# Patient Record
Sex: Male | Born: 1935 | Race: White | Hispanic: No | Smoking: Former smoker
Health system: Southern US, Community
[De-identification: ages and names within clinical notes are randomized; demographics above are authoritative.]

## PROBLEM LIST (undated history)

## (undated) DIAGNOSIS — Z9289 Personal history of other medical treatment: Secondary | ICD-10-CM

## (undated) DIAGNOSIS — M109 Gout, unspecified: Secondary | ICD-10-CM

## (undated) DIAGNOSIS — J189 Pneumonia, unspecified organism: Secondary | ICD-10-CM

## (undated) DIAGNOSIS — I509 Heart failure, unspecified: Secondary | ICD-10-CM

## (undated) DIAGNOSIS — L309 Dermatitis, unspecified: Secondary | ICD-10-CM

## (undated) DIAGNOSIS — M1990 Unspecified osteoarthritis, unspecified site: Secondary | ICD-10-CM

## (undated) DIAGNOSIS — D649 Anemia, unspecified: Secondary | ICD-10-CM

## (undated) DIAGNOSIS — E7800 Pure hypercholesterolemia, unspecified: Secondary | ICD-10-CM

## (undated) DIAGNOSIS — Z860100 Personal history of colon polyps, unspecified: Secondary | ICD-10-CM

## (undated) DIAGNOSIS — I82409 Acute embolism and thrombosis of unspecified deep veins of unspecified lower extremity: Secondary | ICD-10-CM

## (undated) DIAGNOSIS — E559 Vitamin D deficiency, unspecified: Secondary | ICD-10-CM

## (undated) DIAGNOSIS — R351 Nocturia: Secondary | ICD-10-CM

## (undated) DIAGNOSIS — R001 Bradycardia, unspecified: Secondary | ICD-10-CM

## (undated) DIAGNOSIS — I81 Portal vein thrombosis: Secondary | ICD-10-CM

## (undated) DIAGNOSIS — R0989 Other specified symptoms and signs involving the circulatory and respiratory systems: Secondary | ICD-10-CM

## (undated) DIAGNOSIS — M170 Bilateral primary osteoarthritis of knee: Secondary | ICD-10-CM

## (undated) DIAGNOSIS — Z87438 Personal history of other diseases of male genital organs: Secondary | ICD-10-CM

## (undated) DIAGNOSIS — I776 Arteritis, unspecified: Secondary | ICD-10-CM

## (undated) DIAGNOSIS — A809 Acute poliomyelitis, unspecified: Secondary | ICD-10-CM

## (undated) DIAGNOSIS — N200 Calculus of kidney: Secondary | ICD-10-CM

## (undated) DIAGNOSIS — Z8639 Personal history of other endocrine, nutritional and metabolic disease: Secondary | ICD-10-CM

## (undated) DIAGNOSIS — D509 Iron deficiency anemia, unspecified: Secondary | ICD-10-CM

## (undated) DIAGNOSIS — IMO0001 Reserved for inherently not codable concepts without codable children: Secondary | ICD-10-CM

## (undated) DIAGNOSIS — J159 Unspecified bacterial pneumonia: Secondary | ICD-10-CM

## (undated) DIAGNOSIS — I809 Phlebitis and thrombophlebitis of unspecified site: Secondary | ICD-10-CM

## (undated) DIAGNOSIS — I10 Essential (primary) hypertension: Secondary | ICD-10-CM

## (undated) DIAGNOSIS — R161 Splenomegaly, not elsewhere classified: Secondary | ICD-10-CM

## (undated) DIAGNOSIS — G629 Polyneuropathy, unspecified: Secondary | ICD-10-CM

## (undated) DIAGNOSIS — Z8719 Personal history of other diseases of the digestive system: Secondary | ICD-10-CM

## (undated) DIAGNOSIS — C170 Malignant neoplasm of duodenum: Secondary | ICD-10-CM

## (undated) DIAGNOSIS — R3915 Urgency of urination: Secondary | ICD-10-CM

## (undated) DIAGNOSIS — D693 Immune thrombocytopenic purpura: Secondary | ICD-10-CM

## (undated) DIAGNOSIS — R269 Unspecified abnormalities of gait and mobility: Secondary | ICD-10-CM

## (undated) DIAGNOSIS — M545 Low back pain, unspecified: Secondary | ICD-10-CM

## (undated) DIAGNOSIS — G8929 Other chronic pain: Secondary | ICD-10-CM

## (undated) DIAGNOSIS — K219 Gastro-esophageal reflux disease without esophagitis: Secondary | ICD-10-CM

## (undated) DIAGNOSIS — C4320 Malignant melanoma of unspecified ear and external auricular canal: Secondary | ICD-10-CM

## (undated) DIAGNOSIS — N323 Diverticulum of bladder: Secondary | ICD-10-CM

## (undated) DIAGNOSIS — R233 Spontaneous ecchymoses: Secondary | ICD-10-CM

## (undated) DIAGNOSIS — R32 Unspecified urinary incontinence: Secondary | ICD-10-CM

## (undated) DIAGNOSIS — N451 Epididymitis: Secondary | ICD-10-CM

## (undated) DIAGNOSIS — R238 Other skin changes: Secondary | ICD-10-CM

## (undated) DIAGNOSIS — I8001 Phlebitis and thrombophlebitis of superficial vessels of right lower extremity: Secondary | ICD-10-CM

## (undated) DIAGNOSIS — R0602 Shortness of breath: Secondary | ICD-10-CM

## (undated) DIAGNOSIS — R350 Frequency of micturition: Secondary | ICD-10-CM

## (undated) DIAGNOSIS — K068 Other specified disorders of gingiva and edentulous alveolar ridge: Secondary | ICD-10-CM

## (undated) DIAGNOSIS — I8500 Esophageal varices without bleeding: Secondary | ICD-10-CM

## (undated) HISTORY — DX: Personal history of other endocrine, nutritional and metabolic disease: Z86.39

## (undated) HISTORY — DX: Morbid (severe) obesity due to excess calories: E66.01

## (undated) HISTORY — DX: Vitamin D deficiency, unspecified: E55.9

## (undated) HISTORY — DX: Low back pain, unspecified: M54.50

## (undated) HISTORY — DX: Personal history of colonic polyps: Z86.010

## (undated) HISTORY — DX: Other specified disorders of gingiva and edentulous alveolar ridge: K06.8

## (undated) HISTORY — DX: Malignant neoplasm of duodenum: C17.0

## (undated) HISTORY — PX: CATARACT EXTRACTION W/ INTRAOCULAR LENS  IMPLANT, BILATERAL: SHX1307

## (undated) HISTORY — DX: Bradycardia, unspecified: R00.1

## (undated) HISTORY — DX: Personal history of colon polyps, unspecified: Z86.0100

## (undated) HISTORY — DX: Other specified symptoms and signs involving the circulatory and respiratory systems: R09.89

## (undated) HISTORY — DX: Reserved for inherently not codable concepts without codable children: IMO0001

## (undated) HISTORY — DX: Phlebitis and thrombophlebitis of unspecified site: I80.9

## (undated) HISTORY — PX: OTHER SURGICAL HISTORY: SHX169

## (undated) HISTORY — DX: Bilateral primary osteoarthritis of knee: M17.0

## (undated) HISTORY — DX: Arteritis, unspecified: I77.6

## (undated) HISTORY — DX: Epididymitis: N45.1

## (undated) HISTORY — DX: Unspecified abnormalities of gait and mobility: R26.9

## (undated) HISTORY — DX: Essential (primary) hypertension: I10

## (undated) HISTORY — DX: Pure hypercholesterolemia, unspecified: E78.00

## (undated) HISTORY — DX: Unspecified bacterial pneumonia: J15.9

## (undated) HISTORY — DX: Dermatitis, unspecified: L30.9

## (undated) HISTORY — DX: Personal history of other diseases of male genital organs: Z87.438

## (undated) HISTORY — DX: Anemia, unspecified: D64.9

## (undated) HISTORY — DX: Diverticulum of bladder: N32.3

## (undated) HISTORY — DX: Splenomegaly, not elsewhere classified: R16.1

## (undated) HISTORY — DX: Acute embolism and thrombosis of unspecified deep veins of unspecified lower extremity: I82.409

## (undated) HISTORY — DX: Other chronic pain: G89.29

## (undated) HISTORY — DX: Calculus of kidney: N20.0

## (undated) HISTORY — DX: Immune thrombocytopenic purpura: D69.3

## (undated) HISTORY — PX: COLONOSCOPY: SHX174

## (undated) HISTORY — DX: Gastro-esophageal reflux disease without esophagitis: K21.9

## (undated) HISTORY — DX: Low back pain: M54.5

## (undated) HISTORY — PX: SURGERY SCROTAL / TESTICULAR: SUR1316

## (undated) HISTORY — DX: Malignant melanoma of unspecified ear and external auricular canal: C43.20

---
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
## 1957-12-23 HISTORY — PX: INGUINAL HERNIA REPAIR: SUR1180

## 1969-08-23 HISTORY — PX: INGUINAL HERNIA REPAIR: SUR1180

## 1975-12-24 HISTORY — PX: OTHER SURGICAL HISTORY: SHX169

## 1994-12-23 HISTORY — PX: OTHER SURGICAL HISTORY: SHX169

## 1997-08-24 ENCOUNTER — Encounter: Payer: Self-pay | Admitting: Family Medicine

## 1998-03-23 HISTORY — PX: OTHER SURGICAL HISTORY: SHX169

## 1998-11-22 ENCOUNTER — Encounter: Payer: Self-pay | Admitting: Family Medicine

## 1999-03-24 HISTORY — PX: OTHER SURGICAL HISTORY: SHX169

## 1999-08-24 ENCOUNTER — Encounter: Payer: Self-pay | Admitting: Family Medicine

## 1999-08-24 LAB — CONVERTED CEMR LAB: PSA: 2.4 ng/mL

## 2000-02-07 ENCOUNTER — Encounter: Admission: RE | Admit: 2000-02-07 | Discharge: 2000-02-07 | Payer: Self-pay | Admitting: Surgery

## 2000-02-07 ENCOUNTER — Encounter: Payer: Self-pay | Admitting: Surgery

## 2001-01-23 ENCOUNTER — Encounter: Payer: Self-pay | Admitting: Family Medicine

## 2002-02-20 ENCOUNTER — Encounter: Payer: Self-pay | Admitting: Family Medicine

## 2003-02-21 ENCOUNTER — Encounter: Payer: Self-pay | Admitting: Family Medicine

## 2003-02-21 LAB — CONVERTED CEMR LAB: PSA: 1.7 ng/mL

## 2003-05-01 ENCOUNTER — Emergency Department (HOSPITAL_COMMUNITY): Admission: EM | Admit: 2003-05-01 | Discharge: 2003-05-01 | Payer: Self-pay | Admitting: Emergency Medicine

## 2003-05-24 HISTORY — PX: MELANOMA EXCISION: SHX5266

## 2004-03-23 ENCOUNTER — Encounter: Payer: Self-pay | Admitting: Family Medicine

## 2004-03-23 LAB — CONVERTED CEMR LAB: PSA: 1.3 ng/mL

## 2004-04-23 ENCOUNTER — Encounter: Payer: Self-pay | Admitting: Gastroenterology

## 2004-05-11 HISTORY — PX: OTHER SURGICAL HISTORY: SHX169

## 2004-05-23 ENCOUNTER — Ambulatory Visit (HOSPITAL_COMMUNITY): Admission: RE | Admit: 2004-05-23 | Discharge: 2004-05-23 | Payer: Self-pay | Admitting: *Deleted

## 2004-05-23 HISTORY — PX: CARDIAC CATHETERIZATION: SHX172

## 2004-06-21 ENCOUNTER — Ambulatory Visit: Admission: RE | Admit: 2004-06-21 | Discharge: 2004-06-21 | Payer: Self-pay | Admitting: Pulmonary Disease

## 2004-11-01 ENCOUNTER — Ambulatory Visit: Payer: Self-pay | Admitting: Pulmonary Disease

## 2004-11-20 ENCOUNTER — Ambulatory Visit: Payer: Self-pay | Admitting: Cardiology

## 2004-12-11 ENCOUNTER — Ambulatory Visit: Payer: Self-pay | Admitting: Pulmonary Disease

## 2004-12-18 ENCOUNTER — Ambulatory Visit: Payer: Self-pay | Admitting: Family Medicine

## 2004-12-20 ENCOUNTER — Ambulatory Visit: Payer: Self-pay | Admitting: Family Medicine

## 2005-03-12 ENCOUNTER — Ambulatory Visit: Payer: Self-pay | Admitting: Pulmonary Disease

## 2005-03-25 ENCOUNTER — Ambulatory Visit: Payer: Self-pay | Admitting: Family Medicine

## 2005-04-22 ENCOUNTER — Encounter: Payer: Self-pay | Admitting: Family Medicine

## 2005-04-22 LAB — CONVERTED CEMR LAB: PSA: 2.12 ng/mL

## 2005-05-01 ENCOUNTER — Ambulatory Visit: Payer: Self-pay | Admitting: Family Medicine

## 2005-05-03 ENCOUNTER — Ambulatory Visit: Payer: Self-pay | Admitting: Family Medicine

## 2005-05-21 ENCOUNTER — Ambulatory Visit: Payer: Self-pay | Admitting: Family Medicine

## 2005-05-21 ENCOUNTER — Ambulatory Visit (HOSPITAL_COMMUNITY): Admission: RE | Admit: 2005-05-21 | Discharge: 2005-05-21 | Payer: Self-pay | Admitting: Family Medicine

## 2005-05-23 ENCOUNTER — Ambulatory Visit (HOSPITAL_COMMUNITY): Admission: RE | Admit: 2005-05-23 | Discharge: 2005-05-23 | Payer: Self-pay | Admitting: Internal Medicine

## 2005-05-28 ENCOUNTER — Ambulatory Visit: Payer: Self-pay | Admitting: Family Medicine

## 2005-06-17 HISTORY — PX: SHOULDER SURGERY: SHX246

## 2005-07-26 ENCOUNTER — Ambulatory Visit: Payer: Self-pay | Admitting: Family Medicine

## 2005-07-31 ENCOUNTER — Ambulatory Visit: Payer: Self-pay | Admitting: Family Medicine

## 2005-10-18 ENCOUNTER — Ambulatory Visit: Payer: Self-pay | Admitting: Family Medicine

## 2005-11-01 ENCOUNTER — Ambulatory Visit: Payer: Self-pay | Admitting: Family Medicine

## 2005-11-04 ENCOUNTER — Ambulatory Visit: Payer: Self-pay | Admitting: Cardiology

## 2006-04-28 ENCOUNTER — Ambulatory Visit: Payer: Self-pay | Admitting: Family Medicine

## 2006-04-28 LAB — CONVERTED CEMR LAB: PSA: 2.46 ng/mL

## 2006-05-02 ENCOUNTER — Ambulatory Visit: Payer: Self-pay | Admitting: Family Medicine

## 2006-05-13 LAB — FECAL OCCULT BLOOD, GUAIAC: Fecal Occult Blood: NEGATIVE

## 2006-05-20 ENCOUNTER — Ambulatory Visit: Payer: Self-pay | Admitting: Family Medicine

## 2006-06-13 ENCOUNTER — Ambulatory Visit: Payer: Self-pay | Admitting: Family Medicine

## 2006-11-03 ENCOUNTER — Ambulatory Visit: Payer: Self-pay | Admitting: Family Medicine

## 2006-11-18 ENCOUNTER — Ambulatory Visit: Payer: Self-pay | Admitting: Cardiology

## 2006-11-26 ENCOUNTER — Ambulatory Visit: Payer: Self-pay

## 2007-04-27 ENCOUNTER — Ambulatory Visit: Payer: Self-pay | Admitting: Family Medicine

## 2007-04-27 DIAGNOSIS — I10 Essential (primary) hypertension: Secondary | ICD-10-CM

## 2007-04-27 LAB — CONVERTED CEMR LAB
ALT: 24 units/L (ref 0–40)
AST: 28 units/L (ref 0–37)
Calcium: 9.1 mg/dL (ref 8.4–10.5)
GFR calc Af Amer: 106 mL/min
HCT: 41.4 % (ref 39.0–52.0)
MCHC: 34.4 g/dL (ref 30.0–36.0)
PSA: 2.41 ng/mL (ref 0.10–4.00)
Platelets: 245 10*3/uL (ref 150–400)
Sodium: 139 meq/L (ref 135–145)
WBC: 6 10*3/uL (ref 4.5–10.5)

## 2007-04-28 ENCOUNTER — Encounter: Payer: Self-pay | Admitting: Family Medicine

## 2007-04-28 DIAGNOSIS — Z862 Personal history of diseases of the blood and blood-forming organs and certain disorders involving the immune mechanism: Secondary | ICD-10-CM | POA: Insufficient documentation

## 2007-04-28 DIAGNOSIS — Z87898 Personal history of other specified conditions: Secondary | ICD-10-CM

## 2007-04-28 DIAGNOSIS — Z8639 Personal history of other endocrine, nutritional and metabolic disease: Secondary | ICD-10-CM

## 2007-05-01 DIAGNOSIS — N200 Calculus of kidney: Secondary | ICD-10-CM

## 2007-05-06 ENCOUNTER — Ambulatory Visit: Payer: Self-pay | Admitting: Family Medicine

## 2007-05-06 DIAGNOSIS — F411 Generalized anxiety disorder: Secondary | ICD-10-CM | POA: Insufficient documentation

## 2007-05-25 ENCOUNTER — Ambulatory Visit: Payer: Self-pay | Admitting: Family Medicine

## 2007-05-26 ENCOUNTER — Encounter (INDEPENDENT_AMBULATORY_CARE_PROVIDER_SITE_OTHER): Payer: Self-pay | Admitting: *Deleted

## 2007-06-19 ENCOUNTER — Encounter: Payer: Self-pay | Admitting: Family Medicine

## 2007-06-19 DIAGNOSIS — M1710 Unilateral primary osteoarthritis, unspecified knee: Secondary | ICD-10-CM | POA: Insufficient documentation

## 2007-06-19 DIAGNOSIS — IMO0002 Reserved for concepts with insufficient information to code with codable children: Secondary | ICD-10-CM | POA: Insufficient documentation

## 2007-06-20 ENCOUNTER — Encounter: Payer: Self-pay | Admitting: Family Medicine

## 2007-06-23 ENCOUNTER — Encounter: Admission: RE | Admit: 2007-06-23 | Discharge: 2007-06-23 | Payer: Self-pay

## 2007-06-30 ENCOUNTER — Encounter: Payer: Self-pay | Admitting: Family Medicine

## 2007-07-02 DIAGNOSIS — N323 Diverticulum of bladder: Secondary | ICD-10-CM

## 2007-07-02 DIAGNOSIS — E559 Vitamin D deficiency, unspecified: Secondary | ICD-10-CM | POA: Insufficient documentation

## 2007-07-27 ENCOUNTER — Encounter: Payer: Self-pay | Admitting: Family Medicine

## 2007-08-12 ENCOUNTER — Ambulatory Visit: Payer: Self-pay | Admitting: Cardiology

## 2007-08-12 ENCOUNTER — Ambulatory Visit: Payer: Self-pay | Admitting: Family Medicine

## 2007-08-12 ENCOUNTER — Telehealth: Payer: Self-pay | Admitting: Family Medicine

## 2007-08-12 ENCOUNTER — Encounter: Admission: RE | Admit: 2007-08-12 | Discharge: 2007-08-12 | Payer: Self-pay | Admitting: Family Medicine

## 2007-08-12 ENCOUNTER — Telehealth (INDEPENDENT_AMBULATORY_CARE_PROVIDER_SITE_OTHER): Payer: Self-pay | Admitting: *Deleted

## 2007-08-12 DIAGNOSIS — IMO0001 Reserved for inherently not codable concepts without codable children: Secondary | ICD-10-CM

## 2007-08-12 LAB — CONVERTED CEMR LAB
BUN: 11 mg/dL (ref 6–23)
Basophils Absolute: 0 10*3/uL (ref 0.0–0.1)
Basophils Relative: 0 % (ref 0.0–1.0)
Calcium: 9.5 mg/dL (ref 8.4–10.5)
Creatinine, Ser: 1 mg/dL (ref 0.4–1.5)
Eosinophils Absolute: 0 10*3/uL (ref 0.0–0.6)
Eosinophils Relative: 0.1 % (ref 0.0–5.0)
GFR calc non Af Amer: 77 mL/min
Glucose, Bld: 122 mg/dL — ABNORMAL HIGH (ref 70–99)
HCT: 40.2 % (ref 39.0–52.0)
Hemoglobin: 14.2 g/dL (ref 13.0–17.0)
Lymphocytes Relative: 8.2 % — ABNORMAL LOW (ref 12.0–46.0)
MCHC: 35.3 g/dL (ref 30.0–36.0)
MCV: 88.5 fL (ref 78.0–100.0)
Monocytes Relative: 11.4 % — ABNORMAL HIGH (ref 3.0–11.0)
Platelets: 242 10*3/uL (ref 150–400)
Potassium: 4.1 meq/L (ref 3.5–5.1)
Sodium: 139 meq/L (ref 135–145)
WBC: 16.9 10*3/uL — ABNORMAL HIGH (ref 4.5–10.5)

## 2007-08-17 ENCOUNTER — Ambulatory Visit: Payer: Self-pay | Admitting: Family Medicine

## 2007-08-24 HISTORY — PX: BRONCHOSCOPY: SUR163

## 2007-08-28 ENCOUNTER — Telehealth (INDEPENDENT_AMBULATORY_CARE_PROVIDER_SITE_OTHER): Payer: Self-pay | Admitting: *Deleted

## 2007-08-29 ENCOUNTER — Ambulatory Visit: Payer: Self-pay | Admitting: Family Medicine

## 2007-08-31 ENCOUNTER — Ambulatory Visit: Payer: Self-pay | Admitting: Family Medicine

## 2007-08-31 LAB — CONVERTED CEMR LAB
AST: 16 units/L (ref 0–37)
Basophils Absolute: 0 10*3/uL (ref 0.0–0.1)
Basophils Relative: 0.6 % (ref 0.0–1.0)
Eosinophils Relative: 1.9 % (ref 0.0–5.0)
Hemoglobin: 14.1 g/dL (ref 13.0–17.0)
MCV: 92.5 fL (ref 78.0–100.0)
Monocytes Relative: 11.7 % — ABNORMAL HIGH (ref 3.0–11.0)
RBC: 4.49 M/uL (ref 4.22–5.81)
RDW: 13 % (ref 11.5–14.6)

## 2007-09-01 ENCOUNTER — Ambulatory Visit: Payer: Self-pay | Admitting: Critical Care Medicine

## 2007-09-03 ENCOUNTER — Ambulatory Visit: Admission: RE | Admit: 2007-09-03 | Discharge: 2007-09-03 | Payer: Self-pay | Admitting: Critical Care Medicine

## 2007-09-03 ENCOUNTER — Ambulatory Visit: Payer: Self-pay | Admitting: Critical Care Medicine

## 2007-09-03 ENCOUNTER — Ambulatory Visit: Payer: Self-pay | Admitting: Internal Medicine

## 2007-09-03 ENCOUNTER — Encounter: Payer: Self-pay | Admitting: Family Medicine

## 2007-09-03 ENCOUNTER — Encounter: Payer: Self-pay | Admitting: Critical Care Medicine

## 2007-09-07 ENCOUNTER — Ambulatory Visit: Payer: Self-pay | Admitting: Family Medicine

## 2007-09-07 ENCOUNTER — Telehealth (INDEPENDENT_AMBULATORY_CARE_PROVIDER_SITE_OTHER): Payer: Self-pay | Admitting: *Deleted

## 2007-09-15 ENCOUNTER — Ambulatory Visit: Payer: Self-pay | Admitting: Critical Care Medicine

## 2007-10-12 ENCOUNTER — Ambulatory Visit: Payer: Self-pay | Admitting: Critical Care Medicine

## 2007-11-10 ENCOUNTER — Ambulatory Visit: Payer: Self-pay | Admitting: Cardiology

## 2008-01-25 ENCOUNTER — Telehealth (INDEPENDENT_AMBULATORY_CARE_PROVIDER_SITE_OTHER): Payer: Self-pay | Admitting: *Deleted

## 2008-06-22 ENCOUNTER — Encounter: Payer: Self-pay | Admitting: Family Medicine

## 2008-08-18 ENCOUNTER — Ambulatory Visit: Payer: Self-pay | Admitting: Family Medicine

## 2008-08-22 ENCOUNTER — Ambulatory Visit: Payer: Self-pay | Admitting: Family Medicine

## 2008-08-22 LAB — CONVERTED CEMR LAB
Alkaline Phosphatase: 71 units/L (ref 39–117)
BUN: 12 mg/dL (ref 6–23)
Chloride: 109 meq/L (ref 96–112)
Direct LDL: 65.1 mg/dL
GFR calc non Af Amer: 87 mL/min
HCT: 40 % (ref 39.0–52.0)
Hemoglobin: 13.9 g/dL (ref 13.0–17.0)
Lymphocytes Relative: 23.2 % (ref 12.0–46.0)
MCV: 91.3 fL (ref 78.0–100.0)
Monocytes Absolute: 0.8 10*3/uL (ref 0.1–1.0)
Neutrophils Relative %: 64.2 % (ref 43.0–77.0)
PSA: 2.46 ng/mL (ref 0.10–4.00)
Platelets: 215 10*3/uL (ref 150–400)
Potassium: 3.8 meq/L (ref 3.5–5.1)
RBC: 4.38 M/uL (ref 4.22–5.81)
TSH: 3.96 microintl units/mL (ref 0.35–5.50)
WBC: 7.6 10*3/uL (ref 4.5–10.5)

## 2008-09-27 ENCOUNTER — Ambulatory Visit: Payer: Self-pay | Admitting: Family Medicine

## 2008-09-28 LAB — CONVERTED CEMR LAB
ALT: 22 units/L (ref 0–53)
AST: 25 units/L (ref 0–37)

## 2008-10-11 ENCOUNTER — Ambulatory Visit: Payer: Self-pay | Admitting: Internal Medicine

## 2008-10-11 ENCOUNTER — Ambulatory Visit: Payer: Self-pay | Admitting: Critical Care Medicine

## 2008-10-25 ENCOUNTER — Encounter: Payer: Self-pay | Admitting: Adult Health

## 2008-10-26 ENCOUNTER — Ambulatory Visit: Payer: Self-pay | Admitting: Cardiology

## 2008-11-09 ENCOUNTER — Ambulatory Visit: Payer: Self-pay | Admitting: Critical Care Medicine

## 2008-11-23 ENCOUNTER — Ambulatory Visit: Payer: Self-pay

## 2008-11-23 ENCOUNTER — Encounter: Payer: Self-pay | Admitting: Family Medicine

## 2008-12-07 ENCOUNTER — Ambulatory Visit: Payer: Self-pay | Admitting: Critical Care Medicine

## 2008-12-12 ENCOUNTER — Encounter: Admission: RE | Admit: 2008-12-12 | Discharge: 2008-12-12 | Payer: Self-pay | Admitting: Neurology

## 2009-01-30 ENCOUNTER — Ambulatory Visit: Payer: Self-pay | Admitting: Family Medicine

## 2009-02-08 ENCOUNTER — Encounter: Payer: Self-pay | Admitting: Family Medicine

## 2009-02-15 ENCOUNTER — Encounter: Payer: Self-pay | Admitting: Family Medicine

## 2009-02-15 DIAGNOSIS — M545 Low back pain: Secondary | ICD-10-CM

## 2009-02-21 ENCOUNTER — Ambulatory Visit: Payer: Self-pay | Admitting: Family Medicine

## 2009-02-27 ENCOUNTER — Encounter: Payer: Self-pay | Admitting: Family Medicine

## 2009-02-27 LAB — CONVERTED CEMR LAB
Direct LDL: 57 mg/dL
LDL Cholesterol: 61 mg/dL (ref 0–99)
Total CHOL/HDL Ratio: 3.1
Triglycerides: 123 mg/dL (ref 0–149)
VLDL: 25 mg/dL (ref 0–40)

## 2009-02-28 ENCOUNTER — Ambulatory Visit: Payer: Self-pay | Admitting: Family Medicine

## 2009-03-07 ENCOUNTER — Encounter (INDEPENDENT_AMBULATORY_CARE_PROVIDER_SITE_OTHER): Payer: Self-pay | Admitting: Orthopedic Surgery

## 2009-03-07 ENCOUNTER — Ambulatory Visit (HOSPITAL_BASED_OUTPATIENT_CLINIC_OR_DEPARTMENT_OTHER): Admission: RE | Admit: 2009-03-07 | Discharge: 2009-03-07 | Payer: Self-pay | Admitting: Orthopedic Surgery

## 2009-03-07 HISTORY — PX: CYST EXCISION: SHX5701

## 2009-03-14 ENCOUNTER — Encounter: Payer: Self-pay | Admitting: Family Medicine

## 2009-03-21 ENCOUNTER — Encounter: Payer: Self-pay | Admitting: Family Medicine

## 2009-03-30 ENCOUNTER — Encounter: Payer: Self-pay | Admitting: Family Medicine

## 2009-04-10 ENCOUNTER — Encounter: Admission: RE | Admit: 2009-04-10 | Discharge: 2009-04-10 | Payer: Self-pay | Admitting: Neurology

## 2009-04-11 ENCOUNTER — Encounter: Payer: Self-pay | Admitting: Family Medicine

## 2009-04-27 ENCOUNTER — Encounter: Payer: Self-pay | Admitting: Family Medicine

## 2009-05-05 ENCOUNTER — Encounter: Payer: Self-pay | Admitting: Family Medicine

## 2009-05-15 ENCOUNTER — Ambulatory Visit: Payer: Self-pay | Admitting: Internal Medicine

## 2009-05-15 ENCOUNTER — Inpatient Hospital Stay (HOSPITAL_COMMUNITY): Admission: EM | Admit: 2009-05-15 | Discharge: 2009-05-18 | Payer: Self-pay | Admitting: Emergency Medicine

## 2009-05-15 ENCOUNTER — Ambulatory Visit: Payer: Self-pay | Admitting: Oncology

## 2009-05-15 ENCOUNTER — Ambulatory Visit: Payer: Self-pay | Admitting: Family Medicine

## 2009-05-15 DIAGNOSIS — I776 Arteritis, unspecified: Secondary | ICD-10-CM

## 2009-05-15 DIAGNOSIS — R161 Splenomegaly, not elsewhere classified: Secondary | ICD-10-CM

## 2009-05-15 DIAGNOSIS — K055 Other periodontal diseases: Secondary | ICD-10-CM

## 2009-05-15 HISTORY — DX: Splenomegaly, not elsewhere classified: R16.1

## 2009-05-15 LAB — CONVERTED CEMR LAB
ANA Titer 1: 1:40 {titer} — ABNORMAL HIGH
Albumin: 3.7 g/dL (ref 3.5–5.2)
Alkaline Phosphatase: 72 units/L (ref 39–117)
Anti Nuclear Antibody(ANA): POSITIVE — AB
BUN: 13 mg/dL (ref 6–23)
CO2: 29 meq/L (ref 19–32)
Chloride: 105 meq/L (ref 96–112)
Creatinine, Ser: 1 mg/dL (ref 0.4–1.5)
Eosinophils Absolute: 0.1 10*3/uL (ref 0.0–0.7)
Eosinophils Relative: 2 % (ref 0.0–5.0)
Glucose, Bld: 108 mg/dL — ABNORMAL HIGH (ref 70–99)
Lymphs Abs: 1.6 10*3/uL (ref 0.7–4.0)
MCHC: 34.3 g/dL (ref 30.0–36.0)
Monocytes Relative: 17.9 % — ABNORMAL HIGH (ref 3.0–12.0)
Neutro Abs: 3.1 10*3/uL (ref 1.4–7.7)
Phosphorus: 3.2 mg/dL (ref 2.3–4.6)
RBC: 4.54 M/uL (ref 4.22–5.81)
RDW: 14 % (ref 11.5–14.6)
Sed Rate: 11 mm/hr (ref 0–22)
Total Bilirubin: 0.7 mg/dL (ref 0.3–1.2)
WBC: 5.8 10*3/uL (ref 4.5–10.5)

## 2009-05-18 ENCOUNTER — Ambulatory Visit: Payer: Self-pay | Admitting: Oncology

## 2009-05-24 ENCOUNTER — Encounter: Payer: Self-pay | Admitting: Family Medicine

## 2009-05-24 ENCOUNTER — Encounter: Payer: Self-pay | Admitting: Cardiovascular Disease

## 2009-05-24 LAB — CBC WITH DIFFERENTIAL/PLATELET
EOS%: 0.1 % (ref 0.0–7.0)
Eosinophils Absolute: 0 10*3/uL (ref 0.0–0.5)
HCT: 40.3 % (ref 38.4–49.9)
HGB: 13.6 g/dL (ref 13.0–17.1)
MCV: 90.7 fL (ref 79.3–98.0)
MONO%: 3.2 % (ref 0.0–14.0)
Platelets: 296 10*3/uL (ref 140–400)
RBC: 4.44 10*6/uL (ref 4.20–5.82)
RDW: 15 % — ABNORMAL HIGH (ref 11.0–14.6)
WBC: 17.2 10*3/uL — ABNORMAL HIGH (ref 4.0–10.3)

## 2009-05-24 LAB — TECHNOLOGIST REVIEW

## 2009-05-24 LAB — BASIC METABOLIC PANEL
CO2: 21 mEq/L (ref 19–32)
Calcium: 8.9 mg/dL (ref 8.4–10.5)
Sodium: 133 mEq/L — ABNORMAL LOW (ref 135–145)

## 2009-05-31 LAB — CBC WITH DIFFERENTIAL/PLATELET
Basophils Absolute: 0 10*3/uL (ref 0.0–0.1)
Eosinophils Absolute: 0 10*3/uL (ref 0.0–0.5)
HCT: 39.7 % (ref 38.4–49.9)
HGB: 13.5 g/dL (ref 13.0–17.1)
MONO#: 0.3 10*3/uL (ref 0.1–0.9)
NEUT%: 92.7 % — ABNORMAL HIGH (ref 39.0–75.0)
WBC: 16 10*3/uL — ABNORMAL HIGH (ref 4.0–10.3)
lymph#: 0.9 10*3/uL (ref 0.9–3.3)

## 2009-06-08 ENCOUNTER — Encounter: Payer: Self-pay | Admitting: Gastroenterology

## 2009-06-08 LAB — CBC WITH DIFFERENTIAL/PLATELET
Basophils Absolute: 0 10*3/uL (ref 0.0–0.1)
EOS%: 0 % (ref 0.0–7.0)
HCT: 41 % (ref 38.4–49.9)
HGB: 14.4 g/dL (ref 13.0–17.1)
MCH: 32.3 pg (ref 27.2–33.4)
MCV: 91.9 fL (ref 79.3–98.0)
MONO%: 2 % (ref 0.0–14.0)
NEUT%: 91.6 % — ABNORMAL HIGH (ref 39.0–75.0)
lymph#: 0.7 10*3/uL — ABNORMAL LOW (ref 0.9–3.3)

## 2009-06-09 ENCOUNTER — Ambulatory Visit: Payer: Self-pay | Admitting: Gastroenterology

## 2009-06-12 ENCOUNTER — Telehealth (INDEPENDENT_AMBULATORY_CARE_PROVIDER_SITE_OTHER): Payer: Self-pay | Admitting: *Deleted

## 2009-06-14 LAB — CBC WITH DIFFERENTIAL/PLATELET
Eosinophils Absolute: 0 10*3/uL (ref 0.0–0.5)
HCT: 41.1 % (ref 38.4–49.9)
LYMPH%: 9.9 % — ABNORMAL LOW (ref 14.0–49.0)
MONO#: 0.2 10*3/uL (ref 0.1–0.9)
NEUT#: 7.7 10*3/uL — ABNORMAL HIGH (ref 1.5–6.5)
Platelets: 165 10*3/uL (ref 140–400)
RBC: 4.47 10*6/uL (ref 4.20–5.82)
WBC: 8.8 10*3/uL (ref 4.0–10.3)

## 2009-06-19 ENCOUNTER — Encounter: Payer: Self-pay | Admitting: Family Medicine

## 2009-06-19 ENCOUNTER — Encounter: Payer: Self-pay | Admitting: Cardiovascular Disease

## 2009-06-19 LAB — CBC WITH DIFFERENTIAL/PLATELET
BASO%: 0 % (ref 0.0–2.0)
Basophils Absolute: 0 10*3/uL (ref 0.0–0.1)
Eosinophils Absolute: 0 10*3/uL (ref 0.0–0.5)
HCT: 41.2 % (ref 38.4–49.9)
LYMPH%: 5 % — ABNORMAL LOW (ref 14.0–49.0)
MCHC: 34.4 g/dL (ref 32.0–36.0)
MONO#: 0.4 10*3/uL (ref 0.1–0.9)
NEUT%: 92.1 % — ABNORMAL HIGH (ref 39.0–75.0)
Platelets: 186 10*3/uL (ref 140–400)
WBC: 13.1 10*3/uL — ABNORMAL HIGH (ref 4.0–10.3)

## 2009-06-20 ENCOUNTER — Ambulatory Visit: Payer: Self-pay | Admitting: Gastroenterology

## 2009-06-21 ENCOUNTER — Ambulatory Visit: Payer: Self-pay | Admitting: Gastroenterology

## 2009-06-27 ENCOUNTER — Ambulatory Visit: Payer: Self-pay | Admitting: Oncology

## 2009-06-27 DIAGNOSIS — K921 Melena: Secondary | ICD-10-CM

## 2009-06-27 LAB — CONVERTED CEMR LAB: Fecal Occult Bld: POSITIVE

## 2009-06-28 ENCOUNTER — Ambulatory Visit: Payer: Self-pay | Admitting: Family Medicine

## 2009-06-28 LAB — CONVERTED CEMR LAB
Calcium: 8.8 mg/dL (ref 8.4–10.5)
Chloride: 106 meq/L (ref 96–112)
GFR calc non Af Amer: 86.52 mL/min (ref >60)
Glucose, Bld: 85 mg/dL (ref 70–99)

## 2009-06-29 ENCOUNTER — Encounter (INDEPENDENT_AMBULATORY_CARE_PROVIDER_SITE_OTHER): Payer: Self-pay | Admitting: *Deleted

## 2009-06-29 LAB — CBC WITH DIFFERENTIAL/PLATELET
Basophils Absolute: 0 10*3/uL (ref 0.0–0.1)
Eosinophils Absolute: 0 10*3/uL (ref 0.0–0.5)
HCT: 40.8 % (ref 38.4–49.9)
HGB: 13.8 g/dL (ref 13.0–17.1)
MCH: 31.3 pg (ref 27.2–33.4)
MONO#: 0.6 10*3/uL (ref 0.1–0.9)
NEUT#: 11.3 10*3/uL — ABNORMAL HIGH (ref 1.5–6.5)
NEUT%: 84.7 % — ABNORMAL HIGH (ref 39.0–75.0)
WBC: 13.3 10*3/uL — ABNORMAL HIGH (ref 4.0–10.3)
lymph#: 1.5 10*3/uL (ref 0.9–3.3)

## 2009-07-04 ENCOUNTER — Ambulatory Visit: Payer: Self-pay | Admitting: Family Medicine

## 2009-07-05 LAB — CBC WITH DIFFERENTIAL/PLATELET
Basophils Absolute: 0 10*3/uL (ref 0.0–0.1)
EOS%: 0 % (ref 0.0–7.0)
Eosinophils Absolute: 0 10*3/uL (ref 0.0–0.5)
HCT: 40.6 % (ref 38.4–49.9)
HGB: 13.9 g/dL (ref 13.0–17.1)
MCH: 31.5 pg (ref 27.2–33.4)
MCV: 92.4 fL (ref 79.3–98.0)
MONO%: 2.7 % (ref 0.0–14.0)
NEUT#: 9.9 10*3/uL — ABNORMAL HIGH (ref 1.5–6.5)
NEUT%: 86 % — ABNORMAL HIGH (ref 39.0–75.0)
Platelets: 185 10*3/uL (ref 140–400)

## 2009-07-12 LAB — CBC WITH DIFFERENTIAL/PLATELET
Basophils Absolute: 0 10*3/uL (ref 0.0–0.1)
EOS%: 0.1 % (ref 0.0–7.0)
Eosinophils Absolute: 0 10*3/uL (ref 0.0–0.5)
LYMPH%: 13.3 % — ABNORMAL LOW (ref 14.0–49.0)
MCH: 31.1 pg (ref 27.2–33.4)
MCV: 92.8 fL (ref 79.3–98.0)
MONO%: 5.1 % (ref 0.0–14.0)
Platelets: 193 10*3/uL (ref 140–400)
RBC: 4.42 10*6/uL (ref 4.20–5.82)
RDW: 16 % — ABNORMAL HIGH (ref 11.0–14.6)

## 2009-07-17 ENCOUNTER — Encounter: Payer: Self-pay | Admitting: Family Medicine

## 2009-07-19 LAB — CBC WITH DIFFERENTIAL/PLATELET
BASO%: 0.3 % (ref 0.0–2.0)
EOS%: 0.6 % (ref 0.0–7.0)
Eosinophils Absolute: 0.1 10*3/uL (ref 0.0–0.5)
LYMPH%: 28.4 % (ref 14.0–49.0)
MCHC: 33.5 g/dL (ref 32.0–36.0)
MCV: 92.8 fL (ref 79.3–98.0)
MONO%: 9.9 % (ref 0.0–14.0)
NEUT#: 5.5 10*3/uL (ref 1.5–6.5)
Platelets: 225 10*3/uL (ref 140–400)
RBC: 4.27 10*6/uL (ref 4.20–5.82)
RDW: 15.9 % — ABNORMAL HIGH (ref 11.0–14.6)
WBC: 9.1 10*3/uL (ref 4.0–10.3)

## 2009-07-28 ENCOUNTER — Encounter: Payer: Self-pay | Admitting: Cardiovascular Disease

## 2009-07-28 ENCOUNTER — Encounter: Payer: Self-pay | Admitting: Gastroenterology

## 2009-07-28 ENCOUNTER — Ambulatory Visit: Payer: Self-pay | Admitting: Oncology

## 2009-07-28 ENCOUNTER — Encounter: Payer: Self-pay | Admitting: Family Medicine

## 2009-07-28 LAB — COMPREHENSIVE METABOLIC PANEL
ALT: 16 U/L (ref 0–53)
AST: 14 U/L (ref 0–37)
Albumin: 3.6 g/dL (ref 3.5–5.2)
Alkaline Phosphatase: 55 U/L (ref 39–117)
Calcium: 8.9 mg/dL (ref 8.4–10.5)
Chloride: 107 mEq/L (ref 96–112)
Potassium: 4 mEq/L (ref 3.5–5.3)
Sodium: 141 mEq/L (ref 135–145)
Total Protein: 5.7 g/dL — ABNORMAL LOW (ref 6.0–8.3)

## 2009-07-28 LAB — LACTATE DEHYDROGENASE: LDH: 230 U/L (ref 94–250)

## 2009-07-28 LAB — CBC WITH DIFFERENTIAL/PLATELET
BASO%: 0.2 % (ref 0.0–2.0)
Basophils Absolute: 0 10*3/uL (ref 0.0–0.1)
EOS%: 0.8 % (ref 0.0–7.0)
HGB: 12.9 g/dL — ABNORMAL LOW (ref 13.0–17.1)
MCH: 31.7 pg (ref 27.2–33.4)
MCHC: 34.3 g/dL (ref 32.0–36.0)
MCV: 92.3 fL (ref 79.3–98.0)
MONO%: 10.4 % (ref 0.0–14.0)
RBC: 4.07 10*6/uL — ABNORMAL LOW (ref 4.20–5.82)
RDW: 15.2 % — ABNORMAL HIGH (ref 11.0–14.6)
lymph#: 1.4 10*3/uL (ref 0.9–3.3)

## 2009-08-01 ENCOUNTER — Encounter: Payer: Self-pay | Admitting: Family Medicine

## 2009-08-02 ENCOUNTER — Encounter: Payer: Self-pay | Admitting: Gastroenterology

## 2009-08-02 ENCOUNTER — Ambulatory Visit: Payer: Self-pay | Admitting: Gastroenterology

## 2009-08-04 ENCOUNTER — Encounter: Payer: Self-pay | Admitting: Gastroenterology

## 2009-08-09 LAB — CBC WITH DIFFERENTIAL/PLATELET
BASO%: 0.3 % (ref 0.0–2.0)
HCT: 39.8 % (ref 38.4–49.9)
MCHC: 34 g/dL (ref 32.0–36.0)
MONO#: 0.9 10*3/uL (ref 0.1–0.9)
NEUT%: 78.4 % — ABNORMAL HIGH (ref 39.0–75.0)
RDW: 14.4 % (ref 11.0–14.6)
WBC: 11.3 10*3/uL — ABNORMAL HIGH (ref 4.0–10.3)
lymph#: 1.4 10*3/uL (ref 0.9–3.3)

## 2009-08-15 ENCOUNTER — Encounter (INDEPENDENT_AMBULATORY_CARE_PROVIDER_SITE_OTHER): Payer: Self-pay | Admitting: Orthopedic Surgery

## 2009-08-15 ENCOUNTER — Ambulatory Visit (HOSPITAL_BASED_OUTPATIENT_CLINIC_OR_DEPARTMENT_OTHER): Admission: RE | Admit: 2009-08-15 | Discharge: 2009-08-15 | Payer: Self-pay | Admitting: Orthopedic Surgery

## 2009-08-15 HISTORY — PX: CYST EXCISION: SHX5701

## 2009-08-23 ENCOUNTER — Encounter: Payer: Self-pay | Admitting: Family Medicine

## 2009-08-23 LAB — CBC WITH DIFFERENTIAL/PLATELET
BASO%: 0.1 % (ref 0.0–2.0)
Basophils Absolute: 0 10e3/uL (ref 0.0–0.1)
EOS%: 0.5 % (ref 0.0–7.0)
Eosinophils Absolute: 0 10e3/uL (ref 0.0–0.5)
HCT: 39.8 % (ref 38.4–49.9)
HGB: 13.6 g/dL (ref 13.0–17.1)
LYMPH%: 14.9 % (ref 14.0–49.0)
MCH: 31.7 pg (ref 27.2–33.4)
MCHC: 34.2 g/dL (ref 32.0–36.0)
MCV: 92.8 fL (ref 79.3–98.0)
MONO#: 0.6 10e3/uL (ref 0.1–0.9)
MONO%: 5.6 % (ref 0.0–14.0)
NEUT#: 7.9 10e3/uL — ABNORMAL HIGH (ref 1.5–6.5)
NEUT%: 78.9 % — ABNORMAL HIGH (ref 39.0–75.0)
Platelets: 229 10e3/uL (ref 140–400)
RBC: 4.28 10e6/uL (ref 4.20–5.82)
RDW: 14.3 % (ref 11.0–14.6)
WBC: 9.9 10e3/uL (ref 4.0–10.3)
lymph#: 1.5 10e3/uL (ref 0.9–3.3)

## 2009-08-24 ENCOUNTER — Ambulatory Visit: Payer: Self-pay | Admitting: Family Medicine

## 2009-08-24 LAB — CONVERTED CEMR LAB
Chloride: 105 meq/L (ref 96–112)
Potassium: 3.9 meq/L (ref 3.5–5.1)
Sodium: 143 meq/L (ref 135–145)

## 2009-08-29 ENCOUNTER — Ambulatory Visit: Payer: Self-pay | Admitting: Family Medicine

## 2009-09-04 ENCOUNTER — Ambulatory Visit: Payer: Self-pay | Admitting: Oncology

## 2009-09-04 DIAGNOSIS — R0989 Other specified symptoms and signs involving the circulatory and respiratory systems: Secondary | ICD-10-CM | POA: Insufficient documentation

## 2009-09-04 LAB — CBC WITH DIFFERENTIAL/PLATELET
BASO%: 0.4 % (ref 0.0–2.0)
EOS%: 2.1 % (ref 0.0–7.0)
HCT: 40.2 % (ref 38.4–49.9)
MCH: 31.8 pg (ref 27.2–33.4)
MCHC: 34.3 g/dL (ref 32.0–36.0)
MCV: 92.8 fL (ref 79.3–98.0)
MONO%: 12.6 % (ref 0.0–14.0)
NEUT%: 58 % (ref 39.0–75.0)
lymph#: 2.1 10*3/uL (ref 0.9–3.3)

## 2009-09-07 ENCOUNTER — Ambulatory Visit: Payer: Self-pay | Admitting: Cardiology

## 2009-09-07 DIAGNOSIS — R609 Edema, unspecified: Secondary | ICD-10-CM

## 2009-09-07 DIAGNOSIS — I2510 Atherosclerotic heart disease of native coronary artery without angina pectoris: Secondary | ICD-10-CM

## 2009-09-14 ENCOUNTER — Ambulatory Visit: Payer: Self-pay | Admitting: Cardiology

## 2009-09-18 ENCOUNTER — Encounter: Payer: Self-pay | Admitting: Family Medicine

## 2009-09-20 LAB — CONVERTED CEMR LAB
BUN: 15 mg/dL (ref 6–23)
CO2: 28 meq/L (ref 19–32)
Chloride: 107 meq/L (ref 96–112)
GFR calc non Af Amer: 86.47 mL/min (ref >60)
Glucose, Bld: 91 mg/dL (ref 70–99)
Potassium: 4.1 meq/L (ref 3.5–5.1)
Sodium: 141 meq/L (ref 135–145)

## 2009-09-25 ENCOUNTER — Telehealth: Payer: Self-pay | Admitting: Cardiology

## 2009-09-27 ENCOUNTER — Encounter: Payer: Self-pay | Admitting: Cardiology

## 2009-09-27 ENCOUNTER — Encounter: Payer: Self-pay | Admitting: Family Medicine

## 2009-09-27 LAB — CBC WITH DIFFERENTIAL/PLATELET
Basophils Absolute: 0 10*3/uL (ref 0.0–0.1)
EOS%: 1.4 % (ref 0.0–7.0)
Eosinophils Absolute: 0.1 10*3/uL (ref 0.0–0.5)
HGB: 14.4 g/dL (ref 13.0–17.1)
LYMPH%: 27.5 % (ref 14.0–49.0)
MCH: 31.6 pg (ref 27.2–33.4)
MCV: 92.1 fL (ref 79.3–98.0)
MONO%: 11.6 % (ref 0.0–14.0)
NEUT#: 4.8 10*3/uL (ref 1.5–6.5)
Platelets: 242 10*3/uL (ref 140–400)
RDW: 13.1 % (ref 11.0–14.6)

## 2009-10-10 ENCOUNTER — Ambulatory Visit: Payer: Self-pay | Admitting: Oncology

## 2009-10-12 ENCOUNTER — Encounter: Payer: Self-pay | Admitting: Family Medicine

## 2009-10-12 LAB — CBC WITH DIFFERENTIAL/PLATELET
Eosinophils Absolute: 0.1 10*3/uL (ref 0.0–0.5)
HCT: 40.9 % (ref 38.4–49.9)
HGB: 13.8 g/dL (ref 13.0–17.1)
LYMPH%: 27.6 % (ref 14.0–49.0)
MONO#: 0.5 10*3/uL (ref 0.1–0.9)
NEUT#: 5.2 10*3/uL (ref 1.5–6.5)
NEUT%: 64.6 % (ref 39.0–75.0)
Platelets: 209 10*3/uL (ref 140–400)
WBC: 8 10*3/uL (ref 4.0–10.3)

## 2009-10-25 ENCOUNTER — Encounter: Payer: Self-pay | Admitting: Cardiology

## 2009-10-25 LAB — CBC WITH DIFFERENTIAL/PLATELET
Basophils Absolute: 0 10*3/uL (ref 0.0–0.1)
EOS%: 1.8 % (ref 0.0–7.0)
Eosinophils Absolute: 0.1 10*3/uL (ref 0.0–0.5)
HCT: 39.7 % (ref 38.4–49.9)
HGB: 13.3 g/dL (ref 13.0–17.1)
MONO#: 0.7 10*3/uL (ref 0.1–0.9)
NEUT#: 4.5 10*3/uL (ref 1.5–6.5)
NEUT%: 59.6 % (ref 39.0–75.0)
RDW: 13.6 % (ref 11.0–14.6)
WBC: 7.6 10*3/uL (ref 4.0–10.3)
lymph#: 2.1 10*3/uL (ref 0.9–3.3)

## 2009-10-31 ENCOUNTER — Ambulatory Visit: Payer: Self-pay | Admitting: Family Medicine

## 2009-11-03 ENCOUNTER — Ambulatory Visit: Payer: Self-pay | Admitting: Cardiology

## 2009-11-03 ENCOUNTER — Telehealth: Payer: Self-pay | Admitting: Cardiology

## 2009-11-07 ENCOUNTER — Encounter: Payer: Self-pay | Admitting: Family Medicine

## 2009-11-07 LAB — CBC WITH DIFFERENTIAL/PLATELET
Basophils Absolute: 0 10*3/uL (ref 0.0–0.1)
EOS%: 1.2 % (ref 0.0–7.0)
Eosinophils Absolute: 0.1 10*3/uL (ref 0.0–0.5)
HCT: 42.8 % (ref 38.4–49.9)
HGB: 14.1 g/dL (ref 13.0–17.1)
MCH: 30.3 pg (ref 27.2–33.4)
MCV: 92 fL (ref 79.3–98.0)
MONO%: 8.6 % (ref 0.0–14.0)
NEUT#: 4.6 10*3/uL (ref 1.5–6.5)
NEUT%: 63.1 % (ref 39.0–75.0)
RDW: 13.6 % (ref 11.0–14.6)

## 2009-11-20 ENCOUNTER — Ambulatory Visit: Payer: Self-pay | Admitting: Oncology

## 2009-11-22 ENCOUNTER — Encounter: Payer: Self-pay | Admitting: Family Medicine

## 2009-11-22 LAB — CBC WITH DIFFERENTIAL/PLATELET
BASO%: 0.3 % (ref 0.0–2.0)
EOS%: 1.8 % (ref 0.0–7.0)
LYMPH%: 27.3 % (ref 14.0–49.0)
MCH: 30.4 pg (ref 27.2–33.4)
MCHC: 33.1 g/dL (ref 32.0–36.0)
MONO#: 0.6 10*3/uL (ref 0.1–0.9)
Platelets: 228 10*3/uL (ref 140–400)
RBC: 4.45 10*6/uL (ref 4.20–5.82)
WBC: 7 10*3/uL (ref 4.0–10.3)

## 2009-11-27 ENCOUNTER — Telehealth: Payer: Self-pay | Admitting: Family Medicine

## 2009-11-27 ENCOUNTER — Ambulatory Visit: Payer: Self-pay | Admitting: Family Medicine

## 2009-11-28 LAB — CONVERTED CEMR LAB
BUN: 9 mg/dL (ref 6–23)
Chloride: 101 meq/L (ref 96–112)
GFR calc non Af Amer: 86.43 mL/min (ref >60)
Glucose, Bld: 98 mg/dL (ref 70–99)
Potassium: 3.9 meq/L (ref 3.5–5.1)

## 2009-11-29 ENCOUNTER — Telehealth: Payer: Self-pay | Admitting: Cardiology

## 2009-12-05 ENCOUNTER — Encounter: Payer: Self-pay | Admitting: Family Medicine

## 2009-12-05 ENCOUNTER — Encounter: Payer: Self-pay | Admitting: Cardiovascular Disease

## 2009-12-05 LAB — CBC WITH DIFFERENTIAL/PLATELET
BASO%: 0.9 % (ref 0.0–2.0)
EOS%: 1.6 % (ref 0.0–7.0)
Eosinophils Absolute: 0.2 10*3/uL (ref 0.0–0.5)
HCT: 40.9 % (ref 38.4–49.9)
MCH: 30.7 pg (ref 27.2–33.4)
MCHC: 33.4 g/dL (ref 32.0–36.0)
MONO#: 0.9 10*3/uL (ref 0.1–0.9)
NEUT#: 6.9 10*3/uL — ABNORMAL HIGH (ref 1.5–6.5)
NEUT%: 65.7 % (ref 39.0–75.0)
RBC: 4.45 10*6/uL (ref 4.20–5.82)
RDW: 14.1 % (ref 11.0–14.6)
WBC: 10.5 10*3/uL — ABNORMAL HIGH (ref 4.0–10.3)
lymph#: 2.4 10*3/uL (ref 0.9–3.3)

## 2009-12-28 ENCOUNTER — Ambulatory Visit: Payer: Self-pay | Admitting: Cardiology

## 2010-01-22 ENCOUNTER — Ambulatory Visit: Payer: Self-pay | Admitting: Family Medicine

## 2010-01-29 ENCOUNTER — Ambulatory Visit: Payer: Self-pay | Admitting: Oncology

## 2010-01-31 ENCOUNTER — Encounter: Payer: Self-pay | Admitting: Family Medicine

## 2010-01-31 LAB — CBC WITH DIFFERENTIAL/PLATELET
Basophils Absolute: 0 10*3/uL (ref 0.0–0.1)
EOS%: 1.3 % (ref 0.0–7.0)
Eosinophils Absolute: 0.1 10*3/uL (ref 0.0–0.5)
HCT: 39.5 % (ref 38.4–49.9)
HGB: 13.3 g/dL (ref 13.0–17.1)
LYMPH%: 27.7 % (ref 14.0–49.0)
MCH: 30.8 pg (ref 27.2–33.4)
MCV: 91.5 fL (ref 79.3–98.0)
MONO%: 9.5 % (ref 0.0–14.0)
NEUT#: 5.1 10*3/uL (ref 1.5–6.5)
NEUT%: 61.1 % (ref 39.0–75.0)
Platelets: 240 10*3/uL (ref 140–400)
RDW: 14.9 % — ABNORMAL HIGH (ref 11.0–14.6)

## 2010-02-22 ENCOUNTER — Ambulatory Visit: Payer: Self-pay | Admitting: Family Medicine

## 2010-02-22 LAB — CONVERTED CEMR LAB
ALT: 19 units/L (ref 0–53)
AST: 22 units/L (ref 0–37)
BUN: 10 mg/dL (ref 6–23)
Basophils Absolute: 0 10*3/uL (ref 0.0–0.1)
Basophils Relative: 0.5 % (ref 0.0–3.0)
Bilirubin, Direct: 0 mg/dL (ref 0.0–0.3)
CO2: 29 meq/L (ref 19–32)
Direct LDL: 65.1 mg/dL
Eosinophils Absolute: 0.2 10*3/uL (ref 0.0–0.7)
Eosinophils Relative: 2.1 % (ref 0.0–5.0)
Glucose, Bld: 94 mg/dL (ref 70–99)
HDL: 48 mg/dL (ref >39.00)
Lymphocytes Relative: 33.3 % (ref 12.0–46.0)
Lymphs Abs: 2.6 10*3/uL (ref 0.7–4.0)
MCHC: 33.3 g/dL (ref 30.0–36.0)
Microalb Creat Ratio: 9.2 mg/g (ref 0.0–30.0)
Microalb, Ur: 1.1 mg/dL (ref 0.0–1.9)
Monocytes Absolute: 1 10*3/uL (ref 0.1–1.0)
Neutro Abs: 3.9 10*3/uL (ref 1.4–7.7)
Potassium: 4.2 meq/L (ref 3.5–5.1)
RDW: 13.9 % (ref 11.5–14.6)
Sodium: 142 meq/L (ref 135–145)
Total Bilirubin: 0.6 mg/dL (ref 0.3–1.2)
Total CHOL/HDL Ratio: 3
Total Protein: 6.8 g/dL (ref 6.0–8.3)
VLDL: 51.2 mg/dL — ABNORMAL HIGH (ref 0.0–40.0)
WBC: 7.7 10*3/uL (ref 4.5–10.5)

## 2010-02-27 ENCOUNTER — Ambulatory Visit: Payer: Self-pay | Admitting: Family Medicine

## 2010-02-27 DIAGNOSIS — J392 Other diseases of pharynx: Secondary | ICD-10-CM

## 2010-02-27 LAB — HM DIABETES FOOT EXAM

## 2010-03-26 ENCOUNTER — Ambulatory Visit: Payer: Self-pay | Admitting: Oncology

## 2010-03-28 ENCOUNTER — Encounter: Payer: Self-pay | Admitting: Family Medicine

## 2010-03-28 LAB — CBC WITH DIFFERENTIAL/PLATELET
Basophils Absolute: 0 10*3/uL (ref 0.0–0.1)
EOS%: 1.3 % (ref 0.0–7.0)
Eosinophils Absolute: 0.1 10*3/uL (ref 0.0–0.5)
HCT: 39.4 % (ref 38.4–49.9)
HGB: 13.6 g/dL (ref 13.0–17.1)
MCH: 31.3 pg (ref 27.2–33.4)
MONO#: 0.7 10*3/uL (ref 0.1–0.9)
NEUT#: 4.5 10*3/uL (ref 1.5–6.5)
NEUT%: 59.6 % (ref 39.0–75.0)
RDW: 14.2 % (ref 11.0–14.6)
lymph#: 2.2 10*3/uL (ref 0.9–3.3)

## 2010-04-04 ENCOUNTER — Encounter: Admission: RE | Admit: 2010-04-04 | Discharge: 2010-04-04 | Payer: Self-pay | Admitting: Orthopedic Surgery

## 2010-04-05 ENCOUNTER — Encounter: Admission: RE | Admit: 2010-04-05 | Discharge: 2010-04-05 | Payer: Self-pay | Admitting: Orthopedic Surgery

## 2010-04-27 ENCOUNTER — Encounter: Payer: Self-pay | Admitting: Family Medicine

## 2010-05-23 ENCOUNTER — Ambulatory Visit: Payer: Self-pay | Admitting: Oncology

## 2010-05-24 LAB — CBC WITH DIFFERENTIAL/PLATELET
Eosinophils Absolute: 0.1 10*3/uL (ref 0.0–0.5)
MONO#: 0.8 10*3/uL (ref 0.1–0.9)
NEUT#: 4.1 10*3/uL (ref 1.5–6.5)
Platelets: 211 10*3/uL (ref 140–400)
RBC: 4.57 10*6/uL (ref 4.20–5.82)
RDW: 14.5 % (ref 11.0–14.6)
WBC: 7.1 10*3/uL (ref 4.0–10.3)
lymph#: 2.1 10*3/uL (ref 0.9–3.3)

## 2010-05-28 ENCOUNTER — Ambulatory Visit: Payer: Self-pay | Admitting: Family Medicine

## 2010-06-07 ENCOUNTER — Encounter: Payer: Self-pay | Admitting: Family Medicine

## 2010-06-07 LAB — CBC & DIFF AND RETIC
BASO%: 0.4 % (ref 0.0–2.0)
EOS%: 1 % (ref 0.0–7.0)
Immature Retic Fract: 12.1 % (ref 0.00–13.40)
MCH: 30.4 pg (ref 27.2–33.4)
MCHC: 33.3 g/dL (ref 32.0–36.0)
MONO#: 0.9 10*3/uL (ref 0.1–0.9)
RBC: 4.48 10*6/uL (ref 4.20–5.82)
Retic Ct Abs: 58.24 10*3/uL (ref 24.10–77.50)
WBC: 7 10*3/uL (ref 4.0–10.3)
lymph#: 2 10*3/uL (ref 0.9–3.3)
nRBC: 0 % (ref 0–0)

## 2010-06-07 LAB — MORPHOLOGY: PLT EST: ADEQUATE

## 2010-06-07 LAB — COMPREHENSIVE METABOLIC PANEL
ALT: 18 U/L (ref 0–53)
AST: 21 U/L (ref 0–37)
BUN: 12 mg/dL (ref 6–23)
CO2: 21 mEq/L (ref 19–32)
Calcium: 8.9 mg/dL (ref 8.4–10.5)
Creatinine, Ser: 0.84 mg/dL (ref 0.40–1.50)
Total Bilirubin: 0.5 mg/dL (ref 0.3–1.2)

## 2010-06-07 LAB — LACTATE DEHYDROGENASE: LDH: 161 U/L (ref 94–250)

## 2010-06-11 ENCOUNTER — Encounter: Payer: Self-pay | Admitting: Family Medicine

## 2010-07-04 ENCOUNTER — Ambulatory Visit: Payer: Self-pay | Admitting: Cardiology

## 2010-07-13 ENCOUNTER — Ambulatory Visit: Payer: Self-pay | Admitting: Oncology

## 2010-07-17 ENCOUNTER — Encounter: Payer: Self-pay | Admitting: Family Medicine

## 2010-07-17 LAB — CBC WITH DIFFERENTIAL/PLATELET
Eosinophils Absolute: 0.1 10*3/uL (ref 0.0–0.5)
HCT: 40.2 % (ref 38.4–49.9)
LYMPH%: 21.9 % (ref 14.0–49.0)
MONO#: 0.9 10*3/uL (ref 0.1–0.9)
NEUT#: 7 10*3/uL — ABNORMAL HIGH (ref 1.5–6.5)
NEUT%: 68.5 % (ref 39.0–75.0)
Platelets: 250 10*3/uL (ref 140–400)
RBC: 4.41 10*6/uL (ref 4.20–5.82)
WBC: 10.3 10*3/uL (ref 4.0–10.3)
lymph#: 2.2 10*3/uL (ref 0.9–3.3)

## 2010-07-31 ENCOUNTER — Encounter (INDEPENDENT_AMBULATORY_CARE_PROVIDER_SITE_OTHER): Payer: Self-pay | Admitting: *Deleted

## 2010-08-29 ENCOUNTER — Ambulatory Visit: Payer: Self-pay | Admitting: Oncology

## 2010-08-29 LAB — CBC WITH DIFFERENTIAL/PLATELET
BASO%: 0.4 % (ref 0.0–2.0)
Eosinophils Absolute: 0.1 10*3/uL (ref 0.0–0.5)
MONO#: 0.8 10*3/uL (ref 0.1–0.9)
NEUT#: 4.7 10*3/uL (ref 1.5–6.5)
RBC: 4.31 10*6/uL (ref 4.20–5.82)
RDW: 14.5 % (ref 11.0–14.6)
WBC: 7.4 10*3/uL (ref 4.0–10.3)
lymph#: 1.8 10*3/uL (ref 0.9–3.3)

## 2010-09-18 ENCOUNTER — Telehealth (INDEPENDENT_AMBULATORY_CARE_PROVIDER_SITE_OTHER): Payer: Self-pay | Admitting: *Deleted

## 2010-09-19 ENCOUNTER — Ambulatory Visit: Payer: Self-pay | Admitting: Family Medicine

## 2010-09-20 LAB — CONVERTED CEMR LAB
ALT: 20 units/L (ref 0–53)
AST: 26 units/L (ref 0–37)
Alkaline Phosphatase: 79 units/L (ref 39–117)
Basophils Relative: 0.7 % (ref 0.0–3.0)
Chloride: 106 meq/L (ref 96–112)
Eosinophils Absolute: 0.1 10*3/uL (ref 0.0–0.7)
Eosinophils Relative: 1.4 % (ref 0.0–5.0)
GFR calc non Af Amer: 100.25 mL/min (ref >60)
Glucose, Bld: 103 mg/dL — ABNORMAL HIGH (ref 70–99)
Hemoglobin: 13.2 g/dL (ref 13.0–17.0)
LDL Cholesterol: 55 mg/dL (ref 0–99)
Lymphocytes Relative: 29 % (ref 12.0–46.0)
Monocytes Relative: 11 % (ref 3.0–12.0)
Neutrophils Relative %: 57.9 % (ref 43.0–77.0)
Potassium: 4 meq/L (ref 3.5–5.1)
RBC: 4.28 M/uL (ref 4.22–5.81)
Sodium: 142 meq/L (ref 135–145)
Total Bilirubin: 0.8 mg/dL (ref 0.3–1.2)
VLDL: 31.6 mg/dL (ref 0.0–40.0)
WBC: 6.2 10*3/uL (ref 4.5–10.5)

## 2010-09-26 ENCOUNTER — Ambulatory Visit: Payer: Self-pay | Admitting: Family Medicine

## 2010-10-05 ENCOUNTER — Ambulatory Visit: Payer: Self-pay | Admitting: Oncology

## 2010-10-09 LAB — CBC WITH DIFFERENTIAL/PLATELET
BASO%: 0.3 % (ref 0.0–2.0)
EOS%: 1.2 % (ref 0.0–7.0)
Eosinophils Absolute: 0.1 10*3/uL (ref 0.0–0.5)
MCH: 31 pg (ref 27.2–33.4)
MCHC: 34 g/dL (ref 32.0–36.0)
MCV: 91.2 fL (ref 79.3–98.0)
MONO%: 9.8 % (ref 0.0–14.0)
NEUT#: 3.9 10*3/uL (ref 1.5–6.5)
RBC: 4.44 10*6/uL (ref 4.20–5.82)
RDW: 14.3 % (ref 11.0–14.6)

## 2010-10-15 ENCOUNTER — Ambulatory Visit: Payer: Self-pay | Admitting: Critical Care Medicine

## 2010-10-15 ENCOUNTER — Encounter: Payer: Self-pay | Admitting: Critical Care Medicine

## 2010-10-15 DIAGNOSIS — R0989 Other specified symptoms and signs involving the circulatory and respiratory systems: Secondary | ICD-10-CM

## 2010-10-15 DIAGNOSIS — R0609 Other forms of dyspnea: Secondary | ICD-10-CM | POA: Insufficient documentation

## 2010-10-17 ENCOUNTER — Ambulatory Visit: Payer: Self-pay | Admitting: Family Medicine

## 2010-10-24 ENCOUNTER — Ambulatory Visit: Payer: Self-pay | Admitting: Family Medicine

## 2010-10-25 ENCOUNTER — Telehealth (INDEPENDENT_AMBULATORY_CARE_PROVIDER_SITE_OTHER): Payer: Self-pay | Admitting: *Deleted

## 2010-10-25 DIAGNOSIS — L905 Scar conditions and fibrosis of skin: Secondary | ICD-10-CM | POA: Insufficient documentation

## 2010-11-26 ENCOUNTER — Ambulatory Visit: Payer: Self-pay | Admitting: Oncology

## 2010-11-27 ENCOUNTER — Encounter: Payer: Self-pay | Admitting: Cardiology

## 2010-11-27 ENCOUNTER — Encounter: Payer: Self-pay | Admitting: Family Medicine

## 2010-11-27 LAB — CBC & DIFF AND RETIC
Basophils Absolute: 0 10*3/uL (ref 0.0–0.1)
Eosinophils Absolute: 0.1 10*3/uL (ref 0.0–0.5)
HGB: 13.6 g/dL (ref 13.0–17.1)
Immature Retic Fract: 16.3 % — ABNORMAL HIGH (ref 0.00–13.40)
LYMPH%: 18.9 % (ref 14.0–49.0)
MCV: 92 fL (ref 79.3–98.0)
MONO#: 1 10*3/uL — ABNORMAL HIGH (ref 0.1–0.9)
MONO%: 8.8 % (ref 0.0–14.0)
NEUT#: 7.8 10*3/uL — ABNORMAL HIGH (ref 1.5–6.5)
Platelets: 207 10*3/uL (ref 140–400)
RBC: 4.5 10*6/uL (ref 4.20–5.82)
Retic %: 1.4 % (ref 0.50–1.60)
WBC: 10.8 10*3/uL — ABNORMAL HIGH (ref 4.0–10.3)
nRBC: 0 % (ref 0–0)

## 2010-11-27 LAB — COMPREHENSIVE METABOLIC PANEL
ALT: 19 U/L (ref 0–53)
Albumin: 3.9 g/dL (ref 3.5–5.2)
Alkaline Phosphatase: 77 U/L (ref 39–117)
CO2: 25 mEq/L (ref 19–32)
Potassium: 4.2 mEq/L (ref 3.5–5.3)
Sodium: 141 mEq/L (ref 135–145)
Total Bilirubin: 0.6 mg/dL (ref 0.3–1.2)
Total Protein: 6.4 g/dL (ref 6.0–8.3)

## 2010-11-27 LAB — MORPHOLOGY

## 2010-11-30 ENCOUNTER — Ambulatory Visit: Payer: Self-pay | Admitting: Family Medicine

## 2011-01-02 ENCOUNTER — Encounter: Payer: Self-pay | Admitting: Cardiology

## 2011-01-02 ENCOUNTER — Ambulatory Visit
Admission: RE | Admit: 2011-01-02 | Discharge: 2011-01-02 | Payer: Self-pay | Source: Home / Self Care | Attending: Cardiology | Admitting: Cardiology

## 2011-01-16 ENCOUNTER — Emergency Department (HOSPITAL_COMMUNITY)
Admission: EM | Admit: 2011-01-16 | Discharge: 2011-01-16 | Payer: Self-pay | Source: Home / Self Care | Admitting: Emergency Medicine

## 2011-01-16 ENCOUNTER — Ambulatory Visit: Admission: RE | Admit: 2011-01-16 | Discharge: 2011-01-16 | Payer: Self-pay | Source: Home / Self Care

## 2011-01-16 ENCOUNTER — Encounter: Payer: Self-pay | Admitting: Cardiovascular Disease

## 2011-01-22 NOTE — Letter (Signed)
Summary: [facility]   [facility]   Imported By: Roderic Ovens 12/26/2009 13:42:07  _____________________________________________________________________  External Attachment:    Type:   Image     Comment:   External Document

## 2011-01-22 NOTE — Assessment & Plan Note (Signed)
Summary: 6 mo f/u ./cy  Medications Added VITAMIN D 1000 UNIT TABS (CHOLECALCIFEROL) 1 tab once daily        Visit Type:  6 mo f/u Primary Provider:  Laurita Quint, MD  CC:  edema....sob....no cp.  History of Present Illness: James [name] returns today for evaluation and management of his coronary disease, difficult to control hypertension, lower extremity edema, hyperlipidemia and obesity.  He is a delightful man who offers no complaints today. He's had no angina or chest pain. He denies orthopnea PND. Edema has been stable. Blood pressure and good.  I reviewed recent blood work which showed his lipids to be at goal except for triglycerides of 256. He remains on Simcor.  Current Medications (verified): 1)  Multivitamins  Tabs (Multiple Vitamin) .... Take One By Mouth Daily 2)  Simcor 1000-20 Mg Xr24h-Tab (Niacin-Simvastatin) .... One Tab By Mouth At Night 3)  Teveten 400 Mg Tabs (Eprosartan Mesylate) .Marland Kitchen.. 1 Two Times A Day 4)  Nitrostat 0.4 Mg  Subl (Nitroglycerin) .... Place 1 Tablet Under Tongue For Chest Pain, Repeat Every 5 Minutes For A Total of 3 Tablets 5)  Metamucil Plus Calcium  Caps (Psyllium-Calcium) .... Daily By Mouth 6)  Aspirin 81 Mg Tbec (Aspirin) .... Take One Tablet By Mouth Daily 7)  Furosemide 40 Mg Tabs (Furosemide) .... 2 Tabs Every Day 8)  Vitamin D 1000 Unit Tabs (Cholecalciferol) .Marland Kitchen.. 1 Tab Once Daily  Allergies: 1)  ! Penicillin V Potassium (Penicillin V Potassium) 2)  ! Vioxx 3)  ! Tramadol Hcl 4)  ! Septra  Past History:  Past Medical History: Last updated: 09/04/2009 BRADYCARDIA, HYPERTENSIVE (ICD-427.89) CAROTID BRUIT, RIGHT (ICD-785.9) HYPERCHOLESTEROLEMIA/ TRIG. (234/293) (ICD-272.0) HYPERTENSION, BENIGN (ICD-401.1) OBESITY, MORBID (ICD-278.01) VASCULITIS (ICD-447.6) IMM THROMBOCYT PURP (DR GRANFORTUNA) (ICD-287.31) DERMATITIS, LEGS (ICD-692.9) HEMOCCULT POSITIVE STOOL (ICD-578.1) BLEEDING GUMS (ICD-523.8) LOW BACK PAIN, CHRONIC  (ICD-724.2) SPECIAL SCREENING MALIGNANT NEOPLASM OF PROSTATE (ICD-V76.44) MYALGIA/MYOSITIS NOS (ICD-729.1) DEFICIENCY, VITAMIN D NOS (ICD-268.9) BLADDER DIVERTICULUM (ICD-596.3) OSTEOARTHRITIS, KNEES, BILATERAL (ICD-715.96) SPECIAL SCREENING MALIGNANT NEOPLASM OF PROSTATE (ICD-V76.44) ANXIETY (ICD-300.00) NEPHROLITHIASIS (ICD-592.0) MELANOMA, MALIGNANT, EAR R (ICD-172.2) GLUCOSE INTOLERANCE, HX OF (ICD-V12.2) THROMBOPHLEBITIS, RIGHT FOREARM (ICD-451.9) BENIGN PROSTATIC HYPERTROPHY, HX OF (ICD-V13.8) COLONIC POLYPS, HX OF (PATTERSON) (ICD-V12.72) GERD VIA UGI (ICD-530.81) EPIDIDYMITIS, LEFT S/P EPIDIMECTOMY (ICD-604.90) PNEUMONIA, BACTERIAL NOS (ICD-482.9) RML resolved.     -spirometry Normal 2009     Past Surgical History: Last updated: 09/04/2009 Excision of mucoid tumor Stress cardiolite , wnl EF 76% 09/15/97 Colonoscopy, polyps  [name]) 1995 ETT Elsie Lincoln) 04/96 Kidney Stone Retrieval 1950s Fem Cutaneous Nerve Entrapment due to Surgery (mild lat anesth of bilat legs)  Herniorraphy x2 R Appendectomy Limited pelvis/hip bone scan, negative 04/99 Bone scan negative 04/00 Colonoscopy , no polyps 04/00 Cataracts left eye 12/03, right eye 02/04 Wide local excision right ear, melanoma with flap 06/04 Colonoscopy polyp benign 04/23/2004      5 yrs Stress cardiolite, mild inf. ischemia  EF 71% 05/11/04 Cath. mild plaque-statin 05/23/04 PFT wnl 08/09/04 Holter wnl 11/01/04 L Shoulder Surg 06/17/2005 ETT Myoview wnl 11/26/06 Bronchoscopy RML collapse with chronic pneumonia  bx neg. (Dr Delford Field) 9/08 Adenosine Cardiolite Nml  11/23/08 R Middle Finger DIP Joint Mucoid Cyst Excision (Dr Merlyn Lot) 03/07/09 HOSP ITP Severe Thrombocytopenia (Dr Cyndie Chime) 5/24-5/27/10 Abd U/S  NML No  Splenomegaly 05/15/09 Eyelid & Eyebrow lift "Onion Peele" left testical Colonoscopy Polyps Multip O/W nml. (Dr [name]) 08/02/09   no repeat req'd R Middle Finger DIP Joint Mucoid Ctyst Reexcision (Dr  Merlyn Lot) 08/15/09  Family History:  Last updated: 09/04/2009 Father: Died at age 22, MI, DM Mother: Died at 27 cancer ? thyroid Siblings: 5 brothers,6 sisters                1 Brother 43 dec Cerebral Hemm    Family History of Colon Cancer: Brother (questionable)  Social History: Last updated: 06/20/2009 Marital Status: Married Children: 2,  out of home Occupation: Retired,  1996 [employer] quit smoking in 1968 no caffeine states uses smokeless tobacco Alcohol Use - yes-rare Patient does not get regular exercise.   Risk Factors: Alcohol Use: <1 (02/27/2010) Caffeine Use: 1 (02/27/2010) Exercise: yes (02/27/2010)  Risk Factors: Smoking Status: quit (02/27/2010) Passive Smoke Exposure: no (02/27/2010)  Review of Systems       negative other than history of present illness  Vital Signs:  Patient profile:   77 year old male Height:      71 inches Weight:      280 pounds BMI:     39.19 Pulse rate:   65 / minute Pulse rhythm:   regular BP sitting:   118 / 52  (left arm) Cuff size:   large  Vitals Entered By: Danielle Rankin, CMA (July 04, 2010 10:29 AM)  Physical Exam  General:  obese.  no acute distress Head:  normocephalic and atraumatic Neck:  Neck supple, no JVD. No masses, thyromegaly or abnormal cervical nodes. Chest James [name]:  no deformities or breast masses noted Lungs:  Clear bilaterally to auscultation and percussion. Heart:  PMI not displaced, normal S1-S2, no gallop. Soft right carotid bruit Msk:  decreased ROM.   Pulses:  pulses normal in all 4 extremities Extremities:  1+ left pedal edema and 1+ right pedal edema.   Neurologic:  Alert and oriented x 3. Skin:  Intact without lesions or rashes. Psych:  Normal affect.   EKG  Procedure date:  07/04/2010  Findings:      normal sinus rhythm, normal EKG  Impression & Recommendations:  Problem # 1:  CAD, NATIVE VESSEL (ICD-414.01) Assessment Unchanged  His updated medication list for this  problem includes:    Nitrostat 0.4 Mg Subl (Nitroglycerin) .Marland Kitchen... Place 1 tablet under tongue for chest pain, repeat every 5 minutes for a total of 3 tablets    Aspirin 81 Mg Tbec (Aspirin) .Marland Kitchen... Take one tablet by mouth daily  Orders: EKG w/ Interpretation (93000)  Problem # 2:  EDEMA (ICD-782.3) Assessment: Improved  Problem # 3:  CAROTID BRUIT, RIGHT (ICD-785.9) Assessment: Unchanged  Problem # 4:  HYPERCHOLESTEROLEMIA/ TRIG. (234/293) (ICD-272.0) Assessment: Improved  His updated medication list for this problem includes:    Simcor 1000-20 Mg Xr24h-tab (Niacin-simvastatin) ..... One tab by mouth at night  Problem # 5:  HYPERTENSION, BENIGN (ICD-401.1) Assessment: Improved  His updated medication list for this problem includes:    Teveten 400 Mg Tabs (Eprosartan mesylate) .Marland Kitchen... 1 two times a day    Aspirin 81 Mg Tbec (Aspirin) .Marland Kitchen... Take one tablet by mouth daily    Furosemide 40 Mg Tabs (Furosemide) .Marland Kitchen... 2 tabs every day  Orders: EKG w/ Interpretation (93000)  Problem # 6:  OBESITY, MORBID (ICD-278.01) Assessment: Unchanged  Patient Instructions: 1)  Your physician recommends that you schedule a follow-up appointment in: 6 months with Dr. Daleen Squibb 2)  Your physician recommends that you continue on your current medications as directed. Please refer to the Current Medication list given to you today.

## 2011-01-22 NOTE — Consult Note (Signed)
Summary: Consultation Report  Consultation Report   Imported By: Eleonore Chiquito 07/28/2007 12:20:06  _____________________________________________________________________  External Attachment:    Type:   Image     Comment:   External Document

## 2011-01-22 NOTE — Consult Note (Signed)
Summary: Dr.Jefry Rosen,[facility],Note  Dr.Jefry Rosen,[facility],Note   Imported By: Beau Fanny 02/27/2010 11:32:19  _____________________________________________________________________  External Attachment:    Type:   Image     Comment:   External Document

## 2011-01-22 NOTE — Letter (Signed)
Summary: Dr.Charles K.Willis,[facility]Note  Dr.Charles K.Willis,[facility]Note   Imported By: Beau Fanny 04/30/2010 15:22:43  _____________________________________________________________________  External Attachment:    Type:   Image     Comment:   External Document

## 2011-01-22 NOTE — Letter (Signed)
[facility]   Imported By: Lanelle Bal 07/19/2010 08:55:52  _____________________________________________________________________  External Attachment:    Type:   Image     Comment:   External Document  Appended Document: [facility]     Clinical Lists Changes  Observations: Added new observation of PAST MED HX: BRADYCARDIA, HYPERTENSIVE (ICD-427.89) CAROTID BRUIT, RIGHT (ICD-785.9) HYPERCHOLESTEROLEMIA/ TRIG. (234/293) (ICD-272.0) HYPERTENSION, BENIGN (ICD-401.1) OBESITY, MORBID (ICD-278.01) VASCULITIS (ICD-447.6) IMM THROMBOCYT PURP (DR GRANFORTUNA) (ICD-287.31) DERMATITIS, LEGS (ICD-692.9) HEMOCCULT POSITIVE STOOL (ICD-578.1) BLEEDING GUMS (ICD-523.8) LOW BACK PAIN, CHRONIC (ICD-724.2) SPECIAL SCREENING MALIGNANT NEOPLASM OF PROSTATE (ICD-V76.44) MYALGIA/MYOSITIS NOS (ICD-729.1) DEFICIENCY, VITAMIN D NOS (ICD-268.9) BLADDER DIVERTICULUM (ICD-596.3) OSTEOARTHRITIS, KNEES, BILATERAL (ICD-715.96) SPECIAL SCREENING MALIGNANT NEOPLASM OF PROSTATE (ICD-V76.44) ANXIETY (ICD-300.00) NEPHROLITHIASIS (ICD-592.0) MELANOMA, MALIGNANT, EAR R (ICD-172.2) GLUCOSE INTOLERANCE, HX OF (ICD-V12.2) THROMBOPHLEBITIS, RIGHT FOREARM (ICD-451.9) BENIGN PROSTATIC HYPERTROPHY, HX OF (ICD-V13.8) COLONIC POLYPS, HX OF (PATTERSON) (ICD-V12.72) GERD VIA UGI (ICD-530.81) EPIDIDYMITIS, LEFT S/P EPIDIMECTOMY (ICD-604.90) PNEUMONIA, BACTERIAL NOS (ICD-482.9) RML resolved.     -spirometry Normal 2009  (07/20/2010 15:33)       Past History:  Past Medical History: BRADYCARDIA, HYPERTENSIVE (ICD-427.89) CAROTID BRUIT, RIGHT (ICD-785.9) HYPERCHOLESTEROLEMIA/ TRIG. (234/293) (ICD-272.0) HYPERTENSION, BENIGN (ICD-401.1) OBESITY, MORBID (ICD-278.01) VASCULITIS (ICD-447.6) IMM THROMBOCYT PURP (DR GRANFORTUNA) (ICD-287.31) DERMATITIS, LEGS (ICD-692.9) HEMOCCULT POSITIVE STOOL (ICD-578.1) BLEEDING GUMS (ICD-523.8) LOW BACK PAIN,  CHRONIC (ICD-724.2) SPECIAL SCREENING MALIGNANT NEOPLASM OF PROSTATE (ICD-V76.44) MYALGIA/MYOSITIS NOS (ICD-729.1) DEFICIENCY, VITAMIN D NOS (ICD-268.9) BLADDER DIVERTICULUM (ICD-596.3) OSTEOARTHRITIS, KNEES, BILATERAL (ICD-715.96) SPECIAL SCREENING MALIGNANT NEOPLASM OF PROSTATE (ICD-V76.44) ANXIETY (ICD-300.00) NEPHROLITHIASIS (ICD-592.0) MELANOMA, MALIGNANT, EAR R (ICD-172.2) GLUCOSE INTOLERANCE, HX OF (ICD-V12.2) THROMBOPHLEBITIS, RIGHT FOREARM (ICD-451.9) BENIGN PROSTATIC HYPERTROPHY, HX OF (ICD-V13.8) COLONIC POLYPS, HX OF (PATTERSON) (ICD-V12.72) GERD VIA UGI (ICD-530.81) EPIDIDYMITIS, LEFT S/P EPIDIMECTOMY (ICD-604.90) PNEUMONIA, BACTERIAL NOS (ICD-482.9) RML resolved.     -spirometry Normal 2009

## 2011-01-22 NOTE — Assessment & Plan Note (Signed)
Summary: per check out/sf    Visit Type:  2 mo f/u Primary Provider:  Laurita Quint, MD  CC:  edema/ankles..some sob.  History of Present Illness: Mr James [name] returns today for management of his hypertension and lower shin edema.  His blood pressures have trended down to about 140 or less with good diastolics always. He's lost about 5 pounds despite the holidays. His edema has resolved.  Current Medications (verified): 1)  Multivitamins  Tabs (Multiple Vitamin) .... Take One By Mouth Daily 2)  Simcor 1000-20 Mg Xr24h-Tab (Niacin-Simvastatin) .... One Tab By Mouth At Night 3)  Teveten 400 Mg Tabs (Eprosartan Mesylate) .Marland Kitchen.. 1 Two Times A Day 4)  Nitrostat 0.4 Mg  Subl (Nitroglycerin) .... Place 1 Tablet Under Tongue For Chest Pain, Repeat Every 5 Minutes For A Total of 3 Tablets 5)  Metamucil Plus Calcium  Caps (Psyllium-Calcium) .... Daily By Mouth 6)  Aspirin 81 Mg Tbec (Aspirin) .... Take One Tablet By Mouth Daily 7)  Furosemide 40 Mg Tabs (Furosemide) .... 2 Tabs Every Day 8)  Cortisporin 1 % Oint (Bacit-Poly-Neo Hc) .... Apply To Eye Three Times A Day  Allergies: 1)  ! Penicillin V Potassium (Penicillin V Potassium) 2)  ! Vioxx 3)  ! Tramadol Hcl 4)  ! Septra  Past History:  Past Medical History: Last updated: 17-Sep-2009 BRADYCARDIA, HYPERTENSIVE (ICD-427.89) CAROTID BRUIT, RIGHT (ICD-785.9) HYPERCHOLESTEROLEMIA/ TRIG. (234/293) (ICD-272.0) HYPERTENSION, BENIGN (ICD-401.1) OBESITY, MORBID (ICD-278.01) VASCULITIS (ICD-447.6) IMM THROMBOCYT PURP (DR GRANFORTUNA) (ICD-287.31) DERMATITIS, LEGS (ICD-692.9) HEMOCCULT POSITIVE STOOL (ICD-578.1) BLEEDING GUMS (ICD-523.8) LOW BACK PAIN, CHRONIC (ICD-724.2) SPECIAL SCREENING MALIGNANT NEOPLASM OF PROSTATE (ICD-V76.44) MYALGIA/MYOSITIS NOS (ICD-729.1) DEFICIENCY, VITAMIN D NOS (ICD-268.9) BLADDER DIVERTICULUM (ICD-596.3) OSTEOARTHRITIS, KNEES, BILATERAL (ICD-715.96) SPECIAL SCREENING MALIGNANT NEOPLASM OF PROSTATE  (ICD-V76.44) ANXIETY (ICD-300.00) NEPHROLITHIASIS (ICD-592.0) MELANOMA, MALIGNANT, EAR R (ICD-172.2) GLUCOSE INTOLERANCE, HX OF (ICD-V12.2) THROMBOPHLEBITIS, RIGHT FOREARM (ICD-451.9) BENIGN PROSTATIC HYPERTROPHY, HX OF (ICD-V13.8) COLONIC POLYPS, HX OF (PATTERSON) (ICD-V12.72) GERD VIA UGI (ICD-530.81) EPIDIDYMITIS, LEFT S/P EPIDIMECTOMY (ICD-604.90) PNEUMONIA, BACTERIAL NOS (ICD-482.9) RML resolved.     -spirometry Normal 2009     Past Surgical History: Last updated: 09-17-09 Excision of mucoid tumor Stress cardiolite , wnl EF 76% 09/15/97 Colonoscopy, polyps  [name]) 1995 ETT Elsie Lincoln) 04/96 Kidney Stone Retrieval 1950s Fem Cutaneous Nerve Entrapment due to Surgery (mild lat anesth of bilat legs)  Herniorraphy x2 R Appendectomy Limited pelvis/hip bone scan, negative 04/99 Bone scan negative 04/00 Colonoscopy , no polyps 04/00 Cataracts left eye 12/03, right eye 02/04 Wide local excision right ear, melanoma with flap 06/04 Colonoscopy polyp benign 04/23/2004      5 yrs Stress cardiolite, mild inf. ischemia  EF 71% 05/11/04 Cath. mild plaque-statin 05/23/04 PFT wnl 08/09/04 Holter wnl 11/01/04 L Shoulder Surg 06/17/2005 ETT Myoview wnl 11/26/06 Bronchoscopy RML collapse with chronic pneumonia  bx neg. (Dr Delford Field) 9/08 Adenosine Cardiolite Nml  11/23/08 R Middle Finger DIP Joint Mucoid Cyst Excision (Dr Merlyn Lot) 03/07/09 HOSP ITP Severe Thrombocytopenia (Dr Cyndie Chime) 5/24-5/27/10 Abd U/S  NML No  Splenomegaly 05/15/09 Eyelid & Eyebrow lift "Onion Peele" left testical Colonoscopy Polyps Multip O/W nml. (Dr [name]) 08/02/09   no repeat req'd R Middle Finger DIP Joint Mucoid Ctyst Reexcision (Dr Merlyn Lot) 08/15/09  Family History: Last updated: Sep 17, 2009 Father: Died at age 69, MI, DM Mother: Died at 46 cancer ? 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   Risk Factors: Alcohol Use: <1 (05/15/2009) Caffeine Use: 2 (05/15/2009) Exercise: no (06/20/2009)  Risk Factors: Smoking Status: quit (05/15/2009) Passive Smoke Exposure: no (05/15/2009)  Review of Systems       negative other than history of present illness  Vital Signs:  Patient profile:   77 year old male Height:      71 inches Weight:      280 pounds BMI:     39.19 Pulse rate:   68 / minute Pulse rhythm:   regular BP sitting:   128 / 72  (left arm) Cuff size:   large  Vitals Entered By: Danielle Rankin, CMA (December 28, 2009 11:40 AM)  Physical Exam  General:  obese.   Head:  normocephalic and atraumatic Eyes:  PERRLA/EOM intact; conjunctiva and lids normal. Neck:  Neck supple, no JVD. No masses, thyromegaly or abnormal cervical nodes. Lungs:  Clear bilaterally to auscultation and percussion. Heart:  Non-displaced PMI, chest non-tender; regular rate and rhythm, S1, S2 without murmurs, rubs or gallops. Carotid upstroke normal, no bruit. Normal abdominal aortic size, no bruits. Femorals normal pulses, no bruits. Pedals normal pulses. No edema, no varicosities. Msk:  decreased ROM.   Pulses:  pulses normal in all 4 extremities Extremities:  no edema Neurologic:  Alert and oriented x 3. Skin:  Intact without lesions or rashes. Psych:  Normal affect.   Impression & Recommendations:  Problem # 1:  HYPERTENSION, BENIGN (ICD-401.1) Assessment Improved His blood pressure is running on the average about 140 over less than 80. We are certainly trending in the right direction. His edema has resolved. I will make no changes today. Blood pressures excellent in the office today. His updated medication list for this  problem includes:    Teveten 400 Mg Tabs (Eprosartan mesylate) .Marland Kitchen... 1 two times a day    Aspirin 81 Mg Tbec (Aspirin) .Marland Kitchen... Take one tablet by mouth daily    Furosemide 40 Mg Tabs (Furosemide) .Marland Kitchen... 2 tabs every day  Problem # 2:  EDEMA (ICD-782.3) Assessment: Improved  Problem # 3:  CAD, NATIVE VESSEL (ICD-414.01) Assessment: Unchanged  His updated medication list for this problem includes:    Nitrostat 0.4 Mg Subl (Nitroglycerin) .Marland Kitchen... Place 1 tablet under tongue for chest pain, repeat every 5 minutes for a total of 3 tablets    Aspirin 81 Mg Tbec (Aspirin) .Marland Kitchen... Take one tablet by mouth daily  Patient Instructions: 1)  Your physician recommends that you schedule a follow-up appointment in: 6 MONTHS DUE JULY 2011 2)  Your physician recommends that you continue on your current medications as directed. Please refer to the Current Medication list given to you today.

## 2011-01-22 NOTE — Assessment & Plan Note (Signed)
Summary: 12:15 suture removal//lch   Vital Signs:  Patient profile:   77 year old male Weight:      278.75 pounds Temp:     97.5 degrees F oral Pulse rate:   68 / minute Pulse rhythm:   regular BP sitting:   126 / 70  (left arm) Cuff size:   large  Vitals Entered By: Sydell Axon LPN (October 24, 2010 12:35 PM) CC: Suture removal   History of Present Illness: Pt here for suture removal, has done well altho one suture fell out on its own. The skin edges are still well opposed. He has no other complaints.  Problems Prior to Update: 1)  Dyspnea On Exertion  (ICD-786.09) 2)  Tick Bite  (ICD-E906.4) 3)  Other Disease of Pharynx or Nasopharynx  (ICD-478.29) 4)  Eczema of The Breasts  (ICD-692.9) 5)  Encounter For Long-term Use of Other Medications  (ICD-V58.69) 6)  Edema  (ICD-782.3) 7)  Cad, Native Vessel  (ICD-414.01) 8)  Bradycardia, Hypertensive  (ICD-427.89) 9)  Carotid Bruit, Right  (ICD-785.9) 10)  Hypercholesterolemia/ Trig. (234/293)  (ICD-272.0) 11)  Hypertension, Benign  (ICD-401.1) 12)  Obesity, Morbid  (ICD-278.01) 13)  Vasculitis  (ICD-447.6) 14)  Imm Thrombocyt Purp (DR GRANFORTUNA)  (ICD-287.31) 15)  Dermatitis, Legs  (ICD-692.9) 16)  Hemoccult Positive Stool  (ICD-578.1) 17)  Bleeding Gums  (ICD-523.8) 18)  Low Back Pain, Chronic  (ICD-724.2) 19)  Special Screening Malignant Neoplasm of Prostate  (ICD-V76.44) 20)  Myalgia/myositis Nos  (ICD-729.1) 21)  Deficiency, Vitamin D Nos  (ICD-268.9) 22)  Bladder Diverticulum  (ICD-596.3) 23)  Osteoarthritis, Knees, Bilateral  (ICD-715.96) 24)  Screening For Malignannt Neoplasm, Site Nec  (ICD-V76.49) 25)  Anxiety  (ICD-300.00) 26)  Nephrolithiasis  (ICD-592.0) 27)  Melanoma, Malignant, Ear R  (ICD-172.2) 28)  Glucose Intolerance, Hx of  (ICD-V12.2) 29)  Thrombophlebitis, Right Forearm  (ICD-451.9) 30)  Benign Prostatic Hypertrophy, Hx of  (ICD-V13.8) 31)  Colonic Polyps, Hx of (PATTERSON)  (ICD-V12.72) 32)  Gerd  Via Ugi  (ICD-530.81) 33)  Epididymitis, Left S/p Epidimectomy  (ICD-604.90)  Medications Prior to Update: 1)  Multivitamins  Tabs (Multiple Vitamin) .... Take One By Mouth Daily 2)  Simcor 1000-20 Mg Xr24h-Tab (Niacin-Simvastatin) .... One Tab By Mouth At Night 3)  Teveten 400 Mg Tabs (Eprosartan Mesylate) .... 2  Two Times A Day 4)  Nitrostat 0.4 Mg  Subl (Nitroglycerin) .... Place 1 Tablet Under Tongue For Chest Pain, Repeat Every 5 Minutes For A Total of 3 Tablets 5)  Metamucil Plus Calcium  Caps (Psyllium-Calcium) .... Daily By Mouth 6)  Aspirin 81 Mg Tbec (Aspirin) .... Take One Tablet By Mouth Daily 7)  Furosemide 40 Mg Tabs (Furosemide) .... 2 Tabs Every Day 8)  Vitamin D3 2000 Unit Caps (Cholecalciferol) .... Take One By Mouth Daily  Allergies: 1)  ! Penicillin V Potassium (Penicillin V Potassium) 2)  ! Vioxx 3)  ! Tramadol Hcl 4)  ! Septra  Physical Exam  General:  well developed, well nourished, in no acute distress, again obese. Skin:  Right antecubital fossa with some peri-incisional site bruising, o/w skin edges well opposed and two sutures in place. Sutures removed and neosporin and sterile dressing placed.   Impression & Recommendations:  Problem # 1:  ENCOUNTER FOR REMOVAL OF SUTURES (ICD-V58.32) Assessment New  Done. Discussed path report...arthropod bite reaction, no residual parts seen. Reassured.  Orders: No Charge Patient Arrived (NCPA0) (NCPA0)  Problem # 2:  TICK BITE (ICD-E906.4) Assessment: Unchanged Risk  is now formally resolved.  Complete Medication List: 1)  Multivitamins Tabs (Multiple vitamin) .... Take one by mouth daily 2)  Simcor 1000-20 Mg Xr24h-tab (Niacin-simvastatin) .... One tab by mouth at night 3)  Teveten 400 Mg Tabs (Eprosartan mesylate) .... 2  two times a day 4)  Nitrostat 0.4 Mg Subl (Nitroglycerin) .... Place 1 tablet under tongue for chest pain, repeat every 5 minutes for a total of 3 tablets 5)  Metamucil Plus Calcium Caps  (Psyllium-calcium) .... Daily by mouth 6)  Aspirin 81 Mg Tbec (Aspirin) .... Take one tablet by mouth daily 7)  Furosemide 40 Mg Tabs (Furosemide) .... 2 tabs every day 8)  Vitamin D3 2000 Unit Caps (Cholecalciferol) .... Take one by mouth daily  Patient Instructions: 1)  RTC 6/12 for followup, CBC 287.31   Orders Added: 1)  No Charge Patient Arrived (NCPA0) [NCPA0]    Current Allergies (reviewed today): ! PENICILLIN V POTASSIUM (PENICILLIN V POTASSIUM) ! VIOXX ! TRAMADOL HCL ! SEPTRA  Appended Document: 12:15 suture removal//lch     Clinical Lists Changes  Problems: Added new problem of GRANULATION TISSUE, ABNORMAL RIGHT ARM (ICD-701.5)      Appended Document: 12:15 suture removal//lch     Clinical Lists Changes  Problems: Changed problem from GRANULATION TISSUE, ABNORMAL RIGHT ARM (ICD-701.5) to SCAR CONDITION AND FIBROSIS OF SKIN (ICD-709.2)

## 2011-01-22 NOTE — Letter (Signed)
Summary: Dr[facility]James Granfortuna,[facility]  Dr[facility]James Granfortuna,[facility]   Imported By: Beau Fanny 07/18/2010 14:42:27  _____________________________________________________________________  External Attachment:    Type:   Image     Comment:   External Document

## 2011-01-22 NOTE — Progress Notes (Signed)
   Walk in Patient Form Recieved " Pt needs Refill" sent to [facility]  October 25, 2010 1:39 PM     Appended Document:  Walk in Patient Form Recieved " Pt  left paper from [employer]" sent to Eunice Blase this message was taken on 11/22.and placed in paperwork just received this today from [place]

## 2011-01-22 NOTE — Assessment & Plan Note (Signed)
Summary: CPX///CYD   Vital Signs:  Patient profile:   77 year old male Weight:      281.75 pounds Temp:     98.1 degrees F oral Pulse rate:   80 / minute Pulse rhythm:   regular BP sitting:   134 / 70  (left arm) Cuff size:   large  Vitals Entered By: Sydell Axon LPN (February 27, 1609 8:32 AM) CC: 30 Minute checkup, had a colonoscopy 08/10 by Dr. [name]   History of Present Illness: Pt here fotr routine followup. His platelets have held steady since Thrombocytopenia. may have been due to Simcor.  Preventive Screening-Counseling & Management  Alcohol-Tobacco     Alcohol drinks/day: <1     Alcohol type: wine rarely     Smoking Status: quit     Year Quit: 1968     Pack years: 40pyh     Passive Smoke Exposure: no  Caffeine-Diet-Exercise     Caffeine use/day: 1     Does Patient Exercise: yes     Type of exercise: walks some  Allergies: 1)  ! Penicillin V Potassium (Penicillin V Potassium) 2)  ! Vioxx 3)  ! Tramadol Hcl 4)  ! Septra  Social History: Caffeine use/day:  1 Does Patient Exercise:  yes  Review of Systems General:  Denies chills, fatigue, fever, sweats, weakness, and weight loss. Eyes:  Denies blurring, discharge, and eye pain. ENT:  Complains of ringing in ears; denies decreased hearing, ear discharge, and earache; chronic. CV:  Complains of fatigue and shortness of breath with exertion; denies chest pain or discomfort, fainting, and palpitations; due to size. Resp:  Denies cough, shortness of breath, and wheezing. GI:  Denies abdominal pain, bloody stools, change in bowel habits, constipation, dark tarry stools, diarrhea, indigestion, loss of appetite, nausea, vomiting, vomiting blood, and yellowish skin color. GU:  Denies discharge, dysuria, erectile dysfunction, nocturia, and urinary frequency. MS:  Complains of joint pain and low back pain; denies joint swelling, cramps, and stiffness; hands hurt with flexion. left sided low back pain.. Derm:  Denies  dryness, itching, and rash; h/o melanoma.. Neuro:  Denies numbness, poor balance, tingling, and tremors; neuropathy of lower extremities..  Physical Exam  General:  well developed, well nourished, in no acute distress, again obese. Head:  normocephalic and atraumatic, sinuses NT. Eyes:  Conjunctiva clear bilaterally.  Ears:  TMs intact and clear with normal canals Nose:  no deformity, discharge, inflammation, or lesions Mouth:  no deformity or lesions, mild thick PND. Blue papular flat lesion of the posterior pharynx Neck:  no masses, thyromegaly, or abnormal cervical nodes Chest Wall:  no deformities noted Breasts:  No masses but physiologic gynecomastia from obesity. No axillary lymph nodes found. Lungs:  clear bilaterally to auscultation and percussion Heart:  regular rate and rhythm, S1, S2 without murmurs, rubs, gallops, or clicks Abdomen:  Bowel sounds positive,abdomen soft and non-tender without masses, organomegaly or hernias noted. Protuberant. Rectal:  No external abnormalities noted. Normal sphincter tone. No rectal masses or tenderness. G neg Genitalia:  Testes bilaterally descended without nodularity, tenderness or masses. No scrotal masses or lesions. No penis lesions or urethral discharge. Prostate:  Prostate gland firm and smooth, no enlargement, nodularity, tenderness, mass, asymmetry or induration. 40 gms. Msk:  No deformity or scoliosis noted of thoracic or lumbar spine.   Pulses:  R and L carotid,radial,femoral,dorsalis pedis and posterior tibial pulses are full and equal bilaterally Extremities:  Mildly swollen with socjk indentation and mild erythematous macular  rash around the sock tops circumf. Neurologic:  No cranial nerve deficits noted. Station and gait are normal. Sensory, motor and coordinative functions appear intact.  Diabetes Management Exam:    Foot Exam (with socks and/or shoes not present):       Sensory-Pinprick/Light touch:          Left medial foot  (L-4): normal          Left dorsal foot (L-5): normal          Left lateral foot (S-1): normal          Right medial foot (L-4): normal          Right dorsal foot (L-5): normal          Right lateral foot (S-1): normal       Sensory-Monofilament:          Left foot: absent          Right foot: absent       Inspection:          Left foot: normal          Right foot: normal       Nails:          Left foot: normal          Right foot: normal   Impression & Recommendations:  Problem # 1:  ECZEMA OF THE BREASTS (ICD-692.9) Assessment Improved Less bothersome with application of Betamethasone. His updated medication list for this problem includes:    Betamethasone Valerate 0.1 % Crea (Betamethasone valerate) .Marland Kitchen... Apply to area two times a day  Problem # 2:  OTHER DISEASE OF PHARYNX OR NASOPHARYNX (ICD-478.29) Assessment: New What looks like blood blister of the posterior pharynx, about the level of the uvula. Will refer to ENT to eval.  Problem # 3:  EDEMA (ICD-782.3) Assessment: Unchanged Stable. Metabolites ok. His updated medication list for this problem includes:    Furosemide 40 Mg Tabs (Furosemide) .Marland Kitchen... 2 tabs every day  Problem # 4:  HYPERCHOLESTEROLEMIA/ TRIG. (234/293) (ICD-272.0) Assessment: Unchanged Trigs high, otherwise great. Encouraged to avoid sweets and carbs. His updated medication list for this problem includes:    Simcor 1000-20 Mg Xr24h-tab (Niacin-simvastatin) ..... One tab by mouth at night  Labs Reviewed: SGOT: 22 (02/22/2010)   SGPT: 19 (02/22/2010)   HDL:48.00 (02/22/2010), 40.8 (02/21/2009)  LDL:61 (02/21/2009), DEL (96/29/5284)  Chol:142 (02/22/2010), 126 (02/21/2009)  Trig:256.0 (02/22/2010), 123 (02/21/2009)  Problem # 5:  HYPERTENSION, BENIGN (ICD-401.1) Assessment: Unchanged Stable. His updated medication list for this problem includes:    Teveten 400 Mg Tabs (Eprosartan mesylate) .Marland Kitchen... 1 two times a day    Furosemide 40 Mg Tabs  (Furosemide) .Marland Kitchen... 2 tabs every day  BP today: 134/70 Prior BP: 124/64 (01/22/2010)  Labs Reviewed: K+: 4.2 (02/22/2010) Creat: : 0.8 (02/22/2010)   Chol: 142 (02/22/2010)   HDL: 48.00 (02/22/2010)   LDL: 61 (02/21/2009)   TG: 256.0 (02/22/2010)  Problem # 6:  OBESITY, MORBID (ICD-278.01) Assessment: Unchanged  [facility]  Ht: 71 (12/28/2009)   Wt: 281.75 (02/27/2010)   BMI: 39.19 (12/28/2009)  Problem # 7:  DERMATITIS, LEGS (ICD-692.9) Assessment: Improved Wearing socks with less elastic which has helped. His updated medication list for this problem includes:    Betamethasone Valerate 0.1 % Crea (Betamethasone valerate) .Marland Kitchen... Apply to area two times a day  Problem # 8:  DEFICIENCY, VITAMIN D NOS (ICD-268.9) Start Vit D OTC 1000Iu daily.  Recheck in future.  Problem # 9:  GLUCOSE INTOLERANCE, HX OF (ICD-V12.2) Assessment: Unchanged Adequate at present. [place] nml.  Complete Medication List: 1)  Multivitamins Tabs (Multiple vitamin) .... Take one by mouth daily 2)  Simcor 1000-20 Mg Xr24h-tab (Niacin-simvastatin) .... One tab by mouth at night 3)  Teveten 400 Mg Tabs (Eprosartan mesylate) .Marland Kitchen.. 1 two times a day 4)  Nitrostat 0.4 Mg Subl (Nitroglycerin) .... Place 1 tablet under tongue for chest pain, repeat every 5 minutes for a total of 3 tablets 5)  Metamucil Plus Calcium Caps (Psyllium-calcium) .... Daily by mouth 6)  Aspirin 81 Mg Tbec (Aspirin) .... Take one tablet by mouth daily 7)  Furosemide 40 Mg Tabs (Furosemide) .... 2 tabs every day 8)  Betamethasone Valerate 0.1 % Crea (Betamethasone valerate) .... Apply to area two times a day  Patient Instructions: 1)  Refer to ENT. 2)  Check Vit D in  the future. Prescriptions: SIMCOR 1000-20 MG XR24H-TAB (NIACIN-SIMVASTATIN) one tab by mouth at night  #90 x 3   Entered by:   Sydell Axon LPN   Authorized by:   Shaune Leeks MD   Signed by:   Sydell Axon LPN on 16/09/9603    Method used:   Print then Give to Patient   RxID:   [id number]   Current Allergies (reviewed today): ! PENICILLIN V POTASSIUM (PENICILLIN V POTASSIUM) ! VIOXX ! TRAMADOL HCL ! SEPTRA

## 2011-01-22 NOTE — Assessment & Plan Note (Signed)
Summary: CPX/JRR   Vital Signs:  Patient profile:   77 year old male Weight:      280 pounds Temp:     97.0 degrees F oral Pulse rate:   68 / minute Pulse rhythm:   regular BP sitting:   122 / 60  (left arm) Cuff size:   large  Vitals Entered By: Sydell Axon LPN (September 26, 2010 8:36 AM) CC: 30 Minute checkup, had a colonoscopy 08/10 by Dr. [name], [facility]   History of Present Illness: Pt here for yearly checkup. He feels well with one complaint, reactive lesion to tick bite which constantly itches and burns in the right antecubital fossa area from presumed insect bite. No other complaints. He has seen Dr Daleen Squibb and sees oncology at [facility] for melanoma followup.  Preventive Screening-Counseling & Management  Alcohol-Tobacco     Alcohol drinks/day: <1     Alcohol type: wine rarely     Smoking Status: quit     Year Quit: 1968     Pack years: 40pyh     Passive Smoke Exposure: no  Caffeine-Diet-Exercise     Caffeine use/day: 0     Diet Comments: weight continues to challenge     Does Patient Exercise: yes     Type of exercise: is very active but limitred by legs and lungs.  Problems Prior to Update: 1)  Tick Bite  (ICD-E906.4) 2)  Other Disease of Pharynx or Nasopharynx  (ICD-478.29) 3)  Eczema of The Breasts  (ICD-692.9) 4)  Encounter For Long-term Use of Other Medications  (ICD-V58.69) 5)  Edema  (ICD-782.3) 6)  Cad, Native Vessel  (ICD-414.01) 7)  Bradycardia, Hypertensive  (ICD-427.89) 8)  Carotid Bruit, Right  (ICD-785.9) 9)  Hypercholesterolemia/ Trig. (234/293)  (ICD-272.0) 10)  Hypertension, Benign  (ICD-401.1) 11)  Obesity, Morbid  (ICD-278.01) 12)  Vasculitis  (ICD-447.6) 13)  Imm Thrombocyt Purp (DR GRANFORTUNA)  (ICD-287.31) 14)  Dermatitis, Legs  (ICD-692.9) 15)  Hemoccult Positive Stool  (ICD-578.1) 16)  Bleeding Gums  (ICD-523.8) 17)  Low Back Pain, Chronic  (ICD-724.2) 18)  Special Screening Malignant  Neoplasm of Prostate  (ICD-V76.44) 19)  Myalgia/myositis Nos  (ICD-729.1) 20)  Deficiency, Vitamin D Nos  (ICD-268.9) 21)  Bladder Diverticulum  (ICD-596.3) 22)  Osteoarthritis, Knees, Bilateral  (ICD-715.96) 23)  Screening For Malignannt Neoplasm, Site Nec  (ICD-V76.49) 24)  Anxiety  (ICD-300.00) 25)  Nephrolithiasis  (ICD-592.0) 26)  Melanoma, Malignant, Ear R  (ICD-172.2) 27)  Glucose Intolerance, Hx of  (ICD-V12.2) 28)  Thrombophlebitis, Right Forearm  (ICD-451.9) 29)  Benign Prostatic Hypertrophy, Hx of  (ICD-V13.8) 30)  Colonic Polyps, Hx of (PATTERSON)  (ICD-V12.72) 31)  Gerd [address]gi  (ICD-530.81) 32)  Epididymitis, Left S/p Epidimectomy  (ICD-604.90)  Allergies: 1)  ! Penicillin V Potassium (Penicillin V Potassium) 2)  ! Vioxx 3)  ! Tramadol Hcl 4)  ! Septra  Family History: Reviewed history from 09/04/2009 and no changes required. Father: Died at age 38, MI, DM Mother: Died at 69 cancer ? thyroid Siblings: 5 brothers,6 sisters                1 Brother 43 dec Cerebral Hemm    Family History of Colon Cancer: Brother (questionable)  Social History: Caffeine use/day:  0  Review of Systems General:  Denies chills, fatigue, fever, sweats, weakness, and weight loss. Eyes:  Denies blurring and eye pain; had annual eye check with "wrinkles "  on bavck of eyeball. ENT:  Complains of ringing in ears; denies decreased hearing and earache. CV:  Complains of shortness of breath with exertion; denies chest pain or discomfort, fainting, palpitations, swelling of feet, and swelling of hands. Resp:  Complains of shortness of breath; denies cough and wheezing. GI:  Denies abdominal pain, bloody stools, change in bowel habits, constipation, dark tarry stools, diarrhea, indigestion, loss of appetite, nausea, vomiting, vomiting blood, and yellowish skin color. GU:  Complains of nocturia and urinary frequency; denies dysuria; improved. MS:  Denies joint pain, joint swelling, muscle  weakness, and stiffness. Derm:  Denies dryness, itching, and rash; see HPI. Neuro:  Denies numbness, poor balance, tingling, and tremors; hAS PERIPHERAL NEUROPATHY OF LEGS LONGTIME.Marland Kitchen  Physical Exam  General:  well developed, well nourished, in no acute distress, again obese. Head:  normocephalic and atraumatic, sinuses NT. Eyes:  Conjunctiva clear bilaterally.  Ears:  TMs intact and clear with normal canals Nose:  no deformity, discharge, inflammation, or lesions Mouth:  no deformity or lesions, mild thick PND. Blue papular flat lesion of the posterior pharynx Neck:  no masses, thyromegaly, or abnormal cervical nodes Chest Wall:  no deformities noted Breasts:  No masses but physiologic gynecomastia from obesity. No axillary lymph nodes found. Lungs:  clear bilaterally to auscultation and percussion Heart:  regular rate and rhythm, S1, S2 without murmurs, rubs, gallops, or clicks Abdomen:  Bowel sounds positive,abdomen soft and non-tender without masses, organomegaly or hernias noted. Protuberant. Rectal:  No external abnormalities noted. Normal sphincter tone. No rectal masses or tenderness. G neg Genitalia:  Testes bilaterally descended without nodularity, tenderness or masses. No scrotal masses or lesions. No penis lesions or urethral discharge. Prostate:  Prostate gland firm and smooth, no enlargement, nodularity, tenderness, mass, asymmetry or induration. 40 gms. Msk:  No deformity or scoliosis noted of thoracic or lumbar spine.   Pulses:  R and L carotid,radial,femoral,dorsalis pedis and posterior tibial pulses are full and equal bilaterally Extremities:  Mildly swollen with socjk indentation and mild erythematous macular rash around the sock tops circumf. Neurologic:  No cranial nerve deficits noted. Station and gait are normal. Sensory, motor and coordinative functions appear intact. Skin:  Sclerotic nodule located around the prior bite site, more dependent , in the right antecubital  fossa area of the right arm, now with no residual  swelling and full ROM. No real local tenderness.   Impression & Recommendations:  Problem # 1:  TICK BITE (ICD-E906.4) Assessment Unchanged Will schedule for excision to remove probable retained tick part with resulting sclerotic nodule.  Problem # 2:  CAD, NATIVE VESSEL (ICD-414.01) Assessment: Unchanged  Stable, no acute or chronic complaints. His updated medication list for this problem includes:    Teveten 400 Mg Tabs (Eprosartan mesylate) .Marland Kitchen... 2  two times a day    Nitrostat 0.4 Mg Subl (Nitroglycerin) .Marland Kitchen... Place 1 tablet under tongue for chest pain, repeat every 5 minutes for a total of 3 tablets    Aspirin 81 Mg Tbec (Aspirin) .Marland Kitchen... Take one tablet by mouth daily    Furosemide 40 Mg Tabs (Furosemide) .Marland Kitchen... 2 tabs every day  Labs Reviewed: Chol: 130 (09/19/2010)   HDL: 43.90 (09/19/2010)   LDL: 55 (09/19/2010)   TG: 158.0 (09/19/2010)  Problem # 3:  HYPERCHOLESTEROLEMIA/ TRIG. (234/293) (ICD-272.0) Assessment: Unchanged Stable. Cont curr meds. His updated medication list for this problem includes:    Simcor 1000-20 Mg Xr24h-tab (Niacin-simvastatin) ..... One tab by mouth at night  Labs Reviewed: SGOT:  26 (09/19/2010)   SGPT: 20 (09/19/2010)   HDL:43.90 (09/19/2010), 48.00 (02/22/2010)  LDL:55 (09/19/2010), 61 (02/21/2009)  Chol:130 (09/19/2010), 142 (02/22/2010)  Trig:158.0 (09/19/2010), 256.0 (02/22/2010)  Problem # 4:  HYPERTENSION, BENIGN (ICD-401.1) Assessment: Unchanged Stable, cont curr meds. His updated medication list for this problem includes:    Teveten 400 Mg Tabs (Eprosartan mesylate) .Marland Kitchen... 2  two times a day    Furosemide 40 Mg Tabs (Furosemide) .Marland Kitchen... 2 tabs every day  BP today: 122/60 Prior BP: 118/52 (07/04/2010)  Labs Reviewed: K+: 4.0 (09/19/2010) Creat: : 0.8 (09/19/2010)   Chol: 130 (09/19/2010)   HDL: 43.90 (09/19/2010)   LDL: 55 (09/19/2010)   TG: 158.0 (09/19/2010)  Problem # 5:  OBESITY,  MORBID (ICD-278.01) Assessment: Unchanged Unchanged. Ht: 71 (07/04/2010)   Wt: 280 (09/26/2010)   BMI: 39.19 (07/04/2010)  Problem # 6:  SPECIAL SCREENING MALIGNANT NEOPLASM OF PROSTATE (ICD-V76.44) Assessment: Unchanged Last checked in Mar and stable.  Problem # 7:  DEFICIENCY, VITAMIN D NOS (ICD-268.9)  Complete Medication List: 1)  Multivitamins Tabs (Multiple vitamin) .... Take one by mouth daily 2)  Simcor 1000-20 Mg Xr24h-tab (Niacin-simvastatin) .... One tab by mouth at night 3)  Teveten 400 Mg Tabs (Eprosartan mesylate) .... 2  two times a day 4)  Nitrostat 0.4 Mg Subl (Nitroglycerin) .... Place 1 tablet under tongue for chest pain, repeat every 5 minutes for a total of 3 tablets 5)  Metamucil Plus Calcium Caps (Psyllium-calcium) .... Daily by mouth 6)  Aspirin 81 Mg Tbec (Aspirin) .... Take one tablet by mouth daily 7)  Furosemide 40 Mg Tabs (Furosemide) .... 2 tabs every day 8)  Vitamin D3 2000 Unit Caps (Cholecalciferol) .... Take one by mouth daily  Patient Instructions: 1)  RTC for minor surgery 30 mins for lesion excision right antecubital fossa.  Current Allergies (reviewed today): ! PENICILLIN V POTASSIUM (PENICILLIN V POTASSIUM) ! VIOXX ! TRAMADOL HCL ! SEPTRA

## 2011-01-22 NOTE — Assessment & Plan Note (Signed)
Summary: COUGH/CONGESTION/DLO   Vital Signs:  Patient profile:   77 year old male Height:      71 inches Weight:      276.25 pounds BMI:     38.67 Temp:     97.5 degrees F oral Pulse rate:   66 / minute Pulse rhythm:   regular BP sitting:   112 / 70  (left arm) Cuff size:   large CC: cough and congestion   History of Present Illness: CC: cough/congestion  over thanksgiving, granddaughter brough bad cold.  1 wk ago started feeling poorly.  + green sputum from cough and nose, more in morning.  Taking corsidin BP which hasn't helped.  Has been self medicating since then.  Started with RN, congestion, sniffles.  Cough started after.   Cough syrup has helped in past.  + fatigue.  No HA, sinus pressure.  No fevers/chills, abd pain, n/v/d, rashes, myalgias, arthralgias.  Allergic to septra and penicillin.  No smokers at home.  h/o PNA 2009  Current Medications (verified): 1)  Multivitamins  Tabs (Multiple Vitamin) .... Take One By Mouth Daily 2)  Simcor 1000-20 Mg Xr24h-Tab (Niacin-Simvastatin) .... One Tab By Mouth At Night 3)  Teveten 400 Mg Tabs (Eprosartan Mesylate) .... 2  Two Times A Day 4)  Nitrostat 0.4 Mg  Subl (Nitroglycerin) .... Place 1 Tablet Under Tongue For Chest Pain, Repeat Every 5 Minutes For A Total of 3 Tablets 5)  Metamucil Plus Calcium  Caps (Psyllium-Calcium) .... Daily By Mouth 6)  Aspirin 81 Mg Tbec (Aspirin) .... Take One Tablet By Mouth Daily 7)  Furosemide 40 Mg Tabs (Furosemide) .... 2 Tabs Every Day 8)  Vitamin D3 2000 Unit Caps (Cholecalciferol) .... Take One By Mouth Daily  Allergies: 1)  ! Penicillin V Potassium (Penicillin V Potassium) 2)  ! Vioxx 3)  ! Tramadol Hcl 4)  ! Septra  Past History:  Past Medical History: Last updated: 07/20/2010 BRADYCARDIA, HYPERTENSIVE (ICD-427.89) CAROTID BRUIT, RIGHT (ICD-785.9) HYPERCHOLESTEROLEMIA/ TRIG. (234/293) (ICD-272.0) HYPERTENSION, BENIGN (ICD-401.1) OBESITY, MORBID (ICD-278.01) VASCULITIS  (ICD-447.6) IMM THROMBOCYT PURP (DR GRANFORTUNA) (ICD-287.31) DERMATITIS, LEGS (ICD-692.9) HEMOCCULT POSITIVE STOOL (ICD-578.1) BLEEDING GUMS (ICD-523.8) LOW BACK PAIN, CHRONIC (ICD-724.2) SPECIAL SCREENING MALIGNANT NEOPLASM OF PROSTATE (ICD-V76.44) MYALGIA/MYOSITIS NOS (ICD-729.1) DEFICIENCY, VITAMIN D NOS (ICD-268.9) BLADDER DIVERTICULUM (ICD-596.3) OSTEOARTHRITIS, KNEES, BILATERAL (ICD-715.96) SPECIAL SCREENING MALIGNANT NEOPLASM OF PROSTATE (ICD-V76.44) ANXIETY (ICD-300.00) NEPHROLITHIASIS (ICD-592.0) MELANOMA, MALIGNANT, EAR R (ICD-172.2) GLUCOSE INTOLERANCE, HX OF (ICD-V12.2) THROMBOPHLEBITIS, RIGHT FOREARM (ICD-451.9) BENIGN PROSTATIC HYPERTROPHY, HX OF (ICD-V13.8) COLONIC POLYPS, HX OF (PATTERSON) (ICD-V12.72) GERD VIA UGI (ICD-530.81) EPIDIDYMITIS, LEFT S/P EPIDIMECTOMY (ICD-604.90) PNEUMONIA, BACTERIAL NOS (ICD-482.9) RML resolved.     -spirometry Normal 2009   Social History: Last updated: 06/20/2009 Marital Status: Married Children: 2,  out of home Occupation: Retired,  1996 [employer] quit smoking in 1968 no caffeine states uses smokeless tobacco Alcohol Use - yes-rare Patient does not get regular exercise.   Review of Systems       per HPI  Physical Exam  General:  well developed, well nourished, in no acute distress, obese. Head:  normocephalic and atraumatic, sinuses NT. Eyes:  Conjunctiva clear bilaterally.  Ears:  TMs intact and clear with normal canals Nose:  no deformity, discharge, inflammation, or lesions Mouth:  no deformity or lesions. Blue papular flat lesion of the posterior pharynx.  MMM, mild pharyngeal erythema Neck:  no masses, thyromegaly, or abnormal cervical nodes Lungs:  clear bilaterally to auscultation and percussion Heart:  regular rate and  rhythm, S1, S2 without murmurs, rubs, gallops, or clicks Pulses:  2+ rad pulses   Impression & Recommendations:  Problem # 1:  ACUTE BRONCHITIS (ICD-466.0) Assessment  New  Take antibiotics and other medications as directed. Encouraged to push clear liquids, get enough rest, and take acetaminophen as needed. To be seen in 5-7 days if no improvement, sooner if worse.  His updated medication list for this problem includes:    Zithromax Z-pak 250 Mg Tabs (Azithromycin) ..... Use as directed    Cheratussin Ac 100-10 Mg/75ml Syrp (Guaifenesin-codeine) ..... One teaspoon every 6 hours as needed chough, sedation precautions  Complete Medication List: 1)  Multivitamins Tabs (Multiple vitamin) .... Take one by mouth daily 2)  Simcor 1000-20 Mg Xr24h-tab (Niacin-simvastatin) .... One tab by mouth at night 3)  Teveten 400 Mg Tabs (Eprosartan mesylate) .... 2  two times a day 4)  Nitrostat 0.4 Mg Subl (Nitroglycerin) .... Place 1 tablet under tongue for chest pain, repeat every 5 minutes for a total of 3 tablets 5)  Metamucil Plus Calcium Caps (Psyllium-calcium) .... Daily by mouth 6)  Aspirin 81 Mg Tbec (Aspirin) .... Take one tablet by mouth daily 7)  Furosemide 40 Mg Tabs (Furosemide) .... 2 tabs every day 8)  Vitamin D3 2000 Unit Caps (Cholecalciferol) .... Take one by mouth daily 9)  Zithromax Z-pak 250 Mg Tabs (Azithromycin) .... Use as directed 10)  Cheratussin Ac 100-10 Mg/77ml Syrp (Guaifenesin-codeine) .... One teaspoon every 6 hours as needed chough, sedation precautions  Patient Instructions: 1)  Sounds like you have a viral upper respiratory infection could be early bronchitis. 2)  Use medication as prescribed: zpack for antibiotic, cheratussin for cough suppression. 3)  Push fluids and get plenty of rest.  Can use guaifenesin IR 400mg  with plenty of fluid to help mobilize mucous out. 4)  Please return if you are not improving as expected, or if you have high fevers (>101.5) or difficulty swallowing. 5)  Call clinic with questions.  Pleasure to see you today!  Prescriptions: CHERATUSSIN AC 100-10 MG/5ML SYRP (GUAIFENESIN-CODEINE) one teaspoon every 6  hours as needed chough, sedation precautions  #100cc x 0   Entered and Authorized by:   Eustaquio Boyden  MD   Signed by:   Eustaquio Boyden  MD on 11/30/2010   Method used:   Print then Give to Patient   RxID:   [id number] Christena Deem Z-PAK 250 MG TABS (AZITHROMYCIN) use as directed  #1 x 0   Entered and Authorized by:   Eustaquio Boyden  MD   Signed by:   Eustaquio Boyden  MD on 11/30/2010   Method used:   Electronically to        [facility] [id number] (retail)       [address] [place], [place]  [address]       Ph: [phone]       Fax: [phone]   RxID:   [id number]    Orders Added: 1)  Est. Patient Level III [64403]    Current Allergies (reviewed today): ! PENICILLIN V POTASSIUM (PENICILLIN V POTASSIUM) ! VIOXX ! TRAMADOL HCL ! SEPTRA

## 2011-01-22 NOTE — Letter (Signed)
Summary: James [name] letter  [place] [facility]  [address] [place], [place] [address]   Phone: [phone]  Fax: [phone]       07/31/2010 MRN: [id number]  [facility] [address] [place], [place]  [address]  Dear [name],  [facility] [place], and [facility] announce the retirement of Arta Silence, M.D., from full-time practice at the [facility] office effective June 21, 2010 and his plans of returning part-time.  It is important to Dr. Hetty Ely and to our practice that you understand that [facility] has seven physicians in our office for your health care needs.  We will continue to offer the same exceptional care that you have today.    Dr. Hetty Ely has spoken to many of you about his plans for retirement and returning part-time in the fall.   We will continue to work with you through the transition to schedule appointments for you in the office and meet the high standards that [facility] is committed to.   Again, it is with great pleasure that we share the news that Dr. Hetty Ely will return to [facility] in October of 2011 with a reduced schedule.    If you have any questions, or would like to request an appointment with one of our physicians, please call us at [phone] and press the option for Scheduling an appointment.  We take pleasure in providing you with excellent patient care and look forward to seeing you at your next office visit.  Our [facility] Physicians are:  Tillman Abide, M.D. Laurita Quint, M.D. Roxy Manns, M.D. Kerby Nora, M.D. Hannah Beat, M.D. Ruthe Mannan, M.D. We proudly welcomed Raechel Ache, M.D. and Eustaquio Boyden, M.D. to the practice in July/August 2011.  Sincerely,  [facility]

## 2011-01-22 NOTE — Assessment & Plan Note (Signed)
Summary: Pulmonary OV   Primary Provider/Referring Provider:  Laurita Quint, MD  CC:  Follow up, pt has not been seen since 2009, and c/o increase sob with exertion over the last year.  History of Present Illness: 77  year old male with known history of  reflux disease, right middle lobe atelectasis, hx of  pneumonia.  Negative bronchoscopy September 2008.        October 15, 2010 2:59 PM Last seen 2009,  now is more dyspneic.  Had surgery on R finger and rx septra and on last dose of 10days, bleed everywhere and blistering. Low Platlet count  Not much mucus.  Had cyclical cough in the past driven by GERD Cough now is well controlled. Pt wanted a pulm checkup. Pt denies any significant sore throat, nasal congestion or excess secretions, fever, chills, sweats, unintended weight loss, pleurtic or exertional chest pain, orthopnea PND, or leg swelling Pt denies any increase in rescue therapy over baseline, denies waking up needing it or having any early am or nocturnal exacerbations of coughing/wheezing/or dyspnea.    Preventive Screening-Counseling & Management  Alcohol-Tobacco     Smoking Status: quit     Packs/Day: 2.0     Year Started: 1948     Year Quit: 1968  Current Medications (verified): 1)  Multivitamins  Tabs (Multiple Vitamin) .... Take One By Mouth Daily 2)  Simcor 1000-20 Mg Xr24h-Tab (Niacin-Simvastatin) .... One Tab By Mouth At Night 3)  Teveten 400 Mg Tabs (Eprosartan Mesylate) .... 2  Two Times A Day 4)  Nitrostat 0.4 Mg  Subl (Nitroglycerin) .... Place 1 Tablet Under Tongue For Chest Pain, Repeat Every 5 Minutes For A Total of 3 Tablets 5)  Metamucil Plus Calcium  Caps (Psyllium-Calcium) .... Daily By Mouth 6)  Aspirin 81 Mg Tbec (Aspirin) .... Take One Tablet By Mouth Daily 7)  Furosemide 40 Mg Tabs (Furosemide) .... 2 Tabs Every Day 8)  Vitamin D3 2000 Unit Caps (Cholecalciferol) .... Take One By Mouth Daily  Allergies (verified): 1)  ! Penicillin V  Potassium (Penicillin V Potassium) 2)  ! Vioxx 3)  ! Tramadol Hcl 4)  ! Septra  Past History:  Past medical, surgical, family and social histories (including risk factors) reviewed, and no changes noted (except as noted below).  Past Medical History: Reviewed history from 07/20/2010 and no changes required. BRADYCARDIA, HYPERTENSIVE (ICD-427.89) CAROTID BRUIT, RIGHT (ICD-785.9) HYPERCHOLESTEROLEMIA/ TRIG. (234/293) (ICD-272.0) HYPERTENSION, BENIGN (ICD-401.1) OBESITY, MORBID (ICD-278.01) VASCULITIS (ICD-447.6) IMM THROMBOCYT PURP (DR GRANFORTUNA) (ICD-287.31) DERMATITIS, LEGS (ICD-692.9) HEMOCCULT POSITIVE STOOL (ICD-578.1) BLEEDING GUMS (ICD-523.8) LOW BACK PAIN, CHRONIC (ICD-724.2) SPECIAL SCREENING MALIGNANT NEOPLASM OF PROSTATE (ICD-V76.44) MYALGIA/MYOSITIS NOS (ICD-729.1) DEFICIENCY, VITAMIN D NOS (ICD-268.9) BLADDER DIVERTICULUM (ICD-596.3) OSTEOARTHRITIS, KNEES, BILATERAL (ICD-715.96) SPECIAL SCREENING MALIGNANT NEOPLASM OF PROSTATE (ICD-V76.44) ANXIETY (ICD-300.00) NEPHROLITHIASIS (ICD-592.0) MELANOMA, MALIGNANT, EAR R (ICD-172.2) GLUCOSE INTOLERANCE, HX OF (ICD-V12.2) THROMBOPHLEBITIS, RIGHT FOREARM (ICD-451.9) BENIGN PROSTATIC HYPERTROPHY, HX OF (ICD-V13.8) COLONIC POLYPS, HX OF (PATTERSON) (ICD-V12.72) GERD VIA UGI (ICD-530.81) EPIDIDYMITIS, LEFT S/P EPIDIMECTOMY (ICD-604.90) PNEUMONIA, BACTERIAL NOS (ICD-482.9) RML resolved.     -spirometry Normal 2009   Past Surgical History: Reviewed history from 09/04/2009 and no changes required. Excision of mucoid tumor Stress cardiolite , wnl EF 76% 09/15/97 Colonoscopy, polyps  [name]) 1995 ETT Elsie Lincoln) 04/96 Kidney Stone Retrieval [phone] Fem Cutaneous Nerve Entrapment due to Surgery (mild lat anesth of bilat legs)  Herniorraphy x2 R Appendectomy Limited pelvis/hip bone scan, negative 04/99 Bone scan negative 04/00 Colonoscopy , no polyps 04/00 Cataracts left eye 12/03, right eye  02/04 Wide local excision  right ear, melanoma with flap 06/04 Colonoscopy polyp benign 04/23/2004      5 yrs Stress cardiolite, mild inf. ischemia  EF 71% 05/11/04 Cath. mild plaque-statin 05/23/04 PFT wnl 08/09/04 Holter wnl 11/01/04 L Shoulder Surg 06/17/2005 ETT Myoview wnl 11/26/06 Bronchoscopy RML collapse with chronic pneumonia  bx neg. (Dr Delford Field) 9/08 Adenosine Cardiolite Nml  11/23/08 R Middle Finger DIP Joint Mucoid Cyst Excision (Dr Merlyn Lot) 03/07/09 HOSP ITP Severe Thrombocytopenia (Dr Cyndie Chime) 5/24-5/27/10 Abd U/S  NML No  Splenomegaly 05/15/09 Eyelid & Eyebrow lift "Onion Peele" left testical Colonoscopy Polyps Multip O/W nml. (Dr [name]) 08/02/09   no repeat req'd R Middle Finger DIP Joint Mucoid Ctyst Reexcision (Dr Merlyn Lot) 08/15/09  Family History: Reviewed history from 09/04/2009 and no changes required. Father: Died at age 15, MI, DM Mother: Died at 46 cancer ? thyroid Siblings: 5 brothers,6 sisters                1 Brother 43 dec Cerebral Hemm    Family History of Colon Cancer: Brother (questionable)  Social History: Reviewed history from 06/20/2009 and no changes required. Marital Status: Married Children: 2,  out of home Occupation: Retired,  1996 [employer] quit smoking in 1968 no caffeine states uses smokeless tobacco Alcohol Use - yes-rare Patient does not get regular exercise.  Packs/Day:  2.0  Review of Systems       The patient complains of shortness of breath with activity.  The patient denies shortness of breath at rest, productive cough, non-productive cough, coughing up blood, chest pain, irregular heartbeats, acid heartburn, indigestion, loss of appetite, weight change, abdominal pain, difficulty swallowing, sore throat, tooth/dental problems, headaches, nasal congestion/difficulty breathing through nose, sneezing, itching, ear ache, anxiety, depression, hand/feet swelling, joint stiffness or pain, rash, change in color of mucus, and fever.    Vital  Signs:  Patient profile:   77 year old male Height:      71 inches Weight:      281.6 pounds BMI:     39.42 O2 Sat:      96 % on Room air Temp:     97.9 degrees F oral Pulse rate:   69 / minute BP sitting:   124 / 68  (left arm) Cuff size:   large  Vitals Entered By: Renold Genta RCP, LPN (October 15, 2010 2:52 PM)  O2 Sat at Rest %:  96% O2 Flow:  Room air CC: Follow up, pt has not been seen since 2009, c/o increase sob with exertion over the last year Comments Medications reviewed with patient Renold Genta RCP, LPN  October 15, 2010 2:57 PM    Physical Exam  Additional Exam:  GEN: A/Ox3; pleasant , NAD  obese HEENT:  [place]/AT, , EACs-clear, TMs-wnl, NOSE-clear, THROAT-clear NECK:  Supple w/ fair ROM; no JVD; normal carotid impulses w/o bruits; no thyromegaly or nodules palpated; no lymphadenopathy. CHEST:  clear   HEART:  RRR, no m/r/g   ABDOMEN:  Soft & nt; nml bowel sounds; no organomegaly or masses detected. EXT: Warm bil,  no calf pain, edema, clubbing, pulses intact    Impression & Recommendations:  Problem # 1:  DYSPNEA ON EXERTION (ICD-786.09) Assessment Improved Overall improved dyspnea and cough. Prior hx of RML pneumonia resolved.  spirometry is normal today. plan no change in meds pulmonary f/u is as needed  His updated medication list for this problem includes:    Furosemide 40 Mg Tabs (Furosemide) .Marland KitchenMarland KitchenMarland KitchenMarland Kitchen  2 tabs every day  Orders: Spirometry w/Graph (94010) Est. Patient Level IV (29562)  Complete Medication List: 1)  Multivitamins Tabs (Multiple vitamin) .... Take one by mouth daily 2)  Simcor 1000-20 Mg Xr24h-tab (Niacin-simvastatin) .... One tab by mouth at night 3)  Teveten 400 Mg Tabs (Eprosartan mesylate) .... 2  two times a day 4)  Nitrostat 0.4 Mg Subl (Nitroglycerin) .... Place 1 tablet under tongue for chest pain, repeat every 5 minutes for a total of 3 tablets 5)  Metamucil Plus Calcium Caps (Psyllium-calcium) .... Daily by mouth 6)   Aspirin 81 Mg Tbec (Aspirin) .... Take one tablet by mouth daily 7)  Furosemide 40 Mg Tabs (Furosemide) .... 2 tabs every day 8)  Vitamin D3 2000 Unit Caps (Cholecalciferol) .... Take one by mouth daily  Patient Instructions: 1)  No change in medications 2)  Return  as needed

## 2011-01-22 NOTE — Progress Notes (Signed)
Summary: Patient's at Home Vitals - 09/08/2009 To 11/03/2009  Patient's at Home Vitals - 09/08/2009 To 11/03/2009   Imported By: Marylou Mccoy 01/04/2010 12:51:42  _____________________________________________________________________  External Attachment:    Type:   Image     Comment:   External Document

## 2011-01-22 NOTE — Assessment & Plan Note (Signed)
Summary: 30 MIN. APPT FOR LESION EXCISION RIGHT ANTECUBITAL FOSSA. / LFW   Vital Signs:  Patient profile:   77 year old male Weight:      280 pounds Temp:     97.0 degrees F oral Pulse rate:   80 / minute Pulse rhythm:   regular BP sitting:   118 / 70  (left arm) Cuff size:   large  Vitals Entered By: Sydell Axon LPN (October 17, 2010 2:34 PM) CC: Lesion removal   History of Present Illness: Pt here for excision of small swollen lesion in the right antecubital fossa at the site of a previous tick bite.  He has had no real sxs but we have both been concerned about retained tick parts.  Problems Prior to Update: 1)  Dyspnea On Exertion  (ICD-786.09) 2)  Tick Bite  (ICD-E906.4) 3)  Other Disease of Pharynx or Nasopharynx  (ICD-478.29) 4)  Eczema of The Breasts  (ICD-692.9) 5)  Encounter For Long-term Use of Other Medications  (ICD-V58.69) 6)  Edema  (ICD-782.3) 7)  Cad, Native Vessel  (ICD-414.01) 8)  Bradycardia, Hypertensive  (ICD-427.89) 9)  Carotid Bruit, Right  (ICD-785.9) 10)  Hypercholesterolemia/ Trig. (234/293)  (ICD-272.0) 11)  Hypertension, Benign  (ICD-401.1) 12)  Obesity, Morbid  (ICD-278.01) 13)  Vasculitis  (ICD-447.6) 14)  Imm Thrombocyt Purp (DR GRANFORTUNA)  (ICD-287.31) 15)  Dermatitis, Legs  (ICD-692.9) 16)  Hemoccult Positive Stool  (ICD-578.1) 17)  Bleeding Gums  (ICD-523.8) 18)  Low Back Pain, Chronic  (ICD-724.2) 19)  Special Screening Malignant Neoplasm of Prostate  (ICD-V76.44) 20)  Myalgia/myositis Nos  (ICD-729.1) 21)  Deficiency, Vitamin D Nos  (ICD-268.9) 22)  Bladder Diverticulum  (ICD-596.3) 23)  Osteoarthritis, Knees, Bilateral  (ICD-715.96) 24)  Screening For Malignannt Neoplasm, Site Nec  (ICD-V76.49) 25)  Anxiety  (ICD-300.00) 26)  Nephrolithiasis  (ICD-592.0) 27)  Melanoma, Malignant, Ear R  (ICD-172.2) 28)  Glucose Intolerance, Hx of  (ICD-V12.2) 29)  Thrombophlebitis, Right Forearm  (ICD-451.9) 30)  Benign Prostatic Hypertrophy,  Hx of  (ICD-V13.8) 31)  Colonic Polyps, Hx of (PATTERSON)  (ICD-V12.72) 32)  Gerd Via Ugi  (ICD-530.81) 33)  Epididymitis, Left S/p Epidimectomy  (ICD-604.90)  Medications Prior to Update: 1)  Multivitamins  Tabs (Multiple Vitamin) .... Take One By Mouth Daily 2)  Simcor 1000-20 Mg Xr24h-Tab (Niacin-Simvastatin) .... One Tab By Mouth At Night 3)  Teveten 400 Mg Tabs (Eprosartan Mesylate) .... 2  Two Times A Day 4)  Nitrostat 0.4 Mg  Subl (Nitroglycerin) .... Place 1 Tablet Under Tongue For Chest Pain, Repeat Every 5 Minutes For A Total of 3 Tablets 5)  Metamucil Plus Calcium  Caps (Psyllium-Calcium) .... Daily By Mouth 6)  Aspirin 81 Mg Tbec (Aspirin) .... Take One Tablet By Mouth Daily 7)  Furosemide 40 Mg Tabs (Furosemide) .... 2 Tabs Every Day 8)  Vitamin D3 2000 Unit Caps (Cholecalciferol) .... Take One By Mouth Daily  Allergies: 1)  ! Penicillin V Potassium (Penicillin V Potassium) 2)  ! Vioxx 3)  ! Tramadol Hcl 4)  ! Septra  Physical Exam  General:  well developed, well nourished, in no acute distress, again obese. Skin:  Right Antecubital fossa, area prepped and draped in usual sterile fashion. 2% lido w/ epi used for local. Elliptical incision made around the lesion and removed. No palpable or visual part seen with incision over lesion excised. Sent to Path. 3 4-0 nylon sutures placed. Neosporin and sterile dressing placed. Reomve sutures in one week.   Impression &  Recommendations:  Problem # 1:  TICK BITE (ICD-E906.4) Assessment Unchanged  Resultant lesion excised. RTC for suture removal.  Orders: Excise lesion (TAL) 0 - 0.5 cm (11400)  Complete Medication List: 1)  Multivitamins Tabs (Multiple vitamin) .... Take one by mouth daily 2)  Simcor 1000-20 Mg Xr24h-tab (Niacin-simvastatin) .... One tab by mouth at night 3)  Teveten 400 Mg Tabs (Eprosartan mesylate) .... 2  two times a day 4)  Nitrostat 0.4 Mg Subl (Nitroglycerin) .... Place 1 tablet under tongue for chest  pain, repeat every 5 minutes for a total of 3 tablets 5)  Metamucil Plus Calcium Caps (Psyllium-calcium) .... Daily by mouth 6)  Aspirin 81 Mg Tbec (Aspirin) .... Take one tablet by mouth daily 7)  Furosemide 40 Mg Tabs (Furosemide) .... 2 tabs every day 8)  Vitamin D3 2000 Unit Caps (Cholecalciferol) .... Take one by mouth daily  Patient Instructions: 1)  RTC one week, suture removal   Orders Added: 1)  Excise lesion (TAL) 0 - 0.5 cm [11400]    Current Allergies (reviewed today): ! PENICILLIN V POTASSIUM (PENICILLIN V POTASSIUM) ! VIOXX ! TRAMADOL HCL ! SEPTRA

## 2011-01-22 NOTE — Progress Notes (Signed)
----   Converted from flag ---- ---- 09/18/2010 2:01 PM, Crawford Givens MD wrote: cmet/lipid  401.1 cbc  287.31 vit d 268.9  ---- 09/18/2010 7:53 AM, Liane Comber CMA (AAMA) wrote: Lab orders please! Good Morning! This pt is scheduled for cpx labs tomorrow, which labs to draw and dx codes to use? Thanks Tasha ------------------------------

## 2011-01-22 NOTE — Assessment & Plan Note (Signed)
Summary: lump on chest   Vital Signs:  Patient profile:   77 year old male Weight:      282.75 pounds Temp:     97.5 degrees F oral Pulse rate:   68 / minute Pulse rhythm:   regular BP sitting:   124 / 64  (left arm) Cuff size:   large  Vitals Entered By: Sydell Axon LPN (January 22, 2010 10:40 AM) CC: Check both breast because they sting, burn and itch all the time   History of Present Illness: Pt here for burning and stimnging of the breast area bilat. He has just seen his Dermatologist for yearly check due to Melanoma and said after examining a whole body exam and not looking specifically at the breast tissue area that he should have a mammogram and poss an MRI for his complaint! He has felt no masses and has a subjective global feeling of burning and stinging that is reasonably superficial altho he feels like it is deeper than just skin level.Marland Kitchen He is able to sleep but feels it most of the time and if he wakes up, the sensation is there. He has gynecomastia from obesity. He has no other overt skin problems other than various skin cancers, the most recent being a melanoma behin the left ear.  Problems Prior to Update: 1)  Encounter For Long-term Use of Other Medications  (ICD-V58.69) 2)  Edema  (ICD-782.3) 3)  Cad, Native Vessel  (ICD-414.01) 4)  Bradycardia, Hypertensive  (ICD-427.89) 5)  Carotid Bruit, Right  (ICD-785.9) 6)  Hypercholesterolemia/ Trig. (234/293)  (ICD-272.0) 7)  Hypertension, Benign  (ICD-401.1) 8)  Obesity, Morbid  (ICD-278.01) 9)  Vasculitis  (ICD-447.6) 10)  Imm Thrombocyt Purp (DR GRANFORTUNA)  (ICD-287.31) 11)  Dermatitis, Legs  (ICD-692.9) 12)  Hemoccult Positive Stool  (ICD-578.1) 13)  Bleeding Gums  (ICD-523.8) 14)  Low Back Pain, Chronic  (ICD-724.2) 15)  Special Screening Malignant Neoplasm of Prostate  (ICD-V76.44) 16)  Myalgia/myositis Nos  (ICD-729.1) 17)  Deficiency, Vitamin D Nos  (ICD-268.9) 18)  Bladder Diverticulum  (ICD-596.3) 19)   Osteoarthritis, Knees, Bilateral  (ICD-715.96) 20)  Screening For Malignannt Neoplasm, Site Nec  (ICD-V76.49) 21)  Anxiety  (ICD-300.00) 22)  Nephrolithiasis  (ICD-592.0) 23)  Melanoma, Malignant, Ear R  (ICD-172.2) 24)  Glucose Intolerance, Hx of  (ICD-V12.2) 25)  Thrombophlebitis, Right Forearm  (ICD-451.9) 26)  Benign Prostatic Hypertrophy, Hx of  (ICD-V13.8) 27)  Colonic Polyps, Hx of (PATTERSON)  (ICD-V12.72) 28)  Gerd Via Ugi  (ICD-530.81) 29)  Epididymitis, Left S/p Epidimectomy  (ICD-604.90)  Medications Prior to Update: 1)  Multivitamins  Tabs (Multiple Vitamin) .... Take One By Mouth Daily 2)  Simcor 1000-20 Mg Xr24h-Tab (Niacin-Simvastatin) .... One Tab By Mouth At Night 3)  Teveten 400 Mg Tabs (Eprosartan Mesylate) .Marland Kitchen.. 1 Two Times A Day 4)  Nitrostat 0.4 Mg  Subl (Nitroglycerin) .... Place 1 Tablet Under Tongue For Chest Pain, Repeat Every 5 Minutes For A Total of 3 Tablets 5)  Metamucil Plus Calcium  Caps (Psyllium-Calcium) .... Daily By Mouth 6)  Aspirin 81 Mg Tbec (Aspirin) .... Take One Tablet By Mouth Daily 7)  Furosemide 40 Mg Tabs (Furosemide) .... 2 Tabs Every Day 8)  Cortisporin 1 % Oint (Bacit-Poly-Neo Hc) .... Apply To Eye Three Times A Day  Allergies: 1)  ! Penicillin V Potassium (Penicillin V Potassium) 2)  ! Vioxx 3)  ! Tramadol Hcl 4)  ! Septra  Physical Exam  General:  well developed, well nourished, in  no acute distress, again obese. Head:  normocephalic and atraumatic, sinuses NT. Eyes:  PERRLA/EOM intact; conjunctiva irritated, palpebral erythem with mild amt d/c. Ears:  TMs intact and clear with normal canals Nose:  no deformity, discharge, inflammation, or lesions Mouth:  no deformity or lesions, mild thick PND. Neck:  no masses, thyromegaly, or abnormal cervical nodes Breasts:  No masses but physiologic gynecomastia from obesity. No axillary lymph nodes found. Lungs:  clear bilaterally to auscultation and percussion Heart:  regular rate and  rhythm, S1, S2 without murmurs, rubs, gallops, or clicks Skin:  Mild erythema and bumpiness of the skin surrounding the areolae bilat. Axillary Nodes:  No palpable lymphadenopathy   Impression & Recommendations:  Problem # 1:  ECZEMA OF THE BREASTS (ICD-692.9)  Try Betamethasone intermeduiate strength cream twice a day and RTC one month. May need Prolactin lvl, do not think Mammo or MRI needed at this point.  His updated medication list for this problem includes:    Betamethasone Valerate 0.1 % Crea (Betamethasone valerate) .Marland Kitchen... Apply to area two times a day  Problem # 2:  BRADYCARDIA, HYPERTENSIVE (ICD-427.89) Assessment: Unchanged  Good control. Cont curr meds. BP 124/64 today. Labs Reviewed: Na: 140 (11/27/2009)   K+: 3.9 (11/27/2009)   CL: 101 (11/27/2009)   HCO3: 30 (11/27/2009) Ca: 9.1 (11/27/2009)   TSH: 3.96 (08/18/2008)   HCO3: 30 (11/27/2009)  Complete Medication List: 1)  Multivitamins Tabs (Multiple vitamin) .... Take one by mouth daily 2)  Simcor 1000-20 Mg Xr24h-tab (Niacin-simvastatin) .... One tab by mouth at night 3)  Teveten 400 Mg Tabs (Eprosartan mesylate) .Marland Kitchen.. 1 two times a day 4)  Nitrostat 0.4 Mg Subl (Nitroglycerin) .... Place 1 tablet under tongue for chest pain, repeat every 5 minutes for a total of 3 tablets 5)  Metamucil Plus Calcium Caps (Psyllium-calcium) .... Daily by mouth 6)  Aspirin 81 Mg Tbec (Aspirin) .... Take one tablet by mouth daily 7)  Furosemide 40 Mg Tabs (Furosemide) .... 2 tabs every day 8)  Betamethasone Valerate 0.1 % Crea (Betamethasone valerate) .... Apply to area two times a day  Patient Instructions: 1)  RTC one month for recheck Prescriptions: BETAMETHASONE VALERATE 0.1 % CREA (BETAMETHASONE VALERATE) apply to area two times a day  #60gm x 1   Entered and Authorized by:   Shaune Leeks MD   Signed by:   Shaune Leeks MD on 01/22/2010   Method used:   Electronically to        [facility] [id number]  (retail)       [address] [place], [place]  [address]       Ph: [phone]       Fax: [phone]   RxID:   [id number]   Current Allergies (reviewed today): ! PENICILLIN V POTASSIUM (PENICILLIN V POTASSIUM) ! VIOXX ! TRAMADOL HCL ! SEPTRA

## 2011-01-22 NOTE — Miscellaneous (Signed)
Summary: Consent for Lesion Excision-right arm  Consent for Lesion Excision-right arm   Imported By: Beau Fanny 10/18/2010 13:53:28  _____________________________________________________________________  External Attachment:    Type:   Image     Comment:   External Document

## 2011-01-22 NOTE — Assessment & Plan Note (Signed)
Summary: TICK BITE/CLE   Vital Signs:  Patient profile:   77 year old male Weight:      277.50 pounds Temp:     97.5 degrees F oral Pulse rate:   64 / minute Pulse rhythm:   regular BP sitting:   124 / 64  (left arm) Cuff size:   large  Vitals Entered By: Sydell Axon LPN (May 28, 1609 3:24 PM) CC: Tick bite, found tick on right arm Saturday   History of Present Illness: Pt here for tick that was found on him on the right arm in the antecubital fossa area that became echymotic a day or two later and is worried about his platelet count possibly being low. He relates however not being aware of a tick bite but thought he had the eruption of a skin tag and had been rubbing his arm significantly which I think irritated the local area and that together with the fact that he had a tick on him, I think there was some local vessel damage leaking some blood into the superficial  tissue which caused the echymosis, being more dependent as he has had his arm down most of the time. He is due to see Dr Laurie Panda at [facility] the end of this week and will be getting a CBC there at that time, so plts willl be checked. He feels well, denies fever, flu-like sxs or other rash.  Problems Prior to Update: 1)  Other Disease of Pharynx or Nasopharynx  (ICD-478.29) 2)  Eczema of The Breasts  (ICD-692.9) 3)  Encounter For Long-term Use of Other Medications  (ICD-V58.69) 4)  Edema  (ICD-782.3) 5)  Cad, Native Vessel  (ICD-414.01) 6)  Bradycardia, Hypertensive  (ICD-427.89) 7)  Carotid Bruit, Right  (ICD-785.9) 8)  Hypercholesterolemia/ Trig. (234/293)  (ICD-272.0) 9)  Hypertension, Benign  (ICD-401.1) 10)  Obesity, Morbid  (ICD-278.01) 11)  Vasculitis  (ICD-447.6) 12)  Imm Thrombocyt Purp (DR GRANFORTUNA)  (ICD-287.31) 13)  Dermatitis, Legs  (ICD-692.9) 14)  Hemoccult Positive Stool  (ICD-578.1) 15)  Bleeding Gums  (ICD-523.8) 16)  Low Back Pain, Chronic  (ICD-724.2) 17)  Special Screening Malignant  Neoplasm of Prostate  (ICD-V76.44) 18)  Myalgia/myositis Nos  (ICD-729.1) 19)  Deficiency, Vitamin D Nos  (ICD-268.9) 20)  Bladder Diverticulum  (ICD-596.3) 21)  Osteoarthritis, Knees, Bilateral  (ICD-715.96) 22)  Screening For Malignannt Neoplasm, Site Nec  (ICD-V76.49) 23)  Anxiety  (ICD-300.00) 24)  Nephrolithiasis  (ICD-592.0) 25)  Melanoma, Malignant, Ear R  (ICD-172.2) 26)  Glucose Intolerance, Hx of  (ICD-V12.2) 27)  Thrombophlebitis, Right Forearm  (ICD-451.9) 28)  Benign Prostatic Hypertrophy, Hx of  (ICD-V13.8) 29)  Colonic Polyps, Hx of (PATTERSON)  (ICD-V12.72) 30)  Gerd Via Ugi  (ICD-530.81) 31)  Epididymitis, Left S/p Epidimectomy  (ICD-604.90)  Medications Prior to Update: 1)  Multivitamins  Tabs (Multiple Vitamin) .... Take One By Mouth Daily 2)  Simcor 1000-20 Mg Xr24h-Tab (Niacin-Simvastatin) .... One Tab By Mouth At Night 3)  Teveten 400 Mg Tabs (Eprosartan Mesylate) .Marland Kitchen.. 1 Two Times A Day 4)  Nitrostat 0.4 Mg  Subl (Nitroglycerin) .... Place 1 Tablet Under Tongue For Chest Pain, Repeat Every 5 Minutes For A Total of 3 Tablets 5)  Metamucil Plus Calcium  Caps (Psyllium-Calcium) .... Daily By Mouth 6)  Aspirin 81 Mg Tbec (Aspirin) .... Take One Tablet By Mouth Daily 7)  Furosemide 40 Mg Tabs (Furosemide) .... 2 Tabs Every Day 8)  Betamethasone Valerate 0.1 % Crea (Betamethasone Valerate) .... Apply To  Area Two Times A Day  Allergies: 1)  ! Penicillin V Potassium (Penicillin V Potassium) 2)  ! Vioxx 3)  ! Tramadol Hcl 4)  ! Septra  Physical Exam  General:  well developed, well nourished, in no acute distress, again obese. Head:  normocephalic and atraumatic, sinuses NT. Eyes:  Conjunctiva clear bilaterally.  Ears:  TMs intact and clear with normal canals Nose:  no deformity, discharge, inflammation, or lesions Mouth:  no deformity or lesions, mild thick PND. Blue papular flat lesion of the posterior pharynx Neck:  no masses, thyromegaly, or abnormal cervical  nodes Chest Wall:  no deformities noted Lungs:  clear bilaterally to auscultation and percussion Heart:  regular rate and rhythm, S1, S2 without murmurs, rubs, gallops, or clicks Skin:  Slight echymosis eccentrically located around a healing bite site, more dependent , in the right antecubital fossa area of the right arm, minimal swelling and full ROM. No real local tenderness.   Impression & Recommendations:  Problem # 1:  TICK BITE (ICD-E906.4) Assessment New No local sxs as yet. Tick was small deer tiick, slightly engorged but easily removed by rubbing alcohol on the area in what he thought was a cleansing attempt but the tick actually came off. No scalloped peripheral rash seen. DIsscused signs and sxs of Lyme Dz. RTC if unsure or sxs start.  Complete Medication List: 1)  Multivitamins Tabs (Multiple vitamin) .... Take one by mouth daily 2)  Simcor 1000-20 Mg Xr24h-tab (Niacin-simvastatin) .... One tab by mouth at night 3)  Teveten 400 Mg Tabs (Eprosartan mesylate) .Marland Kitchen.. 1 two times a day 4)  Nitrostat 0.4 Mg Subl (Nitroglycerin) .... Place 1 tablet under tongue for chest pain, repeat every 5 minutes for a total of 3 tablets 5)  Metamucil Plus Calcium Caps (Psyllium-calcium) .... Daily by mouth 6)  Aspirin 81 Mg Tbec (Aspirin) .... Take one tablet by mouth daily 7)  Furosemide 40 Mg Tabs (Furosemide) .... 2 tabs every day 8)  Betamethasone Valerate 0.1 % Crea (Betamethasone valerate) .... Apply to area two times a day  Current Allergies (reviewed today): ! PENICILLIN V POTASSIUM (PENICILLIN V POTASSIUM) ! VIOXX ! TRAMADOL HCL ! SEPTRA

## 2011-01-23 ENCOUNTER — Ambulatory Visit: Payer: Self-pay | Admitting: Family Medicine

## 2011-01-24 NOTE — Assessment & Plan Note (Signed)
Summary: f74m/dfg      Allergies Added:   Visit Type:  Follow-up Primary Provider:  Laurita Quint, MD  CC:  no complaints.  History of Present Illness: James [name] returns today for evaluation and management of his coronary artery disease, carotid disease, hypertension and lower extremity edema.  He has no complaints ischemia or angina. He needs his nitroglycerin renewed.  His blood pressure excellent control. Heart rate and good. He denies presyncope syncope or palpitations. He's had no significant edema.  Current Medications (verified): 1)  Multivitamins  Tabs (Multiple Vitamin) .... Take One By Mouth Daily 2)  Simcor 1000-20 Mg Xr24h-Tab (Niacin-Simvastatin) .... One Tab By Mouth At Night 3)  Teveten 400 Mg Tabs (Eprosartan Mesylate) .... 2  Two Times A Day 4)  Nitrostat 0.4 Mg  Subl (Nitroglycerin) .... Place 1 Tablet Under Tongue For Chest Pain, Repeat Every 5 Minutes For A Total of 3 Tablets 5)  Metamucil Plus Calcium  Caps (Psyllium-Calcium) .... Daily By Mouth 6)  Aspirin 81 Mg Tbec (Aspirin) .... Take One Tablet By Mouth Daily 7)  Furosemide 40 Mg Tabs (Furosemide) .... 2 Tabs Every Day 8)  Vitamin D3 2000 Unit Caps (Cholecalciferol) .... Take One By Mouth Daily  Allergies (verified): 1)  ! Penicillin V Potassium (Penicillin V Potassium) 2)  ! Vioxx 3)  ! Tramadol Hcl 4)  ! Septra  Past History:  Past Medical History: Last updated: 07/20/2010 BRADYCARDIA, HYPERTENSIVE (ICD-427.89) CAROTID BRUIT, RIGHT (ICD-785.9) HYPERCHOLESTEROLEMIA/ TRIG. (234/293) (ICD-272.0) HYPERTENSION, BENIGN (ICD-401.1) OBESITY, MORBID (ICD-278.01) VASCULITIS (ICD-447.6) IMM THROMBOCYT PURP (DR GRANFORTUNA) (ICD-287.31) DERMATITIS, LEGS (ICD-692.9) HEMOCCULT POSITIVE STOOL (ICD-578.1) BLEEDING GUMS (ICD-523.8) LOW BACK PAIN, CHRONIC (ICD-724.2) SPECIAL SCREENING MALIGNANT NEOPLASM OF PROSTATE (ICD-V76.44) MYALGIA/MYOSITIS NOS (ICD-729.1) DEFICIENCY, VITAMIN D NOS (ICD-268.9) BLADDER  DIVERTICULUM (ICD-596.3) OSTEOARTHRITIS, KNEES, BILATERAL (ICD-715.96) SPECIAL SCREENING MALIGNANT NEOPLASM OF PROSTATE (ICD-V76.44) ANXIETY (ICD-300.00) NEPHROLITHIASIS (ICD-592.0) MELANOMA, MALIGNANT, EAR R (ICD-172.2) GLUCOSE INTOLERANCE, HX OF (ICD-V12.2) THROMBOPHLEBITIS, RIGHT FOREARM (ICD-451.9) BENIGN PROSTATIC HYPERTROPHY, HX OF (ICD-V13.8) COLONIC POLYPS, HX OF (PATTERSON) (ICD-V12.72) GERD VIA UGI (ICD-530.81) EPIDIDYMITIS, LEFT S/P EPIDIMECTOMY (ICD-604.90) PNEUMONIA, BACTERIAL NOS (ICD-482.9) RML resolved.     -spirometry Normal 2009   Past Surgical History: Last updated: 10-02-2009 Excision of mucoid tumor Stress cardiolite , wnl EF 76% 09/15/97 Colonoscopy, polyps  [name]) 1995 ETT Elsie Lincoln) 04/96 Kidney Stone Retrieval 1950s Fem Cutaneous Nerve Entrapment due to Surgery (mild lat anesth of bilat legs)  Herniorraphy x2 R Appendectomy Limited pelvis/hip bone scan, negative 04/99 Bone scan negative 04/00 Colonoscopy , no polyps 04/00 Cataracts left eye 12/03, right eye 02/04 Wide local excision right ear, melanoma with flap 06/04 Colonoscopy polyp benign 04/23/2004      5 yrs Stress cardiolite, mild inf. ischemia  EF 71% 05/11/04 Cath. mild plaque-statin 05/23/04 PFT wnl 08/09/04 Holter wnl 11/01/04 L Shoulder Surg 06/17/2005 ETT Myoview wnl 11/26/06 Bronchoscopy RML collapse with chronic pneumonia  bx neg. (Dr Delford Field) 9/08 Adenosine Cardiolite Nml  11/23/08 R Middle Finger DIP Joint Mucoid Cyst Excision (Dr Merlyn Lot) 03/07/09 HOSP ITP Severe Thrombocytopenia (Dr Cyndie Chime) 5/24-5/27/10 Abd U/S  NML No  Splenomegaly 05/15/09 Eyelid & Eyebrow lift "Onion Peele" left testical Colonoscopy Polyps Multip O/W nml. (Dr [name]) 08/02/09   no repeat req'd R Middle Finger DIP Joint Mucoid Ctyst Reexcision (Dr Merlyn Lot) 08/15/09  Family History: Last updated: 10-02-2009 Father: Died at age 76, MI, DM Mother: Died at 34 cancer ? 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 quit smoking in 1968 no caffeine states uses smokeless tobacco Alcohol Use - yes-rare Patient does not get regular exercise.   Risk Factors: Alcohol Use: <1 (09/26/2010) Caffeine Use: 0 (09/26/2010) Diet: weight continues to challenge (09/26/2010) Exercise: yes (09/26/2010)  Risk Factors: Smoking Status: quit (10/15/2010) Packs/Day: 2.0 (10/15/2010) Passive Smoke Exposure: no (09/26/2010)  Clinical Reports Reviewed:  Cardiac Cath:  05/23/2004: Cardiac Cath Findings:    IMPRESSION:  The films were reviewed with Dr. Riley Kill.  We both agreed that  the circumflex lesion is not flow-limiting.  The patient has not had any  significant angina.  His exertional dyspnea is most likely due to chronic  obstructive pulmonary disease and his excessive weight.  The patient stopped  smoking in 1968.  His chest x-ray shows some scarring at the bases and  chronic obstructive pulmonary disease .  He will follow up with Dr. Hetty Ely  and possibly have PFTs and further workup for this.  Weight loss will be key  to symptomatic relief of his dyspnea.   Given the fact that the patient does have some plaque in his coronary  arteries, he should be on a statin drug.  His daughter is a drug rep and can  get Advicor for the patient free.  I would stop his Tricor and start him on  Advicor.  He will need followup LFTs and cholesterol after starting this  medication.   This was all explained to the family.  As long as his leg heals well he will  be discharged later today.                                             Charlton Haws, M.D. Cardiac Cath Findings:   RA ventriculography showed a small area of inferior lateral James [name]  hypokinesis.  The EF was still in  the normal range of 55%.  Aortic pressure  was 140/88 and LV pressure was 139/15.  There was no significant James, and no  gradient across the aortic valve.   IMPRESSION:  Films will be reviewed with Dr. Riley Kill.  Unfortunately, this  obtuse marginal branch is small.  It is not suitable for stenting, and I  suspect that repeat balloon angioplasty is the only option.  Hopefully, Dr.  Riley Kill can do this later today.                                             Charlton Haws, M.D.   Review of Systems       negative history of present illness  Vital Signs:  Patient profile:   77 year old male Height:      71 inches Weight:      279 pounds BMI:     39.05 Pulse rate:   70 / minute BP sitting:   122 / 60  (left arm) Cuff size:   large  Vitals Entered By: Hardin Negus, RMA (January 02, 2011 9:54 AM)  Physical Exam  General:  obese.   Head:  normocephalic and  atraumatic Eyes:  PERRLA/EOM intact; conjunctiva and lids normal. Neck:  Neck supple, no JVD. No masses, thyromegaly or abnormal cervical nodes. Chest James [name]:  no deformities or breast masses noted Lungs:  Clear bilaterally to auscultation and percussion. Heart:  PMI poorly appreciated, soft S1-S2, regular rate and rhythm, right carotid bruit Msk:  Back normal, normal gait. Muscle strength and tone normal. Pulses:  pulses normal in all 4 extremities Extremities:  No clubbing or cyanosis. Neurologic:  Alert and oriented x 3. Skin:  Intact without lesions or rashes. Psych:  Normal affect.   Impression & Recommendations:  Problem # 1:  DYSPNEA ON EXERTION (ICD-786.09) Assessment Improved  His updated medication list for this problem includes:    Teveten 400 Mg Tabs (Eprosartan mesylate) .Marland Kitchen... 2  two times a day    Aspirin 81 Mg Tbec (Aspirin) .Marland Kitchen... Take one tablet by mouth daily    Furosemide 40 Mg Tabs (Furosemide) .Marland Kitchen... 2 tabs every day  Problem # 2:  EDEMA (ICD-782.3) Assessment: Improved  Problem # 3:  CAD, NATIVE  VESSEL (ICD-414.01) Assessment: Unchanged  His updated medication list for this problem includes:    Nitrostat 0.4 Mg Subl (Nitroglycerin) .Marland Kitchen... Place 1 tablet under tongue for chest pain, repeat every 5 minutes for a total of 3 tablets    Aspirin 81 Mg Tbec (Aspirin) .Marland Kitchen... Take one tablet by mouth daily  Problem # 4:  CAROTID BRUIT, RIGHT (ICD-785.9) Assessment: Unchanged  Orders: Carotid Duplex (Carotid Duplex)  Problem # 5:  HYPERCHOLESTEROLEMIA/ TRIG. (234/293) (ICD-272.0)  His updated medication list for this problem includes:    Simcor 1000-20 Mg Xr24h-tab (Niacin-simvastatin) ..... One tab by mouth at night  Problem # 6:  OBESITY, MORBID (ICD-278.01) Assessment: Unchanged  Problem # 7:  HYPERTENSION, BENIGN (ICD-401.1) Assessment: Improved  His updated medication list for this problem includes:    Teveten 400 Mg Tabs (Eprosartan mesylate) .Marland Kitchen... 2  two times a day    Aspirin 81 Mg Tbec (Aspirin) .Marland Kitchen... Take one tablet by mouth daily    Furosemide 40 Mg Tabs (Furosemide) .Marland Kitchen... 2 tabs every day  Patient Instructions: 1)  Your physician recommends that you schedule a follow-up appointment in: 6 months with Dr. Daleen Squibb 2)  Your physician recommends that you continue on your current medications as directed. Please refer to the Current Medication list given to you today. 3)  Your physician has requested that you have a carotid duplex. This test is an ultrasound of the carotid arteries in your neck. It looks at blood flow through these arteries that supply the brain with blood. Allow one hour for this exam. There are no restrictions or special instructions. 4)  Your physician recommended you take 1 tablet (or 1 spray) under tongue at onset of chest pain; you may repeat every 5 minutes for up to 3 doses. If 3 or more doses are required, call 911 and proceed to the ER immediately. Prescriptions: NITROSTAT 0.4 MG  SUBL (NITROGLYCERIN) place 1 tablet under tongue for chest pain, repeat  every 5 minutes for a total of 3 tablets  #25 x 6   Entered by:   Lisabeth Devoid RN   Authorized by:   Gaylord Shih, MD, [facility]   Signed by:   Lisabeth Devoid RN on 01/02/2011   Method used:   Electronically to        [facility] [id number] (retail)       [address]  [place], [place]  [address]       Ph: [phone]       Fax: [phone]   RxID:   [id number] TEVETEN 400 MG TABS (EPROSARTAN MESYLATE) 2  two times a day  #90 x 3   Entered by:   Lisabeth Devoid RN   Authorized by:   Gaylord Shih, MD, [facility]   Signed by:   Lisabeth Devoid RN on 01/02/2011   Method used:   Printed then faxed to ...       [facility] [id number] (retail)       [address] [place], [place]  [address]       Ph: [phone]       Fax: [phone]   RxID:   [id number]

## 2011-01-24 NOTE — Medication Information (Signed)
Summary: Teveten  Teveten   Imported By: Marylou Mccoy 01/09/2011 18:23:15  _____________________________________________________________________  External Attachment:    Type:   Image     Comment:   External Document

## 2011-01-24 NOTE — Letter (Signed)
Summary: [facility]  [facility]   Imported By: Lennie Odor 12/20/2010 16:55:32  _____________________________________________________________________  External Attachment:    Type:   Image     Comment:   External Document

## 2011-01-25 NOTE — Letter (Signed)
Summary: Dr.Patricia Long,Surgical Oncology & Endocrine Surgery,Note  Dr.Patricia Long,Surgical Oncology & Endocrine Surgery,Note   Imported By: Beau Fanny 06/19/2010 15:18:57  _____________________________________________________________________  External Attachment:    Type:   Image     Comment:   External Document

## 2011-02-26 ENCOUNTER — Other Ambulatory Visit: Payer: Self-pay | Admitting: Oncology

## 2011-02-26 ENCOUNTER — Encounter: Payer: Self-pay | Admitting: Family Medicine

## 2011-02-26 ENCOUNTER — Encounter (HOSPITAL_BASED_OUTPATIENT_CLINIC_OR_DEPARTMENT_OTHER): Payer: Medicare Other | Admitting: Oncology

## 2011-02-26 DIAGNOSIS — D693 Immune thrombocytopenic purpura: Secondary | ICD-10-CM

## 2011-02-26 DIAGNOSIS — Z23 Encounter for immunization: Secondary | ICD-10-CM

## 2011-02-26 LAB — CBC WITH DIFFERENTIAL/PLATELET
Basophils Absolute: 0 10*3/uL (ref 0.0–0.1)
Eosinophils Absolute: 0.1 10*3/uL (ref 0.0–0.5)
HGB: 12.9 g/dL — ABNORMAL LOW (ref 13.0–17.1)
MCV: 90.2 fL (ref 79.3–98.0)
MONO#: 1 10*3/uL — ABNORMAL HIGH (ref 0.1–0.9)
NEUT#: 5.4 10*3/uL (ref 1.5–6.5)
RBC: 4.3 10*6/uL (ref 4.20–5.82)
RDW: 14.8 % — ABNORMAL HIGH (ref 11.0–14.6)
WBC: 8.7 10*3/uL (ref 4.0–10.3)
lymph#: 2.1 10*3/uL (ref 0.9–3.3)

## 2011-03-05 ENCOUNTER — Encounter: Payer: Self-pay | Admitting: Family Medicine

## 2011-03-12 NOTE — Letter (Signed)
Summary: 02/27/2011-[facility]  [facility]   Imported By: Kassie Mends 03/05/2011 08:16:05  _____________________________________________________________________  External Attachment:    Type:   Image     Comment:   External Document  Appended Document: 02/27/2011-[facility]     Clinical Lists Changes  Observations: Added new observation of PAST MED HX: BRADYCARDIA, HYPERTENSIVE (ICD-427.89) CAROTID BRUIT, RIGHT (ICD-785.9) HYPERCHOLESTEROLEMIA/ TRIG. (234/293) (ICD-272.0) HYPERTENSION, BENIGN (ICD-401.1) OBESITY, MORBID (ICD-278.01) VASCULITIS (ICD-447.6) IMM THROMBOCYT PURP (DR GRANFORTUNA) (ICD-287.31) DERMATITIS, LEGS (ICD-692.9) HEMOCCULT POSITIVE STOOL (ICD-578.1) BLEEDING GUMS (ICD-523.8) LOW BACK PAIN, CHRONIC (ICD-724.2) SPECIAL SCREENING MALIGNANT NEOPLASM OF PROSTATE (ICD-V76.44) MYALGIA/MYOSITIS NOS (ICD-729.1) DEFICIENCY, VITAMIN D NOS (ICD-268.9) BLADDER DIVERTICULUM (ICD-596.3) OSTEOARTHRITIS, KNEES, BILATERAL (ICD-715.96) ANXIETY (ICD-300.00) NEPHROLITHIASIS (ICD-592.0) MELANOMA, MALIGNANT, EAR R (ICD-172.2) GLUCOSE INTOLERANCE, HX OF (ICD-V12.2) THROMBOPHLEBITIS, RIGHT FOREARM (ICD-451.9) BENIGN PROSTATIC HYPERTROPHY, HX OF (ICD-V13.8) COLONIC POLYPS, HX OF (PATTERSON) (ICD-V12.72) GERD VIA UGI (ICD-530.81) EPIDIDYMITIS, LEFT S/P EPIDIMECTOMY (ICD-604.90) PNEUMONIA, BACTERIAL NOS (ICD-482.9) RML resolved.     -spirometry Normal 2009  H/o ITP per Dr. Cyndie Chime 2011, normal platelets 02/2011  [facility]-Dr. Patsi Sears (03/05/2011 8:41)       Past History:  Past Medical History: BRADYCARDIA, HYPERTENSIVE (ICD-427.89) CAROTID BRUIT, RIGHT (ICD-785.9) HYPERCHOLESTEROLEMIA/ TRIG. (234/293) (ICD-272.0) HYPERTENSION, BENIGN (ICD-401.1) OBESITY, MORBID (ICD-278.01) VASCULITIS (ICD-447.6) IMM THROMBOCYT PURP (DR GRANFORTUNA) (ICD-287.31) DERMATITIS, LEGS (ICD-692.9) HEMOCCULT POSITIVE STOOL  (ICD-578.1) BLEEDING GUMS (ICD-523.8) LOW BACK PAIN, CHRONIC (ICD-724.2) SPECIAL SCREENING MALIGNANT NEOPLASM OF PROSTATE (ICD-V76.44) MYALGIA/MYOSITIS NOS (ICD-729.1) DEFICIENCY, VITAMIN D NOS (ICD-268.9) BLADDER DIVERTICULUM (ICD-596.3) OSTEOARTHRITIS, KNEES, BILATERAL (ICD-715.96) ANXIETY (ICD-300.00) NEPHROLITHIASIS (ICD-592.0) MELANOMA, MALIGNANT, EAR R (ICD-172.2) GLUCOSE INTOLERANCE, HX OF (ICD-V12.2) THROMBOPHLEBITIS, RIGHT FOREARM (ICD-451.9) BENIGN PROSTATIC HYPERTROPHY, HX OF (ICD-V13.8) COLONIC POLYPS, HX OF (PATTERSON) (ICD-V12.72) GERD VIA UGI (ICD-530.81) EPIDIDYMITIS, LEFT S/P EPIDIMECTOMY (ICD-604.90) PNEUMONIA, BACTERIAL NOS (ICD-482.9) RML resolved.     -spirometry Normal 2009  H/o ITP per Dr. Cyndie Chime 2011, normal platelets 02/2011  [facility]-Dr. Patsi Sears

## 2011-03-30 LAB — BASIC METABOLIC PANEL
CO2: 27 mEq/L (ref 19–32)
Calcium: 8.9 mg/dL (ref 8.4–10.5)
Chloride: 106 mEq/L (ref 96–112)
GFR calc Af Amer: >60 mL/min (ref >60)
Glucose, Bld: 145 mg/dL — ABNORMAL HIGH (ref 70–99)
Potassium: 4 mEq/L (ref 3.5–5.1)
Sodium: 140 mEq/L (ref 135–145)

## 2011-04-02 ENCOUNTER — Telehealth: Payer: Self-pay | Admitting: Cardiology

## 2011-04-02 ENCOUNTER — Telehealth: Payer: Self-pay | Admitting: *Deleted

## 2011-04-02 LAB — CBC
Hemoglobin: 13.3 g/dL (ref 13.0–17.0)
Hemoglobin: 14 g/dL (ref 13.0–17.0)
MCHC: 33.4 g/dL (ref 30.0–36.0)
MCHC: 33.5 g/dL (ref 30.0–36.0)
MCHC: 34.1 g/dL (ref 30.0–36.0)
MCV: 91.3 fL (ref 78.0–100.0)
MCV: 91.5 fL (ref 78.0–100.0)
MCV: 92.2 fL (ref 78.0–100.0)
Platelets: <5 10*3/uL — CL (ref 150–400)
RBC: 4.28 MIL/uL (ref 4.22–5.81)
RBC: 4.32 MIL/uL (ref 4.22–5.81)
RBC: 4.54 MIL/uL (ref 4.22–5.81)
RBC: 4.55 MIL/uL (ref 4.22–5.81)
WBC: 15.6 10*3/uL — ABNORMAL HIGH (ref 4.0–10.5)
WBC: 6.2 10*3/uL (ref 4.0–10.5)
WBC: 7.3 10*3/uL (ref 4.0–10.5)

## 2011-04-02 LAB — ANTI-NUCLEAR AB-TITER (ANA TITER): ANA Titer 1: NEGATIVE

## 2011-04-02 LAB — DIFFERENTIAL
Basophils Relative: 0 % (ref 0–1)
Basophils Relative: 0 % (ref 0–1)
Basophils Relative: 0 % (ref 0–1)
Eosinophils Absolute: 0 10*3/uL (ref 0.0–0.7)
Eosinophils Absolute: 0 10*3/uL (ref 0.0–0.7)
Eosinophils Absolute: 0.2 10*3/uL (ref 0.0–0.7)
Eosinophils Relative: 0 % (ref 0–5)
Lymphocytes Relative: 12 % (ref 12–46)
Lymphocytes Relative: 8 % — ABNORMAL LOW (ref 12–46)
Lymphs Abs: 0.7 10*3/uL (ref 0.7–4.0)
Lymphs Abs: 1.3 10*3/uL (ref 0.7–4.0)
Lymphs Abs: 2 10*3/uL (ref 0.7–4.0)
Monocytes Absolute: 0.9 10*3/uL (ref 0.1–1.0)
Monocytes Absolute: 1.2 10*3/uL — ABNORMAL HIGH (ref 0.1–1.0)
Monocytes Relative: 16 % — ABNORMAL HIGH (ref 3–12)
Monocytes Relative: 2 % — ABNORMAL LOW (ref 3–12)
Monocytes Relative: 4 % (ref 3–12)
Monocytes Relative: 6 % (ref 3–12)
Neutro Abs: 13.5 10*3/uL — ABNORMAL HIGH (ref 1.7–7.7)
Neutro Abs: 5.3 10*3/uL (ref 1.7–7.7)
Neutrophils Relative %: 53 % (ref 43–77)
Neutrophils Relative %: 86 % — ABNORMAL HIGH (ref 43–77)
Neutrophils Relative %: 86 % — ABNORMAL HIGH (ref 43–77)
Neutrophils Relative %: 86 % — ABNORMAL HIGH (ref 43–77)

## 2011-04-02 LAB — RETICULOCYTES
RBC.: 4.3 MIL/uL (ref 4.22–5.81)
RBC.: 4.47 MIL/uL (ref 4.22–5.81)
RBC.: 4.54 MIL/uL (ref 4.22–5.81)
Retic Count, Absolute: 40.9 10*3/uL (ref 19.0–186.0)
Retic Count, Absolute: 53.6 10*3/uL (ref 19.0–186.0)
Retic Ct Pct: 0.9 % (ref 0.4–3.1)

## 2011-04-02 LAB — PROTIME-INR
INR: 1 (ref 0.00–1.49)
Prothrombin Time: 12.8 seconds (ref 11.6–15.2)

## 2011-04-02 LAB — APTT: aPTT: 27 seconds (ref 24–37)

## 2011-04-02 LAB — LACTATE DEHYDROGENASE: LDH: 186 U/L (ref 94–250)

## 2011-04-02 LAB — TYPE AND SCREEN: Antibody Screen: NEGATIVE

## 2011-04-02 NOTE — Telephone Encounter (Signed)
Pt received letter from [facility] that Teveten is no longer available from the manufacturer. Pt is not out of his Teveten. Substitutions suggested were: Brand name Benicar as a first choice.  Micardis as a second choice.  Pt is aware Dr. Daleen Squibb is out of the office until Thursday. I will forward this for review and return call to pt. Mylo Red RN

## 2011-04-02 NOTE — Telephone Encounter (Signed)
Walk in Pt Form : Pt left note from [facility]" sent to Message Nurse for completion  04/02/11/KM

## 2011-04-04 LAB — BASIC METABOLIC PANEL
CO2: 24 mEq/L (ref 19–32)
Calcium: 8.9 mg/dL (ref 8.4–10.5)
Chloride: 105 mEq/L (ref 96–112)
Glucose, Bld: 96 mg/dL (ref 70–99)
Potassium: 3.8 mEq/L (ref 3.5–5.1)
Sodium: 138 mEq/L (ref 135–145)

## 2011-04-04 NOTE — Telephone Encounter (Signed)
What is his dose of Teveten?

## 2011-04-05 ENCOUNTER — Telehealth: Payer: Self-pay | Admitting: *Deleted

## 2011-04-05 DIAGNOSIS — I10 Essential (primary) hypertension: Secondary | ICD-10-CM

## 2011-04-05 MED ORDER — OLMESARTAN MEDOXOMIL 20 MG PO TABS: 20.0000 mg | ORAL_TABLET | Freq: Every day | ORAL | Status: DC

## 2011-04-05 NOTE — Telephone Encounter (Signed)
Pt had sent in form that Teveten needed to be changed b/c no longer carried by pharmacy. Dr. Daleen Squibb reviewed and pt medication changed to Benicar 20 mg qd.  Pt is aware and prescription faxed.  Pt does have enough Teveten until new medication arrives. Mylo Red RN

## 2011-04-16 ENCOUNTER — Telehealth: Payer: Self-pay | Admitting: Cardiology

## 2011-04-16 NOTE — Telephone Encounter (Signed)
Walk in Pt Form " Pt left papers for Wall to Review" Sent to Message Nurse 04/16/11/KM

## 2011-04-30 ENCOUNTER — Encounter: Payer: Self-pay | Admitting: Family Medicine

## 2011-05-02 ENCOUNTER — Encounter: Payer: Self-pay | Admitting: Family Medicine

## 2011-05-02 ENCOUNTER — Ambulatory Visit (INDEPENDENT_AMBULATORY_CARE_PROVIDER_SITE_OTHER): Payer: Medicare Other | Admitting: Family Medicine

## 2011-05-02 DIAGNOSIS — R42 Dizziness and giddiness: Secondary | ICD-10-CM

## 2011-05-02 MED ORDER — MECLIZINE HCL 25 MG PO TABS: 25.0000 mg | ORAL_TABLET | Freq: Three times a day (TID) | ORAL | Status: AC | PRN

## 2011-05-02 NOTE — Assessment & Plan Note (Signed)
Discussed pathophysiology and modes of trmt. Will prescribe Antivert since this has been going on a while. Suggest he Google Vertigo and try exercises. Let me know if not helping. Will send to PT if needed.

## 2011-05-02 NOTE — Progress Notes (Signed)
  Subjective:    Patient ID: James [name], male    DOB: 1928-07-13, 77 y.o.   MRN: [id number]  HPI Pt here with his wife for dizziness. Sun AM he woke up and rolled over and had the room spin out of control.He noticed his digital alarm clock next to his bed spinning. Sxs have waxed and waned since.  By now, if he is sitting and still he is fine but if he tries to do something, he gets dizzy again. Bending over makes things go crazy. HE denies any other neurologic sxs. He is otherwise stable.    Review of SystemsNoncontributory except as above.       Objective:   Physical Exam  Constitutional: He is oriented to person, place, and time. He appears well-developed and well-nourished. No distress.  HENT:  Head: Normocephalic and atraumatic.  Right Ear: External ear normal.  Left Ear: External ear normal.  Nose: Nose normal.  Mouth/Throat: Oropharynx is clear and moist.  Eyes: Conjunctivae and EOM are normal. Pupils are equal, round, and reactive to light. Right eye exhibits no discharge. Left eye exhibits no discharge.  Neck: Normal range of motion. Neck supple.  Cardiovascular: Normal rate and regular rhythm.   Pulmonary/Chest: Effort normal and breath sounds normal. He has no wheezes.  Lymphadenopathy:    He has no cervical adenopathy.  Neurological: He is alert and oriented to person, place, and time. No cranial nerve deficit. He exhibits normal muscle tone. Coordination normal.  Skin: He is not diaphoretic.          Assessment & Plan:

## 2011-05-07 NOTE — Op Note (Signed)
NAME:  James [name], James [name]                 ACCOUNT NO.:  [id number]   MEDICAL RECORD NO.:  [id number]          PATIENT TYPE:  AMB   LOCATION:  DSC                          FACILITY:  [facility]   PHYSICIAN:  Cindee Salt, M.D.       DATE OF BIRTH:  1928-09-14   DATE OF PROCEDURE:  08/15/2009  DATE OF DISCHARGE:                               OPERATIVE REPORT   PREOPERATIVE DIAGNOSIS:  Mucoid tumor, right middle finger.   POSTOPERATIVE DIAGNOSIS:  Mucoid tumor, right middle finger.   OPERATION:  Excision of mucoid tumor, debridement of distal  interphalangeal joint, right middle finger.   SURGEON:  Cindee Salt, MD   ASSISTANT:  Joaquin Courts, RN   ANESTHESIA:  General with local infiltration.   ANESTHESIOLOGIST:  Bedelia Person, MD   HISTORY:  The patient is a 77 year old male with a history of a mucoid  tumor, right middle finger.  This has decompressed.  He is admitted now  for excision with debridement of distal interphalangeal joint.  He is  aware of risks and complications including infection; recurrence injury  to arteries, nerves, tendons, and incomplete relief of symptoms; and  dystrophy.  The possibility of further procedures in the future  including fusion of the joint and possibility of stiffness.  In the  preoperative area, the patient is seen.  The extremity marked by both  the patient and surgeon.  Antibiotic given.  Questions have been  encouraged and answered to his satisfaction with respect to the surgery.   DESCRIPTION OF PROCEDURE:  The patient was brought to the operating room  where a general anesthetic was carried out without difficulty.  He was  prepped using ChloraPrep, supine position with his right arm free.  Time-  out was taken confirming the patient and procedure, and 3-minute dry  time allowed.  General anesthetic had been afforded by Dr. Gypsy Balsam.  The  patient was then draped.  The limb exsanguinated with an Esmarch  bandage, tourniquet placed high on the arm, was  inflated to 250 mmHg.  A  curvilinear incision was made over the distal interphalangeal joint and  carried down through subcutaneous tissue.  A cyst was immediately  encountered with mucinous discharge.  With blunt and sharp dissection,  this was dissected free including portion of skin.  The entire specimen  was sent to Pathology, this was followed down to the distal  interphalangeal joint.  The area opened and the area was then debrided  with a small rongeur.  The joint was then debrided of osteophytes again  with a small rongeur.  The cyst itself was sent to Pathology.  The wound  was irrigated.  The skin undermined, this allowed closure without  difficulty.  A local infiltration was given with Marcaine 0.25%, 9 mL  was used as a metacarpal block.  The wound was then closed with  interrupted 5-0 Vicryl Rapide sutures.  A sterile compressive dressing  and splint to the finger  was applied.  The patient tolerated the procedure well and was taken to  the recovery room for observation in satisfactory  condition.  He will be  discharged home to return to the [facility] [place] in 1 week, on  Vicodin.           ______________________________  Cindee Salt, M.D.     GK/MEDQ  D:  08/15/2009  T:  08/15/2009  Job:  [id number]   cc:   Arta Silence, MD

## 2011-05-07 NOTE — Discharge Summary (Signed)
NAME:  James [name], James [name]                 ACCOUNT NO.:  [id number]   MEDICAL RECORD NO.:  [id number]          PATIENT TYPE:  INP   LOCATION:  1307                         FACILITY:  [facility]   PHYSICIAN:  Genene Churn. Granfortuna, M.D.DATE OF BIRTH:  [birth year]   DATE OF ADMISSION:  05/15/2009  DATE OF DISCHARGE:  05/18/2009                               DISCHARGE SUMMARY   HISTORY:  James [name] is a 77 year old man with hypertension, coronary  artery disease and hyperlipidemia.  He presented with a 3 day history of  leg cramps and then diffuse achiness, and a diffuse petechial skin rash  as well as oropharyngeal bleeding.  Laboratory work showed profound  thrombocytopenia with platelet count less than 20,000.  A CBC at time of  arrival in the [facility] Emergency Department showed a hemoglobin of  14, hematocrit 42, white count 7300, 53 neutrophils, 28 lymphocytes, 16  monocytes and platelet count less than 5000.  History revealed that his only new medication was Septra.  He completed  a 10 day course prior to admission for a low grade infection of one of  his fingers.  No history of use of any quinine containing medications or  beverages.  I was called to evaluate the patient.  In view of the profoud thrombocytopenia with attendant high risk for  spontaneous bleeding,I felt hospital admission was indicated for further  evaluation and treatment.   PHYSICAL EXAMINATION:  VITAL SIGNS:  Blood pressure 143/63, respirations  14, pulse 69 regular, temperature 97.4.  SKIN:  Diffuse petechial rash on the skin including the upper and lower  extremities.  HEENT:  Scattered small submucosal hematomas in the  oropharynx.  No lymphadenopathy.  No organomegaly.  NEUROLOGIC:  No focal neurologic deficits.   HOSPITAL COURSE:  Review of the peripheral blood film revealed a total  absence of platelets.  There were no immature cells to suggest an  underlying hematology disorder such as leukemia.  I felt findings  were  all consistent with ITP.  He was started on parenteral corticosteroids  initially 120 mg of Solu-Medrol, then 1 mg/kg IV daily.  In view of the  profound thrombocytopenia, I elected to try to get a rapid response by  adding WinRho anti-D immunoglobulin 75 mcg/kg given IV over 20 minutes.   Within 24 hours, platelet count was up to 20,000 and by 48 hours up to  50,000.  The patient had no active clinical bleeding.  I felt he was  stable for discharge at that time for outpatient monitoring and changed  to oral steroids.   DIAGNOSTICS:  1. I obtained an ultrasound of his abdomen which did not show any      gross lymphadenopathy and no splenomegaly.  2. An electrocardiogram showed sinus bradycardia, but was otherwise      normal.   LABORATORY DATA:  Additional lab data obtained during this admission  included serum LDH normal at 186,  reticulocyte count normal at 0.9%,  ANA negative, PTT normal at 27 seconds, ESR 11 mm normal.  Rheumatoid  factor negative.  Blood type A Rh negative.  Liver chemistries all  normal with bilirubin 0.7, alkaline phosphatase 72, SGOT 33, SGPT 24,  total protein 6.6, albumin 3.7.   CONSULTATIONS:  None.   PROCEDURE:  None.   DISCHARGE DIAGNOSES:  1. Idiopathic thrombocytopenic purpura, new diagnosis.  2. Severe thrombocytopenia secondary to number 1.  3. Hypertension.  4. Two-vessel coronary disease.  5. Hyperlipidemia.   CONDITION ON DISCHARGE:  Stable.   DISCHARGE INSTRUCTIONS:  1. Resume regular activity.  2. Regular diet.  3. Follow up in my office in 4 days on May 24, 2009.   MEDICATIONS:  1. Prednisone 80 mg daily.  2. Protonix 40 mg daily.  3. Ambien 5-10 mg h.s. p.r.n. sleep.  4. He will hold his aspirin for now until platelet count is normal.  5. Continue hydrochlorothiazide 25 mg daily.  6. Nitrostat 0.4 mg sublingual p.r.n. chest pain.  7. Niacin 1000 mg h.s.  8. Zocor 20 mg h.s.  9. Benicar 20 mg daily.  10.Flovent 2 puffs  b.i.d.      Genene Churn. Cyndie Chime, M.D.  Electronically Signed     JMG/MEDQ  D:  05/18/2009  T:  05/18/2009  Job:  [id number]   cc:   Arta Silence, MD  Fax: [phone]   Noralyn Pick. Eden Emms, MD, [facility]  [address]  [address]  [place]  [place] [address]   Cindee Salt, M.D.  Fax: [phone]   Sigmund I. Patsi Sears, M.D.  Fax: [phone]

## 2011-05-07 NOTE — Op Note (Signed)
NAME:  James [name], James [name]                 ACCOUNT NO.:  [id number]   MEDICAL RECORD NO.:  [id number]          PATIENT TYPE:  AMB   LOCATION:  CARD                         FACILITY:  [facility]   PHYSICIAN:  Charlcie Cradle. Delford Field, MD, FCCPDATE OF BIRTH:  08-Oct-1928   DATE OF PROCEDURE:  09/03/2007  DATE OF DISCHARGE:                               OPERATIVE REPORT   ANESTHESIA:  One percent Xylocaine local.   PREOP MEDICATION:  Fentanyl 50 mcg, Versed 2 mg IV push.   PROCEDURE:  The Pentax videobronchoscope was introduced via the right  naris.  The upper airways were visualized and were unremarkable.  The  lower airway was visualized and showed diffuse tracheobronchitis but no  endobronchial lesions.  Attention was then paid to the right middle  lobe.  Transbronchial biopsies times 6 were obtained.  Bronchial  washings were obtained.   COMPLICATIONS:  None.   IMPRESSION:  Right middle lobe collapse with chronic pneumonia.  Evaluate for cause.   RECOMMENDATIONS:  Follow up microbiology/pathology.      Charlcie Cradle Delford Field, MD, [facility]  Electronically Signed     PEW/MEDQ  D:  09/03/2007  T:  09/04/2007  Job:  [id number]   cc:   Arta Silence, MD  Fax: [phone]

## 2011-05-07 NOTE — Op Note (Signed)
NAME:  James [name], James [name]                 ACCOUNT NO.:  [id number]   MEDICAL RECORD NO.:  [id number]          PATIENT TYPE:  AMB   LOCATION:  DSC                          FACILITY:  [facility]   PHYSICIAN:  Cindee Salt, M.D.       DATE OF BIRTH:  12-30-1927   DATE OF PROCEDURE:  03/07/2009  DATE OF DISCHARGE:                               OPERATIVE REPORT   PREOPERATIVE DIAGNOSIS:  Mucoid tumor, right middle finger.   POSTOPERATIVE DIAGNOSIS:  Mucoid tumor, right middle finger.   OPERATION:  Excision of mucoid tumor with debridement, distal  interphalangeal joint, right middle finger.   SURGEON:  Cindee Salt, MD   ASSISTANT:  Joaquin Courts, RN   ANESTHESIA:  General.   ANESTHESIOLOGIST:  Zenon Mayo, MD   HISTORY:  The patient is a 77 year old male with a history of a mucoid  tumor of his right middle finger.  This has opened on occasion.  He is  desirous of having this removed.  X-rays revealed mild degenerative  changes of the distal interphalangeal joint.  He is aware of risks and  complications including infection, recurrence, injury to arteries,  nerves, tendons, incomplete relief of symptoms, dystrophy, the  possibility of recurrence, degenerative arthritis, and stiffness of the  joint.  In the preoperative area, the patient is seen, the extremity  marked by both the patient and surgeon, and antibiotic given.   PROCEDURE IN DETAIL:  The patient was brought to the operating room  where general anesthetic was carried out without difficulty under the  direction of Dr. Sampson Goon.  He was prepped using DuraPrep, supine  position, right arm free.  A time-out was taken confirming the patient  and the procedure.  The tourniquet placed on the forearm was inflated to  250 mmHg after exsanguination of the limb with an Esmarch bandage.  A  curvilinear incision was made over the distal interphalangeal joint on  the radial aspect, carried down through subcutaneous tissue.  Bleeders  were  electrocauterized.  The cyst was immediately encountered with  mucinous-type fluid.  With blunt and sharp dissection, this was  dissected free followed up to the distal interphalangeal joint.  The  joint was opened and minimal debridement was performed of osteophytes.  Degenerative changes were present.  No further lesions were identified.  The wound was copiously irrigated with saline.  The skin was then closed  with interrupted 5-0 Vicryl Rapide  sutures.  A sterile compressive dressing and splint to the finger was  applied.  The patient tolerated the procedure well and was taken to the  recovery room for observation in satisfactory condition.  He will be  discharged to home to return to the [facility] [place] in 1 week  on Vicodin.           ______________________________  Cindee Salt, M.D.     GK/MEDQ  D:  03/07/2009  T:  03/08/2009  Job:  [id number]   cc:   Arta Silence, MD

## 2011-05-07 NOTE — Assessment & Plan Note (Signed)
[facility]                            CARDIOLOGY OFFICE NOTE   NAME:James [name], James [name]                        MRN:          [id number]  DATE:11/10/2007                            DOB:          01-04-1928    SUBJECTIVE:  Mr. Osborn comes in today for further management of the  following issues:  1. Non-obstructive coronary artery disease.  He is having no angina.      He has his baseline dyspnea on exertion.  A stress Myoview on      November 26, 2006, showed an ejection fraction of 66%, with no      ischemia.  2. Right carotid bruit.  He has non-obstructive plaque by carotid      Dopplers on November 26, 2006.  He is asymptomatic.   CURRENT MEDICATIONS:  1. Teveten 600/25 mg daily.  2. Advicor 500 mg q.h.s.  3. Hydrochlorothiazide 25 mg daily.  4. Metamucil q.h.s.  5. Aspirin 325 mg per night.  6. Glucosamine/chondroitin.   PHYSICAL EXAMINATION:  GENERAL:  He looks remarkably good.  VITAL SIGNS:  Blood pressure 145/76, pulse 73 and regular, weight 272  pounds.  HEENT:  Normocephalic and atraumatic.  Pupils equal, round, reactive to  light and accommodation.  Extraocular movements intact.  Sclerae clear.  He wears glasses.  Facial symmetry is normal.  Dentition satisfactory.  NECK:  Carotid upstrokes equal bilaterally with a right soft bruit.  Thyroid is not enlarged.  Trachea is midline.  LUNGS:  Clear.  HEART:  A poorly-appreciated PMI.  A soft S1 and S2.  ABDOMEN:  Soft with good bowel sounds.  EXTREMITIES:  No edema.  Pulses were 2+/4+ and bilaterally symmetrical.  Toes were warm.  SKIN:  Unremarkable.   His electrocardiogram is normal.   IMPRESSION/PLAN:  Mr. Haile is doing well.  I have made no changes in his  program.   FOLLOWUP:  I will see him back in one year, at which time he will need a  Myoview, as well as carotid Dopplers.    Thomas C. Daleen Squibb, MD, [facility]  Electronically Signed   TCW/MedQ  DD: 11/10/2007  DT: 11/10/2007  Job #:  [id number]   cc:   Arta Silence, MD

## 2011-05-07 NOTE — Op Note (Signed)
NAME:  [name], James                 ACCOUNT NO.:  [id number]   MEDICAL RECORD NO.:  [id number]          PATIENT TYPE:  AMB   LOCATION:  DSC                          FACILITY:  [facility]   PHYSICIAN:  Cindee Salt, M.D.       DATE OF BIRTH:  07/14/1928   DATE OF PROCEDURE:  03/07/2009  DATE OF DISCHARGE:                               OPERATIVE REPORT   PREOPERATIVE DIAGNOSIS:  Mucoid cyst right middle finger.   POSTOPERATIVE DIAGNOSIS:  Mucoid cyst right middle finger.   OPERATION:  Excision mucoid cyst debridement distal interphalangeal  joint right middle finger.   SURGEON:  Cindee Salt, MD   ASSISTANT:  Joaquin Courts, RN   ANESTHESIA:  General.   ANESTHESIOLOGIST:  Zenon Mayo, MD   HISTORY:  The patient is a 77 year old male with a history of a mucoid  tumor of his right middle finger.  He is desirous of having this  removed.  He is aware of risks and complications including infection,  recurrence injury to arteries, nerves, tendons, incomplete relief of  symptoms and dystrophy.  In the preoperative area, the patient is seen.  The extremity marked by both the patient and surgeon.  Antibiotic given.   PROCEDURE:  The patient was brought to the operating room where a  general anesthetic was carried out without difficulty under the  direction of Dr. Sampson Goon.  He was prepped using DuraPrep, supine  position with the right arm free.  A tourniquet placed high on the  forearm was inflated to 250 mmHg after exsanguination of the limb with  an Esmarch bandage.  Time-out was taken confirming the patient and  procedure.  A curvilinear incision was made over the distal  interphalangeal joint mass and carried down through the subcutaneous  tissue.  Significant scarring was present.  With blunt and sharp  dissection, the cyst was encountered with mucinous type fluid.  With  blunt and sharp dissection, this was dissected free followed down to the  distal interphalangeal joint.  The  specimen was sent to pathology.  The  joint was opened.  This was minimally debrided with a rongeur.  No  further lesions were identified.  Degenerative changes were present  within the joint.  The wound was copiously irrigated with saline.  The  skin closed with interrupted 5-0 Vicryl Rapide sutures.  A sterile  compressive dressing and splint to the finger was applied.  The patient  tolerated the procedure well and was taken to the recovery room for  observation in satisfactory condition.  He will be discharged home to  return to the [facility] [place] in 1 week on Vicodin.          ______________________________  Cindee Salt, M.D.    GK/MEDQ  D:  03/07/2009  T:  03/08/2009  Job:  [id number]   cc:   Arta Silence, MD

## 2011-05-07 NOTE — Assessment & Plan Note (Signed)
HEALTHCARE                             PULMONARY OFFICE NOTE   NAME:James [name], James [name]                        MRN:          [id number]  DATE:09/01/2007                            DOB:          12/06/1928    CHIEF COMPLAINT:  Evaluate chronic pneumonia.   HISTORY OF PRESENT ILLNESS:  This is a 77 year old male who noted on  August 08, 2007, the onset of acute joint pain, diffuse arthralgias,  increased cough, chest pain, some degree of shortness of breath, low-  grade fever.  The pain was right-sided in nature. The patient initially  was given a chest x-ray on August 12, 2007, showing a right lower lobe  infiltrate. Subsequent to this was given a 5-day course of Avelox and  referred to cardiology for evaluation.  Cardiology felt there was no  active cardiac issue.  The antibiotics were then extended for 14-day  total course which the patient just completed this week, the Avelox.  He  is still short of breath, still having lethargy.  His chest pain has  resolved.  He denies any fever or sweats but has occasional chills.  He  did cough up some blood that was pure in nature last week from the  patient.  The patient smoked 2 packs a day for 20 years, quit smoking in  1968.  No previous known history of pneumonia or bronchitis.  He is  still short of breath with activity and at rest.  He denies any chest  pain, irregular heart beats, acid indigestion, loss of appetite.  There  has been no change in weight, abdominal pain, or difficulty swallowing.  The patient denies any nasal congestion or difficulty breathing through  the nose.  There is no sneezing, itching, earache, anxiety, depression,  hand or foot swelling.  The patient is referred now for further  evaluation.   PAST MEDICAL HISTORY:  1. Hypertension.  2. Hypercholesterolemia.  3. 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  4. History of appendectomy.   No other positive pertinent history.   MEDICATION ALLERGIES:  PENICILLIN, NEOSPORIN.   SOCIAL HISTORY:  Smoker as noted above.  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   FAMILY HISTORY:  Mother had cancer.  Father had heart disease. Sister  had asthma.   REVIEW OF SYSTEMS:  Otherwise noncontributory.   PHYSICAL EXAMINATION:  GENERAL:  This is an elderly male in no distress.  VITAL SIGNS:  Temperature 97.6, blood pressure 108/58, pulse 63,  saturation 96% on room air.  CHEST: Showed diminished breath sounds with prolonged expiratory phase.  No wheeze, rale, or rhonchi.  CARDIAC:  Exam showed a regular rate and rhythm without S3, normal S1  and S2.  ABDOMEN:  Soft, nontender.  EXTREMITIES:  Showed no edema, clubbing, or venous disease.  SKIN:  Clear.  NEUROLOGIC:  Exam was intact.  HEENT/NECK:  Exam showed no jugular venous disease.  Oropharynx was  clear.  Neck was supple.   LABORATORY DATA:  Chest x-ray obtained today still shows persistent  infiltrate in the right middle  lobe and right lower lobe area,  essentially uncharged from an x-ray from August 12, 2007.   IMPRESSION:  Persisting infiltrate, right middle lobe and right lower  lobe, rule out malignancy until proven otherwise.  Evaluate for  bronchiolitis obliterans, organized pneumonia.   RECOMMENDATIONS:  1. Pursue bronchoscopy on September 04, 2007.  2. Will also pursue CT scanning of the chest on September 03, 2007.  3. Once results of these are available, further recommendations will      follow.     Charlcie Cradle Delford Field, MD, [facility]  Electronically Signed    PEW/MedQ  DD: 09/01/2007  DT: 09/02/2007  Job #: [id number]

## 2011-05-07 NOTE — Assessment & Plan Note (Signed)
[facility]                            CARDIOLOGY OFFICE NOTE   NAME:[name], James DUFFNER                        MRN:          [id number]  DATE:10/26/2008                            DOB:          04-02-1928    James [name] returns today for followup.  He is having no angina or ischemic  symptoms.  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   PROBLEM LIST:  1. Nonobstructive coronary artery disease.  His last stress Myoview      was on November 26, 2006.  2. Normal left ventricular systolic function.  3. Right carotid bruit.  He had nonobstructive plaque by carotids on      November 26, 2006.  He is asymptomatic.   CURRENT MEDICATIONS:  Unchanged, except for the fact that he is now on  Simcor 1000/20 nightly.   PHYSICAL EXAMINATION:  VITAL SIGNS:  His blood pressure today is 146/58,  his pulse is 66 and regular, and his weight is 273.  HEENT:  Unchanged and normal.  NECK:  Carotid upstrokes were equal bilaterally without bruits.  No JVD.  Thyroid is not enlarged.  Trachea is midline.  LUNGS:  Clear to auscultation and percussion.  HEART:  A regular rate and rhythm.  ABDOMEN:  Soft, good bowel sounds.  No midline bruit.  No pulsatile  mass.  EXTREMITIES:  No cyanosis, clubbing, or edema.  Pulses are intact.   LABORATORY DATA:  From Dr. Lorenza Chick office revealed an LDL of 65.1,  normal LFTs, and HDL 49.2.  EKG in our office today shows sinus rhythm  with some PACs.  There has been no acute changes.   ASSESSMENT AND PLAN:  James [name] is doing well.  We will arrange for an  adenosine Myoview and carotid Dopplers. Assuming these are stable, I  will see him back in a year.     Thomas C. Daleen Squibb, MD, [facility]  Electronically Signed    TCW/MedQ  DD: 10/26/2008  DT: 10/27/2008  Job #: [id number]

## 2011-05-07 NOTE — Assessment & Plan Note (Signed)
[facility]                             PULMONARY OFFICE NOTE   NAME:James [name], James [name]                        MRN:          [id number]  DATE:09/07/2007                            DOB:          06/22/1928    James [name]'s results from his bronchoscopy are seen today and shows  chronic and acute inflammation but not malignancy.  All micro-data are  negative.   PLAN:  Plan will be to begin prednisone 40 mg a day with a rapid taper  and Asmanex 2 sprays daily.  We will see the patient back the following  week.     Charlcie Cradle Delford Field, MD, [facility]  Electronically Signed    PEW/MedQ  DD: 09/07/2007  DT: 09/07/2007  Job #: [id number]

## 2011-05-07 NOTE — Assessment & Plan Note (Signed)
[facility]                             PULMONARY OFFICE NOTE   NAME:James [name], James [name]                        MRN:          [id number]  DATE:09/15/2007                            DOB:          [birth year]    HISTORY:  James [name] is a 77 year old white male with chronic right middle  lobe atelectasis, recurrent pneumonia, here for persistent cough, status  post bronchoscopy.  The patient underwent a bronchoscopy one week ago,  results showing acute and chronic inflammation.  No endobronchial  lesion.  Cultures were negative for active infection.  The patient is  short of breath still at all times.  He is quite weak.  He is finishing  a course of pulse prednisone.  The patient also maintains Asmanex two  sprays daily.  He notes some mild degree of heartburn.  He is taking  fish oil two twice daily.   PHYSICAL EXAMINATION:  VITAL SIGNS:  Temperature 97 degrees, blood  pressure 120/80, pulse 62, saturation 97% on room air.  CHEST:  Distant breath sounds with no wheeze or rhonchi.  HEART:  Showed a regular rate and rhythm without S3.  Normal S1 and S2.  ABDOMEN:  Soft, nontender.  EXTREMITIES:  Showed no edema or clubbing.   A chest x-ray was obtained today and showed improvement in aeration in  the right middle lobe, with decreased infiltrate.   IMPRESSION:  Chronic atelectasis with chronic infiltrate, right middle  lobe.  Negative cultures.  No endobronchial lesion on bronchoscopy.  Underlying reflux disease with cyclic cough.   PLAN:  To wean off prednisone rapidly.  Continue Asmanex two sprays  daily.  Get cough suppression with tramadol and Tessalon Perles.  Begin  Zegerid for a 30-day interval, one capsule daily.  Samples were given.  Discontinue further fish oil administration.   FOLLOWUP:  Return in three weeks for a recheck.     Charlcie Cradle Delford Field, MD, [facility]  Electronically Signed    PEW/MedQ  DD: 09/15/2007  DT: 09/15/2007  Job #: [id number]

## 2011-05-07 NOTE — H&P (Signed)
NAME:  James [name], James [name]                 ACCOUNT NO.:  [id number]   MEDICAL RECORD NO.:  [id number]          PATIENT TYPE:  INP   LOCATION:  0101                         FACILITY:  [facility]   PHYSICIAN:  Genene Churn. Granfortuna, M.D.DATE OF BIRTH:  10-31-1928   DATE OF ADMISSION:  05/15/2009  DATE OF DISCHARGE:                              HISTORY & PHYSICAL   CHIEF COMPLAINT:  Sudden onset of oropharyngeal bleeding and a petechial  rash.   Mr. Moskal is a 77 year old man with hypertension, two-vessel coronary  disease controlled on medication and hyperlipidemia.  Over the last 3  days, he developed initially severe cramps in his calves and then  generalized pain and achiness throughout his body.  Last night, he  noticed some low-grade gum bleeding when he brushed his teeth and  flossed.  This morning, he woke up, and he had blood in his mouth.  He  noticed a diffuse petechial rash on his skin.  He had a mild headache  earlier in the week but no headache today.  He denied any epistaxis,  hematuria, melena or hematochezia.  He was treated for a low-grade  infection of the right index finger where he had surgery back in March  2010 and was on oral Septra and completed a 10-day course yesterday.  He  took some Advil over the weekend because of the generalized muscle  aches.  He has not had any fever, no chills, no earache, sore throat,  cough, dysuria or frequency.  He saw his internist this morning.  Blood  work was done, and he was told he had a very low platelet count and was  referred to the emergency department for further evaluation.  A CBC here  shows hemoglobin 14, hematocrit 42, MCV 92, white count 7300, 53  neutrophils, 28 lymphocytes, 16 monocytes and platelet count less than  5000.  On my review of the peripheral blood, there are no visible  platelets.  The neutrophils are mature.  There are no blasts or early  myeloid forms.  Lymphocytes appear benign with an occasional reactive  cell.   Red cells are normochromic normocytic with no schistocytes and no  spherocytes.  Diagnosis is ITP until proven otherwise.  He is admitted at this time to  initiate treatment.   PAST MEDICAL HISTORY:  Two-vessel coronary disease, angioplasty in 2005.  Disease in the LAD and circumflex.  Normal ejection fraction.  Dr.  Eden Emms, cardiologist.  Hypertension.  Hyperlipidemia.  Known right  carotid bruit.  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  Remote history of kidney stones in 1950.  Degenerative arthritis.  Left shoulder surgery.  History of recurrent  pneumonia and right middle lobe collapse, status post bronchoscopy by  Dr. Shan Levans on September 03, 2007.  No cancer found.  Bilateral  cataract surgery.  Bilateral eyebrow lift procedure.  Bilateral hernia  repair.  Removal of a growth from his left testicle in the past.  Remote  appendectomy in 1949.  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 Merlyn Lot March 01, 2009.  He does have some chronic asthma.  Stopped smoking in 1968.  No history of diabetes, tuberculosis, MI,  hepatitis, yellow jaundice, malaria, mononucleosis, seizure, stroke,  thyroid trouble, blood clots.   CURRENT MEDICATIONS:  1. Nitrostat 0.4 mg sublingual p.r.n. chest pain.  2. Hydrochlorothiazide 25 mg daily.  3. Travatan 600 mg daily.  4. Simcor 1000/20 one daily.  5. Asmanex 2 puffs daily, 1 puff b.i.d.   ALLERGY:  To penicillin with rash and swelling of his feet many years  ago, topical allergy to Neosporin.   FAMILY HISTORY:  He is one of twelve children.  Nobody with any blood  disorders except his wife.   SOCIAL HISTORY:  He was career Company secretary.  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  Stopped smoking in 1968.  No alcohol.  No beverages  containing quinine.   REVIEW OF SYSTEMS:  In addition to the HPI, episode of ataxia  back in  December 2009, normal MRI of the brain, no stroke or bleed.  No recent  infectious exposures.  No quinine beverages.  No current dyspnea.  No  chest pain or pressure.  No abdominal pain.  No nausea, vomiting or  diarrhea.  He has been on a clinical trial through Dr. Cecille Rubin office where he gets some kind of a nerve stimulation  procedure where an electrode is put into his leg and apparently  stimulates his bladder.  He has this done every 2 weeks.   PHYSICAL EXAMINATION:  GENERAL:  Shows an overweight Caucasian man in no  acute distress.  VITAL SIGNS:  Pulse 69 and regular, blood pressure 145/63, respirations  14?  Temperature 97.4, oxygen saturation 96% on room air.  SKIN:  On the skin, there is a diffuse petechial rash on the upper and  lower extremities.  HEENT:  The pupils are equal, reactive to light.  I could not get a good  look at the fundi due to poor cooperation with exam.  No gross  hemorrhages.  Pharynx with scattered small submucosal hematomas.  NECK:  Supple.  LUNGS:  Clear and resonant to percussion.  HEART:  Regular cardiac rhythm.  No murmur.  LYMPHATIC:  No lymphadenopathy.  ABDOMEN:  Soft, obese, point tenderness in the left lower quadrant.  No  palpable mass or organomegaly.  EXTREMITIES:  No edema.  No calf tenderness.  NEUROLOGIC:  Mental status intact.  Cranial nerves intact.  Motor  strength 5/5.  Reflexes absent symmetric at the knees, 1+ symmetric at  the biceps.  Upper body coordination normal.  Gait not tested.   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   Review of the peripheral blood film does not reveal any suspicion for an  underlying leukemic process.  Normal hemoglobin and white count support  that this is likely ITP.  Although it is possible that this is a drug  reaction to Septra, this would be very unusual with this drug.   PLAN:  1. Begin parenteral steroids with initially Solu-Medrol 1 mg/kg daily.   2. WinRho anti-D immunoglobulin 75 mcg/kg IV over 20 minutes in an      attempt to get a rapid response in view of the profound      thrombocytopenia.  In my experience, it takes older people much      longer to respond to steroids.  I do not feel any gross splenomegaly on my exam, but he is obese, so I  will get an ultrasound of his abdomen to rule out splenomegaly.      Genene Churn. Cyndie Chime, M.D.  Electronically Signed     JMG/MEDQ  D:  05/15/2009  T:  05/15/2009  Job:  [id number]   cc:   Arta Silence, MD  Fax: [phone]   Noralyn Pick. Eden Emms, MD, [facility]  [address]  [address]  [place]  [place] [address]   Cindee Salt, M.D.  Fax: [phone]   Sigmund I. Patsi Sears, M.D.  Fax: [phone]

## 2011-05-07 NOTE — Assessment & Plan Note (Signed)
[facility]                             PULMONARY OFFICE NOTE   NAME:James [name], James [name]                        MRN:          [id number]  DATE:10/12/2007                            DOB:          01/10/1928    James [name] returns in followup with history of reflux disease, right  middle lobe atelectasis, pneumonia.  Negative bronchoscopy September  2009.  Overall, he is improved with decreased cough, decreased shortness  of breath.  He is finishing a course of Asmanex and Zegerid.  He is now  off the fish oil.   EXAM:  Temperature 97, blood pressure 126/72, pulse 73, saturation 95%  on room air.  CHEST:  Showed to be clear without evidence of wheeze or rhonchi.  CARDIAC:  Regular rate and rhythm without S3.  Normal S1, S2.  ABDOMEN:  Soft, nontender.   IMPRESSION:  Right middle lobe atelectasis, which is resolved.  Pneumonia is resolved.   PLAN:  The patient is to receive an influenza vaccine today and he will  finish his course of Asmanex and Zegerid, and then return to this office  in 4 months.     Charlcie Cradle Delford Field, MD, [facility]  Electronically Signed    PEW/MedQ  DD: 10/12/2007  DT: 10/13/2007  Job #: [id number]   cc:   Arta Silence, MD

## 2011-05-07 NOTE — Assessment & Plan Note (Signed)
[facility]                                 ON-CALL NOTE   NAME:[name], James CASHER                        MRN:          [id number]  DATE:09/05/2007                            DOB:          [birth year]    TIME OF CALL:  10:45 a.m.   HOME PHONE:  [phone].   PROBLEM:  This patient of Dr. Delford Field developed hemoptysis this morning,  slowing gradually.  Had had hemoptysis a week or so ago, leading to  bronchoscopy by Dr. Delford Field 2 days ago.  He took aspirin last night.  I  was able to pull up his bronchoscopy report, CT scan report and  pathology report while we were on the phone with findings consistent  with a chronic pneumonia and atelectasis in the right middle lobe but no  evidence for mass, cancer or specific infection.  Since bleeding was  slowing and he had just finished Avelox I recommended that he take no  more aspirin, use leftover Tussionex and limit activity.  He is to  follow up with Dr. Delford Field on Monday as scheduled.     Clinton D. Maple Hudson, MD, [facility], FACP  Electronically Signed    CDY/MedQ  DD: 09/05/2007  DT: 09/06/2007  Job #: [id number]   cc:   Charlcie Cradle. Delford Field, MD, FCCP

## 2011-05-07 NOTE — Assessment & Plan Note (Signed)
[facility]                            CARDIOLOGY OFFICE NOTE   NAME:Erven, YAIR DUSZA                        MRN:          [id number]  DATE:08/12/2007                            DOB:          12-Feb-1928    Mr. Salada comes over from Dr. Ermalene Searing today as an add on for chest pain.   Over the past weekend, he began to have aching in his right shoulder,  and just did not feel well.  Today, he has had progressive increased  problems with taking a breath and getting a sharp right upper anterior  chest pain.  He denies any hemoptysis or hematemesis.  He has had a dry  cough.  He denies any fever.  He has some diffuse aches in his joints.   EKG at Dr. Daphine Deutscher office showed no acute changes.  He does have  nonobstructive coronary disease, and was sent over for that.   He had an x-ray after seeing Dr. Ermalene Searing, which showed a right middle  lobe pneumonia.  He has a small effusion.   His medications include:  1.Multivivtamin daily.  1. Metamucil daily.  2. Hydrochlorothiazide 25 mg a day.  3. Avicor 500/20 two nightly.  4. Teveten 600 mg a day.  5. Omega 3 daily.  6. Aspirin 325 mg daily.   EXAMINATION:  He is very uncomfortable.  His respiratory rate is about 20 and shallow.  When he takes a deep  breath he coughs and hurts in his anterior chest.  His blood pressure is  120/59.  Pulse 88 and regular.  His weight is 267.  EKG is normal, except for a PAC.  SKIN:  Warm and dry.  HEENT:  Unchanged.  Carotid upstrokes were equal bilaterally without bruits.  There was no  obvious lymphadenopathy.  Thyroid is not enlarged.  Trachea is midline.  He has adventitial sounds anteriorly in the right middle lobe and right  upper lobe, as well as posteriorly.  There is no rub.  HEART:  Reveals a nondisplaced PMI.  Normal S1 and S2.  No rub.  ABDOMINAL EXAM:  Soft with good bowel sounds.  No midline bruit.  EXTREMITIES:  Reveal no edema.  Pulses are intact.  There is  no sign of  DVT.  NEUROLOGIC:  Exam is intact.   ASSESSMENT:  Right middle lobe pneumonia with pleuritic chest pain.   I do not think this is coronary chest discomfort.   I called Dr. Ermalene Searing.  We both agree that he needs antibiotics, which  she will call in.  We also agree that he needs a cough suppressant such  as Tussionex, which she will call in.  I put him on Advil over-the-  counter 600 mg p.o. with meals and at bedtime until his pleuritic chest  pain resolves.     Thomas C. Daleen Squibb, MD, [facility]  Electronically Signed    TCW/MedQ  DD: 08/12/2007  DT: 08/13/2007  Job #: [id number]   cc:   Kerby Nora, MD

## 2011-05-10 NOTE — Cardiovascular Report (Signed)
NAME:  James [name], James [name]                           ACCOUNT NO.:  [id number]   MEDICAL RECORD NO.:  [id number]                   PATIENT TYPE:  OIB   LOCATION:  2857                                 FACILITY:  [facility]   PHYSICIAN:  Charlton Haws, M.D.                  DATE OF BIRTH:  05-Nov-1928   DATE OF PROCEDURE:  05/23/2004  DATE OF DISCHARGE:  05/23/2004                              CARDIAC CATHETERIZATION   INDICATION:  Increasing exertional dyspnea with Cardiolite suggesting mild  inferior wall ischemia.   PROCEDURE:  Standard catheterization was done from the right femoral artery.  The patient was sedated with 25 of Fentanyl and 2 of Versed.  His blood  pressure was slightly elevated and he got 1.25 of enalaprilat during the  case.   The left main coronary artery was normal.   Left anterior descending artery had an area of calcification proximally with  a 40% tubular lesion.  Mid and distal vessel were normal.   Circumflex coronary artery was large and left dominant.  There was a 50%  tubular lesion in the mid vessel.  This was not flow-limiting.  Distal  vessel had 30% multiple discrete lesions.   The right coronary artery was small and nondominant.  It was normal.   RAO VENTRICULOGRAPHY:  RAO ventriculography was normal.  EF was 60%.  There  was no gradient across the aortic valve and no MR.   PRESSURES:  Aortic pressure was 175/13, LV pressure was 180/87.   IMPRESSION:  The films were reviewed with Dr. Riley Kill.  We both agreed that  the circumflex lesion is not flow-limiting.  The patient has not had any  significant angina.  His exertional dyspnea is most likely due to chronic  obstructive pulmonary disease and his excessive weight.  The patient stopped  smoking in 1968.  His chest x-ray shows some scarring at the bases and  chronic obstructive pulmonary disease .  He will follow up with Dr. Hetty Ely  and possibly have PFTs and further workup for this.  Weight loss will be  key  to symptomatic relief of his dyspnea.   Given the fact that the patient does have some plaque in his coronary  arteries, he should be on a statin drug.  His daughter is a drug rep and can  get Advicor for the patient free.  I would stop his Tricor and start him on  Advicor.  He will need followup LFTs and cholesterol after starting this  medication.   This was all explained to the family.  As long as his leg heals well he will  be discharged later today.  Charlton Haws, M.D.    PN/MEDQ  D:  05/23/2004  T:  05/24/2004  Job:  [id number]   cc:   Laurita Quint, M.D.  [address] [place]  [place] [address]  Fax: [phone]   Jesse Sans. Wall, M.D.

## 2011-05-27 ENCOUNTER — Other Ambulatory Visit (INDEPENDENT_AMBULATORY_CARE_PROVIDER_SITE_OTHER): Payer: Medicare Other | Admitting: Family Medicine

## 2011-05-27 DIAGNOSIS — D693 Immune thrombocytopenic purpura: Secondary | ICD-10-CM

## 2011-05-27 LAB — CBC WITH DIFFERENTIAL/PLATELET
Basophils Relative: 0.5 % (ref 0.0–3.0)
Eosinophils Relative: 0.8 % (ref 0.0–5.0)
HCT: 40.6 % (ref 39.0–52.0)
Hemoglobin: 13.4 g/dL (ref 13.0–17.0)
Lymphocytes Relative: 29.9 % (ref 12.0–46.0)
Lymphs Abs: 2.1 10*3/uL (ref 0.7–4.0)
Monocytes Relative: 10.1 % (ref 3.0–12.0)
Neutro Abs: 4.2 10*3/uL (ref 1.4–7.7)
RBC: 4.38 Mil/uL (ref 4.22–5.81)
WBC: 7.1 10*3/uL (ref 4.5–10.5)

## 2011-05-30 ENCOUNTER — Encounter: Payer: Self-pay | Admitting: Family Medicine

## 2011-05-30 ENCOUNTER — Ambulatory Visit (INDEPENDENT_AMBULATORY_CARE_PROVIDER_SITE_OTHER): Payer: Medicare Other | Admitting: Family Medicine

## 2011-05-30 DIAGNOSIS — I10 Essential (primary) hypertension: Secondary | ICD-10-CM

## 2011-05-30 DIAGNOSIS — D696 Thrombocytopenia, unspecified: Secondary | ICD-10-CM | POA: Insufficient documentation

## 2011-05-30 NOTE — Assessment & Plan Note (Signed)
Well controlled. CBC nml. Lab Results  Component Value Date   WBC 7.1 05/27/2011   HGB 13.4 05/27/2011   HCT 40.6 05/27/2011   MCV 92.7 05/27/2011   PLT 212.0 05/27/2011

## 2011-05-30 NOTE — Assessment & Plan Note (Signed)
Good control. Cont curr meds. BP Readings from Last 3 Encounters:  05/30/11 120/60  05/02/11 122/68  01/02/11 122/60

## 2011-05-30 NOTE — Patient Instructions (Signed)
RTC 6 months for Comp Exam, labs prior

## 2011-05-30 NOTE — Assessment & Plan Note (Signed)
Again discussed trying to decrease.

## 2011-05-30 NOTE — Progress Notes (Signed)
  Subjective:    Patient ID: James [name], male    DOB: February 17, 1928, 77 y.o.   MRN: [id number]  HPI Pt here for 6 month followup altho seen last month for vertigo. He found exercises for the vertigo on the web and they worked well. He had no luck with medication. HE feels well today with no complaints. He is receiving the Order of the [employer] from [employer] on [address]! HE has had no unuasual bruising or bleeding.    Review of Systems Noncontributory except as above.       Objective:   Physical Exam  Constitutional: He appears well-developed and well-nourished. No distress.  HENT:  Head: Normocephalic and atraumatic.  Right Ear: External ear normal.  Left Ear: External ear normal.  Nose: Nose normal.  Mouth/Throat: Oropharynx is clear and moist.  Eyes: Conjunctivae and EOM are normal. Pupils are equal, round, and reactive to light. Right eye exhibits no discharge. Left eye exhibits no discharge.  Neck: Normal range of motion. Neck supple.  Cardiovascular: Normal rate and regular rhythm.   Pulmonary/Chest: Effort normal and breath sounds normal. He has no wheezes.  Lymphadenopathy:    He has no cervical adenopathy.  Skin: Skin is warm and dry. No rash noted. He is not diaphoretic. No erythema. No pallor.       No bruising noted, even from blood draw site. Excision site from previous skin lesion well healed.          Assessment & Plan:

## 2011-06-11 ENCOUNTER — Encounter (HOSPITAL_BASED_OUTPATIENT_CLINIC_OR_DEPARTMENT_OTHER): Payer: Medicare Other | Admitting: Oncology

## 2011-06-11 ENCOUNTER — Other Ambulatory Visit: Payer: Self-pay | Admitting: Oncology

## 2011-06-11 DIAGNOSIS — D693 Immune thrombocytopenic purpura: Secondary | ICD-10-CM

## 2011-06-11 DIAGNOSIS — Z23 Encounter for immunization: Secondary | ICD-10-CM

## 2011-06-11 LAB — CBC WITH DIFFERENTIAL/PLATELET
BASO%: 0.3 % (ref 0.0–2.0)
LYMPH%: 26.1 % (ref 14.0–49.0)
MCHC: 33.6 g/dL (ref 32.0–36.0)
MCV: 91.8 fL (ref 79.3–98.0)
MONO%: 11.8 % (ref 0.0–14.0)
NEUT%: 60.7 % (ref 39.0–75.0)
Platelets: 210 10*3/uL (ref 140–400)
RBC: 4.36 10*6/uL (ref 4.20–5.82)

## 2011-07-10 ENCOUNTER — Encounter: Payer: Self-pay | Admitting: Family Medicine

## 2011-07-10 DIAGNOSIS — C439 Malignant melanoma of skin, unspecified: Secondary | ICD-10-CM | POA: Insufficient documentation

## 2011-07-30 ENCOUNTER — Ambulatory Visit: Payer: Medicare Other | Admitting: Cardiology

## 2011-08-13 ENCOUNTER — Encounter: Payer: Self-pay | Admitting: Cardiology

## 2011-08-29 ENCOUNTER — Encounter: Payer: Self-pay | Admitting: Family Medicine

## 2011-09-03 ENCOUNTER — Encounter: Payer: Self-pay | Admitting: Cardiology

## 2011-09-03 ENCOUNTER — Ambulatory Visit (INDEPENDENT_AMBULATORY_CARE_PROVIDER_SITE_OTHER): Payer: Medicare Other | Admitting: Cardiology

## 2011-09-03 VITALS — BP 116/58 | HR 62 | Ht 72.0 in | Wt 269.0 lb

## 2011-09-03 DIAGNOSIS — R0989 Other specified symptoms and signs involving the circulatory and respiratory systems: Secondary | ICD-10-CM

## 2011-09-03 DIAGNOSIS — I2510 Atherosclerotic heart disease of native coronary artery without angina pectoris: Secondary | ICD-10-CM

## 2011-09-03 DIAGNOSIS — I10 Essential (primary) hypertension: Secondary | ICD-10-CM

## 2011-09-03 NOTE — Patient Instructions (Addendum)
Your physician recommends that you schedule a follow-up appointment in: 1 year with Dr. Daleen Squibb  Your physician has requested that you have a carotid duplex. This test is an ultrasound of the carotid arteries in your neck. It looks at blood flow through these arteries that supply the brain with blood. Allow one hour for this exam. There are no restrictions or special instructions. January  2014

## 2011-09-03 NOTE — Progress Notes (Signed)
HPI James [name] returns today for evaluation and management of his coronary artery disease and right carotid bruit.  He's had no symptoms of angina or ischemia. Denies any symptoms of TIAs or mini strokes. Blood pressures in the lab data is the goal.  He is a lot of stress his wife being very sick. He sleeps very little.  His EKG today shows normal sinus rhythm, normal EKG. Past Medical History  Diagnosis Date  . Bradycardia     Hypertensive  . carotid bruit, right   . hypercholesterolemia     Trig 234/293  . Hypertension, benign   . Obesity, morbid   . Vasculitis   . Immune thrombocytopenic purpura     Dr. Cyndie Chime  . Dermatitis     legs  . Blood in stool   . Bleeding gums   . Chronic low back pain   . Myalgia and myositis   . Unspecified vitamin D deficiency   . Bladder diverticulum   . Primary osteoarthritis of both knees   . Anxiety   . Nephrolithiasis   . Malignant melanoma of ear     right  . History of elevated glucose   . Thrombophlebitis     right forearm  . History of BPH   . History of colonic polyps     [name]  . GERD (gastroesophageal reflux disease)     via UGI  . Epididymitis, left     S/P Epidimectomy  . Pneumonia, bacterial     RML resolved, spirometry, normal  . History of ITP 2011    per Dr. Cyndie Chime 2011, normal platelets  . Thrombocytopenia 05/20-05/27/10    Hosp ITP severe, Dr. Cyndie Chime  . Splenomegaly 05/15/09    abd U/S NML    Past Surgical History  Procedure Date  . Excision of mucoid tumor   . Kidney stone retrieval 1950's  . Fem cutaneous nerve entrapment     due to surgery (mild lat anesth of bilat legs)  . Herniorraphy     x 2 right  . Appendectomy   . Limited pelvis/hip bone scan 04/99    negative  . Bone scan 04/00  . Cataract extraction 12/03    left  . Cataract extraction 02/04    righ  . Wide local excision, right ear 06/04    melanoma with flap  . Stress cardiolite 05/11/04    mild inf. ischemia, EF  71%  . Cardiac catheterization 05/23/04    mild plague-statin  . Left shoulder surgery 06/17/2005  . Bronchoscopy 09/08    RML collapse with chronic pneumonia, bx neg (Dr. Delford Field)  . Right middle finger cyst excision 03/07/09    DIP Joint Mucoid (Dr. Merlyn Lot)  . Eyelid & eyebrow lift   . Onion peele, left testical   . Right middle finger cyst reexcision 08/15/09    DIP Joint Mucoid Cyst (Dr. Merlyn Lot)    Family History  Problem Relation Age of Onset  . Cancer Mother     ? thyroid  . Diabetes Father   . Heart disease Father     MI  . Stroke Brother     cerebral hemm  . Cancer Other     colon, ? brother    History   Social History  . Marital Status: Married    Spouse Name: N/A    Number of Children: 2  . Years of Education: N/A   Occupational History  . Retired     1996 [employer]   Social History Main Topics  . Smoking status: Former Games developer  . Smokeless tobacco: Never Used   Comment: Quit in 1968  . Alcohol Use: Yes     rarely`  . Drug Use: No  . Sexually Active: Not on file   Other Topics Concern  . Not on file   Social History Narrative   Married and lives with wifeChildren, 2 out of homePatient does not get regular exerciseNo caffeine    Allergies  Allergen Reactions  . Penicillins     REACTION: Rash, swelling  . Rofecoxib   . Sulfamethoxazole W/Trimethoprim     REACTION: questional onset of low platelets and bleeding  . Tramadol Hcl     REACTION: dizziness, drowsiness    Current Outpatient Prescriptions  Medication Sig Dispense Refill  . aspirin 81 MG tablet Take 81 mg by mouth daily.        . Cholecalciferol (VITAMIN D3) 2000 UNITS capsule Take 2,000 Units by mouth daily.        . furosemide (LASIX) 40 MG tablet Take 2 by mouth every day       . meclizine (ANTIVERT) 25 MG tablet Take 1 tablet (25 mg total) by mouth 3 (three) times daily as needed for dizziness or nausea.  30 tablet  1  . Multiple Vitamin (MULTIVITAMIN) tablet Take 1  tablet by mouth daily.        . niacin-simvastatin (SIMCOR) 1000-20 MG 24 hr tablet Take 1 tablet by mouth at bedtime.        . nitroGLYCERIN (NITROSTAT) 0.4 MG SL tablet Place 0.4 mg under the tongue every 5 (five) minutes as needed. For a total of 3 tablets       . olmesartan (BENICAR) 20 MG tablet Take 1 tablet (20 mg total) by mouth daily.  90 tablet  3  . Probiotic Product (PROBIOTIC PO) Take by mouth daily.        . Psyllium-Calcium (METAMUCIL PLUS CALCIUM) CAPS Take by mouth daily.        Marland Kitchen telmisartan (MICARDIS) 20 MG tablet Take 20 mg by mouth daily.          ROS Negative other than HPI.   PE General Appearance: well developed, well nourished in no acute distress HEENT: symmetrical face, PERRLA, good dentition  Neck: no JVD, thyromegaly, or adenopathy, trachea midline Chest: symmetric without deformity Cardiac: PMI non-displaced, RRR, normal S1, S2, no gallop or murmur Lung: clear to ausculation and percussion Vascular: all pulses full , right carotid bruit Abdominal: nondistended, nontender, good bowel sounds, no HSM, no bruits Extremities: no cyanosis, clubbing or edema, no sign of DVT, no varicosities  Skin: normal color, no rashes Neuro: alert and oriented x 3, non-focal Pysch: normal affect Filed Vitals:   09/03/11 0924  BP: 116/58  Pulse: 62  Height: 6' (1.829 m)  Weight: 269 lb (122.018 kg)    EKG  Labs and Studies Reviewed.   Lab Results  Component Value Date   WBC 7.1 05/27/2011   HGB 13.4 06/11/2011   HCT 40.0 06/11/2011   MCV 91.8 06/11/2011   PLT 210 06/11/2011      Chemistry      Component Value Date/Time   NA 141 11/27/2010 0938   K 4.2 11/27/2010 0938   CL 106 11/27/2010 0938   CO2 25 11/27/2010 0938   BUN 11 11/27/2010 0938   CREATININE 0.79 11/27/2010 0938      Component Value Date/Time   CALCIUM 9.0 11/27/2010  0938   ALKPHOS 77 11/27/2010 0938   AST 20 11/27/2010 0938   ALT 19 11/27/2010 0938   BILITOT 0.6 11/27/2010 0938       Lab Results    Component Value Date   CHOL 130 09/19/2010   CHOL 142 02/22/2010   CHOL 126 02/21/2009   Lab Results  Component Value Date   HDL 43.90 09/19/2010   HDL 16.10 02/22/2010   HDL 40.8 02/21/2009   Lab Results  Component Value Date   LDLCALC 55 09/19/2010   LDLCALC 61 02/21/2009   LDLCALC 46 08/31/2007   Lab Results  Component Value Date   TRIG 158.0* 09/19/2010   TRIG 256.0* 02/22/2010   TRIG 123 02/21/2009   Lab Results  Component Value Date   CHOLHDL 3 09/19/2010   CHOLHDL 3 02/22/2010   CHOLHDL 3.1 CALC 02/21/2009   Lab Results  Component Value Date   HGBA1C 6.3 06/28/2009   Lab Results  Component Value Date   ALT 19 11/27/2010   AST 20 11/27/2010   ALKPHOS 77 11/27/2010   BILITOT 0.6 11/27/2010   Lab Results  Component Value Date   TSH 4.25 02/22/2010

## 2011-09-03 NOTE — Assessment & Plan Note (Signed)
Stable. Recheck in January of 2014.

## 2011-09-03 NOTE — Assessment & Plan Note (Signed)
Stable. Change in medications.

## 2011-10-04 LAB — AFB CULTURE WITH SMEAR (NOT AT ARMC): Acid Fast Smear: NONE SEEN

## 2011-10-04 LAB — P CARINII SMEAR DFA: Pneumocystis carinii DFA: NEGATIVE

## 2011-10-04 LAB — FUNGUS CULTURE W SMEAR

## 2011-10-04 LAB — CULTURE, RESPIRATORY W GRAM STAIN

## 2011-10-15 ENCOUNTER — Telehealth: Payer: Self-pay | Admitting: Cardiology

## 2011-10-15 ENCOUNTER — Other Ambulatory Visit: Payer: Self-pay | Admitting: *Deleted

## 2011-10-15 MED ORDER — FUROSEMIDE 40 MG PO TABS: 40.0000 mg | ORAL_TABLET | Freq: Every day | ORAL | Status: DC

## 2011-10-15 NOTE — Telephone Encounter (Signed)
Walk In pt Form " Pt needs Medication Refill" sent to Debbie/Wall  10/15/11/km

## 2011-10-21 ENCOUNTER — Other Ambulatory Visit: Payer: Self-pay | Admitting: Oncology

## 2011-10-21 ENCOUNTER — Other Ambulatory Visit: Payer: Self-pay | Admitting: *Deleted

## 2011-10-21 LAB — CBC WITH DIFFERENTIAL/PLATELET
BASO%: 0.5 % (ref 0.0–2.0)
Eosinophils Absolute: 0.1 10*3/uL (ref 0.0–0.5)
MCHC: 33 g/dL (ref 32.0–36.0)
MCV: 92.6 fL (ref 79.3–98.0)
MONO#: 1 10*3/uL — ABNORMAL HIGH (ref 0.1–0.9)
MONO%: 12.7 % (ref 0.0–14.0)
NEUT#: 4.8 10*3/uL (ref 1.5–6.5)
RBC: 4.43 10*6/uL (ref 4.20–5.82)
RDW: 14.5 % (ref 11.0–14.6)
WBC: 8.2 10*3/uL (ref 4.0–10.3)

## 2011-10-21 MED ORDER — FUROSEMIDE 40 MG PO TABS: ORAL_TABLET | ORAL | Status: DC

## 2011-11-19 ENCOUNTER — Other Ambulatory Visit: Payer: Self-pay | Admitting: Family Medicine

## 2011-11-19 DIAGNOSIS — I10 Essential (primary) hypertension: Secondary | ICD-10-CM

## 2011-11-25 ENCOUNTER — Other Ambulatory Visit (INDEPENDENT_AMBULATORY_CARE_PROVIDER_SITE_OTHER): Payer: Medicare Other

## 2011-11-25 DIAGNOSIS — E559 Vitamin D deficiency, unspecified: Secondary | ICD-10-CM

## 2011-11-25 DIAGNOSIS — I10 Essential (primary) hypertension: Secondary | ICD-10-CM

## 2011-11-25 LAB — COMPREHENSIVE METABOLIC PANEL
AST: 24 U/L (ref 0–37)
Albumin: 3.6 g/dL (ref 3.5–5.2)
BUN: 13 mg/dL (ref 6–23)
CO2: 28 mEq/L (ref 19–32)
Calcium: 8.7 mg/dL (ref 8.4–10.5)
Chloride: 106 mEq/L (ref 96–112)
Creatinine, Ser: 0.8 mg/dL (ref 0.4–1.5)
GFR: 98.51 mL/min (ref >60.00)
Glucose, Bld: 104 mg/dL — ABNORMAL HIGH (ref 70–99)

## 2011-11-25 LAB — LIPID PANEL
Cholesterol: 140 mg/dL (ref 0–200)
LDL Cholesterol: 63 mg/dL (ref 0–99)
Triglycerides: 116 mg/dL (ref 0.0–149.0)

## 2011-11-25 NOTE — Progress Notes (Signed)
Addended by: Alvina Chou on: 11/25/2011 08:49 AM   Modules accepted: Orders

## 2011-11-26 LAB — VITAMIN D 25 HYDROXY (VIT D DEFICIENCY, FRACTURES): Vit D, 25-Hydroxy: 37 ng/mL (ref 30–89)

## 2011-11-27 ENCOUNTER — Ambulatory Visit (INDEPENDENT_AMBULATORY_CARE_PROVIDER_SITE_OTHER): Payer: Medicare Other | Admitting: Family Medicine

## 2011-11-27 ENCOUNTER — Encounter: Payer: Self-pay | Admitting: Family Medicine

## 2011-11-27 DIAGNOSIS — F411 Generalized anxiety disorder: Secondary | ICD-10-CM

## 2011-11-27 DIAGNOSIS — Z87898 Personal history of other specified conditions: Secondary | ICD-10-CM

## 2011-11-27 DIAGNOSIS — E559 Vitamin D deficiency, unspecified: Secondary | ICD-10-CM

## 2011-11-27 DIAGNOSIS — Z862 Personal history of diseases of the blood and blood-forming organs and certain disorders involving the immune mechanism: Secondary | ICD-10-CM

## 2011-11-27 DIAGNOSIS — Z0000 Encounter for general adult medical examination without abnormal findings: Secondary | ICD-10-CM

## 2011-11-27 DIAGNOSIS — I10 Essential (primary) hypertension: Secondary | ICD-10-CM

## 2011-11-27 DIAGNOSIS — D696 Thrombocytopenia, unspecified: Secondary | ICD-10-CM

## 2011-11-27 DIAGNOSIS — M1710 Unilateral primary osteoarthritis, unspecified knee: Secondary | ICD-10-CM

## 2011-11-27 DIAGNOSIS — I2510 Atherosclerotic heart disease of native coronary artery without angina pectoris: Secondary | ICD-10-CM

## 2011-11-27 DIAGNOSIS — R609 Edema, unspecified: Secondary | ICD-10-CM

## 2011-11-27 NOTE — Assessment & Plan Note (Signed)
Adequate on current replacement. Continue.

## 2011-11-27 NOTE — Assessment & Plan Note (Signed)
Discussed. Don't see this getting better. Told to decrease caloric intake.

## 2011-11-27 NOTE — Patient Instructions (Signed)
RTC 6 months with Dr Para March for get acquainted and followup. WHEN CONGESTED, Take Guaifenesin (400mg ), take 11/2 tabs by mouth AM and NOON. Get GUAIFENESIN by  going to [facility] or [facility] and getting MUCOUS RELIEF EXPECTORANT/CONGESTION. DO NOT GET MUCINEX (Timed Release Guaifenesin)  For knees, take Glucsamine and Chondoirin and Hyaluronic Acid. You can get all three in a product called Osteo BiFlex. Don't exprect a change for 3-6 months!!

## 2011-11-27 NOTE — Assessment & Plan Note (Signed)
Stable but still elevated slightly at 104.

## 2011-11-27 NOTE — Assessment & Plan Note (Signed)
Stable and well controlled. 

## 2011-11-27 NOTE — Assessment & Plan Note (Signed)
Stable. Cont curr trmt.

## 2011-11-27 NOTE — Assessment & Plan Note (Signed)
Back nm; and stable there.

## 2011-11-27 NOTE — Progress Notes (Signed)
Subjective:    Patient ID: James [name], male    DOB: 1928/07/26, 77 y.o.   MRN: [id number]  HPI Pt here for Comp Exam. His weight continues to climb. HE saw Dr Daleen Squibb in Sep and was stable. I last saw him for vertigo  His wife continues to be real sick. He worries a lot and loses sleep over it. She has been seen at the cancer center since 5/04 and has had many meds thru them. He has congestive sxs today. He also has significant left knee pain which Dr Ranell Patrick tells him will require a replacement to alleviate.     Review of Systems  Constitutional: Negative for fever, chills, diaphoresis, appetite change, fatigue and unexpected weight change.  HENT: Positive for tinnitus (since in the AF.). Negative for hearing loss, ear pain and ear discharge.   Eyes: Negative for pain, discharge and visual disturbance.  Respiratory: Positive for shortness of breath (chronic, no progression. ). Negative for cough and wheezing.   Cardiovascular: Negative for chest pain and palpitations.       No SOB w/ exertion  Gastrointestinal: Negative for nausea, vomiting, abdominal pain, diarrhea, constipation and blood in stool.       No heartburn or swallowing problems.  Genitourinary: Negative for dysuria, frequency and difficulty urinating.       Continued urologic detrusor instability, gets Tens treatments regularly which helps. Sees Dr Patsi Sears.  Musculoskeletal: Negative for myalgias, back pain and arthralgias.  Skin: Negative for rash.       No itching or dryness.  Neurological: Negative for tremors and numbness.       No tingling or balance problems.  Hematological: Negative for adenopathy. Does not bruise/bleed easily.  Psychiatric/Behavioral: Negative for dysphoric mood and agitation.       Objective:   Physical Exam  Constitutional: He is oriented to person, place, and time. He appears well-developed and well-nourished. No distress.       Obese.  HENT:  Head: Normocephalic and atraumatic.  Right  Ear: External ear normal.  Left Ear: External ear normal.  Nose: Nose normal.  Mouth/Throat: Oropharynx is clear and moist.  Eyes: Conjunctivae and EOM are normal. Pupils are equal, round, and reactive to light. Right eye exhibits no discharge. Left eye exhibits no discharge. No scleral icterus.  Neck: Normal range of motion. Neck supple. No thyromegaly present.  Cardiovascular: Normal rate, regular rhythm, normal heart sounds and intact distal pulses.   No murmur heard. Pulmonary/Chest: Effort normal and breath sounds normal. No respiratory distress. He has no wheezes.  Abdominal: Soft. Bowel sounds are normal. He exhibits no distension and no mass. There is no tenderness. There is no rebound and no guarding.  Genitourinary: Penis normal.       Rectal exam not done.  Musculoskeletal: Normal range of motion. He exhibits no edema.  Lymphadenopathy:    He has no cervical adenopathy.  Neurological: He is alert and oriented to person, place, and time. Coordination normal.  Skin: Skin is warm and dry. No rash noted. He is not diaphoretic.       Echymotic abrasion left dorsal hand.   Psychiatric: He has a normal mood and affect. His behavior is normal. Judgment and thought content normal.          Assessment & Plan:  HMPE  I have personally reviewed the Medicare Annual Wellness questionnaire and have noted 1. The patient's medical and social history 2. Their use of alcohol, tobacco or illicit drugs 3. Their  current medications and supplements 4. The patient's functional ability including ADL's, fall risks, home safety risks and hearing or visual             impairment. 5. Diet and physical activities 6. Evidence for depression or mood disorders

## 2011-11-27 NOTE — Assessment & Plan Note (Signed)
Stable. Wife very sick but he seems stable with this.

## 2011-11-27 NOTE — Assessment & Plan Note (Signed)
Adequate control. Cont curr meds. BP Readings from Last 3 Encounters:  11/27/11 128/60  09/03/11 116/58  05/30/11 120/60

## 2011-11-27 NOTE — Assessment & Plan Note (Signed)
Continues to bother him. Try Glucosamine and Chondroitin with Hyaluronic Acid.

## 2011-11-27 NOTE — Assessment & Plan Note (Signed)
Stable. Sees Dr Daleen Squibb regularly.

## 2011-12-05 ENCOUNTER — Telehealth: Payer: Self-pay | Admitting: Internal Medicine

## 2011-12-05 ENCOUNTER — Encounter: Payer: Self-pay | Admitting: Family Medicine

## 2011-12-05 NOTE — Telephone Encounter (Signed)
Letter written and copy of most recent labs sent along.

## 2011-12-05 NOTE — Telephone Encounter (Signed)
I will defer to Dr. Hetty Ely on this, as he saw the patient.

## 2011-12-05 NOTE — Telephone Encounter (Signed)
Patient was seen last Wednesday the 5th and he needs a letter stating he is cleared for surgery on 1.11.13 for replacement of left knee by Dr. Beverely Low at [facility].

## 2011-12-05 NOTE — Telephone Encounter (Signed)
Patient's wife notified that letter and copy of labs are up front ready for pickup.

## 2011-12-06 ENCOUNTER — Telehealth: Payer: Self-pay

## 2011-12-06 ENCOUNTER — Telehealth: Payer: Self-pay | Admitting: Cardiology

## 2011-12-06 NOTE — Telephone Encounter (Signed)
Pt is scheduled for a knee replacement on 01/03/12 with Dr Beverely Low at [facility].  He needs a surgical clearance faxed to Dr Ranell Patrick when cleared at [phone].  Phone number for Dr Ranell Patrick is [phone].  Please call pt and let him know if he needs an appt.  If no appt needed, pt is requesting a call after fax has been sent.

## 2011-12-06 NOTE — Telephone Encounter (Signed)
Walk In pt Form " Scheduled for Knee Replacement, [facility] needs Surgical Clearacne Sent to Message Nurse  12/06/11/km

## 2011-12-11 NOTE — Telephone Encounter (Signed)
LMOVM clearance was faxed this morning. Mylo Red RN

## 2011-12-30 NOTE — Pre-Procedure Instructions (Signed)
20 CAISON HEARN  12/30/2011   Your procedure is scheduled on:  Fri,Jan 18 @ 1030  Report to [facility] at 0830 AM.  Call this number if you have problems the morning of surgery: [phone]   Remember:   Do not eat food:After Midnight.  May have clear liquids: up to 4 Hours before arrival.(until 4:30 am)  Clear liquids include soda, tea, black coffee, apple or grape juice, broth.  Take these medicines the morning of surgery with A SIP OF WATER: Meclizine(if needed)   Do not wear jewelry, make-up or nail polish.  Do not wear lotions, powders, or perfumes. You may wear deodorant.  Do not shave 48 hours prior to surgery.  Do not bring valuables to the hospital.  Contacts, dentures or bridgework may not be worn into surgery.  Leave suitcase in the car. After surgery it may be brought to your room.  For patients admitted to the hospital, checkout time is 11:00 AM the day of discharge.   Patients discharged the day of surgery will not be allowed to drive home.  Name and phone number of your driver:   Special Instructions: CHG Shower Use Special Wash: 1/2 bottle night before surgery and 1/2 bottle morning of surgery.   Please read over the following fact sheets that you were given: Pain Booklet, Coughing and Deep Breathing, Blood Transfusion Information, Total Joint Packet, MRSA Information and Surgical Site Infection Prevention

## 2011-12-31 ENCOUNTER — Telehealth: Payer: Self-pay | Admitting: Cardiology

## 2011-12-31 ENCOUNTER — Encounter (HOSPITAL_COMMUNITY): Payer: Self-pay | Admitting: Pharmacy Technician

## 2011-12-31 ENCOUNTER — Encounter (HOSPITAL_COMMUNITY)
Admission: RE | Admit: 2011-12-31 | Discharge: 2011-12-31 | Disposition: A | Discharge status: Home / Self Care | Payer: Medicare Other | Source: Ambulatory Visit | Attending: Orthopedic Surgery | Admitting: Orthopedic Surgery

## 2011-12-31 ENCOUNTER — Encounter (HOSPITAL_COMMUNITY): Payer: Self-pay

## 2011-12-31 ENCOUNTER — Encounter (HOSPITAL_COMMUNITY)
Admission: RE | Admit: 2011-12-31 | Discharge: 2011-12-31 | Disposition: A | Discharge status: Home / Self Care | Payer: Medicare Other | Source: Ambulatory Visit | Attending: Anesthesiology | Admitting: Anesthesiology

## 2011-12-31 ENCOUNTER — Other Ambulatory Visit: Payer: Self-pay

## 2011-12-31 HISTORY — DX: Personal history of other diseases of the digestive system: Z87.19

## 2011-12-31 HISTORY — DX: Spontaneous ecchymoses: R23.3

## 2011-12-31 HISTORY — DX: Frequency of micturition: R35.0

## 2011-12-31 HISTORY — DX: Nocturia: R35.1

## 2011-12-31 HISTORY — DX: Unspecified osteoarthritis, unspecified site: M19.90

## 2011-12-31 HISTORY — DX: Other skin changes: R23.8

## 2011-12-31 HISTORY — DX: Polyneuropathy, unspecified: G62.9

## 2011-12-31 HISTORY — DX: Urgency of urination: R39.15

## 2011-12-31 HISTORY — DX: Unspecified urinary incontinence: R32

## 2011-12-31 LAB — BASIC METABOLIC PANEL
CO2: 28 mEq/L (ref 19–32)
Calcium: 9.5 mg/dL (ref 8.4–10.5)
Creatinine, Ser: 0.82 mg/dL (ref 0.50–1.35)
GFR calc non Af Amer: 81 mL/min — ABNORMAL LOW (ref >90)
Sodium: 140 mEq/L (ref 135–145)

## 2011-12-31 LAB — CBC
MCH: 30.6 pg (ref 26.0–34.0)
MCV: 93.3 fL (ref 78.0–100.0)
Platelets: 217 10*3/uL (ref 150–400)
RBC: 4.77 MIL/uL (ref 4.22–5.81)
RDW: 14.6 % (ref 11.5–15.5)
WBC: 10.1 10*3/uL (ref 4.0–10.5)

## 2011-12-31 LAB — TYPE AND SCREEN: ABO/RH(D): A NEG

## 2011-12-31 MED ORDER — TELMISARTAN 20 MG PO TABS: 20.0000 mg | ORAL_TABLET | Freq: Every day | ORAL | Status: DC

## 2011-12-31 NOTE — Progress Notes (Addendum)
Dr.Thomas Wall is cardiologist -last office visit is in epic  EKG in epic done 09/03/11

## 2011-12-31 NOTE — Telephone Encounter (Signed)
Walk In pt Form " Pt needs Medications" sent to Message Nurse 1//8/13/KM

## 2012-01-01 NOTE — H&P (Signed)
CC: Knee pain left HPI:   78  Y/o male with worsening left knee pain secondary to osteoarthritis. Patient has elected to have a total knee arthroplasty to decrease pain and increase function. ZOX:[id number], hyperlipidemia Family History:CAD Social:Dr. Para March, occasional etoh, no smoking Meds:aspirin, lasix, multivitamin, simcor, probiotic, micardis, cholecalciferol, meclizine, nitrostat Allergies: penicillin, septra, neosporin ROS: Pain with ambulation Vitals:74 16 156/76 PE: Alert and appropriate  78 y/o male  In no acute distress Cranial 2-12 grossly intact Cervical spine full rom without pain Lungs: bilateral breath sounds with no wheeze rhonchi or rales Heart: regular rate and rhythm with no murmur Abd: nontender nondistended with active bowel sounds Ext: moderate left pain with rom of knee with crepitus, antalgic gait, neurovascularly intact distally. No pedal edema Skin: no rashes X-rays: endstage osteoarthritis to knee A/P: endstage osteoarthritis to knee Plan for total knee arthroplasty to decrease pain and increase function.

## 2012-01-09 MED ORDER — CHLORHEXIDINE GLUCONATE 4 % EX LIQD: 60.0000 mL | Freq: Once | CUTANEOUS | Status: DC

## 2012-01-09 MED ORDER — SODIUM CHLORIDE 0.9 % IV SOLN
1500.0000 mg | INTRAVENOUS | Status: AC
  Administered 2012-01-10: 1500 mg via INTRAVENOUS
  Filled 2012-01-09: qty 1500

## 2012-01-10 ENCOUNTER — Encounter (HOSPITAL_COMMUNITY): Payer: Self-pay

## 2012-01-10 ENCOUNTER — Ambulatory Visit (HOSPITAL_COMMUNITY): Payer: Medicare Other

## 2012-01-10 ENCOUNTER — Encounter (HOSPITAL_COMMUNITY)
Admission: RE | Disposition: A | Discharge status: Home Health | Payer: Self-pay | Source: Ambulatory Visit | Attending: Orthopedic Surgery

## 2012-01-10 ENCOUNTER — Inpatient Hospital Stay (HOSPITAL_COMMUNITY)
Admission: RE | Admit: 2012-01-10 | Discharge: 2012-01-14 | DRG: 470 | Disposition: A | Discharge status: Home Health | Payer: Medicare Other | Source: Ambulatory Visit | Attending: Orthopedic Surgery | Admitting: Orthopedic Surgery

## 2012-01-10 ENCOUNTER — Encounter (HOSPITAL_COMMUNITY): Payer: Self-pay | Admitting: *Deleted

## 2012-01-10 DIAGNOSIS — M1710 Unilateral primary osteoarthritis, unspecified knee: Principal | ICD-10-CM | POA: Diagnosis present

## 2012-01-10 DIAGNOSIS — Z8249 Family history of ischemic heart disease and other diseases of the circulatory system: Secondary | ICD-10-CM

## 2012-01-10 DIAGNOSIS — Z87442 Personal history of urinary calculi: Secondary | ICD-10-CM

## 2012-01-10 DIAGNOSIS — M19049 Primary osteoarthritis, unspecified hand: Secondary | ICD-10-CM | POA: Diagnosis present

## 2012-01-10 DIAGNOSIS — Z79899 Other long term (current) drug therapy: Secondary | ICD-10-CM

## 2012-01-10 DIAGNOSIS — Z8679 Personal history of other diseases of the circulatory system: Secondary | ICD-10-CM

## 2012-01-10 DIAGNOSIS — Z860100 Personal history of colon polyps, unspecified: Secondary | ICD-10-CM

## 2012-01-10 DIAGNOSIS — I10 Essential (primary) hypertension: Secondary | ICD-10-CM | POA: Diagnosis present

## 2012-01-10 DIAGNOSIS — Z8547 Personal history of malignant neoplasm of testis: Secondary | ICD-10-CM

## 2012-01-10 DIAGNOSIS — K449 Diaphragmatic hernia without obstruction or gangrene: Secondary | ICD-10-CM | POA: Diagnosis present

## 2012-01-10 DIAGNOSIS — M545 Low back pain, unspecified: Secondary | ICD-10-CM | POA: Diagnosis present

## 2012-01-10 DIAGNOSIS — Z8701 Personal history of pneumonia (recurrent): Secondary | ICD-10-CM

## 2012-01-10 DIAGNOSIS — G4730 Sleep apnea, unspecified: Secondary | ICD-10-CM | POA: Diagnosis present

## 2012-01-10 DIAGNOSIS — G8929 Other chronic pain: Secondary | ICD-10-CM | POA: Diagnosis present

## 2012-01-10 DIAGNOSIS — I2510 Atherosclerotic heart disease of native coronary artery without angina pectoris: Secondary | ICD-10-CM | POA: Diagnosis present

## 2012-01-10 DIAGNOSIS — Z7982 Long term (current) use of aspirin: Secondary | ICD-10-CM

## 2012-01-10 DIAGNOSIS — E7800 Pure hypercholesterolemia, unspecified: Secondary | ICD-10-CM | POA: Diagnosis present

## 2012-01-10 DIAGNOSIS — R42 Dizziness and giddiness: Secondary | ICD-10-CM | POA: Diagnosis present

## 2012-01-10 DIAGNOSIS — N390 Urinary tract infection, site not specified: Secondary | ICD-10-CM | POA: Diagnosis not present

## 2012-01-10 DIAGNOSIS — B961 Klebsiella pneumoniae [K. pneumoniae] as the cause of diseases classified elsewhere: Secondary | ICD-10-CM | POA: Diagnosis not present

## 2012-01-10 DIAGNOSIS — G709 Myoneural disorder, unspecified: Secondary | ICD-10-CM | POA: Diagnosis present

## 2012-01-10 DIAGNOSIS — K219 Gastro-esophageal reflux disease without esophagitis: Secondary | ICD-10-CM | POA: Diagnosis present

## 2012-01-10 DIAGNOSIS — E559 Vitamin D deficiency, unspecified: Secondary | ICD-10-CM | POA: Diagnosis present

## 2012-01-10 DIAGNOSIS — Z880 Allergy status to penicillin: Secondary | ICD-10-CM

## 2012-01-10 DIAGNOSIS — F411 Generalized anxiety disorder: Secondary | ICD-10-CM | POA: Diagnosis present

## 2012-01-10 DIAGNOSIS — Z888 Allergy status to other drugs, medicaments and biological substances status: Secondary | ICD-10-CM

## 2012-01-10 DIAGNOSIS — Z85820 Personal history of malignant melanoma of skin: Secondary | ICD-10-CM

## 2012-01-10 HISTORY — PX: TOTAL KNEE ARTHROPLASTY: SHX125

## 2012-01-10 LAB — URINALYSIS, ROUTINE W REFLEX MICROSCOPIC
Glucose, UA: NEGATIVE mg/dL
Protein, ur: 100 mg/dL — AB
Urobilinogen, UA: 1 mg/dL (ref 0.0–1.0)

## 2012-01-10 LAB — URINE MICROSCOPIC-ADD ON

## 2012-01-10 LAB — PROTIME-INR: INR: 0.95 (ref 0.00–1.49)

## 2012-01-10 SURGERY — ARTHROPLASTY, KNEE, TOTAL
Anesthesia: General | Site: Knee | Laterality: Left | Wound class: Clean

## 2012-01-10 MED ORDER — FENTANYL CITRATE 0.05 MG/ML IJ SOLN
INTRAMUSCULAR | Status: AC
  Filled 2012-01-10: qty 2

## 2012-01-10 MED ORDER — NIACIN-SIMVASTATIN ER 1000-20 MG PO TB24: 1.0000 | ORAL_TABLET | Freq: Every day | ORAL | Status: DC

## 2012-01-10 MED ORDER — MIDAZOLAM HCL 2 MG/2ML IJ SOLN
INTRAMUSCULAR | Status: AC
  Filled 2012-01-10: qty 2

## 2012-01-10 MED ORDER — PSYLLIUM 95 % PO PACK
1.0000 | PACK | Freq: Every day | ORAL | Status: DC
  Administered 2012-01-11 – 2012-01-14 (×4): 1 via ORAL
  Filled 2012-01-10 (×5): qty 1

## 2012-01-10 MED ORDER — WARFARIN SODIUM 2.5 MG PO TABS
2.5000 mg | ORAL_TABLET | Freq: Once | ORAL | Status: AC
  Administered 2012-01-10: 2.5 mg via ORAL
  Filled 2012-01-10: qty 1

## 2012-01-10 MED ORDER — METHOCARBAMOL 500 MG PO TABS
500.0000 mg | ORAL_TABLET | Freq: Four times a day (QID) | ORAL | Status: DC | PRN
  Administered 2012-01-10 – 2012-01-13 (×6): 500 mg via ORAL
  Filled 2012-01-10 (×7): qty 1

## 2012-01-10 MED ORDER — OSTEO BI-FLEX JOINT SHIELD PO TABS: 1.0000 | ORAL_TABLET | Freq: Two times a day (BID) | ORAL | Status: DC

## 2012-01-10 MED ORDER — METOCLOPRAMIDE HCL 5 MG/ML IJ SOLN
INTRAMUSCULAR | Status: DC | PRN
Start: 2012-01-10 — End: 2012-01-10
  Administered 2012-01-10 (×2): 10 mg via INTRAVENOUS

## 2012-01-10 MED ORDER — PROPOFOL 10 MG/ML IV EMUL
INTRAVENOUS | Status: DC | PRN
  Administered 2012-01-10: 200 mg via INTRAVENOUS

## 2012-01-10 MED ORDER — NIACIN ER 500 MG PO CPCR
1000.0000 mg | ORAL_CAPSULE | Freq: Every day | ORAL | Status: DC
  Administered 2012-01-10 – 2012-01-13 (×4): 1000 mg via ORAL
  Filled 2012-01-10 (×6): qty 2

## 2012-01-10 MED ORDER — ASPIRIN 81 MG PO TABS: 81.0000 mg | ORAL_TABLET | Freq: Every evening | ORAL | Status: DC

## 2012-01-10 MED ORDER — FENTANYL CITRATE 0.05 MG/ML IJ SOLN
50.0000 ug | INTRAMUSCULAR | Status: DC | PRN
  Administered 2012-01-10: 100 ug via INTRAVENOUS

## 2012-01-10 MED ORDER — ACETAMINOPHEN 650 MG RE SUPP: 650.0000 mg | Freq: Four times a day (QID) | RECTAL | Status: DC | PRN

## 2012-01-10 MED ORDER — GUAIFENESIN 200 MG PO TABS
400.0000 mg | ORAL_TABLET | Freq: Every day | ORAL | Status: DC
  Administered 2012-01-10 – 2012-01-14 (×19): 400 mg via ORAL
  Filled 2012-01-10 (×27): qty 2

## 2012-01-10 MED ORDER — 0.9 % SODIUM CHLORIDE (POUR BTL) OPTIME
TOPICAL | Status: DC | PRN
  Administered 2012-01-10: 1000 mL

## 2012-01-10 MED ORDER — FENTANYL CITRATE 0.05 MG/ML IJ SOLN
INTRAMUSCULAR | Status: DC | PRN
  Administered 2012-01-10: 100 ug via INTRAVENOUS
  Administered 2012-01-10: 150 ug via INTRAVENOUS
  Administered 2012-01-10: 100 ug via INTRAVENOUS

## 2012-01-10 MED ORDER — METHOCARBAMOL 100 MG/ML IJ SOLN
500.0000 mg | Freq: Four times a day (QID) | INTRAVENOUS | Status: DC | PRN
  Administered 2012-01-10: 500 mg via INTRAVENOUS
  Filled 2012-01-10: qty 5

## 2012-01-10 MED ORDER — NEOSTIGMINE METHYLSULFATE 1 MG/ML IJ SOLN
INTRAMUSCULAR | Status: DC | PRN
  Administered 2012-01-10: 3 mg via INTRAVENOUS

## 2012-01-10 MED ORDER — BUPIVACAINE HCL (PF) 0.5 % IJ SOLN
INTRAMUSCULAR | Status: DC | PRN
  Administered 2012-01-10: 25 mL

## 2012-01-10 MED ORDER — NITROGLYCERIN 0.4 MG SL SUBL: 0.4000 mg | SUBLINGUAL_TABLET | SUBLINGUAL | Status: DC | PRN

## 2012-01-10 MED ORDER — MECLIZINE HCL 25 MG PO TABS
25.0000 mg | ORAL_TABLET | Freq: Three times a day (TID) | ORAL | Status: DC | PRN
  Filled 2012-01-10: qty 1

## 2012-01-10 MED ORDER — OLMESARTAN 10 MG HALF TABLET
10.0000 mg | ORAL_TABLET | Freq: Every day | ORAL | Status: DC
  Administered 2012-01-10 – 2012-01-14 (×5): 10 mg via ORAL
  Filled 2012-01-10 (×5): qty 1

## 2012-01-10 MED ORDER — ASPIRIN EC 81 MG PO TBEC
81.0000 mg | DELAYED_RELEASE_TABLET | Freq: Every evening | ORAL | Status: DC
  Administered 2012-01-10 – 2012-01-13 (×4): 81 mg via ORAL
  Filled 2012-01-10 (×5): qty 1

## 2012-01-10 MED ORDER — LACTATED RINGERS IV SOLN
INTRAVENOUS | Status: DC | PRN
  Administered 2012-01-10 (×2): via INTRAVENOUS

## 2012-01-10 MED ORDER — BISACODYL 10 MG RE SUPP
10.0000 mg | Freq: Every day | RECTAL | Status: DC | PRN
  Filled 2012-01-10: qty 1

## 2012-01-10 MED ORDER — ONE-DAILY MULTI VITAMINS PO TABS
1.0000 | ORAL_TABLET | Freq: Every day | ORAL | Status: DC
  Administered 2012-01-11 – 2012-01-14 (×4): 1 via ORAL
  Filled 2012-01-10 (×6): qty 1

## 2012-01-10 MED ORDER — HYDROMORPHONE HCL PF 1 MG/ML IJ SOLN
0.2500 mg | INTRAMUSCULAR | Status: DC | PRN
  Administered 2012-01-10 (×3): 0.5 mg via INTRAVENOUS

## 2012-01-10 MED ORDER — GUAIFENESIN 400 MG PO TABS
400.0000 mg | ORAL_TABLET | ORAL | Status: DC
  Filled 2012-01-10 (×7): qty 1

## 2012-01-10 MED ORDER — OXYCODONE-ACETAMINOPHEN 5-325 MG PO TABS
1.0000 | ORAL_TABLET | ORAL | Status: DC | PRN
  Administered 2012-01-10 (×2): 1 via ORAL
  Administered 2012-01-11 (×3): 2 via ORAL
  Administered 2012-01-11: 1 via ORAL
  Administered 2012-01-11 – 2012-01-14 (×7): 2 via ORAL
  Filled 2012-01-10 (×12): qty 2
  Filled 2012-01-10: qty 1

## 2012-01-10 MED ORDER — METOCLOPRAMIDE HCL 5 MG/ML IJ SOLN: 10.0000 mg | Freq: Once | INTRAMUSCULAR | Status: DC | PRN

## 2012-01-10 MED ORDER — GLYCOPYRROLATE 0.2 MG/ML IJ SOLN
INTRAMUSCULAR | Status: DC | PRN
  Administered 2012-01-10: .4 mg via INTRAVENOUS

## 2012-01-10 MED ORDER — PATIENT'S GUIDE TO USING COUMADIN BOOK
Freq: Once | Status: DC
  Filled 2012-01-10: qty 1

## 2012-01-10 MED ORDER — METOCLOPRAMIDE HCL 10 MG PO TABS: 5.0000 mg | ORAL_TABLET | Freq: Three times a day (TID) | ORAL | Status: DC | PRN

## 2012-01-10 MED ORDER — MENTHOL 3 MG MT LOZG: 1.0000 | LOZENGE | OROMUCOSAL | Status: DC | PRN

## 2012-01-10 MED ORDER — VITAMIN D3 50 MCG (2000 UT) PO CAPS
2000.0000 [IU] | ORAL_CAPSULE | Freq: Every day | ORAL | Status: DC
  Filled 2012-01-10 (×3): qty 1

## 2012-01-10 MED ORDER — SODIUM CHLORIDE 0.9 % IR SOLN
Status: DC | PRN
  Administered 2012-01-10: 3000 mL

## 2012-01-10 MED ORDER — FUROSEMIDE 80 MG PO TABS
80.0000 mg | ORAL_TABLET | Freq: Every day | ORAL | Status: DC
  Administered 2012-01-10 – 2012-01-14 (×5): 80 mg via ORAL
  Filled 2012-01-10 (×5): qty 1

## 2012-01-10 MED ORDER — POTASSIUM CHLORIDE IN NACL 20-0.9 MEQ/L-% IV SOLN
INTRAVENOUS | Status: DC
  Administered 2012-01-10 – 2012-01-12 (×5): via INTRAVENOUS
  Filled 2012-01-10 (×10): qty 1000

## 2012-01-10 MED ORDER — MORPHINE SULFATE 2 MG/ML IJ SOLN: 0.0500 mg/kg | INTRAMUSCULAR | Status: DC | PRN

## 2012-01-10 MED ORDER — LIDOCAINE HCL (PF) 1 % IJ SOLN
INTRAMUSCULAR | Status: DC | PRN
  Administered 2012-01-10: 2 mL

## 2012-01-10 MED ORDER — WARFARIN VIDEO: Freq: Once | Status: DC

## 2012-01-10 MED ORDER — VANCOMYCIN HCL IN DEXTROSE 1-5 GM/200ML-% IV SOLN
1000.0000 mg | Freq: Two times a day (BID) | INTRAVENOUS | Status: AC
  Administered 2012-01-10: 1000 mg via INTRAVENOUS
  Filled 2012-01-10: qty 200

## 2012-01-10 MED ORDER — LIDOCAINE HCL (CARDIAC) 20 MG/ML IV SOLN
INTRAVENOUS | Status: DC | PRN
  Administered 2012-01-10: 60 mg via INTRAVENOUS

## 2012-01-10 MED ORDER — ROCURONIUM BROMIDE 100 MG/10ML IV SOLN
INTRAVENOUS | Status: DC | PRN
  Administered 2012-01-10: 50 mg via INTRAVENOUS

## 2012-01-10 MED ORDER — ONDANSETRON HCL 4 MG/2ML IJ SOLN
4.0000 mg | Freq: Four times a day (QID) | INTRAMUSCULAR | Status: DC | PRN
  Administered 2012-01-11: 4 mg via INTRAVENOUS
  Filled 2012-01-10: qty 2

## 2012-01-10 MED ORDER — METOCLOPRAMIDE HCL 5 MG/ML IJ SOLN
5.0000 mg | Freq: Three times a day (TID) | INTRAMUSCULAR | Status: DC | PRN
  Filled 2012-01-10: qty 2

## 2012-01-10 MED ORDER — ONDANSETRON HCL 4 MG/2ML IJ SOLN
INTRAMUSCULAR | Status: DC | PRN
  Administered 2012-01-10 (×2): 4 mg via INTRAVENOUS

## 2012-01-10 MED ORDER — MIDAZOLAM HCL 2 MG/2ML IJ SOLN
0.5000 mg | INTRAMUSCULAR | Status: DC | PRN
  Administered 2012-01-10: 2 mg via INTRAVENOUS

## 2012-01-10 MED ORDER — PHENOL 1.4 % MT LIQD
1.0000 | OROMUCOSAL | Status: DC | PRN
  Filled 2012-01-10: qty 177

## 2012-01-10 MED ORDER — FERROUS SULFATE 325 (65 FE) MG PO TABS
325.0000 mg | ORAL_TABLET | Freq: Three times a day (TID) | ORAL | Status: DC
  Administered 2012-01-11 – 2012-01-14 (×10): 325 mg via ORAL
  Filled 2012-01-10 (×14): qty 1

## 2012-01-10 MED ORDER — SIMVASTATIN 20 MG PO TABS
20.0000 mg | ORAL_TABLET | Freq: Every day | ORAL | Status: DC
  Administered 2012-01-10 – 2012-01-13 (×4): 20 mg via ORAL
  Filled 2012-01-10 (×5): qty 1

## 2012-01-10 MED ORDER — HYDROMORPHONE HCL PF 1 MG/ML IJ SOLN
0.5000 mg | INTRAMUSCULAR | Status: DC | PRN
  Administered 2012-01-11 – 2012-01-14 (×4): 1 mg via INTRAVENOUS
  Filled 2012-01-10 (×4): qty 1

## 2012-01-10 MED ORDER — ONDANSETRON HCL 4 MG PO TABS: 4.0000 mg | ORAL_TABLET | Freq: Four times a day (QID) | ORAL | Status: DC | PRN

## 2012-01-10 MED ORDER — ACETAMINOPHEN 325 MG PO TABS
650.0000 mg | ORAL_TABLET | Freq: Four times a day (QID) | ORAL | Status: DC | PRN
  Filled 2012-01-10: qty 2
  Filled 2012-01-10: qty 1

## 2012-01-10 SURGICAL SUPPLY — 58 items
BANDAGE ELASTIC 4 VELCRO ST LF (GAUZE/BANDAGES/DRESSINGS) ×2 IMPLANT
BANDAGE ELASTIC 6 VELCRO ST LF (GAUZE/BANDAGES/DRESSINGS) ×2 IMPLANT
BANDAGE ESMARK 6X9 LF (GAUZE/BANDAGES/DRESSINGS) ×1 IMPLANT
BANDAGE GAUZE ELAST BULKY 4 IN (GAUZE/BANDAGES/DRESSINGS) ×2 IMPLANT
BLADE SAG 18X100X1.27 (BLADE) ×2 IMPLANT
BLADE SAW SGTL 13.0X1.19X90.0M (BLADE) ×2 IMPLANT
BNDG ELASTIC 6X10 VLCR STRL LF (GAUZE/BANDAGES/DRESSINGS) ×2 IMPLANT
BNDG ESMARK 6X9 LF (GAUZE/BANDAGES/DRESSINGS) ×2
BOWL SMART MIX CTS (DISPOSABLE) ×2 IMPLANT
CEMENT HV SMART SET (Cement) ×4 IMPLANT
CLOSURE STERI STRIP 1/2 X4 (GAUZE/BANDAGES/DRESSINGS) ×2 IMPLANT
CLOTH BEACON ORANGE TIMEOUT ST (SAFETY) ×2 IMPLANT
COVER BACK TABLE 24X17X13 BIG (DRAPES) IMPLANT
COVER SURGICAL LIGHT HANDLE (MISCELLANEOUS) ×2 IMPLANT
CUFF TOURNIQUET SINGLE 34IN LL (TOURNIQUET CUFF) ×2 IMPLANT
CUFF TOURNIQUET SINGLE 44IN (TOURNIQUET CUFF) IMPLANT
DRAPE EXTREMITY T 121X128X90 (DRAPE) ×2 IMPLANT
DRAPE PROXIMA HALF (DRAPES) ×2 IMPLANT
DRAPE U-SHAPE 47X51 STRL (DRAPES) ×2 IMPLANT
DRSG ADAPTIC 3X8 NADH LF (GAUZE/BANDAGES/DRESSINGS) ×2 IMPLANT
DRSG PAD ABDOMINAL 8X10 ST (GAUZE/BANDAGES/DRESSINGS) ×4 IMPLANT
DURAPREP 26ML APPLICATOR (WOUND CARE) ×2 IMPLANT
ELECT CAUTERY BLADE 6.4 (BLADE) ×2 IMPLANT
ELECT REM PT RETURN 9FT ADLT (ELECTROSURGICAL) ×2
ELECTRODE REM PT RTRN 9FT ADLT (ELECTROSURGICAL) ×1 IMPLANT
FACESHIELD LNG OPTICON STERILE (SAFETY) ×2 IMPLANT
GLOVE BIOGEL PI ORTHO PRO 7.5 (GLOVE) ×1
GLOVE BIOGEL PI ORTHO PRO SZ8 (GLOVE) ×1
GLOVE ORTHO TXT STRL SZ7.5 (GLOVE) ×2 IMPLANT
GLOVE PI ORTHO PRO STRL 7.5 (GLOVE) ×1 IMPLANT
GLOVE PI ORTHO PRO STRL SZ8 (GLOVE) ×1 IMPLANT
GLOVE SURG ORTHO 8.5 STRL (GLOVE) ×2 IMPLANT
GOWN STRL NON-REIN LRG LVL3 (GOWN DISPOSABLE) ×2 IMPLANT
GOWN STRL REIN XL XLG (GOWN DISPOSABLE) ×8 IMPLANT
HANDPIECE INTERPULSE COAX TIP (DISPOSABLE) ×1
IMMOBILIZER KNEE 22 UNIV (SOFTGOODS) IMPLANT
KIT BASIN OR (CUSTOM PROCEDURE TRAY) ×2 IMPLANT
KIT MANIFOLD (MISCELLANEOUS) ×2 IMPLANT
KIT ROOM TURNOVER OR (KITS) ×2 IMPLANT
MANIFOLD NEPTUNE II (INSTRUMENTS) ×2 IMPLANT
NS IRRIG 1000ML POUR BTL (IV SOLUTION) ×2 IMPLANT
PACK TOTAL JOINT (CUSTOM PROCEDURE TRAY) ×2 IMPLANT
PAD ARMBOARD 7.5X6 YLW CONV (MISCELLANEOUS) ×4 IMPLANT
SET HNDPC FAN SPRY TIP SCT (DISPOSABLE) ×1 IMPLANT
SPONGE GAUZE 4X4 12PLY (GAUZE/BANDAGES/DRESSINGS) ×2 IMPLANT
STRIP CLOSURE SKIN 1/2X4 (GAUZE/BANDAGES/DRESSINGS) ×4 IMPLANT
SUCTION FRAZIER TIP 10 FR DISP (SUCTIONS) ×2 IMPLANT
SUT MNCRL AB 3-0 PS2 18 (SUTURE) ×2 IMPLANT
SUT VIC AB 0 CT1 27 (SUTURE) ×2
SUT VIC AB 0 CT1 27XBRD ANBCTR (SUTURE) ×2 IMPLANT
SUT VIC AB 1 CT1 27 (SUTURE) ×2
SUT VIC AB 1 CT1 27XBRD ANBCTR (SUTURE) ×2 IMPLANT
SUT VIC AB 2-0 CT1 27 (SUTURE) ×2
SUT VIC AB 2-0 CT1 TAPERPNT 27 (SUTURE) ×2 IMPLANT
TOWEL OR 17X24 6PK STRL BLUE (TOWEL DISPOSABLE) ×2 IMPLANT
TOWEL OR 17X26 10 PK STRL BLUE (TOWEL DISPOSABLE) ×2 IMPLANT
TRAY FOLEY CATH 14FR (SET/KITS/TRAYS/PACK) ×2 IMPLANT
WATER STERILE IRR 1000ML POUR (IV SOLUTION) ×8 IMPLANT

## 2012-01-10 NOTE — Anesthesia Procedure Notes (Addendum)
Performed by: Constance Goltz    Anesthesia Regional Block:  Femoral nerve block  Pre-Anesthetic Checklist: ,, timeout performed, Correct Patient, Correct Site, Correct Laterality, Correct Procedure, Correct Position, site marked, Risks and benefits discussed,  Surgical consent,  Pre-op evaluation,  At surgeon's request and post-op pain management  Laterality: Left  Prep: chloraprep       Needles:   Needle Type: Other   (Arrow Echogenic)   Needle Length: 9cm  Needle Gauge: 21    Additional Needles:  Procedures: ultrasound guided Femoral nerve block Narrative:  Start time: 01/10/2012 9:18 AM End time: 01/10/2012 9:26 AM Injection made incrementally with aspirations every 5 mL.  Performed by: Personally  Anesthesiologist: C. Frederick MD  Additional Notes: Ultrasound guidance used to: id relevant anatomy, confirm needle position, local anesthetic spread, avoidance of vascular puncture. Picture saved. No complications. Block performed personally by Janetta Hora. Gelene Mink, MD    Femoral nerve block Procedure Name: Intubation Date/Time: 01/10/2012 10:19 AM Performed by: Fuller Canada Pre-anesthesia Checklist: Patient identified, Timeout performed, Emergency Drugs available, Suction available and Patient being monitored Patient Re-evaluated:Patient Re-evaluated prior to inductionOxygen Delivery Method: Circle System Utilized Preoxygenation: Pre-oxygenation with 100% oxygen Intubation Type: IV induction Ventilation: Mask ventilation without difficulty Laryngoscope Size: Mac and 3 Grade View: Grade II Tube type: Oral Tube size: 7.5 mm Number of attempts: 1 Airway Equipment and Method: stylet Placement Confirmation: ETT inserted through vocal cords under direct vision,  breath sounds checked- equal and bilateral and positive ETCO2 Secured at: 23 cm Tube secured with: Tape Dental Injury: Teeth and Oropharynx as per pre-operative assessment

## 2012-01-10 NOTE — Interval H&P Note (Signed)
History and Physical Interval Note:  01/10/2012 9:12 AM  James [name]  has presented today for surgery, with the diagnosis of LEFT KNEE OA  The various methods of treatment have been discussed with the patient and family. After consideration of risks, benefits and other options for treatment, the patient has consented to  Procedure(s): TOTAL KNEE ARTHROPLASTY as a surgical intervention .  The patients' history has been reviewed, patient examined, no change in status, stable for surgery.  I have reviewed the patients' chart and labs.  Questions were answered to the patient's satisfaction.     Euline Kimbler,STEVEN R

## 2012-01-10 NOTE — Anesthesia Preprocedure Evaluation (Addendum)
Anesthesia Evaluation  Patient identified by MRN, date of birth, ID band Patient awake    Reviewed: Allergy & Precautions  History of Anesthesia Complications Negative for: history of anesthetic complications  Airway Mallampati: I TM Distance: >3 FB Neck ROM: Full    Dental  (+) Teeth Intact   Pulmonary sleep apnea and Continuous Positive Airway Pressure Ventilation , pneumonia ,          Cardiovascular hypertension, On Medications + CAD Regular Normal    Neuro/Psych  Neuromuscular disease    GI/Hepatic hiatal hernia, GERD-  ,  Endo/Other  Morbid obesity  Renal/GU      Musculoskeletal   Abdominal   Peds  Hematology   Anesthesia Other Findings See surgeon's H&P   Reproductive/Obstetrics                         Anesthesia Physical Anesthesia Plan  ASA: III  Anesthesia Plan: General   Post-op Pain Management: MAC Combined w/ Regional for Post-op pain   Induction: Intravenous  Airway Management Planned: Oral ETT  Additional Equipment:   Intra-op Plan:   Post-operative Plan: Extubation in OR  Informed Consent:   Plan Discussed with:   Anesthesia Plan Comments:         Anesthesia Quick Evaluation

## 2012-01-10 NOTE — Progress Notes (Addendum)
ANTICOAGULATION CONSULT NOTE - Initial Consult  Pharmacy Consult for coumadin Indication: VTE prophylaxis  Allergies  Allergen Reactions  . Penicillins     REACTION: Rash, swelling  . Rofecoxib   . Sulfamethoxazole W/Trimethoprim     REACTION: questional onset of low platelets and bleeding  . Tramadol Hcl     REACTION: dizziness, drowsiness  . Neosporin (Neomycin-Polymyx-Gramicid) Rash    Vital Signs: Temp: 98.7 F (37.1 C) (01/18 1500) Temp src: Oral (01/18 0837) BP: 126/61 mmHg (01/18 1330) Pulse Rate: 73  (01/18 1330)  Labs:  Baseline labs 12/31/11: H/H:  14.6/44.5 Platelets:  217k  Baseline INR 1/18:  0.95  No results found for this basename: HGB:2,HCT:3,PLT:3,APTT:3,LABPROT:3,INR:3,HEPARINUNFRC:3,CREATININE:3,CKTOTAL:3,CKMB:3,TROPONINI:3 in the last 72 hours The CrCl is unknown because both a height and weight (above a minimum accepted value) are required for this calculation.  Medical History: Past Medical History  Diagnosis Date  . Bradycardia     Hypertensive  . carotid bruit, right   . hypercholesterolemia     Trig 234/293;takes Simcor daily  . Obesity, morbid   . Vasculitis   . Immune thrombocytopenic purpura     Dr. Cyndie Chime  . Dermatitis     legs  . Blood in stool   . Bleeding gums   . Chronic low back pain   . Myalgia and myositis   . Unspecified vitamin D deficiency   . Bladder diverticulum   . Primary osteoarthritis of both knees   . Anxiety   . Nephrolithiasis   . History of elevated glucose   . Thrombophlebitis     right forearm  . History of BPH   . History of colonic polyps     James [name]  . Epididymitis, left     S/P Epidimectomy  . Pneumonia, bacterial     RML resolved, spirometry, normal  . History of ITP 2011    per Dr. Cyndie Chime 2011, normal platelets  . Thrombocytopenia 05/20-05/27/10    Hosp ITP severe, Dr. Cyndie Chime  . Splenomegaly 05/15/09    abd U/S NML  . Hypertension, benign     takes Micardis daily  .  Sleep apnea     sitting/lying/exertion  . Neuropathy     acute  . Arthritis     hands  . Bruises easily   . H/O hiatal hernia   . GERD (gastroesophageal reflux disease)     hx of but doesn't take any meds now  . Urinary frequency   . Urinary urgency   . Nocturia   . Urinary leakage   . Malignant melanoma of ear     right  . Testicular cancer     Medications:  Prescriptions prior to admission  Medication Sig Dispense Refill  . amoxicillin (AMOXIL) 500 MG capsule Take 500 mg by mouth 3 (three) times daily. Stop date: 07/03/12       . aspirin 81 MG tablet Take 81 mg by mouth every evening.       . Cholecalciferol (VITAMIN D3) 2000 UNITS capsule Take 2,000 Units by mouth daily.        . furosemide (LASIX) 40 MG tablet Take 80 mg by mouth daily.        Marland Kitchen guaifenesin (HUMIBID E) 400 MG TABS Take 400 mg by mouth every 4 (four) hours. For cough & congestion       . metroNIDAZOLE (FLAGYL) 250 MG tablet Take 250 mg by mouth 3 (three) times daily. Stop date: 01/06/12       . Misc Natural  Products (OSTEO BI-FLEX JOINT SHIELD PO) Take 2 tablets by mouth 2 (two) times daily.      . Multiple Vitamin (MULTIVITAMIN) tablet Take 1 tablet by mouth daily.        . niacin-simvastatin (SIMCOR) 1000-20 MG 24 hr tablet Take 1 tablet by mouth at bedtime.        . nitroGLYCERIN (NITROSTAT) 0.4 MG SL tablet Place 0.4 mg under the tongue every 5 (five) minutes as needed. For chest pain      . PSYLLIUM PO Take 20 mLs by mouth every evening. Powder. 4 teaspoonfuls every evening       . telmisartan (MICARDIS) 20 MG tablet Take 1 tablet (20 mg total) by mouth daily.  90 tablet  3  . meclizine (ANTIVERT) 25 MG tablet Take 1 tablet (25 mg total) by mouth 3 (three) times daily as needed for dizziness or nausea.  30 tablet  1    Assessment:  78 yr old male s/p L TKA on coumadin for VTE prophylaxis.  Baseline H/H and platelets wnl.  Baseline INR 0.95.  Since patient is greater than 35 yr old he is considered a  high risk patient and will start coumadin out at a low dose.        Goal of Therapy:  INR 2-3   Plan:  1.Coumadin 2.5 mg po x1 today 2.F/u daily INR in am 3.Coumadin book and video ordered   James [name] 01/10/2012,3:51 PM

## 2012-01-10 NOTE — Brief Op Note (Signed)
01/10/2012  12:16 PM  PATIENT:  James [name]  78 y.o. male  PRE-OPERATIVE DIAGNOSIS:  LEFT KNEE OA  POST-OPERATIVE DIAGNOSIS:  left  knee OA  PROCEDURE:  Procedure(s): TOTAL KNEE ARTHROPLASTY  SURGEON:  Surgeon(s): Verlee Rossetti, MD  PHYSICIAN ASSISTANT:   ASSISTANTS: Thea Gist, PA-C   ANESTHESIA:   general  EBL:  Total I/O In: 1000 [I.V.:1000] Out: -   BLOOD ADMINISTERED:none  DRAINS: none   LOCAL MEDICATIONS USED:  NONE  SPECIMEN:  No Specimen  DISPOSITION OF SPECIMEN:  N/A  COUNTS:  YES  TOURNIQUET:  * Missing tourniquet times found for documented tourniquets in log:  15277 *  DICTATION: .Other Dictation: Dictation Number [id number]  PLAN OF CARE: Admit to inpatient   PATIENT DISPOSITION:  PACU - hemodynamically stable.   Delay start of Pharmacological VTE agent (>24hrs) due to surgical blood loss or risk of bleeding:  {YES/NO/NOT APPLICABLE:20182

## 2012-01-10 NOTE — Transfer of Care (Signed)
Immediate Anesthesia Transfer of Care Note  Patient: James [name]  Procedure(s) Performed:  TOTAL KNEE ARTHROPLASTY  Patient Location: PACU  Anesthesia Type: General  Level of Consciousness: awake, alert , oriented and patient cooperative  Airway & Oxygen Therapy: Patient Spontanous Breathing and Patient connected to face mask oxygen  Post-op Assessment: Report given to PACU RN and Post -op Vital signs reviewed and stable  Post vital signs: Reviewed and stable Filed Vitals:   01/10/12 0940  BP:   Pulse: 60  Temp:   Resp: 16    Complications: No apparent anesthesia complications

## 2012-01-10 NOTE — Progress Notes (Signed)
Orthopedic Tech Progress Note Patient Details:  James [name] [birth year] [id number]  CPM Left Knee CPM Left Knee: On Left Knee Flexion (Degrees): 60  Left Knee Extension (Degrees): 0  Additional Comments: trapeze bar       Cammer, Mickie Bail 01/10/2012, 1:54 PM

## 2012-01-11 LAB — BASIC METABOLIC PANEL
BUN: 11 mg/dL (ref 6–23)
CO2: 27 mEq/L (ref 19–32)
Calcium: 8 mg/dL — ABNORMAL LOW (ref 8.4–10.5)
Creatinine, Ser: 0.99 mg/dL (ref 0.50–1.35)
GFR calc non Af Amer: 75 mL/min — ABNORMAL LOW (ref >90)
Glucose, Bld: 120 mg/dL — ABNORMAL HIGH (ref 70–99)

## 2012-01-11 LAB — URINE CULTURE: Culture  Setup Time: 201301191729

## 2012-01-11 LAB — CBC
HCT: 34.6 % — ABNORMAL LOW (ref 39.0–52.0)
Hemoglobin: 11.2 g/dL — ABNORMAL LOW (ref 13.0–17.0)
MCH: 29.9 pg (ref 26.0–34.0)
MCHC: 32.4 g/dL (ref 30.0–36.0)
MCV: 92.5 fL (ref 78.0–100.0)
RBC: 3.74 MIL/uL — ABNORMAL LOW (ref 4.22–5.81)

## 2012-01-11 LAB — PROTIME-INR: Prothrombin Time: 14.5 seconds (ref 11.6–15.2)

## 2012-01-11 MED ORDER — CIPROFLOXACIN HCL 250 MG PO TABS
250.0000 mg | ORAL_TABLET | Freq: Two times a day (BID) | ORAL | Status: DC
  Administered 2012-01-11 – 2012-01-14 (×6): 250 mg via ORAL
  Filled 2012-01-11 (×8): qty 1

## 2012-01-11 MED ORDER — WARFARIN SODIUM 2.5 MG PO TABS
2.5000 mg | ORAL_TABLET | Freq: Once | ORAL | Status: AC
  Administered 2012-01-11: 2.5 mg via ORAL
  Filled 2012-01-11: qty 1

## 2012-01-11 MED ORDER — CIPROFLOXACIN HCL 250 MG PO TABS
250.0000 mg | ORAL_TABLET | ORAL | Status: AC
  Administered 2012-01-11: 250 mg via ORAL
  Filled 2012-01-11: qty 1

## 2012-01-11 MED ORDER — VITAMIN D3 25 MCG (1000 UNIT) PO TABS
2000.0000 [IU] | ORAL_TABLET | Freq: Every day | ORAL | Status: DC
  Administered 2012-01-11 – 2012-01-14 (×4): 2000 [IU] via ORAL
  Filled 2012-01-11 (×4): qty 2

## 2012-01-11 NOTE — Progress Notes (Signed)
Physical Therapy Evaluation Patient Details Name: James [name] MRN: [id number] DOB: [birth year] Today's Date: 01/11/2012  Problem List:  Patient Active Problem List  Diagnoses  . DEFICIENCY, VITAMIN D NOS  . OBESITY, MORBID  . ANXIETY  . HYPERTENSION, BENIGN  . CAD, NATIVE VESSEL  . VASCULITIS  . OTHER DISEASE OF PHARYNX OR NASOPHARYNX  . BLEEDING GUMS  . HEMOCCULT POSITIVE STOOL  . NEPHROLITHIASIS  . BLADDER DIVERTICULUM  . SCAR CONDITION AND FIBROSIS OF SKIN  . OSTEOARTHRITIS, KNEES, BILATERAL  . LOW BACK PAIN, CHRONIC  . MYALGIA/MYOSITIS NOS  . EDEMA  . CAROTID BRUIT, RIGHT  . DYSPNEA ON EXERTION  . GLUCOSE INTOLERANCE, HX OF  . BENIGN PROSTATIC HYPERTROPHY, HX OF  . Vertigo  . Thrombocytopenia  . Melanoma    Past Medical History:  Past Medical History  Diagnosis Date  . Bradycardia     Hypertensive  . carotid bruit, right   . hypercholesterolemia     Trig 234/293;takes Simcor daily  . Obesity, morbid   . Vasculitis   . Immune thrombocytopenic purpura     Dr. Cyndie Chime  . Dermatitis     legs  . Blood in stool   . Bleeding gums   . Chronic low back pain   . Myalgia and myositis   . Unspecified vitamin D deficiency   . Bladder diverticulum   . Primary osteoarthritis of both knees   . Anxiety   . Nephrolithiasis   . History of elevated glucose   . Thrombophlebitis     right forearm  . History of BPH   . History of colonic polyps     James [name]  . Epididymitis, left     S/P Epidimectomy  . Pneumonia, bacterial     RML resolved, spirometry, normal  . History of ITP 2011    per Dr. Cyndie Chime 2011, normal platelets  . Thrombocytopenia 05/20-05/27/10    Hosp ITP severe, Dr. Cyndie Chime  . Splenomegaly 05/15/09    abd U/S NML  . Hypertension, benign     takes Micardis daily  . Sleep apnea     sitting/lying/exertion  . Neuropathy     acute  . Arthritis     hands  . Bruises easily   . H/O hiatal hernia   . GERD (gastroesophageal reflux  disease)     hx of but doesn't take any meds now  . Urinary frequency   . Urinary urgency   . Nocturia   . Urinary leakage   . Malignant melanoma of ear     right  . Testicular cancer    Past Surgical History:  Past Surgical History  Procedure Date  . Kidney stone retrieval 1950's  . Fem cutaneous nerve entrapment 1977    due to surgery (mild lat anesth of bilat legs)  . Herniorraphy     x 2 right  . Limited pelvis/hip bone scan 04/99    negative  . Bone scan 04/00  . Cataract extraction 02/04    right  . Cataract extraction 02/04    righ  . Wide local excision, right ear 06/04    melanoma with flap  . Stress cardiolite 05/11/04    mild inf. ischemia, EF 71%  . Left shoulder surgery 06/17/2005  . Bronchoscopy 09/08    RML collapse with chronic pneumonia, bx neg (Dr. Delford Field)  . Right middle finger cyst excision 03/07/09    DIP Joint Mucoid (Dr. Merlyn Lot)  . Eyelid & eyebrow lift 1996  . Onion  peele, left testical   . Right middle finger cyst reexcision 08/15/09    DIP Joint Mucoid Cyst (Dr. Merlyn Lot)  . Appendectomy 1950  . Hernia repair 1959    x 2  . Excision of mucoid tumor   . Cardiac catheterization 05/23/04    mild plague-statin  . Colonoscopy   . Testicular cancer removal     PT Assessment/Plan/Recommendation PT Assessment Clinical Impression Statement: Pt presents with a medical diagnosis of left TKA along with the following impairments/deficits and therapy diagnosis listed below. Pt will benefit from skilled PT in the acute care setting in order to maximize functional mobility for a safe d/c home PT Recommendation/Assessment: Patient will need skilled PT in the acute care venue PT Problem List: Decreased strength;Decreased range of motion;Decreased activity tolerance;Decreased mobility;Decreased knowledge of precautions;Pain;Decreased knowledge of use of DME PT Therapy Diagnosis : Abnormality of gait;Acute pain PT Plan PT Frequency: 7X/week PT  Treatment/Interventions: DME instruction;Gait training;Functional mobility training;Therapeutic activities;Therapeutic exercise;Patient/family education PT Recommendation Follow Up Recommendations: Home health PT;Supervision/Assistance - 24 hour Equipment Recommended: None recommended by PT PT Goals  Acute Rehab PT Goals PT Goal Formulation: With patient Time For Goal Achievement: 7 days Pt will go Supine/Side to Sit: with modified independence PT Goal: Supine/Side to Sit - Progress: Goal set today Pt will go Sit to Supine/Side: with modified independence PT Goal: Sit to Supine/Side - Progress: Goal set today Pt will go Sit to Stand: with modified independence PT Goal: Sit to Stand - Progress: Goal set today Pt will go Stand to Sit: with modified independence PT Goal: Stand to Sit - Progress: Goal set today Pt will Transfer Bed to Chair/Chair to Bed: with supervision PT Transfer Goal: Bed to Chair/Chair to Bed - Progress: Goal set today Pt will Ambulate: >150 feet;with supervision;with rolling walker PT Goal: Ambulate - Progress: Goal set today Pt will Perform Home Exercise Program: Independently PT Goal: Perform Home Exercise Program - Progress: Goal set today  PT Evaluation Precautions/Restrictions  Precautions Required Braces or Orthoses: Yes Knee Immobilizer: On except when in CPM Restrictions Weight Bearing Restrictions: Yes RLE Weight Bearing: Weight bearing as tolerated Prior Functioning  Home Living Lives With: Spouse;Daughter Receives Help From: Family Type of Home: House Home Layout: Two level;Able to live on main level with bedroom/bathroom Alternate Level Stairs-Rails: Can reach both Alternate Level Stairs-Number of Steps: 12 Home Access: Level entry Bathroom Shower/Tub: Health visitor: Handicapped height Bathroom Accessibility: Yes How Accessible: Accessible via walker Home Adaptive Equipment: Grab bars around toilet;Grab bars in shower;Walker  - rolling;Straight cane;Crutches Prior Function Level of Independence: Independent with basic ADLs;Independent with gait;Independent with transfers Able to Take Stairs?: Yes Driving: Yes Vocation: Retired Producer, television/film/video: Awake/alert Overall Cognitive Status: Appears within functional limits for tasks assessed Orientation Level: Oriented X4 Sensation/Coordination Sensation Light Touch: Appears Intact Extremity Assessment LUE Assessment LUE Assessment: Within Functional Limits RLE Assessment RLE Assessment: Within Functional Limits LLE Assessment LLE Assessment: Exceptions to WFL LLE AROM (degrees) Overall AROM Left Lower Extremity: Deficits;Due to pain LLE Overall AROM Comments: Hip and Ankle WFL; Knee 0-60 degrees LLE Strength LLE Overall Strength: Deficits;Due to pain LLE Overall Strength Comments: Hip and Ankle WFL; Pt with strong quad contraction Mobility (including Balance) Bed Mobility Bed Mobility: Yes Supine to Sit: 5: Supervision;HOB elevated (Comment degrees);With rails (HOB 30 degrees) Supine to Sit Details (indicate cue type and reason): VC for hand placement and sequencing. Pt able to complete without physical assist although posterior lean once in sitting position  without feet supported requiring assist to maintain balance in sitting Sitting - Scoot to Edge of Bed: 5: Supervision Sitting - Scoot to Edge of Bed Details (indicate cue type and reason): VC for hand placement and weight shifting Transfers Transfers: Yes Sit to Stand: 4: Min assist Sit to Stand Details (indicate cue type and reason): VC for hand placement and for forward translation to ease standing Stand to Sit: 4: Min assist Stand to Sit Details: VC for hand placement. Pt unable to control descent  Ambulation/Gait Ambulation/Gait: Yes Ambulation/Gait Assistance: 4: Min assist Ambulation/Gait Assistance Details (indicate cue type and reason): VC for sequencing and distance to  RW for safety.  Ambulation Distance (Feet): 30 Feet Assistive device: Rolling walker Gait Pattern: Step-to pattern;Decreased step length - right;Decreased stance time - left;Decreased hip/knee flexion - left;Trunk flexed Gait velocity: Decreased gait speed Stairs: No    Exercise  Total Joint Exercises Ankle Circles/Pumps: AROM;Strengthening;Both;10 reps;Supine Quad Sets: AROM;Strengthening;Left;10 reps;Supine Straight Leg Raises: AAROM;Strengthening;Left;10 reps;Supine End of Session PT - End of Session Equipment Utilized During Treatment: Gait belt Activity Tolerance: Patient tolerated treatment well Patient left: in chair;with call bell in reach Nurse Communication: Mobility status for transfers;Mobility status for ambulation General Behavior During Session: [facility] for tasks performed Cognition: [facility] for tasks performed  [name] 01/11/2012, 1:39 PM  01/11/2012 [name] DPT PAGER: [phone] OFFICE: [phone]

## 2012-01-11 NOTE — Progress Notes (Signed)
Subjective: 1 Day Post-Op Procedure(s) (LRB): TOTAL KNEE ARTHROPLASTY (Left) Patient reports pain as 5 on 0-10 scale.   Seen by Shon Baton in rounds. Objective: Vital signs in last 24 hours: Temp:  [97 F (36.1 C)-99.5 F (37.5 C)] 97.5 F (36.4 C) (01/19 0551) Pulse Rate:  [63-80] 63  (01/19 0551) Resp:  [11-18] 18  (01/19 0551) BP: (98-126)/(56-83) 106/56 mmHg (01/19 0551) SpO2:  [95 %-100 %] 98 % (01/19 0551)  Intake/Output from previous day: 01/18 0701 - 01/19 0700 In: 2343.8 [I.V.:2343.8] Out: 1750 [Urine:1750] Intake/Output this shift:     Basename 01/11/12 0600  HGB 11.2*    Basename 01/11/12 0600  WBC 10.6*  RBC 3.74*  HCT 34.6*  PLT 176    Basename 01/11/12 0600  NA 136  K 4.1  CL 101  CO2 27  BUN 11  CREATININE 0.99  GLUCOSE 120*  CALCIUM 8.0*    Basename 01/11/12 0600 01/10/12 1526  LABPT -- --  INR 1.11 0.95    Neurologically intact Neurovascular intact Sensation intact distally Dorsiflexion/Plantar flexion intact Incision: dressing C/D/I Compartment soft  Assessment/Plan: 1 Day Post-Op Procedure(s) (LRB): TOTAL KNEE ARTHROPLASTY (Left) Advance diet Up with therapy KVO IV when taking pos well Wean from PCA today d/c later PT Coumadin per pharm Dressing change tomorrow  Blakeleigh Domek R. 01/11/2012, 9:51 AM

## 2012-01-11 NOTE — Progress Notes (Signed)
Spoke to Dr. Victorino Dike about patient taking coumadin with blood in his urine, Dr. Victorino Dike stated to go ahead and give the medication.

## 2012-01-11 NOTE — Progress Notes (Signed)
Leny Morozov Fedorchak 78 y.o. 01/10/2012  left  knee OA  1 Day Post-Op   Subjective doing well without problems  Objective decreased ROM right knee grossly normal peripheral pulses symmetrical abdomen is soft without significant tenderness, masses, organomegaly or guarding dressing C/D/I  Lab. Results: Basename   01/11/12             0600      WBC        10.6*     HGB        11.2*     HCT        34.6*     PLT        176       BMET Basename   01/11/12             0600      NA         136       K          4.1       CL         101       CO2        27        GLUCOSE    120*      BUN        11        CREATININE 0.99      CALCIUM    8.0*       INR (no units)  Date                       Value       Range   Status  01/11/2012                   1.11    Final ---------- VITALS ---------------------------              01/11/12                     0551        ---------------------------  BP:          106/56        Pulse:         63          Temp:   97.5 F (36.4 C)  Resp:          18         ---------------------------  Dg Chest 2 View  12/31/2011  *RADIOLOGY REPORT*  Clinical Data: Preop  CHEST - 2 VIEW  Comparison: 10/11/2008  Findings: Cardiomediastinal silhouette is stable. No acute infiltrate or pleural effusion.  No pulmonary edema. Atherosclerotic calcifications of thoracic aorta again noted. Stable degenerative changes thoracic spine.  IMPRESSION: No active disease.  No significant change.  Original Report Authenticated By: Natasha Mead, M.D.   X-ray Knee Left Port  01/10/2012  *RADIOLOGY REPORT*  Clinical Data: Postop left knee replacement  PORTABLE LEFT KNEE - 1-2 VIEW  Comparison: MRI of the left knee of 04/05/2010  Findings: Two portable views of the left knee show the femoral and tibial components of the left total knee replacement to be in good position.  No complicating features are seen.  Some air is noted in the soft tissues postoperatively.   IMPRESSION: The components of the left total knee replacement are in good position and alignment on the portable views obtained.  Original Report Authenticated  By: Juline Patch, M.D.     Assessment/ Plan Doing well postoperatively. Patient doing well Advance per protocol Dressing change POD 2      Meliah Appleman D 1/19/20139:51 AM

## 2012-01-11 NOTE — Clinical Documentation Improvement (Signed)
I was called with continued concern over blood in patient's urine.  Patient was started on Cipro earlier as well as urine culture ordered.  Per nurse no traumatic events occurred with placement or removal of foley.  Due to continued concern I call urology and spoke with Dr Laverle Patter about the patients lab finding and bleeding.  He felt this was a very common occurrence due to prostatic bleeding that with placement of foley for at lease two days that the bleeding should resolved and voiding trial could be attempted.  I did discuss with him the families concern over the situation.   If there are continued issues we will call back to have urology see the patient but will first place foley and recommendations as per phone consult with Dr Laverle Patter.   I

## 2012-01-11 NOTE — Progress Notes (Addendum)
Patient's urine has been very dark red in color all day.  Pt started on Cipro per MD order for UTI.  Pt c/o of burning and pain upon urination in urinal.  Pt voiding small amounts at a time.  Pt's family has expressed concern.  Notified MD.  No new orders. Will continue to monitor.  Reassured family.  Urine cx pending.  Vitals signs stable.  HGB 11.2.

## 2012-01-11 NOTE — Progress Notes (Addendum)
ANTICOAGULATION CONSULT NOTE - Follow Up Consult  Pharmacy Consult for Coumadin Indication: VTE prophylaxis  Allergies  Allergen Reactions  . Penicillins     REACTION: Rash, swelling  . Rofecoxib   . Sulfamethoxazole W/Trimethoprim     REACTION: questional onset of low platelets and bleeding  . Tramadol Hcl     REACTION: dizziness, drowsiness  . Neosporin (Neomycin-Polymyx-Gramicid) Rash    Patient Measurements:    Vital Signs: Temp: 97.5 F (36.4 C) (01/19 0551) BP: 106/56 mmHg (01/19 0551) Pulse Rate: 63  (01/19 0551)  Labs:  Basename 01/11/12 0600 01/10/12 1526  HGB 11.2* --  HCT 34.6* --  PLT 176 --  APTT -- --  LABPROT 14.5 12.9  INR 1.11 0.95  HEPARINUNFRC -- --  CREATININE 0.99 --  CKTOTAL -- --  CKMB -- --  TROPONINI -- --   The CrCl is unknown because both a height and weight (above a minimum accepted value) are required for this calculation.   Medications:  Scheduled:    . aspirin EC  81 mg Oral QPM  . cholecalciferol  2,000 Units Oral Daily  . ferrous sulfate  325 mg Oral TID PC  . furosemide  80 mg Oral Daily  . guaiFENesin  400 mg Oral 6 X Daily  . multivitamin  1 tablet Oral Daily  . niacin  1,000 mg Oral QHS  . olmesartan  10 mg Oral Daily  . patient's guide to using coumadin book   Does not apply Once  . psyllium  1 packet Oral Daily  . simvastatin  20 mg Oral QHS  . vancomycin  1,000 mg Intravenous Q12H  . warfarin  2.5 mg Oral ONCE-1800  . warfarin  2.5 mg Oral ONCE-1800  . warfarin   Does not apply Once  . DISCONTD: aspirin  81 mg Oral QPM  . DISCONTD: chlorhexidine  60 mL Topical Once  . DISCONTD: guaifenesin  400 mg Oral Q4H  . DISCONTD: niacin-simvastatin  1 tablet Oral QHS  . DISCONTD: Osteo Bi-Flex Joint Shield  1 tablet Oral BID  . DISCONTD: Vitamin D3  2,000 Units Oral Daily    Assessment: Pt s/p elective L TKA on 01/10/12.  Started on warfarin 2.5mg  post-op. INR rising to goal. No bleeding noted.   Goal of Therapy:    INR 2-3   Plan:  1) Coumadin 2.5mg  po x 1 today 2) daily protime  Jalena Vanderlinden, [facility] 01/11/2012,12:11 PM   Addendum: Pt to start on Cipro for UTI. Urine cx pending. Pt afebrile. Renal function preserved. Cipro can interact with warfarin & elevate INR.  Monitoring INR daily.   1) Begin Cipro 250mg  po bid  Junita Push, PharmD, BCPS

## 2012-01-11 NOTE — Op Note (Signed)
NAME:  James [name], James [name]                 ACCOUNT NO.:  [id number]  MEDICAL RECORD NO.:  [id number]  LOCATION:  5019                         FACILITY:  [facility]  PHYSICIAN:  Almedia Balls. Ranell Patrick, M.D. DATE OF BIRTH:  05/05/1928  DATE OF PROCEDURE:  01/10/2012 DATE OF DISCHARGE:                              OPERATIVE REPORT   PREOPERATIVE DIAGNOSIS:  Left knee end-stage osteoarthritis.  POSTOPERATIVE DIAGNOSIS:  Left knee end-stage osteoarthritis.  PROCEDURE PERFORMED:  Left total knee replacement using DePuy Sigma rotating platform prosthesis.  ATTENDING SURGEON:  Almedia Balls. Ranell Patrick, M.D.  ASSISTANT:  Donnie Coffin. Durwin Nora, P.A.  He was scrubbed during the entire procedure and necessary for completion of procedure, adequate retraction.  ANESTHESIA:  General anesthesia was used plus femoral block.  ESTIMATED BLOOD LOSS:  Minimal.  FLUID REPLACEMENT:  1200 mL crystalloid.  COUNTS:  Correct.  COMPLICATIONS:  No complications.  Perioperative antibiotics were given.  INDICATIONS:  Patient is an 78 year old male with worsening left knee pain secondary to end-stage osteoarthritis.  The patient has advanced degenerative changes on his x-rays with bone-on-bone contact as well as inability to walk more than a block.  He uses a cane periodically for balance.  The patient has had numerous injections and modification to his activity, all without success for relief of pain.  He desires operative total knee arthroplasty.  Risks and benefits of surgery discussed.  Informed consent obtained.  DESCRIPTION OF PROCEDURE:  After adequate level of anesthesia was achieved, the patient positioned in supine position and left leg correctly identified.  Nonsterile tourniquet was placed on the proximal thigh.  Sterile prep and drape of the left knee was performed.  Time-out called.  Left knee was then exsanguinated using Esmarch bandage.  The tourniquet was elevated to 350 mmHg.  The knee was placed in flexion.   A longitudinal midline incision was created with a #10 blade scalpel, dissection down through subcutaneous tissues and bursa to the parapatellar tissues.  We performed a medial retinacular exposure using a fresh #10 blade scalpel into the joint, we divided the lateral patellofemoral ligaments and everted the patella.  We then placed a step- cut drill in the distal femur, irrigated the distal femur and suctioned out and then went ahead and introduced an intramedullary guide before resection of 10 mm off the end of the femur set on 5 degrees left.  We then sized the femur to a size 5 anterior down performing anterior, posterior, and chamfer cuts off performed 1 block.  We resected ACL, PCL, and meniscal tissues.  We then subluxed the tibia anteriorly and resected the top of the tibia 90 degrees perpendicular to the long axis of the tibia.  We had minimal posterior slope with posterior cruciate substituting M.B.T. revision tray.  Once we had done that, we used the lamina spreader to visualize the posterior aspect of the femur and removed excess bone using a half-inch osteotome.  We then released the posterior capsule off the back of the femur and checked our flexion- extension gaps, which were symmetric at 12.5 mm.  We removed our pins from the tibia.  We then went ahead and inspected the tibia, there seemed to  be a cystic area that was consistent with a lucency on x-ray. There was no evidence of pathological mass or fibrous tissue.  There was just simply some cystic fluid down in the just beyond the tibial tubercle.  There were no signs of any infection.  We went ahead and then finished our tibial preparation with the modular drill and keel punch. We went ahead and marked the center part of our tibial tray, then went ahead and performed our box cut for the femur for the 5 left femoral component.  We used the oscillating saw for that and placed our trial 5 left femur in place.  We then  reduced it with a 12.5 poly and we were happy with that soft tissue balancing and full extension.  We then resurfaced the patella going from 27 mm thickness down to 17 mm of thickness when I drilled our lugs for our size 42 patella.  Once that was in place, we took the knee through a full range of motion with normal patellar tracking, nice stability in flexion and extension.  We removed all trial components, pulse irrigated the femur and tibia and patella and then went ahead and dried all the bony surfaces and cemented the components in place with DePuy high viscosity cement and placed the knee in extension with a 12.5 insert.  Once the cement was hardened, we were happy with stability with the 12.5.  We selected real 12.5 and removed excess cement using quarter-inch curved osteotome.  We then went ahead and inserted that 12.5 poly insert and reduced the knee and we were again happy with the balance and patellar tracking.  The medial parapatellar arthrotomy was closed after thorough pulse irrigation of the knee.  It was closed with interrupted #1 Vicryl suture, followed by 0 and 2-0 layered Vicryl for subcutaneous closure and 4-0 Monocryl for skin.  Steri-Strips applied, followed by sterile dressing.  The patient tolerated the surgery well.     Almedia Balls. Ranell Patrick, M.D.     SRN/MEDQ  D:  01/10/2012  T:  01/11/2012  Job:  [id number]

## 2012-01-12 LAB — CBC
HCT: 31.5 % — ABNORMAL LOW (ref 39.0–52.0)
Hemoglobin: 10.3 g/dL — ABNORMAL LOW (ref 13.0–17.0)
MCV: 93.8 fL (ref 78.0–100.0)
WBC: 9.4 10*3/uL (ref 4.0–10.5)

## 2012-01-12 LAB — PROTIME-INR: INR: 1.19 (ref 0.00–1.49)

## 2012-01-12 MED ORDER — WARFARIN SODIUM 2.5 MG PO TABS
2.5000 mg | ORAL_TABLET | Freq: Once | ORAL | Status: AC
  Administered 2012-01-12: 2.5 mg via ORAL
  Filled 2012-01-12: qty 1

## 2012-01-12 NOTE — Progress Notes (Signed)
ANTICOAGULATION CONSULT NOTE - Follow Up Consult  Pharmacy Consult for Coumadin Indication: VTE prophylaxis  Allergies  Allergen Reactions  . Penicillins     REACTION: Rash, swelling  . Rofecoxib   . Sulfamethoxazole W/Trimethoprim     REACTION: questional onset of low platelets and bleeding  . Tramadol Hcl     REACTION: dizziness, drowsiness  . Neosporin (Neomycin-Polymyx-Gramicid) Rash    Patient Measurements:    Vital Signs: Temp: 99.7 F (37.6 C) (01/20 0635) BP: 104/46 mmHg (01/20 0635) Pulse Rate: 79  (01/20 0635)  Labs:  Basename 01/12/12 0650 01/11/12 0600 01/10/12 1526  HGB 10.3* 11.2* --  HCT 31.5* 34.6* --  PLT 160 176 --  APTT -- -- --  LABPROT 15.4* 14.5 12.9  INR 1.19 1.11 0.95  HEPARINUNFRC -- -- --  CREATININE -- 0.99 --  CKTOTAL -- -- --  CKMB -- -- --  TROPONINI -- -- --   The CrCl is unknown because both a height and weight (above a minimum accepted value) are required for this calculation.   Medications:  Scheduled:     . aspirin EC  81 mg Oral QPM  . cholecalciferol  2,000 Units Oral Daily  . ciprofloxacin  250 mg Oral BID  . ciprofloxacin  250 mg Oral NOW  . ferrous sulfate  325 mg Oral TID PC  . furosemide  80 mg Oral Daily  . guaiFENesin  400 mg Oral 6 X Daily  . multivitamin  1 tablet Oral Daily  . niacin  1,000 mg Oral QHS  . olmesartan  10 mg Oral Daily  . patient's guide to using coumadin book   Does not apply Once  . psyllium  1 packet Oral Daily  . simvastatin  20 mg Oral QHS  . warfarin  2.5 mg Oral ONCE-1800  . warfarin   Does not apply Once    Assessment: Pt s/p elective L TKA on 01/10/12.  Started on warfarin 2.5mg  post-op. INR rising to goal slowly. Pt with hematuria/possible UTI for which he was started on Cipro yesterday.  Pt also has hx of ITP.  Given these reasons will allow INR to trend to goal slowly and continue with lowered dose of warfarin.  Goal of Therapy:  INR 2-3   Plan:  1) Coumadin 2.5mg  po x 1  today 2) daily protime  Asia Dusenbury, Mercy Riding 01/12/2012,10:34 AM

## 2012-01-12 NOTE — Progress Notes (Signed)
Physical Therapy Treatment Patient Details Name: James [name] MRN: [id number] DOB: [birth year] Today's Date: 01/12/2012  PT Assessment/Plan  PT - Assessment/Plan Comments on Treatment Session: Pt progressing, he was able to ambulate increased distance today. Pt still with LLE weakness during functional activity. Continue per plan. PT Plan: Discharge plan remains appropriate;Frequency remains appropriate PT Frequency: 7X/week Follow Up Recommendations: Home health PT;Supervision/Assistance - 24 hour Equipment Recommended: None recommended by PT PT Goals  Acute Rehab PT Goals PT Goal Formulation: With patient PT Goal: Supine/Side to Sit - Progress: Progressing toward goal PT Goal: Sit to Supine/Side - Progress: Progressing toward goal PT Goal: Sit to Stand - Progress: Progressing toward goal PT Goal: Stand to Sit - Progress: Progressing toward goal PT Transfer Goal: Bed to Chair/Chair to Bed - Progress: Progressing toward goal PT Goal: Ambulate - Progress: Progressing toward goal PT Goal: Perform Home Exercise Program - Progress: Progressing toward goal  PT Treatment Precautions/Restrictions  Precautions Required Braces or Orthoses: Yes Knee Immobilizer: On except when in CPM Restrictions Weight Bearing Restrictions: Yes RLE Weight Bearing: Weight bearing as tolerated LLE Weight Bearing: Weight bearing as tolerated Mobility (including Balance) Bed Mobility Bed Mobility: No Transfers Transfers: Yes Sit to Stand: 4: Min assist;5: Supervision;With upper extremity assist;With armrests;From chair/3-in-1 Sit to Stand Details (indicate cue type and reason): Min assist from chair. Supervision from raised toilet. VC for hand placement and LE placement. Min assist from chair for stability into standing and support Stand to Sit: 4: Min assist;With armrests;With upper extremity assist;To chair/3-in-1 Stand to Sit Details: VC for hand placement.  Pt unable to control  descent Ambulation/Gait Ambulation/Gait: Yes Ambulation/Gait Assistance: 4: Min assist;3: Mod assist (Mod assist at times with LLE weight bearing) Ambulation/Gait Assistance Details (indicate cue type and reason): VCs throughout for even step length. Pt with difficulty with weightbearing on LLE for step length. One loss of balance with min assist for stability during R step. VC throughout for safety with RW and technique Ambulation Distance (Feet): 50 Feet Assistive device: Rolling walker Gait Pattern: Step-to pattern;Decreased step length - right;Decreased stance time - left;Decreased hip/knee flexion - left;Trunk flexed;Decreased weight shift to left Gait velocity: Decreased gait speed Stairs: No    Exercise    End of Session PT - End of Session Equipment Utilized During Treatment: Gait belt Activity Tolerance: Patient tolerated treatment well Patient left: in chair;with call bell in reach Nurse Communication: Mobility status for transfers;Mobility status for ambulation General Behavior During Session: [facility] for tasks performed (Simultaneous filing. User may not have seen previous data.) Cognition: [facility] for tasks performed (Simultaneous filing. User may not have seen previous data.)  James [name] 01/12/2012, 10:58 AM  01/12/2012 James [name] DPT PAGER: [phone] OFFICE: [phone]

## 2012-01-12 NOTE — Progress Notes (Signed)
Physical Therapy Treatment Patient Details Name: James [name] MRN: [id number] DOB: [birth year] Today's Date: 01/12/2012  PT Assessment/Plan  PT - Assessment/Plan Comments on Treatment Session: Pt still progressing with ambulation distance. Pt still requires cueing throughout session for safety. Continue with gait sequencing. PT Plan: Discharge plan remains appropriate;Frequency remains appropriate PT Frequency: 7X/week Follow Up Recommendations: Home health PT;Supervision/Assistance - 24 hour Equipment Recommended: None recommended by OT PT Goals  Acute Rehab PT Goals PT Goal Formulation: With patient PT Goal: Supine/Side to Sit - Progress: Progressing toward goal PT Goal: Sit to Supine/Side - Progress: Progressing toward goal PT Goal: Sit to Stand - Progress: Progressing toward goal PT Goal: Stand to Sit - Progress: Progressing toward goal PT Transfer Goal: Bed to Chair/Chair to Bed - Progress: Progressing toward goal PT Goal: Ambulate - Progress: Progressing toward goal PT Goal: Perform Home Exercise Program - Progress: Progressing toward goal  PT Treatment Precautions/Restrictions  Precautions Required Braces or Orthoses: Yes Knee Immobilizer: On except when in CPM Restrictions Weight Bearing Restrictions: Yes RLE Weight Bearing: Weight bearing as tolerated LLE Weight Bearing: Weight bearing as tolerated Mobility (including Balance) Bed Mobility Bed Mobility: Yes Supine to Sit: 4: Min assist Supine to Sit Details (indicate cue type and reason): VC for sequencing. Assist with LLE. Pt able to control trunk with UE hold Sitting - Scoot to Edge of Bed: 5: Supervision Sitting - Scoot to Edge of Bed Details (indicate cue type and reason): VC for sequencing Transfers Transfers: Yes Sit to Stand: 4: Min assist;With upper extremity assist;With armrests;From bed;From elevated surface (pt unable to stand from low surface) Sit to Stand Details (indicate cue type and reason): VC for  hand placement for safety. Pt still wants to reach for his RW in order to stand. Assist for forward translation Stand to Sit: 4: Min assist;With armrests;With upper extremity assist;To chair/3-in-1 Stand to Sit Details: VC for hand placement. Pt able to control descent after cueing Ambulation/Gait Ambulation/Gait: Yes Ambulation/Gait Assistance: 4: Min assist Ambulation/Gait Assistance Details (indicate cue type and reason): VC for safety with RW as well as sequencing throughout for RW and LEs. When pt following cueing, he was able to stabilize himself with only minguard assist, min assist when impulsive Ambulation Distance (Feet): 100 Feet Assistive device: Rolling walker Gait Pattern: Step-to pattern;Decreased step length - right;Decreased stance time - left;Decreased hip/knee flexion - left;Trunk flexed;Decreased weight shift to left Gait velocity: Decreased gait speed Stairs: No    Exercise  Total Joint Exercises Heel Slides: AAROM;Strengthening;Left;10 reps;Seated Long Arc Quad: AROM;Strengthening;Left;10 reps;Seated End of Session PT - End of Session Equipment Utilized During Treatment: Gait belt Activity Tolerance: Patient tolerated treatment well Patient left: in chair;with call bell in reach Nurse Communication: Mobility status for transfers;Mobility status for ambulation General Behavior During Session: [facility] for tasks performed Cognition: [facility] for tasks performed  [name] 01/12/2012, 4:45 PM  01/12/2012 [name] DPT PAGER: [phone] OFFICE: [phone]

## 2012-01-12 NOTE — Anesthesia Postprocedure Evaluation (Signed)
Anesthesia Post Note  Patient: James [name]  Procedure(s) Performed:  TOTAL KNEE ARTHROPLASTY  Anesthesia type: general  Patient location: PACU  Post pain: Pain level controlled  Post assessment: Patient's Cardiovascular Status Stable  Last Vitals:  Filed Vitals:   01/12/12 0635  BP: 104/46  Pulse: 79  Temp: 37.6 C  Resp: 16    Post vital signs: Reviewed and stable  Level of consciousness: sedated  Complications: No apparent anesthesia complications

## 2012-01-12 NOTE — Progress Notes (Signed)
Occupational Therapy Evaluation Patient Details Name: ATILLA ZOLLNER MRN: [id number] DOB: 03/26/1928 Today's Date: 01/12/2012  Problem List:  Patient Active Problem List  Diagnoses  . DEFICIENCY, VITAMIN D NOS  . OBESITY, MORBID  . ANXIETY  . HYPERTENSION, BENIGN  . CAD, NATIVE VESSEL  . VASCULITIS  . OTHER DISEASE OF PHARYNX OR NASOPHARYNX  . BLEEDING GUMS  . HEMOCCULT POSITIVE STOOL  . NEPHROLITHIASIS  . BLADDER DIVERTICULUM  . SCAR CONDITION AND FIBROSIS OF SKIN  . OSTEOARTHRITIS, KNEES, BILATERAL  . LOW BACK PAIN, CHRONIC  . MYALGIA/MYOSITIS NOS  . EDEMA  . CAROTID BRUIT, RIGHT  . DYSPNEA ON EXERTION  . GLUCOSE INTOLERANCE, HX OF  . BENIGN PROSTATIC HYPERTROPHY, HX OF  . Vertigo  . Thrombocytopenia  . Melanoma    Past Medical History:  Past Medical History  Diagnosis Date  . Bradycardia     Hypertensive  . carotid bruit, right   . hypercholesterolemia     Trig 234/293;takes Simcor daily  . Obesity, morbid   . Vasculitis   . Immune thrombocytopenic purpura     Dr. Cyndie Chime  . Dermatitis     legs  . Blood in stool   . Bleeding gums   . Chronic low back pain   . Myalgia and myositis   . Unspecified vitamin D deficiency   . Bladder diverticulum   . Primary osteoarthritis of both knees   . Anxiety   . Nephrolithiasis   . History of elevated glucose   . Thrombophlebitis     right forearm  . History of BPH   . History of colonic polyps     [name]  . Epididymitis, left     S/P Epidimectomy  . Pneumonia, bacterial     RML resolved, spirometry, normal  . History of ITP 2011    per Dr. Cyndie Chime 2011, normal platelets  . Thrombocytopenia 05/20-05/27/10    Hosp ITP severe, Dr. Cyndie Chime  . Splenomegaly 05/15/09    abd U/S NML  . Hypertension, benign     takes Micardis daily  . Sleep apnea     sitting/lying/exertion  . Neuropathy     acute  . Arthritis     hands  . Bruises easily   . H/O hiatal hernia   . GERD (gastroesophageal reflux  disease)     hx of but doesn't take any meds now  . Urinary frequency   . Urinary urgency   . Nocturia   . Urinary leakage   . Malignant melanoma of ear     right  . Testicular cancer    Past Surgical History:  Past Surgical History  Procedure Date  . Kidney stone retrieval 1950's  . Fem cutaneous nerve entrapment 1977    due to surgery (mild lat anesth of bilat legs)  . Herniorraphy     x 2 right  . Limited pelvis/hip bone scan 04/99    negative  . Bone scan 04/00  . Cataract extraction 02/04    right  . Cataract extraction 02/04    righ  . Wide local excision, right ear 06/04    melanoma with flap  . Stress cardiolite 05/11/04    mild inf. ischemia, EF 71%  . Left shoulder surgery 06/17/2005  . Bronchoscopy 09/08    RML collapse with chronic pneumonia, bx neg (Dr. Delford Field)  . Right middle finger cyst excision 03/07/09    DIP Joint Mucoid (Dr. Merlyn Lot)  . Eyelid & eyebrow lift 1996  . Onion  peele, left testical   . Right middle finger cyst reexcision 08/15/09    DIP Joint Mucoid Cyst (Dr. Merlyn Lot)  . Appendectomy 1950  . Hernia repair 1959    x 2  . Excision of mucoid tumor   . Cardiac catheterization 05/23/04    mild plague-statin  . Colonoscopy   . Testicular cancer removal     OT Assessment/Plan/Recommendation OT Assessment Clinical Impression Statement: Pt s/p L TKA .  Pt with hematuria and being followed by urologist.  Will benefit from acute OT to increase I with ADLs and functional transfers in prep for safe d/c home with wife and daughter at mod I level. Educate pt on using AE for LB ADLs in order to decrease burden of care on wife. OT Recommendation/Assessment: Patient will need skilled OT in the acute care venue OT Problem List: Decreased activity tolerance;Impaired balance (sitting and/or standing);Decreased knowledge of use of DME or AE;Pain OT Therapy Diagnosis : Acute pain OT Plan OT Frequency: Min 2X/week OT Treatment/Interventions: Self-care/ADL  training;DME and/or AE instruction;Therapeutic activities;Patient/family education OT Recommendation Follow Up Recommendations: Supervision/Assistance - 24 hour Equipment Recommended: None recommended by OT Individuals Consulted Consulted and Agree with Results and Recommendations: Patient OT Goals Acute Rehab OT Goals OT Goal Formulation: With patient Time For Goal Achievement: 7 days ADL Goals Pt Will Perform Grooming: with modified independence;Standing at sink ADL Goal: Grooming - Progress: Not met Pt Will Perform Lower Body Bathing: with modified independence;Sit to stand from chair;Sit to stand from bed;with adaptive equipment ADL Goal: Lower Body Bathing - Progress: Not met Pt Will Perform Lower Body Dressing: with modified independence;Sit to stand from chair;Sit to stand from bed;with adaptive equipment ADL Goal: Lower Body Dressing - Progress: Not met Pt Will Transfer to Toilet: with modified independence;Ambulation;3-in-1;with DME ADL Goal: Toilet Transfer - Progress: Not met Pt Will Perform Toileting - Clothing Manipulation: with modified independence;Sitting on 3-in-1 or toilet;Standing ADL Goal: Toileting - Clothing Manipulation - Progress: Not met Pt Will Perform Tub/Shower Transfer: Shower transfer;with modified independence;Ambulation;with DME;Shower seat with back ADL Goal: Web designer - Progress: Not met Miscellaneous OT Goals Miscellaneous OT Goal #1: Pt will perform bed mobility with mod I in prep for EOB ADLs. OT Goal: Miscellaneous Goal #1 - Progress: Not met  OT Evaluation Precautions/Restrictions  Precautions Required Braces or Orthoses: Yes Knee Immobilizer: On except when in CPM Restrictions Weight Bearing Restrictions: Yes RLE Weight Bearing: Weight bearing as tolerated LLE Weight Bearing: Weight bearing as tolerated Prior Functioning Home Living Lives With: Spouse;Daughter Receives Help From: Family Type of Home: House Home Layout: Two  level;Able to live on main level with bedroom/bathroom Alternate Level Stairs-Rails: Can reach both Alternate Level Stairs-Number of Steps: 12 Home Access: Level entry Bathroom Shower/Tub: Health visitor: Handicapped height Bathroom Accessibility: Yes How Accessible: Accessible via walker Home Adaptive Equipment: Grab bars around toilet;Grab bars in shower;Walker - rolling;Straight cane;Crutches;Reacher;Long-handled shoehorn;Shower chair with back Additional Comments: Wife has cancer, and he goes with her to the cancer center. Prior Function Level of Independence: Independent with basic ADLs;Independent with gait;Independent with transfers Able to Take Stairs?: Yes Driving: Yes Vocation: Retired ADL ADL Grooming: Simulated;Wash/dry hands;Wash/dry face;Minimal assistance Grooming Details (indicate cue type and reason): min assist for balance during dynamic standing balance tasks Where Assessed - Grooming: Standing at sink Lower Body Bathing: Simulated;Minimal assistance Lower Body Bathing Details (indicate cue type and reason): Min assist to reach L LE foot Where Assessed - Lower Body Bathing: Sitting, chair Lower Body Dressing:  Simulated;Minimal assistance Lower Body Dressing Details (indicate cue type and reason): Min assist to don L sock/shoe due to decreased knee flexion Where Assessed - Lower Body Dressing: Sitting, chair Toilet Transfer: Performed;Moderate assistance Toilet Transfer Details (indicate cue type and reason): Mod assist to maneuver RW in bathroom while maintaining balance. Pt with loss of balance x 1 before entering bathroom due to overstepping with R LE.  VC for safe hand placement Toilet Transfer Method: Ambulating Toilet Transfer Equipment: Raised toilet seat with arms (or 3-in-1 over toilet) Toileting - Clothing Manipulation: Simulated;Minimal assistance Toileting - Clothing Manipulation Details (indicate cue type and reason): Min assist to  manipulate gown up over hips while maintaining balance. Where Assessed - Toileting Clothing Manipulation: Standing Toileting - Hygiene: Simulated;Modified independent Where Assessed - Toileting Hygiene: Sit on 3-in-1 or toilet Equipment Used: Rolling walker;Other (comment) (shoe horn with short handle (belongs to pt)) Ambulation Related to ADLs: Pt amulated to bathroom for toilet transfer with mod A and VC for gait sequencing, balance, and maneuvering RW Vision/Perception    Cognition Cognition Arousal/Alertness: Awake/alert Overall Cognitive Status: Appears within functional limits for tasks assessed Orientation Level: Oriented X4 Sensation/Coordination Coordination Gross Motor Movements are Fluid and Coordinated: Yes Fine Motor Movements are Fluid and Coordinated: Yes Extremity Assessment RUE Assessment RUE Assessment: Within Functional Limits LUE Assessment LUE Assessment: Within Functional Limits Mobility  Bed Mobility Bed Mobility: No Transfers Sit to Stand: 4: Min assist;5: Supervision;With upper extremity assist;With armrests;From chair/3-in-1 Sit to Stand Details (indicate cue type and reason): Min assist from chair. Supervision from raised toilet. VC for hand placement and LE placement. Min assist from chair for stability into standing and support Stand to Sit: 4: Min assist;With armrests;With upper extremity assist;To chair/3-in-1 Stand to Sit Details: VC for hand placement.  Pt unable to control descent Exercises   End of Session OT - End of Session Equipment Utilized During Treatment: Gait belt;Left knee immobilizer Activity Tolerance: Patient limited by fatigue Patient left: in chair;with call bell in reach General Behavior During Session: [facility] for tasks performed (Simultaneous filing. User may not have seen previous data.) Cognition: [facility] for tasks performed (Simultaneous filing. User may not have seen previous data.)   Cipriano Mile 01/12/2012, 11:04  AM  01/12/2012 Cipriano Mile OTR/L Pager [phone] Office [phone]

## 2012-01-12 NOTE — Progress Notes (Signed)
Patient ID: James [name], male   DOB: 1928-06-10, 78 y.o.   MRN: [id number] Comfortable with no complaints today.  Max temp last 48 hrs--99.8.  hgb this am--10.3.  Dr Laverle Patter the urologist is aware of his hematuria and will be following him for this.

## 2012-01-13 LAB — PROTIME-INR: INR: 1.22 (ref 0.00–1.49)

## 2012-01-13 LAB — CBC
HCT: 30 % — ABNORMAL LOW (ref 39.0–52.0)
Hemoglobin: 9.8 g/dL — ABNORMAL LOW (ref 13.0–17.0)
MCV: 92.6 fL (ref 78.0–100.0)
WBC: 10 10*3/uL (ref 4.0–10.5)

## 2012-01-13 MED ORDER — WARFARIN SODIUM 5 MG PO TABS
5.0000 mg | ORAL_TABLET | Freq: Once | ORAL | Status: AC
  Administered 2012-01-13: 5 mg via ORAL
  Filled 2012-01-13: qty 1

## 2012-01-13 MED ORDER — HYOSCYAMINE SULFATE ER 0.375 MG PO TB12
0.3750 mg | ORAL_TABLET | Freq: Two times a day (BID) | ORAL | Status: DC
  Administered 2012-01-14: 0.375 mg via ORAL
  Filled 2012-01-13 (×3): qty 1

## 2012-01-13 NOTE — Progress Notes (Signed)
Orthopedics Progress Note  Subjective: Patient is doing well with the knee.  He is concerned about the blood in the urine and does not feel that he is ready to go home today. Objective:  Filed Vitals:   01/13/12 0522  BP: 127/63  Pulse: 76  Temp: 99.6 F (37.6 C)  Resp: 18    General: Awake and alert  Musculoskeletal: knee incision CDI, no erythema.  Full extension.  Good quad.  Pain free ankle pumps. Neurovascularly intact  Lab Results  Component Value Date   WBC 10.0 01/13/2012   HGB 9.8* 01/13/2012   HCT 30.0* 01/13/2012   MCV 92.6 01/13/2012   PLT 151 01/13/2012       Component Value Date/Time   NA 136 01/11/2012 0600   K 4.1 01/11/2012 0600   CL 101 01/11/2012 0600   CO2 27 01/11/2012 0600   GLUCOSE 120* 01/11/2012 0600   BUN 11 01/11/2012 0600   CREATININE 0.99 01/11/2012 0600   CALCIUM 8.0* 01/11/2012 0600   GFRNONAA 75* 01/11/2012 0600   GFRAA 87* 01/11/2012 0600    Lab Results  Component Value Date   INR 1.22 01/13/2012   INR 1.19 01/12/2012   INR 1.11 01/11/2012    Assessment/Plan: POD #3 s/p Procedure(s): TOTAL KNEE ARTHROPLASTY Stable orthopedically.  Continue PT, OT. INR  Still subtherapeutic.  Due to ITP and cipro, pharmacy wanting to go slowly. Plan D/C tomorrow.  Almedia Balls. Ranell Patrick, MD 01/13/2012 8:19 AM

## 2012-01-13 NOTE — Progress Notes (Signed)
MEDICATION RELATED CONSULT NOTE - FOLLOW UP   Pharmacy Consult for Coumadin and Cipro Indication: VTE px s/p L-TKA & UTI  Allergies  Allergen Reactions  . Penicillins     REACTION: Rash, swelling  . Rofecoxib   . Sulfamethoxazole W/Trimethoprim     REACTION: questional onset of low platelets and bleeding  . Tramadol Hcl     REACTION: dizziness, drowsiness  . Neosporin (Neomycin-Polymyx-Gramicid) Rash    Patient Measurements: Height: 5\' 11"  (180.3 cm) Weight: 266 lb 1.5 oz (120.699 kg) IBW/kg (Calculated) : 75.3    Vital Signs: Temp: 99.6 F (37.6 C) (01/21 0522) Temp src: Oral (01/21 0522) BP: 127/63 mmHg (01/21 0522) Pulse Rate: 76  (01/21 0522) Intake/Output from previous day: 01/20 0701 - 01/21 0700 In: 900 [P.O.:900] Out: 4150 [Urine:4150] Intake/Output from this shift:    Labs:  Basename 01/13/12 0645 01/12/12 0650 01/11/12 0600  WBC 10.0 9.4 10.6*  HGB 9.8* 10.3* 11.2*  HCT 30.0* 31.5* 34.6*  PLT 151 160 176  APTT -- -- --  CREATININE -- -- 0.99  LABCREA -- -- --  CREATININE -- -- 0.99  CREAT24HRUR -- -- --  MG -- -- --  PHOS -- -- --  ALBUMIN -- -- --  PROT -- -- --  ALBUMIN -- -- --  AST -- -- --  ALT -- -- --  ALKPHOS -- -- --  BILITOT -- -- --  BILIDIR -- -- --  IBILI -- -- --   Estimated Creatinine Clearance: 77.4 ml/min (by C-G formula based on Cr of 0.99).   Microbiology: Recent Results (from the past 720 hour(s))  SURGICAL PCR SCREEN     Status: Normal   Collection Time   12/31/11 10:42 AM      Component Value Range Status Comment   MRSA, PCR NEGATIVE  NEGATIVE  Final    Staphylococcus aureus NEGATIVE  NEGATIVE  Final   URINE CULTURE     Status: Normal   Collection Time   01/11/12  1:30 PM      Component Value Range Status Comment   Specimen Description URINE, CLEAN CATCH   Final    Special Requests NONE   Final    Setup Time [id number]   Final    Colony Count >=100,000 COLONIES/ML   Final    Culture KLEBSIELLA PNEUMONIAE    Final    Report Status 01/13/2012 FINAL   Final    Organism ID, Bacteria KLEBSIELLA PNEUMONIAE   Final     Medications:  Scheduled:    . aspirin EC  81 mg Oral QPM  . cholecalciferol  2,000 Units Oral Daily  . ciprofloxacin  250 mg Oral BID  . ferrous sulfate  325 mg Oral TID PC  . furosemide  80 mg Oral Daily  . guaiFENesin  400 mg Oral 6 X Daily  . multivitamin  1 tablet Oral Daily  . niacin  1,000 mg Oral QHS  . olmesartan  10 mg Oral Daily  . patient's guide to using coumadin book   Does not apply Once  . psyllium  1 packet Oral Daily  . simvastatin  20 mg Oral QHS  . warfarin  2.5 mg Oral ONCE-1800  . warfarin   Does not apply Once    Assessment: 78yo male s/p elective L-TKA, on Coumadin for VTE px.  All doses have been charted & INR is moving slowly toward goal of 2-3.  It is 1.22 this AM.  Pt with recent h/o ITP.  There is continued hematuria with UTI.   Urine cx resulting this AM as (+)Kleb. Pneumoniae > 100K, sensitivities pending at this time.  He is on day#3 of Cipro.  Goal of Therapy:  INR 2-3  Plan:  1.  Will increase Coumadin to 5mg  today 2.  Continue Cipro 250mg  po bid 3.  F/U cx results, watch CBC given h/o ITP.  Charrise Lardner P 01/13/2012,9:40 AM

## 2012-01-13 NOTE — Consult Note (Signed)
Urology Consult  Referring physician: Ranell Patrick Reason for referral: Gross Hematuria  Chief Complaint: Gross hematuria post op  History of Present Illness: 78 yo male last seen Sept for urge incontinence, last treated with PTNS neuromodulation for OAB, now  post TKA on Friday, but awakening Saturday AM with bright red bloody urine, turning to black  (old blood), and now clear. He is having significant spasms. Foley irrigated clear today.    Past Medical History  Diagnosis Date  . Bradycardia     Hypertensive  . carotid bruit, right   . hypercholesterolemia     Trig 234/293;takes Simcor daily  . Obesity, morbid   . Vasculitis   . Immune thrombocytopenic purpura     Dr. Cyndie Chime  . Dermatitis     legs  . Blood in stool   . Bleeding gums   . Chronic low back pain   . Myalgia and myositis   . Unspecified vitamin D deficiency   . Bladder diverticulum   . Primary osteoarthritis of both knees   . Anxiety   . Nephrolithiasis   . History of elevated glucose   . Thrombophlebitis     right forearm  . History of BPH   . History of colonic polyps     [name]  . Epididymitis, left     S/P Epidimectomy  . Pneumonia, bacterial     RML resolved, spirometry, normal  . History of ITP 2011    per Dr. Cyndie Chime 2011, normal platelets  . Thrombocytopenia 05/20-05/27/10    Hosp ITP severe, Dr. Cyndie Chime  . Splenomegaly 05/15/09    abd U/S NML  . Hypertension, benign     takes Micardis daily  . Sleep apnea     sitting/lying/exertion  . Neuropathy     acute  . Arthritis     hands  . Bruises easily   . H/O hiatal hernia   . GERD (gastroesophageal reflux disease)     hx of but doesn't take any meds now  . Urinary frequency   . Urinary urgency   . Nocturia   . Urinary leakage   . Malignant melanoma of ear     right  . Testicular cancer    Past Surgical History  Procedure Date  . Kidney stone retrieval 1950's  . Fem cutaneous nerve entrapment 1977    due to surgery  (mild lat anesth of bilat legs)  . Herniorraphy     x 2 right  . Limited pelvis/hip bone scan 04/99    negative  . Bone scan 04/00  . Cataract extraction 02/04    right  . Cataract extraction 02/04    righ  . Wide local excision, right ear 06/04    melanoma with flap  . Stress cardiolite 05/11/04    mild inf. ischemia, EF 71%  . Left shoulder surgery 06/17/2005  . Bronchoscopy 09/08    RML collapse with chronic pneumonia, bx neg (Dr. Delford Field)  . Right middle finger cyst excision 03/07/09    DIP Joint Mucoid (Dr. Merlyn Lot)  . Eyelid & eyebrow lift 1996  . Onion peele, left testical   . Right middle finger cyst reexcision 08/15/09    DIP Joint Mucoid Cyst (Dr. Merlyn Lot)  . Appendectomy 1950  . Hernia repair 1959    x 2  . Excision of mucoid tumor   . Cardiac catheterization 05/23/04    mild plague-statin  . Colonoscopy   . Testicular cancer removal     Medications: I have reviewed  the patient's current medications. Allergies:  Allergies  Allergen Reactions  . Penicillins     REACTION: Rash, swelling  . Rofecoxib   . Sulfamethoxazole W/Trimethoprim     REACTION: questional onset of low platelets and bleeding  . Tramadol Hcl     REACTION: dizziness, drowsiness  . Neosporin (Neomycin-Polymyx-Gramicid) Rash    Family History  Problem Relation Age of Onset  . Cancer Mother     ? thyroid  . Diabetes Father   . Heart disease Father     MI  . Stroke Brother     cerebral hemm  . Cancer Other     colon, ? brother  . Anesthesia problems Neg Hx   . Hypotension Neg Hx   . Malignant hyperthermia Neg Hx   . Pseudochol deficiency Neg Hx    Social History:  reports that he quit smoking about 44 years ago. He has never used smokeless tobacco. He reports that he drinks alcohol. He reports that he does not use illicit drugs.  ROS: All systems are reviewed and negative except as noted. Significant urinary frequency and urgency, not responding to medications, but improved with PTNS  in office. Now with significant hard spasms with foley in place.   Physical Exam:  Vital signs in last 24 hours: Temp:  [97.6 F (36.4 C)-99.6 F (37.6 C)] 97.6 F (36.4 C) (01/21 1332) Pulse Rate:  [71-76] 71  (01/21 1332) Resp:  [18-20] 20  (01/21 1332) BP: (127-164)/(51-69) 128/51 mmHg (01/21 1332) SpO2:  [94 %-96 %] 96 % (01/21 1332) Weight:  [120.699 kg (266 lb 1.5 oz)] 120.699 kg (266 lb 1.5 oz) (01/21 4098)  Cardiovascular: Skin warm; not flushed Respiratory: Breaths quiet; no shortness of breath Abdomen: No masses Neurological: Normal sensation to touch Musculoskeletal: Normal motor function arms and legs Lymphatics: No inguinal adenopathy Skin: No rashes Genitourinary:retracted penis. Foley in good position, draining well.   Laboratory Data:  Results for orders placed during the hospital encounter of 01/10/12 (from the past 72 hour(s))  URINALYSIS, ROUTINE W REFLEX MICROSCOPIC     Status: Abnormal   Collection Time   01/10/12  9:10 PM      Component Value Range Comment   Color, Urine RED (*) YELLOW  BIOCHEMICALS MAY BE AFFECTED BY COLOR   APPearance TURBID (*) CLEAR     Specific Gravity, Urine 1.012  1.005 - 1.030     pH 5.0  5.0 - 8.0     Glucose, UA NEGATIVE  NEGATIVE (mg/dL)    Hgb urine dipstick LARGE (*) NEGATIVE     Bilirubin Urine LARGE (*) NEGATIVE     Ketones, ur 15 (*) NEGATIVE (mg/dL)    Protein, ur 119 (*) NEGATIVE (mg/dL)    Urobilinogen, UA 1.0  0.0 - 1.0 (mg/dL)    Nitrite POSITIVE (*) NEGATIVE     Leukocytes, UA MODERATE (*) NEGATIVE    URINE MICROSCOPIC-ADD ON     Status: Normal   Collection Time   01/10/12  9:10 PM      Component Value Range Comment   WBC, UA 7-10  <3 (WBC/hpf)    RBC / HPF TOO NUMEROUS TO COUNT  <3 (RBC/hpf)    Bacteria, UA RARE  RARE    PROTIME-INR     Status: Normal   Collection Time   01/11/12  6:00 AM      Component Value Range Comment   Prothrombin Time 14.5  11.6 - 15.2 (seconds)    INR 1.11  0.00 -  1.49    CBC      Status: Abnormal   Collection Time   01/11/12  6:00 AM      Component Value Range Comment   WBC 10.6 (*) 4.0 - 10.5 (K/uL)    RBC 3.74 (*) 4.22 - 5.81 (MIL/uL)    Hemoglobin 11.2 (*) 13.0 - 17.0 (g/dL)    HCT 69.6 (*) 29.5 - 52.0 (%)    MCV 92.5  78.0 - 100.0 (fL)    MCH 29.9  26.0 - 34.0 (pg)    MCHC 32.4  30.0 - 36.0 (g/dL)    RDW 28.4  13.2 - 44.0 (%)    Platelets 176  150 - 400 (K/uL)   BASIC METABOLIC PANEL     Status: Abnormal   Collection Time   01/11/12  6:00 AM      Component Value Range Comment   Sodium 136  135 - 145 (mEq/L)    Potassium 4.1  3.5 - 5.1 (mEq/L)    Chloride 101  96 - 112 (mEq/L)    CO2 27  19 - 32 (mEq/L)    Glucose, Bld 120 (*) 70 - 99 (mg/dL)    BUN 11  6 - 23 (mg/dL)    Creatinine, Ser 1.02  0.50 - 1.35 (mg/dL)    Calcium 8.0 (*) 8.4 - 10.5 (mg/dL)    GFR calc non Af Amer 75 (*) >90 (mL/min)    GFR calc Af Amer 87 (*) >90 (mL/min)   URINE CULTURE     Status: Normal   Collection Time   01/11/12  1:30 PM      Component Value Range Comment   Specimen Description URINE, CLEAN CATCH      Special Requests NONE      Setup Time [id number]      Colony Count >=100,000 COLONIES/ML      Culture KLEBSIELLA PNEUMONIAE      Report Status 01/13/2012 FINAL      Organism ID, Bacteria KLEBSIELLA PNEUMONIAE     PROTIME-INR     Status: Abnormal   Collection Time   01/12/12  6:50 AM      Component Value Range Comment   Prothrombin Time 15.4 (*) 11.6 - 15.2 (seconds)    INR 1.19  0.00 - 1.49    CBC     Status: Abnormal   Collection Time   01/12/12  6:50 AM      Component Value Range Comment   WBC 9.4  4.0 - 10.5 (K/uL)    RBC 3.36 (*) 4.22 - 5.81 (MIL/uL)    Hemoglobin 10.3 (*) 13.0 - 17.0 (g/dL)    HCT 42.5 (*) 95.6 - 52.0 (%)    MCV 93.8  78.0 - 100.0 (fL)    MCH 30.7  26.0 - 34.0 (pg)    MCHC 32.7  30.0 - 36.0 (g/dL)    RDW 38.7  56.4 - 33.2 (%)    Platelets 160  150 - 400 (K/uL)   PROTIME-INR     Status: Abnormal   Collection Time   01/13/12  6:45 AM        Component Value Range Comment   Prothrombin Time 15.7 (*) 11.6 - 15.2 (seconds)    INR 1.22  0.00 - 1.49    CBC     Status: Abnormal   Collection Time   01/13/12  6:45 AM      Component Value Range Comment   WBC 10.0  4.0 - 10.5 (K/uL)    RBC  3.24 (*) 4.22 - 5.81 (MIL/uL)    Hemoglobin 9.8 (*) 13.0 - 17.0 (g/dL)    HCT 78.2 (*) 95.6 - 52.0 (%)    MCV 92.6  78.0 - 100.0 (fL)    MCH 30.2  26.0 - 34.0 (pg)    MCHC 32.7  30.0 - 36.0 (g/dL)    RDW 21.3  08.6 - 57.8 (%)    Platelets 151  150 - 400 (K/uL)    Recent Results (from the past 240 hour(s))  URINE CULTURE     Status: Normal   Collection Time   01/11/12  1:30 PM      Component Value Range Status Comment   Specimen Description URINE, CLEAN CATCH   Final    Special Requests NONE   Final    Setup Time [id number]   Final    Colony Count >=100,000 COLONIES/ML   Final    Culture KLEBSIELLA PNEUMONIAE   Final    Report Status 01/13/2012 FINAL   Final    Organism ID, Bacteria KLEBSIELLA PNEUMONIAE   Final    Creatinine:  Basename 01/11/12 0600  CREATININE 0.99    Xrays: See report/chart   Impression/Assessment: 1. Post op gross hematuria, possibly from traumatic intra-op cath, but may represent hemorrhagic cystitis from Klebsiella UTI, or even anticoagulative therapy. (or combination). He has not responded to anticholinergics well in the past, but will order Levsinex for him, and cath irrigations, and antibiotic coverage. Would like to remove cath in AM if urine clear. .   Plan:  1. Cipro Rx of UTI, Levsin SL, Remove foley if clear in AM. Irrigate foley as needed  James [name] I 01/13/2012, 4:12 PM

## 2012-01-13 NOTE — Progress Notes (Signed)
S: Pan Sensitive Klebsiella UTI: on cipro 250 bid.    Pt still having spasms. A: needs prn irrigation, and will order levsinex for chronic OAB.

## 2012-01-13 NOTE — Progress Notes (Signed)
Physical Therapy Treatment Patient Details Name: James [name] MRN: [id number] DOB: 23-Nov-1928 Today's Date: 01/13/2012  PT Assessment/Plan  PT - Assessment/Plan Comments on Treatment Session: patient progressing towards all goals however con't to report burning sensation at penis at catheter site. Patient with overall decreased endurance however appropriate with age. PT Plan: Discharge plan remains appropriate;Frequency remains appropriate PT Frequency: 7X/week Follow Up Recommendations: Home health PT  PT Goals  Acute Rehab PT Goals PT Goal: Supine/Side to Sit - Progress: Progressing toward goal PT Goal: Sit to Supine/Side - Progress: Progressing toward goal PT Goal: Sit to Stand - Progress: Progressing toward goal PT Goal: Stand to Sit - Progress: Progressing toward goal PT Transfer Goal: Bed to Chair/Chair to Bed - Progress: Progressing toward goal PT Goal: Ambulate - Progress: Progressing toward goal PT Goal: Perform Home Exercise Program - Progress: Progressing toward goal  PT Treatment Precautions/Restrictions  Precautions Precautions: Knee Required Braces or Orthoses:  (per patient and MD, patient only needs KI at night) Knee Immobilizer:  (patient no longer requires L KI for ambulation) Restrictions Weight Bearing Restrictions: Yes RLE Weight Bearing: Weight bearing as tolerated LLE Weight Bearing: Weight bearing as tolerated Mobility (including Balance) Bed Mobility Bed Mobility:  (pt received up in chair) Transfers Sit to Stand: 4: Min assist (contact guard assist) Stand to Sit: 4: Min assist (contact guard assist) Stand to Sit Details: pt aware to reach back with hands for arm rests of chair Ambulation/Gait Ambulation/Gait Assistance: 4: Min assist (contact guard assist) Ambulation/Gait Assistance Details (indicate cue type and reason): patient required 2 standing rest breaks due "decreased wind" Patient with improved gait fluidity towards end of  ambulation Ambulation Distance (Feet): 150 Feet Assistive device: Rolling walker Gait Pattern: Step-through pattern;Decreased step length - left;Decreased stance time - left;Antalgic Gait velocity: decreased cadence Stairs: No (patient reports level entry to enter home and reports having an 8 in step to get into bed however will be sleeping in a recliner when he goes home.  Exercise  Total Joint Exercises Ankle Circles/Pumps: AROM;10 reps;Seated Quad Sets: AROM;Left;10 reps;Seated (with LEs elevated) Short Arc Quad: AROM;10 reps;Seated;Left Heel Slides: AAROM;Left;10 reps;Seated End of Session PT - End of Session Equipment Utilized During Treatment: Gait belt Activity Tolerance: Patient tolerated treatment well Patient left: in chair;with call bell in reach;with family/visitor present Nurse Communication:  (RN notified of leakage from urinary catheter) General Behavior During Session: [facility] for tasks performed Cognition: [facility] for tasks performed  Marcene Brawn 01/13/2012, 2:22 PM  Lewis Shock, PT, DPT Pager #: [phone] Office #: [phone]

## 2012-01-13 NOTE — Progress Notes (Signed)
Physical Therapy Treatment Patient Details Name: James [name] MRN: [id number] DOB: [birth year] Today's Date: 01/13/2012  PT Assessment/Plan  PT - Assessment/Plan Comments on Treatment Session: Patient progressing towards all goals with increased ambulation endurance. Patient with leakage from urinary catheter including blood. RN notified and MD aware. PT Plan: Discharge plan remains appropriate;Frequency remains appropriate PT Frequency: 7X/week Follow Up Recommendations: Home health PT;Supervision/Assistance - 24 hour  PT Goals  Acute Rehab PT Goals PT Goal: Supine/Side to Sit - Progress: Progressing toward goal PT Goal: Sit to Supine/Side - Progress: Progressing toward goal PT Goal: Sit to Stand - Progress: Progressing toward goal PT Goal: Stand to Sit - Progress: Progressing toward goal PT Transfer Goal: Bed to Chair/Chair to Bed - Progress: Progressing toward goal PT Goal: Ambulate - Progress: Progressing toward goal PT Goal: Perform Home Exercise Program - Progress: Progressing toward goal  PT Treatment Precautions/Restrictions  Precautions Precautions: Knee Required Braces or Orthoses:  (per patient and MD, patient only needs KI at night)  Restrictions Weight Bearing Restrictions: Yes RLE Weight Bearing: Weight bearing as tolerated LLE Weight Bearing: Weight bearing as tolerated Mobility (including Balance) Bed Mobility Bed Mobility:  (patient received up in chair) Transfers Sit to Stand: 4: Min assist Sit to Stand Details (indicate cue type and reason): contact guard assist Stand to Sit: 4: Min assist (contact guard assist) Ambulation/Gait Ambulation/Gait Assistance: 4: Min assist (contact guard assist) Ambulation/Gait Assistance Details (indicate cue type and reason): patient took 2 standing rest breaks Ambulation Distance (Feet): 150 Feet Assistive device: Rolling walker Gait Pattern: Step-to pattern;Decreased step length - left;Decreased stance time -  left;Antalgic Gait velocity: decreased cadence  Balance Balance Assessed: Yes Static Standing Balance Static Standing - Balance Support: No upper extremity supported (stood at sink to wash hands) Static Standing - Level of Assistance: 5: Stand by assistance Static Standing - Comment/# of Minutes: 5  Exercise  Total Joint Exercises Ankle Circles/Pumps: AROM;10 reps;Seated Quad Sets: AROM;10 reps;Left;Seated (with LEs elevated) Heel Slides: AROM;Left;10 reps;Seated End of Session PT - End of Session Equipment Utilized During Treatment: Gait belt Activity Tolerance: Patient tolerated treatment well Patient left: in chair (OT present) Nurse Communication:  (RN notified of leakage from urinary catheter) General Behavior During Session: [facility] for tasks performed Cognition: [facility] for tasks performed  Patient stood in RW x 2 minutes while PT assist with doffing shorts due to urinary leakage from catheter.  James [name] 01/13/2012, 1:17 PM  Lewis Shock, PT, DPT Pager #: [phone] Office #: [phone]

## 2012-01-13 NOTE — Progress Notes (Signed)
Occupational Therapy Treatment Patient Details Name: James [name] MRN: [id number] DOB: Jul 23, 1928 Today's Date: 01/13/2012  OT Assessment/Plan OT Assessment/Plan Comments on Treatment Session: Pt. moving well today and will benefit from increased education on LB ADLs with AE and will complete shower transfer tomorrow. OT Plan: Discharge plan remains appropriate OT Frequency: Min 2X/week Follow Up Recommendations: Supervision/Assistance - 24 hour Equipment Recommended: None recommended by OT OT Goals Acute Rehab OT Goals OT Goal Formulation: With patient Time For Goal Achievement: 7 days ADL Goals Pt Will Perform Grooming: with modified independence;Standing at sink ADL Goal: Grooming - Progress: Progressing toward goals Pt Will Perform Lower Body Bathing: with modified independence;Sit to stand from chair;Sit to stand from bed;with adaptive equipment ADL Goal: Lower Body Bathing - Progress: Progressing toward goals Pt Will Perform Lower Body Dressing: with modified independence;Sit to stand from chair;Sit to stand from bed;with adaptive equipment ADL Goal: Lower Body Dressing - Progress: Progressing toward goals Pt Will Transfer to Toilet: with modified independence;Ambulation;3-in-1;with DME ADL Goal: Toilet Transfer - Progress: Progressing toward goals Pt Will Perform Toileting - Clothing Manipulation: with modified independence;Sitting on 3-in-1 or toilet;Standing ADL Goal: Toileting - Clothing Manipulation - Progress: Progressing toward goals Pt Will Perform Tub/Shower Transfer: Shower transfer;with modified independence;Ambulation;with DME;Shower seat with back ADL Goal: Web designer - Progress: Progressing toward goals Miscellaneous OT Goals Miscellaneous OT Goal #1: Pt will perform bed mobility with mod I in prep for EOB ADLs. OT Goal: Miscellaneous Goal #1 - Progress: Not met  OT Treatment Precautions/Restrictions  Precautions Precautions: Knee Required Braces or  Orthoses: Yes Knee Immobilizer: On except when in CPM Restrictions Weight Bearing Restrictions: Yes RLE Weight Bearing: Weight bearing as tolerated LLE Weight Bearing: Weight bearing as tolerated   ADL ADL Lower Body Bathing: Performed;Minimal assistance Lower Body Bathing Details (indicate cue type and reason): Min assist to reach L LE foot Where Assessed - Lower Body Bathing: Sit to stand from chair Upper Body Dressing: Performed;Set up Upper Body Dressing Details (indicate cue type and reason): With doffing shirt and donning gown Where Assessed - Upper Body Dressing: Sitting, chair Lower Body Dressing: Simulated;Minimal assistance Lower Body Dressing Details (indicate cue type and reason): With use of reacher for doffing sock and sock aid to don sock left side Where Assessed - Lower Body Dressing: Sitting, chair Toilet Transfer: Performed;Minimal assistance Toilet Transfer Details (indicate cue type and reason): With min verbal cues for hand placement on arm rests Toilet Transfer Method: Ambulating Toilet Transfer Equipment: Raised toilet seat with arms (or 3-in-1 over toilet) Ambulation Related to ADLs: Pt. min assist with min verbal cues for use of RW to steer around objects. ADL Comments: Pt. educated on use of AE for LB dressing and demonstrated shower transfer technique. Mobility  Bed Mobility Bed Mobility: No  End of Session OT - End of Session Equipment Utilized During Treatment: Gait belt;Left knee immobilizer Activity Tolerance: Patient limited by fatigue Patient left: in chair;with call bell in reach General Behavior During Session: [facility] for tasks performed Cognition: [facility] for tasks performed  Cassandria Anger, OTR/L Pager [phone]  01/13/2012, 11:37 AM

## 2012-01-14 ENCOUNTER — Encounter (HOSPITAL_COMMUNITY): Payer: Self-pay | Admitting: Orthopedic Surgery

## 2012-01-14 LAB — PROTIME-INR: INR: 1.2 (ref 0.00–1.49)

## 2012-01-14 MED ORDER — WARFARIN SODIUM 5 MG PO TABS: 5.0000 mg | ORAL_TABLET | Freq: Every day | ORAL | Status: DC

## 2012-01-14 MED ORDER — OXYCODONE-ACETAMINOPHEN 5-325 MG PO TABS: 1.0000 | ORAL_TABLET | ORAL | Status: AC | PRN

## 2012-01-14 MED ORDER — METHOCARBAMOL 500 MG PO TABS: 500.0000 mg | ORAL_TABLET | Freq: Three times a day (TID) | ORAL | Status: AC | PRN

## 2012-01-14 NOTE — Discharge Summary (Signed)
Physician Discharge Summary  Patient ID: James [name] MRN: [id number] DOB/AGE: July 28, 1928 78 y.o.  Admit date: 01/10/2012 Discharge date: 01/14/2012  Admission Diagnoses:  Knee arthritis  Discharge Diagnoses:  Same   Surgeries: Procedure(s): TOTAL KNEE ARTHROPLASTY on 01/10/2012   Consultants: PT,OT, D/C planning  Discharged Condition: Stable  Hospital Course: James [name] is an 78 y.o. male who was admitted 01/10/2012 with a chief complaint of knee pain, and found to have a diagnosis of knee arthritis.  They were brought to the operating room on 01/10/2012 and underwent the above named procedures.    The patient had an uncomplicated hospital course and was stable for discharge.  Recent vital signs:  Filed Vitals:   01/14/12 0612  BP: 122/60  Pulse: 70  Temp: 99.2 F (37.3 C)  Resp: 20    Recent laboratory studies:  Results for orders placed during the hospital encounter of 01/10/12  PROTIME-INR      Component Value Range   Prothrombin Time 12.9  11.6 - 15.2 (seconds)   INR 0.95  0.00 - 1.49   PROTIME-INR      Component Value Range   Prothrombin Time 14.5  11.6 - 15.2 (seconds)   INR 1.11  0.00 - 1.49   CBC      Component Value Range   WBC 10.6 (*) 4.0 - 10.5 (K/uL)   RBC 3.74 (*) 4.22 - 5.81 (MIL/uL)   Hemoglobin 11.2 (*) 13.0 - 17.0 (g/dL)   HCT 53.6 (*) 64.4 - 52.0 (%)   MCV 92.5  78.0 - 100.0 (fL)   MCH 29.9  26.0 - 34.0 (pg)   MCHC 32.4  30.0 - 36.0 (g/dL)   RDW 03.4  74.2 - 59.5 (%)   Platelets 176  150 - 400 (K/uL)  BASIC METABOLIC PANEL      Component Value Range   Sodium 136  135 - 145 (mEq/L)   Potassium 4.1  3.5 - 5.1 (mEq/L)   Chloride 101  96 - 112 (mEq/L)   CO2 27  19 - 32 (mEq/L)   Glucose, Bld 120 (*) 70 - 99 (mg/dL)   BUN 11  6 - 23 (mg/dL)   Creatinine, Ser 6.38  0.50 - 1.35 (mg/dL)   Calcium 8.0 (*) 8.4 - 10.5 (mg/dL)   GFR calc non Af Amer 75 (*) >90 (mL/min)   GFR calc Af Amer 87 (*) >90 (mL/min)  URINALYSIS, ROUTINE W REFLEX  MICROSCOPIC      Component Value Range   Color, Urine RED (*) YELLOW    APPearance TURBID (*) CLEAR    Specific Gravity, Urine 1.012  1.005 - 1.030    pH 5.0  5.0 - 8.0    Glucose, UA NEGATIVE  NEGATIVE (mg/dL)   Hgb urine dipstick LARGE (*) NEGATIVE    Bilirubin Urine LARGE (*) NEGATIVE    Ketones, ur 15 (*) NEGATIVE (mg/dL)   Protein, ur 756 (*) NEGATIVE (mg/dL)   Urobilinogen, UA 1.0  0.0 - 1.0 (mg/dL)   Nitrite POSITIVE (*) NEGATIVE    Leukocytes, UA MODERATE (*) NEGATIVE   URINE MICROSCOPIC-ADD ON      Component Value Range   WBC, UA 7-10  <3 (WBC/hpf)   RBC / HPF TOO NUMEROUS TO COUNT  <3 (RBC/hpf)   Bacteria, UA RARE  RARE   URINE CULTURE      Component Value Range   Specimen Description URINE, CLEAN CATCH     Special Requests NONE     Setup Time  [id number]     Colony Count >=100,000 COLONIES/ML     Culture KLEBSIELLA PNEUMONIAE     Report Status 01/13/2012 FINAL     Organism ID, Bacteria KLEBSIELLA PNEUMONIAE    PROTIME-INR      Component Value Range   Prothrombin Time 15.4 (*) 11.6 - 15.2 (seconds)   INR 1.19  0.00 - 1.49   CBC      Component Value Range   WBC 9.4  4.0 - 10.5 (K/uL)   RBC 3.36 (*) 4.22 - 5.81 (MIL/uL)   Hemoglobin 10.3 (*) 13.0 - 17.0 (g/dL)   HCT 81.1 (*) 91.4 - 52.0 (%)   MCV 93.8  78.0 - 100.0 (fL)   MCH 30.7  26.0 - 34.0 (pg)   MCHC 32.7  30.0 - 36.0 (g/dL)   RDW 78.2  95.6 - 21.3 (%)   Platelets 160  150 - 400 (K/uL)  PROTIME-INR      Component Value Range   Prothrombin Time 15.7 (*) 11.6 - 15.2 (seconds)   INR 1.22  0.00 - 1.49   CBC      Component Value Range   WBC 10.0  4.0 - 10.5 (K/uL)   RBC 3.24 (*) 4.22 - 5.81 (MIL/uL)   Hemoglobin 9.8 (*) 13.0 - 17.0 (g/dL)   HCT 08.6 (*) 57.8 - 52.0 (%)   MCV 92.6  78.0 - 100.0 (fL)   MCH 30.2  26.0 - 34.0 (pg)   MCHC 32.7  30.0 - 36.0 (g/dL)   RDW 46.9  62.9 - 52.8 (%)   Platelets 151  150 - 400 (K/uL)  PROTIME-INR      Component Value Range   Prothrombin Time 15.5 (*) 11.6 - 15.2  (seconds)   INR 1.20  0.00 - 1.49     Discharge Medications:   Medication List  As of 01/14/2012  7:40 AM   ASK your doctor about these medications         amoxicillin 500 MG capsule   Commonly known as: AMOXIL   Take 500 mg by mouth 3 (three) times daily. Stop date: 07/03/12      aspirin 81 MG tablet   Take 81 mg by mouth every evening.      furosemide 40 MG tablet   Commonly known as: LASIX   Take 80 mg by mouth daily.      guaifenesin 400 MG Tabs   Commonly known as: HUMIBID E   Take 400 mg by mouth every 4 (four) hours. For cough & congestion      meclizine 25 MG tablet   Commonly known as: ANTIVERT   Take 1 tablet (25 mg total) by mouth 3 (three) times daily as needed for dizziness or nausea.      metroNIDAZOLE 250 MG tablet   Commonly known as: FLAGYL   Take 250 mg by mouth 3 (three) times daily. Stop date: 01/06/12      multivitamin tablet   Take 1 tablet by mouth daily.      NITROSTAT 0.4 MG SL tablet   Generic drug: nitroGLYCERIN   Place 0.4 mg under the tongue every 5 (five) minutes as needed. For chest pain      OSTEO BI-FLEX JOINT SHIELD PO   Take 2 tablets by mouth 2 (two) times daily.      PSYLLIUM PO   Take 20 mLs by mouth every evening. Powder. 4 teaspoonfuls every evening      SIMCOR 1000-20 MG 24 hr tablet   Generic  drug: niacin-simvastatin   Take 1 tablet by mouth at bedtime.      telmisartan 20 MG tablet   Commonly known as: MICARDIS   Take 1 tablet (20 mg total) by mouth daily.      Vitamin D3 2000 UNITS capsule   Take 2,000 Units by mouth daily.            Diagnostic Studies: Dg Chest 2 View  12/31/2011  *RADIOLOGY REPORT*  Clinical Data: Preop  CHEST - 2 VIEW  Comparison: 10/11/2008  Findings: Cardiomediastinal silhouette is stable. No acute infiltrate or pleural effusion.  No pulmonary edema. Atherosclerotic calcifications of thoracic aorta again noted. Stable degenerative changes thoracic spine.  IMPRESSION: No active disease.  No  significant change.  Original Report Authenticated By: Natasha Mead, M.D.   X-ray Knee Left Port  01/10/2012  *RADIOLOGY REPORT*  Clinical Data: Postop left knee replacement  PORTABLE LEFT KNEE - 1-2 VIEW  Comparison: MRI of the left knee of 04/05/2010  Findings: Two portable views of the left knee show the femoral and tibial components of the left total knee replacement to be in good position.  No complicating features are seen.  Some air is noted in the soft tissues postoperatively.  IMPRESSION: The components of the left total knee replacement are in good position and alignment on the portable views obtained.  Original Report Authenticated By: Juline Patch, M.D.    Disposition: d/c/ home with [facility] PT OT  Discharge Orders    Future Appointments: Provider: Department: Dept Phone: Center:   05/28/2012 9:30 AM Joaquim Nam, MD [facility] [phone] [email or web address]      Follow-up Information    Follow up with Verlee Rossetti, MD. Schedule an appointment as soon as possible for a visit in 2 weeks. [phone])    Contact information:   [facility] [address] [place] [address] [phone]           Signed: Verlee Rossetti 01/14/2012, 7:40 AM

## 2012-01-14 NOTE — Progress Notes (Signed)
Occupational Therapy Treatment Patient Details Name: James [name] MRN: [id number] DOB: August 28, 1928 Today's Date: 01/14/2012  OT Assessment/Plan OT Assessment/Plan Comments on Treatment Session: Pt. moving well today. Pt. requires close supervision due to decreased safety awareness during ADLs.  OT Plan: Discharge plan remains appropriate OT Frequency: Min 2X/week Follow Up Recommendations: Supervision/Assistance - 24 hour Equipment Recommended: None recommended by OT OT Goals Acute Rehab OT Goals OT Goal Formulation: With patient Time For Goal Achievement: 7 days ADL Goals Pt Will Perform Grooming: with modified independence;Standing at sink ADL Goal: Grooming - Progress: Progressing toward goals Pt Will Perform Lower Body Bathing: with modified independence;Sit to stand from chair;Sit to stand from bed;with adaptive equipment ADL Goal: Lower Body Bathing - Progress: Not met Pt Will Perform Lower Body Dressing: with modified independence;Sit to stand from chair;Sit to stand from bed;with adaptive equipment ADL Goal: Lower Body Dressing - Progress: Not met Pt Will Transfer to Toilet: with modified independence;Ambulation;3-in-1;with DME ADL Goal: Toilet Transfer - Progress: Progressing toward goals Pt Will Perform Toileting - Clothing Manipulation: with modified independence;Sitting on 3-in-1 or toilet;Standing ADL Goal: Toileting - Clothing Manipulation - Progress: Progressing toward goals Pt Will Perform Tub/Shower Transfer: Shower transfer;with modified independence;Ambulation;with DME;Shower seat with back ADL Goal: Web designer - Progress: Progressing toward goals Miscellaneous OT Goals Miscellaneous OT Goal #1: Pt will perform bed mobility with mod I in prep for EOB ADLs. OT Goal: Miscellaneous Goal #1 - Progress: Progressing toward goals  OT Treatment Precautions/Restrictions  Precautions Precautions: Knee Required Braces or Orthoses: No Restrictions Weight Bearing  Restrictions: Yes RLE Weight Bearing: Weight bearing as tolerated LLE Weight Bearing: Weight bearing as tolerated   ADL ADL Toilet Transfer: Performed;Minimal assistance Toilet Transfer Details (indicate cue type and reason): With min verbal cues for hand placement on arm rests Toilet Transfer Method: Ambulating Toilet Transfer Equipment: Raised toilet seat with arms (or 3-in-1 over toilet) Toileting - Clothing Manipulation: Performed;Set up Toileting - Clothing Manipulation Details (indicate cue type and reason): Min assist to manipulate gown up over hips while maintaining balance. Where Assessed - Toileting Clothing Manipulation: Standing Toileting - Hygiene: Performed;Modified independent Where Assessed - Toileting Hygiene: Sit on 3-in-1 or toilet Tub/Shower Transfer: Simulated;Minimal assistance Tub/Shower Transfer Details (indicate cue type and reason): Mod verbal cues for transfer technique  Tub/Shower Transfer Method: Ambulating Tub/Shower Transfer Equipment: Shower seat with back;Grab bars;Walk in shower Equipment Used: Rolling walker;Other (comment) Ambulation Related to ADLs: Pt. provided with close supervision for ambulation ~10' with RW  ADL Comments: pt. able to recall techniques for safety with shower transfer technique and pt. able to verbalize use of grab bars and RW hand placement Mobility  Bed Mobility Bed Mobility: Yes Supine to Sit: 4: Min assist Sitting - Scoot to Edge of Bed: 5: Supervision Sit to Supine: 5: Supervision;With rail     End of Session OT - End of Session Equipment Utilized During Treatment: Gait belt Activity Tolerance: Patient tolerated treatment well Patient left: in chair;with call bell in reach Nurse Communication: Mobility status for transfers General Behavior During Session: [facility] for tasks performed Cognition: [facility] for tasks performed  Marygrace Sandoval, OTR/L Pager [phone]  01/14/2012, 2:21 PM

## 2012-01-14 NOTE — Progress Notes (Signed)
Orthopedics Progress Note  Subjective: Pt doing much better today with pain to left knee and urination. Pt had foley removed early this morning.   Objective:  Filed Vitals:   01/14/12 0612  BP: 122/60  Pulse: 70  Temp: 99.2 F (37.3 C)  Resp: 20    General: Awake and alert  Musculoskeletal: left knee incision healing well, nv intact distally, no edema, no erythema or drainage, pt full weight bearing Neurovascularly intact  Lab Results  Component Value Date   WBC 10.0 01/13/2012   HGB 9.8* 01/13/2012   HCT 30.0* 01/13/2012   MCV 92.6 01/13/2012   PLT 151 01/13/2012       Component Value Date/Time   NA 136 01/11/2012 0600   K 4.1 01/11/2012 0600   CL 101 01/11/2012 0600   CO2 27 01/11/2012 0600   GLUCOSE 120* 01/11/2012 0600   BUN 11 01/11/2012 0600   CREATININE 0.99 01/11/2012 0600   CALCIUM 8.0* 01/11/2012 0600   GFRNONAA 75* 01/11/2012 0600   GFRAA 87* 01/11/2012 0600    Lab Results  Component Value Date   INR 1.20 01/14/2012   INR 1.22 01/13/2012   INR 1.19 01/12/2012    Assessment/Plan: POD #4 s/p Procedure(s):left TOTAL KNEE ARTHROPLASTY  Plan for d/c home today as long as pt is able to urinate today. Pt in agreement. F/u with Dr. Ranell Patrick in 2 weeks  Almedia Balls. Ranell Patrick, MD 01/14/2012 9:29 AM

## 2012-01-14 NOTE — Progress Notes (Signed)
Orthopedics Progress Note  Subjective: Feeling better this AM.  Rough night with the bladder.  Knee feels better.  Objective:  Filed Vitals:   01/14/12 0612  BP: 122/60  Pulse: 70  Temp: 99.2 F (37.3 C)  Resp: 20    General: Awake and alert  Musculoskeletal: Dressing CDI, NVI, no cords, neg Homan's Neurovascularly intact  Lab Results  Component Value Date   WBC 10.0 01/13/2012   HGB 9.8* 01/13/2012   HCT 30.0* 01/13/2012   MCV 92.6 01/13/2012   PLT 151 01/13/2012       Component Value Date/Time   NA 136 01/11/2012 0600   K 4.1 01/11/2012 0600   CL 101 01/11/2012 0600   CO2 27 01/11/2012 0600   GLUCOSE 120* 01/11/2012 0600   BUN 11 01/11/2012 0600   CREATININE 0.99 01/11/2012 0600   CALCIUM 8.0* 01/11/2012 0600   GFRNONAA 75* 01/11/2012 0600   GFRAA 87* 01/11/2012 0600    Lab Results  Component Value Date   INR 1.22 01/13/2012   INR 1.19 01/12/2012   INR 1.11 01/11/2012    Assessment/Plan: POD #4 s/p Procedure(s): TOTAL KNEE ARTHROPLASTY Stable orthopedically.  STill needs to void before D/C. No CPM for home Home health PT OT RN Coumadin for 30 days post op. D/C hopefully this afternoon.  Almedia Balls. Ranell Patrick, MD 01/14/2012 7:36 AM

## 2012-01-14 NOTE — Progress Notes (Signed)
Physical Therapy Treatment Patient Details Name: HAMLIN DEVINE MRN: [id number] DOB: Mar 17, 1928 Today's Date: 01/14/2012  PT Assessment/Plan  PT - Assessment/Plan Patient safe to d/c home with 24/7 supervision and home health PT. Wide RW in room for patient to take home. PT Goals  Acute Rehab PT Goals PT Goal: Supine/Side to Sit - Progress: Met PT Goal: Sit to Supine/Side - Progress: Progressing toward goal PT Goal: Sit to Stand - Progress: Progressing toward goal PT Goal: Stand to Sit - Progress: Progressing toward goal PT Transfer Goal: Bed to Chair/Chair to Bed - Progress: Progressing toward goal PT Goal: Ambulate - Progress: Progressing toward goal PT Goal: Perform Home Exercise Program - Progress: Met  PT Treatment Precautions/Restrictions  Precautions Precautions: Knee Required Braces or Orthoses: No Knee Immobilizer:  (patient no longer requires L KI for ambulation) Restrictions Weight Bearing Restrictions: Yes RLE Weight Bearing: Weight bearing as tolerated LLE Weight Bearing: Weight bearing as tolerated Mobility (including Balance) Bed Mobility Bed Mobility: Yes Supine to Sit: 5: Supervision;With rails Supine to Sit Details (indicate cue type and reason): pt heavily relied on bed rails Sitting - Scoot to Edge of Bed: 5: Supervision;With rail Sit to Supine: 5: Supervision;With rail Transfers Sit to Stand: 4: Min assist Sit to Stand Details (indicate cue type and reason): verbal cues to push up from the bed not to pull up on walker Stand to Sit: 5: Supervision Stand to Sit Details: verbal cues for hand placement Ambulation/Gait Ambulation/Gait Assistance: 4: Min assist Ambulation/Gait Assistance Details (indicate cue type and reason): patient required 1 standing rest break half way through Ambulation Distance (Feet): 300 Feet Assistive device: Rolling walker Gait Pattern: Step-through pattern Gait velocity: increased cadence compared to AM Stairs: No  Static  Standing Balance Static Standing - Comment/# of Minutes:  (pt stood x 2 min to urinate in urinal) Exercise  Total Joint Exercises Heel Slides: AROM;10 reps;Left;Supine Long Arc Quad: AROM;Left;10 reps;Seated Knee Flexion: AAROM;Left;10 reps;Seated End of Session General Behavior During Session: [facility] for tasks performed Cognition: [facility] for tasks performed  Marcene Brawn 01/14/2012, 2:47 PMj  Lewis Shock, PT, DPT Pager #: [phone] Office #: [phone]

## 2012-01-14 NOTE — Progress Notes (Signed)
Physical Therapy Treatment Patient Details Name: James [name] MRN: [id number] DOB: 1928/01/18 Today's Date: 01/14/2012  PT Assessment/Plan  PT - Assessment/Plan Comments on Treatment Session: Patient progressing well towards all goals however remains to require 24/7 supervision due to decreased safety awareness. Patient with improved anmbulation tolerance this date. PT Plan: Discharge plan remains appropriate PT Frequency: 7X/week Equipment Recommended:  (wide rolling walker) PT Goals  Acute Rehab PT Goals PT Goal: Supine/Side to Sit - Progress: Progressing toward goal PT Goal: Sit to Supine/Side - Progress: Progressing toward goal PT Goal: Sit to Stand - Progress: Progressing toward goal PT Goal: Stand to Sit - Progress: Progressing toward goal PT Transfer Goal: Bed to Chair/Chair to Bed - Progress: Progressing toward goal PT Goal: Ambulate - Progress: Progressing toward goal (remains to require contact guard) PT Goal: Perform Home Exercise Program - Progress: Met  PT Treatment Precautions/Restrictions  Precautions Precautions: Knee Required Braces or Orthoses: No Knee Immobilizer:  (patient no longer requires L KI for ambulation) Restrictions Weight Bearing Restrictions: Yes RLE Weight Bearing: Weight bearing as tolerated LLE Weight Bearing: Weight bearing as tolerated Mobility (including Balance) Bed Mobility Sit to Supine: 5: Supervision;With rail Sit to Supine - Details (indicate cue type and reason): directional verbal cues Transfers Sit to Stand: 4: Min assist Sit to Stand Details (indicate cue type and reason): contact guard assist Ambulation/Gait Ambulation/Gait Assistance: 4: Min assist (contact guard assist) Ambulation/Gait Assistance Details (indicate cue type and reason): patient with increased step then this date but remains to require 2 standing rest breaks due to onset of DOE Ambulation Distance (Feet): 200 Feet Assistive device: Rolling walker Gait Pattern:  Step-through pattern Stairs: Yes Stairs Assistance: 4: Min assist (contact guard assist) Stair Management Technique: Backwards;With walker Number of Stairs: 1  (to mimic step up to bed at home) Height of Stairs: 8   Static Standing Balance Static Standing - Balance Support: Bilateral upper extremity supported Static Standing - Level of Assistance: 5: Stand by assistance Static Standing - Comment/# of Minutes: 3 to change shorts due to urinary incontinence Exercise  Total Joint Exercises Ankle Circles/Pumps: AROM;Both;10 reps;Seated Quad Sets: AROM;Left;10 reps;Seated (with LE elevation) Short Arc Quad: AROM;10 reps;Seated Heel Slides: AAROM;Left;10 reps;Seated Long Arc Quad: PROM;10 reps;Left;Seated End of Session PT - End of Session Equipment Utilized During Treatment: Gait belt Activity Tolerance: Patient tolerated treatment well Patient left: in bed;with family/visitor present Nurse Communication:  (Rn notified that patient urinated 125 CCs) General Behavior During Session: [facility] for tasks performed Cognition: [facility] for tasks performed  Marcene Brawn 01/14/2012, 11:21 AM  Lewis Shock, PT, DPT Pager #: [phone] Office #: [phone]

## 2012-02-06 ENCOUNTER — Other Ambulatory Visit: Payer: Self-pay | Admitting: Cardiology

## 2012-02-06 MED ORDER — NIACIN-SIMVASTATIN ER 1000-20 MG PO TB24: 1.0000 | ORAL_TABLET | Freq: Every day | ORAL | Status: DC

## 2012-02-06 NOTE — Telephone Encounter (Signed)
New Refill   Patient would like refill on Simcor RX (90 day supply, renewal x3), verified pharmacy  [facility]  Patient can be reached at [phone] should there be any additional questions.

## 2012-02-17 ENCOUNTER — Other Ambulatory Visit: Payer: Self-pay | Admitting: *Deleted

## 2012-02-17 NOTE — Telephone Encounter (Signed)
Is this okay to refill? Not on medication list

## 2012-02-18 MED ORDER — BETAMETHASONE VALERATE 0.1 % EX CREA: TOPICAL_CREAM | Freq: Two times a day (BID) | CUTANEOUS | Status: DC

## 2012-02-18 NOTE — Telephone Encounter (Signed)
Sent!

## 2012-05-28 ENCOUNTER — Ambulatory Visit (INDEPENDENT_AMBULATORY_CARE_PROVIDER_SITE_OTHER): Payer: Medicare Other | Admitting: Family Medicine

## 2012-05-28 ENCOUNTER — Encounter: Payer: Self-pay | Admitting: Family Medicine

## 2012-05-28 VITALS — BP 130/64 | HR 96 | Temp 97.5°F | Wt 269.8 lb

## 2012-05-28 DIAGNOSIS — C439 Malignant melanoma of skin, unspecified: Secondary | ICD-10-CM

## 2012-05-28 DIAGNOSIS — I10 Essential (primary) hypertension: Secondary | ICD-10-CM

## 2012-05-28 DIAGNOSIS — E785 Hyperlipidemia, unspecified: Secondary | ICD-10-CM | POA: Insufficient documentation

## 2012-05-28 DIAGNOSIS — M1710 Unilateral primary osteoarthritis, unspecified knee: Secondary | ICD-10-CM

## 2012-05-28 DIAGNOSIS — D696 Thrombocytopenia, unspecified: Secondary | ICD-10-CM

## 2012-05-28 NOTE — Patient Instructions (Signed)
I would get a flu shot each fall.   Schedule a physical in 6 months.   Take care.  Glad to see you.

## 2012-05-28 NOTE — Assessment & Plan Note (Signed)
Would continue current meds.  Needs to work on weight. Discussed.

## 2012-05-28 NOTE — Assessment & Plan Note (Signed)
Doing well after surgery, needs to stay active.

## 2012-05-28 NOTE — Assessment & Plan Note (Signed)
Has yearly f/u with derm.

## 2012-05-28 NOTE — Assessment & Plan Note (Signed)
Controlled, continue current meds.  Needs to work on weight. Discussed.

## 2012-05-28 NOTE — Assessment & Plan Note (Signed)
Not depressed on post op labs.

## 2012-05-28 NOTE — Progress Notes (Signed)
Wife has CHF and multiple myeloma and was recently discharged from hospital.  Daughter lives at home and is helping.  "my cup is half full."    Hypertension:    Using medication without problems or lightheadedness: yes Chest pain with exertion:no Edema: at baseline since the knee replacement, often wears compression stockings Short of breath: at baseline  Elevated Cholesterol: Using medications without problems:yes Muscle aches: no Diet compliance: "terrible." Exercise: 'not enough' and it was discussed.  Exacerbated by caring for his wife.    Sp knee replacement and doing well.  Is s/p PT.  Able to walk well until he was relatively deconditioned while wife was in hospital.   PMH and SH reviewed  ROS: See HPI, otherwise noncontributory.  Meds, vitals, and allergies reviewed.   GEN: nad, alert and oriented, overweight HEENT: mucous membranes moist NECK: supple w/o LA CV: rrr. PULM: ctab, no inc wob ABD: soft, +bs EXT: trace edema SKIN: no acute rash L knee with well healed scar noted.

## 2012-06-22 DIAGNOSIS — C4320 Malignant melanoma of unspecified ear and external auricular canal: Secondary | ICD-10-CM | POA: Insufficient documentation

## 2012-07-08 ENCOUNTER — Other Ambulatory Visit: Payer: Self-pay | Admitting: Cardiology

## 2012-08-04 ENCOUNTER — Ambulatory Visit (INDEPENDENT_AMBULATORY_CARE_PROVIDER_SITE_OTHER): Payer: Medicare Other | Admitting: Family Medicine

## 2012-08-04 ENCOUNTER — Encounter: Payer: Self-pay | Admitting: Family Medicine

## 2012-08-04 VITALS — BP 126/66 | HR 66 | Temp 97.4°F | Wt 273.8 lb

## 2012-08-04 DIAGNOSIS — R42 Dizziness and giddiness: Secondary | ICD-10-CM

## 2012-08-04 NOTE — Patient Instructions (Signed)
Use the bed side exercise where you lean to once side and then look up.  Stay that way until the vertigo resolves and then sit up straight.  Repeat to the other side. I would get meclizine 25mg . Take 1/2 to 1 tab 3 times a day if needed.  It can make you drowsy.  This should resolve.

## 2012-08-04 NOTE — Progress Notes (Signed)
H/o vertigo, he's had episodes prev, over the years.  Sunday he noted sx with changes in position.  Room spins.  No presyncope.  Worse this AM.  Sx reproduced with head turning but not eye tracking.  Worse if rolling over in the bed, unless he is careful.  Speech wnl.  No focal neuro changes, weakness.  No FCNAVD.  Feels well except for the vertigo.    Meds, vitals, and allergies reviewed.   ROS: See HPI.  Otherwise, noncontributory.  GEN: nad, alert and oriented HEENT: mucous membranes moist, Tm wnl, OP wnl NECK: supple w/o LA CV: rrr.  PULM: ctab, no inc wob ABD: soft, +bs EXT: no edema SKIN: no acute rash CN 2-12 wnl B, S/S/DTR wnl x4 Sx reproduced with head turning.

## 2012-08-04 NOTE — Assessment & Plan Note (Signed)
Reassuring exam, likely BPV.  Would use meclizine and home exercises.  Anatomy d/w pt. He agrees with plan.

## 2012-08-20 ENCOUNTER — Encounter: Payer: Self-pay | Admitting: Gastroenterology

## 2012-09-07 ENCOUNTER — Ambulatory Visit (INDEPENDENT_AMBULATORY_CARE_PROVIDER_SITE_OTHER): Payer: Medicare Other | Admitting: Cardiology

## 2012-09-07 ENCOUNTER — Encounter: Payer: Self-pay | Admitting: Cardiology

## 2012-09-07 VITALS — BP 129/61 | HR 68 | Ht 71.0 in | Wt 274.0 lb

## 2012-09-07 DIAGNOSIS — R0609 Other forms of dyspnea: Secondary | ICD-10-CM

## 2012-09-07 DIAGNOSIS — R0989 Other specified symptoms and signs involving the circulatory and respiratory systems: Secondary | ICD-10-CM

## 2012-09-07 DIAGNOSIS — R609 Edema, unspecified: Secondary | ICD-10-CM

## 2012-09-07 DIAGNOSIS — I2510 Atherosclerotic heart disease of native coronary artery without angina pectoris: Secondary | ICD-10-CM

## 2012-09-07 NOTE — Patient Instructions (Addendum)
Your physician wants you to follow-up in: 1 year.   You will receive a reminder letter in the mail two months in advance. If you don't receive a letter, please call our office to schedule the follow-up appointment.  Your physician has requested that you have a carotid duplex in December. This test is an ultrasound of the carotid arteries in your neck. It looks at blood flow through these arteries that supply the brain with blood. Allow one hour for this exam. There are no restrictions or special instructions.

## 2012-09-07 NOTE — Assessment & Plan Note (Signed)
Arrange carotid Dopplers in December. Continue secondary preventative therapy.

## 2012-09-07 NOTE — Progress Notes (Signed)
HPI Mr Bouse returns today for his history of coronary artery disease, right carotid bruit which is asymptomatic, history of hypertension, hyperlipidemia.   He denies any angina or chest discomfort. He has chronic dyspnea on exertion. He denies any symptoms of TIAs or mini strokes. He has no peripheral claudication. His edema has been stable.   He is compliant with his meds.  Past Medical History  Diagnosis Date  . Bradycardia     Hypertensive  . carotid bruit, right   . hypercholesterolemia     Trig 234/293;takes Simcor daily  . Obesity, morbid   . Vasculitis   . Immune thrombocytopenic purpura     Dr. Cyndie Chime  . Dermatitis     legs  . Blood in stool   . Bleeding gums   . Chronic low back pain   . Myalgia and myositis   . Unspecified vitamin D deficiency   . Bladder diverticulum   . Primary osteoarthritis of both knees   . Anxiety   . Nephrolithiasis   . History of elevated glucose   . Thrombophlebitis     right forearm  . History of BPH   . History of colonic polyps     [name]  . Epididymitis, left     S/P Epidimectomy  . Pneumonia, bacterial     RML resolved, spirometry, normal  . History of ITP 2011    per Dr. Cyndie Chime 2011, normal platelets  . Thrombocytopenia 05/20-05/27/10    Hosp ITP severe, Dr. Cyndie Chime  . Splenomegaly 05/15/09    abd U/S NML  . Hypertension, benign     takes Micardis daily  . Sleep apnea     sitting/lying/exertion  . Neuropathy     acute  . Arthritis     hands  . Bruises easily   . H/O hiatal hernia   . GERD (gastroesophageal reflux disease)     hx of but doesn't take any meds now  . Urinary frequency   . Urinary urgency   . Nocturia   . Urinary leakage   . Malignant melanoma of ear     right    Current Outpatient Prescriptions  Medication Sig Dispense Refill  . aspirin 81 MG tablet Take 81 mg by mouth every evening.       . Cholecalciferol (VITAMIN D3) 2000 UNITS capsule Take 2,000 Units by mouth daily.          . furosemide (LASIX) 40 MG tablet TAKE 2 TABLETS BY MOUTH EVERY DAY  180 tablet  1  . Misc Natural Products (OSTEO BI-FLEX JOINT SHIELD PO) Take 2 tablets by mouth daily.       . Multiple Vitamin (MULTIVITAMIN) tablet Take 1 tablet by mouth daily.        . niacin-simvastatin (SIMCOR) 1000-20 MG 24 hr tablet Take 1 tablet by mouth at bedtime.  90 tablet  3  . nitroGLYCERIN (NITROSTAT) 0.4 MG SL tablet Place 0.4 mg under the tongue every 5 (five) minutes as needed. For chest pain      . PSYLLIUM PO Take 20 mLs by mouth every evening. Powder. 4 teaspoonfuls every evening       . telmisartan (MICARDIS) 20 MG tablet Take 1 tablet (20 mg total) by mouth daily.  90 tablet  3    Allergies  Allergen Reactions  . Penicillins     REACTION: Rash, swelling  . Rofecoxib   . Sulfamethoxazole W-Trimethoprim     REACTION: questional onset of low platelets and bleeding  .  Tramadol Hcl     REACTION: dizziness, drowsiness  . Neosporin (Neomycin-Polymyxin-Gramicidin) Rash    Family History  Problem Relation Age of Onset  . Cancer Mother     thyroid  . Diabetes Father   . Heart disease Father     MI  . Stroke Brother     cerebral hemm  . Cancer Other     colon, ? brother  . Anesthesia problems Neg Hx   . Hypotension Neg Hx   . Malignant hyperthermia Neg Hx   . Pseudochol deficiency Neg Hx   . Prostate cancer Neg Hx   . Colon cancer Brother     History   Social History  . Marital Status: Married    Spouse Name: N/A    Number of Children: 2  . Years of Education: N/A   Occupational History  . Retired     1996 [employer]   Social History Main Topics  . Smoking status: Former Smoker -- 2.0 packs/day for 20 years    Quit date: 09/27/1967  . Smokeless tobacco: Never Used   Comment: Quit in 1968  . Alcohol Use: 0.0 oz/week     occasionally with dinner  . Drug Use: No  . Sexually Active: No   Other Topics Concern  . Not on file   Social History Narrative   Married  1950 and lives with wifeChildren-2.  1 lives at homePatient does not get regular exerciseNo caffeineRetired from [employer] (E[employer]), retired from [employer] had CHF and multiple myeloma    ROS ALL NEGATIVE EXCEPT THOSE NOTED IN HPI  PE  General Appearance: well developed, well nourished in no acute distress, skin color looks good, overweight HEENT: symmetrical face, PERRLA, good dentition  Neck: no JVD, thyromegaly, or adenopathy, trachea midline Chest: symmetric without deformity Cardiac: PMI non-displaced, RRR, normal S1, S2, no gallop or murmur Lung: clear to ausculation and percussion Vascular: all pulses full, right carotid bruit  Abdominal: nondistended, nontender, good bowel sounds, no HSM, no bruits Extremities: no cyanosis, clubbing or edema, no sign of DVT, no varicosities  Skin: normal color, no rashes Neuro: alert and oriented x 3, non-focal Pysch: normal affect  EKG Not repeated. BMET    Component Value Date/Time   NA 136 01/11/2012 0600   K 4.1 01/11/2012 0600   CL 101 01/11/2012 0600   CO2 27 01/11/2012 0600   GLUCOSE 120* 01/11/2012 0600   BUN 11 01/11/2012 0600   CREATININE 0.99 01/11/2012 0600   CALCIUM 8.0* 01/11/2012 0600   GFRNONAA 75* 01/11/2012 0600   GFRAA 87* 01/11/2012 0600    Lipid Panel     Component Value Date/Time   CHOL 140 11/25/2011 0848   TRIG 116.0 11/25/2011 0848   HDL 54.00 11/25/2011 0848   CHOLHDL 3 11/25/2011 0848   VLDL 23.2 11/25/2011 0848   LDLCALC 63 11/25/2011 0848    CBC    Component Value Date/Time   WBC 10.0 01/13/2012 0645   WBC 8.2 10/21/2011 1329   RBC 3.24* 01/13/2012 0645   RBC 4.43 10/21/2011 1329   HGB 9.8* 01/13/2012 0645   HGB 13.5 10/21/2011 1329   HCT 30.0* 01/13/2012 0645   HCT 41.0 10/21/2011 1329   PLT 151 01/13/2012 0645   PLT 229 10/21/2011 1329   MCV 92.6 01/13/2012 0645   MCV 92.6 10/21/2011 1329   MCH 30.2 01/13/2012 0645   MCH 30.6 10/21/2011 1329   MCHC 32.7 01/13/2012 0645   MCHC  33.0 10/21/2011 1329    RDW 14.7 01/13/2012 0645   RDW 14.5 10/21/2011 1329   LYMPHSABS 2.3 10/21/2011 1329   LYMPHSABS 2.1 05/27/2011 0823   MONOABS 1.0* 10/21/2011 1329   MONOABS 0.7 05/27/2011 0823   EOSABS 0.1 10/21/2011 1329   EOSABS 0.1 05/27/2011 0823   BASOSABS 0.0 10/21/2011 1329   BASOSABS 0.0 05/27/2011 1610

## 2012-09-07 NOTE — Assessment & Plan Note (Signed)
Stable. Continue secondary preventative therapy. Return the office in one year. 

## 2012-09-09 ENCOUNTER — Ambulatory Visit (INDEPENDENT_AMBULATORY_CARE_PROVIDER_SITE_OTHER): Payer: Medicare Other

## 2012-09-09 DIAGNOSIS — Z23 Encounter for immunization: Secondary | ICD-10-CM

## 2012-09-15 ENCOUNTER — Encounter: Payer: Self-pay | Admitting: Family Medicine

## 2012-09-15 DIAGNOSIS — R3915 Urgency of urination: Secondary | ICD-10-CM | POA: Insufficient documentation

## 2012-10-19 ENCOUNTER — Encounter: Payer: Self-pay | Admitting: Family Medicine

## 2012-10-19 DIAGNOSIS — G629 Polyneuropathy, unspecified: Secondary | ICD-10-CM | POA: Insufficient documentation

## 2012-10-23 ENCOUNTER — Ambulatory Visit: Payer: Medicare Other

## 2012-11-08 ENCOUNTER — Encounter: Payer: Self-pay | Admitting: Family Medicine

## 2012-11-08 DIAGNOSIS — Z8739 Personal history of other diseases of the musculoskeletal system and connective tissue: Secondary | ICD-10-CM | POA: Insufficient documentation

## 2012-11-15 ENCOUNTER — Other Ambulatory Visit: Payer: Self-pay | Admitting: Family Medicine

## 2012-11-15 DIAGNOSIS — D696 Thrombocytopenia, unspecified: Secondary | ICD-10-CM

## 2012-11-15 DIAGNOSIS — D691 Qualitative platelet defects: Secondary | ICD-10-CM

## 2012-11-15 DIAGNOSIS — E559 Vitamin D deficiency, unspecified: Secondary | ICD-10-CM

## 2012-11-15 DIAGNOSIS — E789 Disorder of lipoprotein metabolism, unspecified: Secondary | ICD-10-CM

## 2012-11-15 DIAGNOSIS — M109 Gout, unspecified: Secondary | ICD-10-CM

## 2012-11-15 DIAGNOSIS — E7800 Pure hypercholesterolemia, unspecified: Secondary | ICD-10-CM

## 2012-11-23 ENCOUNTER — Other Ambulatory Visit (INDEPENDENT_AMBULATORY_CARE_PROVIDER_SITE_OTHER): Payer: Medicare Other

## 2012-11-23 DIAGNOSIS — M109 Gout, unspecified: Secondary | ICD-10-CM

## 2012-11-23 DIAGNOSIS — E559 Vitamin D deficiency, unspecified: Secondary | ICD-10-CM

## 2012-11-23 DIAGNOSIS — D696 Thrombocytopenia, unspecified: Secondary | ICD-10-CM

## 2012-11-23 DIAGNOSIS — E7800 Pure hypercholesterolemia, unspecified: Secondary | ICD-10-CM

## 2012-11-23 LAB — CBC WITH DIFFERENTIAL/PLATELET
Basophils Absolute: 0 10*3/uL (ref 0.0–0.1)
Basophils Relative: 0.5 % (ref 0.0–3.0)
Eosinophils Absolute: 0.1 10*3/uL (ref 0.0–0.7)
Lymphocytes Relative: 29.4 % (ref 12.0–46.0)
MCHC: 32.7 g/dL (ref 30.0–36.0)
Monocytes Absolute: 0.7 10*3/uL (ref 0.1–1.0)
Neutrophils Relative %: 58.1 % (ref 43.0–77.0)
Platelets: 256 10*3/uL (ref 150.0–400.0)
RBC: 4.32 Mil/uL (ref 4.22–5.81)
RDW: 15.1 % — ABNORMAL HIGH (ref 11.5–14.6)

## 2012-11-23 LAB — COMPREHENSIVE METABOLIC PANEL
ALT: 22 U/L (ref 0–53)
AST: 25 U/L (ref 0–37)
Albumin: 3.6 g/dL (ref 3.5–5.2)
Alkaline Phosphatase: 69 U/L (ref 39–117)
Calcium: 9 mg/dL (ref 8.4–10.5)
Chloride: 104 mEq/L (ref 96–112)
Potassium: 4.2 mEq/L (ref 3.5–5.1)
Sodium: 140 mEq/L (ref 135–145)
Total Protein: 6.7 g/dL (ref 6.0–8.3)

## 2012-11-23 LAB — LIPID PANEL
HDL: 48.8 mg/dL (ref >39.00)
Triglycerides: 220 mg/dL — ABNORMAL HIGH (ref 0.0–149.0)
VLDL: 44 mg/dL — ABNORMAL HIGH (ref 0.0–40.0)

## 2012-11-27 ENCOUNTER — Encounter: Payer: Self-pay | Admitting: Family Medicine

## 2012-11-27 ENCOUNTER — Ambulatory Visit (INDEPENDENT_AMBULATORY_CARE_PROVIDER_SITE_OTHER): Payer: Medicare Other | Admitting: Family Medicine

## 2012-11-27 VITALS — BP 136/64 | HR 73 | Temp 97.4°F | Ht 72.5 in | Wt 280.0 lb

## 2012-11-27 DIAGNOSIS — R739 Hyperglycemia, unspecified: Secondary | ICD-10-CM

## 2012-11-27 DIAGNOSIS — Z0000 Encounter for general adult medical examination without abnormal findings: Secondary | ICD-10-CM

## 2012-11-27 DIAGNOSIS — E785 Hyperlipidemia, unspecified: Secondary | ICD-10-CM

## 2012-11-27 DIAGNOSIS — I10 Essential (primary) hypertension: Secondary | ICD-10-CM

## 2012-11-27 DIAGNOSIS — R7309 Other abnormal glucose: Secondary | ICD-10-CM

## 2012-11-27 DIAGNOSIS — M109 Gout, unspecified: Secondary | ICD-10-CM

## 2012-11-27 DIAGNOSIS — R42 Dizziness and giddiness: Secondary | ICD-10-CM

## 2012-11-27 MED ORDER — ALLOPURINOL 100 MG PO TABS: ORAL_TABLET | ORAL | Status: DC

## 2012-11-27 NOTE — Progress Notes (Signed)
I have personally reviewed the Medicare Annual Wellness questionnaire and have noted 1. The patient's medical and social history 2. Their use of alcohol, tobacco or illicit drugs 3. Their current medications and supplements 4. The patient's functional ability including ADL's, fall risks, home safety risks and hearing or visual             impairment. 5. Diet and physical activities 6. Evidence for depression or mood disorders  The patients weight, height, BMI have been recorded in the chart and visual acuity is per eye clinic.  I have made referrals, counseling and provided education to the patient based review of the above and I have provided the pt with a written personalized care plan for preventive services.  See scanned forms.  Routine anticipatory guidance given to patient.  See health maintenance. Flu 2013 Shingles 2010 PNA 2006 Tetanus 2006 Colonoscopy 2010 Prostate cancer screening d/w pt and not indicated.  Advance directive d/w pt.  Daughter Rinaldo Cloud is designated.  Cognitive function addressed- see scanned forms- and if abnormal then additional documentation follows.   Gout.  Not on proph meds.  He had d/w hand center.  Uric acid is up.  See plan.  No acute flare now.   Vertigo this AM, resolved now.  This is episodic, irregular.  Positional, with rolling over in bed.  Not orthostatic.    Hypertension:    Using medication without problems or lightheadedness: yes Chest pain with exertion:no Edema: controlled with meds.  Short of breath: "I was born short of breath" but not worse recently.  Is at baseline  Wife died and since then his weight is up due to diet changes.  Discussed, esp re: sugar.  Elevated Cholesterol: Using medications without problems:yes Muscle aches: no Diet compliance: see above.  Exercise: limitied  PMH and SH reviewed  Meds, vitals, and allergies reviewed.   ROS: See HPI.  Otherwise negative.    GEN: nad, alert and oriented HEENT: mucous  membranes moist NECK: supple w/o LA CV: rrr. PULM: ctab, no inc wob ABD: soft, +bs EXT: no edema SKIN: no acute rash

## 2012-11-27 NOTE — Patient Instructions (Addendum)
Recheck nonfasting labs in 1 month.  Take care.  Try to avoid sweets in the meantime.  Start the allopurinol.

## 2012-11-29 ENCOUNTER — Encounter: Payer: Self-pay | Admitting: Family Medicine

## 2012-11-29 DIAGNOSIS — Z0000 Encounter for general adult medical examination without abnormal findings: Secondary | ICD-10-CM | POA: Insufficient documentation

## 2012-11-29 DIAGNOSIS — R739 Hyperglycemia, unspecified: Secondary | ICD-10-CM | POA: Insufficient documentation

## 2012-11-29 NOTE — Assessment & Plan Note (Signed)
D/w pt about weight loss and labs. This is compounded by social situation and I offered my support. Recheck A1c in about 1 month.  

## 2012-11-29 NOTE — Assessment & Plan Note (Signed)
D/w pt about weight loss and labs. This is compounded by social situation and I offered my support. Recheck A1c in about 1 month.

## 2012-11-29 NOTE — Assessment & Plan Note (Signed)
Inc in TG noted, d/w pt about diet and exercise.

## 2012-11-29 NOTE — Assessment & Plan Note (Signed)
See scanned forms.  Routine anticipatory guidance given to patient.  See health maintenance. Flu 2013 Shingles 2010 PNA 2006 Tetanus 2006 Colonoscopy 2010 Prostate cancer screening d/w pt and not indicated.  Advance directive d/w pt.  Daughter Rinaldo Cloud is designated.  Cognitive function addressed- see scanned forms- and if abnormal then additional documentation follows.

## 2012-11-29 NOTE — Assessment & Plan Note (Signed)
Controlled, d/w pt about labs and weight. Continue current meds.

## 2012-11-29 NOTE — Assessment & Plan Note (Signed)
Inc allopurinol gradually and recheck uric acid in about 1 months.  Pt is aware that starting allopurinol can cause gout flare.  Handout given re: diet for gout.

## 2012-11-29 NOTE — Assessment & Plan Note (Signed)
Episodic sx.  Resolved as of the OV.

## 2012-12-07 ENCOUNTER — Encounter (INDEPENDENT_AMBULATORY_CARE_PROVIDER_SITE_OTHER): Payer: Medicare Other

## 2012-12-07 DIAGNOSIS — R0989 Other specified symptoms and signs involving the circulatory and respiratory systems: Secondary | ICD-10-CM

## 2012-12-07 DIAGNOSIS — R42 Dizziness and giddiness: Secondary | ICD-10-CM

## 2012-12-07 DIAGNOSIS — I6529 Occlusion and stenosis of unspecified carotid artery: Secondary | ICD-10-CM

## 2012-12-08 ENCOUNTER — Other Ambulatory Visit: Payer: Self-pay | Admitting: *Deleted

## 2012-12-08 ENCOUNTER — Ambulatory Visit (INDEPENDENT_AMBULATORY_CARE_PROVIDER_SITE_OTHER): Payer: Medicare Other | Admitting: Family Medicine

## 2012-12-08 ENCOUNTER — Telehealth: Payer: Self-pay | Admitting: Family Medicine

## 2012-12-08 ENCOUNTER — Encounter: Payer: Self-pay | Admitting: Family Medicine

## 2012-12-08 VITALS — BP 130/56 | HR 70 | Temp 97.5°F | Wt 277.0 lb

## 2012-12-08 DIAGNOSIS — H6120 Impacted cerumen, unspecified ear: Secondary | ICD-10-CM

## 2012-12-08 MED ORDER — TELMISARTAN 20 MG PO TABS: 20.0000 mg | ORAL_TABLET | Freq: Every day | ORAL | Status: DC

## 2012-12-08 NOTE — Progress Notes (Signed)
Pt had been seen by neuro for dizziness and needed ear wax removed to see if that would help.  No ear pain, no sig change in hearing.    Meds, vitals, and allergies reviewed.   ROS: See HPI.  Otherwise, noncontributory.  nad ncat B cerumen impaction resolved with irrigation.  TMs and canal wnl on recheck  rhinne and weber testing wnl

## 2012-12-08 NOTE — Telephone Encounter (Signed)
Patient Information:  Caller Name: [place]  Phone: [phone]  Patient: James [name]  Gender: Male  DOB: 08/11/1928  Age: 78 Years  PCP: Crawford Givens Clelia Croft) [facility])  Office Follow Up:  Does the office need to follow up with this patient?: No  Instructions For The Office: N/A  RN Note:  Patient states he was seen by the Neurologist on yesterday for dizziness and he told him his right ear was impacted and some wax in the left ear. He recommended him contact PCP to get ears cleaned. Patient would like to come today.  Symptoms  Reason For Call & Symptoms: ears are impacted per Neurologist and he recommends he get ears cleaned.  Gets dizzy when he gets up in the morning.  Reviewed Health History In EMR: Yes  Reviewed Medications In EMR: Yes  Reviewed Allergies In EMR: Yes  Reviewed Surgeries / Procedures: Yes  Date of Onset of Symptoms: 11/17/2012  Treatments Tried: Meclizine ordered by Neurologist just started last night , OTC ear wash  Treatments Tried Worked: No  Guideline(s) Used:  Ear - Congestion  Dizziness  Disposition Per Guideline:   See Within 2 Weeks in Office  Reason For Disposition Reached:   Dizziness not present now, but is a chronic symptom (recurrent or ongoing AND lasting > 4 weeks)  Advice Given:  Drink Fluids:  Drink several glasses of fruit juice, other clear fluids, or water. This will improve hydration.  Stand Up Slowly:  In the mornings, sit up for a few minutes before you stand up. That will help your blood flow make the adjustment.  Call Back If:  You become worse.  Appointment Scheduled:  12/08/2012 16:15:00 Appointment Scheduled Provider:  Crawford Givens Clelia Croft) [facility])

## 2012-12-08 NOTE — Patient Instructions (Addendum)
Use the meclizine as instructed per neuro and let the neuro clinic know if you don't improve.  I hope you have a good trip.  Take care.

## 2012-12-08 NOTE — Progress Notes (Signed)
Patient says he gave the name of the medication that the Neurologist gave him to the nurse when he called in and she said she added it to his meds list.  I don't see a new medication added, however, phone note states Neurologist prescribed Meclizine.  Patient does not remember the name of the medication.  Ninetta Lights, CMA

## 2012-12-09 DIAGNOSIS — H6120 Impacted cerumen, unspecified ear: Secondary | ICD-10-CM | POA: Insufficient documentation

## 2012-12-09 NOTE — Assessment & Plan Note (Signed)
Resolved.  No complications.  If vertigo continues, he'll f/u with neuro.

## 2012-12-12 ENCOUNTER — Encounter: Payer: Self-pay | Admitting: Family Medicine

## 2012-12-28 ENCOUNTER — Other Ambulatory Visit: Payer: Self-pay | Admitting: *Deleted

## 2012-12-28 MED ORDER — NIACIN-SIMVASTATIN ER 1000-20 MG PO TB24: 1.0000 | ORAL_TABLET | Freq: Every day | ORAL | Status: DC

## 2012-12-30 ENCOUNTER — Other Ambulatory Visit: Payer: Self-pay | Admitting: Neurology

## 2012-12-30 DIAGNOSIS — R42 Dizziness and giddiness: Secondary | ICD-10-CM

## 2012-12-31 ENCOUNTER — Other Ambulatory Visit: Payer: Self-pay | Admitting: *Deleted

## 2012-12-31 MED ORDER — FUROSEMIDE 40 MG PO TABS: 80.0000 mg | ORAL_TABLET | Freq: Every day | ORAL | Status: DC

## 2013-01-01 ENCOUNTER — Other Ambulatory Visit: Payer: Medicare Other

## 2013-01-05 ENCOUNTER — Other Ambulatory Visit: Payer: Medicare Other

## 2013-01-06 ENCOUNTER — Ambulatory Visit
Admission: RE | Admit: 2013-01-06 | Discharge: 2013-01-06 | Disposition: A | Discharge status: Home / Self Care | Payer: Medicare Other | Source: Ambulatory Visit | Attending: Neurology | Admitting: Neurology

## 2013-01-06 DIAGNOSIS — R42 Dizziness and giddiness: Secondary | ICD-10-CM

## 2013-01-11 ENCOUNTER — Other Ambulatory Visit (INDEPENDENT_AMBULATORY_CARE_PROVIDER_SITE_OTHER): Payer: Medicare Other

## 2013-01-11 ENCOUNTER — Other Ambulatory Visit: Payer: Self-pay | Admitting: *Deleted

## 2013-01-11 DIAGNOSIS — M109 Gout, unspecified: Secondary | ICD-10-CM

## 2013-01-11 DIAGNOSIS — R7309 Other abnormal glucose: Secondary | ICD-10-CM

## 2013-01-11 DIAGNOSIS — R739 Hyperglycemia, unspecified: Secondary | ICD-10-CM

## 2013-01-11 LAB — URIC ACID: Uric Acid, Serum: 6.2 mg/dL (ref 4.0–7.8)

## 2013-01-11 MED ORDER — ALLOPURINOL 300 MG PO TABS: 300.0000 mg | ORAL_TABLET | Freq: Every day | ORAL | Status: DC

## 2013-01-15 ENCOUNTER — Ambulatory Visit: Payer: Medicare Other | Attending: Neurology | Admitting: Physical Therapy

## 2013-01-15 DIAGNOSIS — R269 Unspecified abnormalities of gait and mobility: Secondary | ICD-10-CM | POA: Insufficient documentation

## 2013-01-15 DIAGNOSIS — IMO0001 Reserved for inherently not codable concepts without codable children: Secondary | ICD-10-CM | POA: Insufficient documentation

## 2013-01-15 DIAGNOSIS — H8110 Benign paroxysmal vertigo, unspecified ear: Secondary | ICD-10-CM | POA: Insufficient documentation

## 2013-01-19 ENCOUNTER — Ambulatory Visit: Payer: Medicare Other | Admitting: Physical Therapy

## 2013-01-22 ENCOUNTER — Ambulatory Visit: Payer: Medicare Other | Admitting: Physical Therapy

## 2013-01-26 ENCOUNTER — Telehealth: Payer: Self-pay

## 2013-01-26 ENCOUNTER — Ambulatory Visit: Payer: Medicare Other | Attending: Neurology | Admitting: Physical Therapy

## 2013-01-26 DIAGNOSIS — H8110 Benign paroxysmal vertigo, unspecified ear: Secondary | ICD-10-CM | POA: Insufficient documentation

## 2013-01-26 DIAGNOSIS — IMO0001 Reserved for inherently not codable concepts without codable children: Secondary | ICD-10-CM | POA: Insufficient documentation

## 2013-01-26 DIAGNOSIS — R269 Unspecified abnormalities of gait and mobility: Secondary | ICD-10-CM | POA: Insufficient documentation

## 2013-01-26 NOTE — Telephone Encounter (Signed)
It would be reasonable to try OTC claritin/loratadine 10mg  a day.  That would be the least likely to cause side effects and may help some.  Thanks.

## 2013-01-26 NOTE — Telephone Encounter (Signed)
Patient advised.

## 2013-01-26 NOTE — Telephone Encounter (Signed)
Pt left v/m that he is presently taking neurological rehab & PT per Dr Anne Hahn for unstableness and  Degree of unstableness varies. Physical therapist told pt he might have some congestion and suggested pt taking antihistamine. Pt will not take med without Dr Lianne Bushy advise due to hypertension;pt wants to know if OK to take antihistamine and if so which one.Please advise.[facility].

## 2013-01-29 ENCOUNTER — Ambulatory Visit: Payer: Medicare Other | Admitting: Physical Therapy

## 2013-02-01 ENCOUNTER — Ambulatory Visit: Payer: Medicare Other | Admitting: Physical Therapy

## 2013-02-02 ENCOUNTER — Encounter: Payer: Self-pay | Admitting: Neurology

## 2013-02-04 ENCOUNTER — Ambulatory Visit: Payer: Medicare Other | Admitting: Physical Therapy

## 2013-02-08 ENCOUNTER — Ambulatory Visit: Payer: Medicare Other | Admitting: Physical Therapy

## 2013-02-11 ENCOUNTER — Ambulatory Visit: Payer: Medicare Other | Admitting: Physical Therapy

## 2013-02-15 ENCOUNTER — Ambulatory Visit: Payer: Medicare Other | Admitting: Physical Therapy

## 2013-02-18 ENCOUNTER — Ambulatory Visit: Payer: Medicare Other | Admitting: Physical Therapy

## 2013-03-11 ENCOUNTER — Telehealth: Payer: Self-pay | Admitting: Cardiology

## 2013-03-11 NOTE — Telephone Encounter (Signed)
Walk In pt Form " pt Dropped Of Envelope With Refills" Sent to [facility] Corwin 03/11/13/KM

## 2013-03-12 ENCOUNTER — Telehealth: Payer: Self-pay | Admitting: *Deleted

## 2013-03-12 MED ORDER — PITAVASTATIN CALCIUM 4 MG PO TABS: 4.0000 mg | ORAL_TABLET | Freq: Every day | ORAL | Status: DC

## 2013-03-12 MED ORDER — NIACIN ER (ANTIHYPERLIPIDEMIC) 1000 MG PO TBCR: 1000.0000 mg | EXTENDED_RELEASE_TABLET | Freq: Every day | ORAL | Status: DC

## 2013-03-12 NOTE — Telephone Encounter (Signed)
I spoke with [name] and she is aware we received the letter from [facility] about changing his Simcor to Niaspan & Pitavastatin  Dr. Daleen Squibb reviewed this on 03/11/13 and agreed with change Prescriptions e-scribed today Mylo Red RN

## 2013-03-19 ENCOUNTER — Encounter: Payer: Self-pay | Admitting: Neurology

## 2013-03-19 ENCOUNTER — Ambulatory Visit (INDEPENDENT_AMBULATORY_CARE_PROVIDER_SITE_OTHER): Payer: Medicare Other | Admitting: Neurology

## 2013-03-19 VITALS — BP 126/56 | HR 76 | Ht 71.0 in | Wt 275.0 lb

## 2013-03-19 DIAGNOSIS — G609 Hereditary and idiopathic neuropathy, unspecified: Secondary | ICD-10-CM

## 2013-03-19 DIAGNOSIS — R269 Unspecified abnormalities of gait and mobility: Secondary | ICD-10-CM | POA: Insufficient documentation

## 2013-03-19 DIAGNOSIS — R42 Dizziness and giddiness: Secondary | ICD-10-CM

## 2013-03-19 DIAGNOSIS — G629 Polyneuropathy, unspecified: Secondary | ICD-10-CM

## 2013-03-19 HISTORY — DX: Unspecified abnormalities of gait and mobility: R26.9

## 2013-03-19 NOTE — Progress Notes (Signed)
Reason for visit: Vertigo  James [name] is an 79 y.o. male  History of present illness:   James [name] is an 79 year old left-handed white male with a history of obesity, vertigo, and a peripheral neuropathy. The patient has significant numbness in the feet at this point, and he has problems with gait instability secondary to this. The patient uses a cane for ambulation, and he will stumble on occasion, but he has not fallen. The patient indicates that he still operates a motor vehicle, and he has no concerns about safety issues with feeling the accelerator or brake pedal. The patient has undergone some physical therapy which has helped his balance. His sensation of vertigo and cloudiness of cognition have improved. The patient will still have some episodes of this, however. The patient has undergone MRI evaluation the brain showing minimal small vessel disease, unchanged from a study done in 2010. The patient also had MRI angiogram evaluation of the brain, and this showed a moderate level of atherosclerosis. The patient is on low-dose aspirin, and cholesterol lowering agents. The patient returns for an evaluation. The patient indicates that he does not have significant burning or stinging in the feet that keep him awake at night.  Past Medical History  Diagnosis Date  . Bradycardia     Hypertensive  . carotid bruit, right   . hypercholesterolemia     Trig 234/293;takes Simcor daily  . Obesity, morbid   . Vasculitis   . Immune thrombocytopenic purpura     Dr. Cyndie Chime  . Dermatitis     legs  . Blood in stool   . Bleeding gums   . Chronic low back pain   . Myalgia and myositis   . Unspecified vitamin D deficiency   . Bladder diverticulum   . Primary osteoarthritis of both knees   . Anxiety   . Nephrolithiasis   . History of elevated glucose   . Thrombophlebitis     right forearm  . History of BPH   . History of colonic polyps     [name]  . Epididymitis, left     S/P  Epidimectomy  . Pneumonia, bacterial     RML resolved, spirometry, normal  . History of ITP 2011    per Dr. Cyndie Chime 2011, normal platelets  . Thrombocytopenia 05/20-05/27/10    Hosp ITP severe, Dr. Cyndie Chime  . Splenomegaly 05/15/09    abd U/S NML  . Hypertension, benign     takes Micardis daily  . Sleep apnea     sitting/lying/exertion  . Neuropathy     acute  . Arthritis     hands  . Bruises easily   . H/O hiatal hernia   . GERD (gastroesophageal reflux disease)     hx of but doesn't take any meds now  . Urinary frequency   . Urinary urgency   . Nocturia   . Urinary leakage   . Malignant melanoma of ear     right  . Abnormality of gait 03/19/2013    Past Surgical History  Procedure Laterality Date  . Kidney stone retrieval  1950's  . Fem cutaneous nerve entrapment  1977    due to surgery (mild lat anesth of bilat legs)  . Herniorraphy      x 2 right  . Limited pelvis/hip bone scan  04/99    negative  . Bone scan  04/00  . Cataract extraction  02/04    right  . Cataract extraction  02/04  righ  . Wide local excision, right ear  06/04    melanoma with flap  . Stress cardiolite  05/11/04    mild inf. ischemia, EF 71%  . Left shoulder surgery  06/17/2005  . Bronchoscopy  09/08    RML collapse with chronic pneumonia, bx neg (Dr. Delford Field)  . Right middle finger cyst excision  03/07/09    DIP Joint Mucoid (Dr. Merlyn Lot)  . Eyelid & eyebrow lift  1996  . Right middle finger cyst reexcision  08/15/09    DIP Joint Mucoid Cyst (Dr. Merlyn Lot)  . Appendectomy  1950  . Hernia repair  1959    x 2  . Excision of mucoid tumor    . Cardiac catheterization  05/23/04    mild plague-statin  . Colonoscopy    . Total knee arthroplasty  01/10/2012    Procedure: TOTAL KNEE ARTHROPLASTY;  Surgeon: Verlee Rossetti, MD;  Location: [facility] OR;  Service: Orthopedics;  Laterality: Left;  . Testicle mass      removal of mass, not removal of testicle, noncancerous    Family History   Problem Relation Age of Onset  . Cancer Mother     thyroid  . Diabetes Father   . Heart disease Father     MI  . Stroke Brother     cerebral hemm  . Cancer Other     colon, ? brother  . Anesthesia problems Neg Hx   . Hypotension Neg Hx   . Malignant hyperthermia Neg Hx   . Pseudochol deficiency Neg Hx   . Prostate cancer Neg Hx   . Colon cancer Brother     Social history:  reports that he quit smoking about 45 years ago. He has never used smokeless tobacco. He reports that  drinks alcohol. He reports that he does not use illicit drugs.  Allergies:  Allergies  Allergen Reactions  . Penicillins     REACTION: Rash, swelling  . Rofecoxib   . Sulfamethoxazole W-Trimethoprim     REACTION: questional onset of low platelets and bleeding  . Tramadol Hcl     REACTION: dizziness, drowsiness  . Neosporin (Neomycin-Polymyxin-Gramicidin) Rash    Medications:  Current Outpatient Prescriptions on File Prior to Visit  Medication Sig Dispense Refill  . allopurinol (ZYLOPRIM) 300 MG tablet Take 1 tablet (300 mg total) by mouth daily.  90 tablet  3  . aspirin 81 MG tablet Take 81 mg by mouth every evening.       . Cholecalciferol (VITAMIN D3) 2000 UNITS capsule Take 2,000 Units by mouth daily.        . furosemide (LASIX) 40 MG tablet Take 2 tablets (80 mg total) by mouth daily.  180 tablet  1  . Misc Natural Products (OSTEO BI-FLEX JOINT SHIELD PO) Take 2 tablets by mouth daily.       . Multiple Vitamin (MULTIVITAMIN) tablet Take 1 tablet by mouth daily.        . niacin (NIASPAN) 1000 MG CR tablet Take 1 tablet (1,000 mg total) by mouth at bedtime.  90 tablet  3  . nitroGLYCERIN (NITROSTAT) 0.4 MG SL tablet Place 0.4 mg under the tongue every 5 (five) minutes as needed. For chest pain      . PSYLLIUM PO Take 20 mLs by mouth every evening. Powder. 4 teaspoonfuls every evening       . telmisartan (MICARDIS) 20 MG tablet Take 1 tablet (20 mg total) by mouth daily.  90 tablet  3  .  Pitavastatin Calcium 4 MG TABS Take 1 tablet (4 mg total) by mouth at bedtime.  90 tablet  3   No current facility-administered medications on file prior to visit.    ROS:  Out of a complete 14 system review of symptoms, the patient complains only of the following symptoms, and all other reviewed systems are negative.  Ringing in the ears Shortness of breath Numbness  Blood pressure 126/56, pulse 76, height 5\' 11"  (1.803 m), weight 275 lb (124.739 kg).  Physical Exam  General: The patient is alert and cooperative at the time of the examination. The patient is markedly obese.  Skin: 1+ edema is noted in both ankles.   Neurologic Exam  Cranial nerves: Facial symmetry is present. Speech is normal, no aphasia or dysarthria is noted. Extraocular movements are full. Visual fields are full.  Motor: The patient has good strength in all 4 extremities.  Coordination: The patient has good finger-nose-finger and heel-to-shin bilaterally.  Gait and station: The patient has a wide-based gait, uses a cane for ambulation. Gait is unsteady. Tandem gait was not attempted. Romberg is negative. No drift is seen.  Reflexes: Deep tendon reflexes are symmetric, but deep tendon reflexes are depressed throughout..   Assessment/Plan:  One. Peripheral neuropathy  2. History of vertigo  3. Gait disorder  The patient is doing relatively well at this time. The patient does have a significant gait disorder, and he will need to use good judgment to improve safety. The patient is using a cane for ambulation. The patient does not require medications for neuropathy discomfort. The patient will be followed on a conservative basis as the vertigo has improved at this point. The patient will followup in 6-8 months.  Marlan Palau MD 03/19/2013 8:31 AM  [facility] [address] [place], [place] [address]  Phone [phone] Fax [phone]

## 2013-04-02 ENCOUNTER — Telehealth: Payer: Self-pay | Admitting: Cardiology

## 2013-04-02 MED ORDER — NITROGLYCERIN 0.4 MG SL SUBL: 0.4000 mg | SUBLINGUAL_TABLET | SUBLINGUAL | Status: DC | PRN

## 2013-04-02 NOTE — Telephone Encounter (Signed)
New problem    Pt need a prescription for Nitrostat 0.4mg ..[facility]

## 2013-04-02 NOTE — Telephone Encounter (Signed)
Refill completed.

## 2013-04-06 ENCOUNTER — Telehealth: Payer: Self-pay | Admitting: Cardiology

## 2013-04-06 NOTE — Telephone Encounter (Signed)
Phone number for authorization is [phone]

## 2013-04-06 NOTE — Telephone Encounter (Signed)
Patient called to state that he had not received his Pitavastatin 4 mg and he believes he needs authorization from [place] in order to receive it from [facility].  I called [facility] and gave authorization.  This case ID # is [id number] per Rosey Bath.  Patient is to call [phone] to have order processed now that it has been authorized

## 2013-04-06 NOTE — Telephone Encounter (Signed)
New Problem:     Patient called in because his mail order pharmacy stated that he would need a prior authorization in order to receive his Pitavastatin ([place]).  Please call back.

## 2013-05-28 ENCOUNTER — Ambulatory Visit (INDEPENDENT_AMBULATORY_CARE_PROVIDER_SITE_OTHER): Payer: Medicare Other | Admitting: Family Medicine

## 2013-05-28 ENCOUNTER — Encounter: Payer: Self-pay | Admitting: Family Medicine

## 2013-05-28 VITALS — BP 128/62 | HR 72 | Temp 97.4°F | Wt 275.8 lb

## 2013-05-28 DIAGNOSIS — R0989 Other specified symptoms and signs involving the circulatory and respiratory systems: Secondary | ICD-10-CM

## 2013-05-28 DIAGNOSIS — M109 Gout, unspecified: Secondary | ICD-10-CM

## 2013-05-28 DIAGNOSIS — R269 Unspecified abnormalities of gait and mobility: Secondary | ICD-10-CM

## 2013-05-28 DIAGNOSIS — M722 Plantar fascial fibromatosis: Secondary | ICD-10-CM | POA: Insufficient documentation

## 2013-05-28 DIAGNOSIS — R739 Hyperglycemia, unspecified: Secondary | ICD-10-CM

## 2013-05-28 DIAGNOSIS — R7309 Other abnormal glucose: Secondary | ICD-10-CM

## 2013-05-28 LAB — HEMOGLOBIN A1C: Hgb A1c MFr Bld: 6.2 % (ref 4.6–6.5)

## 2013-05-28 NOTE — Assessment & Plan Note (Signed)
He is cautious and has had neuro f/u.  Continue as is.  Using a cane.

## 2013-05-28 NOTE — Assessment & Plan Note (Signed)
Recheck A1c today, d/w pt about diet and lifestyle changes see notes on labs.  >25 min spent with face to face with patient, >50% counseling and/or coordinating care.

## 2013-05-28 NOTE — Assessment & Plan Note (Signed)
Per podiatry 

## 2013-05-28 NOTE — Patient Instructions (Addendum)
Go to the lab on the way out.  We'll contact you with your lab report. I would recheck your other labs before a physical in about 6 months Take care.  Glad to see you.

## 2013-05-28 NOTE — Assessment & Plan Note (Signed)
He'll f/u with cards later in the year.

## 2013-05-28 NOTE — Assessment & Plan Note (Signed)
Improved overall.  Continue current meds.

## 2013-05-28 NOTE — Progress Notes (Signed)
He continues to work on home exercises to help with balance.  He gets up slowly and focuses on distant objects to compensate.  He has dec in sensation in the feet, w/o pain usually but has been seeing podiatry about plantar fasciitis (injected twice per podiatry).  He is using voltaren gel with some relief.  He had orthotics prev.    Hyperglycemia.  Due for A1c.  We discussed diet changes.  This was affected when his wife died.   He isn't having gout flares typically.  He occ has some MTP pain, that responds to topical voltaren. He doesn't have the sig flares like prev.    He had a tick bite on R antecubital area, removed.  Still with a mild local irritation but no systemic symptoms. It wasn't engorged.    We talked about his situation as a widower.  He is staying busy and has social support.  "I have a lot going on."  This is helping some and he is tolerating the changes.    Meds, vitals, and allergies reviewed.   ROS: See HPI.  Otherwise, noncontributory.  nad ncat Mmm rrr ctab abd soft, normal BS Ext with trace edema

## 2013-07-11 ENCOUNTER — Other Ambulatory Visit: Payer: Self-pay | Admitting: Cardiology

## 2013-09-15 ENCOUNTER — Ambulatory Visit (INDEPENDENT_AMBULATORY_CARE_PROVIDER_SITE_OTHER): Payer: Medicare Other

## 2013-09-15 DIAGNOSIS — Z23 Encounter for immunization: Secondary | ICD-10-CM

## 2013-09-20 ENCOUNTER — Encounter: Payer: Self-pay | Admitting: Neurology

## 2013-09-20 ENCOUNTER — Ambulatory Visit (INDEPENDENT_AMBULATORY_CARE_PROVIDER_SITE_OTHER): Payer: Medicare Other | Admitting: Neurology

## 2013-09-20 VITALS — BP 145/69 | HR 57 | Ht 72.0 in | Wt 276.0 lb

## 2013-09-20 DIAGNOSIS — R42 Dizziness and giddiness: Secondary | ICD-10-CM

## 2013-09-20 DIAGNOSIS — G629 Polyneuropathy, unspecified: Secondary | ICD-10-CM

## 2013-09-20 DIAGNOSIS — G609 Hereditary and idiopathic neuropathy, unspecified: Secondary | ICD-10-CM

## 2013-09-20 DIAGNOSIS — R269 Unspecified abnormalities of gait and mobility: Secondary | ICD-10-CM

## 2013-09-20 NOTE — Progress Notes (Signed)
Reason for visit: Peripheral neuropathy  James [name] is an 79 y.o. male  History of present illness:  James [name] is an 79 year old left-handed white male with a history of a peripheral neuropathy and episodes of benign positional vertigo. The patient has done well since last seen. The patient has not had any significant issues with vertigo, and he indicates that his gait disorder remains stable. The patient has not had any falls, but he will occasionally use a cane when he is walking on uneven ground. The patient has had some issues with plantar fasciitis, but this has been improved with the use of magnets. The patient denies any other new medical issues that have come up since last seen. The patient is not taking meclizine at this point.  Past Medical History  Diagnosis Date  . Bradycardia     Hypertensive  . carotid bruit, right   . hypercholesterolemia     Trig 234/293;takes Simcor daily  . Obesity, morbid   . Vasculitis   . Immune thrombocytopenic purpura     Dr. Cyndie Chime  . Dermatitis     legs  . Blood in stool   . Bleeding gums   . Chronic low back pain   . Myalgia and myositis   . Unspecified vitamin D deficiency   . Bladder diverticulum   . Primary osteoarthritis of both knees   . Anxiety   . Nephrolithiasis   . History of elevated glucose   . Thrombophlebitis     right forearm  . History of BPH   . History of colonic polyps     James [name]  . Epididymitis, left     S/P Epidimectomy  . Pneumonia, bacterial     RML resolved, spirometry, normal  . History of ITP 2011    per Dr. Cyndie Chime 2011, normal platelets  . Thrombocytopenia 05/20-05/27/10    Hosp ITP severe, Dr. Cyndie Chime  . Splenomegaly 05/15/09    abd U/S NML  . Hypertension, benign     takes Micardis daily  . Sleep apnea     sitting/lying/exertion  . Neuropathy     acute  . Arthritis     hands  . Bruises easily   . H/O hiatal hernia   . GERD (gastroesophageal reflux disease)     hx of  but doesn't take any meds now  . Urinary frequency   . Urinary urgency   . Nocturia   . Urinary leakage   . Malignant melanoma of ear     right  . Abnormality of gait 03/19/2013    Past Surgical History  Procedure Laterality Date  . Kidney stone retrieval  1950's  . Fem cutaneous nerve entrapment  1977    due to surgery (mild lat anesth of bilat legs)  . Herniorraphy      x 2 right  . Limited pelvis/hip bone scan  04/99    negative  . Bone scan  04/00  . Cataract extraction  02/04    right  . Cataract extraction  02/04    righ  . Wide local excision, right ear  06/04    melanoma with flap  . Stress cardiolite  05/11/04    mild inf. ischemia, EF 71%  . Left shoulder surgery  06/17/2005  . Bronchoscopy  09/08    RML collapse with chronic pneumonia, bx neg (Dr. Delford Field)  . Right middle finger cyst excision  03/07/09    DIP Joint Mucoid (Dr. Merlyn Lot)  . Eyelid & eyebrow  lift  1996  . Right middle finger cyst reexcision  08/15/09    DIP Joint Mucoid Cyst (Dr. Merlyn Lot)  . Appendectomy  1950  . Hernia repair  1959    x 2  . Excision of mucoid tumor    . Cardiac catheterization  05/23/04    mild plague-statin  . Colonoscopy    . Total knee arthroplasty  01/10/2012    Procedure: TOTAL KNEE ARTHROPLASTY;  Surgeon: Verlee Rossetti, MD;  Location: [facility] OR;  Service: Orthopedics;  Laterality: Left;  . Testicle mass      removal of mass, not removal of testicle, noncancerous    Family History  Problem Relation Age of Onset  . Cancer Mother     thyroid  . Diabetes Father   . Heart disease Father     MI  . Stroke Brother     cerebral hemm  . Cancer Other     colon, ? brother  . Anesthesia problems Neg Hx   . Hypotension Neg Hx   . Malignant hyperthermia Neg Hx   . Pseudochol deficiency Neg Hx   . Prostate cancer Neg Hx   . Colon cancer Brother     Social history:  reports that he quit smoking about 46 years ago. He has never used smokeless tobacco. He reports that  drinks  alcohol. He reports that he does not use illicit drugs.    Allergies  Allergen Reactions  . Penicillins     REACTION: Rash, swelling  . Rofecoxib   . Sulfamethoxazole W-Trimethoprim     REACTION: questional onset of low platelets and bleeding  . Tramadol Hcl     REACTION: dizziness, drowsiness  . Neosporin [Neomycin-Polymyxin-Gramicidin] Rash    Medications:  Current Outpatient Prescriptions on File Prior to Visit  Medication Sig Dispense Refill  . allopurinol (ZYLOPRIM) 300 MG tablet Take 1 tablet (300 mg total) by mouth daily.  90 tablet  3  . aspirin 81 MG tablet Take 81 mg by mouth every evening.       . Cholecalciferol (VITAMIN D3) 2000 UNITS capsule Take 2,000 Units by mouth daily.        . diclofenac sodium (VOLTAREN) 1 % GEL Apply 4 g topically 4 (four) times daily.      . furosemide (LASIX) 40 MG tablet TAKE 2 TABLETS (80MG  TOTAL) DAILY  180 tablet  0  . Misc Natural Products (OSTEO BI-FLEX JOINT SHIELD PO) Take 2 tablets by mouth daily.       . Multiple Vitamin (MULTIVITAMIN) tablet Take 1 tablet by mouth daily.        . niacin (NIASPAN) 1000 MG CR tablet Take 1,000 mg by mouth at bedtime.      . nitroGLYCERIN (NITROSTAT) 0.4 MG SL tablet Place 1 tablet (0.4 mg total) under the tongue every 5 (five) minutes as needed. For chest pain  25 tablet  5  . Pitavastatin Calcium (LIVALO) 4 MG TABS Take 1 tablet by mouth at bedtime.      . Probiotic Product (PROBIOTIC & ACIDOPHILUS EX ST PO) Take 1 tablet by mouth daily.      . PSYLLIUM PO Take 20 mLs by mouth every evening. Powder. 4 teaspoonfuls every evening       . telmisartan (MICARDIS) 20 MG tablet Take 1 tablet (20 mg total) by mouth daily.  90 tablet  3   No current facility-administered medications on file prior to visit.    ROS:  Out of a complete 14  system review of symptoms, the patient complains only of the following symptoms, and all other reviewed systems are negative.  Numbness in the feet Gait  disorder  Blood pressure 145/69, pulse 57, height 6' (1.829 m), weight 276 lb (125.193 kg).  Physical Exam  General: The patient is alert and cooperative at the time of the examination. The patient is moderately to markedly obese.  Skin: 2+ edema of ankles is noted bilaterally.   Neurologic Exam  Cranial nerves: Facial symmetry is present. Speech is normal, no aphasia or dysarthria is noted. Extraocular movements are full. Visual fields are full.  Motor: The patient has good strength in all 4 extremities.  Coordination: The patient has good finger-nose-finger and heel-to-shin bilaterally.  Gait and station: The patient has a normal gait. Tandem gait is slightly unsteady. Romberg is negative. No drift is seen.  Reflexes: Deep tendon reflexes are symmetric, but are depressed.   Assessment/Plan:  1. Peripheral neuropathy  2. Gait disorder  3. History of vertigo  The patient is doing well at this point. The patient is not requiring any medications for discomfort for the peripheral neuropathy. The patient will followup in one year. The patient is remaining safe with ambulation.  Marlan Palau MD 09/20/2013 3:27 PM  [facility] [address] [place], [place] [address]  Phone [phone] Fax [phone]

## 2013-09-23 ENCOUNTER — Other Ambulatory Visit: Payer: Self-pay | Admitting: Cardiology

## 2013-10-14 ENCOUNTER — Other Ambulatory Visit: Payer: Self-pay | Admitting: Family Medicine

## 2013-10-14 DIAGNOSIS — D696 Thrombocytopenia, unspecified: Secondary | ICD-10-CM

## 2013-10-14 DIAGNOSIS — M109 Gout, unspecified: Secondary | ICD-10-CM

## 2013-10-14 DIAGNOSIS — R739 Hyperglycemia, unspecified: Secondary | ICD-10-CM

## 2013-10-14 DIAGNOSIS — I10 Essential (primary) hypertension: Secondary | ICD-10-CM

## 2013-10-22 ENCOUNTER — Other Ambulatory Visit (INDEPENDENT_AMBULATORY_CARE_PROVIDER_SITE_OTHER): Payer: Medicare Other

## 2013-10-22 DIAGNOSIS — R739 Hyperglycemia, unspecified: Secondary | ICD-10-CM

## 2013-10-22 DIAGNOSIS — I10 Essential (primary) hypertension: Secondary | ICD-10-CM

## 2013-10-22 DIAGNOSIS — R7309 Other abnormal glucose: Secondary | ICD-10-CM

## 2013-10-22 DIAGNOSIS — D696 Thrombocytopenia, unspecified: Secondary | ICD-10-CM

## 2013-10-22 DIAGNOSIS — M109 Gout, unspecified: Secondary | ICD-10-CM

## 2013-10-22 LAB — CBC WITH DIFFERENTIAL/PLATELET
Basophils Absolute: 0 10*3/uL (ref 0.0–0.1)
Basophils Relative: 0.5 % (ref 0.0–3.0)
Eosinophils Absolute: 0.1 10*3/uL (ref 0.0–0.7)
Hemoglobin: 13.8 g/dL (ref 13.0–17.0)
MCHC: 33.6 g/dL (ref 30.0–36.0)
MCV: 93 fl (ref 78.0–100.0)
Monocytes Absolute: 0.8 10*3/uL (ref 0.1–1.0)
Neutro Abs: 5.7 10*3/uL (ref 1.4–7.7)
Neutrophils Relative %: 62.4 % (ref 43.0–77.0)
RBC: 4.41 Mil/uL (ref 4.22–5.81)

## 2013-10-22 LAB — COMPREHENSIVE METABOLIC PANEL
AST: 24 U/L (ref 0–37)
Albumin: 3.6 g/dL (ref 3.5–5.2)
Alkaline Phosphatase: 73 U/L (ref 39–117)
BUN: 11 mg/dL (ref 6–23)
Creatinine, Ser: 0.9 mg/dL (ref 0.4–1.5)
Potassium: 3.9 mEq/L (ref 3.5–5.1)
Total Protein: 6.8 g/dL (ref 6.0–8.3)

## 2013-10-22 LAB — LIPID PANEL
LDL Cholesterol: 54 mg/dL (ref 0–99)
Total CHOL/HDL Ratio: 3
Triglycerides: 157 mg/dL — ABNORMAL HIGH (ref 0.0–149.0)

## 2013-10-22 LAB — HEMOGLOBIN A1C: Hgb A1c MFr Bld: 6.4 % (ref 4.6–6.5)

## 2013-10-22 LAB — URIC ACID: Uric Acid, Serum: 5.3 mg/dL (ref 4.0–7.8)

## 2013-10-23 DIAGNOSIS — I82409 Acute embolism and thrombosis of unspecified deep veins of unspecified lower extremity: Secondary | ICD-10-CM

## 2013-10-23 HISTORY — DX: Acute embolism and thrombosis of unspecified deep veins of unspecified lower extremity: I82.409

## 2013-10-29 ENCOUNTER — Ambulatory Visit (INDEPENDENT_AMBULATORY_CARE_PROVIDER_SITE_OTHER): Payer: Medicare Other | Admitting: Family Medicine

## 2013-10-29 ENCOUNTER — Other Ambulatory Visit: Payer: Self-pay | Admitting: Family Medicine

## 2013-10-29 ENCOUNTER — Encounter: Payer: Self-pay | Admitting: Family Medicine

## 2013-10-29 VITALS — BP 138/60 | HR 66 | Temp 97.8°F | Ht 71.0 in | Wt 265.5 lb

## 2013-10-29 DIAGNOSIS — R609 Edema, unspecified: Secondary | ICD-10-CM

## 2013-10-29 DIAGNOSIS — R739 Hyperglycemia, unspecified: Secondary | ICD-10-CM

## 2013-10-29 DIAGNOSIS — M109 Gout, unspecified: Secondary | ICD-10-CM

## 2013-10-29 DIAGNOSIS — R7309 Other abnormal glucose: Secondary | ICD-10-CM

## 2013-10-29 DIAGNOSIS — Z0000 Encounter for general adult medical examination without abnormal findings: Secondary | ICD-10-CM

## 2013-10-29 DIAGNOSIS — R600 Localized edema: Secondary | ICD-10-CM

## 2013-10-29 DIAGNOSIS — I10 Essential (primary) hypertension: Secondary | ICD-10-CM

## 2013-10-29 NOTE — Progress Notes (Signed)
Pre-visit discussion using our clinic review tool. No additional management support is needed unless otherwise documented below in the visit note.  I have personally reviewed the Medicare Annual Wellness questionnaire and have noted 1. The patient's medical and social history 2. Their use of alcohol, tobacco or illicit drugs 3. Their current medications and supplements 4. The patient's functional ability including ADL's, fall risks, home safety risks and hearing or visual             impairment. 5. Diet and physical activities 6. Evidence for depression or mood disorders  The patients weight, height, BMI have been recorded in the chart and visual acuity is per eye clinic.  I have made referrals, counseling and provided education to the patient based review of the above and I have provided the pt with a written personalized care plan for preventive services.  See scanned forms.  Routine anticipatory guidance given to patient.  See health maintenance. Flu 2014 Shingles 2010  PNA 2006  Tetanus 2006  Colonoscopy not indicated due to age.  Prostate cancer screening per uro, not indicated today.  Discussed not continuing given age >17.  Advance directive d/w pt.  Daughter Dandra Velardi designated if incapacitated.  Cognitive function addressed- see scanned forms- and if abnormal then additional documentation follows.   Hypertension:    Using medication without problems or lightheadedness: yes Chest pain with exertion:no Edema: L lower leg edema for ~2 weeks.  Short of breath: at baseline, unchanged.   Elevated Cholesterol: Using medications without problems:y Muscle aches: n Diet compliance:y Exercise:n  Gout- no flares, no ADE on meds.  Uric acid lower on med.   Hyperglycemia d/w pt.  A1c not sig elevated compared to prev.  D/w pt about labs.   PMH and SH reviewed  Meds, vitals, and allergies reviewed.   ROS: See HPI.  Otherwise negative.    GEN: nad, alert and oriented HEENT:  mucous membranes moist NECK: supple w/o LA CV: rrr. PULM: ctab, no inc wob ABD: soft, +bs EXT: no edema in R leg, but 1-2+ edema in the L ankle w/o bands or cords in the calf.  Calf not ttp x2 SKIN: no acute rash

## 2013-10-29 NOTE — Assessment & Plan Note (Signed)
Can follow A1c episodically.  D/w pt.  No sig elevation on recent labs.

## 2013-10-29 NOTE — Assessment & Plan Note (Signed)
See scanned forms.  Routine anticipatory guidance given to patient.  See health maintenance. Flu 2014 Shingles 2010  PNA 2006  Tetanus 2006  Colonoscopy not indicated due to age.  Prostate cancer screening per uro, not indicated today.  Discussed not continuing given age >41.  Advance directive d/w pt.  Daughter Taksh Hjort designated if incapacitated.  Cognitive function addressed- see scanned forms- and if abnormal then additional documentation follows.

## 2013-10-29 NOTE — Patient Instructions (Signed)
Go to the lab on the way out.  We'll contact you with your lab report. Take care.  Don't change your meds.  Glad to see you.  

## 2013-10-29 NOTE — Assessment & Plan Note (Signed)
Controlled, continue current meds.   

## 2013-10-29 NOTE — Assessment & Plan Note (Signed)
Controlled, no ADE on meds.  No recent flares.  Continue allopurinol.

## 2013-10-29 NOTE — Assessment & Plan Note (Signed)
Unlikely to be DVT but with asymmetry would check D dimer.  If pos would get nonemergent u/s as this has been present for about 2 weeks.  He agrees.  Continue lasix in meantime.

## 2013-10-30 LAB — D-DIMER, QUANTITATIVE: D-Dimer, Quant: 3.13 ug/mL-FEU — ABNORMAL HIGH (ref 0.00–0.48)

## 2013-10-31 ENCOUNTER — Other Ambulatory Visit: Payer: Self-pay | Admitting: Family Medicine

## 2013-10-31 DIAGNOSIS — R600 Localized edema: Secondary | ICD-10-CM

## 2013-11-01 ENCOUNTER — Other Ambulatory Visit: Payer: Self-pay | Admitting: Family Medicine

## 2013-11-01 ENCOUNTER — Ambulatory Visit (HOSPITAL_COMMUNITY): Payer: Medicare Other | Attending: Cardiology

## 2013-11-01 ENCOUNTER — Telehealth: Payer: Self-pay | Admitting: Family Medicine

## 2013-11-01 DIAGNOSIS — R600 Localized edema: Secondary | ICD-10-CM

## 2013-11-01 DIAGNOSIS — M7989 Other specified soft tissue disorders: Secondary | ICD-10-CM | POA: Insufficient documentation

## 2013-11-01 DIAGNOSIS — E785 Hyperlipidemia, unspecified: Secondary | ICD-10-CM | POA: Insufficient documentation

## 2013-11-01 DIAGNOSIS — E119 Type 2 diabetes mellitus without complications: Secondary | ICD-10-CM | POA: Insufficient documentation

## 2013-11-01 DIAGNOSIS — I82890 Acute embolism and thrombosis of other specified veins: Secondary | ICD-10-CM | POA: Insufficient documentation

## 2013-11-01 DIAGNOSIS — Z87891 Personal history of nicotine dependence: Secondary | ICD-10-CM | POA: Insufficient documentation

## 2013-11-01 DIAGNOSIS — I2510 Atherosclerotic heart disease of native coronary artery without angina pectoris: Secondary | ICD-10-CM | POA: Insufficient documentation

## 2013-11-01 DIAGNOSIS — I10 Essential (primary) hypertension: Secondary | ICD-10-CM | POA: Insufficient documentation

## 2013-11-01 DIAGNOSIS — I824Z9 Acute embolism and thrombosis of unspecified deep veins of unspecified distal lower extremity: Secondary | ICD-10-CM | POA: Insufficient documentation

## 2013-11-01 DIAGNOSIS — I82819 Embolism and thrombosis of superficial veins of unspecified lower extremities: Secondary | ICD-10-CM | POA: Insufficient documentation

## 2013-11-01 DIAGNOSIS — M25819 Other specified joint disorders, unspecified shoulder: Secondary | ICD-10-CM

## 2013-11-01 MED ORDER — RIVAROXABAN 15 MG PO TABS: 15.0000 mg | ORAL_TABLET | Freq: Two times a day (BID) | ORAL | Status: DC

## 2013-11-01 NOTE — Telephone Encounter (Signed)
Patient at [place] appt.

## 2013-11-01 NOTE — Telephone Encounter (Signed)
Notify pt. See note re: leg u/s order.  Thanks .

## 2013-11-01 NOTE — Telephone Encounter (Signed)
Called Pt.  U/s pos for DVT.  Had d/w pt about if SOB or CP to ER.    Please call pt back.  Would start xarelto 15mg  BID x21 days and then change to 20mg  a day from that point on.  We can send the 20mg  rx later on.  Please verify local pharmacy preference and send rx.  If unable to get the medicine filled today, then let me know.   This would likely be easier than coumadin (no routine monitoring) and we wouldn't have to start him on lovenox injections concurrently.   If bleeding or bruising, then notify us.  Continue 81mg  ASA for now.  I'd like see him back in the office later this week.  Thanks.

## 2013-11-01 NOTE — Telephone Encounter (Signed)
Confidential Office Message [address] [place], [place] [address] p. [phone] f. [phone] To: [facility] (After Hours Triage) Fax: [phone] From: [facility] Date/ Time: 10/30/2013 11:27 AM Taken By: Merlinda Frederick, CSR Caller: James [name] Facility: [facility] Patient: James [name] DOB: Jun 01, 1928 Phone: [phone] Reason for Call: James [name] is calling from [facility] regarding a D dimer ordered on James [name], James [name] by Cheree Ditto Duncan11/06/2013 10:46:00 AM. The results were Alert High at 3.13. Regarding Appointment: Appt Date: Appt Time: Unknown Provider: Reason: Details: Confidential Outcome:

## 2013-11-03 ENCOUNTER — Encounter: Payer: Self-pay | Admitting: Family Medicine

## 2013-11-03 DIAGNOSIS — Z86718 Personal history of other venous thrombosis and embolism: Secondary | ICD-10-CM | POA: Insufficient documentation

## 2013-11-05 ENCOUNTER — Encounter: Payer: Self-pay | Admitting: Family Medicine

## 2013-11-05 ENCOUNTER — Telehealth: Payer: Self-pay | Admitting: Family Medicine

## 2013-11-05 ENCOUNTER — Ambulatory Visit (INDEPENDENT_AMBULATORY_CARE_PROVIDER_SITE_OTHER): Payer: Medicare Other | Admitting: Family Medicine

## 2013-11-05 VITALS — BP 148/62 | HR 72 | Temp 97.6°F | Wt 279.0 lb

## 2013-11-05 DIAGNOSIS — I82409 Acute embolism and thrombosis of unspecified deep veins of unspecified lower extremity: Secondary | ICD-10-CM

## 2013-11-05 DIAGNOSIS — I82402 Acute embolism and thrombosis of unspecified deep veins of left lower extremity: Secondary | ICD-10-CM

## 2013-11-05 MED ORDER — RIVAROXABAN 20 MG PO TABS: 20.0000 mg | ORAL_TABLET | Freq: Every day | ORAL | Status: DC

## 2013-11-05 NOTE — Telephone Encounter (Signed)
Patient advised.   James [name] asked that I tell him that his referral to Dr. Cyndie Chime will be placed in their work que but it may be a while (maybe a week or so) before they contact him.  I told him that if he had not heard anything by the end of next week, maybe call to follow up.

## 2013-11-05 NOTE — Patient Instructions (Signed)
Keep taking 15mg  twice a day for 3 weeks total.  Then change to 20mg  once a day.  I'll talk to cardiology in the meantime.  Keep taking 81mg  aspirin in the meantime, unless you have a lot more bruising than normal.  If so, notify me.  I wouldn't get a filter.  We don't need to recheck the ultrasound now.  Heart healthy diet.   Plan for 6 months of xarelto.  We'll need to make plans at that point.  Plan on a recheck in about 5 months so we can discuss.  30 min appointment at that point.  Take care.

## 2013-11-05 NOTE — Assessment & Plan Note (Addendum)
Would not be in need of a filter at this point.  W/o suggestive sx, would not get CTA of chest since it wouldn't change mgmt now.  We can plan for 6 months of xarelto for now.  He'll change to 20mg  qd after 3 weeks of 15mg  BID.  No sig bruising now.  If sig bruising, then we may need to cut ASA out.  Will ask for cards input on this.  D/w pt about bleeding risk.  We can consider hypercoagulable w/u and/or heme referral at the 6 month point.  Would not repeat u/s leg now or in near future unless edema was progressive or he had other alarming sx.  Advised elevation as tolerated, activity as tolerated.  Would not use compressing stockings for now as the edema may resolve on its own.  He has no sig stasis changes.   Without suggestive sx, I was planning on not starting investigation for an occult malignancy.  D/w pt.   >25 min spent with face to face with patient, >50% counseling and/or coordinating care.

## 2013-11-05 NOTE — Telephone Encounter (Signed)
Please call pt.  I talked with Dr. Shirlee Latch.  It would be okay to stop ASA for now per Shirlee Latch, so I took it off his list.  Shirlee Latch would also prefer him to talk with hematology.  I think this is reasonable and I put in the referral.  Continue as we planned o/w.  Thanks.

## 2013-11-05 NOTE — Progress Notes (Signed)
Pre-visit discussion using our clinic review tool. No additional management support is needed unless otherwise documented below in the visit note.  F/u for DVT.  He feels well.  No CP.  Not more SOB than baseline.  Still with some L leg edema. Father had h/o phlebitis but no known h/o DVT in the family.  Discussed options re: meds.  No sig bruising.  Still on 81mg  ASA.  Prev with prostate eval by Uro and h/o colonoscopy in past.  No known/ongoing cancer dx.  No new or otherwise alarming systemic sx at this point.   Meds, vitals, and allergies reviewed.   ROS: See HPI.  Otherwise, noncontributory.  nad Mmm rrr ctab abd soft Ext with L>R calf edema.

## 2013-11-09 ENCOUNTER — Telehealth: Payer: Self-pay | Admitting: Oncology

## 2013-11-09 NOTE — Telephone Encounter (Signed)
S/w pt and gve np appt 12/10 @ 1:30 w/Dr. Cyndie Chime Referring Dr. Crawford Givens Dx- DVT Welcome packet mailed.

## 2013-11-09 NOTE — Telephone Encounter (Signed)
C/D 11/09/13 for appt. 12/01/13

## 2013-11-19 ENCOUNTER — Other Ambulatory Visit: Payer: Self-pay | Admitting: Oncology

## 2013-11-19 ENCOUNTER — Encounter: Payer: Self-pay | Admitting: Oncology

## 2013-11-19 DIAGNOSIS — I82409 Acute embolism and thrombosis of unspecified deep veins of unspecified lower extremity: Secondary | ICD-10-CM

## 2013-11-19 DIAGNOSIS — I824Z2 Acute embolism and thrombosis of unspecified deep veins of left distal lower extremity: Secondary | ICD-10-CM

## 2013-11-19 HISTORY — DX: Acute embolism and thrombosis of unspecified deep veins of unspecified lower extremity: I82.409

## 2013-11-22 ENCOUNTER — Telehealth: Payer: Self-pay | Admitting: Oncology

## 2013-11-22 NOTE — Telephone Encounter (Signed)
CALLED PT AND [facility] APPT 12/03 @ 8:15.

## 2013-11-24 ENCOUNTER — Other Ambulatory Visit (HOSPITAL_BASED_OUTPATIENT_CLINIC_OR_DEPARTMENT_OTHER): Payer: Medicare Other | Admitting: Lab

## 2013-11-24 DIAGNOSIS — I824Z2 Acute embolism and thrombosis of unspecified deep veins of left distal lower extremity: Secondary | ICD-10-CM

## 2013-11-24 DIAGNOSIS — D693 Immune thrombocytopenic purpura: Secondary | ICD-10-CM

## 2013-11-24 DIAGNOSIS — I824Z9 Acute embolism and thrombosis of unspecified deep veins of unspecified distal lower extremity: Secondary | ICD-10-CM

## 2013-11-24 LAB — LACTATE DEHYDROGENASE (CC13): LDH: 202 U/L (ref 125–245)

## 2013-11-24 LAB — CBC & DIFF AND RETIC
Basophils Absolute: 0 10*3/uL (ref 0.0–0.1)
EOS%: 2.8 % (ref 0.0–7.0)
Eosinophils Absolute: 0.2 10*3/uL (ref 0.0–0.5)
HCT: 40.3 % (ref 38.4–49.9)
HGB: 13 g/dL (ref 13.0–17.1)
Immature Retic Fract: 21.1 % — ABNORMAL HIGH (ref 3.00–10.60)
LYMPH%: 25.3 % (ref 14.0–49.0)
MCV: 94.2 fL (ref 79.3–98.0)
MONO#: 0.4 10*3/uL (ref 0.1–0.9)
MONO%: 5.6 % (ref 0.0–14.0)
NEUT#: 5 10*3/uL (ref 1.5–6.5)
NEUT%: 66 % (ref 39.0–75.0)
RDW: 14.7 % — ABNORMAL HIGH (ref 11.0–14.6)
Retic Ct Abs: 63.77 10*3/uL (ref 34.80–93.90)
lymph#: 1.9 10*3/uL (ref 0.9–3.3)

## 2013-11-24 LAB — COMPREHENSIVE METABOLIC PANEL (CC13)
ALT: 26 U/L (ref 0–55)
AST: 25 U/L (ref 5–34)
Albumin: 3.6 g/dL (ref 3.5–5.0)
Alkaline Phosphatase: 87 U/L (ref 40–150)
BUN: 12.3 mg/dL (ref 7.0–26.0)
Chloride: 107 mEq/L (ref 98–109)
Creatinine: 0.9 mg/dL (ref 0.7–1.3)
Total Bilirubin: 0.57 mg/dL (ref 0.20–1.20)

## 2013-11-24 LAB — MORPHOLOGY
PLT EST: ADEQUATE
RBC Comments: NORMAL

## 2013-11-24 LAB — CHCC SMEAR

## 2013-11-25 LAB — BETA-2 GLYCOPROTEIN ANTIBODIES
Beta-2 Glyco I IgG: 5 G Units (ref <20)
Beta-2-Glycoprotein I IgA: 0 A Units (ref <20)
Beta-2-Glycoprotein I IgM: 13 M Units (ref <20)

## 2013-11-25 LAB — LUPUS ANTICOAGULANT PANEL
DRVVT 1:1 Mix: 39.7 secs (ref <42.9)
Lupus Anticoagulant: NOT DETECTED

## 2013-11-25 LAB — CARDIOLIPIN ANTIBODIES, IGG, IGM, IGA
Anticardiolipin IgA: 7 APL U/mL (ref <22)
Anticardiolipin IgG: 11 GPL U/mL (ref <23)
Anticardiolipin IgM: 4 MPL U/mL (ref <11)

## 2013-11-25 LAB — D-DIMER, QUANTITATIVE: D-Dimer, Quant: 0.53 ug/mL-FEU — ABNORMAL HIGH (ref 0.00–0.48)

## 2013-12-01 ENCOUNTER — Ambulatory Visit (HOSPITAL_BASED_OUTPATIENT_CLINIC_OR_DEPARTMENT_OTHER): Payer: Medicare Other | Admitting: Oncology

## 2013-12-01 ENCOUNTER — Encounter: Payer: Self-pay | Admitting: Oncology

## 2013-12-01 ENCOUNTER — Ambulatory Visit: Payer: Medicare Other

## 2013-12-01 VITALS — BP 157/73 | HR 87 | Temp 97.5°F | Resp 19 | Ht 71.0 in | Wt 285.0 lb

## 2013-12-01 DIAGNOSIS — D696 Thrombocytopenia, unspecified: Secondary | ICD-10-CM

## 2013-12-01 DIAGNOSIS — I82409 Acute embolism and thrombosis of unspecified deep veins of unspecified lower extremity: Secondary | ICD-10-CM

## 2013-12-01 DIAGNOSIS — I82402 Acute embolism and thrombosis of unspecified deep veins of left lower extremity: Secondary | ICD-10-CM

## 2013-12-01 DIAGNOSIS — I10 Essential (primary) hypertension: Secondary | ICD-10-CM

## 2013-12-01 DIAGNOSIS — D693 Immune thrombocytopenic purpura: Secondary | ICD-10-CM

## 2013-12-01 NOTE — Progress Notes (Signed)
Hematology and Oncology Follow Up Visit  James [name] [id number] 1928/11/12 79 y.o. 12/01/2013 6:00 PM   Principle Diagnosis: Encounter Diagnoses  Name Primary?  . DVT (deep venous thrombosis), left Yes  . Thrombocytopenia      Interim History:   This is an old patient with a new problem. Pleasant 79 year old man I initially evaluated for acute thrombocytopenia in May of 2010. He presented to [facility] on 05/15/09  with an acute history of leg cramps, generalized aching, oropharyngeal bleeding and a diffuse petechial rash.  He was found to have a platelet count of less than 5,000 with a normal hemoglobin, normal white count and differential.  Review of the peripheral blood film showed virtual absence of all platelets.  No immature myeloid cells to suggest a leukemic process.  No lymphadenopathy or organomegaly on exam.  An ultrasound of the abdomen confirmed absence of abdominal adenopathy or splenomegaly.  He had just completed a 10-day course of oral Septra for a low-grade infection of his index finger.  Although  sulfa drugs are not a usual cause of thrombocytopenia, his rapid recovery and durable remission suggest in fact that this was a reaction to the Septra.   He was treated with parenteral steroids and a single dose of WinRho anti-d immunoglobulin.  Subsequent blood typing showed that he was Rh negative, so we would not expect to see much of an effect from the WinRho.  Fortunately, he had a very brisk response to steroids. He has not developed any subsequent signs or symptoms of a collagen vascular disorder or lymphoma.   He presented to his primary care physician last month with sudden onset of swelling of his left ankle. No history of trauma. A D.-dimer was done and was significantly elevated at 3.13 units on 10/31/2013. A venous Doppler study done the next day showed acute thrombus in the distal posterior tibial and peroneal veins. He was started on anticoagulation with  Xarelto on November 10 with a planned course of treatment of 6 months.  He is a former smoker who stopped in October of 1968. He reports no change in bowel habit and states that his last colonoscopy was done about 3 years ago by Dr. Sheryn Bison. He has no urinary tract symptoms and states that a prostate exam done last month by history allergist was reported to him as normal. He is obese. He certainly has not lost any weight recently.  In anticipation of today's visit I checked what I thought would be a reasonable screening evaluation for an elderly patient:  Hemoglobin 13, hematocrit 40, white count 7600 with 66% neutrophils, 25 lymphocytes, 6 monocytes, platelet count 271,000. Chemistry profile is normal. LDH normal at 202. ESR 12 mm. Anticardiolipin antibodies, antibodies to beta 2 glycoprotein 1, and a lupus-type anticoagulant not detected.   Past medical history also includes: Hypertension, hyperlipidemia, right carotid bruit, osteoarthritis, nephrolithiasis, benign colon polyps, obesity sleep apnea, neuropathy, GERD, malignant melanoma resected from his right ear.  Social history: He just lost his wife last year after a long battle with multiple myeloma. 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 He stopped smoking over 40 years ago.    Medications: reviewed  Allergies:  Allergies  Allergen Reactions  . Penicillins     REACTION: Rash, swelling  . Rofecoxib   . Sulfamethoxazole-Trimethoprim     REACTION: questional onset of low platelets and bleeding  . Tramadol Hcl     REACTION: dizziness, drowsiness  . Neosporin [Neomycin-Polymyxin-Gramicidin]  Rash    Review of Systems: Hematology: No bruising or bleeding ENT ROS: No sore throat Breast ROS:  Respiratory ROS: No cough or dyspnea Cardiovascular ROS:   No chest pain or palpitations Gastrointestinal ROS:   See above Genito-Urinary ROS: See above Musculoskeletal ROS no muscle bone or joint pain Neurological ROS:  No headache or change in vision Dermatological ROS: No rash or ecchymosis Remaining ROS negative  Physical Exam: Blood pressure 157/73, pulse 87, temperature 97.5 F (36.4 C), temperature source Oral, resp. rate 19, height 5\' 11"  (1.803 m), weight 285 lb (129.275 kg). Wt Readings from Last 3 Encounters:  12/01/13 285 lb (129.275 kg)  11/05/13 279 lb (126.554 kg)  10/29/13 265 lb 8 oz (120.43 kg)     General appearance: Jovial, tall, overweight, Caucasian man HENNT: Pharynx no erythema, exudate, mass, or ulcer. No thyromegaly or thyroid nodules Lymph nodes: No cervical, supraclavicular, or axillary lymphadenopathy Breasts:  Lungs: Clear to auscultation, resonant to percussion throughout Heart: Regular rhythm, no murmur, no gallop, no rub, no click, no edema Abdomen: Soft, nontender, normal bowel sounds, no mass, no organomegaly Extremities: Left calf and ankle are swollen. Left calf 43 cm right 41.5 , no calf tenderness Musculoskeletal: no joint deformities GU:  Vascular: Carotid pulses 2+, no bruits,  Neurologic: Alert, oriented, PERRLA cranial nerves grossly normal, motor strength 5 over 5, reflexes 1+ symmetric, upper body coordination normal, gait normal, Skin: No rash or ecchymosis  Lab Results: CBC W/Diff    Component Value Date/Time   WBC 7.6 11/24/2013 0812   WBC 9.1 10/22/2013 0822   RBC 4.28 11/24/2013 0812   RBC 4.41 10/22/2013 0822   RBC 4.30 05/18/2009 0348   HGB 13.0 11/24/2013 0812   HGB 13.8 10/22/2013 0822   HCT 40.3 11/24/2013 0812   HCT 41.0 10/22/2013 0822   PLT 271 11/24/2013 0812   PLT 244.0 10/22/2013 0822   MCV 94.2 11/24/2013 0812   MCV 93.0 10/22/2013 0822   MCH 30.4 11/24/2013 0812   MCH 30.2 01/13/2012 0645   MCHC 32.3 11/24/2013 0812   MCHC 33.6 10/22/2013 0822   RDW 14.7* 11/24/2013 0812   RDW 15.4* 10/22/2013 0822   LYMPHSABS 1.9 11/24/2013 0812   LYMPHSABS 2.4 10/22/2013 0822   MONOABS 0.4 11/24/2013 0812   MONOABS 0.8 10/22/2013 0822   EOSABS  0.2 11/24/2013 0812   EOSABS 0.1 10/22/2013 0822   BASOSABS 0.0 11/24/2013 0812   BASOSABS 0.0 10/22/2013 0822     Chemistry      Component Value Date/Time   NA 141 11/24/2013 0813   NA 139 10/22/2013 0822   K 3.8 11/24/2013 0813   K 3.9 10/22/2013 0822   CL 101 10/22/2013 0822   CO2 23 11/24/2013 0813   CO2 30 10/22/2013 0822   BUN 12.3 11/24/2013 0813   BUN 11 10/22/2013 0822   CREATININE 0.9 11/24/2013 0813   CREATININE 0.9 10/22/2013 0822      Component Value Date/Time   CALCIUM 9.5 11/24/2013 0813   CALCIUM 9.1 10/22/2013 0822   ALKPHOS 87 11/24/2013 0813   ALKPHOS 73 10/22/2013 0822   AST 25 11/24/2013 0813   AST 24 10/22/2013 0822   ALT 26 11/24/2013 0813   ALT 23 10/22/2013 0822   BILITOT 0.57 11/24/2013 0813   BILITOT 0.8 10/22/2013 4098    D.-dimer: Down to 0.53 on 11/24/2013.   Radiological Studies: See discussion above   Impression:  #1. Acute, unprovoked, left lower extremity distal DVT I agree with  6 months of anticoagulation. I think his risk factors primarily his age and underlying cardiac disease. We discussed the fact that rarely, occult malignancy can present with DVT in about 4% of people presenting with otherwise unexplained clots. He appears to be current on all of his health maintenance exams and has no signs or symptoms to suggest an underlying malignancy. It might be reasonable to document his history of a colonoscopy 3 years ago and get some current stool guaiac cards. I would repeat a D.-dimer at the end of his prescribed anticoagulation course.  #2. Drug related ITP Durable remission off all treatment since shortly after diagnosis in May of 2010.    CC: Patient Care Team: Joaquim Nam, MD as PCP - General   Levert Feinstein, MD 12/10/20146:00 PM

## 2013-12-01 NOTE — Progress Notes (Signed)
Checked in new pt with no financial concerns. °

## 2013-12-04 ENCOUNTER — Other Ambulatory Visit: Payer: Self-pay | Admitting: Cardiology

## 2013-12-05 ENCOUNTER — Other Ambulatory Visit: Payer: Self-pay | Admitting: Cardiology

## 2013-12-06 ENCOUNTER — Encounter: Payer: Self-pay | Admitting: Cardiology

## 2013-12-06 ENCOUNTER — Encounter: Payer: Self-pay | Admitting: *Deleted

## 2013-12-06 ENCOUNTER — Ambulatory Visit (INDEPENDENT_AMBULATORY_CARE_PROVIDER_SITE_OTHER): Payer: Medicare Other | Admitting: Cardiology

## 2013-12-06 VITALS — BP 142/66 | HR 76 | Ht 72.0 in | Wt 286.0 lb

## 2013-12-06 DIAGNOSIS — I10 Essential (primary) hypertension: Secondary | ICD-10-CM

## 2013-12-06 DIAGNOSIS — I82402 Acute embolism and thrombosis of unspecified deep veins of left lower extremity: Secondary | ICD-10-CM

## 2013-12-06 DIAGNOSIS — R0602 Shortness of breath: Secondary | ICD-10-CM

## 2013-12-06 DIAGNOSIS — I5032 Chronic diastolic (congestive) heart failure: Secondary | ICD-10-CM | POA: Insufficient documentation

## 2013-12-06 DIAGNOSIS — I82409 Acute embolism and thrombosis of unspecified deep veins of unspecified lower extremity: Secondary | ICD-10-CM

## 2013-12-06 DIAGNOSIS — I509 Heart failure, unspecified: Secondary | ICD-10-CM

## 2013-12-06 DIAGNOSIS — I2510 Atherosclerotic heart disease of native coronary artery without angina pectoris: Secondary | ICD-10-CM

## 2013-12-06 LAB — BASIC METABOLIC PANEL
Calcium: 9.2 mg/dL (ref 8.4–10.5)
Chloride: 104 mEq/L (ref 96–112)
Creatinine, Ser: 0.8 mg/dL (ref 0.4–1.5)
GFR: 99.46 mL/min (ref >60.00)
Sodium: 139 mEq/L (ref 135–145)

## 2013-12-06 MED ORDER — TELMISARTAN 40 MG PO TABS: 40.0000 mg | ORAL_TABLET | Freq: Every day | ORAL | Status: DC

## 2013-12-06 MED ORDER — NITROGLYCERIN 0.4 MG SL SUBL: 0.4000 mg | SUBLINGUAL_TABLET | SUBLINGUAL | Status: DC | PRN

## 2013-12-06 NOTE — Patient Instructions (Signed)
Increase telmisartan (micardis) to 40mg  daily. You can take 2 of your 20mg  tablets at the same time and use your current supply.   Do not take aspirin since you are taking Xarelto.   Your physician recommends that you have lab work today--BMET/BNP.    Your physician has requested that you have an echocardiogram. Echocardiography is a painless test that uses sound waves to create images of your heart. It provides your doctor with information about the size and shape of your heart and how well your heart's chambers and valves are working. This procedure takes approximately one hour. There are no restrictions for this procedure.  Your physician wants you to follow-up in: 6 months with Dr Shirlee Latch. (June 2015).  You will receive a reminder letter in the mail two months in advance. If you don't receive a letter, please call our office to schedule the follow-up appointment.

## 2013-12-06 NOTE — Progress Notes (Signed)
Patient ID: James [name], male   DOB: [birth year], 79 y.o.   MRN: [id number] PCP: Dr. Para March  79 yo with history of nonobstructive CAD and chronic diastolic CHF presents for cardiology followup.  He has been seen by Dr. Daleen Squibb in the past and is seen by me for the first time today.  Main issue recently has been a left leg DVT.  Only trigger seems to have been that he sat for 2 days in a row in a theater taking a test for the [facility] prior to the DVT.  He is on Xarelto and has seem hematology.  6 months of Xarelto were recommended.  He is up to date on his cancer screening.    Patient has been on Lasix 80 mg daily for several years now.  He has chronic exertional dyspnea.  He is short of breath after climbing steps or walking about 300 yards, especially up an incline.  No chest pain.  No orthopnea or PND.  BP has been running high, up to the 150s systolic at home.  Mildly elevated in the office today.   Labs (10/14): LDL 54, HDL 59 Labs (12/14): K 3.8, creatinine 0.9, HCT 40.3  PMH: 1. Carotid stenosis: Carotid dopplers (12/13) with 0-39% stenosis.  2. Hyperlipidemia 3. Gout 4. ITP: 5/10.  Thought to be due to Septra.  5. Obesity 6. OA 7. BPH 8. Nephrolithiasis 9. OSA 10. GERD 11. H/o melanoma 12. CAD: LHC (6/05) with EF 60%, 50% mLCx stenosis, 40% pLAD stenosis.  No intervention.  13. DVT (11/14): Spontaneous.  APLAS serologies negative.   14. Diastolic CHF  SH: Married, lives in [place], retired, 2 kids, quit smoking in 1968.   FH: h/o CVA and CAD  ROS: All systems reviewed and negative except as per HPI.   Current Outpatient Prescriptions  Medication Sig Dispense Refill  . allopurinol (ZYLOPRIM) 300 MG tablet TAKE 1 TABLET DAILY  90 tablet  2  . Cholecalciferol (VITAMIN D3) 2000 UNITS capsule Take 2,000 Units by mouth daily.        . Misc Natural Products (OSTEO BI-FLEX JOINT SHIELD PO) Take 2 tablets by mouth daily.       . Multiple Vitamin (MULTIVITAMIN) tablet Take 1  tablet by mouth daily.        . niacin (NIASPAN) 1000 MG CR tablet Take 1,000 mg by mouth at bedtime.      . nitroGLYCERIN (NITROSTAT) 0.4 MG SL tablet Place 1 tablet (0.4 mg total) under the tongue every 5 (five) minutes as needed. For chest pain  25 tablet  5  . Pitavastatin Calcium (LIVALO) 4 MG TABS Take 1 tablet by mouth at bedtime.      . Probiotic Product (PROBIOTIC & ACIDOPHILUS EX ST PO) Take 1 tablet by mouth daily.      . PSYLLIUM PO Take 20 mLs by mouth every evening. Powder. 4 teaspoonfuls every evening       . Rivaroxaban (XARELTO) 20 MG TABS tablet Take 1 tablet (20 mg total) by mouth daily.  90 tablet  1  . telmisartan (MICARDIS) 20 MG tablet Take 1 tablet (20 mg total) by mouth daily.  90 tablet  3  . furosemide (LASIX) 40 MG tablet TAKE 2 TABLETS (80 MG TOTAL) DAILY  180 tablet  0  . telmisartan (MICARDIS) 40 MG tablet Take 1 tablet (40 mg total) by mouth daily.  90 tablet  1   No current facility-administered medications for this visit.  BP 142/66  Pulse 76  Ht 6' (1.829 m)  Wt 129.729 kg (286 lb)  BMI 38.78 kg/m2 General: NAD, obese Neck: Thick, no JVD, no thyromegaly or thyroid nodule.  Lungs: Clear to auscultation bilaterally with normal respiratory effort. CV: Nondisplaced PMI.  Heart regular S1/S2, no S3/S4, no murmur. 1+ edema 1/2 to knee on right, 1+ edema to knee on left.  No carotid bruit.  Normal pedal pulses.  Abdomen: Soft, nontender, no hepatosplenomegaly, no distention.  Skin: Intact without lesions or rashes.  Neurologic: Alert and oriented x 3.  Psych: Normal affect. Extremities: No clubbing or cyanosis.   Assessment/Plan: 1. CAD: Nonobstructive on prior cath.  No chest pain.  He can stop ASA 81 while on Xarelto.  Restart when Xarelto is stopped (no benefit to the combination).  Continue statin.  2. Hyperlipidemia: Good lipids in 10/14.  Continue Livalo.  3. DVT: Possibly triggered by long hours sitting in a theater seat taking a test.  6 months of  Xarelto then repeat D dimer (seen by hematology).  Serologies for APLAS were negative.  4. Chronic diastolic CHF: Chronic NYHA class II-III symptoms.  JVP not elevated.  Continue current Lasix.  I will get an echo to assess LV systolic function.  I would like to see him exercise more.  He has an exercise bike at home he could use.  5. HTN: Increase telmisartan to 40 mg daily with BMET/BNP in 2 wks.   Followup in 6 months.   Marca Ancona 12/06/2013

## 2013-12-08 ENCOUNTER — Ambulatory Visit (HOSPITAL_COMMUNITY): Payer: Medicare Other | Attending: Cardiology | Admitting: Radiology

## 2013-12-08 ENCOUNTER — Other Ambulatory Visit: Payer: Self-pay | Admitting: *Deleted

## 2013-12-08 DIAGNOSIS — R0602 Shortness of breath: Secondary | ICD-10-CM | POA: Insufficient documentation

## 2013-12-08 DIAGNOSIS — R0609 Other forms of dyspnea: Secondary | ICD-10-CM | POA: Insufficient documentation

## 2013-12-08 DIAGNOSIS — I5032 Chronic diastolic (congestive) heart failure: Secondary | ICD-10-CM

## 2013-12-08 DIAGNOSIS — R609 Edema, unspecified: Secondary | ICD-10-CM

## 2013-12-08 DIAGNOSIS — R0989 Other specified symptoms and signs involving the circulatory and respiratory systems: Secondary | ICD-10-CM | POA: Insufficient documentation

## 2013-12-08 DIAGNOSIS — I509 Heart failure, unspecified: Secondary | ICD-10-CM

## 2013-12-08 NOTE — Progress Notes (Signed)
Echocardiogram performed.  

## 2013-12-22 ENCOUNTER — Other Ambulatory Visit (INDEPENDENT_AMBULATORY_CARE_PROVIDER_SITE_OTHER): Payer: Medicare Other

## 2013-12-22 DIAGNOSIS — I5032 Chronic diastolic (congestive) heart failure: Secondary | ICD-10-CM

## 2013-12-22 DIAGNOSIS — R0602 Shortness of breath: Secondary | ICD-10-CM

## 2013-12-22 LAB — BASIC METABOLIC PANEL
BUN: 14 mg/dL (ref 6–23)
CO2: 27 mEq/L (ref 19–32)
Chloride: 105 mEq/L (ref 96–112)
Creatinine, Ser: 0.9 mg/dL (ref 0.4–1.5)
Potassium: 4.1 mEq/L (ref 3.5–5.1)

## 2013-12-22 LAB — BRAIN NATRIURETIC PEPTIDE: Pro B Natriuretic peptide (BNP): 27 pg/mL (ref 0.0–100.0)

## 2013-12-25 ENCOUNTER — Encounter: Payer: Self-pay | Admitting: Family Medicine

## 2014-01-26 ENCOUNTER — Other Ambulatory Visit: Payer: Self-pay | Admitting: Cardiology

## 2014-02-16 ENCOUNTER — Other Ambulatory Visit: Payer: Self-pay | Admitting: Cardiology

## 2014-02-21 ENCOUNTER — Encounter: Payer: Self-pay | Admitting: Oncology

## 2014-02-23 ENCOUNTER — Other Ambulatory Visit: Payer: Self-pay | Admitting: Cardiology

## 2014-03-28 ENCOUNTER — Other Ambulatory Visit: Payer: Self-pay | Admitting: Family Medicine

## 2014-04-05 ENCOUNTER — Telehealth: Payer: Self-pay | Admitting: Family Medicine

## 2014-04-05 ENCOUNTER — Ambulatory Visit (INDEPENDENT_AMBULATORY_CARE_PROVIDER_SITE_OTHER): Payer: Medicare Other | Admitting: Family Medicine

## 2014-04-05 ENCOUNTER — Encounter: Payer: Self-pay | Admitting: Family Medicine

## 2014-04-05 VITALS — BP 110/60 | HR 68 | Temp 97.8°F | Wt 281.0 lb

## 2014-04-05 DIAGNOSIS — R7309 Other abnormal glucose: Secondary | ICD-10-CM

## 2014-04-05 DIAGNOSIS — R739 Hyperglycemia, unspecified: Secondary | ICD-10-CM

## 2014-04-05 DIAGNOSIS — I82409 Acute embolism and thrombosis of unspecified deep veins of unspecified lower extremity: Secondary | ICD-10-CM

## 2014-04-05 NOTE — Telephone Encounter (Signed)
Please call pt.  I talked to Dr. Beryle Beams.  It is okay to be on the xarelto when the labs are done.  I would take medicine for another month and then come in for labs w/o stopping the medicine.  We'll go from there.  Thanks.

## 2014-04-05 NOTE — Telephone Encounter (Signed)
Left detailed message on voicemail.  

## 2014-04-05 NOTE — Patient Instructions (Signed)
Recheck labs in about 1 month. I'll check with hematology in the meantime.   Take care.  Glad to see you.

## 2014-04-05 NOTE — Progress Notes (Signed)
Pre visit review using our clinic review tool, if applicable. No additional management support is needed unless otherwise documented below in the visit note.  H/o DVT.  On xarelto w/o ADE other than minor bruising.  No BP.  SOB at baseline.  Some BLE edema, unchanged. D/w pt about his hx and prev hematology recs.  See plan.   Meds, vitals, and allergies reviewed.   ROS: See HPI.  Otherwise, noncontributory.  GEN: nad, alert and oriented HEENT: mucous membranes moist NECK: supple w/o LA CV: rrr.  PULM: ctab, no inc wob ABD: soft, +bs SKIN: no acute rash, but some mild bruising noted on the arms.

## 2014-04-06 NOTE — Assessment & Plan Note (Signed)
Will check A1c with f/u labs.  He agrees.

## 2014-04-06 NOTE — Assessment & Plan Note (Signed)
Will check D dimer in about 1 month.  If wnl, then likely stop xarelto.  If anther episode he would likely need prolonged anticoagulation.  He agrees.  6 months is an appropriate duration of treatment.

## 2014-04-28 ENCOUNTER — Other Ambulatory Visit: Payer: Self-pay | Admitting: Cardiology

## 2014-05-05 ENCOUNTER — Other Ambulatory Visit (INDEPENDENT_AMBULATORY_CARE_PROVIDER_SITE_OTHER): Payer: Medicare Other

## 2014-05-05 DIAGNOSIS — I82409 Acute embolism and thrombosis of unspecified deep veins of unspecified lower extremity: Secondary | ICD-10-CM

## 2014-05-05 DIAGNOSIS — R7309 Other abnormal glucose: Secondary | ICD-10-CM

## 2014-05-05 DIAGNOSIS — R739 Hyperglycemia, unspecified: Secondary | ICD-10-CM

## 2014-05-05 LAB — HEMOGLOBIN A1C: HEMOGLOBIN A1C: 6.4 % (ref 4.6–6.5)

## 2014-05-06 ENCOUNTER — Other Ambulatory Visit: Payer: Self-pay | Admitting: Family Medicine

## 2014-05-06 LAB — D-DIMER, QUANTITATIVE: D-Dimer, Quant: 0.35 ug/mL-FEU (ref 0.00–0.48)

## 2014-05-23 DIAGNOSIS — D649 Anemia, unspecified: Secondary | ICD-10-CM

## 2014-05-23 DIAGNOSIS — Z9289 Personal history of other medical treatment: Secondary | ICD-10-CM

## 2014-05-23 DIAGNOSIS — D509 Iron deficiency anemia, unspecified: Secondary | ICD-10-CM

## 2014-05-23 HISTORY — DX: Personal history of other medical treatment: Z92.89

## 2014-05-23 HISTORY — DX: Anemia, unspecified: D64.9

## 2014-05-23 HISTORY — DX: Iron deficiency anemia, unspecified: D50.9

## 2014-05-26 ENCOUNTER — Telehealth: Payer: Self-pay | Admitting: *Deleted

## 2014-05-26 NOTE — Telephone Encounter (Signed)
Dr. Damita Dunnings asked that I call the patient because in looking ahead to the schedule on Friday afternoon, the patient is scheduled for SOB post blood clot and discontinuing Xarelto.  Dr. Damita Dunnings wanted to be sure this was not a situation of urgency that couldn't wait until Friday.  Patient says he has become more short of breath but over a period of time, not suddenly.  Patient was advised to keep appt on Friday but if the SOB became worse prior to that, seek medical attention at [facility] or ER.  Patient agreed.

## 2014-05-26 NOTE — Telephone Encounter (Signed)
Thanks

## 2014-05-27 ENCOUNTER — Encounter: Payer: Self-pay | Admitting: Family Medicine

## 2014-05-27 ENCOUNTER — Ambulatory Visit (INDEPENDENT_AMBULATORY_CARE_PROVIDER_SITE_OTHER): Payer: Medicare Other | Admitting: Family Medicine

## 2014-05-27 VITALS — BP 118/40 | HR 82 | Temp 97.8°F | Wt 281.5 lb

## 2014-05-27 DIAGNOSIS — R0602 Shortness of breath: Secondary | ICD-10-CM

## 2014-05-27 LAB — CBC WITH DIFFERENTIAL/PLATELET
BASOS ABS: 0.1 10*3/uL (ref 0.0–0.1)
Basophils Relative: 1 % (ref 0–1)
EOS PCT: 2 % (ref 0–5)
Eosinophils Absolute: 0.2 10*3/uL (ref 0.0–0.7)
HCT: 26.2 % — ABNORMAL LOW (ref 39.0–52.0)
Hemoglobin: 8.2 g/dL — ABNORMAL LOW (ref 13.0–17.0)
Lymphocytes Relative: 24 % (ref 12–46)
Lymphs Abs: 2 10*3/uL (ref 0.7–4.0)
MCH: 24.8 pg — ABNORMAL LOW (ref 26.0–34.0)
MCHC: 31.3 g/dL (ref 30.0–36.0)
MCV: 79.4 fL (ref 78.0–100.0)
Monocytes Absolute: 0.9 10*3/uL (ref 0.1–1.0)
Monocytes Relative: 11 % (ref 3–12)
Neutro Abs: 5.1 10*3/uL (ref 1.7–7.7)
Neutrophils Relative %: 62 % (ref 43–77)
PLATELETS: 282 10*3/uL (ref 150–400)
RBC: 3.3 MIL/uL — ABNORMAL LOW (ref 4.22–5.81)
RDW: 15.9 % — AB (ref 11.5–15.5)
WBC: 8.2 10*3/uL (ref 4.0–10.5)

## 2014-05-27 LAB — COMPREHENSIVE METABOLIC PANEL
ALBUMIN: 3.6 g/dL (ref 3.5–5.2)
ALK PHOS: 85 U/L (ref 39–117)
ALT: 30 U/L (ref 0–53)
AST: 24 U/L (ref 0–37)
BILIRUBIN TOTAL: 0.5 mg/dL (ref 0.2–1.2)
BUN: 12 mg/dL (ref 6–23)
CO2: 24 mEq/L (ref 19–32)
CREATININE: 0.89 mg/dL (ref 0.50–1.35)
Calcium: 8.8 mg/dL (ref 8.4–10.5)
Chloride: 105 mEq/L (ref 96–112)
Glucose, Bld: 102 mg/dL — ABNORMAL HIGH (ref 70–99)
Potassium: 4.1 mEq/L (ref 3.5–5.3)
Sodium: 139 mEq/L (ref 135–145)
Total Protein: 6 g/dL (ref 6.0–8.3)

## 2014-05-27 NOTE — Patient Instructions (Signed)
Take 3 lasix tomorrow and Sunday and then update me.  Go to the lab on the way out.  We'll contact you with your lab report. Take care.  Glad to see you. We'll be in touch.

## 2014-05-27 NOTE — Progress Notes (Signed)
Pre visit review using our clinic review tool, if applicable. No additional management support is needed unless otherwise documented below in the visit note.  More SOB with exertion.  Much more burping recently.  He is going to [place] next week.  Will be flying and asking about options for that, ie with xarelto before the trip.  He has some the med left, but is off it currently.    Appetite is decreased but weight is gradually increasing.  No CP.  Prev echo and BNP/d dimer reviewed.  Some days he has more swelling in his legs.  Laying on 1 small pillow.  Not SOB except with exertion.   Exercise tolerance is objectively decreased, in terms of distance walked.    Meds, vitals, and allergies reviewed.   ROS: See HPI.  Otherwise, noncontributory.  nad ncat Mmm rrr ctab abd soft, not ttp 2+ BLE noted.

## 2014-05-28 LAB — BRAIN NATRIURETIC PEPTIDE: BRAIN NATRIURETIC PEPTIDE: 69.3 pg/mL (ref 0.0–100.0)

## 2014-05-29 ENCOUNTER — Inpatient Hospital Stay (HOSPITAL_COMMUNITY)
Admission: EM | Admit: 2014-05-29 | Discharge: 2014-06-01 | DRG: 204 | Disposition: A | Discharge status: Home / Self Care | Payer: Medicare Other | Attending: Internal Medicine | Admitting: Internal Medicine

## 2014-05-29 ENCOUNTER — Encounter (HOSPITAL_COMMUNITY): Payer: Self-pay | Admitting: Emergency Medicine

## 2014-05-29 ENCOUNTER — Emergency Department (HOSPITAL_COMMUNITY): Payer: Medicare Other

## 2014-05-29 ENCOUNTER — Inpatient Hospital Stay (HOSPITAL_COMMUNITY): Payer: Medicare Other

## 2014-05-29 DIAGNOSIS — G4730 Sleep apnea, unspecified: Secondary | ICD-10-CM | POA: Diagnosis present

## 2014-05-29 DIAGNOSIS — Z86718 Personal history of other venous thrombosis and embolism: Secondary | ICD-10-CM

## 2014-05-29 DIAGNOSIS — G8929 Other chronic pain: Secondary | ICD-10-CM | POA: Diagnosis present

## 2014-05-29 DIAGNOSIS — D693 Immune thrombocytopenic purpura: Secondary | ICD-10-CM | POA: Diagnosis present

## 2014-05-29 DIAGNOSIS — R7309 Other abnormal glucose: Secondary | ICD-10-CM | POA: Diagnosis present

## 2014-05-29 DIAGNOSIS — I10 Essential (primary) hypertension: Secondary | ICD-10-CM | POA: Diagnosis present

## 2014-05-29 DIAGNOSIS — Z7982 Long term (current) use of aspirin: Secondary | ICD-10-CM

## 2014-05-29 DIAGNOSIS — Z9849 Cataract extraction status, unspecified eye: Secondary | ICD-10-CM

## 2014-05-29 DIAGNOSIS — E785 Hyperlipidemia, unspecified: Secondary | ICD-10-CM | POA: Diagnosis present

## 2014-05-29 DIAGNOSIS — F411 Generalized anxiety disorder: Secondary | ICD-10-CM | POA: Diagnosis present

## 2014-05-29 DIAGNOSIS — Z6839 Body mass index (BMI) 39.0-39.9, adult: Secondary | ICD-10-CM

## 2014-05-29 DIAGNOSIS — R609 Edema, unspecified: Secondary | ICD-10-CM

## 2014-05-29 DIAGNOSIS — D509 Iron deficiency anemia, unspecified: Secondary | ICD-10-CM | POA: Diagnosis present

## 2014-05-29 DIAGNOSIS — I5032 Chronic diastolic (congestive) heart failure: Secondary | ICD-10-CM | POA: Diagnosis present

## 2014-05-29 DIAGNOSIS — I82409 Acute embolism and thrombosis of unspecified deep veins of unspecified lower extremity: Secondary | ICD-10-CM

## 2014-05-29 DIAGNOSIS — R739 Hyperglycemia, unspecified: Secondary | ICD-10-CM

## 2014-05-29 DIAGNOSIS — R0602 Shortness of breath: Principal | ICD-10-CM | POA: Diagnosis present

## 2014-05-29 DIAGNOSIS — I509 Heart failure, unspecified: Secondary | ICD-10-CM | POA: Diagnosis present

## 2014-05-29 DIAGNOSIS — M549 Dorsalgia, unspecified: Secondary | ICD-10-CM | POA: Diagnosis present

## 2014-05-29 DIAGNOSIS — Z79899 Other long term (current) drug therapy: Secondary | ICD-10-CM

## 2014-05-29 DIAGNOSIS — Z85820 Personal history of malignant melanoma of skin: Secondary | ICD-10-CM

## 2014-05-29 DIAGNOSIS — Z87891 Personal history of nicotine dependence: Secondary | ICD-10-CM

## 2014-05-29 DIAGNOSIS — D696 Thrombocytopenia, unspecified: Secondary | ICD-10-CM

## 2014-05-29 DIAGNOSIS — E669 Obesity, unspecified: Secondary | ICD-10-CM | POA: Diagnosis present

## 2014-05-29 DIAGNOSIS — K219 Gastro-esophageal reflux disease without esophagitis: Secondary | ICD-10-CM | POA: Diagnosis present

## 2014-05-29 DIAGNOSIS — G589 Mononeuropathy, unspecified: Secondary | ICD-10-CM | POA: Diagnosis present

## 2014-05-29 DIAGNOSIS — I82819 Embolism and thrombosis of superficial veins of unspecified lower extremities: Secondary | ICD-10-CM | POA: Diagnosis present

## 2014-05-29 DIAGNOSIS — D649 Anemia, unspecified: Secondary | ICD-10-CM

## 2014-05-29 LAB — COMPREHENSIVE METABOLIC PANEL
ALK PHOS: 100 U/L (ref 39–117)
ALT: 33 U/L (ref 0–53)
AST: 35 U/L (ref 0–37)
Albumin: 3.4 g/dL — ABNORMAL LOW (ref 3.5–5.2)
BUN: 12 mg/dL (ref 6–23)
CHLORIDE: 101 meq/L (ref 96–112)
CO2: 22 mEq/L (ref 19–32)
Calcium: 9.3 mg/dL (ref 8.4–10.5)
Creatinine, Ser: 1.14 mg/dL (ref 0.50–1.35)
GFR calc Af Amer: 67 mL/min — ABNORMAL LOW (ref >90)
GFR calc non Af Amer: 58 mL/min — ABNORMAL LOW (ref >90)
Glucose, Bld: 139 mg/dL — ABNORMAL HIGH (ref 70–99)
POTASSIUM: 4.3 meq/L (ref 3.7–5.3)
Sodium: 139 mEq/L (ref 137–147)
Total Bilirubin: 0.4 mg/dL (ref 0.3–1.2)
Total Protein: 6.8 g/dL (ref 6.0–8.3)

## 2014-05-29 LAB — CBC WITH DIFFERENTIAL/PLATELET
BASOS ABS: 0 10*3/uL (ref 0.0–0.1)
Basophils Relative: 1 % (ref 0–1)
Eosinophils Absolute: 0.1 10*3/uL (ref 0.0–0.7)
Eosinophils Relative: 2 % (ref 0–5)
HCT: 26.3 % — ABNORMAL LOW (ref 39.0–52.0)
Hemoglobin: 7.9 g/dL — ABNORMAL LOW (ref 13.0–17.0)
LYMPHS PCT: 25 % (ref 12–46)
Lymphs Abs: 2 10*3/uL (ref 0.7–4.0)
MCH: 24.9 pg — ABNORMAL LOW (ref 26.0–34.0)
MCHC: 30 g/dL (ref 30.0–36.0)
MCV: 83 fL (ref 78.0–100.0)
Monocytes Absolute: 0.7 10*3/uL (ref 0.1–1.0)
Monocytes Relative: 9 % (ref 3–12)
NEUTROS ABS: 4.9 10*3/uL (ref 1.7–7.7)
Neutrophils Relative %: 63 % (ref 43–77)
Platelets: 264 10*3/uL (ref 150–400)
RBC: 3.17 MIL/uL — ABNORMAL LOW (ref 4.22–5.81)
RDW: 15.8 % — AB (ref 11.5–15.5)
WBC: 7.8 10*3/uL (ref 4.0–10.5)

## 2014-05-29 LAB — I-STAT TROPONIN, ED: Troponin i, poc: 0 ng/mL (ref 0.00–0.08)

## 2014-05-29 LAB — POC OCCULT BLOOD, ED: Fecal Occult Bld: NEGATIVE

## 2014-05-29 LAB — TSH: TSH: 5.05 u[IU]/mL — AB (ref 0.350–4.500)

## 2014-05-29 LAB — PRO B NATRIURETIC PEPTIDE: Pro B Natriuretic peptide (BNP): 36.3 pg/mL (ref 0–450)

## 2014-05-29 MED ORDER — ALLOPURINOL 300 MG PO TABS
300.0000 mg | ORAL_TABLET | Freq: Every day | ORAL | Status: DC
  Administered 2014-05-29 – 2014-06-01 (×4): 300 mg via ORAL
  Filled 2014-05-29 (×4): qty 1

## 2014-05-29 MED ORDER — IRBESARTAN 75 MG PO TABS
75.0000 mg | ORAL_TABLET | Freq: Every day | ORAL | Status: DC
  Administered 2014-05-30 – 2014-06-01 (×3): 75 mg via ORAL
  Filled 2014-05-29 (×3): qty 1

## 2014-05-29 MED ORDER — ACETAMINOPHEN 325 MG PO TABS: 650.0000 mg | ORAL_TABLET | Freq: Four times a day (QID) | ORAL | Status: DC | PRN

## 2014-05-29 MED ORDER — FUROSEMIDE 10 MG/ML IJ SOLN
60.0000 mg | Freq: Once | INTRAMUSCULAR | Status: AC
  Administered 2014-05-29: 60 mg via INTRAVENOUS
  Filled 2014-05-29: qty 6

## 2014-05-29 MED ORDER — ONDANSETRON HCL 4 MG PO TABS: 4.0000 mg | ORAL_TABLET | Freq: Four times a day (QID) | ORAL | Status: DC | PRN

## 2014-05-29 MED ORDER — SODIUM CHLORIDE 0.9 % IV SOLN: 250.0000 mL | INTRAVENOUS | Status: DC | PRN

## 2014-05-29 MED ORDER — PANTOPRAZOLE SODIUM 40 MG PO TBEC
40.0000 mg | DELAYED_RELEASE_TABLET | Freq: Two times a day (BID) | ORAL | Status: DC
  Administered 2014-05-29 – 2014-06-01 (×6): 40 mg via ORAL
  Filled 2014-05-29 (×5): qty 1

## 2014-05-29 MED ORDER — ALUM & MAG HYDROXIDE-SIMETH 200-200-20 MG/5ML PO SUSP: 30.0000 mL | Freq: Four times a day (QID) | ORAL | Status: DC | PRN

## 2014-05-29 MED ORDER — ATORVASTATIN CALCIUM 20 MG PO TABS
20.0000 mg | ORAL_TABLET | Freq: Every day | ORAL | Status: DC
  Administered 2014-05-29 – 2014-05-31 (×3): 20 mg via ORAL
  Filled 2014-05-29 (×4): qty 1

## 2014-05-29 MED ORDER — SODIUM CHLORIDE 0.9 % IJ SOLN: 3.0000 mL | INTRAMUSCULAR | Status: DC | PRN

## 2014-05-29 MED ORDER — ONDANSETRON HCL 4 MG/2ML IJ SOLN: 4.0000 mg | Freq: Four times a day (QID) | INTRAMUSCULAR | Status: DC | PRN

## 2014-05-29 MED ORDER — IOHEXOL 350 MG/ML SOLN
100.0000 mL | Freq: Once | INTRAVENOUS | Status: AC | PRN
  Administered 2014-05-29: 100 mL via INTRAVENOUS

## 2014-05-29 MED ORDER — NITROGLYCERIN 0.4 MG SL SUBL: 0.4000 mg | SUBLINGUAL_TABLET | SUBLINGUAL | Status: DC | PRN

## 2014-05-29 MED ORDER — ACETAMINOPHEN 650 MG RE SUPP: 650.0000 mg | Freq: Four times a day (QID) | RECTAL | Status: DC | PRN

## 2014-05-29 MED ORDER — SODIUM CHLORIDE 0.9 % IJ SOLN
3.0000 mL | Freq: Two times a day (BID) | INTRAMUSCULAR | Status: DC
  Administered 2014-05-29 – 2014-05-31 (×4): 3 mL via INTRAVENOUS

## 2014-05-29 MED ORDER — FUROSEMIDE 80 MG PO TABS
80.0000 mg | ORAL_TABLET | Freq: Every day | ORAL | Status: DC
  Administered 2014-05-30 – 2014-06-01 (×3): 80 mg via ORAL
  Filled 2014-05-29 (×3): qty 1

## 2014-05-29 MED ORDER — SODIUM CHLORIDE 0.9 % IJ SOLN
3.0000 mL | Freq: Two times a day (BID) | INTRAMUSCULAR | Status: DC
  Administered 2014-05-29 – 2014-05-31 (×3): 3 mL via INTRAVENOUS

## 2014-05-29 NOTE — ED Provider Notes (Signed)
CSN: [id number]     Arrival date & time 05/29/14  1325 History   First MD Initiated Contact with Patient 05/29/14 1342     Chief Complaint  Patient presents with  . Shortness of Breath     (Consider location/radiation/quality/duration/timing/severity/associated sxs/prior Treatment) Patient is a 80 y.o. male presenting with shortness of breath.  Shortness of Breath Associated symptoms: no cough   80 y.o. Male with increasing dyspnea over a month.  Seen by his pmd several days ago.  His doctor called him and told him his blood count was low and to come in to the ed.  He denies chest pain.  He denies dark or bloody stool.  He was on pradexa for a blood clot  Past Medical History  Diagnosis Date  . Bradycardia     Hypertensive  . carotid bruit, right   . hypercholesterolemia     Trig 234/293;takes Simcor daily  . Obesity, morbid   . Vasculitis   . Immune thrombocytopenic purpura     Dr. Beryle Beams  . Dermatitis     legs  . Blood in stool   . Bleeding gums   . Chronic low back pain   . Myalgia and myositis   . Unspecified vitamin D deficiency   . Bladder diverticulum   . Primary osteoarthritis of both knees   . Anxiety   . Nephrolithiasis   . History of elevated glucose   . Thrombophlebitis     right forearm  . History of BPH   . History of colonic polyps     [name]  . Epididymitis, left     S/P Epidimectomy  . Pneumonia, bacterial     RML resolved, spirometry, normal  . History of ITP 2011    per Dr. Beryle Beams 2011, normal platelets  . Thrombocytopenia 05/20-05/27/10    Hosp ITP severe, Dr. Beryle Beams  . Splenomegaly 05/15/09    abd U/S NML  . Hypertension, benign     takes Micardis daily  . Sleep apnea     sitting/lying/exertion  . Neuropathy     acute  . Arthritis     hands  . Bruises easily   . H/O hiatal hernia   . GERD (gastroesophageal reflux disease)     hx of but doesn't take any meds now  . Urinary frequency   . Urinary urgency   .  Nocturia   . Urinary leakage   . Malignant melanoma of ear     right  . Abnormality of gait 03/19/2013  . DVT of lower limb, acute 11/19/2013    Peroneal & distal tibial left 11/01/13   Past Surgical History  Procedure Laterality Date  . Kidney stone retrieval  1950's  . Fem cutaneous nerve entrapment  1977    due to surgery (mild lat anesth of bilat legs)  . Herniorraphy      x 2 right  . Limited pelvis/hip bone scan  04/99    negative  . Bone scan  04/00  . Cataract extraction  02/04    right  . Cataract extraction  02/04    righ  . Wide local excision, right ear  06/04    melanoma with flap  . Stress cardiolite  05/11/04    mild inf. ischemia, EF 71%  . Left shoulder surgery  06/17/2005  . Bronchoscopy  09/08    RML collapse with chronic pneumonia, bx neg (Dr. Joya Gaskins)  . Right middle finger cyst excision  03/07/09    DIP  Joint Mucoid (Dr. Fredna Dow)  . Eyelid & eyebrow lift  1996  . Right middle finger cyst reexcision  08/15/09    DIP Joint Mucoid Cyst (Dr. Fredna Dow)  . Appendectomy  1950  . Hernia repair  1959    x 2  . Excision of mucoid tumor    . Cardiac catheterization  05/23/04    mild plague-statin  . Colonoscopy    . Total knee arthroplasty  01/10/2012    Procedure: TOTAL KNEE ARTHROPLASTY;  Surgeon: Augustin Schooling, MD;  Location: [place];  Service: Orthopedics;  Laterality: Left;  . Testicle mass      removal of mass, not removal of testicle, noncancerous   Family History  Problem Relation Age of Onset  . Cancer Mother     thyroid  . Diabetes Father   . Heart disease Father     MI  . Stroke Brother     cerebral hemm  . Cancer Other     colon, ? brother  . Anesthesia problems Neg Hx   . Hypotension Neg Hx   . Malignant hyperthermia Neg Hx   . Pseudochol deficiency Neg Hx   . Prostate cancer Neg Hx   . Colon cancer Brother    History  Substance Use Topics  . Smoking status: Former Smoker -- 2.00 packs/day for 20 years    Quit date: 09/27/1967  .  Smokeless tobacco: Never Used     Comment: Quit in 1968  . Alcohol Use: No    Review of Systems  Constitutional: Positive for fatigue.  HENT: Negative.   Eyes: Negative.   Respiratory: Positive for shortness of breath. Negative for cough and chest tightness.   Cardiovascular: Positive for leg swelling.  Gastrointestinal: Negative.   Endocrine: Negative.   Genitourinary: Negative.   Musculoskeletal: Negative.   Allergic/Immunologic: Negative.   Neurological: Negative.   Hematological: Negative.   Psychiatric/Behavioral: Negative.       Allergies  Penicillins; Rofecoxib; Sulfamethoxazole-trimethoprim; Tramadol hcl; and Neosporin  Home Medications   Prior to Admission medications   Medication Sig Start Date End Date Taking? Authorizing Provider  allopurinol (ZYLOPRIM) 300 MG tablet Take 300 mg by mouth daily.   Yes Historical Provider, MD  aspirin 81 MG tablet Take 81 mg by mouth daily.   Yes Historical Provider, MD  Cholecalciferol (VITAMIN D3) 2000 UNITS capsule Take 2,000 Units by mouth daily.     Yes Historical Provider, MD  furosemide (LASIX) 40 MG tablet Take 80 mg by mouth daily.   Yes Historical Provider, MD  Misc Natural Products (OSTEO BI-FLEX JOINT SHIELD PO) Take 2 tablets by mouth daily.    Yes Historical Provider, MD  Multiple Vitamin (MULTIVITAMIN) tablet Take 1 tablet by mouth daily.     Yes Historical Provider, MD  niacin (NIASPAN) 1000 MG CR tablet Take 1,000 mg by mouth at bedtime.   Yes Historical Provider, MD  nitroGLYCERIN (NITROSTAT) 0.4 MG SL tablet Place 1 tablet (0.4 mg total) under the tongue every 5 (five) minutes as needed. For chest pain 12/06/13  Yes Larey Dresser, MD  Pitavastatin Calcium (LIVALO) 4 MG TABS Take 4 mg by mouth daily.   Yes Historical Provider, MD  Probiotic Product (PROBIOTIC & ACIDOPHILUS EX ST PO) Take 1 tablet by mouth daily.   Yes Historical Provider, MD  PSYLLIUM PO Take 20 mLs by mouth every evening. Powder. 4 teaspoonfuls  every evening    Yes Historical Provider, MD  telmisartan (MICARDIS) 40 MG tablet Take 40  mg by mouth daily.   Yes Historical Provider, MD   BP 113/60  Pulse 71  Temp(Src) 97.9 F (36.6 C) (Oral)  Resp 15  SpO2 98% Physical Exam  Nursing note and vitals reviewed. Constitutional: He is oriented to person, place, and time. He appears well-developed and well-nourished.  HENT:  Head: Normocephalic and atraumatic.  Right Ear: External ear normal.  Left Ear: External ear normal.  Nose: Nose normal.  Mouth/Throat: Oropharynx is clear and moist.  Eyes: Pupils are equal, round, and reactive to light.  Conjunctiva pale  Neck: Normal range of motion. Neck supple.  Cardiovascular: Normal rate, regular rhythm, normal heart sounds and intact distal pulses.   Pulmonary/Chest: Effort normal and breath sounds normal.  Abdominal: Soft.  Musculoskeletal: He exhibits no edema and no tenderness.  No edema, tenderness, cords, or redness in bilateral lower extremities  Neurological: He is alert and oriented to person, place, and time. He has normal reflexes.  Skin: Skin is warm and dry.  Psychiatric: He has a normal mood and affect. His behavior is normal. Judgment and thought content normal.    ED Course  Procedures (including critical care time) Labs Review Labs Reviewed  CBC WITH DIFFERENTIAL - Abnormal; Notable for the following:    RBC 3.17 (*)    Hemoglobin 7.9 (*)    HCT 26.3 (*)    MCH 24.9 (*)    RDW 15.8 (*)    All other components within normal limits  COMPREHENSIVE METABOLIC PANEL - Abnormal; Notable for the following:    Glucose, Bld 139 (*)    Albumin 3.4 (*)    GFR calc non Af Amer 58 (*)    GFR calc Af Amer 67 (*)    All other components within normal limits  OCCULT BLOOD X 1 CARD TO LAB, STOOL  I-STAT TROPOININ, ED  TYPE AND SCREEN    Imaging Review Dg Chest Port 1 View  05/29/2014   CLINICAL DATA:  Shortness of Breath  EXAM: PORTABLE CHEST - 1 VIEW  COMPARISON:   December 31, 2011  FINDINGS: There is underlying emphysematous change. There is no edema or consolidation. Heart is mildly enlarged with normal pulmonary vascularity. No pneumothorax. There is atherosclerotic change in the aorta. No adenopathy.  IMPRESSION: Underlying emphysema. Mild cardiac enlargement. No edema or consolidation.   Electronically Signed   By: Lowella Grip M.D.   On: 05/29/2014 15:04     EKG Interpretation   Date/Time:  Sunday May 29 2014 14:31:53 EDT Ventricular Rate:  70 PR Interval:  201 QRS Duration: 94 QT Interval:  404 QTC Calculation: 436 R Axis:   24 Text Interpretation:  Normal sinus rhythm Confirmed by Jihan Rudy MD, Andee Poles  [phone]) on 05/29/2014 3:36:59 PM      MDM  80 year old male who presents today from finding of symptomatic anemia. He does not have any obvious active bleeding. Hemoccult is being done. Plan admission for further treatment.  Shaune Pollack, MD 05/30/14 [phone]

## 2014-05-29 NOTE — Assessment & Plan Note (Addendum)
I expected this to be a CHF issue.  I had the patient inc his lasix from 2 to 3 tabs a day over the weekend.  I just saw his labs re: anemia and called the patient. He didn't sound toxic but was more SOB and was feeling worse than prev on Friday.  I advised him to go to ER.  He chose [facility].  I called ahead and talked to ER triage.  He hasn't had black stools, bleeding, etc.  He is not on coumadin/xarelto currently.  >25 minutes spent in face to face time with patient, >50% spent in counselling or coordination of care.

## 2014-05-29 NOTE — ED Notes (Signed)
Placed pt on 2L per Dr Jeanell Sparrow.

## 2014-05-29 NOTE — ED Notes (Signed)
C/o sob with lightheadedness  When bending over denies chest pain recently on xarelto for "clot in left leg" was called by his pmd and told to come to er for hgb of 8 pt denies any bleeding incl denies blood in stools

## 2014-05-29 NOTE — H&P (Signed)
[facility] History and Physical  James [name] KXF:[id number] DOB: 1928-04-19 [name]: 05/29/2014  Referring physician:  PCP: [name], MD   Chief Complaint: Shortness of breath  HPI: James [name] is a 80 y.o. male with a past medical history of deep venous thrombosis diagnosed November of 2014 treated with 5-6 months of anticoagulation with Xarelto, stopped 4-6 weeks ago, history of diastolic congestive heart failure with last transthoracic echocardiogram performed on 12/08/2013 that showed an ejection fraction of 60-65% with grade 1 diastolic dysfunction, presenting to the emergency room with complaints of shortness of breath. He reports having shortness of breath shortly after the discontinuation of Xarelto, progressively worsening over the past month. He also reports poor tolerance to physical exertion, becoming dyspneic ambulating around his house. He denies purulent sputum production, fevers, chills, nausea, vomiting, palpitations, hemoptysis or chest pain. Patient also reporting weight gain with associated increasing bilateral extremity swelling. He is currently on Lasix 80 mg by mouth daily, but was instructed to take 120 mg by mouth daily this weekend for increased swelling. Lab work from 05/27/2014 showed a BNP of 69.3. Chest x-ray on today's visit revealed underlying emphysema without pulmonary edema or consolidation. Labs did show a hemoglobin of 7.9, however he was found to be guaiac-negative in the emergency room. He denies black tarry stools, bright red blood, hemoptysis or hematemesis. It appears he had a colonoscopy performed in 2010 which came back as normal. No evidence of polyps, AVMs, colitis, tumors, Crohn's or diverticulosis from ascending colon to rectum.                                                                                                                                                                                                                                                 Review of Systems:  Constitutional:  No weight loss, night sweats, Fevers, chills, positive for fatigue.  HEENT:  No headaches, Difficulty swallowing,Tooth/dental problems,Sore throat,  No sneezing, itching, ear ache, nasal congestion, post nasal drip,  Cardio-vascular:  No chest pain, Orthopnea, PND, Positive for swelling in lower extremities, anasarca, dizziness, palpitations  GI:  No heartburn, indigestion, abdominal pain, nausea, vomiting, diarrhea, change in bowel habits, loss of appetite  Resp:  Positive for shortness of breath with exertion or at rest. No excess mucus, no productive cough, No non-productive cough, No coughing up of blood.No  change in color of mucus.No wheezing.No chest wall deformity  Skin:  no rash or lesions.  GU:  no dysuria, change in color of urine, no urgency or frequency. No flank pain.  Musculoskeletal:  No joint pain or swelling. No decreased range of motion. No back pain.  Psych:  No change in mood or affect. No depression or anxiety. No memory loss.   Past Medical History  Diagnosis Date  . Bradycardia     Hypertensive  . carotid bruit, right   . hypercholesterolemia     Trig 234/293;takes Simcor daily  . Obesity, morbid   . Vasculitis   . Immune thrombocytopenic purpura     Dr. Beryle Beams  . Dermatitis     legs  . Blood in stool   . Bleeding gums   . Chronic low back pain   . Myalgia and myositis   . Unspecified vitamin D deficiency   . Bladder diverticulum   . Primary osteoarthritis of both knees   . Anxiety   . Nephrolithiasis   . History of elevated glucose   . Thrombophlebitis     right forearm  . History of BPH   . History of colonic polyps     [name]  . Epididymitis, left     S/P Epidimectomy  . Pneumonia, bacterial     RML resolved, spirometry, normal  . History of ITP 2011    per Dr. Beryle Beams 2011, normal platelets  . Thrombocytopenia 05/20-05/27/10    Hosp ITP severe, Dr. Beryle Beams  .  Splenomegaly 05/15/09    abd U/S NML  . Hypertension, benign     takes Micardis daily  . Sleep apnea     sitting/lying/exertion  . Neuropathy     acute  . Arthritis     hands  . Bruises easily   . H/O hiatal hernia   . GERD (gastroesophageal reflux disease)     hx of but doesn't take any meds now  . Urinary frequency   . Urinary urgency   . Nocturia   . Urinary leakage   . Malignant melanoma of ear     right  . Abnormality of gait 03/19/2013  . DVT of lower limb, acute 11/19/2013    Peroneal & distal tibial left 11/01/13   Past Surgical History  Procedure Laterality Date  . Kidney stone retrieval  1950's  . Fem cutaneous nerve entrapment  1977    due to surgery (mild lat anesth of bilat legs)  . Herniorraphy      x 2 right  . Limited pelvis/hip bone scan  04/99    negative  . Bone scan  04/00  . Cataract extraction  02/04    right  . Cataract extraction  02/04    righ  . Wide local excision, right ear  06/04    melanoma with flap  . Stress cardiolite  05/11/04    mild inf. ischemia, EF 71%  . Left shoulder surgery  06/17/2005  . Bronchoscopy  09/08    RML collapse with chronic pneumonia, bx neg (Dr. Joya Gaskins)  . Right middle finger cyst excision  03/07/09    DIP Joint Mucoid (Dr. Fredna Dow)  . Eyelid & eyebrow lift  1996  . Right middle finger cyst reexcision  08/15/09    DIP Joint Mucoid Cyst (Dr. Fredna Dow)  . Appendectomy  1950  . Hernia repair  1959    x 2  . Excision of mucoid tumor    . Cardiac catheterization  05/23/04  mild plague-statin  . Colonoscopy    . Total knee arthroplasty  01/10/2012    Procedure: TOTAL KNEE ARTHROPLASTY;  Surgeon: Augustin Schooling, MD;  Location: [place];  Service: Orthopedics;  Laterality: Left;  . Testicle mass      removal of mass, not removal of testicle, noncancerous   Social History:  reports that he quit smoking about 46 years ago. He has never used smokeless tobacco. He reports that he does not drink alcohol or use illicit  drugs.  Allergies  Allergen Reactions  . Penicillins     REACTION: Rash, swelling  . Rofecoxib   . Sulfamethoxazole-Trimethoprim     REACTION: questional onset of low platelets and bleeding  . Tramadol Hcl     REACTION: dizziness, drowsiness  . Neosporin [Neomycin-Polymyxin-Gramicidin] Rash    Family History  Problem Relation Age of Onset  . Cancer Mother     thyroid  . Diabetes Father   . Heart disease Father     MI  . Stroke Brother     cerebral hemm  . Cancer Other     colon, ? brother  . Anesthesia problems Neg Hx   . Hypotension Neg Hx   . Malignant hyperthermia Neg Hx   . Pseudochol deficiency Neg Hx   . Prostate cancer Neg Hx   . Colon cancer Brother      Prior to Admission medications   Medication Sig Start Date End Date Taking? Authorizing Provider  allopurinol (ZYLOPRIM) 300 MG tablet Take 300 mg by mouth daily.   Yes Historical Provider, MD  aspirin 81 MG tablet Take 81 mg by mouth daily.   Yes Historical Provider, MD  Cholecalciferol (VITAMIN D3) 2000 UNITS capsule Take 2,000 Units by mouth daily.     Yes Historical Provider, MD  furosemide (LASIX) 40 MG tablet Take 80 mg by mouth daily.   Yes Historical Provider, MD  Misc Natural Products (OSTEO BI-FLEX JOINT SHIELD PO) Take 2 tablets by mouth daily.    Yes Historical Provider, MD  Multiple Vitamin (MULTIVITAMIN) tablet Take 1 tablet by mouth daily.     Yes Historical Provider, MD  niacin (NIASPAN) 1000 MG CR tablet Take 1,000 mg by mouth at bedtime.   Yes Historical Provider, MD  nitroGLYCERIN (NITROSTAT) 0.4 MG SL tablet Place 1 tablet (0.4 mg total) under the tongue every 5 (five) minutes as needed. For chest pain 12/06/13  Yes Larey Dresser, MD  Pitavastatin Calcium (LIVALO) 4 MG TABS Take 4 mg by mouth daily.   Yes Historical Provider, MD  Probiotic Product (PROBIOTIC & ACIDOPHILUS EX ST PO) Take 1 tablet by mouth daily.   Yes Historical Provider, MD  PSYLLIUM PO Take 20 mLs by mouth every evening.  Powder. 4 teaspoonfuls every evening    Yes Historical Provider, MD  telmisartan (MICARDIS) 40 MG tablet Take 40 mg by mouth daily.   Yes Historical Provider, MD   Physical Exam: Filed Vitals:   05/29/14 1545  BP: 132/71  Pulse: 67  Temp:   Resp: 20    BP 132/71  Pulse 67  Temp(Src) 97.9 F (36.6 C) (Oral)  Resp 20  SpO2 99%  General:  Appears calm and comfortable, no acute distress. Calm and cooperative Eyes: PERRL, normal lids, irises & conjunctiva ENT: grossly normal hearing, lips & tongue Neck: no LAD, masses or thyromegaly Cardiovascular: RRR, no m/r/g. Has 1 + bilateral pitting edema. Telemetry: SR, no arrhythmias  Respiratory: CTA bilaterally, no w/r/r. Normal respiratory effort. Abdomen:  soft, ntnd Skin: no rash or induration seen on limited exam Musculoskeletal: 1 + Bilateral pitting edema Psychiatric: grossly normal mood and affect, speech fluent and appropriate Neurologic: grossly non-focal.          Labs on Admission:  Basic Metabolic Panel:  Recent Labs Lab 05/27/14 1612 05/29/14 1410  NA 139 139  K 4.1 4.3  CL 105 101  CO2 24 22  GLUCOSE 102* 139*  BUN 12 12  CREATININE 0.89 1.14  CALCIUM 8.8 9.3   Liver Function Tests:  Recent Labs Lab 05/27/14 1612 05/29/14 1410  AST 24 35  ALT 30 33  ALKPHOS 85 100  BILITOT 0.5 0.4  PROT 6.0 6.8  ALBUMIN 3.6 3.4*   No results found for this basename: LIPASE, AMYLASE,  in the last 168 hours No results found for this basename: AMMONIA,  in the last 168 hours CBC:  Recent Labs Lab 05/27/14 1612 05/29/14 1410  WBC 8.2 7.8  NEUTROABS 5.1 4.9  HGB 8.2* 7.9*  HCT 26.2* 26.3*  MCV 79.4 83.0  PLT 282 264   Cardiac Enzymes: No results found for this basename: CKTOTAL, CKMB, CKMBINDEX, TROPONINI,  in the last 168 hours  BNP (last 3 results)  Recent Labs  12/06/13 1021 12/22/13 0741  PROBNP 34.0 27.0   CBG: No results found for this basename: GLUCAP,  in the last 168 hours  Radiological  Exams on Admission: Dg Chest Port 1 View  05/29/2014   CLINICAL DATA:  Shortness of Breath  EXAM: PORTABLE CHEST - 1 VIEW  COMPARISON:  December 31, 2011  FINDINGS: There is underlying emphysematous change. There is no edema or consolidation. Heart is mildly enlarged with normal pulmonary vascularity. No pneumothorax. There is atherosclerotic change in the aorta. No adenopathy.  IMPRESSION: Underlying emphysema. Mild cardiac enlargement. No edema or consolidation.   Electronically Signed   By: Lowella Grip M.D.   On: 05/29/2014 15:04    EKG: Independently reviewed. Sinus rhythm  Assessment/Plan Principal Problem:   Shortness of breath Active Problems:   DVT of lower limb, acute   HYPERTENSION, BENIGN   Chronic diastolic CHF (congestive heart failure)   Anemia   SOB (shortness of breath)   1. Shortness of breath. Unclear etiology. He presents with progressive shortness of breath over the past month. Initial workup included a chest x-ray which was unremarkable. He had a BNP done several days ago which came back at 69.3. It appears she was treated for a DVT diagnosed back in November of 2014 with Xarelto which was discontinued 4-6 weeks ago. I am not sure if hemoglobin of 7.9 is the cause of his shortness of breath. He did report weight gain with increased bilateral extremity edema. Will check a CT scan of lungs with IV contrast a assess for pulmonary embolism. Will administer a single dose of Lasix 60 mg IV x1 this evening, and continue his home regimen of 80 mg by mouth daily tomorrow. Will follow kidney function closely.  2. Anemia. Lab work showing a drop in his hemoglobin from 13 on 11/24/2013 to 7.9 on today's lab work. MCV was normal at 83. He denies bright red blood per rectum, hematemesis, black tarry stools as his stool was Hemoccult negative in the emergency room. Plan to repeat a CBC tomorrow morning to follow trends. This could be related to anemia of chronic disease. Will check iron  studies and repeat a stool guaiac. For now will hold off on aspirin therapy until repeat stool Hemoccult  as available. 3. Chronic diastolic congestive heart failure. Status post transthoracic echocardiogram on 12/08/2013 which showed a preserved ejection fraction of 60-65% without wall motion abnormalities and grade 1 diastolic dysfunction. As mentioned above, patient reporting increased weight with increase in lower extremity edema, will administer 60 mg of IV Lasix this evening, followed his volume status. 4. History of deep venous thrombosis. Patient complaining anticoagulation therapy about a month ago. Now presenting with complaints of increasing shortness of breath. Checking CT scan of lungs with IV contrast to assess for pulmonary embolism. 5. Hypertension. Continue ARB therapy 6. DVT prophylaxis. Given drop in patient's hemoglobin to 7.9, will place him on SCDs for now and followup on a repeat stool Hemoccult. I am concerned about possibility of GI bleed. Reassess in am.    Code Status: Full Code Family Communication: Spoke to his daughter at bedside Disposition Plan: Will admit to telemetry, anticipate he may require greater than 2 nights hospitalization  Time spent: 70 min  [facility] Pager [phone]  **Disclaimer: This note may have been dictated with voice recognition software. Similar sounding words can inadvertently be transcribed and this note may contain transcription errors which may not have been corrected upon publication of note.**

## 2014-05-30 DIAGNOSIS — D649 Anemia, unspecified: Secondary | ICD-10-CM

## 2014-05-30 DIAGNOSIS — I509 Heart failure, unspecified: Secondary | ICD-10-CM

## 2014-05-30 DIAGNOSIS — R680 Hypothermia, not associated with low environmental temperature: Secondary | ICD-10-CM

## 2014-05-30 DIAGNOSIS — I5032 Chronic diastolic (congestive) heart failure: Secondary | ICD-10-CM

## 2014-05-30 LAB — IRON AND TIBC
Iron: 30 ug/dL — ABNORMAL LOW (ref 42–135)
Saturation Ratios: 9 % — ABNORMAL LOW (ref 20–55)
TIBC: 338 ug/dL (ref 215–435)
UIBC: 308 ug/dL (ref 125–400)

## 2014-05-30 LAB — BASIC METABOLIC PANEL
BUN: 13 mg/dL (ref 6–23)
CALCIUM: 9.1 mg/dL (ref 8.4–10.5)
CO2: 23 mEq/L (ref 19–32)
Chloride: 101 mEq/L (ref 96–112)
Creatinine, Ser: 0.93 mg/dL (ref 0.50–1.35)
GFR calc Af Amer: 88 mL/min — ABNORMAL LOW (ref >90)
GFR calc non Af Amer: 76 mL/min — ABNORMAL LOW (ref >90)
GLUCOSE: 154 mg/dL — AB (ref 70–99)
Potassium: 3.8 mEq/L (ref 3.7–5.3)
Sodium: 140 mEq/L (ref 137–147)

## 2014-05-30 LAB — CBC
HEMATOCRIT: 27.8 % — AB (ref 39.0–52.0)
Hemoglobin: 8.2 g/dL — ABNORMAL LOW (ref 13.0–17.0)
MCH: 24.3 pg — AB (ref 26.0–34.0)
MCHC: 29.5 g/dL — ABNORMAL LOW (ref 30.0–36.0)
MCV: 82.2 fL (ref 78.0–100.0)
Platelets: 281 10*3/uL (ref 150–400)
RBC: 3.38 MIL/uL — ABNORMAL LOW (ref 4.22–5.81)
RDW: 15.9 % — ABNORMAL HIGH (ref 11.5–15.5)
WBC: 7.3 10*3/uL (ref 4.0–10.5)

## 2014-05-30 LAB — PREPARE RBC (CROSSMATCH)

## 2014-05-30 LAB — FERRITIN: Ferritin: 10 ng/mL — ABNORMAL LOW (ref 22–322)

## 2014-05-30 MED ORDER — FUROSEMIDE 10 MG/ML IJ SOLN
20.0000 mg | Freq: Once | INTRAMUSCULAR | Status: AC
  Administered 2014-05-30: 20 mg via INTRAVENOUS
  Filled 2014-05-30: qty 2

## 2014-05-30 NOTE — Progress Notes (Signed)
Attempted to flush pt's iv on Rt. PFA noted it to be leaking when flushed.  Will get new iv placed before 1 UPRBC tx.  Will continue to monitor.  Karie Kirks, Therapist, sports.

## 2014-05-30 NOTE — Progress Notes (Signed)
UR completed Tim Corriher K. Tobey Lippard, RN, BSN, [place], CCM  05/30/2014 12:52 PM

## 2014-05-30 NOTE — Progress Notes (Signed)
Nutrition Brief Note   Patient identified on the Malnutrition Screening Tool (MST) Report   Wt Readings from Last 15 Encounters:  05/30/14 272 lb 1.6 oz (123.424 kg)  05/27/14 281 lb 8 oz (127.688 kg)  04/05/14 281 lb (127.461 kg)  12/06/13 286 lb (129.729 kg)  12/01/13 285 lb (129.275 kg)  11/05/13 279 lb (126.554 kg)  10/29/13 265 lb 8 oz (120.43 kg)  09/20/13 276 lb (125.193 kg)  05/28/13 275 lb 12 oz (125.079 kg)  03/19/13 275 lb (124.739 kg)  12/07/12 270 lb (122.471 kg)  12/08/12 277 lb (125.646 kg)  11/27/12 280 lb (127.007 kg)  09/07/12 274 lb (124.286 kg)  08/04/12 273 lb 12 oz (124.172 kg)    Body mass index is 39.04 kg/(m^2). Patient meets criteria for Obesity Class II based on current BMI.   Current diet order is Heart Healthy, patient is consuming approximately 100% of meals at this time. Labs and medications reviewed.   Pt stated that he hasn't been able to eat well recently because of the fluid he has been accumulating. Pt's appetite is better today and patient is eating 100%. Encouraged patient to restrict sodium intake at home.   No nutrition interventions warranted at this time. If nutrition issues arise, please consult RD.   Carrolyn Leigh, BS Dietetic Intern Pager: [phone]    I agree with student dietitian note; appropriate revisions have been made.  Molli Barrows, RD, LDN, [place] Pager# [phone] After Hours Pager# [phone]

## 2014-05-30 NOTE — Progress Notes (Signed)
[facility] PROGRESS NOTE  James [name] QMV:[id number] DOB: 09-25-1928 [name]: 05/29/2014 PCP: [name], MD  Assessment/Plan: 1-SOB especially on exertion: no CP.  -Cardiac enzymes negative X 3  -CXR w/o vascular congestion or edema -BNP in WNL -believe symptoms are secondary to anemia -will transfuse 1 unit of PRBC -follow ferritin, iron and TIBC results  2-diastolic heart failure: compensated -will continue PO lasix -strict I's and O's, low sodium diet and daily weight  3-HTN: stable. Will continue current medication regimen  4-obesity: low calorie diet and exercise discussed with patient -appreciate nutrition service rec's and intervention  5-GERD: continue PPI  7-HLD: continue statins  8-hx of DVT: has completed anticoagulation therapy approx 4-6 weeks ago. Given paina nd slight swelling as described prior to admission, Will check LE duplex.  Code Status: Full Family Communication: sister at bedside  Disposition Plan: home when medically stable.   Consultants:  None   Procedures:  See below for x-ray reports   Antibiotics:  None   HPI/Subjective: Patient denies any CP, no SOB at rest.  Objective: Filed Vitals:   05/30/14 0709  BP: 405/54  Pulse: 63  Temp: 98.3 F (36.8 C)  Resp: 18    Intake/Output Summary (Last 24 hours) at 05/30/14 1052 Last data filed at 05/30/14 0854  Gross per 24 hour  Intake    360 ml  Output   2725 ml  Net  -2365 ml   Filed Weights   05/29/14 1700 05/30/14 0709  Weight: 125.238 kg (276 lb 1.6 oz) 123.424 kg (272 lb 1.6 oz)    Exam:   General:  AAOX3, denies CP; patient reports SOB is better.  Cardiovascular: S1 and S2, no rubs or gallops  Respiratory: CTA bilaterally  Abdomen: soft, NT, ND, positive BS  Musculoskeletal: no cyanosis; trace edema bilaterally; patient with some tenderness to palpation on his calf (but reports is not new). No erythema and no warm sensation.  Data Reviewed: Basic  Metabolic Panel:  Recent Labs Lab 05/27/14 1612 05/29/14 1410 05/30/14 0823  NA 139 139 140  K 4.1 4.3 3.8  CL 105 101 101  CO2 24 22 23   GLUCOSE 102* 139* 154*  BUN 12 12 13   CREATININE 0.89 1.14 0.93  CALCIUM 8.8 9.3 9.1   Liver Function Tests:  Recent Labs Lab 05/27/14 1612 05/29/14 1410  AST 24 35  ALT 30 33  ALKPHOS 85 100  BILITOT 0.5 0.4  PROT 6.0 6.8  ALBUMIN 3.6 3.4*   CBC:  Recent Labs Lab 05/27/14 1612 05/29/14 1410 05/30/14 0823  WBC 8.2 7.8 7.3  NEUTROABS 5.1 4.9  --   HGB 8.2* 7.9* 8.2*  HCT 26.2* 26.3* 27.8*  MCV 79.4 83.0 82.2  PLT 282 264 281   BNP (last 3 results)  Recent Labs  12/06/13 1021 12/22/13 0741 05/29/14 1910  PROBNP 34.0 27.0 36.3    Studies: Ct Angio Chest Pe W/cm &/or Wo Cm  05/29/2014   CLINICAL DATA:  Shortness of breath and lightheadedness. History of clot in the left leg.  EXAM: CT ANGIOGRAPHY CHEST WITH CONTRAST  TECHNIQUE: Multidetector CT imaging of the chest was performed using the standard protocol during bolus administration of intravenous contrast. Multiplanar CT image reconstructions and MIPs were obtained to evaluate the vascular anatomy.  CONTRAST:  163mL OMNIPAQUE IOHEXOL 350 MG/ML SOLN  COMPARISON:  09/03/2007  FINDINGS: Technically adequate study with moderately good opacification of the central and proximal segmental pulmonary arteries. No focal filling defects are demonstrated. No  evidence of significant pulmonary embolus.  Mild cardiac enlargement. Coronary artery and cardiac valvular calcifications. Calcification of thoracic aorta without aneurysm. Great vessels are tortuous but appear patent. No significant lymphadenopathy in the chest. Esophagus is decompressed. No pleural effusions. Visualized portions of the upper abdominal organs are grossly unremarkable.  Visualization of lung fields is limited due to respiratory motion artifact. There appear to be scattered emphysematous changes and fibrosis in the lungs.  Atelectasis in the lung bases is likely dependent. Chronic bronchitic changes are suggested. No focal airspace disease or pneumothorax. But degenerative changes in the thoracic spine. No destructive bone lesions are appreciated.  Review of the MIP images confirms the above findings.  IMPRESSION: No evidence of significant pulmonary embolus.   Electronically Signed   By: Lucienne Capers M.D.   On: 05/29/2014 22:54   Dg Chest Port 1 View  05/29/2014   CLINICAL DATA:  Shortness of Breath  EXAM: PORTABLE CHEST - 1 VIEW  COMPARISON:  December 31, 2011  FINDINGS: There is underlying emphysematous change. There is no edema or consolidation. Heart is mildly enlarged with normal pulmonary vascularity. No pneumothorax. There is atherosclerotic change in the aorta. No adenopathy.  IMPRESSION: Underlying emphysema. Mild cardiac enlargement. No edema or consolidation.   Electronically Signed   By: Lowella Grip M.D.   On: 05/29/2014 15:04    Scheduled Meds: . allopurinol  300 mg Oral Daily  . atorvastatin  20 mg Oral q1800  . furosemide  20 mg Intravenous Once  . furosemide  80 mg Oral Daily  . irbesartan  75 mg Oral Daily  . pantoprazole  40 mg Oral BID  . sodium chloride  3 mL Intravenous Q12H  . sodium chloride  3 mL Intravenous Q12H   Continuous Infusions:   Principal Problem:   Shortness of breath Active Problems:   HYPERTENSION, BENIGN   DVT of lower limb, acute   Chronic diastolic CHF (congestive heart failure)   Anemia   SOB (shortness of breath)   Time spent: >30 minutes   [place] [facility] Pager [phone]. If 7PM-7AM, please contact night-coverage at [email or web address], password [facility] 05/30/2014, 10:52 AM  LOS: 1 day

## 2014-05-30 NOTE — Progress Notes (Signed)
Pt and his daughter asked about venous duple result .  Informed both that no evidence of DVT.  Appears to be  superficial thrombus in Rt lesser saphenous vein.  Informed Dr. Clovis Fredrickson that there will be a final result to the test.  Pt and daughter made aware.  Karie Kirks, Therapist, sports.

## 2014-05-30 NOTE — Progress Notes (Signed)
Bilateral lower extremity venous duplex:  No evidence of DVT or Baker's cyst.  There appears to be superficial thrombus in the right lesser saphenous vein.

## 2014-05-31 LAB — CBC
HEMATOCRIT: 29 % — AB (ref 39.0–52.0)
HEMOGLOBIN: 8.7 g/dL — AB (ref 13.0–17.0)
MCH: 24.6 pg — ABNORMAL LOW (ref 26.0–34.0)
MCHC: 30 g/dL (ref 30.0–36.0)
MCV: 81.9 fL (ref 78.0–100.0)
Platelets: 243 10*3/uL (ref 150–400)
RBC: 3.54 MIL/uL — ABNORMAL LOW (ref 4.22–5.81)
RDW: 15.7 % — AB (ref 11.5–15.5)
WBC: 7.5 10*3/uL (ref 4.0–10.5)

## 2014-05-31 LAB — TYPE AND SCREEN
ABO/RH(D): A NEG
Antibody Screen: NEGATIVE
Unit division: 0

## 2014-05-31 LAB — BASIC METABOLIC PANEL
BUN: 13 mg/dL (ref 6–23)
CHLORIDE: 101 meq/L (ref 96–112)
CO2: 28 meq/L (ref 19–32)
CREATININE: 1.06 mg/dL (ref 0.50–1.35)
Calcium: 9.3 mg/dL (ref 8.4–10.5)
GFR calc non Af Amer: 63 mL/min — ABNORMAL LOW (ref >90)
GFR, EST AFRICAN AMERICAN: 73 mL/min — AB (ref >90)
GLUCOSE: 115 mg/dL — AB (ref 70–99)
POTASSIUM: 4.1 meq/L (ref 3.7–5.3)
Sodium: 142 mEq/L (ref 137–147)

## 2014-05-31 MED ORDER — SODIUM CHLORIDE 0.9 % IV SOLN
1000.0000 mg | Freq: Once | INTRAVENOUS | Status: AC
  Administered 2014-05-31: 1000 mg via INTRAVENOUS
  Filled 2014-05-31 (×2): qty 20

## 2014-05-31 MED ORDER — SODIUM CHLORIDE 0.9 % IV SOLN
25.0000 mg | Freq: Once | INTRAVENOUS | Status: AC
  Administered 2014-05-31: 25 mg via INTRAVENOUS
  Filled 2014-05-31: qty 0.5

## 2014-05-31 NOTE — Progress Notes (Signed)
MEDICATION RELATED CONSULT NOTE - INITIAL   Pharmacy Consult for IV Iron Indication: iron deficient anemia  Allergies  Allergen Reactions  . Penicillins     REACTION: Rash, swelling  . Rofecoxib   . Sulfamethoxazole-Trimethoprim     REACTION: questional onset of low platelets and bleeding  . Tramadol Hcl     REACTION: dizziness, drowsiness  . Neosporin [Neomycin-Polymyxin-Gramicidin] Rash    Patient Measurements: Height: 5\' 10"  (177.8 cm) Weight: 272 lb 7.8 oz (123.6 kg) IBW/kg (Calculated) : 73  Vital Signs: Temp: 98.6 F (37 C) (06/09 1950) Temp src: Oral (06/09 0613) BP: 123/45 mmHg (06/09 1124) Pulse Rate: 61 (06/09 1124) Intake/Output from previous day: 06/08 0701 - 06/09 0700 In: 1092.5 [P.O.:1080; Blood:12.5] Out: 2300 [Urine:2300] Intake/Output from this shift: Total I/O In: 600 [P.O.:600] Out: 450 [Urine:450]  Labs:  Recent Labs  05/29/14 1410 05/30/14 0823 05/31/14 0359  WBC 7.8 7.3 7.5  HGB 7.9* 8.2* 8.7*  HCT 26.3* 27.8* 29.0*  PLT 264 281 243  CREATININE 1.14 0.93 1.06  ALBUMIN 3.4*  --   --   PROT 6.8  --   --   AST 35  --   --   ALT 33  --   --   ALKPHOS 100  --   --   BILITOT 0.4  --   --    Estimated Creatinine Clearance: 69.6 ml/min (by C-G formula based on Cr of 1.06).   Microbiology: No results found for this or any previous visit (from the past 720 hour(s)).  Medical History: Past Medical History  Diagnosis Date  . Bradycardia     Hypertensive  . carotid bruit, right   . hypercholesterolemia     Trig 234/293;takes Simcor daily  . Obesity, morbid   . Vasculitis   . Immune thrombocytopenic purpura     Dr. Beryle Beams  . Dermatitis     legs  . Blood in stool   . Bleeding gums   . Chronic low back pain   . Myalgia and myositis   . Unspecified vitamin D deficiency   . Bladder diverticulum   . Primary osteoarthritis of both knees   . Anxiety   . Nephrolithiasis   . History of elevated glucose   . Thrombophlebitis     right forearm  . History of BPH   . History of colonic polyps     [name]  . Epididymitis, left     S/P Epidimectomy  . Pneumonia, bacterial     RML resolved, spirometry, normal  . History of ITP 2011    per Dr. Beryle Beams 2011, normal platelets  . Thrombocytopenia 05/20-05/27/10    Hosp ITP severe, Dr. Beryle Beams  . Splenomegaly 05/15/09    abd U/S NML  . Hypertension, benign     takes Micardis daily  . Sleep apnea     sitting/lying/exertion  . Neuropathy     acute  . Arthritis     hands  . Bruises easily   . H/O hiatal hernia   . GERD (gastroesophageal reflux disease)     hx of but doesn't take any meds now  . Urinary frequency   . Urinary urgency   . Nocturia   . Urinary leakage   . Malignant melanoma of ear     right  . Abnormality of gait 03/19/2013  . DVT of lower limb, acute 11/19/2013    Peroneal & distal tibial left 11/01/13   Assessment: 80 yo M admitted 05/29/2014 with anemia, hemoglobin remains  low after PRBCs, TSat 9%, pharmacy consulted to dose iron dextran.  Goal of Therapy:  Repletion of iron stores  Plan:  Iron Dextran 25 mg test dose followed by 1000 mg IV x 1 infused over 5 hours. Plan to follow up iron studies one month post this iron administration. Pharmacy will sign off, please call if questions.   Thank you for allowing pharmacy to be a part of this patients care team.  Rowe Robert Pharm.D., BCPS, AQ-Cardiology Clinical Pharmacist 05/31/2014 1:52 PM Pager: [phone] Phone: [phone]

## 2014-05-31 NOTE — Progress Notes (Signed)
[facility] PROGRESS NOTE  James [name] VOH:[id number] DOB: [birth year] [name]: 05/29/2014 PCP: [name], MD  Assessment/Plan: 1-SOB especially on exertion: no CP.  -Cardiac enzymes negative X 3  -CXR w/o vascular congestion or edema -BNP is WNL -believe symptoms are secondary to anemia -1 unit of PRBC given; Hgb 8.7 today -low ferritin (10), iron (30) and TIBC (9) demonstrating iron deficiency anemia -will load with IV iron and discharge on niferex -will require iron studies in 1 month by PCP  2-diastolic heart failure: compensated -will continue PO lasix now -strict I's and O's, low sodium diet and daily weight  3-HTN: stable. Will continue current medication regimen  4-obesity: low calorie diet and exercise discussed with patient -appreciate nutrition service rec's and intervention  5-GERD: continue PPI  7-HLD: continue statins  8-hx of DVT: has completed anticoagulation therapy approx 4-6 weeks ago. Given pain and slight swelling as described prior to admission, LE duplex was checked and patient didn't have DVT or baker cyst; positive right illiac SVT found.  Code Status: Full Family Communication: daughter at bedside  Disposition Plan: home when medically stable.   Consultants:  None   Procedures:  See below for x-ray reports   Antibiotics:  None   HPI/Subjective: Patient denies any CP, feeling a lot better; denies SOB. afebrile  Objective: Filed Vitals:   05/31/14 1510  BP: 121/46  Pulse: 58  Temp:   Resp: 20    Intake/Output Summary (Last 24 hours) at 05/31/14 2005 Last data filed at 05/31/14 1658  Gross per 24 hour  Intake    840 ml  Output   2050 ml  Net  -1210 ml   Filed Weights   05/29/14 1700 05/30/14 0709 05/31/14 0613  Weight: 125.238 kg (276 lb 1.6 oz) 123.424 kg (272 lb 1.6 oz) 123.6 kg (272 lb 7.8 oz)    Exam:   General:  AAOX3, denies CP; patient reports no SOB  Cardiovascular: S1 and S2, no rubs or  gallops  Respiratory: CTA bilaterally  Abdomen: soft, NT, ND, positive BS  Musculoskeletal: no cyanosis; trace edema bilaterally; patient with some tenderness to palpation on his calf (but reports is not new). No erythema and no warm sensation.  Data Reviewed: Basic Metabolic Panel:  Recent Labs Lab 05/27/14 1612 05/29/14 1410 05/30/14 0823 05/31/14 0359  NA 139 139 140 142  K 4.1 4.3 3.8 4.1  CL 105 101 101 101  CO2 24 22 23 28   GLUCOSE 102* 139* 154* 115*  BUN 12 12 13 13   CREATININE 0.89 1.14 0.93 1.06  CALCIUM 8.8 9.3 9.1 9.3   Liver Function Tests:  Recent Labs Lab 05/27/14 1612 05/29/14 1410  AST 24 35  ALT 30 33  ALKPHOS 85 100  BILITOT 0.5 0.4  PROT 6.0 6.8  ALBUMIN 3.6 3.4*   CBC:  Recent Labs Lab 05/27/14 1612 05/29/14 1410 05/30/14 0823 05/31/14 0359  WBC 8.2 7.8 7.3 7.5  NEUTROABS 5.1 4.9  --   --   HGB 8.2* 7.9* 8.2* 8.7*  HCT 26.2* 26.3* 27.8* 29.0*  MCV 79.4 83.0 82.2 81.9  PLT 282 264 281 243   BNP (last 3 results)  Recent Labs  12/06/13 1021 12/22/13 0741 05/29/14 1910  PROBNP 34.0 27.0 36.3    Studies: Ct Angio Chest Pe W/cm &/or Wo Cm  05/29/2014   CLINICAL DATA:  Shortness of breath and lightheadedness. History of clot in the left leg.  EXAM: CT ANGIOGRAPHY CHEST WITH CONTRAST  TECHNIQUE: Multidetector  CT imaging of the chest was performed using the standard protocol during bolus administration of intravenous contrast. Multiplanar CT image reconstructions and MIPs were obtained to evaluate the vascular anatomy.  CONTRAST:  173mL OMNIPAQUE IOHEXOL 350 MG/ML SOLN  COMPARISON:  09/03/2007  FINDINGS: Technically adequate study with moderately good opacification of the central and proximal segmental pulmonary arteries. No focal filling defects are demonstrated. No evidence of significant pulmonary embolus.  Mild cardiac enlargement. Coronary artery and cardiac valvular calcifications. Calcification of thoracic aorta without aneurysm.  Great vessels are tortuous but appear patent. No significant lymphadenopathy in the chest. Esophagus is decompressed. No pleural effusions. Visualized portions of the upper abdominal organs are grossly unremarkable.  Visualization of lung fields is limited due to respiratory motion artifact. There appear to be scattered emphysematous changes and fibrosis in the lungs. Atelectasis in the lung bases is likely dependent. Chronic bronchitic changes are suggested. No focal airspace disease or pneumothorax. But degenerative changes in the thoracic spine. No destructive bone lesions are appreciated.  Review of the MIP images confirms the above findings.  IMPRESSION: No evidence of significant pulmonary embolus.   Electronically Signed   By: Lucienne Capers M.D.   On: 05/29/2014 22:54    Scheduled Meds: . allopurinol  300 mg Oral Daily  . atorvastatin  20 mg Oral q1800  . furosemide  80 mg Oral Daily  . irbesartan  75 mg Oral Daily  . iron dextran (INFED/DEXFERRUM) infusion  1,000 mg Intravenous Once  . pantoprazole  40 mg Oral BID  . sodium chloride  3 mL Intravenous Q12H  . sodium chloride  3 mL Intravenous Q12H   Continuous Infusions:   Principal Problem:   Shortness of breath Active Problems:   HYPERTENSION, BENIGN   DVT of lower limb, acute   Chronic diastolic CHF (congestive heart failure)   Anemia   SOB (shortness of breath)   Time spent: >30 minutes   [place] [facility] Pager [phone]. If 7PM-7AM, please contact night-coverage at [email or web address], password [facility] 05/31/2014, 8:05 PM  LOS: 2 days

## 2014-05-31 NOTE — Evaluation (Signed)
Physical Therapy Evaluation Patient Details Name: James [name] MRN: [id number] DOB: 1928-04-02 Today's Date: 05/31/2014   History of Present Illness  Adm 6/7 with dyspnea. BNP 69. Hgb 7.9. dopplet RLE negative for DVT (+superficial thrombus and spoke to Dr. Dyann Kief with no plans to anticoagulate and OK'd activity). PMHx-CHF with EF 60%, Lt TKR, RLE DVT 12/14 and completed 6 weeks Xarelto, neuropathy  Clinical Impression  Patient evaluated by Physical Therapy with no further acute PT needs identified. All education has been completed and the patient has no further questions.  Thank you for this referral.     Follow Up Recommendations No PT follow up    Equipment Recommendations  None recommended by PT    Recommendations for Other Services       Precautions / Restrictions        Mobility  Bed Mobility                  Transfers Overall transfer level: Independent Equipment used: None                Ambulation/Gait Ambulation/Gait assistance: Independent Ambulation Distance (Feet): 400 Feet Assistive device: None Gait Pattern/deviations: WFL(Within Functional Limits)   Gait velocity interpretation: >2.62 ft/sec, indicative of independent community ambulator General Gait Details: able to vary speed without difficulty; head turns, avoid obstacles, 180 turns  Financial trader Rankin (Stroke Patients Only)       Balance Overall balance assessment: Independent                               Standardized Balance Assessment Standardized Balance Assessment : Dynamic Gait Index   Dynamic Gait Index Level Surface: Normal Change in Gait Speed: Normal Gait with Horizontal Head Turns: Normal Gait with Vertical Head Turns: Normal Gait and Pivot Turn: Normal Step Over Obstacle: Normal Step Around Obstacles: Normal       Pertinent Vitals/Pain SaO2 on RA: at rest 94%                      Walking  91-97% Very mild dyspnea (able to talk and walk throughout)    Home Living Family/patient expects to be discharged to:: Private residence Living Arrangements: Children Available Help at Discharge: Family;Available PRN/intermittently Type of Home: House Home Access: Level entry     Home Layout: Two level;Able to live on main level with bedroom/bathroom Home Equipment: Kasandra Knudsen - single point      Prior Function Level of Independence: Independent         Comments: denies falls; admits he is visually dependent for balance due to neuropathy and TKR (has mapped out plenty of overhead lighting to use at night)     Hand Dominance        Extremity/Trunk Assessment   Upper Extremity Assessment: Overall WFL for tasks assessed           Lower Extremity Assessment: RLE deficits/detail;LLE deficits/detail      Cervical / Trunk Assessment: Normal  Communication   Communication: No difficulties  Cognition Arousal/Alertness: Awake/alert Behavior During Therapy: WFL for tasks assessed/performed Overall Cognitive Status: Within Functional Limits for tasks assessed                      General Comments      Exercises  Assessment/Plan    PT Assessment Patent does not need any further PT services  PT Diagnosis     PT Problem List    PT Treatment Interventions     PT Goals (Current goals can be found in the Care Plan section) Acute Rehab PT Goals PT Goal Formulation: No goals set, d/c therapy    Frequency     Barriers to discharge        Co-evaluation               End of Session Equipment Utilized During Treatment:  (pulse oximeter) Activity Tolerance: Patient tolerated treatment well Patient left: in chair;with call bell/phone within reach Nurse Communication: Mobility status;Other (comment) (SaO2 fine with activity)         Time: 5537-4827 PT Time Calculation (min): 17 min   Charges:   PT Evaluation $Initial PT Evaluation Tier I:  1 Procedure     PT G Codes:          James [name] James [name] 06/25/14, 11:45 AM

## 2014-05-31 NOTE — Progress Notes (Signed)
Dextran test completed without any problem . Pt aware

## 2014-06-01 DIAGNOSIS — R7309 Other abnormal glucose: Secondary | ICD-10-CM

## 2014-06-01 LAB — CBC
HCT: 28.9 % — ABNORMAL LOW (ref 39.0–52.0)
HEMOGLOBIN: 8.8 g/dL — AB (ref 13.0–17.0)
MCH: 25.3 pg — AB (ref 26.0–34.0)
MCHC: 30.4 g/dL (ref 30.0–36.0)
MCV: 83 fL (ref 78.0–100.0)
Platelets: 237 10*3/uL (ref 150–400)
RBC: 3.48 MIL/uL — ABNORMAL LOW (ref 4.22–5.81)
RDW: 15.9 % — AB (ref 11.5–15.5)
WBC: 6.5 10*3/uL (ref 4.0–10.5)

## 2014-06-01 MED ORDER — PANTOPRAZOLE SODIUM 40 MG PO TBEC: 40.0000 mg | DELAYED_RELEASE_TABLET | Freq: Every day | ORAL | Status: DC

## 2014-06-01 MED ORDER — IRON POLYSACCH CMPLX-B12-FA 150-0.025-1 MG PO CAPS: 1.0000 | ORAL_CAPSULE | Freq: Two times a day (BID) | ORAL | Status: DC

## 2014-06-01 NOTE — Progress Notes (Signed)
Discharge instructions and prescriptions given to ptr. Verbalized understanding Wheeled to lobby by [facility]

## 2014-06-01 NOTE — Care Management Note (Signed)
    Page 1 of 1   06/01/2014     1:33:55 PM CARE MANAGEMENT NOTE 06/01/2014  Patient:  James [name], James [name]   Account Number:  [id number]  Date Initiated:  06/01/2014  Documentation initiated by:  [facility]  Subjective/Objective Assessment:   80 YO MALE presents with progressive shortness of breath over the past month.//Home with daughter.     Action/Plan:   Diurese.// Access for [facility] services   Anticipated DC Date:  06/01/2014   Anticipated DC Plan:  [place]  CM consult      Choice offered to / List presented to:             Status of service:   Medicare Important Message given?  YES (If response is "NO", the following Medicare IM given date fields will be blank) Date Medicare IM given:  05/29/2014 Date Additional Medicare IM given:  06/01/2014  Discharge Disposition:  HOME/SELF CARE  Per UR Regulation:  Reviewed for med. necessity/level of care/duration of stay  If discussed at [place] of Stay Meetings, dates discussed:    Comments:  06/01/14 [place], RN, BSN, NCM Pt being discharged home.  Did not want to sign Impertant Message from Medicare.  NCM gave him blank copy and placed one on chart with notes of his refusing to sign.

## 2014-06-01 NOTE — Discharge Summary (Signed)
Physician Discharge Summary  James [name] QJJ:[id number] DOB: 03-26-1928 [name]: 05/29/2014  PCP: [name], MD  Admit date: 05/29/2014 Discharge date: 06/01/2014  Time spent: >30 minutes  Recommendations for Outpatient Follow-up:  CBC to follow Hgb Iron studies in 1 month to follow needs for further supplementation Needs repeat duplex and D-dimer in 2 weeks for further evaluation and treatment on his right SVT Close follow up on his CBG's and A1C as he is now early diabetic status  Discharge Diagnoses:  Shortness of breath HYPERTENSION, BENIGN DVT of lower limb, acute Chronic diastolic CHF (congestive heart failure) Iron deficiency Anemia SOB (shortness of breath) Lesser saphenous SVT on right leg hyperglycemia  Discharge Condition: stable and improved. Denies CP or SOB at discharge.  Diet recommendation: low sodium, low carbohydrates and low calorie diet  Filed Weights   05/30/14 0709 05/31/14 0613 06/01/14 0507  Weight: 123.424 kg (272 lb 1.6 oz) 123.6 kg (272 lb 7.8 oz) 123.968 kg (273 lb 4.8 oz)    History of present illness:  80 y.o. male with a past medical history of deep venous thrombosis diagnosed November of 2014 treated with 5-6 months of anticoagulation with Xarelto, stopped 4-6 weeks ago, history of diastolic congestive heart failure with last transthoracic echocardiogram performed on 12/08/2013 that showed an ejection fraction of 60-65% with grade 1 diastolic dysfunction, presenting to the emergency room with complaints of shortness of breath. He reports having shortness of breath shortly after the discontinuation of Xarelto, progressively worsening over the past month. He also reports poor tolerance to physical exertion, becoming dyspneic ambulating around his house. He denies purulent sputum production, fevers, chills, nausea, vomiting, palpitations, hemoptysis or chest pain. Patient also reporting weight gain with associated increasing bilateral extremity swelling. He  is currently on Lasix 80 mg by mouth daily, but was instructed to take 120 mg by mouth daily this weekend for increased swelling. Lab work from 05/27/2014 showed a BNP of 69.3. Chest x-ray on today's visit revealed underlying emphysema without pulmonary edema or consolidation. Labs did show a hemoglobin of 7.9, however he was found to be guaiac-negative in the emergency room. He denies black tarry stools, bright red blood, hemoptysis or hematemesis. It appears he had a colonoscopy performed in 2010 which came back as normal. No evidence of polyps, AVMs, colitis, tumors, Crohn's or diverticulosis from ascending colon to rectum.   Hospital Course:  1-SOB especially on exertion: no CP.  -Cardiac enzymes negative X 3  -CXR w/o vascular congestion or pulmonary edema  -BNP is WNL  -believe symptoms are secondary to anemia  -1 unit of PRBC given during this admission; Hgb 8.8 at discharge  -low ferritin (10), iron (30) and TIBC (9) demonstrating iron deficiency anemia  -Patient also loaded IV iron and discharge on niferex  -will require iron studies to be repeated in 1 month   2-diastolic heart failure: compensated  -will continue PO lasix  -encouraged to follow low sodium diet and daily weights  3-HTN: stable. Will continue current medication regimen  -advise to follow low sodium diet  4-obesity: low calorie diet and exercise discussed with patient  -appreciate nutrition service rec's and intervention during this hospitalization  5-GERD: continue PPI   7-HLD: continue statins   8-hx of DVT: has completed anticoagulation therapy approx 4-6 weeks prior to [place]. Given pain and slight swelling as described prior to admission, LE duplex was checked and patient didn't have DVT or baker cyst; positive lesser saphenous SVT was found on his right leg (  opposite side from previous DVT). -case discussed with Hem/Onc and rec's is to continue ASA and follow up in 2 weeks with repeat of LE dopplers to  watch for extension and to check a d-dimer at that time. Base on result he might required another 2 weeks or so of anticoagulation at that time.   9-hyperglycemia: A1C 6.5 -patient needs aggressive lifestyle changes and close follow up of CBG's/A!C as an outpatient -if sugar continue to be in this range will benefit of low dose oral hypoglycemic regimen.   Procedures:  LE duplex: no DVT, no baker cyst; positive SVT on patient right lesser saphenous vein  Consultations:  Hem/onc (Dr. Beryle Beams curbside) for guidance in findings of SVT in patient with hx of DVT; no anticoagulation recommended.  Discharge Exam: Filed Vitals:   06/01/14 0507  BP: 115/56  Pulse: 63  Temp: 97.9 F (36.6 C)  Resp: 18   General: AAOX3, denies CP; patient reports no SOB  Cardiovascular: S1 and S2, no rubs or gallops  Respiratory: CTA bilaterally  Abdomen: soft, NT, ND, positive BS  Musculoskeletal: no cyanosis; trace edema bilaterally; No erythema and no warm sensation.  Discharge Instructions You were cared for by a hospitalist during your hospital stay. If you have any questions about your discharge medications or the care you received while you were in the hospital after you are discharged, you can call the unit and asked to speak with the hospitalist on call if the hospitalist that took care of you is not available. Once you are discharged, your primary care physician will handle any further medical issues. Please note that NO REFILLS for any discharge medications will be authorized once you are discharged, as it is imperative that you return to your primary care physician (or establish a relationship with a primary care physician if you do not have one) for your aftercare needs so that they can reassess your need for medications and monitor your lab values.  Discharge Instructions   Diet - low sodium heart healthy    Complete by:  As directed      Discharge instructions    Complete by:  As directed    Arrange follow up with PCP in 10 days Take medications as prescribed Follow a low sodium diet (less than 2 grams daily) Follow a low carbohydrates intake and follow a low calorie diet            Medication List         allopurinol 300 MG tablet  Commonly known as:  ZYLOPRIM  Take 300 mg by mouth daily.     aspirin 81 MG tablet  Take 81 mg by mouth daily.     furosemide 40 MG tablet  Commonly known as:  LASIX  Take 80 mg by mouth daily.     Iron Polysacch Cmplx-B12-FA 150-0.025-1 MG Caps  Take 1 capsule by mouth 2 (two) times daily.     LIVALO 4 MG Tabs  Generic drug:  Pitavastatin Calcium  Take 4 mg by mouth daily.     multivitamin tablet  Take 1 tablet by mouth daily.     niacin 1000 MG CR tablet  Commonly known as:  NIASPAN  Take 1,000 mg by mouth at bedtime.     nitroGLYCERIN 0.4 MG SL tablet  Commonly known as:  NITROSTAT  Place 1 tablet (0.4 mg total) under the tongue every 5 (five) minutes as needed. For chest pain     OSTEO BI-FLEX JOINT SHIELD PO  Take 2 tablets by mouth daily.     pantoprazole 40 MG tablet  Commonly known as:  PROTONIX  Take 1 tablet (40 mg total) by mouth daily.     PROBIOTIC & ACIDOPHILUS EX ST PO  Take 1 tablet by mouth daily.     PSYLLIUM PO  Take 20 mLs by mouth every evening. Powder. 4 teaspoonfuls every evening     telmisartan 40 MG tablet  Commonly known as:  MICARDIS  Take 40 mg by mouth daily.     Vitamin D3 2000 UNITS capsule  Take 2,000 Units by mouth daily.       Allergies  Allergen Reactions  . Penicillins     REACTION: Rash, swelling  . Rofecoxib   . Sulfamethoxazole-Trimethoprim     REACTION: questional onset of low platelets and bleeding  . Tramadol Hcl     REACTION: dizziness, drowsiness  . Neosporin [Neomycin-Polymyxin-Gramicidin] Rash       Follow-up Information   Follow up with [name], MD In 10 days.   Specialty:  Family Medicine   Contact information:   [address] [place] [address] [phone]       The results of significant diagnostics from this hospitalization (including imaging, microbiology, ancillary and laboratory) are listed below for reference.    Significant Diagnostic Studies: Ct Angio Chest Pe W/cm &/or Wo Cm  05/29/2014   CLINICAL DATA:  Shortness of breath and lightheadedness. History of clot in the left leg.  EXAM: CT ANGIOGRAPHY CHEST WITH CONTRAST  TECHNIQUE: Multidetector CT imaging of the chest was performed using the standard protocol during bolus administration of intravenous contrast. Multiplanar CT image reconstructions and MIPs were obtained to evaluate the vascular anatomy.  CONTRAST:  159mL OMNIPAQUE IOHEXOL 350 MG/ML SOLN  COMPARISON:  09/03/2007  FINDINGS: Technically adequate study with moderately good opacification of the central and proximal segmental pulmonary arteries. No focal filling defects are demonstrated. No evidence of significant pulmonary embolus.  Mild cardiac enlargement. Coronary artery and cardiac valvular calcifications. Calcification of thoracic aorta without aneurysm. Great vessels are tortuous but appear patent. No significant lymphadenopathy in the chest. Esophagus is decompressed. No pleural effusions. Visualized portions of the upper abdominal organs are grossly unremarkable.  Visualization of lung fields is limited due to respiratory motion artifact. There appear to be scattered emphysematous changes and fibrosis in the lungs. Atelectasis in the lung bases is likely dependent. Chronic bronchitic changes are suggested. No focal airspace disease or pneumothorax. But degenerative changes in the thoracic spine. No destructive bone lesions are appreciated.  Review of the MIP images confirms the above findings.  IMPRESSION: No evidence of significant pulmonary embolus.   Electronically Signed   By: Lucienne Capers M.D.   On: 05/29/2014 22:54   Dg Chest Port 1 View  05/29/2014   CLINICAL DATA:  Shortness of  Breath  EXAM: PORTABLE CHEST - 1 VIEW  COMPARISON:  December 31, 2011  FINDINGS: There is underlying emphysematous change. There is no edema or consolidation. Heart is mildly enlarged with normal pulmonary vascularity. No pneumothorax. There is atherosclerotic change in the aorta. No adenopathy.  IMPRESSION: Underlying emphysema. Mild cardiac enlargement. No edema or consolidation.   Electronically Signed   By: Lowella Grip M.D.   On: 05/29/2014 15:04   Labs: Basic Metabolic Panel:  Recent Labs Lab 05/27/14 1612 05/29/14 1410 05/30/14 0823 05/31/14 0359  NA 139 139 140 142  K 4.1 4.3 3.8 4.1  CL 105 101 101 101  CO2 24 22 23 28   GLUCOSE 102* 139* 154* 115*  BUN 12 12 13 13   CREATININE 0.89 1.14 0.93 1.06  CALCIUM 8.8 9.3 9.1 9.3   Liver Function Tests:  Recent Labs Lab 05/27/14 1612 05/29/14 1410  AST 24 35  ALT 30 33  ALKPHOS 85 100  BILITOT 0.5 0.4  PROT 6.0 6.8  ALBUMIN 3.6 3.4*   CBC:  Recent Labs Lab 05/27/14 1612 05/29/14 1410 05/30/14 0823 05/31/14 0359 06/01/14 0523  WBC 8.2 7.8 7.3 7.5 6.5  NEUTROABS 5.1 4.9  --   --   --   HGB 8.2* 7.9* 8.2* 8.7* 8.8*  HCT 26.2* 26.3* 27.8* 29.0* 28.9*  MCV 79.4 83.0 82.2 81.9 83.0  PLT 282 264 281 243 237   BNP (last 3 results)  Recent Labs  12/06/13 1021 12/22/13 0741 05/29/14 1910  PROBNP 34.0 27.0 36.3    Signed:  Barton Dubois  [facility] 06/01/2014, 7:30 AM

## 2014-06-06 ENCOUNTER — Encounter: Payer: Self-pay | Admitting: *Deleted

## 2014-06-06 ENCOUNTER — Ambulatory Visit (INDEPENDENT_AMBULATORY_CARE_PROVIDER_SITE_OTHER): Payer: Medicare Other | Admitting: Family Medicine

## 2014-06-06 ENCOUNTER — Encounter: Payer: Self-pay | Admitting: Family Medicine

## 2014-06-06 VITALS — BP 122/60 | HR 63 | Wt 278.2 lb

## 2014-06-06 DIAGNOSIS — R0602 Shortness of breath: Secondary | ICD-10-CM

## 2014-06-06 DIAGNOSIS — Z86718 Personal history of other venous thrombosis and embolism: Secondary | ICD-10-CM

## 2014-06-06 DIAGNOSIS — D649 Anemia, unspecified: Secondary | ICD-10-CM

## 2014-06-06 LAB — CBC WITH DIFFERENTIAL/PLATELET
BASOS ABS: 0 10*3/uL (ref 0.0–0.1)
BASOS PCT: 0.5 % (ref 0.0–3.0)
EOS ABS: 0.1 10*3/uL (ref 0.0–0.7)
Eosinophils Relative: 1.8 % (ref 0.0–5.0)
HCT: 30.3 % — ABNORMAL LOW (ref 39.0–52.0)
Hemoglobin: 9.4 g/dL — ABNORMAL LOW (ref 13.0–17.0)
LYMPHS PCT: 20.9 % (ref 12.0–46.0)
Lymphs Abs: 1.6 10*3/uL (ref 0.7–4.0)
MCHC: 31 g/dL (ref 30.0–36.0)
MCV: 83.7 fl (ref 78.0–100.0)
Monocytes Absolute: 0.9 10*3/uL (ref 0.1–1.0)
Monocytes Relative: 12.1 % — ABNORMAL HIGH (ref 3.0–12.0)
NEUTROS PCT: 64.7 % (ref 43.0–77.0)
Neutro Abs: 4.9 10*3/uL (ref 1.4–7.7)
PLATELETS: 284 10*3/uL (ref 150.0–400.0)
RBC: 3.63 Mil/uL — ABNORMAL LOW (ref 4.22–5.81)
RDW: 17.7 % — AB (ref 11.5–15.5)
WBC: 7.6 10*3/uL (ref 4.0–10.5)

## 2014-06-06 LAB — BASIC METABOLIC PANEL
BUN: 12 mg/dL (ref 6–23)
CALCIUM: 9.3 mg/dL (ref 8.4–10.5)
CHLORIDE: 105 meq/L (ref 96–112)
CO2: 26 mEq/L (ref 19–32)
CREATININE: 0.9 mg/dL (ref 0.4–1.5)
GFR: 81.28 mL/min (ref >60.00)
GLUCOSE: 110 mg/dL — AB (ref 70–99)
POTASSIUM: 3.7 meq/L (ref 3.5–5.1)
Sodium: 138 mEq/L (ref 135–145)

## 2014-06-06 LAB — BRAIN NATRIURETIC PEPTIDE: Pro B Natriuretic peptide (BNP): 83 pg/mL (ref 0.0–100.0)

## 2014-06-06 NOTE — Progress Notes (Signed)
Pre visit review using our clinic review tool, if applicable. No additional management support is needed unless otherwise documented below in the visit note.  Hospital records reviewed:  Admitted with anemia and SOB.  Likely "slow leak" while on DVT tx w/o stool heme positive.  Transfused, started on IV then PO iron.  Diuresed with IV lasix. Here for w/u.  SVT noted in R leg, d/w pt about plan.  Still SOB with lower exercise tolerance than normal. Was able to make his trip to [place].  Compliant with meds.  On ASA 81mg  currently, not on xarelto.    PMH and SH reviewed  ROS: See HPI, otherwise noncontributory.  Meds, vitals, and allergies reviewed.   nad ncat Mmm rrr ctab abd soft, not ttp Ext with 1+ BLE edema

## 2014-06-06 NOTE — Patient Instructions (Signed)
Go to the lab on the way out.  We'll contact you with your lab report.  Repeat D dimer and ultrasound in about 1 week.   Follow up iron panel and CBC in about 1 month.   We'll be in touch in the meantime.  Take care.

## 2014-06-06 NOTE — Assessment & Plan Note (Signed)
On ASA, s/p 1 U PRBC and IV iron, now on PO iron.  Recheck CBC and routine labs today given the SOB.   SOB likely related to anemia.  D/w pt.   Recheck D dimer and u/s in about 1 week, recheck iron labs in about 1 month.  Continue PO iron.  Okay for outpatient f/u.  He agrees.  >25 minutes spent in face to face time with patient, >50% spent in counselling or coordination of care.

## 2014-06-08 ENCOUNTER — Other Ambulatory Visit: Payer: Self-pay | Admitting: Family Medicine

## 2014-06-09 ENCOUNTER — Encounter: Payer: Self-pay | Admitting: *Deleted

## 2014-06-10 ENCOUNTER — Ambulatory Visit: Payer: Medicare Other | Admitting: Family Medicine

## 2014-06-16 ENCOUNTER — Ambulatory Visit (INDEPENDENT_AMBULATORY_CARE_PROVIDER_SITE_OTHER): Payer: Medicare Other | Admitting: Cardiology

## 2014-06-16 ENCOUNTER — Encounter: Payer: Self-pay | Admitting: Cardiology

## 2014-06-16 ENCOUNTER — Telehealth (HOSPITAL_COMMUNITY): Payer: Self-pay

## 2014-06-16 ENCOUNTER — Other Ambulatory Visit (HOSPITAL_COMMUNITY): Payer: Self-pay | Admitting: Cardiology

## 2014-06-16 ENCOUNTER — Ambulatory Visit (HOSPITAL_COMMUNITY): Payer: Medicare Other | Attending: Internal Medicine | Admitting: Cardiology

## 2014-06-16 ENCOUNTER — Telehealth: Payer: Self-pay | Admitting: Critical Care Medicine

## 2014-06-16 VITALS — BP 120/66 | HR 69 | Ht 70.0 in | Wt 279.0 lb

## 2014-06-16 DIAGNOSIS — I10 Essential (primary) hypertension: Secondary | ICD-10-CM

## 2014-06-16 DIAGNOSIS — R0609 Other forms of dyspnea: Secondary | ICD-10-CM

## 2014-06-16 DIAGNOSIS — Z86718 Personal history of other venous thrombosis and embolism: Secondary | ICD-10-CM | POA: Insufficient documentation

## 2014-06-16 DIAGNOSIS — I8000 Phlebitis and thrombophlebitis of superficial vessels of unspecified lower extremity: Secondary | ICD-10-CM

## 2014-06-16 DIAGNOSIS — R609 Edema, unspecified: Secondary | ICD-10-CM | POA: Insufficient documentation

## 2014-06-16 DIAGNOSIS — R0602 Shortness of breath: Secondary | ICD-10-CM

## 2014-06-16 DIAGNOSIS — I2510 Atherosclerotic heart disease of native coronary artery without angina pectoris: Secondary | ICD-10-CM

## 2014-06-16 DIAGNOSIS — I82409 Acute embolism and thrombosis of unspecified deep veins of unspecified lower extremity: Secondary | ICD-10-CM

## 2014-06-16 DIAGNOSIS — I5032 Chronic diastolic (congestive) heart failure: Secondary | ICD-10-CM

## 2014-06-16 DIAGNOSIS — I509 Heart failure, unspecified: Secondary | ICD-10-CM

## 2014-06-16 DIAGNOSIS — I25119 Atherosclerotic heart disease of native coronary artery with unspecified angina pectoris: Secondary | ICD-10-CM

## 2014-06-16 DIAGNOSIS — R0989 Other specified symptoms and signs involving the circulatory and respiratory systems: Secondary | ICD-10-CM

## 2014-06-16 DIAGNOSIS — I8003 Phlebitis and thrombophlebitis of superficial vessels of lower extremities, bilateral: Secondary | ICD-10-CM

## 2014-06-16 DIAGNOSIS — I803 Phlebitis and thrombophlebitis of lower extremities, unspecified: Secondary | ICD-10-CM | POA: Insufficient documentation

## 2014-06-16 DIAGNOSIS — I209 Angina pectoris, unspecified: Secondary | ICD-10-CM

## 2014-06-16 LAB — CBC WITH DIFFERENTIAL/PLATELET
Basophils Absolute: 0 10*3/uL (ref 0.0–0.1)
Basophils Relative: 0.4 % (ref 0.0–3.0)
Eosinophils Absolute: 0.1 10*3/uL (ref 0.0–0.7)
Eosinophils Relative: 1.8 % (ref 0.0–5.0)
HCT: 34.1 % — ABNORMAL LOW (ref 39.0–52.0)
Hemoglobin: 10.7 g/dL — ABNORMAL LOW (ref 13.0–17.0)
Lymphocytes Relative: 22 % (ref 12.0–46.0)
Lymphs Abs: 1.6 10*3/uL (ref 0.7–4.0)
MCHC: 31.5 g/dL (ref 30.0–36.0)
MCV: 85.8 fl (ref 78.0–100.0)
MONO ABS: 0.8 10*3/uL (ref 0.1–1.0)
Monocytes Relative: 10.4 % (ref 3.0–12.0)
NEUTROS ABS: 4.9 10*3/uL (ref 1.4–7.7)
NEUTROS PCT: 65.4 % (ref 43.0–77.0)
Platelets: 238 10*3/uL (ref 150.0–400.0)
RBC: 3.97 Mil/uL — AB (ref 4.22–5.81)
RDW: 24.2 % — ABNORMAL HIGH (ref 11.5–15.5)
WBC: 7.4 10*3/uL (ref 4.0–10.5)

## 2014-06-16 MED ORDER — FUROSEMIDE 40 MG PO TABS: ORAL_TABLET | ORAL | Status: DC

## 2014-06-16 MED ORDER — POTASSIUM CHLORIDE CRYS ER 20 MEQ PO TBCR: 20.0000 meq | EXTENDED_RELEASE_TABLET | Freq: Every day | ORAL | Status: DC

## 2014-06-16 NOTE — Patient Instructions (Signed)
Your physician has recommended you make the following change in your medication:   START TAKING LASIX 80 MG IN THE MORNING AND 40 MG IN THE EVENING  START TAKING POTASSIUM 20 [place]  Your physician recommends that you return for lab work in: [place] (CBC)  Your physician recommends that you return for lab work in: [place] (BMET)  Your physician has requested that you have en exercise stress myoview. For further information please visit [email or web address]. Please follow instruction sheet, as given.  PLEASE FOLLOW-UP WITH DR Pauline Good PULMONOLOGIST  Your physician recommends that you schedule a follow-up appointment in: [place] have been referred to [place]

## 2014-06-16 NOTE — Progress Notes (Signed)
Bilateral lower extremity venous duplex performed 

## 2014-06-16 NOTE — Telephone Encounter (Signed)
Spoke with the pt  He states that it was rec by cards that he see PW again for dyspnea  Last ov here was on 08  We explained that PW no longer taking new pt's in the [place] clinic and that since it's been so long he would be considered a new pt  He states that he does not agree with this and that PW had told him in the past he would see him any time  PW, please advise, thanks!

## 2014-06-16 NOTE — Progress Notes (Signed)
Patient ID: James [name], male   DOB: 1928-05-25, 80 y.o.   MRN: [id number] PCP: Dr. Damita Dunnings  80 yo with history of nonobstructive CAD and chronic diastolic CHF presents for cardiology followup.   In 11/14, he had a left leg DVT.  Only trigger seems to have been that he sat for 2 days in a row in a theater taking a test for the [facility] prior to the DVT.  He was on Xarelto for 6 months.    Patient has been on Lasix 80 mg daily for several years now.  He has chronic exertional dyspnea.  However, over the last few months, this has considerably worsened.  He had an echo in 12/14 with EF 60-65% and aortic sclerosis without significant stenosis.  He was admitted earlier this month with dyspnea. CTA chest was negative for PE.  Lower extremity dopplers showed a superficial thrombus but no DVT. He was found to be anemic with hemoglobin 7.9.  He got 1 unit PRBCs and IV iron.  He also was diuresed with IV Lasix.  He says that he really has not felt much better.  Repeat hemoglobin more recently was 9.4.  He has not had overt GI bleeding.  Currently, he is short of breath walking short distances such as around his house (bedroom to kitchen).  He was quite dyspneic walking into the office today.  No orthopnea or PND.  No chest pain or tightness.  No palpitations.    Labs (10/14): LDL 54, HDL 59 Labs (12/14): K 3.8, creatinine 0.9, HCT 40.3 Labs (6/15): K 3.7, creatinine 0.9, hemoglobin 7.9 => 9.4, BNP 83  PMH: 1. Carotid stenosis: Carotid dopplers (12/13) with 0-39% stenosis.  2. Hyperlipidemia 3. Gout 4. ITP: 5/10.  Thought to be due to Septra.  5. Obesity 6. OA 7. BPH 8. Nephrolithiasis 9. OSA 10. GERD 11. H/o melanoma 12. CAD: LHC (6/05) with EF 60%, 50% mLCx stenosis, 40% pLAD stenosis.  No intervention.  13. DVT (11/14): Spontaneous.  APLAS serologies negative.  6/15 [place] for DVT showed no DVT but did show a superficial thrombus right lesser saphenous vein.  14. Diastolic CHF: Echo (72/53) with EF  60-65%, aortic sclerosis without significant stenosis.   SH: Married, lives in [place], retired, 2 kids, quit smoking in 1968.   FH: h/o CVA and CAD  ROS: All systems reviewed and negative except as per HPI.   Current Outpatient Prescriptions  Medication Sig Dispense Refill  . allopurinol (ZYLOPRIM) 300 MG tablet Take 300 mg by mouth daily.      Marland Kitchen aspirin 81 MG tablet Take 81 mg by mouth daily.      . Cholecalciferol (VITAMIN D3) 2000 UNITS capsule Take 2,000 Units by mouth daily.        . furosemide (LASIX) 40 MG tablet Take 80 mg by mouth daily.      . Iron Polysacch Cmplx-B12-FA 150-0.025-1 MG CAPS Take 1 capsule by mouth 2 (two) times daily.  60 each  1  . Misc Natural Products (OSTEO BI-FLEX JOINT SHIELD PO) Take 2 tablets by mouth daily.       . Multiple Vitamin (MULTIVITAMIN) tablet Take 1 tablet by mouth daily.        . niacin (NIASPAN) 1000 MG CR tablet Take 1,000 mg by mouth at bedtime.      . nitroGLYCERIN (NITROSTAT) 0.4 MG SL tablet Place 1 tablet (0.4 mg total) under the tongue every 5 (five) minutes as needed. For chest pain  25  tablet  5  . pantoprazole (PROTONIX) 40 MG tablet Take 1 tablet (40 mg total) by mouth daily.  30 tablet  1  . Pitavastatin Calcium (LIVALO) 4 MG TABS Take 4 mg by mouth daily.      . Probiotic Product (PROBIOTIC & ACIDOPHILUS EX ST PO) Take 1 tablet by mouth daily.      . PSYLLIUM PO Take 20 mLs by mouth every evening. Powder. 4 teaspoonfuls every evening       . telmisartan (MICARDIS) 40 MG tablet Take 40 mg by mouth daily.      . furosemide (LASIX) 40 MG tablet TAKE 80 MG IN THE A.M. AND 40 MG IN THE P.M.  90 tablet  3  . potassium chloride SA (KLOR-CON M20) 20 MEQ tablet Take 1 tablet (20 mEq total) by mouth daily.  90 tablet  3   No current facility-administered medications for this visit.    BP 120/66  Pulse 69  Ht 5\' 10"  (1.778 m)  Wt 279 lb (126.554 kg)  BMI 40.03 kg/m2  SpO2 97% General: NAD, obese Neck: Thick, JVP difficult  but may be mildly elevated 8-9 cm, no thyromegaly or thyroid nodule.  Lungs: Clear to auscultation bilaterally with normal respiratory effort. CV: Nondisplaced PMI.  Heart regular S1/S2, no S3/S4, no murmur. 1+ edema 1/2 to knee bilaterally.  No carotid bruit.  Normal pedal pulses.  Abdomen: Soft, nontender, no hepatosplenomegaly, no distention.  Skin: Intact without lesions or rashes.  Neurologic: Alert and oriented x 3.  Psych: Normal affect. Extremities: No clubbing or cyanosis.   Assessment/Plan: 1. Dyspnea: This has considerably worsened over the last few months.  He was anemic in 6/15, but transfusion did not help much and last hemoglobin was up to 9.4 and he is still dyspneic. No PE on CTA chest earlier this month.  He does look somewhat volume overloaded on exam, so diastolic CHF may play a role. He does, of note, have a history of nonbstructive CAD.  He carries a history of OSA but does not use CPAP. He quit smoking years ago.  - Check CBC - ETT-Cardiolite to assess for ischemia as cause of symptoms.  - Increase Lasix to 80 qam, 40 qpm with KCl 20 daily.  - I will have him followup with his pulmonologist.  - He will followup with me next week.  If the above steps are not revealing, will consider RHC/LHC.  2. Hyperlipidemia: Good lipids in 10/14.  Continue Livalo.  3. DVT: Occurred in 11/14.  Possibly triggered by long hours sitting in a theater seat taking a test.  Had 6 months of Xarelto, now back on ASA and off Xarelto.  Serologies for APLAS were negative.  CTA chest in 6/15 was negative for PE but he had a superficial thrombus on recent lower extremity dopplers.  He is going to have repeat lower extremity dopplers today to make sure that there has not been extension to involve the deep veins.  He has been followed for this by hematology.  4. Chronic diastolic CHF: NYHA class III symptoms.  JVP mildly elevated on exam, suspect that volume overload is playing a role in his dyspnea.   -  Increase Lasix to 80 qam/40 qpm, add KCl 20 daily.  Will need BMET in 2 wks.  - I will have him see Alferd Apa to discuss low sodium diet and glycemic control.  5. HTN: BP is controlled currently.   Loralie Champagne 06/16/2014

## 2014-06-17 ENCOUNTER — Telehealth: Payer: Self-pay | Admitting: *Deleted

## 2014-06-17 ENCOUNTER — Ambulatory Visit (HOSPITAL_COMMUNITY)
Admission: RE | Admit: 2014-06-17 | Discharge: 2014-06-17 | Disposition: A | Discharge status: Home / Self Care | Payer: Medicare Other | Source: Ambulatory Visit | Attending: Cardiology | Admitting: Cardiology

## 2014-06-17 DIAGNOSIS — R0989 Other specified symptoms and signs involving the circulatory and respiratory systems: Secondary | ICD-10-CM | POA: Insufficient documentation

## 2014-06-17 DIAGNOSIS — R0609 Other forms of dyspnea: Secondary | ICD-10-CM | POA: Insufficient documentation

## 2014-06-17 DIAGNOSIS — Z8249 Family history of ischemic heart disease and other diseases of the circulatory system: Secondary | ICD-10-CM | POA: Insufficient documentation

## 2014-06-17 DIAGNOSIS — I509 Heart failure, unspecified: Secondary | ICD-10-CM | POA: Insufficient documentation

## 2014-06-17 DIAGNOSIS — I10 Essential (primary) hypertension: Secondary | ICD-10-CM | POA: Insufficient documentation

## 2014-06-17 DIAGNOSIS — R079 Chest pain, unspecified: Secondary | ICD-10-CM | POA: Insufficient documentation

## 2014-06-17 DIAGNOSIS — I25119 Atherosclerotic heart disease of native coronary artery with unspecified angina pectoris: Secondary | ICD-10-CM

## 2014-06-17 DIAGNOSIS — I5032 Chronic diastolic (congestive) heart failure: Secondary | ICD-10-CM

## 2014-06-17 DIAGNOSIS — E669 Obesity, unspecified: Secondary | ICD-10-CM | POA: Insufficient documentation

## 2014-06-17 DIAGNOSIS — R42 Dizziness and giddiness: Secondary | ICD-10-CM | POA: Insufficient documentation

## 2014-06-17 DIAGNOSIS — I2510 Atherosclerotic heart disease of native coronary artery without angina pectoris: Secondary | ICD-10-CM | POA: Insufficient documentation

## 2014-06-17 DIAGNOSIS — Z87891 Personal history of nicotine dependence: Secondary | ICD-10-CM | POA: Insufficient documentation

## 2014-06-17 DIAGNOSIS — I779 Disorder of arteries and arterioles, unspecified: Secondary | ICD-10-CM | POA: Insufficient documentation

## 2014-06-17 MED ORDER — TECHNETIUM TC 99M SESTAMIBI GENERIC - CARDIOLITE
10.8000 | Freq: Once | INTRAVENOUS | Status: AC | PRN
  Administered 2014-06-17: 11 via INTRAVENOUS

## 2014-06-17 MED ORDER — TECHNETIUM TC 99M SESTAMIBI GENERIC - CARDIOLITE
30.9000 | Freq: Once | INTRAVENOUS | Status: AC | PRN
  Administered 2014-06-17: 30.9 via INTRAVENOUS

## 2014-06-17 MED ORDER — AMINOPHYLLINE 25 MG/ML IV SOLN
75.0000 mg | Freq: Once | INTRAVENOUS | Status: AC
  Administered 2014-06-17: 75 mg via INTRAVENOUS

## 2014-06-17 MED ORDER — REGADENOSON 0.4 MG/5ML IV SOLN
0.4000 mg | Freq: Once | INTRAVENOUS | Status: AC
  Administered 2014-06-17: 0.4 mg via INTRAVENOUS

## 2014-06-17 NOTE — Procedures (Addendum)
[place] [address] [place] [address] [phone]  Cardiology Nuclear Med Study  James [name] is a 80 y.o. male     MRN : [id number]     DOB: 11/16/1928  Procedure Date: 06/17/2014  Nuclear Med Background Indication for Stress Test:  Evaluation for Ischemia and Post Hospital History:  CAD;CHRONIC CHF;Last NUC MPI on 05/11/2004-ischemia;EF=71% Cardiac Risk Factors: Carotid Disease, Family History - CAD, History of Smoking, Hypertension, Lipids, Obesity and Edema;DVT;Right bruit  Symptoms:  Chest Pain, Dizziness, DOE, Fatigue and Light-Headedness   Nuclear Pre-Procedure Caffeine/Decaff Intake:  1:00am NPO After: 11am   IV Site: R Forearm  IV 0.9% NS with Angio Cath:  22g  Chest Size (in):  54"  IV Started by: Rolene Course, RN  Height: 5\' 10"  (1.778 m)  Cup Size: n/a  BMI:  Body mass index is 40.03 kg/(m^2). Weight:  279 lb (126.554 kg)   Tech Comments:  n/a    Nuclear Med Study 1 or 2 day study: 1 day  Stress Test Type:  Stress  Order Authorizing Provider:  Loralie Champagne, MD   Resting Radionuclide: Technetium 55m Sestamibi  Resting Radionuclide Dose: 10.8 mCi   Stress Radionuclide:  Technetium 24m Sestamibi  Stress Radionuclide Dose: 30.9 mCi           Stress Protocol Rest HR:60 Stress HR: 75  Rest BP: 206/60 Stress BP: 220/50  Exercise Time (min): n/a METS: n/a   Predicted Max HR: 137 bpm % Max HR: 54.74 bpm Rate Pressure Product: 16500  Dose of Adenosine (mg):  n/a Dose of Lexiscan: 0.4 mg  Dose of Atropine (mg): n/a Dose of Dobutamine: n/a mcg/kg/min (at max HR)  Stress Test Technologist: Leane Para, CCT Nuclear Technologist: Imagene Riches, CNMT   Rest Procedure:  Myocardial perfusion imaging was performed at rest 45 minutes following the intravenous administration of Technetium 32m Sestamibi. Stress Procedure:  The patient received IV Lexiscan 0.4 mg over 15-seconds.  Technetium 58m  Sestamibi injected IV at 30-seconds.  Patient experienced SOB and 75 mg of Aminophylline IV was administered. There were no significant changes with Lexiscan.  Quantitative spect images were obtained after a 45 minute delay.  Transient Ischemic Dilatation (Normal <1.22):  0.99  QGS EDV:  88 ml QGS ESV:  23 ml LV Ejection Fraction: 74%       Rest ECG: NSR - Normal EKG  Stress ECG: No significant change from baseline ECG  QPS Raw Data Images:  Normal; no motion artifact; normal heart/lung ratio. Stress Images:  Small in size and mild in intensity upper mid to basal inferior defect Rest Images:  There is mild apical thinning with normal uptake in other regions. Subtraction (SDS):  Mild inferior ischemia  Impression Exercise Capacity:  Lexiscan with no exercise. BP Response:  Hypertensive blood pressure response to 220/50. Clinical Symptoms:  Mild shortness of breath ECG Impression:  No significant ST segment change suggestive of ischemia. Comparison with Prior Nuclear Study: No significant change from previous study  Overall Impression:  Low risk stress nuclear study demonstrating a small region of upper-mid to basal mild ischemia with normal LV contractility unchanged from 2005.  LV Wall Motion:  NL LV Function, EF 74%; NL Wall Motion   KELLY,THOMAS A, MD  06/17/2014 4:40 PM

## 2014-06-17 NOTE — Telephone Encounter (Signed)
PATIENT IN OFFICE FOR A STRESS TEST.Spoke to patient.  Hgb Result given . Verbalized understanding

## 2014-06-19 ENCOUNTER — Other Ambulatory Visit: Payer: Self-pay | Admitting: Family Medicine

## 2014-06-19 NOTE — Telephone Encounter (Signed)
Very nice pt and I have no problme seeing him again but it will be a new patient visit

## 2014-06-20 ENCOUNTER — Telehealth: Payer: Self-pay | Admitting: Family Medicine

## 2014-06-20 DIAGNOSIS — D508 Other iron deficiency anemias: Secondary | ICD-10-CM

## 2014-06-20 MED ORDER — RIVAROXABAN 20 MG PO TABS: 20.0000 mg | ORAL_TABLET | Freq: Every day | ORAL | Status: DC

## 2014-06-20 NOTE — Telephone Encounter (Signed)
Patient says he saw Dr. Aundra Dubin on Friday and had a Stress Test.  Patient says he is to get those results on Tuesday and would like to wait until the Cardiology issue is cleared before starting back on the Xarelto. Please advise.

## 2014-06-20 NOTE — Telephone Encounter (Signed)
Call pt.  I talked with Dr. Beryle Beams.  Since the superficial clot didn't dissolve, I would restart the xarelto (stop ASA) for 6 weeks then repeat d-dimer and doppler study.  If extension of clot to deep vein, then give additional 6 weeks of therapy.   He'll need a repeat CBC in about 1 week given his troubles with anemia prev.  If any bleeding, then notify us in the meantime.  Thanks.  Orders are in.

## 2014-06-20 NOTE — Telephone Encounter (Signed)
Agreed, thanks.  And I'll route this conversation to Dr. Aundra Dubin with cardiology in the meantime.

## 2014-06-20 NOTE — Telephone Encounter (Signed)
Called and spoke to pt. Informed pt that we can schedule a new pt appt with PW because it has been a while since he has seen him. Pt verbalized understanding. Appt made for 07/25/2014 at 11:30 for a new pt appt, referral made by Dr. Aundra Dubin for pt's dyspnea, recs in [place]. Pt confirmed appt time and date and is aware to arrive 20-30 minutes before appt time. Nothing further needed.

## 2014-06-21 ENCOUNTER — Encounter (HOSPITAL_COMMUNITY): Payer: Self-pay | Admitting: Cardiology

## 2014-06-21 ENCOUNTER — Ambulatory Visit (HOSPITAL_BASED_OUTPATIENT_CLINIC_OR_DEPARTMENT_OTHER)
Admission: RE | Admit: 2014-06-21 | Discharge: 2014-06-21 | Disposition: A | Discharge status: Home / Self Care | Payer: Medicare Other | Source: Ambulatory Visit | Attending: Cardiology | Admitting: Cardiology

## 2014-06-21 ENCOUNTER — Telehealth (HOSPITAL_COMMUNITY): Payer: Self-pay | Admitting: Cardiology

## 2014-06-21 ENCOUNTER — Other Ambulatory Visit (HOSPITAL_COMMUNITY): Payer: Self-pay | Admitting: Adult Health

## 2014-06-21 ENCOUNTER — Encounter (HOSPITAL_COMMUNITY): Payer: Self-pay

## 2014-06-21 VITALS — BP 106/54 | HR 65 | Resp 18 | Wt 269.5 lb

## 2014-06-21 DIAGNOSIS — I2510 Atherosclerotic heart disease of native coronary artery without angina pectoris: Secondary | ICD-10-CM

## 2014-06-21 DIAGNOSIS — I82409 Acute embolism and thrombosis of unspecified deep veins of unspecified lower extremity: Secondary | ICD-10-CM

## 2014-06-21 DIAGNOSIS — I509 Heart failure, unspecified: Secondary | ICD-10-CM

## 2014-06-21 DIAGNOSIS — I5032 Chronic diastolic (congestive) heart failure: Secondary | ICD-10-CM

## 2014-06-21 DIAGNOSIS — I5023 Acute on chronic systolic (congestive) heart failure: Secondary | ICD-10-CM

## 2014-06-21 DIAGNOSIS — D649 Anemia, unspecified: Secondary | ICD-10-CM

## 2014-06-21 LAB — CBC WITH DIFFERENTIAL/PLATELET
BASOS ABS: 0.1 10*3/uL (ref 0.0–0.1)
Basophils Relative: 1 % (ref 0–1)
EOS ABS: 0.1 10*3/uL (ref 0.0–0.7)
Eosinophils Relative: 2 % (ref 0–5)
HEMATOCRIT: 36 % — AB (ref 39.0–52.0)
Hemoglobin: 10.9 g/dL — ABNORMAL LOW (ref 13.0–17.0)
LYMPHS PCT: 27 % (ref 12–46)
Lymphs Abs: 2 10*3/uL (ref 0.7–4.0)
MCH: 27 pg (ref 26.0–34.0)
MCHC: 30.3 g/dL (ref 30.0–36.0)
MCV: 89.1 fL (ref 78.0–100.0)
Monocytes Absolute: 0.7 10*3/uL (ref 0.1–1.0)
Monocytes Relative: 9 % (ref 3–12)
NEUTROS ABS: 4.4 10*3/uL (ref 1.7–7.7)
NEUTROS PCT: 61 % (ref 43–77)
Platelets: 210 10*3/uL (ref 150–400)
RBC: 4.04 MIL/uL — ABNORMAL LOW (ref 4.22–5.81)
RDW: 21.9 % — AB (ref 11.5–15.5)
WBC: 7.3 10*3/uL (ref 4.0–10.5)

## 2014-06-21 LAB — BASIC METABOLIC PANEL
BUN: 14 mg/dL (ref 6–23)
CALCIUM: 9.5 mg/dL (ref 8.4–10.5)
CO2: 24 mEq/L (ref 19–32)
Chloride: 100 mEq/L (ref 96–112)
Creatinine, Ser: 0.9 mg/dL (ref 0.50–1.35)
GFR, EST AFRICAN AMERICAN: 89 mL/min — AB (ref >90)
GFR, EST NON AFRICAN AMERICAN: 77 mL/min — AB (ref >90)
Glucose, Bld: 102 mg/dL — ABNORMAL HIGH (ref 70–99)
Potassium: 4.2 mEq/L (ref 3.7–5.3)
Sodium: 139 mEq/L (ref 137–147)

## 2014-06-21 LAB — PROTIME-INR
INR: 1 (ref 0.00–1.49)
Prothrombin Time: 13.2 seconds (ref 11.6–15.2)

## 2014-06-21 MED ORDER — PANTOPRAZOLE SODIUM 40 MG PO TBEC: 40.0000 mg | DELAYED_RELEASE_TABLET | Freq: Every day | ORAL | Status: DC

## 2014-06-21 MED ORDER — IRON POLYSACCH CMPLX-B12-FA 150-0.025-1 MG PO CAPS: 1.0000 | ORAL_CAPSULE | Freq: Two times a day (BID) | ORAL | Status: DC

## 2014-06-21 NOTE — Patient Instructions (Signed)
Your physician has requested that you have a cardiac catheterization. Cardiac catheterization is used to diagnose and/or treat various heart conditions. Doctors may recommend this procedure for a number of different reasons. The most common reason is to evaluate chest pain. Chest pain can be a symptom of coronary artery disease (CAD), and cardiac catheterization can show whether plaque is narrowing or blocking your heart's arteries. This procedure is also used to evaluate the valves, as well as measure the blood flow and oxygen levels in different parts of your heart. For further information please visit [email or web address]. Please follow instruction sheet, as given.  Labs today

## 2014-06-21 NOTE — Telephone Encounter (Signed)
Pt scheduled for R/L HC on 7/2 Cpt 93453 icd 9 [phone] With pts insurance Medicare A and B/ [employer]- no pre cert required

## 2014-06-21 NOTE — Progress Notes (Signed)
Patient ID: James [name], male   DOB: 04-11-1928, 81 y.o.   MRN: [id number] PCP: Dr. Damita Dunnings Pulmonologist: Dr. Joya Gaskins  80 yo with history of nonobstructive CAD and chronic diastolic CHF presents for cardiology followup.   In 11/14, he had a left leg DVT.  Only trigger seems to have been that he sat for 2 days in a row in a theater taking a test for the [facility] prior to the DVT.  He was on Xarelto for 6 months.    Follow up for Heart Failure: Had Cardiolite stress test since last visit and was referred to pulmonary. Volume status was elevated and lasix increased to 80 mg q am and 40 mg q pm. Had repeat dopplers revealing acute partially occlusive superficial thrombus in R lesser saphenous vein. Since increasing lasix has not noticed much increase in UOP. He does not weigh daily. Weight is down 10 lbs here but different scale from other office. No change in dyspnea. SOB with conversation. Able to walk from garage to desk before getting SOB. Daughter thinks SOB may have improved some. Denies orthopnea, PND or CP.   Patient has been on Lasix 80 mg daily for several years now.  He has chronic exertional dyspnea.  However, over the last few months, this has considerably worsened.  He had an echo in 12/14 with EF 60-65% and aortic sclerosis without significant stenosis.  He was admitted earlier this month with dyspnea. CTA chest was negative for PE.  Lower extremity dopplers showed a superficial thrombus but no DVT. He was found to be anemic with hemoglobin 7.9.  He got 1 unit PRBCs and IV iron.  He also was diuresed with IV Lasix.  He says that he really has not felt much better.  Repeat hemoglobin more recently was 9.4.  He has not had overt GI bleeding.  Currently, he is short of breath walking short distances such as around his house (bedroom to kitchen).  He was quite dyspneic walking into the office today.  No orthopnea or PND.  No chest pain or tightness.  No palpitations.  Lexiscan Cardiolite was done  in 6/15, showing basal to mid inferior mild ischemia (overall low risk study).   Labs (10/14): LDL 54, HDL 59 Labs (12/14): K 3.8, creatinine 0.9, HCT 40.3 Labs (6/15): K 3.7, creatinine 0.9, hemoglobin 7.9 => 9.4, BNP 83  PMH: 1. Carotid stenosis: Carotid dopplers (12/13) with 0-39% stenosis.  2. Hyperlipidemia 3. Gout 4. ITP: 5/10.  Thought to be due to Septra.  5. Obesity 6. OA 7. BPH 8. Nephrolithiasis 9. OSA 10. GERD 11. H/o melanoma 12. CAD: LHC (6/05) with EF 60%, 50% mLCx stenosis, 40% pLAD stenosis.  No intervention.  Lexiscan Cardiolite (6/15) with EF 74%, basal to mid inferior mild ischemia.   13. DVT (11/14): Spontaneous.  APLAS serologies negative.  6/15 [place] for DVT showed no DVT but did show a superficial thrombus right lesser saphenous vein. LE doppler (06/17/14): No evidence of lower extremity DVT or incompetence, bilaterally. Acute, partially occlusive superficial thrombus in the right lesser saphenous vein, similar to that described in the report from 05/30/14. Resolved left calf DVT. Chronic non-occlusive thrombus in the left lesser saphenous vein 14. Diastolic CHF: Echo (43/15) with EF 60-65%, aortic sclerosis without significant stenosis.  15. Symptomatic anemia 6/15, ?GI bleeding but nothing overt.   SH: Married, lives in [place], retired, 2 kids, quit smoking in 1968.   FH: h/o CVA and CAD  ROS: All systems reviewed  and negative except as per HPI.   Current Outpatient Prescriptions  Medication Sig Dispense Refill  . allopurinol (ZYLOPRIM) 300 MG tablet TAKE 1 TABLET DAILY  90 tablet  1  . Cholecalciferol (VITAMIN D3) 2000 UNITS capsule Take 2,000 Units by mouth daily.        . furosemide (LASIX) 40 MG tablet TAKE 80 MG IN THE A.M. AND 40 MG IN THE P.M.  90 tablet  3  . Iron Polysacch Cmplx-B12-FA 150-0.025-1 MG CAPS Take 1 capsule by mouth 2 (two) times daily.  180 each  3  . Misc Natural Products (OSTEO BI-FLEX JOINT SHIELD PO) Take 2 tablets by mouth  daily.       . Multiple Vitamin (MULTIVITAMIN) tablet Take 1 tablet by mouth daily.        . niacin (NIASPAN) 1000 MG CR tablet Take 1,000 mg by mouth at bedtime.      . nitroGLYCERIN (NITROSTAT) 0.4 MG SL tablet Place 1 tablet (0.4 mg total) under the tongue every 5 (five) minutes as needed. For chest pain  25 tablet  5  . pantoprazole (PROTONIX) 40 MG tablet Take 1 tablet (40 mg total) by mouth daily.  90 tablet  3  . Pitavastatin Calcium (LIVALO) 4 MG TABS Take 4 mg by mouth daily.      . potassium chloride SA (KLOR-CON M20) 20 MEQ tablet Take 1 tablet (20 mEq total) by mouth daily.  90 tablet  3  . Probiotic Product (PROBIOTIC & ACIDOPHILUS EX ST PO) Take 1 tablet by mouth daily.      . PSYLLIUM PO Take 20 mLs by mouth every evening. Powder. 4 teaspoonfuls every evening       . rivaroxaban (XARELTO) 20 MG TABS tablet Take 1 tablet (20 mg total) by mouth daily.      Marland Kitchen telmisartan (MICARDIS) 40 MG tablet Take 40 mg by mouth daily.       No current facility-administered medications for this encounter.    BP 106/54  Pulse 65  Resp 18  Wt 269 lb 8 oz (122.244 kg)  SpO2 98% General: NAD, obese Neck: Thick, JVP difficult but may be mildly elevated 8 cm, no thyromegaly or thyroid nodule.  Lungs: Clear to auscultation bilaterally with normal respiratory effort. CV: Nondisplaced PMI.  Heart regular S1/S2, no S3/S4, no murmur. 1+ edema 1/2 to knee bilaterally.  No carotid bruit.  Normal pedal pulses.  Abdomen: Soft, nontender, no hepatosplenomegaly, no distention.  Skin: Intact without lesions or rashes.  Neurologic: Alert and oriented x 3.  Psych: Normal affect. Extremities: No clubbing or cyanosis.   Assessment/Plan: 1. Dyspnea: This has considerably worsened over the last few months.  He was anemic in 6/15, but transfusion did not help much and last hemoglobin was up to 9.4 and he is still dyspneic. No PE on CTA chest earlier this month.  He was thought to be volume overloaded at last  appointment.  Lasix was increased but did not seem to help his symptoms much though weight is down. He does, of note, have a history of nonbstructive CAD.  He carries a history of OSA but does not use CPAP. He quit smoking years ago. Recent Lexiscan Cardiolite was not high risk but did not basal to mid inferior ischemia.   - I think that we need to definitively evaluate him for worsening CAD and for his filling pressures/PA pressures given his ongoing significant exertional symptoms.  I am going to arrange for LHC/RHC this week.  -  Continue Lasix to 80 qam, 40 qpm with KCl 20 daily. BMET today.  - I will have him followup with his pulmonologist.  2. Hyperlipidemia: Good lipids in 10/14.  Continue Livalo.  3. DVT: Occurred in 11/14.  Possibly triggered by long hours sitting in a theater seat taking a test.  Had 6 months of Xarelto, now back on ASA and off Xarelto.  Serologies for APLAS were negative.  CTA chest in 6/15 was negative for PE but he had a superficial thrombus on recent lower extremity dopplers.  Repeat dopplers also showed the presence of superficial thrombus last week (no DVT extension).  There is now consideration for treating him with Xarelto x 6 wks, but this has been put on hold for cardiac evaluation.  I will see what his cardiac cath looks like tomorrow.  If he needs PCI and dual antiplatelet agents, I will need to talk to Dr Beryle Beams about how vital Xarelto use will be.  Patient had recent anemia of uncertain etiology, no overt GI bleeding.  I will get CBC today.  4. Chronic diastolic CHF: NYHA class III symptoms, unchanged with increased Lasix despite lower weight.  JVP mildly elevated on exam.  - Continue current Lasix.  - RHC as above.  5. HTN: BP is controlled currently.   Loralie Champagne 06/21/2014

## 2014-06-22 ENCOUNTER — Encounter (HOSPITAL_COMMUNITY): Payer: Self-pay | Admitting: General Practice

## 2014-06-22 ENCOUNTER — Encounter (HOSPITAL_COMMUNITY)
Admission: RE | Disposition: A | Discharge status: Home / Self Care | Payer: Self-pay | Source: Ambulatory Visit | Attending: Cardiology

## 2014-06-22 ENCOUNTER — Ambulatory Visit (HOSPITAL_COMMUNITY)
Admission: RE | Admit: 2014-06-22 | Discharge: 2014-06-23 | Disposition: A | Discharge status: Home / Self Care | Payer: Medicare Other | Source: Ambulatory Visit | Attending: Cardiology | Admitting: Cardiology

## 2014-06-22 DIAGNOSIS — M129 Arthropathy, unspecified: Secondary | ICD-10-CM | POA: Insufficient documentation

## 2014-06-22 DIAGNOSIS — I25119 Atherosclerotic heart disease of native coronary artery with unspecified angina pectoris: Secondary | ICD-10-CM

## 2014-06-22 DIAGNOSIS — R9439 Abnormal result of other cardiovascular function study: Secondary | ICD-10-CM

## 2014-06-22 DIAGNOSIS — R0989 Other specified symptoms and signs involving the circulatory and respiratory systems: Secondary | ICD-10-CM | POA: Insufficient documentation

## 2014-06-22 DIAGNOSIS — Z9861 Coronary angioplasty status: Secondary | ICD-10-CM

## 2014-06-22 DIAGNOSIS — E785 Hyperlipidemia, unspecified: Secondary | ICD-10-CM | POA: Insufficient documentation

## 2014-06-22 DIAGNOSIS — I700 Atherosclerosis of aorta: Secondary | ICD-10-CM | POA: Insufficient documentation

## 2014-06-22 DIAGNOSIS — R269 Unspecified abnormalities of gait and mobility: Secondary | ICD-10-CM | POA: Insufficient documentation

## 2014-06-22 DIAGNOSIS — R0602 Shortness of breath: Secondary | ICD-10-CM | POA: Diagnosis present

## 2014-06-22 DIAGNOSIS — I5032 Chronic diastolic (congestive) heart failure: Secondary | ICD-10-CM | POA: Insufficient documentation

## 2014-06-22 DIAGNOSIS — N400 Enlarged prostate without lower urinary tract symptoms: Secondary | ICD-10-CM | POA: Insufficient documentation

## 2014-06-22 DIAGNOSIS — K219 Gastro-esophageal reflux disease without esophagitis: Secondary | ICD-10-CM | POA: Insufficient documentation

## 2014-06-22 DIAGNOSIS — I509 Heart failure, unspecified: Secondary | ICD-10-CM | POA: Insufficient documentation

## 2014-06-22 DIAGNOSIS — I8001 Phlebitis and thrombophlebitis of superficial vessels of right lower extremity: Secondary | ICD-10-CM

## 2014-06-22 DIAGNOSIS — D509 Iron deficiency anemia, unspecified: Secondary | ICD-10-CM | POA: Diagnosis present

## 2014-06-22 DIAGNOSIS — I82409 Acute embolism and thrombosis of unspecified deep veins of unspecified lower extremity: Secondary | ICD-10-CM

## 2014-06-22 DIAGNOSIS — R0609 Other forms of dyspnea: Secondary | ICD-10-CM | POA: Insufficient documentation

## 2014-06-22 DIAGNOSIS — G4733 Obstructive sleep apnea (adult) (pediatric): Secondary | ICD-10-CM | POA: Insufficient documentation

## 2014-06-22 DIAGNOSIS — D500 Iron deficiency anemia secondary to blood loss (chronic): Secondary | ICD-10-CM

## 2014-06-22 DIAGNOSIS — Z87891 Personal history of nicotine dependence: Secondary | ICD-10-CM | POA: Insufficient documentation

## 2014-06-22 DIAGNOSIS — Z85820 Personal history of malignant melanoma of skin: Secondary | ICD-10-CM | POA: Insufficient documentation

## 2014-06-22 DIAGNOSIS — I6529 Occlusion and stenosis of unspecified carotid artery: Secondary | ICD-10-CM | POA: Insufficient documentation

## 2014-06-22 DIAGNOSIS — I2510 Atherosclerotic heart disease of native coronary artery without angina pectoris: Secondary | ICD-10-CM

## 2014-06-22 DIAGNOSIS — Z86718 Personal history of other venous thrombosis and embolism: Secondary | ICD-10-CM | POA: Diagnosis present

## 2014-06-22 DIAGNOSIS — I10 Essential (primary) hypertension: Secondary | ICD-10-CM | POA: Insufficient documentation

## 2014-06-22 DIAGNOSIS — D693 Immune thrombocytopenic purpura: Secondary | ICD-10-CM | POA: Insufficient documentation

## 2014-06-22 DIAGNOSIS — D649 Anemia, unspecified: Secondary | ICD-10-CM | POA: Insufficient documentation

## 2014-06-22 DIAGNOSIS — I5023 Acute on chronic systolic (congestive) heart failure: Secondary | ICD-10-CM

## 2014-06-22 HISTORY — DX: Acute poliomyelitis, unspecified: A80.9

## 2014-06-22 HISTORY — DX: Personal history of other medical treatment: Z92.89

## 2014-06-22 HISTORY — DX: Heart failure, unspecified: I50.9

## 2014-06-22 HISTORY — DX: Iron deficiency anemia, unspecified: D50.9

## 2014-06-22 HISTORY — DX: Phlebitis and thrombophlebitis of superficial vessels of right lower extremity: I80.01

## 2014-06-22 HISTORY — PX: LEFT AND RIGHT HEART CATHETERIZATION WITH CORONARY ANGIOGRAM: SHX5449

## 2014-06-22 HISTORY — DX: Acute embolism and thrombosis of unspecified deep veins of unspecified lower extremity: I82.409

## 2014-06-22 HISTORY — DX: Gout, unspecified: M10.9

## 2014-06-22 HISTORY — PX: PERCUTANEOUS CORONARY STENT INTERVENTION (PCI-S): SHX5485

## 2014-06-22 HISTORY — PX: CORONARY ANGIOPLASTY WITH STENT PLACEMENT: SHX49

## 2014-06-22 HISTORY — DX: Pneumonia, unspecified organism: J18.9

## 2014-06-22 LAB — POCT I-STAT 3, VENOUS BLOOD GAS (G3P V)
Acid-base deficit: 2 mmol/L (ref 0.0–2.0)
Bicarbonate: 22.1 mEq/L (ref 20.0–24.0)
O2 SAT: 65 %
PO2 VEN: 34 mmHg (ref 30.0–45.0)
TCO2: 23 mmol/L (ref 0–100)
pCO2, Ven: 36.3 mmHg — ABNORMAL LOW (ref 45.0–50.0)
pH, Ven: 7.392 — ABNORMAL HIGH (ref 7.250–7.300)

## 2014-06-22 LAB — POCT I-STAT 3, ART BLOOD GAS (G3+)
BICARBONATE: 23.6 meq/L (ref 20.0–24.0)
O2 Saturation: 96 %
TCO2: 25 mmol/L (ref 0–100)
pCO2 arterial: 33.5 mmHg — ABNORMAL LOW (ref 35.0–45.0)
pH, Arterial: 7.455 — ABNORMAL HIGH (ref 7.350–7.450)
pO2, Arterial: 78 mmHg — ABNORMAL LOW (ref 80.0–100.0)

## 2014-06-22 LAB — POCT ACTIVATED CLOTTING TIME
ACTIVATED CLOTTING TIME: 157 s
Activated Clotting Time: 197 seconds
Activated Clotting Time: 242 seconds

## 2014-06-22 SURGERY — LEFT AND RIGHT HEART CATHETERIZATION WITH CORONARY ANGIOGRAM
Anesthesia: LOCAL

## 2014-06-22 MED ORDER — SODIUM CHLORIDE 0.9 % IJ SOLN: 3.0000 mL | Freq: Two times a day (BID) | INTRAMUSCULAR | Status: DC

## 2014-06-22 MED ORDER — FUROSEMIDE 40 MG PO TABS
40.0000 mg | ORAL_TABLET | Freq: Every day | ORAL | Status: DC
  Administered 2014-06-22: 20:00:00 40 mg via ORAL
  Filled 2014-06-22 (×2): qty 1

## 2014-06-22 MED ORDER — SODIUM CHLORIDE 0.9 % IJ SOLN: 3.0000 mL | INTRAMUSCULAR | Status: DC | PRN

## 2014-06-22 MED ORDER — LIDOCAINE HCL (PF) 1 % IJ SOLN
INTRAMUSCULAR | Status: AC
  Filled 2014-06-22: qty 30

## 2014-06-22 MED ORDER — IRBESARTAN 150 MG PO TABS
150.0000 mg | ORAL_TABLET | Freq: Every day | ORAL | Status: DC
  Administered 2014-06-23: 11:00:00 150 mg via ORAL
  Filled 2014-06-22: qty 1

## 2014-06-22 MED ORDER — HEPARIN SODIUM (PORCINE) 1000 UNIT/ML IJ SOLN
INTRAMUSCULAR | Status: AC
  Filled 2014-06-22: qty 1

## 2014-06-22 MED ORDER — FUROSEMIDE 40 MG PO TABS: 40.0000 mg | ORAL_TABLET | Freq: Every day | ORAL | Status: DC

## 2014-06-22 MED ORDER — PANTOPRAZOLE SODIUM 40 MG PO TBEC
40.0000 mg | DELAYED_RELEASE_TABLET | Freq: Every day | ORAL | Status: DC
  Administered 2014-06-22 – 2014-06-23 (×2): 40 mg via ORAL
  Filled 2014-06-22: qty 1

## 2014-06-22 MED ORDER — ASPIRIN 81 MG PO CHEW
81.0000 mg | CHEWABLE_TABLET | ORAL | Status: AC
  Administered 2014-06-22: 81 mg via ORAL

## 2014-06-22 MED ORDER — ASPIRIN 81 MG PO CHEW
CHEWABLE_TABLET | ORAL | Status: AC
  Administered 2014-06-22: 81 mg via ORAL
  Filled 2014-06-22: qty 1

## 2014-06-22 MED ORDER — FENTANYL CITRATE 0.05 MG/ML IJ SOLN
INTRAMUSCULAR | Status: AC
  Filled 2014-06-22: qty 2

## 2014-06-22 MED ORDER — ACETAMINOPHEN 325 MG PO TABS: 650.0000 mg | ORAL_TABLET | ORAL | Status: DC | PRN

## 2014-06-22 MED ORDER — POTASSIUM CHLORIDE CRYS ER 20 MEQ PO TBCR
20.0000 meq | EXTENDED_RELEASE_TABLET | Freq: Every day | ORAL | Status: DC
  Administered 2014-06-22 – 2014-06-23 (×2): 20 meq via ORAL
  Filled 2014-06-22 (×2): qty 1

## 2014-06-22 MED ORDER — MORPHINE SULFATE 2 MG/ML IJ SOLN: 2.0000 mg | INTRAMUSCULAR | Status: DC | PRN

## 2014-06-22 MED ORDER — TICAGRELOR 90 MG PO TABS
ORAL_TABLET | ORAL | Status: AC
  Filled 2014-06-22: qty 1

## 2014-06-22 MED ORDER — POTASSIUM CHLORIDE CRYS ER 20 MEQ PO TBCR: 20.0000 meq | EXTENDED_RELEASE_TABLET | Freq: Every day | ORAL | Status: DC

## 2014-06-22 MED ORDER — ADENOSINE (DIAGNOSTIC) 3 MG/ML IV SOLN
20.0000 mL | Freq: Once | INTRAVENOUS | Status: DC
  Filled 2014-06-22: qty 20

## 2014-06-22 MED ORDER — NITROGLYCERIN 0.4 MG SL SUBL: 0.4000 mg | SUBLINGUAL_TABLET | SUBLINGUAL | Status: DC | PRN

## 2014-06-22 MED ORDER — SODIUM CHLORIDE 0.9 % IV SOLN
INTRAVENOUS | Status: DC
  Administered 2014-06-22: 12:00:00 via INTRAVENOUS

## 2014-06-22 MED ORDER — HEPARIN (PORCINE) IN NACL 2-0.9 UNIT/ML-% IJ SOLN
INTRAMUSCULAR | Status: AC
  Filled 2014-06-22: qty 1000

## 2014-06-22 MED ORDER — FUROSEMIDE 40 MG PO TABS
80.0000 mg | ORAL_TABLET | Freq: Every day | ORAL | Status: DC
  Administered 2014-06-23: 80 mg via ORAL
  Filled 2014-06-22 (×2): qty 2

## 2014-06-22 MED ORDER — SODIUM CHLORIDE 0.9 % IV SOLN: 250.0000 mL | INTRAVENOUS | Status: DC | PRN

## 2014-06-22 MED ORDER — MIDAZOLAM HCL 2 MG/2ML IJ SOLN
INTRAMUSCULAR | Status: AC
  Filled 2014-06-22: qty 2

## 2014-06-22 MED ORDER — ASPIRIN 81 MG PO CHEW
81.0000 mg | CHEWABLE_TABLET | Freq: Every day | ORAL | Status: DC
  Administered 2014-06-23: 81 mg via ORAL
  Filled 2014-06-22: qty 1

## 2014-06-22 MED ORDER — NITROGLYCERIN 0.2 MG/ML ON CALL CATH LAB
INTRAVENOUS | Status: AC
  Filled 2014-06-22: qty 1

## 2014-06-22 MED ORDER — CLOPIDOGREL BISULFATE 75 MG PO TABS
75.0000 mg | ORAL_TABLET | Freq: Every day | ORAL | Status: DC
  Administered 2014-06-23: 75 mg via ORAL
  Filled 2014-06-22: qty 1

## 2014-06-22 MED ORDER — ONDANSETRON HCL 4 MG/2ML IJ SOLN: 4.0000 mg | Freq: Four times a day (QID) | INTRAMUSCULAR | Status: DC | PRN

## 2014-06-22 MED ORDER — MIDAZOLAM HCL 2 MG/2ML IJ SOLN
INTRAMUSCULAR | Status: AC
Start: 2014-06-22 — End: 2014-06-22
  Filled 2014-06-22: qty 2

## 2014-06-22 MED ORDER — VERAPAMIL HCL 2.5 MG/ML IV SOLN
INTRAVENOUS | Status: AC
  Filled 2014-06-22: qty 2

## 2014-06-22 MED ORDER — ALLOPURINOL 300 MG PO TABS
300.0000 mg | ORAL_TABLET | Freq: Every day | ORAL | Status: DC
  Administered 2014-06-23: 11:00:00 300 mg via ORAL
  Filled 2014-06-22: qty 1

## 2014-06-22 MED ORDER — SODIUM CHLORIDE 0.9 % IV SOLN: 1.0000 mL/kg/h | INTRAVENOUS | Status: AC

## 2014-06-22 NOTE — Procedures (Signed)
    Cardiac Catheterization Procedure Note  Name: James [name] MRN: [id number] DOB: [birth year]  Procedure: Right Heart Cath, Selective Coronary Angiography  Indication: Exertional dyspnea   Procedural Details: The right groin was prepped, draped, and anesthetized with 1% lidocaine. Using the modified Seldinger technique a 7 French sheath was placed in the right femoral vein. A Swan-Ganz catheter was used for the right heart catheterization. Standard protocol was followed for recording of right heart pressures and sampling of oxygen saturations. Fick cardiac output was calculated. Next, the right radial area was sterilely prepped and draped.  Using modified Seldinger technique, a 5 French sheath was placed in the right radial artery.  The patient received 3 mg intra-arterial verapamil and weight-based IV heparin.  Standard Judkins catheters were used for selective coronary angiography. There were no immediate procedural complications. The patient was transferred to the post catheterization recovery area for further monitoring.  Procedural Findings: Hemodynamics RA mean 2 RV 25/5 PA 22/8, mean 14 PCWP mean 7 AO 99/44  Oxygen saturations: PA 65% AO 96%  Cardiac Output (Fick) 6.9  Cardiac Index (Fick) 2.9   Coronary angiography: Coronary dominance: left  Left mainstem: Short with luminal irregularity.   Left anterior descending (LAD): 60-70% proximal LAD stenosis after the 1st diagonal.   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  30% mid LCx stenosis. Moderate OM1, moderate OM2, PLOM, and left PDA with luminal irregularities.   Right coronary artery (RCA): Small, nondominant RCA.   Left ventriculography: Not done, recent echo and Cardiolite with normal EF.    Final Conclusions:  Normal right and left heart filling pressures.  There is a moderate proximal LAD stenosis.  I am uncertain if this is hemodynamically significant.  I am going to arrange for FFR  to assess.  Dr. Ellyn Hack to do FFR.  If nonsignificant, suspect that his exertional dyspnea cannot be explained from a cardiac problem.   Followup in 2 wks CHF clinic with me.   Loralie Champagne 06/22/2014, 3:55 PM

## 2014-06-22 NOTE — H&P (View-Only) (Signed)
Patient ID: James [name], male   DOB: 1928/09/17, 80 y.o.   MRN: [id number] PCP: Dr. Damita Dunnings Pulmonologist: Dr. Joya Gaskins  80 yo with history of nonobstructive CAD and chronic diastolic CHF presents for cardiology followup.   In 11/14, he had a left leg DVT.  Only trigger seems to have been that he sat for 2 days in a row in a theater taking a test for the [facility] prior to the DVT.  He was on Xarelto for 6 months.    Follow up for Heart Failure: Had Cardiolite stress test since last visit and was referred to pulmonary. Volume status was elevated and lasix increased to 80 mg q am and 40 mg q pm. Had repeat dopplers revealing acute partially occlusive superficial thrombus in R lesser saphenous vein. Since increasing lasix has not noticed much increase in UOP. He does not weigh daily. Weight is down 10 lbs here but different scale from other office. No change in dyspnea. SOB with conversation. Able to walk from garage to desk before getting SOB. Daughter thinks SOB may have improved some. Denies orthopnea, PND or CP.   Patient has been on Lasix 80 mg daily for several years now.  He has chronic exertional dyspnea.  However, over the last few months, this has considerably worsened.  He had an echo in 12/14 with EF 60-65% and aortic sclerosis without significant stenosis.  He was admitted earlier this month with dyspnea. CTA chest was negative for PE.  Lower extremity dopplers showed a superficial thrombus but no DVT. He was found to be anemic with hemoglobin 7.9.  He got 1 unit PRBCs and IV iron.  He also was diuresed with IV Lasix.  He says that he really has not felt much better.  Repeat hemoglobin more recently was 9.4.  He has not had overt GI bleeding.  Currently, he is short of breath walking short distances such as around his house (bedroom to kitchen).  He was quite dyspneic walking into the office today.  No orthopnea or PND.  No chest pain or tightness.  No palpitations.  Lexiscan Cardiolite was done  in 6/15, showing basal to mid inferior mild ischemia (overall low risk study).   Labs (10/14): LDL 54, HDL 59 Labs (12/14): K 3.8, creatinine 0.9, HCT 40.3 Labs (6/15): K 3.7, creatinine 0.9, hemoglobin 7.9 => 9.4, BNP 83  PMH: 1. Carotid stenosis: Carotid dopplers (12/13) with 0-39% stenosis.  2. Hyperlipidemia 3. Gout 4. ITP: 5/10.  Thought to be due to Septra.  5. Obesity 6. OA 7. BPH 8. Nephrolithiasis 9. OSA 10. GERD 11. H/o melanoma 12. CAD: LHC (6/05) with EF 60%, 50% mLCx stenosis, 40% pLAD stenosis.  No intervention.  Lexiscan Cardiolite (6/15) with EF 74%, basal to mid inferior mild ischemia.   13. DVT (11/14): Spontaneous.  APLAS serologies negative.  6/15 [place] for DVT showed no DVT but did show a superficial thrombus right lesser saphenous vein. LE doppler (06/17/14): No evidence of lower extremity DVT or incompetence, bilaterally. Acute, partially occlusive superficial thrombus in the right lesser saphenous vein, similar to that described in the report from 05/30/14. Resolved left calf DVT. Chronic non-occlusive thrombus in the left lesser saphenous vein 14. Diastolic CHF: Echo (02/58) with EF 60-65%, aortic sclerosis without significant stenosis.  15. Symptomatic anemia 6/15, ?GI bleeding but nothing overt.   SH: Married, lives in [place], retired, 2 kids, quit smoking in 1968.   FH: h/o CVA and CAD  ROS: All systems reviewed  and negative except as per HPI.   Current Outpatient Prescriptions  Medication Sig Dispense Refill  . allopurinol (ZYLOPRIM) 300 MG tablet TAKE 1 TABLET DAILY  90 tablet  1  . Cholecalciferol (VITAMIN D3) 2000 UNITS capsule Take 2,000 Units by mouth daily.        . furosemide (LASIX) 40 MG tablet TAKE 80 MG IN THE A.M. AND 40 MG IN THE P.M.  90 tablet  3  . Iron Polysacch Cmplx-B12-FA 150-0.025-1 MG CAPS Take 1 capsule by mouth 2 (two) times daily.  180 each  3  . Misc Natural Products (OSTEO BI-FLEX JOINT SHIELD PO) Take 2 tablets by mouth  daily.       . Multiple Vitamin (MULTIVITAMIN) tablet Take 1 tablet by mouth daily.        . niacin (NIASPAN) 1000 MG CR tablet Take 1,000 mg by mouth at bedtime.      . nitroGLYCERIN (NITROSTAT) 0.4 MG SL tablet Place 1 tablet (0.4 mg total) under the tongue every 5 (five) minutes as needed. For chest pain  25 tablet  5  . pantoprazole (PROTONIX) 40 MG tablet Take 1 tablet (40 mg total) by mouth daily.  90 tablet  3  . Pitavastatin Calcium (LIVALO) 4 MG TABS Take 4 mg by mouth daily.      . potassium chloride SA (KLOR-CON M20) 20 MEQ tablet Take 1 tablet (20 mEq total) by mouth daily.  90 tablet  3  . Probiotic Product (PROBIOTIC & ACIDOPHILUS EX ST PO) Take 1 tablet by mouth daily.      . PSYLLIUM PO Take 20 mLs by mouth every evening. Powder. 4 teaspoonfuls every evening       . rivaroxaban (XARELTO) 20 MG TABS tablet Take 1 tablet (20 mg total) by mouth daily.      Marland Kitchen telmisartan (MICARDIS) 40 MG tablet Take 40 mg by mouth daily.       No current facility-administered medications for this encounter.    BP 106/54  Pulse 65  Resp 18  Wt 269 lb 8 oz (122.244 kg)  SpO2 98% General: NAD, obese Neck: Thick, JVP difficult but may be mildly elevated 8 cm, no thyromegaly or thyroid nodule.  Lungs: Clear to auscultation bilaterally with normal respiratory effort. CV: Nondisplaced PMI.  Heart regular S1/S2, no S3/S4, no murmur. 1+ edema 1/2 to knee bilaterally.  No carotid bruit.  Normal pedal pulses.  Abdomen: Soft, nontender, no hepatosplenomegaly, no distention.  Skin: Intact without lesions or rashes.  Neurologic: Alert and oriented x 3.  Psych: Normal affect. Extremities: No clubbing or cyanosis.   Assessment/Plan: 1. Dyspnea: This has considerably worsened over the last few months.  He was anemic in 6/15, but transfusion did not help much and last hemoglobin was up to 9.4 and he is still dyspneic. No PE on CTA chest earlier this month.  He was thought to be volume overloaded at last  appointment.  Lasix was increased but did not seem to help his symptoms much though weight is down. He does, of note, have a history of nonbstructive CAD.  He carries a history of OSA but does not use CPAP. He quit smoking years ago. Recent Lexiscan Cardiolite was not high risk but did not basal to mid inferior ischemia.   - I think that we need to definitively evaluate him for worsening CAD and for his filling pressures/PA pressures given his ongoing significant exertional symptoms.  I am going to arrange for LHC/RHC this week.  -  Continue Lasix to 80 qam, 40 qpm with KCl 20 daily. BMET today.  - I will have him followup with his pulmonologist.  2. Hyperlipidemia: Good lipids in 10/14.  Continue Livalo.  3. DVT: Occurred in 11/14.  Possibly triggered by long hours sitting in a theater seat taking a test.  Had 6 months of Xarelto, now back on ASA and off Xarelto.  Serologies for APLAS were negative.  CTA chest in 6/15 was negative for PE but he had a superficial thrombus on recent lower extremity dopplers.  Repeat dopplers also showed the presence of superficial thrombus last week (no DVT extension).  There is now consideration for treating him with Xarelto x 6 wks, but this has been put on hold for cardiac evaluation.  I will see what his cardiac cath looks like tomorrow.  If he needs PCI and dual antiplatelet agents, I will need to talk to Dr Beryle Beams about how vital Xarelto use will be.  Patient had recent anemia of uncertain etiology, no overt GI bleeding.  I will get CBC today.  4. Chronic diastolic CHF: NYHA class III symptoms, unchanged with increased Lasix despite lower weight.  JVP mildly elevated on exam.  - Continue current Lasix.  - RHC as above.  5. HTN: BP is controlled currently.   Loralie Champagne 06/21/2014

## 2014-06-22 NOTE — CV Procedure (Signed)
PERCUTANEOUS CORONARY INTERVENTION REPORT  NAME:  James [name]   MRN: [id number] DOB:  [birth year]   ADMIT DATE: 06/22/2014 Procedure Date: 06/22/2014  INTERVENTIONAL CARDIOLOGIST: Leonie Man, M.D., MS PRIMARY CARE PROVIDER: [name], MD PRIMARY CARDIOLOGIST: Loralie Champagne, MD  PATIENT:  James [name] is a 80 y.o. male with history of nonobstructive CAD and chronic diastolic CHF presents for cardiology followup. In 11/14, he had a left leg DVT. Only trigger seems to have been that he sat for 2 days in a row in a theater taking a test for the [facility] prior to the DVT. He was on Xarelto for 6 months.  He saw Dr. Aundra Dubin to evaluate his dyspnea and had a Cardiolite ST that demonstrated basal to mid inferior ischemia, but Low Risk.  Despite these findings, he has continued to have progressive exertional dyspnea - Class III-IV. He had Diagnostic Right Heart Cath & Coronary Angiography that revealed relatively normal RHC pressures, but a ~60-70% early mid LAD (just after D1) lesion as the only potential culprit. I reviewed the images & agree that it is an intermediate lesion, but due to the patient's level of symptoms, warrants additional evaluation.  We decided to proceed with Fractional Flow Reserve measurement & PCI if indicated. I briefly talked with the family.  PRE-OPERATIVE DIAGNOSIS:    Exertional Dyspnea - Class III-IV (Unsure if CHF or Ischemic CAD mediated)  Abnormal Stress Test  PROCEDURES PERFORMED:    Fractional Flow Reserve (FFR) Measurement of mid LAD (after D1) 60-70% lesion -- Ratio of 0.73  FFR guided Percutaneous Coronary Intervention of mid LAD lesion   PROCEDURE:  Time Out: Verified patient identification, verified procedure, site/side was marked, verified correct patient position, special equipment/implants available, medications/allergies/relevent history reviewed, required imaging and test results available. Performed.  Sheath removed in the Cath Lab  with TR Band for hemostasis.  TR Band: 1700  Hours; 13 mL air  FINDINGS:  Hemodynamics:   Central Aortic Pressure / Mean: 119/53/79 mmHg  Coronary Anatomy: Reviewed from Dr. Claris Gladden note  Left mainstem: Short with luminal irregularity.   Left Anterior Descending (LAD): 60-70% proximal LAD stenosis after the 1st diagonal.  The remainder of the vessel is relatively free of disease.  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 30% mid LCx stenosis. Moderate OM1, moderate OM2, PLOM, and left PDA with luminal irregularities   After reviewing the initial angiography, thepotential culprit lesion was thought to be mid LAD 60-70% stenosis (inferior ischemia could be from the Wrap-around LAD).  Preparation were made to proceed with FFR guided possible PCI on this lesion.  Percutaneous Coronary Intervention:   Guide: 6 Fr   JR4 Guidewire: Volcano PrimeWire Mee Hives) Adenosine infusion for 90 Sec @ 146mg/kg.min --> Ratio from 0.92 dropped to 0.73 at 83 Sec --> Physiologically Significant.  Proceed with PCI  Predilation Balloon: Emerge 3.0 mm x 12 mm;   12 Atm x 30 Sec,  The lesion dog-boned initially -- after inflation, there was a focal dissection that did not spread.  Stent: Xience Alpine DES 3.25 mm x 15 mm;   Deployed at 12 Atm x 30 Sec  Post-dilated with Stent Balloon: 16 Atm x 30 SecFinal Diameter -- 3.45 mm  Post deployment angiography in multiple views, with and without guidewire in place revealed excellent stent deployment and lesion coverage.  There was no evidence of dissection or perforation.  MEDICATIONS:  Additional Omnipaque Contrast: 30 ml  Anticoagulation:  IV Heparin 1000 Units --  ACT > 250 Sec  Anti-Platelet Agent:  Brilinta 180 mg (convert to Plavix in AM)  PATIENT DISPOSITION:    The patient was transferred to the PACU holding area in a hemodynamicaly stable, chest pain free condition.  The patient tolerated the procedure well, and  there were no complications.  EBL:   < 5 ml  The patient was stable before, during, and after the procedure.  POST-OPERATIVE DIAGNOSIS:    Hemodynamically significant early mid LAD lesion with FFR of 0.73, treated with Xience Element DES 3.25 mm x 15 mm, post-dilated to 3.45 mm  PLAN OF CARE:  Overnight Observation post PCI - post Radial Cath /PCI care.  Convert to Plavix in AM & restart Xarelto if indicated tomorrow PM.  Anticipate d/c in AM  Continue other home mediations.   Leonie Man, M.D., M.S. [facility] [address] [place], [place]  [address]  [phone]  06/22/2014 5:04 PM

## 2014-06-22 NOTE — Progress Notes (Signed)
Site area: right groin  Site Prior to Removal:  Level 0  Pressure Applied For 10 MINUTES    Minutes Beginning at 1900  Manual:   Yes.    Patient Status During Pull:  AAO x4  Post Pull Groin Site:  Level 0  Post Pull Instructions Given:  Yes.    Post Pull Pulses Present:  Yes.    Dressing Applied:  Yes.    Comments:  VENOUS SHEATH REMOVAL, PT TOLERATED PROCEDURE WELL

## 2014-06-22 NOTE — Interval H&P Note (Signed)
Cath Lab Visit (complete for each Cath Lab visit)  Clinical Evaluation Leading to the Procedure:   ACS: No.  Non-ACS:    Anginal Classification: CCS III  Anti-ischemic medical therapy: Minimal Therapy (1 class of medications)  Non-Invasive Test Results: Low risk   Prior CABG: No previous CABG      History and Physical Interval Note:  06/22/2014 2:49 PM  James [name]  has presented today for surgery, with the diagnosis of heart failure  The various methods of treatment have been discussed with the patient and family. After consideration of risks, benefits and other options for treatment, the patient has consented to  Procedure(s): LEFT AND RIGHT HEART CATHETERIZATION WITH CORONARY ANGIOGRAM (N/A) as a surgical intervention .  The patient's history has been reviewed, patient examined, no change in status, stable for surgery.  I have reviewed the patient's chart and labs.  Questions were answered to the patient's satisfaction.     D[place] [employer]

## 2014-06-23 ENCOUNTER — Encounter (HOSPITAL_COMMUNITY): Payer: Medicare Other

## 2014-06-23 ENCOUNTER — Telehealth: Payer: Self-pay | Admitting: Cardiology

## 2014-06-23 ENCOUNTER — Encounter (HOSPITAL_COMMUNITY): Payer: Self-pay | Admitting: Oncology

## 2014-06-23 DIAGNOSIS — D509 Iron deficiency anemia, unspecified: Secondary | ICD-10-CM

## 2014-06-23 DIAGNOSIS — I209 Angina pectoris, unspecified: Secondary | ICD-10-CM

## 2014-06-23 DIAGNOSIS — Z86718 Personal history of other venous thrombosis and embolism: Secondary | ICD-10-CM

## 2014-06-23 DIAGNOSIS — I82819 Embolism and thrombosis of superficial veins of unspecified lower extremities: Secondary | ICD-10-CM

## 2014-06-23 DIAGNOSIS — I8001 Phlebitis and thrombophlebitis of superficial vessels of right lower extremity: Secondary | ICD-10-CM

## 2014-06-23 HISTORY — DX: Phlebitis and thrombophlebitis of superficial vessels of right lower extremity: I80.01

## 2014-06-23 LAB — CBC
HCT: 33.4 % — ABNORMAL LOW (ref 39.0–52.0)
Hemoglobin: 10.3 g/dL — ABNORMAL LOW (ref 13.0–17.0)
MCH: 27.3 pg (ref 26.0–34.0)
MCHC: 30.8 g/dL (ref 30.0–36.0)
MCV: 88.6 fL (ref 78.0–100.0)
Platelets: 182 10*3/uL (ref 150–400)
RBC: 3.77 MIL/uL — ABNORMAL LOW (ref 4.22–5.81)
RDW: 22.3 % — AB (ref 11.5–15.5)
WBC: 6.4 10*3/uL (ref 4.0–10.5)

## 2014-06-23 LAB — CBC WITH DIFFERENTIAL/PLATELET
BASOS ABS: 0 10*3/uL (ref 0.0–0.1)
Basophils Relative: 0 % (ref 0–1)
EOS ABS: 0.1 10*3/uL (ref 0.0–0.7)
Eosinophils Relative: 1 % (ref 0–5)
HCT: 34.9 % — ABNORMAL LOW (ref 39.0–52.0)
Hemoglobin: 10.7 g/dL — ABNORMAL LOW (ref 13.0–17.0)
LYMPHS ABS: 1.4 10*3/uL (ref 0.7–4.0)
Lymphocytes Relative: 18 % (ref 12–46)
MCH: 26.7 pg (ref 26.0–34.0)
MCHC: 30.7 g/dL (ref 30.0–36.0)
MCV: 87 fL (ref 78.0–100.0)
Monocytes Absolute: 0.6 10*3/uL (ref 0.1–1.0)
Monocytes Relative: 8 % (ref 3–12)
Neutro Abs: 5.6 10*3/uL (ref 1.7–7.7)
Neutrophils Relative %: 73 % (ref 43–77)
PLATELETS: 211 10*3/uL (ref 150–400)
RBC: 4.01 MIL/uL — ABNORMAL LOW (ref 4.22–5.81)
RDW: 22.2 % — AB (ref 11.5–15.5)
WBC: 7.7 10*3/uL (ref 4.0–10.5)

## 2014-06-23 LAB — BASIC METABOLIC PANEL
ANION GAP: 13 (ref 5–15)
BUN: 17 mg/dL (ref 6–23)
CALCIUM: 9 mg/dL (ref 8.4–10.5)
CO2: 23 mEq/L (ref 19–32)
CREATININE: 0.92 mg/dL (ref 0.50–1.35)
Chloride: 102 mEq/L (ref 96–112)
GFR, EST AFRICAN AMERICAN: 88 mL/min — AB (ref >90)
GFR, EST NON AFRICAN AMERICAN: 76 mL/min — AB (ref >90)
Glucose, Bld: 125 mg/dL — ABNORMAL HIGH (ref 70–99)
Potassium: 4.2 mEq/L (ref 3.7–5.3)
Sodium: 138 mEq/L (ref 137–147)

## 2014-06-23 LAB — IRON AND TIBC
IRON: 55 ug/dL (ref 42–135)
Saturation Ratios: 21 % (ref 20–55)
TIBC: 260 ug/dL (ref 215–435)
UIBC: 205 ug/dL (ref 125–400)

## 2014-06-23 LAB — LACTATE DEHYDROGENASE: LDH: 213 U/L (ref 94–250)

## 2014-06-23 LAB — RETICULOCYTES
RBC.: 4.01 MIL/uL — ABNORMAL LOW (ref 4.22–5.81)
Retic Count, Absolute: 72.2 10*3/uL (ref 19.0–186.0)
Retic Ct Pct: 1.8 % (ref 0.4–3.1)

## 2014-06-23 LAB — FERRITIN: FERRITIN: 91 ng/mL (ref 22–322)

## 2014-06-23 MED ORDER — ACETAMINOPHEN 325 MG PO TABS: 650.0000 mg | ORAL_TABLET | ORAL | Status: DC | PRN

## 2014-06-23 MED ORDER — ASPIRIN 81 MG PO CHEW: 81.0000 mg | CHEWABLE_TABLET | Freq: Every day | ORAL | Status: DC

## 2014-06-23 MED ORDER — FUROSEMIDE 40 MG PO TABS: 40.0000 mg | ORAL_TABLET | Freq: Every day | ORAL | Status: DC

## 2014-06-23 MED ORDER — CLOPIDOGREL BISULFATE 75 MG PO TABS: 75.0000 mg | ORAL_TABLET | Freq: Every day | ORAL | Status: DC

## 2014-06-23 NOTE — Consult Note (Signed)
Referring MD: And Dr. Rowland Lathe PCP:  [name], MD   Reason for Referral: Advice on anticoagulation    HPI:  Pleasant 80 year old man well known to me. I evaluated him in 2010  when he presented with marked thrombocytopenia. This was likely a reaction to Septra. He had a prompt normalization of his platelet count with a short course of steroids. I evaluated him again in December 2014. He developed left ankle swelling. A D.-dimer test was elevated. A venous Doppler study done 11/01/2013 showed a clot in his posterior tibial and peroneal veins. He was anticoagulated for 6 months with Xarelto. Just one week ago I received a communication from his primary care physician that he had sustained a superficial thrombosis in the right greater saphenous vein. I recommended a short 6 week course of Xarelto and then a followup d-dimer and Doppler study. Behind the scenes, he was under evaluation for progressive dyspnea with minimal exertion occurring over the last year. He was admitted here on 05/29/2014 with acute dyspnea. A CT scan of the chest did not show any evidence of pulmonary embolus or any pulmonary parenchymal mass or adenopathy. There were some fibrotic changes and changes consistent with obstructive airway disease. He is a former smoker who stopped smoking many years ago in 1968. Cardiac evaluation has revealed diastolic dysfunction with an ejection fraction of 60-65%.  Of note at time of the June 7 admission hemoglobin was 7.9 compared with 13.0 on 11/24/2013. Stools are guaiac negative. He had no GI symptoms. He denied seeing any dark stools. No hematochezia. He last had a colonoscopy in August 2010 which showed no gross pathology. He was given a 1 unit blood transfusion and a dose of parenteral iron and then sent home on oral iron.  Routine lower extremity Doppler studies were done during that admission on June 8 and showed a superficial thrombus in the right lesser saphenous vein.  Patient was asymptomatic. He was not be back on anticoagulation but continued his aspirin. A followup Doppler study done on June 26 showed no change and specifically no extension of the clot into the deep veins.  Due to progressive symptoms, he was admitted for a elective cardiac catheterization done on July 1. This showed a 60-70% proximal left anterior descending coronary artery lesion and a 30% mid circumflex artery lesion. A fractional flow study was done and he was felt to be a candidate for angioplasty which was then performed on the LAD. He received IV heparin and Brilinta.  He has had some response to the recent iron and transfusion therapy with hemoglobin 10.7 MCV 86 recorded on June 25. White count, white count differential, and platelet count are normal.  He was told that he might of had a subacute GI bleed secondary to the Xarelto that he took after his initial clot. He has had no gastritis symptoms. His stools no black secondary to the iron. He has had no weight loss. No other constitutional symptoms.        Past Medical History  Diagnosis Date  . Bradycardia     Hypertensive  . carotid bruit, right   . hypercholesterolemia     Trig 234/293;takes Simcor daily  . Obesity, morbid   . Vasculitis   . 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 Beryle Beams; Dr. Beryle Beams , normal platelets  . Dermatitis     legs  . Blood in stool   . Bleeding gums   . Chronic low back  pain   . Myalgia and myositis   . Unspecified vitamin D deficiency   . Bladder diverticulum   . Primary osteoarthritis of both knees   . Nephrolithiasis   . History of elevated glucose   . Thrombophlebitis     right forearm  . History of BPH   . History of colonic polyps     [name]  . Epididymitis, left     S/P Epidimectomy  . Splenomegaly 05/15/09    abd U/S NML  . Hypertension, benign     takes Micardis daily  . Neuropathy     acute  . Bruises easily   .  H/O hiatal hernia   . GERD (gastroesophageal reflux disease)     hx of but doesn't take any meds now  . Urinary frequency   . Urinary urgency   . Nocturia   . Urinary leakage   . Abnormality of gait 03/19/2013  . DVT of lower limb, acute 11/19/2013    Peroneal & distal tibial left 11/01/13  . Polio 1941    "mild case"  . CHF (congestive heart failure)     "low grade" (06/22/2014)  . DVT (deep venous thrombosis) 10/2013    LLE  . Pneumonia, bacterial     RML resolved, spirometry, normal  . Pneumonia 1933  . History of blood transfusion 05/2014  . Anemia 05/2014    "related to Xarelto"  . Iron deficiency anemia 05/2014    "got iron infusion"  . Arthritis     "all over"  . Gout   . Malignant melanoma of ear     right  :  Past Surgical History  Procedure Laterality Date  . Cystoscopy w/ stone manipulation  1950's  . Fem cutaneous nerve entrapment  1977    due to surgery (mild lat anesth of bilat legs)  . Limited pelvis/hip bone scan  04/99    negative  . Bone scan  04/00  . Cataract extraction w/ intraocular lens  implant, bilateral Bilateral ~ 2004  . Melanoma excision Right 06/04    ear; Wide local excision,,with flap  . Stress cardiolite  05/11/04    mild inf. ischemia, EF 71%  . Shoulder surgery Left 06/17/2005    "tore it all to pieces when I fell; not fractured"  . Bronchoscopy  09/08    RML collapse with chronic pneumonia, bx neg (Dr. Joya Gaskins)  . Cyst excision Right 03/07/09    middle finger, DIP Joint Mucoid (Dr. Fredna Dow)  . Eyelid & eyebrow lift  1996  . Cyst excision Right 08/15/09    middle finger, DIP Joint Mucoid Cyst (Dr. Fredna Dow)  . Appendectomy  1950  . Excision of mucoid tumor    . Colonoscopy    . Total knee arthroplasty  01/10/2012    Procedure: TOTAL KNEE ARTHROPLASTY;  Surgeon: Augustin Schooling, MD;  Location: [facility];  Service: Orthopedics;  Laterality: Left;  . Surgery scrotal / testicular Left ~ 2000    removal of mass, noncancerous  . Cardiac  catheterization  05/23/04    mild plague-statin  . Coronary angioplasty with stent placement  06/22/2014    "1"  . Inguinal hernia repair Bilateral 1959  . Inguinal hernia repair Left 1970's  :  . allopurinol  300 mg Oral Daily  . aspirin  81 mg Oral Daily  . clopidogrel  75 mg Oral Q breakfast  . furosemide  40 mg Oral q1800  . furosemide  80 mg Oral QAC breakfast  .  irbesartan  150 mg Oral Daily  . pantoprazole  40 mg Oral Daily  . potassium chloride SA  20 mEq Oral Daily  . sodium chloride  3 mL Intravenous Q12H  :  Allergies  Allergen Reactions  . Neomycin-Bacitracin Zn-Polymyx     Other reaction(s): RASH  . Penicillins     Other reaction(s): OTHER REACTION: Rash, swelling  . Rofecoxib     Other reaction(s): NOT KNOWN  . Sulfa Antibiotics     Other reaction(s): OTHER  . Sulfamethoxazole-Trimethoprim     REACTION: questional onset of low platelets and bleeding  . Tramadol Hcl     REACTION: dizziness, drowsiness  . Neosporin [Neomycin-Polymyxin-Gramicidin] Rash  :  Family History  Problem Relation Age of Onset  . Thyroid cancer Mother   . Diabetes Father   . Heart disease Father   . Stroke Brother     cerebral hemm  . Colon cancer Brother   . Anesthesia problems Neg Hx   . Hypotension Neg Hx   . Malignant hyperthermia Neg Hx   . Pseudochol deficiency Neg Hx   . Prostate cancer Neg Hx   . Heart attack Father   :  History   Social History  . Marital Status: Widowed    Spouse Name: N/A    Number of Children: 2  . Years of Education: college   Occupational History  . Retired     1996 [employer]   Social History Main Topics  . Smoking status: Former Smoker -- 2.00 packs/day for 20 years    Types: Cigarettes    Quit date: 09/27/1967  . Smokeless tobacco: Never Used  . Alcohol Use: 0.0 oz/week     Comment: "glass of wine a few times/yr"  . Drug Use: No  . Sexual Activity: No   Other Topics Concern  . Not on file   Social History  Narrative   Widowed 2013 after 63+ years of marriage, Wife had CHF and multiple myeloma   [name], daughter in Sports coach, lives with patient   Patient does not get regular exercise   No caffeine   Retired from [employer] ([employer]), retired from [employer]   :  ROS: See history of present illness    Vitals: Filed Vitals:   06/23/14 0753  BP: 143/53  Pulse: 70  Temp: 97.8 F (36.6 C)  Resp: 18    PHYSICAL EXAM: General appearance: Pleasant, obese, Caucasian man HEENT: Pharynx no erythema exudate or ulcer Lymph Nodes: No cervical supraclavicular or axillary adenopathy Resp: Clear to auscultation resonant to percussion Cardio: Regular rhythm no murmur gallop or rub Vascular: Carotids 2+ no bruits. There is a palpable cord along about a 6 or 8 cm area medial proximal right thigh. Breasts: GI: Abdomen is soft, tender in the epigastric region, no obvious mass or organomegaly GU: Extremities: No edema, no calf tenderness.  Neurologic: Grossly normal Skin: No rash or ecchymosis  Labs:   Recent Labs  06/21/14 1500 06/23/14 0320  WBC 7.3 6.4  HGB 10.9* 10.3*  HCT 36.0* 33.4*  PLT 210 182    Recent Labs  06/21/14 1500 06/23/14 0320  NA 139 138  K 4.2 4.2  CL 100 102  CO2 24 23  GLUCOSE 102* 125*  BUN 14 17  CREATININE 0.90 0.92  CALCIUM 9.5 9.0    Blood smear review: pending  Images Studies/Results:  No results found.      Assessment: Principal Problem:   SOB (  shortness of breath) Active Problems:   OBESITY, MORBID   HYPERTENSION, BENIGN   HLD (hyperlipidemia)   Abnormality of gait   DVT (deep venous thrombosis)- off anticoagulation now   Chronic diastolic CHF (congestive heart failure)   Anemia- Dr Beryle Beams follows   CAD S/P LAD DES after FFR 06/22/14   Impression: #1. Initial unprovoked left lower extremity DVT November 2014 treated with 6 months of Xarelto anticoagulation. Evaluation at that time unremarkable for any other risk factors  except his age. Second, superficial thrombosis, right greater saphenous vein found incidentally on a survey Doppler study done on 05/30/2014. On exam this is indeed quite superficial and the vein islready thrombosed. No evidence of extension to deep veins on lowup study done June 26. Recommendation: With respect to anticoagulation,now that he has a coronary stent after examining the patient, I feel that he is at very low risk for recurrent DVT and that we do not need to keep him on the Xarelto. I think the aspirin and Plavix that he will be put on to prevent stent rethrombosis is sufficient.  #2. Interval development of iron deficiency anemia Although it is certainly possible that the Xarelto did cause subacute GI bleeding, and his hemoglobin appears to be responding to iron therapy, it is 5 years since his last endoscopy. t would be. reasonable to schedule him for a routine upper endoscopy and colonoscopy once his cardiac condition stabilizes. I would continue his oral iron for now.   Thank you for this consultation   Weslyn Holsonback M 06/23/2014, 8:40 AM

## 2014-06-23 NOTE — Discharge Instructions (Signed)
Coronary Angiogram with Stent Coronary angiography with stent placement is a procedure to widen or open a narrow blood vessel of the heart (coronary artery). When a coronary artery becomes partially blocked, it decreases blood flow to that area. This may lead to chest pain or a heart attack (myocardial infarction). Arteries may become blocked by cholesterol buildup (plaque) in the lining or wall.  A stent is a small piece of metal that looks like a mesh or a spring. Stent placement may be done right after a coronary angiography in which a blocked artery is found or as a treatment for a heart attack.  LET [facility] CARE PROVIDER KNOW ABOUT:  Any allergies you have.   All medicines you are taking, including vitamins, herbs, eye drops, creams, and over-the-counter medicines.   Previous problems you or members of your family have had with the use of anesthetics.   Any blood disorders you have.   Previous surgeries you have had.   Medical conditions you have. RISKS AND COMPLICATIONS Generally, coronary angiography with stent is a safe procedure. However, as with any procedure, complications can occur. Possible complications include:   Damage to the heart or its blood vessels.   A return of blockage.   Bleeding, infection, or bruising at the insertion site.   A collection of blood under the skin (hematoma) at the insertion site.  Blood clot in another part of the body.   Kidney injury.   Allergic reaction to the dye or contrast used.   Bleeding into the abdomen (retroperitoneal bleeding) BEFORE THE PROCEDURE  Do not eat or drink anything for 6 hours before the procedure.   Ask your health care provider about changing or stopping your regular medicines. This is especially important if you are taking diabetes medicines or blood thinners.  Your health care provider will make sure you understand the procedure and the risks and potential complications associated with the  procedure.  PROCEDURE  You may be given a medicine to help you relax before and during the procedure (sedative). This medicine will be given through an IV tube that is put into one of your veins.   The area where the catheter will be inserted is shaved and cleaned. This is usually done in the groin but may be done in the fold of your arm (near your elbow) or in the wrist.   A medicine will be given to numb the area where the catheter will be inserted (local anesthetic).   The catheter is inserted into an artery using a guide wire. A type of X-ray (fluoroscopy) is used to help guide the catheter to the opening of the blocked artery.   A dye is then injected into the catheter, and X-rays are taken. The dye helps to show where any narrowing or blockages are located in the heart arteries.   A tiny wire is guided to the blocked spot, and a balloon is inflated to make the artery wider. The stent is expanded and crushes the plaque into the wall of the vessel. The stent holds the area open like a scaffolding and improves the blood flow.   Sometimes the artery may be made wider using a laser or other tools to remove plaque.   When the blood flow is better, the catheter is removed. The lining of the artery will grow over the stent, which stays where it was placed.  AFTER THE PROCEDURE  If the procedure is done through the leg, you will be kept in  bed lying flat for about 6 hours. You will be instructed to not bend or cross your legs.   The insertion site will be checked frequently.   The pulse in your feet or wrist will be checked frequently.   Additional blood tests, X-rays, and electrocardiography may be done. Document Released: 06/15/2003 Document Revised: 12/14/2013 Document Reviewed: 06/17/2013 [facility] Patient Information 2015 [place], [place]. This information is not intended to replace advice given to you by your health care provider. Make sure you discuss any questions you have  with your health care provider.

## 2014-06-23 NOTE — Progress Notes (Addendum)
Patient ID: James [name], male   DOB: [birth year], 80 y.o.   MRN: [id number]    SUBJECTIVE: FFR was abnormal on LAD stenosis yesterday, patient had DES to LAD.  He was started on Plavix.   Scheduled Meds: . allopurinol  300 mg Oral Daily  . aspirin  81 mg Oral Daily  . clopidogrel  75 mg Oral Q breakfast  . furosemide  40 mg Oral q1800  . furosemide  80 mg Oral QAC breakfast  . irbesartan  150 mg Oral Daily  . pantoprazole  40 mg Oral Daily  . potassium chloride SA  20 mEq Oral Daily  . sodium chloride  3 mL Intravenous Q12H   Continuous Infusions:  PRN Meds:.sodium chloride, acetaminophen, morphine injection, nitroGLYCERIN, ondansetron (ZOFRAN) IV, sodium chloride    Filed Vitals:   06/22/14 2115 06/22/14 2130 06/23/14 0012 06/23/14 0620  BP: 118/44 115/52 124/47 137/48  Pulse: 56 52 61 62  Temp:   98.1 F (36.7 C) 97.9 F (36.6 C)  TempSrc:   Oral Oral  Resp:   16 18  Height:      Weight:   268 lb 15.4 oz (122 kg)   SpO2: 99% 98% 99% 100%    Intake/Output Summary (Last 24 hours) at 06/23/14 0648 Last data filed at 06/22/14 2230  Gross per 24 hour  Intake  937.6 ml  Output    250 ml  Net  687.6 ml    LABS: Basic Metabolic Panel:  Recent Labs  06/21/14 1500 06/23/14 0320  NA 139 138  K 4.2 4.2  CL 100 102  CO2 24 23  GLUCOSE 102* 125*  BUN 14 17  CREATININE 0.90 0.92  CALCIUM 9.5 9.0   Liver Function Tests: No results found for this basename: AST, ALT, ALKPHOS, BILITOT, PROT, ALBUMIN,  in the last 72 hours No results found for this basename: LIPASE, AMYLASE,  in the last 72 hours CBC:  Recent Labs  06/21/14 1500 06/23/14 0320  WBC 7.3 6.4  NEUTROABS 4.4  --   HGB 10.9* 10.3*  HCT 36.0* 33.4*  MCV 89.1 88.6  PLT 210 182   Cardiac Enzymes: No results found for this basename: CKTOTAL, CKMB, CKMBINDEX, TROPONINI,  in the last 72 hours BNP: No components found with this basename: POCBNP,  D-Dimer: No results found for this basename: DDIMER,   in the last 72 hours Hemoglobin A1C: No results found for this basename: HGBA1C,  in the last 72 hours Fasting Lipid Panel: No results found for this basename: CHOL, HDL, LDLCALC, TRIG, CHOLHDL, LDLDIRECT,  in the last 72 hours Thyroid Function Tests: No results found for this basename: TSH, T4TOTAL, FREET3, T3FREE, THYROIDAB,  in the last 72 hours Anemia Panel: No results found for this basename: VITAMINB12, FOLATE, FERRITIN, TIBC, IRON, RETICCTPCT,  in the last 72 hours  RADIOLOGY: Ct Angio Chest Pe W/cm &/or Wo Cm  05/29/2014   CLINICAL DATA:  Shortness of breath and lightheadedness. History of clot in the left leg.  EXAM: CT ANGIOGRAPHY CHEST WITH CONTRAST  TECHNIQUE: Multidetector CT imaging of the chest was performed using the standard protocol during bolus administration of intravenous contrast. Multiplanar CT image reconstructions and MIPs were obtained to evaluate the vascular anatomy.  CONTRAST:  144mL OMNIPAQUE IOHEXOL 350 MG/ML SOLN  COMPARISON:  09/03/2007  FINDINGS: Technically adequate study with moderately good opacification of the central and proximal segmental pulmonary arteries. No focal filling defects are demonstrated. No evidence of significant pulmonary embolus.  Mild  cardiac enlargement. Coronary artery and cardiac valvular calcifications. Calcification of thoracic aorta without aneurysm. Great vessels are tortuous but appear patent. No significant lymphadenopathy in the chest. Esophagus is decompressed. No pleural effusions. Visualized portions of the upper abdominal organs are grossly unremarkable.  Visualization of lung fields is limited due to respiratory motion artifact. There appear to be scattered emphysematous changes and fibrosis in the lungs. Atelectasis in the lung bases is likely dependent. Chronic bronchitic changes are suggested. No focal airspace disease or pneumothorax. But degenerative changes in the thoracic spine. No destructive bone lesions are appreciated.   Review of the MIP images confirms the above findings.  IMPRESSION: No evidence of significant pulmonary embolus.   Electronically Signed   By: Lucienne Capers M.D.   On: 05/29/2014 22:54   Dg Chest Port 1 View  05/29/2014   CLINICAL DATA:  Shortness of Breath  EXAM: PORTABLE CHEST - 1 VIEW  COMPARISON:  December 31, 2011  FINDINGS: There is underlying emphysematous change. There is no edema or consolidation. Heart is mildly enlarged with normal pulmonary vascularity. No pneumothorax. There is atherosclerotic change in the aorta. No adenopathy.  IMPRESSION: Underlying emphysema. Mild cardiac enlargement. No edema or consolidation.   Electronically Signed   By: Lowella Grip M.D.   On: 05/29/2014 15:04    PHYSICAL EXAM General: NAD Neck: No JVD, no thyromegaly or thyroid nodule.  Lungs: Clear to auscultation bilaterally with normal respiratory effort. CV: Nondisplaced PMI.  Heart regular S1/S2, no S3/S4, no murmur.  No peripheral edema.  No carotid bruit.  Normal pedal pulses.  Abdomen: Soft, nontender, no hepatosplenomegaly, no distention.  Neurologic: Alert and oriented x 3.  Psych: Normal affect. Extremities: No clubbing or cyanosis.   TELEMETRY: Reviewed telemetry pt in NSR  ASSESSMENT AND PLAN: 80 yo with history of dyspnea, chronic diastolic CHF, and prior DVT had RHC/LHC due to chronic dyspnea.   1. Chronic diastolic CHF: Normal right and left heart filling pressures on RHC.  Should be ok to cut back to 80 mg Lasix once daily.  2. CAD: 80% proximal LAD stenosis (hemodynamically significant by FFR) treated with DES to LAD.  Patient started on Plavix.  He has a superficial lower leg thrombus and there had been discussion about starting anticoagulation for 6 wks.  For now, home on ASA + Plavix.  I will talk with Dr. Beryle Beams about placing him on Eliquis 5 mg bid x [redacted] wks along with Plavix.  I will call him back about starting Eliquis once I've talked with Dr. Beryle Beams.  3. May go home  today.  Home meds: ASA 81, Plavix 75, Lasix 80 mg once daily, telmisartan 40 daily, Livalo 4 daily.  Will need cardiac rehab (please arrange).  I will call him regarding the Eliquis.   Loralie Champagne 06/23/2014 6:54 AM

## 2014-06-23 NOTE — Telephone Encounter (Signed)
PHARMACIST NEEDED A VERBAL ORDER  FOR PLAVIX.  THE PRESCRIPTION WAS NOT SIGNED-  Kerin Ransom PA.  VERBAL ORDER GIVEN

## 2014-06-23 NOTE — Care Management Note (Addendum)
  Page 1 of 1   06/23/2014     11:00:36 AM CARE MANAGEMENT NOTE 06/23/2014  Patient:  James [name], James [name]   Account Number:  [id number]  Date Initiated:  06/23/2014  Documentation initiated by:  Mariann Laster  Subjective/Objective Assessment:   HR     Action/Plan:   CM to follow for dispositon needs   Anticipated DC Date:  06/23/2014   Anticipated DC Plan:  HOME/SELF CARE         Choice offered to / List presented to:             Status of service:  Completed, signed off Medicare Important Message given?   (If response is "NO", the following Medicare IM given date fields will be blank) Date Medicare IM given:   Medicare IM given by:   Date Additional Medicare IM given:   Additional Medicare IM given by:    Discharge Disposition:  HOME/SELF CARE  Per UR Regulation:    If discussed at Long Length of Stay Meetings, dates discussed:    Comments:  Ajamu Maxon RN, BSN, MSHL, CCM  Nurse - Case Manager, (Unit [phone]  06/23/2014 Med Review:  Plavix

## 2014-06-23 NOTE — Discharge Summary (Signed)
Patient ID: James [name],  MRN: [id number], DOB/AGE: 07/07/1928 80 y.o.  Admit date: 06/22/2014 Discharge date: 06/23/2014  Primary Care Provider: [name], MD   Primary Cardiologist: Dr Aundra Dubin  Discharge Diagnoses Principal Problem:   SOB (shortness of breath) Active Problems:   CAD S/P LAD DES after FFR 06/22/14   DVT (deep venous thrombosis)- off anticoagulation now   Chronic diastolic CHF (congestive heart failure)   Anemia- Dr Beryle Beams follows   OBESITY, MORBID   HYPERTENSION, BENIGN   HLD (hyperlipidemia)   Abnormality of gait    Procedures: Cath LAD PCI/DES 06/22/14 after FFR suggested a hemodynamically significant lesion   Hospital Course: The pt is a 80 y.o. male with a past medical history of deep venous thrombosis diagnosed November of 2014 treated with 5-6 months of anticoagulation with Xarelto, stopped 4-6 weeks ago, history of diastolic congestive heart failure with last transthoracic echocardiogram performed on 12/08/2013 that showed an ejection fraction of 60-65% with grade 1 diastolic dysfunction. He was admitted 6/7 to 6/15 with dyspnea. He was noted to be anemic and was transfused. He was referred to pulmonary for dyspnea and had an appointment for 07/25/14 with Dr Joya Gaskins. He saw Dr Aundra Dubin 06/21/14 and was noted to be quite dyspneic. He reports having shortness of breath shortly after the discontinuation of Xarelto, progressively worsening over the past month. He also reports poor tolerance to physical exertion, becoming dyspneic ambulating around his house. He was admitted 06/22/14 and underwent Rt and Lt heart cath. This revealed normal Rt heart pressures and 60-70% LAD stenosis. FFR was 0.7. He underwent placement of a DES to his LAD. Dr Beryle Beams saw the pt before discharge and felt he does not need to resume Xarelto for chronic superficial DVT. He does need further evaluation of his anemia. He is discharged 06/23/14 and will follow up with Dr Aundra Dubin as an OP.     Discharge Vitals:  Blood pressure 143/53, pulse 70, temperature 97.8 F (36.6 C), temperature source Oral, resp. rate 18, height 5' 11.5" (1.816 m), weight 268 lb 15.4 oz (122 kg), SpO2 95.00%.    Labs: Results for orders placed during the hospital encounter of 06/22/14 (from the past 48 hour(s))  POCT I-STAT 3, VENOUS BLOOD GAS (G3P V)     Status: Abnormal   Collection Time    06/22/14  3:28 PM      Result Value Ref Range   pH, Ven 7.392 (*) 7.250 - 7.300   pCO2, Ven 36.3 (*) 45.0 - 50.0 mmHg   pO2, Ven 34.0  30.0 - 45.0 mmHg   Bicarbonate 22.1  20.0 - 24.0 mEq/L   TCO2 23  0 - 100 mmol/L   O2 Saturation 65.0     Acid-base deficit 2.0  0.0 - 2.0 mmol/L   Sample type VENOUS     Comment NOTIFIED PHYSICIAN    POCT I-STAT 3, ART BLOOD GAS (G3+)     Status: Abnormal   Collection Time    06/22/14  3:58 PM      Result Value Ref Range   pH, Arterial 7.455 (*) 7.350 - 7.450   pCO2 arterial 33.5 (*) 35.0 - 45.0 mmHg   pO2, Arterial 78.0 (*) 80.0 - 100.0 mmHg   Bicarbonate 23.6  20.0 - 24.0 mEq/L   TCO2 25  0 - 100 mmol/L   O2 Saturation 96.0     Sample type ARTERIAL    POCT ACTIVATED CLOTTING TIME     Status: None  Collection Time    06/22/14  4:16 PM      Result Value Ref Range   Activated Clotting Time 157    POCT ACTIVATED CLOTTING TIME     Status: None   Collection Time    06/22/14  4:31 PM      Result Value Ref Range   Activated Clotting Time 197    POCT ACTIVATED CLOTTING TIME     Status: None   Collection Time    06/22/14  4:44 PM      Result Value Ref Range   Activated Clotting Time 371    BASIC METABOLIC PANEL     Status: Abnormal   Collection Time    06/23/14  3:20 AM      Result Value Ref Range   Sodium 138  137 - 147 mEq/L   Potassium 4.2  3.7 - 5.3 mEq/L   Chloride 102  96 - 112 mEq/L   CO2 23  19 - 32 mEq/L   Glucose, Bld 125 (*) 70 - 99 mg/dL   BUN 17  6 - 23 mg/dL   Creatinine, Ser 0.92  0.50 - 1.35 mg/dL   Calcium 9.0  8.4 - 10.5 mg/dL   GFR  calc non Af Amer 76 (*) >90 mL/min   GFR calc Af Amer 88 (*) >90 mL/min   Comment: (NOTE)     The eGFR has been calculated using the CKD EPI equation.     This calculation has not been validated in all clinical situations.     eGFR's persistently <90 mL/min signify possible Chronic Kidney     Disease.   Anion gap 13  5 - 15  CBC     Status: Abnormal   Collection Time    06/23/14  3:20 AM      Result Value Ref Range   WBC 6.4  4.0 - 10.5 K/uL   RBC 3.77 (*) 4.22 - 5.81 MIL/uL   Hemoglobin 10.3 (*) 13.0 - 17.0 g/dL   HCT 33.4 (*) 39.0 - 52.0 %   MCV 88.6  78.0 - 100.0 fL   MCH 27.3  26.0 - 34.0 pg   MCHC 30.8  30.0 - 36.0 g/dL   RDW 22.3 (*) 11.5 - 15.5 %   Platelets 182  150 - 400 K/uL    Disposition:      Follow-up Information   Follow up with Loralie Champagne, MD On 07/07/2014. (1:40)    Specialty:  Cardiology   Contact information:   [address]  [place] [address] [phone]       Discharge Medications:    Medication List    STOP taking these medications       rivaroxaban 20 MG Tabs tablet  Commonly known as:  XARELTO      TAKE these medications       acetaminophen 325 MG tablet  Commonly known as:  TYLENOL  Take 2 tablets (650 mg total) by mouth every 4 (four) hours as needed for headache or mild pain.     allopurinol 300 MG tablet  Commonly known as:  ZYLOPRIM  Take 300 mg by mouth daily.     aspirin 81 MG chewable tablet  Chew 1 tablet (81 mg total) by mouth daily.     clopidogrel 75 MG tablet  Commonly known as:  PLAVIX  Take 1 tablet (75 mg total) by mouth daily with breakfast.     furosemide 40 MG tablet  Commonly known  as:  LASIX  Take 1 tablet (40 mg total) by mouth daily. 53m in the morning and 461min the evening     Iron Polysacch Cmplx-B12-FA 150-0.025-1 MG Caps  Take 1 capsule by mouth 2 (two) times daily.     LIVALO 4 MG Tabs  Generic drug:  Pitavastatin Calcium  Take 4 mg by mouth daily.     METAMUCIL PO   Take 20 mLs by mouth every evening.     multivitamin tablet  Take 1 tablet by mouth daily.     niacin 1000 MG CR tablet  Commonly known as:  NIASPAN  Take 1,000 mg by mouth at bedtime.     nitroGLYCERIN 0.4 MG SL tablet  Commonly known as:  NITROSTAT  Place 1 tablet (0.4 mg total) under the tongue every 5 (five) minutes as needed. For chest pain     OSTEO BI-FLEX JOINT SHIELD PO  Take 1 tablet by mouth daily.     pantoprazole 40 MG tablet  Commonly known as:  PROTONIX  Take 1 tablet (40 mg total) by mouth daily.     potassium chloride SA 20 MEQ tablet  Commonly known as:  KLOR-CON M20  Take 1 tablet (20 mEq total) by mouth daily.     PROBIOTIC & ACIDOPHILUS EX ST PO  Take 1 tablet by mouth daily.     telmisartan 40 MG tablet  Commonly known as:  MICARDIS  Take 40 mg by mouth every morning.     Vitamin D3 2000 UNITS capsule  Take 2,000 Units by mouth daily.         Duration of Discharge Encounter: Greater than 30 minutes including physician time.  SiAngelena FormA-C 06/23/2014 8:24 AM

## 2014-06-23 NOTE — Progress Notes (Signed)
TR BAND REMOVAL  LOCATION:    right radial  DEFLATED PER PROTOCOL:    Yes.    TIME BAND OFF / DRESSING APPLIED:    22:00   SITE UPON ARRIVAL:    Level 0  SITE AFTER BAND REMOVAL:    Level 0  REVERSE ALLEN'S TEST:     positive  CIRCULATION SENSATION AND MOVEMENT:    Within Normal Limits   Yes.    COMMENTS:   Pt tolerated removal of TR band without complications.  Will continue to monitor patient.

## 2014-06-23 NOTE — Progress Notes (Signed)
CARDIAC REHAB PHASE I   PRE:  Rate/Rhythm: 77SR  BP:  Supine:   Sitting: 143/53  Standing:    SaO2:   MODE:  Ambulation: 500 ft   POST:  Rate/Rhythm: 77 SR  BP:  Supine:   Sitting: 145/54  Standing:    SaO2:  0740-0835 Pt walked 500 ft on RA with steady gait. Slightly SOB but improved per pt. No CP. Tolerated well. Education completed with pt who voiced understanding. Discussed CRP 2 and pt gave permission to refer to [facility]. Discussed importance of NTG and plavix use.   Graylon Good, RN BSN  06/23/2014 8:31 AM

## 2014-06-28 ENCOUNTER — Ambulatory Visit (INDEPENDENT_AMBULATORY_CARE_PROVIDER_SITE_OTHER): Payer: Medicare Other | Admitting: Pharmacist

## 2014-06-28 DIAGNOSIS — Z9861 Coronary angioplasty status: Secondary | ICD-10-CM

## 2014-06-28 DIAGNOSIS — D649 Anemia, unspecified: Secondary | ICD-10-CM

## 2014-06-28 DIAGNOSIS — I2510 Atherosclerotic heart disease of native coronary artery without angina pectoris: Secondary | ICD-10-CM

## 2014-06-28 DIAGNOSIS — Z79899 Other long term (current) drug therapy: Secondary | ICD-10-CM

## 2014-06-28 LAB — MULTIPLE MYELOMA PANEL, SERUM
ALBUMIN ELP: 54.5 % — AB (ref 55.8–66.1)
Alpha-1-Globulin: 5.8 % — ABNORMAL HIGH (ref 2.9–4.9)
Alpha-2-Globulin: 16.1 % — ABNORMAL HIGH (ref 7.1–11.8)
BETA 2: 5.4 % (ref 3.2–6.5)
BETA GLOBULIN: 6.1 % (ref 4.7–7.2)
Gamma Globulin: 12.1 % (ref 11.1–18.8)
IGM, SERUM: 53 mg/dL — AB (ref 41–251)
IgA: 274 mg/dL (ref 68–379)
IgG (Immunoglobin G), Serum: 848 mg/dL (ref 650–1600)
M-Spike, %: NOT DETECTED g/dL
Total Protein: 6.7 g/dL (ref 6.0–8.3)

## 2014-06-28 NOTE — Patient Instructions (Addendum)
Stop Niacin.   Talk with your primary care doctor about stopping allopurinol.  Your uric acid levels may decrease once you stop Niacin and you may not need this medication anymore.  Continue to follow your low sodium diet.   Carry your stool card samples to the lab with you next week.

## 2014-06-30 ENCOUNTER — Other Ambulatory Visit: Payer: Medicare Other

## 2014-07-04 ENCOUNTER — Other Ambulatory Visit (INDEPENDENT_AMBULATORY_CARE_PROVIDER_SITE_OTHER): Payer: Medicare Other

## 2014-07-04 DIAGNOSIS — D649 Anemia, unspecified: Secondary | ICD-10-CM

## 2014-07-04 LAB — CBC WITH DIFFERENTIAL/PLATELET
Basophils Absolute: 0 10*3/uL (ref 0.0–0.1)
Basophils Relative: 0.2 % (ref 0.0–3.0)
EOS PCT: 2.3 % (ref 0.0–5.0)
Eosinophils Absolute: 0.2 10*3/uL (ref 0.0–0.7)
HEMATOCRIT: 32.9 % — AB (ref 39.0–52.0)
HEMOGLOBIN: 10.5 g/dL — AB (ref 13.0–17.0)
Lymphocytes Relative: 15 % (ref 12.0–46.0)
Lymphs Abs: 1 10*3/uL (ref 0.7–4.0)
MCHC: 32 g/dL (ref 30.0–36.0)
MCV: 87.4 fl (ref 78.0–100.0)
Monocytes Absolute: 0.6 10*3/uL (ref 0.1–1.0)
Monocytes Relative: 8.8 % (ref 3.0–12.0)
NEUTROS ABS: 4.8 10*3/uL (ref 1.4–7.7)
Neutrophils Relative %: 73.7 % (ref 43.0–77.0)
PLATELETS: 207 10*3/uL (ref 150.0–400.0)
RBC: 3.77 Mil/uL — ABNORMAL LOW (ref 4.22–5.81)
RDW: 26.7 % — ABNORMAL HIGH (ref 11.5–15.5)
WBC: 6.5 10*3/uL (ref 4.0–10.5)

## 2014-07-04 LAB — IBC PANEL
Iron: 37 ug/dL — ABNORMAL LOW (ref 42–165)
SATURATION RATIOS: 12.5 % — AB (ref 20.0–50.0)
TRANSFERRIN: 212.1 mg/dL (ref 212.0–360.0)

## 2014-07-05 ENCOUNTER — Other Ambulatory Visit: Payer: Self-pay | Admitting: Family Medicine

## 2014-07-05 DIAGNOSIS — D509 Iron deficiency anemia, unspecified: Secondary | ICD-10-CM

## 2014-07-05 NOTE — Progress Notes (Signed)
HPI  [name] is a 80 yo M pt of Dr. Claris Gladden with a history of nonobstructive CAD and chronic diastolic CHF.  He was seen by Dr. Aundra Dubin on 6/25 and referred to pharmacy for nutrition counseling for low-sodium diet.  Since that time, he was admitted to the hospital from 7/1-7/2 for SOB.  He had a right and left heart cath that showed normal right pressures and 60-70% blockage of the LAD, which was treated with DES.  He was also found to be anemic and transfused.   Since his visit with Dr. Aundra Dubin in June, pt has been doing more to control his diet.  His daughter has started helping him with shopping and making better decisions.  She is cooking many of his meals and he is limiting how often he goes out to eat. Some examples of his meals include:   Breakfast: oatmeal, raisins, blueberries, peaches and yogurt Lunch: tomato sandwich on wheat bread, light mayo, corn Dinner: chicken breasts  Drinks: orange juice, water, lemonade   Pt also asked for complete review of his medication list to see if anything else could be removed or needed to be changed.   Current Outpatient Prescriptions  Medication Sig Dispense Refill  . acetaminophen (TYLENOL) 325 MG tablet Take 2 tablets (650 mg total) by mouth every 4 (four) hours as needed for headache or mild pain.      Marland Kitchen allopurinol (ZYLOPRIM) 300 MG tablet Take 300 mg by mouth daily.      Marland Kitchen aspirin 81 MG chewable tablet Chew 1 tablet (81 mg total) by mouth daily.      . Cholecalciferol (VITAMIN D3) 2000 UNITS capsule Take 2,000 Units by mouth daily.        . clopidogrel (PLAVIX) 75 MG tablet Take 1 tablet (75 mg total) by mouth daily with breakfast.  30 tablet  11  . furosemide (LASIX) 40 MG tablet Take 80 mg by mouth daily.      . Iron Polysacch Cmplx-B12-FA 150-0.025-1 MG CAPS Take 1 capsule by mouth 2 (two) times daily.  180 each  3  . Misc Natural Products (OSTEO BI-FLEX JOINT SHIELD PO) Take 1 tablet by mouth daily.       . Multiple Vitamin (MULTIVITAMIN)  tablet Take 1 tablet by mouth daily.        . nitroGLYCERIN (NITROSTAT) 0.4 MG SL tablet Place 1 tablet (0.4 mg total) under the tongue every 5 (five) minutes as needed. For chest pain  25 tablet  5  . pantoprazole (PROTONIX) 40 MG tablet Take 1 tablet (40 mg total) by mouth daily.  90 tablet  3  . Pitavastatin Calcium (LIVALO) 4 MG TABS Take 4 mg by mouth daily.      . potassium chloride SA (KLOR-CON M20) 20 MEQ tablet Take 1 tablet (20 mEq total) by mouth daily.  90 tablet  3  . Probiotic Product (PROBIOTIC & ACIDOPHILUS EX ST PO) Take 1 tablet by mouth daily.      . Psyllium (METAMUCIL PO) Take 20 mLs by mouth every evening.      Marland Kitchen telmisartan (MICARDIS) 40 MG tablet Take 40 mg by mouth every morning.        No current facility-administered medications for this visit.   Allergies  Allergen Reactions  . Neomycin-Bacitracin Zn-Polymyx     Other reaction(s): RASH  . Penicillins     Other reaction(s): OTHER REACTION: Rash, swelling  . Rofecoxib     Other reaction(s): NOT KNOWN  .  Sulfa Antibiotics     Other reaction(s): OTHER  . Sulfamethoxazole-Trimethoprim     REACTION: questional onset of low platelets and bleeding  . Tramadol Hcl     REACTION: dizziness, drowsiness  . Neosporin [Neomycin-Polymyxin-Gramicidin] Rash   Assessment and Plan  1.  Diastolic CHF- Reviewed low sodium diet with pt.  He states his daughter helps him buy groceries and fix his meals and has already started to watch the sodium content since his visit in June.  Reviewed low-sodium food handout with pt and some of the more common foods to avoid that have hidden sodium.    2.  Hyperlipidemia- Pt currently on Livalo and Niacin.  Given his history of gout and hyperglycemia, will discontinue Niacin as niacin may worsen both of these conditions.  His last lipid panel was at goal and Niacin not likely a huge contributor to this.   3.  Anemia- Pt reports black, sticky stools since discharge from hospital.  He has a  history of hemmocult + stools but unsure if this is related to iron supplementation or bleeding.  Pt given stool sample card today to carry to the lab with him next week at his PCP office.   4.  Gout- Pt is interested in stopping allopurinol.  Explained this would need to be discussed with his PCP.  By stopping niacin, his uric acid levels may improve and he may be able to stop this in the future.

## 2014-07-06 NOTE — Telephone Encounter (Signed)
Encounter complete. 

## 2014-07-07 ENCOUNTER — Telehealth: Payer: Self-pay | Admitting: *Deleted

## 2014-07-07 ENCOUNTER — Encounter (HOSPITAL_COMMUNITY): Payer: Self-pay

## 2014-07-07 ENCOUNTER — Ambulatory Visit (HOSPITAL_COMMUNITY)
Admit: 2014-07-07 | Discharge: 2014-07-07 | Disposition: A | Discharge status: Home / Self Care | Payer: Medicare Other | Source: Ambulatory Visit | Attending: Cardiology | Admitting: Cardiology

## 2014-07-07 ENCOUNTER — Other Ambulatory Visit: Payer: Medicare Other

## 2014-07-07 VITALS — BP 120/58 | HR 72 | Wt 272.1 lb

## 2014-07-07 DIAGNOSIS — I509 Heart failure, unspecified: Secondary | ICD-10-CM

## 2014-07-07 DIAGNOSIS — Z87891 Personal history of nicotine dependence: Secondary | ICD-10-CM | POA: Insufficient documentation

## 2014-07-07 DIAGNOSIS — R0602 Shortness of breath: Secondary | ICD-10-CM

## 2014-07-07 DIAGNOSIS — I10 Essential (primary) hypertension: Secondary | ICD-10-CM

## 2014-07-07 DIAGNOSIS — Z7982 Long term (current) use of aspirin: Secondary | ICD-10-CM | POA: Diagnosis not present

## 2014-07-07 DIAGNOSIS — I2510 Atherosclerotic heart disease of native coronary artery without angina pectoris: Secondary | ICD-10-CM | POA: Diagnosis not present

## 2014-07-07 DIAGNOSIS — Z8249 Family history of ischemic heart disease and other diseases of the circulatory system: Secondary | ICD-10-CM | POA: Insufficient documentation

## 2014-07-07 DIAGNOSIS — Z9861 Coronary angioplasty status: Secondary | ICD-10-CM

## 2014-07-07 DIAGNOSIS — R0609 Other forms of dyspnea: Secondary | ICD-10-CM | POA: Diagnosis not present

## 2014-07-07 DIAGNOSIS — I5032 Chronic diastolic (congestive) heart failure: Secondary | ICD-10-CM | POA: Diagnosis not present

## 2014-07-07 DIAGNOSIS — I82409 Acute embolism and thrombosis of unspecified deep veins of unspecified lower extremity: Secondary | ICD-10-CM

## 2014-07-07 DIAGNOSIS — D62 Acute posthemorrhagic anemia: Secondary | ICD-10-CM

## 2014-07-07 DIAGNOSIS — Z09 Encounter for follow-up examination after completed treatment for conditions other than malignant neoplasm: Secondary | ICD-10-CM | POA: Insufficient documentation

## 2014-07-07 DIAGNOSIS — E785 Hyperlipidemia, unspecified: Secondary | ICD-10-CM | POA: Diagnosis not present

## 2014-07-07 DIAGNOSIS — Z7902 Long term (current) use of antithrombotics/antiplatelets: Secondary | ICD-10-CM | POA: Insufficient documentation

## 2014-07-07 DIAGNOSIS — R0989 Other specified symptoms and signs involving the circulatory and respiratory systems: Secondary | ICD-10-CM | POA: Insufficient documentation

## 2014-07-07 LAB — HEMOCCULT SLIDES (X 3 CARDS)
Fecal Occult Blood: NEGATIVE
OCCULT 1: POSITIVE — AB
OCCULT 2: POSITIVE — AB
OCCULT 3: NEGATIVE
OCCULT 4: NEGATIVE
OCCULT 5: NEGATIVE

## 2014-07-07 MED ORDER — NITROGLYCERIN 0.4 MG SL SUBL: 0.4000 mg | SUBLINGUAL_TABLET | SUBLINGUAL | Status: DC | PRN

## 2014-07-07 MED ORDER — CLOPIDOGREL BISULFATE 75 MG PO TABS: 75.0000 mg | ORAL_TABLET | Freq: Every day | ORAL | Status: DC

## 2014-07-07 NOTE — Telephone Encounter (Signed)
Encounter complete. 

## 2014-07-07 NOTE — Telephone Encounter (Signed)
Per Dr Aundra Dubin this pt needs a order placed for hemoccult stool card times 3.  Lab notified about this.

## 2014-07-07 NOTE — Patient Instructions (Signed)
Your physician has recommended that you have a pulmonary function test. Pulmonary Function Tests are a group of tests that measure how well air moves in and out of your lungs.  We will contact you in 3 months to schedule your next appointment with Dr Aundra Dubin

## 2014-07-09 ENCOUNTER — Other Ambulatory Visit: Payer: Self-pay | Admitting: Cardiology

## 2014-07-10 ENCOUNTER — Other Ambulatory Visit: Payer: Self-pay | Admitting: Cardiology

## 2014-07-11 NOTE — Progress Notes (Signed)
Patient ID: James [name], male   DOB: 06/05/1928, 80 y.o.   MRN: [id number] PCP: Dr. Damita Dunnings Pulmonologist: Dr. Joya Gaskins  80 yo with history of nonobstructive CAD and chronic diastolic CHF presents for cardiology followup.   In 11/14, he had a left leg DVT.  Only trigger seems to have been that he sat for 2 days in a row in a theater taking a test for the [facility] prior to the DVT.  He was on Xarelto for 6 months.     Patient has been on Lasix 80 mg daily for several years now.  He has chronic exertional dyspnea.  However, over the last few months, this has considerably worsened.  He had an echo in 12/14 with EF 60-65% and aortic sclerosis without significant stenosis.  He was admitted in 6/15 with dyspnea. CTA chest was negative for PE.  Lower extremity dopplers showed a superficial thrombus but no DVT. He was found to be anemic with hemoglobin 7.9.  He got 1 unit PRBCs and IV iron.  He also was diuresed with IV Lasix.  He says that he really did not feel much better.  Repeat hemoglobins have been improved. He has not had overt GI bleeding.  At last appointment, he was short of breath walking short distances such as around his house (bedroom to kitchen).  No orthopnea or PND.  No chest pain or tightness.  No palpitations.  Lexiscan Cardiolite was done in 6/15, showing basal to mid inferior mild ischemia (overall low risk study).   Given ongoing significant exertional symptoms, I took him for LHC/RHC.  RHC showed normal filling pressures and cardiac output.  LHC showed a borderline LAD stenosis.  FFR was done, suggesting that the stenosis was hemodynamically significant.  Patient had DES to LAD.  He saw Dr. Beryle Beams shortly thereafter, and it was decided that he was low risk for future DVT so Xarelto was not started.   Since the PCI, patient has really noted no change to his chronic exertional dyspnea.   Labs (10/14): LDL 54, HDL 59 Labs (12/14): K 3.8, creatinine 0.9, HCT 40.3 Labs (6/15): K 3.7,  creatinine 0.9, hemoglobin 7.9 => 9.4, BNP 83 Labs (7/15): K 4.2, creatinine 0.92, HCT 32.9  PMH: 1. Carotid stenosis: Carotid dopplers (12/13) with 0-39% stenosis.  2. Hyperlipidemia 3. Gout 4. ITP: 5/10.  Thought to be due to Septra.  5. Obesity 6. OA 7. BPH 8. Nephrolithiasis 9. OSA 10. GERD 11. H/o melanoma 12. CAD: LHC (6/05) with EF 60%, 50% mLCx stenosis, 40% pLAD stenosis.  No intervention.  Lexiscan Cardiolite (6/15) with EF 74%, basal to mid inferior mild ischemia.   LHC (7/15) with 70% proximal LAD stenosis after D1, significant by FFR.  Patient had DES to proximal LAD.  13. DVT (11/14): Spontaneous.  APLAS serologies negative.  6/15 [place] for DVT showed no DVT but did show a superficial thrombus right lesser saphenous vein. LE doppler (06/17/14): No evidence of lower extremity DVT or incompetence, bilaterally. Acute, partially occlusive superficial thrombus in the right lesser saphenous vein, similar to that described in the report from 05/30/14. Resolved left calf DVT. Chronic non-occlusive thrombus in the left lesser saphenous vein 14. Diastolic CHF: Echo (40/98) with EF 60-65%, aortic sclerosis without significant stenosis.  RHC (7/15) with mean RA 2, PA 22/8, mean PCWP 7, CI 2.9.  15. Symptomatic anemia 6/15, ?GI bleeding but nothing overt.   SH: Married, lives in [place], retired, 2 kids, quit smoking in 1968.  FH: h/o CVA and CAD  ROS: All systems reviewed and negative except as per HPI.   Current Outpatient Prescriptions  Medication Sig Dispense Refill  . allopurinol (ZYLOPRIM) 300 MG tablet Take 300 mg by mouth daily.      Marland Kitchen aspirin 81 MG chewable tablet Chew 1 tablet (81 mg total) by mouth daily.      . Cholecalciferol (VITAMIN D3) 2000 UNITS capsule Take 2,000 Units by mouth daily.        . clopidogrel (PLAVIX) 75 MG tablet Take 1 tablet (75 mg total) by mouth daily with breakfast.  90 tablet  3  . furosemide (LASIX) 40 MG tablet Take 80 mg by mouth daily.       . Iron Polysacch Cmplx-B12-FA 150-0.025-1 MG CAPS Take 1 capsule by mouth 2 (two) times daily.  180 each  3  . Misc Natural Products (OSTEO BI-FLEX JOINT SHIELD PO) Take 1 tablet by mouth daily.       . Multiple Vitamin (MULTIVITAMIN) tablet Take 1 tablet by mouth daily.        . nitroGLYCERIN (NITROSTAT) 0.4 MG SL tablet Place 1 tablet (0.4 mg total) under the tongue every 5 (five) minutes as needed. For chest pain  25 tablet  5  . pantoprazole (PROTONIX) 40 MG tablet Take 1 tablet (40 mg total) by mouth daily.  90 tablet  3  . Pitavastatin Calcium (LIVALO) 4 MG TABS Take 4 mg by mouth daily.      . potassium chloride SA (KLOR-CON M20) 20 MEQ tablet Take 1 tablet (20 mEq total) by mouth daily.  90 tablet  3  . Probiotic Product (PROBIOTIC & ACIDOPHILUS EX ST PO) Take 1 tablet by mouth daily.      . Psyllium (METAMUCIL PO) Take 20 mLs by mouth every evening.      Marland Kitchen telmisartan (MICARDIS) 40 MG tablet Take 40 mg by mouth every morning.        No current facility-administered medications for this encounter.    BP 120/58  Pulse 72  Wt 272 lb 1.9 oz (123.433 kg)  SpO2 98% General: NAD, obese Neck: Thick, JVP difficult but may be mildly elevated 8 cm, no thyromegaly or thyroid nodule.  Lungs: Clear to auscultation bilaterally with normal respiratory effort. CV: Nondisplaced PMI.  Heart regular S1/S2, no S3/S4, no murmur. 1+ edema 1/2 to knee bilaterally.  No carotid bruit.  Normal pedal pulses.  Abdomen: Soft, nontender, no hepatosplenomegaly, no distention.  Skin: Intact without lesions or rashes.  Neurologic: Alert and oriented x 3.  Psych: Normal affect. Extremities: No clubbing or cyanosis.   Assessment/Plan: 1. Dyspnea: This has considerably worsened over the last few months.  He was anemic in 6/15, but transfusion did not help much and last hemoglobin was improved but he is still dyspneic. No PE on CTA chest 6/15.  He was thought to be volume overloaded at at prior appointment.  Lasix  was increased but did not seem to help his symptoms much. He quit smoking years ago. LHC/RHC showed normal right and left heart filling pressures and a 70-80% proximal LAD stenosis that was hemodynamically significant by FFR.  Patient had DES to proximal LAD.  This did not help his dyspnea.  - I let him cut back Lasix to 80 mg once daily given normal filling pressures on RHC and no improvement with increasing Lasix.  - I am going to get PFTs.  I think he also ought to have a sleep study to  look for OSA but he seems reluctant to do this.  He has an appointment coming up with pulmonology (Dr Joya Gaskins). Depending on pulmonary evaluation, I may set him up for CPX.  - Needs to lose weight, suspect this plays a role in dyspnea.  2. Hyperlipidemia: Good lipids in 10/14.  Continue Livalo.  3. DVT: Occurred in 11/14.  Possibly triggered by long hours sitting in a theater seat taking a test.  Had 6 months of Xarelto, now back on ASA and off Xarelto.  Serologies for APLAS were negative.  CTA chest in 6/15 was negative for PE but he had a superficial thrombus on recent lower extremity dopplers.  Repeat dopplers also showed the presence of superficial thrombus (no DVT extension).  There was consideration for treating him with Xarelto x 6 wks, but given the use now of ASA + Plavix (for stent), Dr Beryle Beams did not recommend putting him on Xarelto as he is probably not high risk for DVT.   4. Chronic diastolic CHF: NYHA class III symptoms, unchanged with increased Lasix despite lower weight.  Normal filling pressures on recent RHC.  5. HTN: BP is controlled currently.  6. CAD: s/p DES to LAD in 7/15.  This did not help exertional dyspnea.  I will have him enroll in cardiac rehab.   Loralie Champagne 07/11/2014

## 2014-07-14 ENCOUNTER — Telehealth (HOSPITAL_COMMUNITY): Payer: Self-pay | Admitting: *Deleted

## 2014-07-14 DIAGNOSIS — K921 Melena: Secondary | ICD-10-CM

## 2014-07-14 NOTE — Telephone Encounter (Signed)
Message copied by Scarlette Calico on Thu Jul 14, 2014  4:16 PM ------      Message from: Larey Dresser      Created: Thu Jul 07, 2014  4:24 PM       2/6 heme positive.  Should refer for GI evaluation, will eventually needs scopes. ------

## 2014-07-15 ENCOUNTER — Encounter: Payer: Self-pay | Admitting: Gastroenterology

## 2014-07-18 ENCOUNTER — Other Ambulatory Visit: Payer: Self-pay | Admitting: Cardiology

## 2014-07-18 ENCOUNTER — Ambulatory Visit (HOSPITAL_COMMUNITY)
Admission: RE | Admit: 2014-07-18 | Discharge: 2014-07-18 | Disposition: A | Discharge status: Home / Self Care | Payer: Medicare Other | Source: Ambulatory Visit | Attending: Cardiology | Admitting: Cardiology

## 2014-07-18 DIAGNOSIS — J988 Other specified respiratory disorders: Secondary | ICD-10-CM | POA: Insufficient documentation

## 2014-07-18 DIAGNOSIS — R0602 Shortness of breath: Secondary | ICD-10-CM | POA: Diagnosis not present

## 2014-07-18 LAB — PULMONARY FUNCTION TEST
DL/VA % pred: 75 %
DL/VA: 3.5 ml/min/mmHg/L
DLCO COR % PRED: 56 %
DLCO COR: 19.35 ml/min/mmHg
DLCO UNC % PRED: 56 %
DLCO unc: 19.35 ml/min/mmHg
FEF 25-75 PRE: 1.6 L/s
FEF 25-75 Post: 2.06 L/sec
FEF2575-%Change-Post: 28 %
FEF2575-%PRED-PRE: 84 %
FEF2575-%Pred-Post: 107 %
FEV1-%Change-Post: 3 %
FEV1-%PRED-PRE: 81 %
FEV1-%Pred-Post: 84 %
FEV1-Post: 2.44 L
FEV1-Pre: 2.35 L
FEV1FVC-%Change-Post: -2 %
FEV1FVC-%Pred-Pre: 102 %
FEV6-%CHANGE-POST: 6 %
FEV6-%PRED-POST: 87 %
FEV6-%PRED-PRE: 82 %
FEV6-Post: 3.35 L
FEV6-Pre: 3.16 L
FEV6FVC-%CHANGE-POST: 0 %
FEV6FVC-%PRED-POST: 104 %
FEV6FVC-%Pred-Pre: 105 %
FVC-%CHANGE-POST: 6 %
FVC-%PRED-POST: 83 %
FVC-%Pred-Pre: 78 %
FVC-POST: 3.43 L
FVC-PRE: 3.21 L
Post FEV1/FVC ratio: 71 %
Post FEV6/FVC ratio: 98 %
Pre FEV1/FVC ratio: 73 %
Pre FEV6/FVC Ratio: 98 %
RV % pred: 50 %
RV: 1.43 L
TLC % PRED: 65 %
TLC: 4.81 L

## 2014-07-18 MED ORDER — ALBUTEROL SULFATE (2.5 MG/3ML) 0.083% IN NEBU
2.5000 mg | INHALATION_SOLUTION | Freq: Once | RESPIRATORY_TRACT | Status: AC
  Administered 2014-07-18: 2.5 mg via RESPIRATORY_TRACT

## 2014-07-21 ENCOUNTER — Telehealth (HOSPITAL_COMMUNITY): Payer: Self-pay | Admitting: Cardiology

## 2014-07-21 ENCOUNTER — Encounter (HOSPITAL_COMMUNITY)
Admission: RE | Admit: 2014-07-21 | Discharge: 2014-07-21 | Disposition: A | Discharge status: Home / Self Care | Payer: Medicare Other | Source: Ambulatory Visit | Attending: Cardiology | Admitting: Cardiology

## 2014-07-21 NOTE — Progress Notes (Signed)
Cardiac Rehab Medication Review by a Pharmacist  Does the patient  feel that his/her medications are working for him/her?  yes  Has the patient been experiencing any side effects to the medications prescribed?  no  Does the patient measure his/her own blood pressure or blood glucose at home?  yes   Does the patient have any problems obtaining medications due to transportation or finances?   no  Understanding of regimen: good Understanding of indications: excellent Potential of compliance: excellent   Pharmacist comments: Pt has a good understanding of medication regimen and indications for use.  He notes decreased urine frequency over the past few months, but he has addressed this concern with his PCP and measures daily weights.   Drucie Opitz, PharmD Clinical Pharmacy Resident Pager: [phone] 07/21/2014 8:52 AM

## 2014-07-21 NOTE — Telephone Encounter (Signed)
Message copied by Xochilth Standish, Sharlot Gowda on Thu Jul 21, 2014  4:25 PM ------      Message from: Larey Dresser      Created: Mon Jul 18, 2014  4:14 PM       ? Interstitial lung disease with restrictive PFTs.  Please refer to pulmonology for evaluation. ------

## 2014-07-21 NOTE — Telephone Encounter (Signed)
Pt aware of results and scheduled for pulm 07/22/14 dr.wright

## 2014-07-25 ENCOUNTER — Encounter: Payer: Self-pay | Admitting: Critical Care Medicine

## 2014-07-25 ENCOUNTER — Other Ambulatory Visit (INDEPENDENT_AMBULATORY_CARE_PROVIDER_SITE_OTHER): Payer: Medicare Other

## 2014-07-25 ENCOUNTER — Encounter (HOSPITAL_COMMUNITY): Payer: Medicare Other

## 2014-07-25 ENCOUNTER — Ambulatory Visit (INDEPENDENT_AMBULATORY_CARE_PROVIDER_SITE_OTHER): Payer: Medicare Other | Admitting: Critical Care Medicine

## 2014-07-25 VITALS — BP 112/56 | HR 66 | Temp 97.0°F | Ht 71.5 in | Wt 269.0 lb

## 2014-07-25 DIAGNOSIS — I2510 Atherosclerotic heart disease of native coronary artery without angina pectoris: Secondary | ICD-10-CM

## 2014-07-25 DIAGNOSIS — R0989 Other specified symptoms and signs involving the circulatory and respiratory systems: Secondary | ICD-10-CM

## 2014-07-25 DIAGNOSIS — R0602 Shortness of breath: Secondary | ICD-10-CM

## 2014-07-25 DIAGNOSIS — D500 Iron deficiency anemia secondary to blood loss (chronic): Secondary | ICD-10-CM

## 2014-07-25 DIAGNOSIS — K921 Melena: Secondary | ICD-10-CM

## 2014-07-25 DIAGNOSIS — Z9861 Coronary angioplasty status: Secondary | ICD-10-CM

## 2014-07-25 DIAGNOSIS — R0609 Other forms of dyspnea: Secondary | ICD-10-CM

## 2014-07-25 DIAGNOSIS — Z5181 Encounter for therapeutic drug level monitoring: Secondary | ICD-10-CM

## 2014-07-25 LAB — CBC WITH DIFFERENTIAL/PLATELET
BASOS PCT: 0.4 % (ref 0.0–3.0)
Basophils Absolute: 0 10*3/uL (ref 0.0–0.1)
Eosinophils Absolute: 0.1 10*3/uL (ref 0.0–0.7)
Eosinophils Relative: 2.1 % (ref 0.0–5.0)
HCT: 32.9 % — ABNORMAL LOW (ref 39.0–52.0)
Hemoglobin: 10.8 g/dL — ABNORMAL LOW (ref 13.0–17.0)
Lymphocytes Relative: 22.6 % (ref 12.0–46.0)
Lymphs Abs: 1.5 10*3/uL (ref 0.7–4.0)
MCHC: 32.9 g/dL (ref 30.0–36.0)
MCV: 87.5 fl (ref 78.0–100.0)
Monocytes Absolute: 0.8 10*3/uL (ref 0.1–1.0)
Monocytes Relative: 12.7 % — ABNORMAL HIGH (ref 3.0–12.0)
Neutro Abs: 4.1 10*3/uL (ref 1.4–7.7)
Neutrophils Relative %: 62.2 % (ref 43.0–77.0)
PLATELETS: 217 10*3/uL (ref 150.0–400.0)
RBC: 3.76 Mil/uL — AB (ref 4.22–5.81)
RDW: 24 % — ABNORMAL HIGH (ref 11.5–15.5)
WBC: 6.6 10*3/uL (ref 4.0–10.5)

## 2014-07-25 LAB — PROTIME-INR
INR: 1 ratio (ref 0.8–1.0)
PROTHROMBIN TIME: 11.5 s (ref 9.6–13.1)

## 2014-07-25 LAB — COMPREHENSIVE METABOLIC PANEL
ALBUMIN: 3.9 g/dL (ref 3.5–5.2)
ALK PHOS: 89 U/L (ref 39–117)
ALT: 44 U/L (ref 0–53)
AST: 33 U/L (ref 0–37)
BUN: 16 mg/dL (ref 6–23)
CO2: 28 mEq/L (ref 19–32)
Calcium: 9.1 mg/dL (ref 8.4–10.5)
Chloride: 101 mEq/L (ref 96–112)
Creatinine, Ser: 1.1 mg/dL (ref 0.4–1.5)
GFR: 70.73 mL/min (ref >60.00)
Glucose, Bld: 87 mg/dL (ref 70–99)
POTASSIUM: 3.9 meq/L (ref 3.5–5.1)
Sodium: 134 mEq/L — ABNORMAL LOW (ref 135–145)
Total Bilirubin: 0.7 mg/dL (ref 0.2–1.2)
Total Protein: 7 g/dL (ref 6.0–8.3)

## 2014-07-25 NOTE — Assessment & Plan Note (Signed)
Dyspnea on exertion with emphysema seen on CT the chest per pulmonary functions well-preserved and no significant airway obstruction seen I suspect that this is largely the basis of chronic anemia Plan The patient needs gastrioenterology  evaluation of anemia given the fact the patient has heme positive stool

## 2014-07-25 NOTE — Assessment & Plan Note (Signed)
Persistently positive stools for blood and associated iron deficiency anemia with associated fatigue and dyspnea Plan Discontinue aspirin Referral to gastroenterology for upper  Endoscopy and colonoscopy

## 2014-07-25 NOTE — Patient Instructions (Signed)
Labs today Stop Aspirin We will try to get you in to GI sooner Return 6 weeks

## 2014-07-25 NOTE — Progress Notes (Addendum)
Subjective:    Patient ID: James [name], male    DOB: February 03, 1928, 80 y.o.   MRN: [id number]  HPI Comments: Progressively worse with dyspnea for 47month. Pt was on xarelto for 6 months for LLE DVT>  Noted dyspnea on this over time and no energy Pt had low Hgb. 7.7.  ??source not yet identified. Notes blood in stool.  Needs colonoscopy/EGD.  Stool card was pos. Notes black stools and tarry Pt in hosp 05/2014 and had cath: DES to LAD.  Anemic Hgb 7.9.  EF 55-60% .   Shortness of Breath This is a new problem. The current episode started more than 1 month ago. The problem occurs constantly. The problem has been gradually worsening. Associated symptoms include leg pain, leg swelling and rhinorrhea. Pertinent negatives include no abdominal pain, chest pain, ear pain, fever, headaches, hemoptysis, neck pain, orthopnea, PND, rash, sore throat, sputum production, swollen glands, syncope, vomiting or wheezing. The symptoms are aggravated by any activity and exercise. Associated symptoms comments: No cough. Risk factors include smoking. He has tried nothing for the symptoms. His past medical history is significant for DVT. There is no history of allergies, aspirin allergies, asthma, bronchiolitis, chronic lung disease, COPD, PE, pneumonia or a recent surgery.  Not had CT Angio 05/2014: no PE.  Emphysematous changes and fibrosis seen  No recent PFTs   Past Medical History  Diagnosis Date  . Bradycardia     Hypertensive  . carotid bruit, right   . hypercholesterolemia     Trig 234/293;takes Simcor daily  . Obesity, morbid   . Vasculitis   . 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 GBeryle Beams Dr. GBeryle Beams, normal platelets  . Dermatitis     legs  . Blood in stool   . Bleeding gums   . Chronic low back pain   . Myalgia and myositis   . Unspecified vitamin D deficiency   . Bladder diverticulum   . Primary osteoarthritis of both knees   . Nephrolithiasis   .  History of elevated glucose   . Thrombophlebitis     right forearm  . History of BPH   . History of colonic polyps     PSharlett Iles . Epididymitis, left     S/P Epidimectomy  . Splenomegaly 05/15/09    abd U/S NML  . Hypertension, benign     takes Micardis daily  . Neuropathy     acute  . Bruises easily   . H/O hiatal hernia   . GERD (gastroesophageal reflux disease)     hx of but doesn't take any meds now  . Urinary frequency   . Urinary urgency   . Nocturia   . Urinary leakage   . Abnormality of gait 03/19/2013  . DVT of lower limb, acute 11/19/2013    Peroneal & distal tibial left 11/01/13  . Polio 1941    "mild case"  . CHF (congestive heart failure)     "low grade" (06/22/2014)  . DVT (deep venous thrombosis) 10/2013    LLE  . Pneumonia, bacterial     RML resolved, spirometry, normal  . Pneumonia 1933  . History of blood transfusion 05/2014  . Anemia 05/2014    "related to Xarelto"  . Iron deficiency anemia 05/2014    "got iron infusion"  . Arthritis     "all over"  . Gout   . Malignant melanoma of ear     right  . Thrombophlebitis  of superficial veins of right lower extremity 06/23/2014    Incidental finding doppler 05/30/14     Family History  Problem Relation Age of Onset  . Thyroid cancer Mother   . Diabetes Father   . Heart disease Father   . Stroke Brother     cerebral hemm  . Colon cancer Brother   . Anesthesia problems Neg Hx   . Hypotension Neg Hx   . Malignant hyperthermia Neg Hx   . Pseudochol deficiency Neg Hx   . Prostate cancer Neg Hx   . Heart attack Father      History   Social History  . Marital Status: Widowed    Spouse Name: N/A    Number of Children: 2  . Years of Education: college   Occupational History  . Retired     1996 [employer]   Social History Main Topics  . Smoking status: Former Smoker -- 2.00 packs/day for 20 years    Types: Cigarettes, Cigars    Quit date: 09/27/1967  . Smokeless tobacco: Never Used    . Alcohol Use: 0.0 oz/week     Comment: "glass of wine a few times/yr"  . Drug Use: No  . Sexual Activity: No   Other Topics Concern  . Not on file   Social History Narrative   Widowed 2013 after 63+ years of marriage, Wife had CHF and multiple myeloma   [name], daughter in Sports coach, lives with patient   Patient does not get regular exercise   No caffeine   Retired from [employer] (E[employer]), retired from [employer]  . Neomycin-Bacitracin Zn-Polymyx     Other reaction(s): RASH  . Penicillins     Other reaction(s): OTHER REACTION: Rash, swelling  . Rofecoxib     Other reaction(s): NOT KNOWN  . Sulfa Antibiotics     Other reaction(s): OTHER  . Sulfamethoxazole-Trimethoprim     REACTION: questional onset of low platelets and bleeding  . Tramadol Hcl     REACTION: dizziness, drowsiness  . Neosporin [Neomycin-Polymyxin-Gramicidin] Rash     Outpatient Prescriptions Prior to Visit  Medication Sig Dispense Refill  . allopurinol (ZYLOPRIM) 300 MG tablet Take 300 mg by mouth daily.      Marland Kitchen aspirin 81 MG chewable tablet Chew 1 tablet (81 mg total) by mouth daily.      . Cholecalciferol (VITAMIN D3) 2000 UNITS capsule Take 2,000 Units by mouth daily.        . clopidogrel (PLAVIX) 75 MG tablet Take 1 tablet (75 mg total) by mouth daily with breakfast.  90 tablet  3  . furosemide (LASIX) 40 MG tablet TAKE 2 TABLETS DAILY  180 tablet  2  . Iron Polysacch Cmplx-B12-FA 150-0.025-1 MG CAPS Take 1 capsule by mouth 2 (two) times daily.  180 each  3  . LIVALO 4 MG TABS TAKE 1 TABLET AT BEDTIME  90 tablet  0  . MICARDIS 40 MG tablet TAKE 1 TABLET DAILY  90 tablet  3  . Misc Natural Products (OSTEO BI-FLEX JOINT SHIELD PO) Take 1 tablet by mouth daily.       . Multiple Vitamin (MULTIVITAMIN) tablet Take 1 tablet by mouth daily.        . nitroGLYCERIN (NITROSTAT) 0.4 MG SL tablet Place 1 tablet (0.4 mg total) under the tongue every 5 (five) minutes as  needed. For chest pain  25 tablet  5  .  pantoprazole (PROTONIX) 40 MG tablet Take 1 tablet (40 mg total) by mouth daily.  90 tablet  3  . potassium chloride SA (KLOR-CON M20) 20 MEQ tablet Take 1 tablet (20 mEq total) by mouth daily.  90 tablet  3  . Probiotic Product (PROBIOTIC & ACIDOPHILUS EX ST PO) Take 1 tablet by mouth daily.      . Psyllium (METAMUCIL PO) Take 20 mLs by mouth every evening.       No facility-administered medications prior to visit.      Review of Systems  Constitutional: Positive for activity change and fatigue. Negative for fever, chills, diaphoresis, appetite change and unexpected weight change.  HENT: Positive for rhinorrhea and voice change. Negative for congestion, dental problem, ear discharge, ear pain, facial swelling, hearing loss, mouth sores, nosebleeds, postnasal drip, sinus pressure, sneezing, sore throat, tinnitus and trouble swallowing.   Eyes: Negative for photophobia, discharge, itching and visual disturbance.  Respiratory: Positive for shortness of breath. Negative for apnea, cough, hemoptysis, sputum production, choking, chest tightness, wheezing and stridor.   Cardiovascular: Positive for leg swelling. Negative for chest pain, palpitations, orthopnea, syncope and PND.  Gastrointestinal: Positive for diarrhea, blood in stool and abdominal distention. Negative for nausea, vomiting, abdominal pain and constipation.  Genitourinary: Negative for dysuria, urgency, frequency, hematuria, flank pain, decreased urine volume and difficulty urinating.  Musculoskeletal: Positive for gait problem. Negative for arthralgias, back pain, joint swelling, myalgias, neck pain and neck stiffness.  Skin: Negative for color change, pallor and rash.  Neurological: Positive for tremors, weakness, light-headedness and numbness. Negative for dizziness, seizures, syncope, speech difficulty and headaches.  Hematological: Negative for adenopathy. Bruises/bleeds easily.   Psychiatric/Behavioral: Negative for confusion, sleep disturbance and agitation. The patient is not nervous/anxious.        Objective:   Physical Exam Filed Vitals:   07/25/14 1145  BP: 112/56  Pulse: 66  Temp: 97 F (36.1 C)  TempSrc: Oral  Height: 5' 11.5" (1.816 m)  Weight: 269 lb (122.018 kg)  SpO2: 98%    Gen: Pleasant, well-nourished, in no distress,  normal affect Pale appearing  ENT: No lesions,  mouth clear,  oropharynx clear, no postnasal drip  Neck: No JVD, no TMG, no carotid bruits  Lungs: No use of accessory muscles, no dullness to percussion, clear without rales or rhonchi  Cardiovascular: RRR, heart sounds normal, no murmur or gallops, no peripheral edema  Abdomen: soft and NT, no HSM,  BS normal  Musculoskeletal: No deformities, no cyanosis or clubbing  Neuro: alert, non focal  Skin: Warm, no lesions or rashes   rectal exam is done there is grossly seen and tarry stools are heme-positive  No results found. PFTs reviewed: mild obstruction only   Lab Results  Component Value Date   WBC 6.6 07/25/2014   HGB 10.8* 07/25/2014   HCT 32.9* 07/25/2014   MCV 87.5 07/25/2014   PLT 217.0 07/25/2014   Lab Results  Component Value Date   ALT 44 07/25/2014   AST 33 07/25/2014   ALKPHOS 89 07/25/2014   BILITOT 0.7 07/25/2014    Recent Labs Lab 07/25/14 1301  NA 134*  K 3.9  CL 101  CO2 28  GLUCOSE 87  BUN 16  CREATININE 1.1  CALCIUM 9.1       Assessment & Plan:   HEMOCCULT POSITIVE STOOL Persistently positive stools for blood and associated iron deficiency anemia with associated fatigue and dyspnea Plan Discontinue aspirin Referral to gastroenterology for upper  Endoscopy and colonoscopy  DYSPNEA ON EXERTION Dyspnea on exertion with emphysema seen on CT the chest per pulmonary functions well-preserved however pfts show obstruction While I suspected  that this is largely the basis of chronic anemia, the hemoglobin was 10.8.  Thus airway disease may  be playing a role.  Plan The patient needs gastrioenterology  evaluation of anemia given the fact the patient has heme positive stool I will start spriva respimat two puff daily    Updated Medication List Outpatient Encounter Prescriptions as of 07/25/2014  Medication Sig  . allopurinol (ZYLOPRIM) 300 MG tablet Take 300 mg by mouth daily.  Marland Kitchen aspirin 81 MG chewable tablet Chew 1 tablet (81 mg total) by mouth daily.  . Cholecalciferol (VITAMIN D3) 2000 UNITS capsule Take 2,000 Units by mouth daily.    . clopidogrel (PLAVIX) 75 MG tablet Take 1 tablet (75 mg total) by mouth daily with breakfast.  . furosemide (LASIX) 40 MG tablet TAKE 2 TABLETS DAILY  . Iron Polysacch Cmplx-B12-FA 150-0.025-1 MG CAPS Take 1 capsule by mouth 2 (two) times daily.  Marland Kitchen LIVALO 4 MG TABS TAKE 1 TABLET AT BEDTIME  . MICARDIS 40 MG tablet TAKE 1 TABLET DAILY  . Misc Natural Products (OSTEO BI-FLEX JOINT SHIELD PO) Take 1 tablet by mouth daily.   . Multiple Vitamin (MULTIVITAMIN) tablet Take 1 tablet by mouth daily.    . nitroGLYCERIN (NITROSTAT) 0.4 MG SL tablet Place 1 tablet (0.4 mg total) under the tongue every 5 (five) minutes as needed. For chest pain  . pantoprazole (PROTONIX) 40 MG tablet Take 1 tablet (40 mg total) by mouth daily.  . potassium chloride SA (KLOR-CON M20) 20 MEQ tablet Take 1 tablet (20 mEq total) by mouth daily.  . Probiotic Product (PROBIOTIC & ACIDOPHILUS EX ST PO) Take 1 tablet by mouth daily.  . Psyllium (METAMUCIL PO) Take 20 mLs by mouth every evening.

## 2014-07-26 ENCOUNTER — Telehealth: Payer: Self-pay | Admitting: Critical Care Medicine

## 2014-07-26 MED ORDER — TIOTROPIUM BROMIDE MONOHYDRATE 2.5 MCG/ACT IN AERS: 2.0000 | INHALATION_SPRAY | Freq: Every day | RESPIRATORY_TRACT | Status: DC

## 2014-07-26 NOTE — Telephone Encounter (Signed)
Result Note     Call pt and tell him is anemia is not that severe    He still needs to be evaluated by GI but would like to try an inhaler        Call in [place] two puff daily   --  I spoke with patient about results and he verbalized understanding and had no questions RX called into [facility] per pt request. Nothing further needed

## 2014-07-27 ENCOUNTER — Encounter (HOSPITAL_COMMUNITY): Payer: Medicare Other

## 2014-07-28 ENCOUNTER — Telehealth: Payer: Self-pay | Admitting: Critical Care Medicine

## 2014-07-28 NOTE — Telephone Encounter (Signed)
Spoke with [place] with [employer]  She states that they received order for home health and will going to pt's home tomorrow  This is just an FYI  Nothing needed

## 2014-07-29 ENCOUNTER — Ambulatory Visit (INDEPENDENT_AMBULATORY_CARE_PROVIDER_SITE_OTHER): Payer: Medicare Other | Admitting: Internal Medicine

## 2014-07-29 ENCOUNTER — Telehealth: Payer: Self-pay | Admitting: Critical Care Medicine

## 2014-07-29 ENCOUNTER — Encounter: Payer: Self-pay | Admitting: Internal Medicine

## 2014-07-29 ENCOUNTER — Encounter (HOSPITAL_COMMUNITY): Payer: Self-pay | Admitting: Pharmacy Technician

## 2014-07-29 ENCOUNTER — Encounter (HOSPITAL_COMMUNITY): Payer: Medicare Other

## 2014-07-29 VITALS — BP 104/50 | HR 64 | Ht 71.25 in | Wt 272.8 lb

## 2014-07-29 DIAGNOSIS — R195 Other fecal abnormalities: Secondary | ICD-10-CM

## 2014-07-29 DIAGNOSIS — Z9861 Coronary angioplasty status: Secondary | ICD-10-CM

## 2014-07-29 DIAGNOSIS — I2510 Atherosclerotic heart disease of native coronary artery without angina pectoris: Secondary | ICD-10-CM

## 2014-07-29 DIAGNOSIS — D509 Iron deficiency anemia, unspecified: Secondary | ICD-10-CM

## 2014-07-29 NOTE — Progress Notes (Signed)
[facility]  James [name]    [id number]    1928/04/07    Assessment and Plan/Recommendations:  Anemia:  Will need upper and lower endoscopy. Has been somewhat corrected with po Iron.  On plavix, but due to recent drug-eluting LAD stent will not hold and would also continue aspirin at this point given his recent drug-eluting stents. Will be some limitations of therapy with polypectomy etc. but should be able to accomplish a diagnostic EGD and colonoscopy without difficulty and may be able to remove small polyps et James [name].The risks and benefits as well as alternatives of endoscopic procedure(s) have been discussed and reviewed. There is a higher risk of bleeding given his need for Plavix during the procedures but I think given the overall risk benefit situation there is outweighing of the benefits versus the risks in this situation. All questions answered. The patient agrees to proceed.  Heme positive stool: Indeterminate source at this time.  Darker stools may be secondary to Iron.  Will need procedures as above.  HPI: James [name] is a 80 y.o. male with a history of CHF and CAD with recent LAD stent who presents today for evaluation for anemia and heme positive stool.  Pt had a DVT in November of 2014 and was placed on Xarelto.  He began having severely decreased energy and was taken off in May of 2015.  His fatigue continued to worsen so some element of CHF was expected.  Evaluation showed pt had mild diastolic dysfunction and anemia with an IDA with a hgb of 7. He was placed on iron and when evaluated by his PCP last month he was noted to have heme + stool and sent for further evaluation here.  He has weakness, fatigue, and DOE 2/2 to his CHF.  He denies N/V, diarrhea, hematochezia, fever, and chills.  His BMs alternate between "pasty, and difficult to clean" and "more dry, and easy to clean." He denies use of NSAIDs and states he had heartburn in the 1960's but has since been without  symptoms on a regimen of apple cider vinegar, apple juice, and grape juice.  His most recent colonoscopy was in 2010 by Dr [name], not requiring routine follow up.  He denies ever having an EGD.    Outpatient Encounter Prescriptions as of 07/29/2014  Medication Sig  . allopurinol (ZYLOPRIM) 300 MG tablet Take 300 mg by mouth daily.  Marland Kitchen aspirin 81 MG chewable tablet Chew 1 tablet (81 mg total) by mouth daily.  . Cholecalciferol (VITAMIN D3) 2000 UNITS capsule Take 2,000 Units by mouth daily.    . clopidogrel (PLAVIX) 75 MG tablet Take 1 tablet (75 mg total) by mouth daily with breakfast.  . furosemide (LASIX) 40 MG tablet TAKE 2 TABLETS DAILY  . Iron Polysacch Cmplx-B12-FA 150-0.025-1 MG CAPS Take 1 capsule by mouth 2 (two) times daily.  Marland Kitchen LIVALO 4 MG TABS TAKE 1 TABLET AT BEDTIME  . MICARDIS 40 MG tablet TAKE 1 TABLET DAILY  . Misc Natural Products (OSTEO BI-FLEX JOINT SHIELD PO) Take 1 tablet by mouth daily.   . Multiple Vitamin (MULTIVITAMIN) tablet Take 1 tablet by mouth daily.    . nitroGLYCERIN (NITROSTAT) 0.4 MG SL tablet Place 1 tablet (0.4 mg total) under the tongue every 5 (five) minutes as needed. For chest pain  . pantoprazole (PROTONIX) 40 MG tablet Take 1 tablet (40 mg total) by mouth daily.  . potassium chloride SA (KLOR-CON M20) 20 MEQ tablet Take 1 tablet (20 mEq total)  by mouth daily.  . Probiotic Product (PROBIOTIC & ACIDOPHILUS EX ST PO) Take 1 tablet by mouth daily.  . Psyllium (METAMUCIL PO) Take 20 mLs by mouth every evening.  . Tiotropium Bromide Monohydrate (SPIRIVA RESPIMAT) 2.5 MCG/ACT AERS Inhale 2 puffs into the lungs daily.    Allergies as of 07/29/2014 - Review Complete 07/25/2014  Allergen Reaction Noted  . Aleve [naproxen sodium]  07/29/2014  . Neomycin-bacitracin zn-polymyx  06/16/2014  . Penicillins    . Rofecoxib    . Sulfa antibiotics  06/16/2014  . Sulfamethoxazole-trimethoprim  06/09/2009  . Tramadol hcl  12/07/2008  . Neosporin  [neomycin-polymyxin-gramicidin] Rash 12/31/2011      Past Surgical History  Procedure Laterality Date  . Cystoscopy w/ stone manipulation  1950's  . Fem cutaneous nerve entrapment  1977    due to surgery (mild lat anesth of bilat legs)  . Limited pelvis/hip bone scan  04/99    negative  . Bone scan  04/00  . Cataract extraction w/ intraocular lens  implant, bilateral Bilateral ~ 2004  . Melanoma excision Right 06/04    ear; Wide local excision,,with flap  . Stress cardiolite  05/11/04    mild inf. ischemia, EF 71%  . Shoulder surgery Left 06/17/2005    "tore it all to pieces when I fell; not fractured"  . Bronchoscopy  09/08    RML collapse with chronic pneumonia, bx neg (Dr. Joya Gaskins)  . Cyst excision Right 03/07/09    middle finger, DIP Joint Mucoid (Dr. Fredna Dow)  . Eyelid & eyebrow lift  1996  . Cyst excision Right 08/15/09    middle finger, DIP Joint Mucoid Cyst (Dr. Fredna Dow)  . Appendectomy  1950  . Excision of mucoid tumor    . Colonoscopy    . Total knee arthroplasty  01/10/2012    Procedure: TOTAL KNEE ARTHROPLASTY;  Surgeon: Augustin Schooling, MD;  Location: [place];  Service: Orthopedics;  Laterality: Left;  . Surgery scrotal / testicular Left ~ 2000    removal of mass, noncancerous  . Cardiac catheterization  05/23/04    mild plague-statin  . Coronary angioplasty with stent placement  06/22/2014    "1"  . Inguinal hernia repair Bilateral 1959  . Inguinal hernia repair Left 1970's     History   Social History  . Marital Status: Widowed    Spouse Name: N/A    Number of Children: 2  . Years of Education: college   Occupational History  . Retired     1996 [employer]   Social History Main Topics  . Smoking status: Former Smoker -- 2.00 packs/day for 20 years    Types: Cigarettes, Cigars    Quit date: 09/27/1967  . Smokeless tobacco: Never Used  . Alcohol Use: No     Comment: "glass of wine a few times/yr"  . Drug Use: No  . Sexual Activity: No     Social History Narrative   Widowed 2013 after 63+ years of marriage, Wife had CHF and multiple myeloma   James [name], daughter in Sports coach, lives with patient   Patient does not get regular exercise   No caffeine   Retired from [employer] ([employer]), retired from [employer]    Review of systems: Positive for: fatigue, weakness, DOE All other ROS negative or as per HPI  Physical Exam: BP 104/50  Pulse 64  Ht 5' 11.25" (1.81 m)  Wt 272 lb 12.8 oz (123.741 kg)  BMI 37.77  kg/m2 Constitutional: Overweight male in NAD Eyes: anicteric Mouth: oral and posterior pharynx free of lesions Neck: supple, no mass or thyromegaly Lungs: clear to auscultation bilaterally. No W/R/R noted. Cardiovascular: S1S2 with regular rate and rhythm, no rubs murmurs or gallops appreciated Abdomen: Obese. Mildly tender to exam.  Negative rebound, non distended. Smooth Liver edge palpated just below costal margin. No splenomegaly appreciated.  Normal bowel sounds. Extremities: 3+ LE edema. Skin: no rash Neuro: alert and oriented x 3 Psych: normal mood and affect  Data Reviewed: Previous pt records and labs.   Legrand Como A. [place], [place] 07/29/2014 8:47 AM     I have personally seen the patient, reviewed and repeated key elements of the history and physical and participated in formation of the assessment and plan the student has documented.  Gatha Mayer, MD, [facility]  AC:QPEAKL Damita Dunnings, MD Lyda Jester, MD and Loralie Champagne, MD

## 2014-07-29 NOTE — Telephone Encounter (Signed)
Spoke with Judeen Hammans from [facility].  She reports they visited pt and his daughters were there as well. They stated they did not feel they needed these services at this time and does not want to waste anyone's time. They were appreciative of the order but refused at this time FYI for PW.

## 2014-07-31 NOTE — Telephone Encounter (Signed)
Noted  I will document their refusal,  He is a H[employer] F[employer] RISK

## 2014-08-01 ENCOUNTER — Encounter (HOSPITAL_COMMUNITY): Payer: Medicare Other

## 2014-08-01 ENCOUNTER — Telehealth: Payer: Self-pay | Admitting: Internal Medicine

## 2014-08-01 NOTE — Telephone Encounter (Signed)
Patient concerned about taking his iron pill the day of his procedure, he said it turns everything dark.  I told him just don't take the AM of his procedure.

## 2014-08-03 ENCOUNTER — Encounter (HOSPITAL_COMMUNITY): Payer: Medicare Other

## 2014-08-05 ENCOUNTER — Encounter (HOSPITAL_COMMUNITY): Payer: Medicare Other

## 2014-08-05 ENCOUNTER — Encounter (HOSPITAL_COMMUNITY): Payer: Self-pay | Admitting: *Deleted

## 2014-08-05 ENCOUNTER — Other Ambulatory Visit (INDEPENDENT_AMBULATORY_CARE_PROVIDER_SITE_OTHER): Payer: Medicare Other

## 2014-08-05 DIAGNOSIS — D509 Iron deficiency anemia, unspecified: Secondary | ICD-10-CM

## 2014-08-05 LAB — CBC WITH DIFFERENTIAL/PLATELET
BASOS PCT: 0.7 % (ref 0.0–3.0)
Basophils Absolute: 0 10*3/uL (ref 0.0–0.1)
Eosinophils Absolute: 0.2 10*3/uL (ref 0.0–0.7)
Eosinophils Relative: 3.6 % (ref 0.0–5.0)
HEMATOCRIT: 32.8 % — AB (ref 39.0–52.0)
HEMOGLOBIN: 10.5 g/dL — AB (ref 13.0–17.0)
LYMPHS ABS: 1.4 10*3/uL (ref 0.7–4.0)
Lymphocytes Relative: 27.2 % (ref 12.0–46.0)
MCHC: 32 g/dL (ref 30.0–36.0)
MCV: 90.2 fl (ref 78.0–100.0)
Monocytes Absolute: 0.6 10*3/uL (ref 0.1–1.0)
Monocytes Relative: 11.8 % (ref 3.0–12.0)
Neutro Abs: 3 10*3/uL (ref 1.4–7.7)
Neutrophils Relative %: 56.7 % (ref 43.0–77.0)
Platelets: 200 10*3/uL (ref 150.0–400.0)
RBC: 3.64 Mil/uL — ABNORMAL LOW (ref 4.22–5.81)
RDW: 22.7 % — AB (ref 11.5–15.5)
WBC: 5.3 10*3/uL (ref 4.0–10.5)

## 2014-08-05 NOTE — Progress Notes (Signed)
08/05/14 1400  OBSTRUCTIVE SLEEP APNEA  Have you ever been diagnosed with sleep apnea through a sleep study? No  Do you snore loudly (loud enough to be heard through closed doors)?  0  Do you often feel tired, fatigued, or sleepy during the daytime? 1  Has anyone observed you stop breathing during your sleep? 0  Do you have, or are you being treated for high blood pressure? 1  BMI more than 35 kg/m2? 1  Age over 78 years old? 1  Gender: 1  Obstructive Sleep Apnea Score 5

## 2014-08-05 NOTE — Progress Notes (Signed)
Spoke with Judeen Hammans at MD's office to clarify if pt should take all AM medications as instructed on DOS which includes Plavix. According to [facility], pt can take Plavix the morning of surgery and I can advise pt of the remainder of medications to take. Pt stated that he takes Protonix at HS.

## 2014-08-08 ENCOUNTER — Encounter (HOSPITAL_COMMUNITY): Payer: Medicare Other

## 2014-08-08 ENCOUNTER — Ambulatory Visit (HOSPITAL_COMMUNITY)
Admission: RE | Admit: 2014-08-08 | Discharge: 2014-08-08 | Disposition: A | Discharge status: Home / Self Care | Payer: Medicare Other | Source: Ambulatory Visit | Attending: Internal Medicine | Admitting: Internal Medicine

## 2014-08-08 ENCOUNTER — Encounter (HOSPITAL_COMMUNITY): Payer: Medicare Other | Admitting: Anesthesiology

## 2014-08-08 ENCOUNTER — Encounter (HOSPITAL_COMMUNITY)
Admission: RE | Disposition: A | Discharge status: Home / Self Care | Payer: Self-pay | Source: Ambulatory Visit | Attending: Internal Medicine

## 2014-08-08 ENCOUNTER — Ambulatory Visit (HOSPITAL_COMMUNITY): Payer: Medicare Other | Admitting: Anesthesiology

## 2014-08-08 ENCOUNTER — Ambulatory Visit: Payer: Medicare Other | Admitting: Nurse Practitioner

## 2014-08-08 ENCOUNTER — Encounter (HOSPITAL_COMMUNITY): Payer: Self-pay

## 2014-08-08 DIAGNOSIS — Z9861 Coronary angioplasty status: Secondary | ICD-10-CM | POA: Insufficient documentation

## 2014-08-08 DIAGNOSIS — K766 Portal hypertension: Secondary | ICD-10-CM | POA: Diagnosis not present

## 2014-08-08 DIAGNOSIS — D509 Iron deficiency anemia, unspecified: Secondary | ICD-10-CM | POA: Diagnosis not present

## 2014-08-08 DIAGNOSIS — I8510 Secondary esophageal varices without bleeding: Secondary | ICD-10-CM | POA: Insufficient documentation

## 2014-08-08 DIAGNOSIS — K219 Gastro-esophageal reflux disease without esophagitis: Secondary | ICD-10-CM | POA: Diagnosis not present

## 2014-08-08 DIAGNOSIS — K319 Disease of stomach and duodenum, unspecified: Secondary | ICD-10-CM | POA: Diagnosis not present

## 2014-08-08 DIAGNOSIS — Z87891 Personal history of nicotine dependence: Secondary | ICD-10-CM | POA: Insufficient documentation

## 2014-08-08 DIAGNOSIS — I8500 Esophageal varices without bleeding: Secondary | ICD-10-CM

## 2014-08-08 DIAGNOSIS — I2510 Atherosclerotic heart disease of native coronary artery without angina pectoris: Secondary | ICD-10-CM | POA: Insufficient documentation

## 2014-08-08 DIAGNOSIS — R195 Other fecal abnormalities: Secondary | ICD-10-CM

## 2014-08-08 DIAGNOSIS — I10 Essential (primary) hypertension: Secondary | ICD-10-CM | POA: Insufficient documentation

## 2014-08-08 DIAGNOSIS — D126 Benign neoplasm of colon, unspecified: Secondary | ICD-10-CM | POA: Diagnosis not present

## 2014-08-08 HISTORY — DX: Esophageal varices without bleeding: I85.00

## 2014-08-08 HISTORY — PX: ESOPHAGOGASTRODUODENOSCOPY: SHX5428

## 2014-08-08 HISTORY — DX: Shortness of breath: R06.02

## 2014-08-08 HISTORY — PX: COLONOSCOPY: SHX5424

## 2014-08-08 SURGERY — EGD (ESOPHAGOGASTRODUODENOSCOPY)
Anesthesia: Monitor Anesthesia Care

## 2014-08-08 MED ORDER — LACTATED RINGERS IV SOLN
INTRAVENOUS | Status: DC
  Administered 2014-08-08: 1000 mL via INTRAVENOUS

## 2014-08-08 MED ORDER — PROPOFOL INFUSION 10 MG/ML OPTIME
INTRAVENOUS | Status: DC | PRN
  Administered 2014-08-08: 100 ug/kg/min via INTRAVENOUS

## 2014-08-08 MED ORDER — FENTANYL CITRATE 0.05 MG/ML IJ SOLN
INTRAMUSCULAR | Status: DC | PRN
  Administered 2014-08-08 (×2): 50 ug via INTRAVENOUS

## 2014-08-08 MED ORDER — SODIUM CHLORIDE 0.9 % IV SOLN: INTRAVENOUS | Status: DC

## 2014-08-08 MED ORDER — LACTATED RINGERS IV SOLN: INTRAVENOUS | Status: DC

## 2014-08-08 MED ORDER — BUTAMBEN-TETRACAINE-BENZOCAINE 2-2-14 % EX AERO
INHALATION_SPRAY | CUTANEOUS | Status: DC | PRN
  Administered 2014-08-08: 2 via TOPICAL

## 2014-08-08 MED ORDER — LACTATED RINGERS IV SOLN
INTRAVENOUS | Status: DC | PRN
  Administered 2014-08-08: 13:00:00 via INTRAVENOUS

## 2014-08-08 NOTE — Anesthesia Preprocedure Evaluation (Addendum)
Anesthesia Evaluation  Patient identified by MRN, date of birth, ID band Patient awake    Reviewed: Allergy & Precautions, H&P , NPO status , Patient's Chart, lab work & pertinent test results  Airway Mallampati: II TM Distance: >3 FB Neck ROM: Full    Dental  (+) Teeth Intact, Dental Advisory Given   Pulmonary former smoker,  breath sounds clear to auscultation        Cardiovascular hypertension, + CAD (Drug eluting stents placed ini July 2876 without complications) Rhythm:Regular Rate:Normal     Neuro/Psych    GI/Hepatic GERD-  Medicated and Controlled,  Endo/Other    Renal/GU      Musculoskeletal   Abdominal   Peds  Hematology   Anesthesia Other Findings   Reproductive/Obstetrics                        Anesthesia Physical Anesthesia Plan  ASA: III  Anesthesia Plan: MAC   Post-op Pain Management:    Induction: Intravenous  Airway Management Planned: Simple Face Mask  Additional Equipment:   Intra-op Plan:   Post-operative Plan: Extubation in OR  Informed Consent: I have reviewed the patients History and Physical, chart, labs and discussed the procedure including the risks, benefits and alternatives for the proposed anesthesia with the patient or authorized representative who has indicated his/her understanding and acceptance.   Dental advisory given  Plan Discussed with: CRNA, Anesthesiologist and Surgeon  Anesthesia Plan Comments:        Anesthesia Quick Evaluation

## 2014-08-08 NOTE — Brief Op Note (Signed)
08/08/2014  1:42 PM  PATIENT:  James [name]  80 y.o. male  PRE-OPERATIVE DIAGNOSIS:  Heme positive stool [792.1]Iron deficiency anemia [280.9]  POST-OPERATIVE DIAGNOSIS:  EGD: esophageal varacies COLON: 2 small cecal polyps not removed, pt currently taking Plavix  PROCEDURE:  Procedure(s): ESOPHAGOGASTRODUODENOSCOPY (EGD) (N/A) COLONOSCOPY (N/A)  SURGEON:  Surgeon(s) and Role:    * Gatha Mayer, MD - Primary  Anesthesia - MAC   1) EGD - Grade 1 esophageal varices - 3 columns - some red wales, mild portal gastropathy - proximal otherwise normal  2) Colonoscopy - diminutive cecal and ascending colon polyps- not removed - on clopidogreal and ASA and not causing problems - he is 86  Rec:  Will get CT abd/pelvis re: portal hypertension

## 2014-08-08 NOTE — Transfer of Care (Signed)
Immediate Anesthesia Transfer of Care Note  Patient: James [name]  Procedure(s) Performed: Procedure(s): ESOPHAGOGASTRODUODENOSCOPY (EGD) (N/A) COLONOSCOPY (N/A)  Patient Location: PACU  Anesthesia Type:MAC  Level of Consciousness: awake, alert  and patient cooperative  Airway & Oxygen Therapy: Patient Spontanous Breathing and Patient connected to nasal cannula oxygen  Post-op Assessment: Report given to PACU RN, Post -op Vital signs reviewed and stable and Patient moving all extremities  Post vital signs: Reviewed and stable  Complications: No apparent anesthesia complications

## 2014-08-08 NOTE — Interval H&P Note (Signed)
History and Physical Interval Note:  08/08/2014 12:35 PM  [name]  has presented today for surgery, with the diagnosis of Heme positive stool [792.1]Iron deficiency anemia [280.9]  The various methods of treatment have been discussed with the patient and family. After consideration of risks, benefits and other options for treatment, the patient has consented to  Procedure(s): ESOPHAGOGASTRODUODENOSCOPY (EGD) (N/A) COLONOSCOPY (N/A) as a surgical intervention .  The patient's history has been reviewed, patient examined, no change in status, stable for surgery.  I have reviewed the patient's chart and labs.  Questions were answered to the patient's satisfaction.     James [name]

## 2014-08-08 NOTE — Anesthesia Postprocedure Evaluation (Signed)
  Anesthesia Post-op Note  Patient: James [name]  Procedure(s) Performed: Procedure(s): ESOPHAGOGASTRODUODENOSCOPY (EGD) (N/A) COLONOSCOPY (N/A)  Patient Location: PACU  Anesthesia Type: MAC   Level of Consciousness: awake, alert  and oriented  Airway and Oxygen Therapy: Patient Spontanous Breathing  Post-op Pain: none  Post-op Assessment: Post-op Vital signs reviewed  Post-op Vital Signs: Reviewed  Last Vitals:  Filed Vitals:   08/08/14 1415  BP: 112/40  Pulse:   Temp:   Resp:     Complications: No apparent anesthesia complications

## 2014-08-08 NOTE — Anesthesia Postprocedure Evaluation (Signed)
  Anesthesia Post-op Note  Patient: James [name]  Procedure(s) Performed: Procedure(s): ESOPHAGOGASTRODUODENOSCOPY (EGD) (N/A) COLONOSCOPY (N/A)  Patient Location: PACU and Endoscopy Unit  Anesthesia Type:MAC  Level of Consciousness: awake, alert  and patient cooperative  Airway and Oxygen Therapy: Patient Spontanous Breathing and Patient connected to nasal cannula oxygen  Post-op Pain: none  Post-op Assessment: Post-op Vital signs reviewed, Patient's Cardiovascular Status Stable, Respiratory Function Stable, Patent Airway and No signs of Nausea or vomiting  Post-op Vital Signs: Reviewed and stable  Last Vitals:  Filed Vitals:   08/08/14 1350  BP:   Pulse: 63  Temp:   Resp: 16    Complications: No apparent anesthesia complications

## 2014-08-08 NOTE — Discharge Instructions (Addendum)
I found dilated veins in the esophagus called esophageal varices. I think you have been leaking blood from these. The presence of these suggests you have cirrhosis of the liver.  I will have my office arrange for a CT scan of abdomen and pelvis to evaluate this further.  Two tiny polyps in the colon - not removed due to potential bleeding risks and do not think that these are or will cause you problems.  I appreciate the opportunity to care for you.  Gatha Mayer, MD, FACG  YOU HAD AN ENDOSCOPIC PROCEDURE TODAY: Refer to the procedure report and other information in the discharge instructions given to you for any specific questions about what was found during the examination. If this information does not answer your questions, please call Dr. Celesta Aver office at [phone] to clarify.   YOU SHOULD EXPECT: Some feelings of bloating in the abdomen. Passage of more gas than usual. Walking can help get rid of the air that was put into your GI tract during the procedure and reduce the bloating. If you had a lower endoscopy (such as a colonoscopy or flexible sigmoidoscopy) you may notice spotting of blood in your stool or on the toilet paper. Some abdominal soreness may be present for a day or two, also.  DIET: Your first meal following the procedure should be a light meal and then it is ok to progress to your normal diet. A half-sandwich or bowl of soup is an example of a good first meal. Heavy or fried foods are harder to digest and may make you feel nauseous or bloated. Drink plenty of fluids but you should avoid alcoholic beverages for 24 hours.   ACTIVITY: Your care partner should take you home directly after the procedure. You should plan to take it easy, moving slowly for the rest of the day. You can resume normal activity the day after the procedure however YOU SHOULD NOT DRIVE, use power tools, machinery or perform tasks that involve climbing or major physical exertion for 24 hours (because  of the sedation medicines used during the test).   SYMPTOMS TO REPORT IMMEDIATELY: A gastroenterologist can be reached at any hour. Please call [phone]  for any of the following symptoms:  Following lower endoscopy (colonoscopy, flexible sigmoidoscopy) Excessive amounts of blood in the stool  Significant tenderness, worsening of abdominal pains  Swelling of the abdomen that is new, acute  Fever of 100 or higher  Following upper endoscopy (EGD, EUS, ERCP, esophageal dilation) Vomiting of blood or coffee ground material  New, significant abdominal pain  New, significant chest pain or pain under the shoulder blades  Painful or persistently difficult swallowing  New shortness of breath  Black, tarry-looking or red, bloody stools  FOLLOW UP:  If any biopsies were taken you will be contacted by phone or by letter within the next 1-3 weeks. Call [phone]  if you have not heard about the biopsies in 3 weeks.  Please also call with any specific questions about appointments or follow up tests.   Esophageal Varices Esophageal varices are blood vessels in the esophagus (the tube that carries food to the stomach). Under normal circumstances, these blood vessels carry very small amounts of blood. If the liver is damaged, and the main vein (portal vein) that carries blood is blocked, larger amounts of blood might back up into these esophageal varices. The esophageal varices are too fragile for this extra blood flow and pressure. They may swell and then break, causing life-threatening bleeding (  hemorrhage). CAUSES  Any kind of liver disease can cause esophageal varices. Cirrhosis of the liver is the most common reason. Other reasons include:  Severe heart failure: When the heart cannot pump blood around the body effectively enough, pressure may rise in the portal vein.  A blood clot in the portal vein.  Sarcoidosis. This is an inflammatory disease that can affect the  liver.  Schistosomiasis. A parasitic infection that can cause liver damage. SYMPTOMS  Symptoms may include:  Vomiting bright red or black coffee ground like material.  Black, tarry stools.  Low blood pressure.  Dizziness.  Loss of consciousness. DIAGNOSIS  When someone has known cirrhosis, their caregiver may screen them for the presence of esophageal varices. Tests that are used include:  Endoscopy (esophagogastroduodenoscopy or EGD). A thin, lighted tube is inserted through the mouth and into the esophagus. The caregiver will rate the varices according to their size and the presence of red streaks. These characteristics help determine the risk of bleeding.  Imaging tests. CT scans and MRI scans can both show esophageal varices. However, they cannot predict likelihood of bleeding. TREATMENT  There are different types of treatment used for esophageal varices. These include:  Variceal ligation. During EGD, the caregiver places a rubber band around the vein to prevent bleeding.  Injection therapy. During EGD, the caregiver can inject the veins with a solution that shrinks them and scars them closed.  Medications can decrease the pressure in the esophageal varices and prevent bleeding.  Balloon tamponade. A tube is put into the esophagus and a balloon is passed down it. When the balloon is inflated, it puts pressures on the veins and stops the bleeding.  Shunt. A small tube is placed within the liver veins. This decreases the blood flow and pressure to the varices, decreasing bleeding risk.  Liver transplant may be done as a last resort. HOME CARE INSTRUCTIONS   Take all medications exactly as directed.  Follow any prescribed diet. Avoid alcohol if recommended.  Follow instructions regarding both rest and physical activity.  Seek help to treat a drinking problem. SEEK IMMEDIATE MEDICAL CARE IF:  You vomit blood or coffee-ground material.  You pass black tarry stools or  bright red blood in the stools.  You are dizzy, lightheaded or faint.  You are unable to eat or drink.  You experience chest pain. Document Released: 02/29/2004 Document Revised: 03/02/2012 Document Reviewed: 02/09/2009 [facility] Patient Information 2015 [place], [place]. This information is not intended to replace advice given to you by your health care provider. Make sure you discuss any questions you have with your health care provider.

## 2014-08-08 NOTE — Op Note (Signed)
[facility] [place], [address]   COLONOSCOPY PROCEDURE REPORT  PATIENT: James [name], James [name].  MR#: [id number] BIRTHDATE: 03-Aug-1928 , 86  yrs. old GENDER: Male ENDOSCOPIST: Gatha Mayer, MD, [facility] PROCEDURE DATE:  08/08/2014 PROCEDURE:   Colonoscopy, diagnostic First Screening Colonoscopy - Avg.  risk and is 50 yrs.  old or older - No.  Prior Negative Screening - Now for repeat screening. N/A  History of Adenoma - Now for follow-up colonoscopy & has been > or = to 3 yrs.  N/A  Polyps Removed Today? No.  Recommend repeat exam, <10 yrs? No. ASA CLASS:   Class IV INDICATIONS:Iron Deficiency Anemia and heme-positive stool. MEDICATIONS: See Anesthesia Report.  DESCRIPTION OF PROCEDURE:   After the risks benefits and alternatives of the procedure were thoroughly explained, informed consent was obtained.  A digital rectal exam revealed no abnormalities of the rectum.   The Pentax Adult Colon [id number] endoscope was introduced through the anus and advanced to the cecum, which was identified by both the appendix and ileocecal valve. No adverse events experienced.   The quality of the prep was good, using MiraLax  The instrument was then slowly withdrawn as the colon was fully examined.   COLON FINDINGS: Two diminutive sessile polyps were found at the cecum and in the ascending colon.   The colon mucosa was otherwise normal.  Retroflexed views revealed no abnormalities. Time to Raymond.  ithdraw time].  The scope was withdrawn and the procedure completed. COMPLICATIONS: There were no complications.  ENDOSCOPIC IMPRESSION: 1.   Two diminutive sessile polyps were found at the cecum and in the ascending colon - not removed 2.   The colon mucosa was otherwise normal  RECOMMENDATIONS: Did not remove polyps as was on clopidogrel and they are not and should not ever cause problems for him in my opinion Appears to have cirrhosis since I discovered  esophageal varices at EGD - think leaking blood from there.  To get CT abd/pelvis and will determine next steps re: diagnostic evaluation and therapy   eSigned:  Gatha Mayer, MD, [facility] 08/08/2014 4:49 PM   cc: [name], MD, Larey Dresser, MD, Murriel Hopper, MD, and [name], MD

## 2014-08-08 NOTE — H&P (View-Only) (Signed)
[facility]  James [name]    [id number]    [birth year]    Assessment and Plan/Recommendations:  Anemia:  Will need upper and lower endoscopy. Has been somewhat corrected with po Iron.  On plavix, but due to recent drug-eluting LAD stent will not hold and would also continue aspirin at this point given his recent drug-eluting stents. Will be some limitations of therapy with polypectomy etc. but should be able to accomplish a diagnostic EGD and colonoscopy without difficulty and may be able to remove small polyps et James [name].The risks and benefits as well as alternatives of endoscopic procedure(s) have been discussed and reviewed. There is a higher risk of bleeding given his need for Plavix during the procedures but I think given the overall risk benefit situation there is outweighing of the benefits versus the risks in this situation. All questions answered. The patient agrees to proceed.  Heme positive stool: Indeterminate source at this time.  Darker stools may be secondary to Iron.  Will need procedures as above.  HPI: James [name] is a 80 y.o. male with a history of CHF and CAD with recent LAD stent who presents today for evaluation for anemia and heme positive stool.  Pt had a DVT in November of 2014 and was placed on Xarelto.  He began having severely decreased energy and was taken off in May of 2015.  His fatigue continued to worsen so some element of CHF was expected.  Evaluation showed pt had mild diastolic dysfunction and anemia with an IDA with a hgb of 7. He was placed on iron and when evaluated by his PCP last month he was noted to have heme + stool and sent for further evaluation here.  He has weakness, fatigue, and DOE 2/2 to his CHF.  He denies N/V, diarrhea, hematochezia, fever, and chills.  His BMs alternate between "pasty, and difficult to clean" and "more dry, and easy to clean." He denies use of NSAIDs and states he had heartburn in the 1960's but has since been without  symptoms on a regimen of apple cider vinegar, apple juice, and grape juice.  His most recent colonoscopy was in 2010 by James [name], not requiring routine follow up.  He denies ever having an EGD.    Outpatient Encounter Prescriptions as of 07/29/2014  Medication Sig  . allopurinol (ZYLOPRIM) 300 MG tablet Take 300 mg by mouth daily.  Marland Kitchen aspirin 81 MG chewable tablet Chew 1 tablet (81 mg total) by mouth daily.  . Cholecalciferol (VITAMIN D3) 2000 UNITS capsule Take 2,000 Units by mouth daily.    . clopidogrel (PLAVIX) 75 MG tablet Take 1 tablet (75 mg total) by mouth daily with breakfast.  . furosemide (LASIX) 40 MG tablet TAKE 2 TABLETS DAILY  . Iron Polysacch Cmplx-B12-FA 150-0.025-1 MG CAPS Take 1 capsule by mouth 2 (two) times daily.  Marland Kitchen LIVALO 4 MG TABS TAKE 1 TABLET AT BEDTIME  . MICARDIS 40 MG tablet TAKE 1 TABLET DAILY  . Misc Natural Products (OSTEO BI-FLEX JOINT SHIELD PO) Take 1 tablet by mouth daily.   . Multiple Vitamin (MULTIVITAMIN) tablet Take 1 tablet by mouth daily.    . nitroGLYCERIN (NITROSTAT) 0.4 MG SL tablet Place 1 tablet (0.4 mg total) under the tongue every 5 (five) minutes as needed. For chest pain  . pantoprazole (PROTONIX) 40 MG tablet Take 1 tablet (40 mg total) by mouth daily.  . potassium chloride SA (KLOR-CON M20) 20 MEQ tablet Take 1 tablet (20 mEq total)  by mouth daily.  . Probiotic Product (PROBIOTIC & ACIDOPHILUS EX ST PO) Take 1 tablet by mouth daily.  . Psyllium (METAMUCIL PO) Take 20 mLs by mouth every evening.  . Tiotropium Bromide Monohydrate (SPIRIVA RESPIMAT) 2.5 MCG/ACT AERS Inhale 2 puffs into the lungs daily.    Allergies as of 07/29/2014 - Review Complete 07/25/2014  Allergen Reaction Noted  . Aleve [naproxen sodium]  07/29/2014  . Neomycin-bacitracin zn-polymyx  06/16/2014  . Penicillins    . Rofecoxib    . Sulfa antibiotics  06/16/2014  . Sulfamethoxazole-trimethoprim  06/09/2009  . Tramadol hcl  12/07/2008  . Neosporin  [neomycin-polymyxin-gramicidin] Rash 12/31/2011      Past Surgical History  Procedure Laterality Date  . Cystoscopy w/ stone manipulation  1950's  . Fem cutaneous nerve entrapment  1977    due to surgery (mild lat anesth of bilat legs)  . Limited pelvis/hip bone scan  04/99    negative  . Bone scan  04/00  . Cataract extraction w/ intraocular lens  implant, bilateral Bilateral ~ 2004  . Melanoma excision Right 06/04    ear; Wide local excision,,with flap  . Stress cardiolite  05/11/04    mild inf. ischemia, EF 71%  . Shoulder surgery Left 06/17/2005    "tore it all to pieces when I fell; not fractured"  . Bronchoscopy  09/08    RML collapse with chronic pneumonia, bx neg (James. Joya [name])  . Cyst excision Right 03/07/09    middle finger, DIP Joint Mucoid (James. Fredna [name])  . Eyelid & eyebrow lift  1996  . Cyst excision Right 08/15/09    middle finger, DIP Joint Mucoid Cyst (James. Fredna [name])  . Appendectomy  1950  . Excision of mucoid tumor    . Colonoscopy    . Total knee arthroplasty  01/10/2012    Procedure: TOTAL KNEE ARTHROPLASTY;  Surgeon: James Schooling, MD;  Location: [facility];  Service: Orthopedics;  Laterality: Left;  . Surgery scrotal / testicular Left ~ 2000    removal of mass, noncancerous  . Cardiac catheterization  05/23/04    mild plague-statin  . Coronary angioplasty with stent placement  06/22/2014    "1"  . Inguinal hernia repair Bilateral 1959  . Inguinal hernia repair Left 1970's     History   Social History  . Marital Status: Widowed    Spouse Name: N/A    Number of Children: 2  . Years of Education: college   Occupational History  . Retired     1996 [employer]   Social History Main Topics  . Smoking status: Former Smoker -- 2.00 packs/day for 20 years    Types: Cigarettes, Cigars    Quit date: 09/27/1967  . Smokeless tobacco: Never Used  . Alcohol Use: No     Comment: "glass of wine a few times/yr"  . Drug Use: No  . Sexual Activity: No     Social History Narrative   Widowed 2013 after 63+ years of marriage, Wife had CHF and multiple myeloma   James [name], daughter in Sports coach, lives with patient   Patient does not get regular exercise   No caffeine   Retired from [employer] (E[employer]), retired from [employer]    Review of systems: Positive for: fatigue, weakness, DOE All other ROS negative or as per HPI  Physical Exam: BP 104/50  Pulse 64  Ht 5' 11.25" (1.81 m)  Wt 272 lb 12.8 oz (123.741 kg)  BMI 37.77  kg/m2 Constitutional: Overweight male in NAD Eyes: anicteric Mouth: oral and posterior pharynx free of lesions Neck: supple, no mass or thyromegaly Lungs: clear to auscultation bilaterally. No W/R/R noted. Cardiovascular: S1S2 with regular rate and rhythm, no rubs murmurs or gallops appreciated Abdomen: Obese. Mildly tender to exam.  Negative rebound, non distended. Smooth Liver edge palpated just below costal margin. No splenomegaly appreciated.  Normal bowel sounds. Extremities: 3+ LE edema. Skin: no rash Neuro: alert and oriented x 3 Psych: normal mood and affect  Data Reviewed: Previous pt records and labs.   Legrand Como A. Desha, [place] 07/29/2014 8:47 AM     I have personally seen the patient, reviewed and repeated key elements of the history and physical and participated in formation of the assessment and plan the student has documented.  Gatha Mayer, MD, [facility]  GT:[id number] Damita Dunnings, MD Lyda Jester, MD and Loralie Champagne, MD

## 2014-08-08 NOTE — Op Note (Signed)
[facility] [place], [address]   ENDOSCOPY PROCEDURE REPORT  PATIENT: James [name], James [name].  MR#: [id number] BIRTHDATE: Jun 18, 1928 , 86  yrs. old GENDER: Male ENDOSCOPIST: Gatha Mayer, MD, [facility] REFERRED BY: PROCEDURE DATE:  08/08/2014 PROCEDURE:  EGD, diagnostic ASA CLASS:     Class IV INDICATIONS:  Iron deficiency anemia.   Heme positive stool. MEDICATIONS: See Anesthesia Report. TOPICAL ANESTHETIC: none  DESCRIPTION OF PROCEDURE: After the risks benefits and alternatives of the procedure were thoroughly explained, informed consent was obtained.  The Pentax Gastroscope [id number] endoscope was introduced through the mouth and advanced to the second portion of the duodenum. Without limitations.  The instrument was slowly withdrawn as the mucosa was fully examined.        ESOPHAGUS: There were 3 columns of small varices in the distal and middle third of the esophagus.  The were and there was evidence of a red wale sign.  STOMACH: Mild portal hypertensive gastropathy was found in the cardia, gastric fundus, and gastric body.  The remainder of the upper endoscopy exam was otherwise normal. Retroflexed views revealed as above.     The scope was then withdrawn from the patient and the procedure completed.  COMPLICATIONS: There were no complications. ENDOSCOPIC IMPRESSION: 1.   There were 3 columns of small varices in the distal and middle third of the esophagus; The were was evidence of a red wale sign 2.   Portal hypertensive gastropathy was found in the cardia, gastric fundus, and gastric body 3.   The remainder of the upper endoscopy exam was otherwise normal  RECOMMENDATIONS: Schedule CT abd/pelvis with contrast re: portal hypertension, cirrhosis Colonoscopy next  eSigned:  Gatha Mayer, MD, [facility] 08/08/2014 4:45 PM   YI:[id number] Johnette Abraham Joya Gaskins, MD, Larey Dresser, MD, [name], MD, and Murriel Hopper, MD

## 2014-08-09 ENCOUNTER — Encounter (HOSPITAL_COMMUNITY): Payer: Self-pay | Admitting: Internal Medicine

## 2014-08-09 ENCOUNTER — Other Ambulatory Visit: Payer: Self-pay

## 2014-08-09 ENCOUNTER — Telehealth: Payer: Self-pay

## 2014-08-09 DIAGNOSIS — K766 Portal hypertension: Secondary | ICD-10-CM

## 2014-08-09 NOTE — Telephone Encounter (Signed)
Pt scheduled for CT of A/P at [facility] CT 08/11/14@3pm . Pt to be NPO after 11am, pt to drink bottle 1 at 1pm, bottle 2 at 2pm. Pt aware and will pick up contrast today.

## 2014-08-10 ENCOUNTER — Encounter (HOSPITAL_COMMUNITY): Payer: Medicare Other

## 2014-08-11 ENCOUNTER — Ambulatory Visit (INDEPENDENT_AMBULATORY_CARE_PROVIDER_SITE_OTHER)
Admit: 2014-08-11 | Discharge: 2014-08-11 | Disposition: A | Discharge status: Home / Self Care | Payer: Medicare Other | Attending: Internal Medicine | Admitting: Internal Medicine

## 2014-08-11 DIAGNOSIS — K766 Portal hypertension: Secondary | ICD-10-CM

## 2014-08-11 MED ORDER — IOHEXOL 300 MG/ML  SOLN
100.0000 mL | Freq: Once | INTRAMUSCULAR | Status: AC | PRN
  Administered 2014-08-11: 100 mL via INTRAVENOUS

## 2014-08-11 NOTE — Progress Notes (Signed)
Quick Note:  I called results re: liver mass  Please call him tomorrow and schedule 1) MR liver with contrast 2) Have him get alpha fetoprotein use liver mass and cirrhosis as dx to get this ______

## 2014-08-12 ENCOUNTER — Other Ambulatory Visit: Payer: Self-pay

## 2014-08-12 ENCOUNTER — Telehealth: Payer: Self-pay

## 2014-08-12 ENCOUNTER — Encounter (HOSPITAL_COMMUNITY): Payer: Medicare Other

## 2014-08-12 DIAGNOSIS — R160 Hepatomegaly, not elsewhere classified: Secondary | ICD-10-CM

## 2014-08-12 DIAGNOSIS — K7460 Unspecified cirrhosis of liver: Secondary | ICD-10-CM

## 2014-08-12 NOTE — Telephone Encounter (Signed)
Message copied by Greggory Keen on Fri Aug 12, 2014 10:01 AM ------      Message from: Silvano Rusk E      Created: Thu Aug 11, 2014  5:01 PM       I called results re: liver mass            Please call him tomorrow and schedule 1) MR liver with contrast 2) Have him get alpha fetoprotein use liver mass and cirrhosis as dx to get this ------

## 2014-08-12 NOTE — Telephone Encounter (Signed)
Patient aware of MRI which is scheduled at [facility] 08/20/14 arrival 10:15am. Instructed no solid foods 4 hours prior to test.

## 2014-08-15 ENCOUNTER — Encounter (HOSPITAL_COMMUNITY): Payer: Medicare Other

## 2014-08-17 ENCOUNTER — Encounter (HOSPITAL_COMMUNITY): Payer: Medicare Other

## 2014-08-19 ENCOUNTER — Encounter (HOSPITAL_COMMUNITY): Payer: Medicare Other

## 2014-08-20 ENCOUNTER — Ambulatory Visit
Admission: RE | Admit: 2014-08-20 | Discharge: 2014-08-20 | Disposition: A | Discharge status: Home / Self Care | Payer: Medicare Other | Source: Ambulatory Visit | Attending: Internal Medicine | Admitting: Internal Medicine

## 2014-08-20 DIAGNOSIS — R160 Hepatomegaly, not elsewhere classified: Secondary | ICD-10-CM

## 2014-08-20 DIAGNOSIS — K7460 Unspecified cirrhosis of liver: Secondary | ICD-10-CM

## 2014-08-20 MED ORDER — GADOXETATE DISODIUM 0.25 MMOL/ML IV SOLN
10.0000 mL | Freq: Once | INTRAVENOUS | Status: AC | PRN
  Administered 2014-08-20: 10 mL via INTRAVENOUS

## 2014-08-22 ENCOUNTER — Other Ambulatory Visit: Payer: Self-pay

## 2014-08-22 ENCOUNTER — Encounter (HOSPITAL_COMMUNITY): Payer: Medicare Other

## 2014-08-22 DIAGNOSIS — R933 Abnormal findings on diagnostic imaging of other parts of digestive tract: Secondary | ICD-10-CM

## 2014-08-22 DIAGNOSIS — I728 Aneurysm of other specified arteries: Secondary | ICD-10-CM

## 2014-08-22 NOTE — Progress Notes (Signed)
Quick Note:  Let him know changes in liver look like abnormal veins - needs hepatic doppler US to evaluate abnormal portal vein branches seen on MRI  Can use dx 793.3  ______

## 2014-08-22 NOTE — Progress Notes (Signed)
Quick Note:  Reviewed films with radiologist and it looks like there is a portal vein thrombosis  ccing Dr. Beryle Beams (he is away currently) Await Doppler US ? Needs change in anti-coag Tx ______

## 2014-08-23 ENCOUNTER — Ambulatory Visit (HOSPITAL_COMMUNITY): Payer: Medicare Other

## 2014-08-23 ENCOUNTER — Encounter: Payer: Self-pay | Admitting: Family Medicine

## 2014-08-23 ENCOUNTER — Ambulatory Visit (INDEPENDENT_AMBULATORY_CARE_PROVIDER_SITE_OTHER): Payer: Medicare Other | Admitting: Family Medicine

## 2014-08-23 VITALS — BP 110/48 | HR 77 | Temp 97.7°F | Wt 274.5 lb

## 2014-08-23 DIAGNOSIS — Z9861 Coronary angioplasty status: Secondary | ICD-10-CM

## 2014-08-23 DIAGNOSIS — D62 Acute posthemorrhagic anemia: Secondary | ICD-10-CM

## 2014-08-23 DIAGNOSIS — R591 Generalized enlarged lymph nodes: Secondary | ICD-10-CM

## 2014-08-23 DIAGNOSIS — I2510 Atherosclerotic heart disease of native coronary artery without angina pectoris: Secondary | ICD-10-CM

## 2014-08-23 DIAGNOSIS — R599 Enlarged lymph nodes, unspecified: Secondary | ICD-10-CM

## 2014-08-23 LAB — CBC WITH DIFFERENTIAL/PLATELET
BASOS ABS: 0 10*3/uL (ref 0.0–0.1)
Basophils Relative: 0.7 % (ref 0.0–3.0)
EOS ABS: 0.2 10*3/uL (ref 0.0–0.7)
Eosinophils Relative: 2.7 % (ref 0.0–5.0)
HCT: 30.9 % — ABNORMAL LOW (ref 39.0–52.0)
Hemoglobin: 9.9 g/dL — ABNORMAL LOW (ref 13.0–17.0)
LYMPHS PCT: 18.2 % (ref 12.0–46.0)
Lymphs Abs: 1.1 10*3/uL (ref 0.7–4.0)
MCHC: 32.2 g/dL (ref 30.0–36.0)
MCV: 89.3 fl (ref 78.0–100.0)
Monocytes Absolute: 0.7 10*3/uL (ref 0.1–1.0)
Monocytes Relative: 11.5 % (ref 3.0–12.0)
Neutro Abs: 4.2 10*3/uL (ref 1.4–7.7)
Neutrophils Relative %: 66.9 % (ref 43.0–77.0)
Platelets: 221 10*3/uL (ref 150.0–400.0)
RBC: 3.46 Mil/uL — ABNORMAL LOW (ref 4.22–5.81)
RDW: 20.1 % — AB (ref 11.5–15.5)
WBC: 6.3 10*3/uL (ref 4.0–10.5)

## 2014-08-23 NOTE — Patient Instructions (Signed)
Go to the lab on the way out.  We'll contact you with your lab report. Take care.  I'll await the ultrasound results.

## 2014-08-23 NOTE — Progress Notes (Signed)
Pre visit review using our clinic review tool, if applicable. No additional management support is needed unless otherwise documented below in the visit note.  Abnormal liver imaging noted, has f/u u/s pending. D/w pt.  H/o GIB noted, with that affecting any consideration for anticoagulation.    Now with recent lesion near R ear, just inferior to the pinna. Pt thought it was a LN, wasn't tender.  Noted a few days ago.  Now resolved.  No fevers.   Still SOB, dec stamina overall.  With trace BLE edema at baseline.  H/o stent and pulmonary w/u noted.   PMH and SH reviewed  ROS: See HPI, otherwise noncontributory.  Meds, vitals, and allergies reviewed.   nad ncat except for healing excision site noted on the R side of the nose- per derm.  Doesn't appear infected.  Single isolated small lesion, <<1cm, feels like a resolving LN No other LA near the R ear, anterior or posterior, no LA near L ear, along the neck or either clavicle OP wnl Neck supple

## 2014-08-24 ENCOUNTER — Encounter (HOSPITAL_COMMUNITY): Payer: Medicare Other

## 2014-08-24 DIAGNOSIS — R591 Generalized enlarged lymph nodes: Secondary | ICD-10-CM | POA: Insufficient documentation

## 2014-08-24 NOTE — Assessment & Plan Note (Signed)
Single lesion, resolving, could have been reactive from the prev derm excision.  No f/u needed unless it enlarges again.  Minimally palpable today. He agrees.

## 2014-08-26 ENCOUNTER — Other Ambulatory Visit: Payer: Self-pay | Admitting: Internal Medicine

## 2014-08-26 ENCOUNTER — Encounter (HOSPITAL_COMMUNITY): Payer: Medicare Other

## 2014-08-26 ENCOUNTER — Ambulatory Visit
Admission: RE | Admit: 2014-08-26 | Discharge: 2014-08-26 | Disposition: A | Discharge status: Home / Self Care | Payer: Medicare Other | Source: Ambulatory Visit | Attending: Internal Medicine | Admitting: Internal Medicine

## 2014-08-26 DIAGNOSIS — R933 Abnormal findings on diagnostic imaging of other parts of digestive tract: Secondary | ICD-10-CM

## 2014-08-26 DIAGNOSIS — I728 Aneurysm of other specified arteries: Secondary | ICD-10-CM

## 2014-08-30 ENCOUNTER — Encounter: Payer: Self-pay | Admitting: Internal Medicine

## 2014-08-30 NOTE — Progress Notes (Signed)
Quick Note:  I called results to patient. I suspect more of a chronic portal vein thrombosis since he has varices and portal gastropathy - (these are where blood leaking I presume) No easy answers here - banding varices could help though a TIPS could do more but more aggressive and encephalopathy a risk His main c/o is dyspnea which must be multifactorial  1) I can band the varices but needs to be off Plavix if ok 2) doubt anti-coag Tx is sensible or would help and would worsen things 3) Can he get an iron infusion?  Am sending to Drs. Velora Heckler and Mclean for input  Gatha Mayer, MD, [facility]  ______

## 2014-08-31 ENCOUNTER — Encounter (HOSPITAL_COMMUNITY): Payer: Medicare Other

## 2014-08-31 NOTE — Progress Notes (Signed)
Quick Note:  Also cc to Dr. Beryle Beams to get his input also ______

## 2014-09-01 ENCOUNTER — Other Ambulatory Visit (HOSPITAL_COMMUNITY): Payer: Self-pay | Admitting: Internal Medicine

## 2014-09-01 ENCOUNTER — Other Ambulatory Visit: Payer: Self-pay

## 2014-09-01 DIAGNOSIS — D509 Iron deficiency anemia, unspecified: Secondary | ICD-10-CM

## 2014-09-01 NOTE — Progress Notes (Signed)
Quick Note:  I have reviewed notes from Drs Murlean Hark and Dargan.  We (GI)will call patient and explain best we can do right now is to give iron infusion (feraheme x 2) which I will order. Will have him see me in 6-8 weeks. CBC in 1 month from feraheme We will explain that breathing problems from lungs heart and anemia and anemia is what we can change     CBC in 1 month after iron infusion  ______

## 2014-09-02 ENCOUNTER — Encounter (HOSPITAL_COMMUNITY): Payer: Medicare Other

## 2014-09-05 ENCOUNTER — Encounter: Payer: Self-pay | Admitting: Oncology

## 2014-09-05 ENCOUNTER — Ambulatory Visit (INDEPENDENT_AMBULATORY_CARE_PROVIDER_SITE_OTHER): Payer: Medicare Other | Admitting: Pharmacist

## 2014-09-05 ENCOUNTER — Encounter (HOSPITAL_COMMUNITY): Payer: Medicare Other

## 2014-09-05 ENCOUNTER — Ambulatory Visit (INDEPENDENT_AMBULATORY_CARE_PROVIDER_SITE_OTHER): Payer: Medicare Other | Admitting: Oncology

## 2014-09-05 VITALS — BP 138/60 | HR 65 | Temp 97.5°F | Ht 71.5 in | Wt 275.6 lb

## 2014-09-05 DIAGNOSIS — D509 Iron deficiency anemia, unspecified: Secondary | ICD-10-CM

## 2014-09-05 DIAGNOSIS — I81 Portal vein thrombosis: Secondary | ICD-10-CM | POA: Insufficient documentation

## 2014-09-05 DIAGNOSIS — Z7902 Long term (current) use of antithrombotics/antiplatelets: Secondary | ICD-10-CM

## 2014-09-05 DIAGNOSIS — I2510 Atherosclerotic heart disease of native coronary artery without angina pectoris: Secondary | ICD-10-CM

## 2014-09-05 DIAGNOSIS — I519 Heart disease, unspecified: Secondary | ICD-10-CM

## 2014-09-05 DIAGNOSIS — K766 Portal hypertension: Secondary | ICD-10-CM

## 2014-09-05 DIAGNOSIS — I82402 Acute embolism and thrombosis of unspecified deep veins of left lower extremity: Secondary | ICD-10-CM

## 2014-09-05 LAB — CBC WITH DIFFERENTIAL/PLATELET
BASOS ABS: 0.1 10*3/uL (ref 0.0–0.1)
BASOS PCT: 1 % (ref 0–1)
Eosinophils Absolute: 0.2 10*3/uL (ref 0.0–0.7)
Eosinophils Relative: 3 % (ref 0–5)
HCT: 31.4 % — ABNORMAL LOW (ref 39.0–52.0)
Hemoglobin: 9.8 g/dL — ABNORMAL LOW (ref 13.0–17.0)
Lymphocytes Relative: 20 % (ref 12–46)
Lymphs Abs: 1.1 10*3/uL (ref 0.7–4.0)
MCH: 27.9 pg (ref 26.0–34.0)
MCHC: 31.2 g/dL (ref 30.0–36.0)
MCV: 89.5 fL (ref 78.0–100.0)
Monocytes Absolute: 0.6 10*3/uL (ref 0.1–1.0)
Monocytes Relative: 11 % (ref 3–12)
NEUTROS ABS: 3.5 10*3/uL (ref 1.7–7.7)
NEUTROS PCT: 65 % (ref 43–77)
Platelets: 245 10*3/uL (ref 150–400)
RBC: 3.51 MIL/uL — ABNORMAL LOW (ref 4.22–5.81)
RDW: 16.8 % — ABNORMAL HIGH (ref 11.5–15.5)
WBC: 5.4 10*3/uL (ref 4.0–10.5)

## 2014-09-05 LAB — RETICULOCYTES
ABS Retic: 44.9 10*3/uL (ref 19.0–186.0)
RBC.: 3.51 MIL/uL — ABNORMAL LOW (ref 4.22–5.81)
RETIC CT PCT: 1.28 % (ref 0.4–2.3)

## 2014-09-05 LAB — POCT INR: INR: 1.1

## 2014-09-05 LAB — IRON AND TIBC
%SAT: 8 % — ABNORMAL LOW (ref 20–55)
Iron: 23 ug/dL — ABNORMAL LOW (ref 42–165)
TIBC: 299 ug/dL (ref 215–435)
UIBC: 276 ug/dL (ref 125–400)

## 2014-09-05 LAB — D-DIMER, QUANTITATIVE: D-Dimer, Quant: 6.08 ug/mL-FEU — ABNORMAL HIGH (ref 0.00–0.48)

## 2014-09-05 LAB — FERRITIN: Ferritin: 14 ng/mL — ABNORMAL LOW (ref 22–322)

## 2014-09-05 MED ORDER — WARFARIN SODIUM 5 MG PO TABS: 5.0000 mg | ORAL_TABLET | Freq: Every day | ORAL | Status: DC

## 2014-09-05 NOTE — Progress Notes (Signed)
Pt instructed on how to give self Lovenox injection - pt gave self injection; daughter also taught. 1 box of Lovenox given to pt per Dr Beryle Beams.

## 2014-09-05 NOTE — Patient Instructions (Signed)
Patient instructed to take medications as defined in the Anti-coagulation Track section of this encounter.  Patient instructed to TAKE today's dose.  Patient instructed on technique of self-injection of Lovenox 120mg  to be administered as taught here in [facility], subcutaneously administered every 12 hours.  Patient verbalized understanding of these instructions.

## 2014-09-05 NOTE — Patient Instructions (Signed)
To lab today Return visit 10:15 AM on 11/23 lab 1 week before visit

## 2014-09-05 NOTE — Progress Notes (Signed)
Patient ID: James [name], male   DOB: 1928/08/23, 80 y.o.   MRN: [id number] Hematology and Oncology Follow Up Visit  GERELL FORTSON [id number] 04-19-1928 80 y.o. 09/05/2014 8:35 PM   Principle Diagnosis: Encounter Diagnoses  Name Primary?  . Iron deficiency anemia   . DVT (deep venous thrombosis), left   . Portal vein thrombosis Yes     Interim History:   Extended visit for this pleasant but complicated 80 year old man initially diagnosed with ITP in 2010, likely drug related (Septra) which completely resolved with a short course of steroids and discontinuation of medication. I was asked to see him again in December 2014 after he sustained a left lower extremity DVT in the posterior tibial and peroneal veins documented on a Doppler study done 11/01/2013. He was anticoagulated for 6 months with Xarelto.  I saw him again on 06/23/2014 when he was admitted with a sudden unexplained fall in his hemoglobin from baseline 13 g in December 2014 down to 7.9 g at time of 05/29/2014 at admission. This appeared  to be iron deficiency with a low ferritin. No gross evidence for acute hemorrhage but it was suspected that he may have had a subacute bleed while on the Xarelto which had recently been stopped. He was given one unit of blood. He was started on iron.  He underwent a cardiac evaluation for progressive dyspnea and echocardiogram findings of diastolic dysfunction. He had a 60-70% proximal LAD lesion on cardiac catheterization July 1 and a drug eluting stent was placed. He was put on aspirin and Plavix. The Xarelto was discontinued.  He subsequently underwent outpatient upper endoscopy and colonoscopy on August 17. He was unexpectedly found to have mid and distal esophageal varices and evidence of early portal hypertension. 2 tiny sessile polyps noted in the cecum and ascending colon which were not removed due to his Plavix. No malignant changes in the colon or upper aerodigestive tract.  In view of  the endoscopy findings, he underwent an MRI scan of his liver on 08/20/2014. There were vascular abnormalities in the area of the right portal vein but no parenchymal lesions in the liver and no other morphologic changes that would be typically seen with cirrhosis. There was fatty atrophy of the pancreas but fatty changes in the liver were not mentioned. This study was followed with a noninvasive color and duplex Doppler ultrasound of the liver done on September 4 which showed complete occlusion of the main, right, and left portal veins. Splenic vein was patent. Of note at no time did the patient have any abdominal pain.  Initial impression was that the portal vein thrombosis was chronic however I obtained a d-dimer study on the patient today which is significantly elevated at 6.08 units. It was 3.13 units at time of his left lower extremity DVT but had normalized down to 0.35 units via 05/05/2014. I get additional history from his daughter today who reports that one of the patient's sister also had portal vein thrombosis and another sister has also had problems with blood clots. He only had a partial coagulation evaluation evaluation back in January to rule out presence of antiphospholipid antibodies.  Patient is not having any pain in his abdomen or his left lower extremity. He has developed pain in his right groin area radiating down his right leg. He has had some pain in the index finger of his left hand.      Medications: reviewed  Allergies:  Allergies  Allergen Reactions  . Aleve [  Naproxen Sodium] Other (See Comments)    Platlet drop  . Penicillins     Other reaction(s): OTHER REACTION: Rash, swelling  . Rofecoxib Other (See Comments)     NOT KNOWN  . Sulfa Antibiotics Other (See Comments)    NOT KNOWN  . Sulfamethoxazole-Trimethoprim Other (See Comments)     low platelets and bleeding  . Tramadol Hcl Other (See Comments)    dizziness, drowsiness  . Neomycin-Bacitracin Zn-Polymyx  Rash  . Neosporin [Neomycin-Polymyxin-Gramicidin] Rash    Review of Systems: Constitutional: His appetite has been decreased. Increased fatigue and malaise. No weight loss. Hematology:  Stools are black from oral iron ENT ROS: No sore throat Breast ROS:  Respiratory ROS: Dyspnea with exertion Cardiovascular ROS: No ischemic type chest pain or palpitations  Gastrointestinal ROS: No change in bowel habit. See above    Genito-Urinary ROS: Not questioned  Musculoskeletal ROS: See above Neurological ROS: No headache or change in vision Dermatological ROS: No rash or ecchymosis Remaining ROS negative:   Physical Exam: Blood pressure 138/60, pulse 65, temperature 97.5 F (36.4 C), temperature source Oral, height 5' 11.5" (1.816 m), weight 275 lb 9.6 oz (125.011 kg), SpO2 99.00%. Wt Readings from Last 3 Encounters:  09/05/14 275 lb 9.6 oz (125.011 kg)  08/23/14 274 lb 8 oz (124.512 kg)  07/29/14 272 lb 12.8 oz (123.741 kg)     General appearance: Well-nourished Caucasian man HENNT: Pharynx no erythema, exudate, mass, or ulcer. No thyromegaly or thyroid nodules Lymph nodes: No cervical, supraclavicular, or axillary lymphadenopathy Breasts:  Lungs: Clear to auscultation, resonant to percussion throughout Heart: Regular rhythm, no murmur, no gallop, no rub, no click, no edema Abdomen: Soft, nontender, normal bowel sounds, no mass, no organomegaly Extremities: No edema, no calf tenderness Musculoskeletal: no joint deformities GU:  Vascular: Carotid pulses 2+, no bruits, distal pulses:  Neurologic: Alert, oriented, PERRLA, optic discs sharp and vessels normal, no hemorrhage or exudate, cranial nerves grossly normal, motor strength 5 over 5, reflexes absent but symmetric, upper body coordination normal, gait normal, Skin: No rash or ecchymosis  Lab Results: CBC W/Diff    Component Value Date/Time   WBC 5.4 09/05/2014 1009   WBC 7.6 11/24/2013 0812   RBC 3.51* 09/05/2014 1009   RBC  3.51* 09/05/2014 1009   RBC 4.28 11/24/2013 0812   HGB 9.8* 09/05/2014 1009   HGB 13.0 11/24/2013 0812   HCT 31.4* 09/05/2014 1009   HCT 40.3 11/24/2013 0812   PLT 245 09/05/2014 1009   PLT 271 11/24/2013 0812   MCV 89.5 09/05/2014 1009   MCV 94.2 11/24/2013 0812   MCH 27.9 09/05/2014 1009   MCH 30.4 11/24/2013 0812   MCHC 31.2 09/05/2014 1009   MCHC 32.3 11/24/2013 0812   RDW 16.8* 09/05/2014 1009   RDW 14.7* 11/24/2013 0812   LYMPHSABS 1.1 09/05/2014 1009   LYMPHSABS 1.9 11/24/2013 0812   MONOABS 0.6 09/05/2014 1009   MONOABS 0.4 11/24/2013 0812   EOSABS 0.2 09/05/2014 1009   EOSABS 0.2 11/24/2013 0812   BASOSABS 0.1 09/05/2014 1009   BASOSABS 0.0 11/24/2013 0812     Chemistry      Component Value Date/Time   NA 134* 07/25/2014 1301   NA 141 11/24/2013 0813   K 3.9 07/25/2014 1301   K 3.8 11/24/2013 0813   CL 101 07/25/2014 1301   CO2 28 07/25/2014 1301   CO2 23 11/24/2013 0813   BUN 16 07/25/2014 1301   BUN 12.3 11/24/2013 0813   CREATININE 1.1  07/25/2014 1301   CREATININE 0.89 05/27/2014 1612   CREATININE 0.9 11/24/2013 0813      Component Value Date/Time   CALCIUM 9.1 07/25/2014 1301   CALCIUM 9.5 11/24/2013 0813   ALKPHOS 89 07/25/2014 1301   ALKPHOS 87 11/24/2013 0813   AST 33 07/25/2014 1301   AST 25 11/24/2013 0813   ALT 44 07/25/2014 1301   ALT 26 11/24/2013 0813   BILITOT 0.7 07/25/2014 1301   BILITOT 0.57 11/24/2013 0813       Radiological Studies: Ct Abdomen Pelvis W Contrast  08/11/2014   CLINICAL DATA:  Anemia.  Blood in stool.  EXAM: CT ABDOMEN AND PELVIS WITH CONTRAST  TECHNIQUE: Multidetector CT imaging of the abdomen and pelvis was performed using the standard protocol following bolus administration of intravenous contrast.  CONTRAST:  160m OMNIPAQUE IOHEXOL 300 MG/ML  SOLN  COMPARISON:  None  FINDINGS: No pleural effusion or pericardial identified. The lung bases are clear.  Low-attenuation lesion within the right hepatic lobe with a peripheral area of enhancement is identified measuring 2.5 cm,  image 18/series 2. This is favored to represent a benign hemangioma. A second, more ill-defined low attenuation area within segment 6 of the liver is noted. This measures 7 cm, image 18/series 5. The gallbladder appears normal. No biliary dilatation. Normal appearance of the pancreas. The spleen is unremarkable.  There is a right adrenal myelolipoma measuring 3.3 cm, image 103/series 602. Normal appearance of the left adrenal gland. The kidneys are both normal. The urinary bladder appears normal. Prostate gland enlargement is noted with mass effect upon the bladder base.  Previous repair of right inguinal hernia. There is a left inguinal hernia which contains fat only.  Calcified atherosclerotic disease involves the abdominal aorta. No aneurysm. There is no retroperitoneal adenopathy. No pelvic or inguinal adenopathy.  The stomach is normal. The small bowel loops have a normal course and caliber. No bowel obstruction. Normal appearance of the colon.  Review of the visualized osseous structures shows a sclerotic lesion within the right iliac bone with chondroid matrix. Favored to represent enchondroma.  IMPRESSION: 1. Ill defined abnormal area of low attenuation within segment 6 of the liver. The appearance is nonspecific. Cannot rule out mass. Further evaluation with contrast enhanced MRI of the liver is recommended. 2. A second lesion in the liver is also nonspecific but is favored to represent a benign hemangioma. Attention this lesion on MRI is also recommended. 3. Prostate gland enlargement. 4. Right adrenal myelolipoma These results will be called to the ordering clinician or representative by the Radiologist Assistant, and communication documented in the PACS or zVision Dashboard.   Electronically Signed   By: TKerby MoorsM.D.   On: 08/11/2014 16:04   Mr Liver W Wo Contrast  08/20/2014   CLINICAL DATA:  Evaluate liver lesion noted on recent CT examination. Cirrhosis.  EXAM: MRI ABDOMEN WITHOUT AND WITH  CONTRAST  TECHNIQUE: Multiplanar multisequence MR imaging of the abdomen was performed both before and after the administration of intravenous contrast.  CONTRAST:  10 mL of Eovist.  COMPARISON:  CT of the abdomen and pelvis 08/11/2014.  FINDINGS: Study is somewhat limited by extensive patient respiratory motion, particularly on the early arterial phase postcontrast imaging.  In the central aspect of segment 7 of the liver there is a well-circumscribed 2.6 cm lesion which is low signal intensity on T1 weighted images, high signal intensity on T2 weighted images, and demonstrates some peripheral nodular enhancement on post gadolinium imaging which  subsequently becomes uniform signal intensity with the remainder of the blood pool on delayed imaging, compatible with a cavernous hemangioma.  There is an unusual appearance of the posterior branches of the right portal vein, involving all of the distribution in segment 6, and much of the distribution of segments 7, throughout which the branches of the portal vein appear ectatic or frankly aneurysmal (particularly aneurysmal in the periphery of segment 6). Notably, on precontrast T2 weighted images there is no signal void throughout the portal vein (generally mildly hyperintense T2 signal intensity), and on white blood images (true FISP) there is relatively low signal intensity throughout the portal vein, particularly in the right lobe of the liver, suggesting either very sluggish flow or complete occlusion. On post-contrast images there is an apparent filling defect at the level of the splenoportal confluence extending into the proximal main portal vein, which appears dilated measuring up to 19 mm in the porta hepatis. The remainder of the portal vein demonstrates relatively low signal intensity (much less than other vascular structures), concerning for very sluggish flow or potential chronic occlusion. On 20 minute delayed post-contrast images, the aneurysmal dilatation  of portal vein branches throughout segment 7, and in particular segment 6 of the liver is very apparent. Trace volume of perihepatic ascites, best appreciated on the coronal T2 weighted sequences.  The appearance of the gallbladder, spleen, bilateral adrenal glands and bilateral kidneys is unremarkable. Mild fatty atrophy in the pancreas. There are all some multifocal small lesions that are T1 hypo intense and T2 hyperintense throughout the pancreatic parenchyma which demonstrate no definite enhancement on post gadolinium images, favored to represent multiple tiny pseudocysts.  IMPRESSION: 1. Highly unusual appearance of the posterior segments of the right lobe of the liver, most severe in segment 6. Specifically, throughout these regions there is aneurysmal dilatation of the portal vein branches, particularly peripherally in segment 6 of the liver. There appears to be abnormal blood flow starting at the level the splenoportal confluence throughout much of the portal vein and distal branches, which could suggest either very sluggish flow or chronic occlusion. Further evaluation with duplex ultrasound of the liver is suggested at this time. 2. There is also a small lesion in the central aspect of segment 8 of the liver which has imaging characteristics compatible with a cavernous hemangioma. 3. Trace volume of perihepatic ascites. 4. No definite morphologic changes in the liver to strongly suggest cirrhosis at this time. 5. Fatty atrophy of the pancreas with numerous tiny sub cm cystic lesions, which likely represent small pancreatic pseudocysts, presumably related to prior episodes of pancreatitis. Attention on a followup MRI of the abdomen with and without IV gadolinium in 1 year is suggested to confirm stability or resolution of these lesions.   Electronically Signed   By: Vinnie Langton M.D.   On: 08/20/2014 15:16   US Abdomen Limited  08/26/2014   CLINICAL DATA:  Abnormal portal vein on MRI  EXAM: DUPLEX  ULTRASOUND OF LIVER  TECHNIQUE: Color and duplex Doppler ultrasound was performed to evaluate the hepatic in-flow and out-flow vessels.  COMPARISON:  MRI of the abdomen 08/20/2014; CT abdomen 08/11/2014  FINDINGS: Portal Vein Velocities  Main:  Occluded  Right:  Occluded  Left:  Occluded  Hepatic Vein Velocities  Right:  37 cm/sec  Middle:  22 cm/sec  Left:  24 cm/sec  Hepatic Artery Velocity:  108 cm/sec  Splenic Vein Velocity:  17 cm/sec  Varices: None  Ascites: Trace perihepatic ascites  Gallbladder: Numerous  tiny echogenic foci throughout the gallbladder consistent with sludge versus small stones. No gallbladder wall thickening or pericholecystic fluid. Per the sonographer, the sonographic Percell Miller sign is negative.  Common bile duct:  Within normal limits at 6 mm  Liver: 2.1 x 2.7 x 2.0 cm circumscribed hyperechoic mass in the right hepatic lobe corresponds with a hemangioma seen on the recent cross-sectional imaging studies.  IMPRESSION: 1. Complete occlusion of the main, right and left portal veins. The main portal vein is expanded with low-level internal echoes suggestive of acute thrombus. 2. 2.7 cm hemangioma in the right hemi liver. 3. Gallbladder sludge and/or small stones. No secondary sonographic findings to suggest acute cholecystitis. 4. Trace perihepatic ascites may be related to acute/subacute portal venous thrombosis. 5. The splenic vein remains patent. Signed,  Criselda Peaches, MD  [facility]  [facility]   Electronically Signed   By: Jacqulynn Cadet M.D.   On: 08/26/2014 14:37   [place] Art/ven Flow Abd Pelv Doppler  08/26/2014   CLINICAL DATA:  Abnormal portal vein on MRI  EXAM: DUPLEX ULTRASOUND OF LIVER  TECHNIQUE: Color and duplex Doppler ultrasound was performed to evaluate the hepatic in-flow and out-flow vessels.  COMPARISON:  MRI of the abdomen 08/20/2014; CT abdomen 08/11/2014  FINDINGS: Portal Vein Velocities  Main:  Occluded  Right:   Occluded  Left:  Occluded  Hepatic Vein Velocities  Right:  37 cm/sec  Middle:  22 cm/sec  Left:  24 cm/sec  Hepatic Artery Velocity:  108 cm/sec  Splenic Vein Velocity:  17 cm/sec  Varices: None  Ascites: Trace perihepatic ascites  Gallbladder: Numerous tiny echogenic foci throughout the gallbladder consistent with sludge versus small stones. No gallbladder wall thickening or pericholecystic fluid. Per the sonographer, the sonographic Percell Miller sign is negative.  Common bile duct:  Within normal limits at 6 mm  Liver: 2.1 x 2.7 x 2.0 cm circumscribed hyperechoic mass in the right hepatic lobe corresponds with a hemangioma seen on the recent cross-sectional imaging studies.  IMPRESSION: 1. Complete occlusion of the main, right and left portal veins. The main portal vein is expanded with low-level internal echoes suggestive of acute thrombus. 2. 2.7 cm hemangioma in the right hemi liver. 3. Gallbladder sludge and/or small stones. No secondary sonographic findings to suggest acute cholecystitis. 4. Trace perihepatic ascites may be related to acute/subacute portal venous thrombosis. 5. The splenic vein remains patent. Signed,  Criselda Peaches, MD  [facility]  [facility]   Electronically Signed   By: Jacqulynn Cadet M.D.   On: 08/26/2014 14:37    Impression:  #1. Recurrent thrombotic events in disparate locations in this elderly man suggesting a generalized coagulopathy. Strong family history of clotting came to light today. No evidence for occult malignancy on multiple evaluations done over the last 6 months outlined above.  Given the significant and acute elevation of his D. dimer I believe that the portal vein thrombosis was recent. As such, literature does support anticoagulation. I called the patient back to the office. He was started on low molecular weight heparin 1 mg per kilogram equals 120 mg subcutaneous twice daily and Coumadin 5 mg daily. Since he  has a drug eluting stent, we cannot stop his aspirin or Plavix. He and his daughter met with . Dr. [name], PhD pharmacist, and was advised on the use and risks of adding full dose anticoagulation to his antiplatelet regimen while I was in attendance. I feel that  the benefit of anticoagulation given the above events justifies the potential bleeding risk. I will avoid Xarelto since there is presumptive evidence that it may have been related to his initial drop in hemoglobin and for the fact that we still do not have a reversal agent if we do run into bleeding complications. His daughter is in the medical field and will supervise his medications.  I'm going to get additional laboratory studies with respect to etiologies of hypercoagulation.  #2. Early portal hypertension likely related to acute portal vein thrombosis no obvious signs of underlying liver pathology. See discussion above  #3. Coronary artery disease status post LAD stent See discussion above  #4. Diastolic cardiac dysfunction  #5. Likely subacute GI bleed June 2015  #6. Iron malabsorption He is scheduled to receive parenteral iron tomorrow and again in 1 week through Dr. Celesta Aver office. I will repeat iron studies in about 2 months. I told him to stop the oral iron     CC: Patient Care Team: Tonia Ghent, MD as PCP - General [name], MD as Consulting Physician (Pulmonary Disease) Larey Dresser, MD as Consulting Physician (Cardiology)   Annia Belt, MD 9/14/20158:35 PM

## 2014-09-05 NOTE — Progress Notes (Signed)
Anti-Coagulation Progress Note  James [name] is a 80 y.o. male who is currently on an anti-coagulation regimen.    RECENT RESULTS: Recent results are below, the most recent result is correlated with a dose of ZERO mg. per week, i.e., patient is starting de novo upon bridge therapy with concomitant LMWH 1mg  enoxaparin/kg SQ q12h + warfarin at 5mg  PO QD; after 4 days overlap, i.e. On 5th day--the INR response will be evaluated for target range 2.0 - 3.0  Patient has been provided 10 syringes of Lovenox 120mg  and was taught by myself as well as Morrison Old RN on injection technique: Lab Results  Component Value Date   INR 1.1 09/05/2014   INR 1.0 07/25/2014   INR 1.00 06/21/2014    ANTI-COAG DOSE: Anticoagulation Dose Instructions as of 09/05/2014     [name] Tue Wed Thu Fri Sat   New Dose 0 mg 5 mg 5 mg 5 mg 5 mg 0 mg 0 mg       ANTICOAG SUMMARY: Anticoagulation Episode Summary   Current INR goal 2.0-3.0  Next INR check 09/09/2014  INR from last check 1.1! (09/05/2014)  Weekly max dose   Target end date Indefinite  [facility] check [facility]  Preferred lab   Send INR reminders to    Indications  Portal vein thrombosis [452] Encounter for long-term (current) use of antiplatelets/antithrombotics [V58.63]        Comments       Anticoagulation Care Providers   Provider Role Specialty Phone number   Annia Belt, MD  Oncology [phone]      ANTICOAG TODAY: Anticoagulation Summary as of 09/05/2014   INR goal 2.0-3.0  Selected INR 1.1! (09/05/2014)  Next INR check 09/09/2014  Target end date Indefinite   Indications  Portal vein thrombosis [452] Encounter for long-term (current) use of antiplatelets/antithrombotics [V58.63]      Anticoagulation Episode Summary   [facility] check location [facility]   Preferred lab    Send INR reminders to    Comments     Anticoagulation Care Providers   Provider Role Specialty Phone number   Annia Belt, MD   Oncology [phone]      PATIENT INSTRUCTIONS: Patient Instructions  Patient instructed to take medications as defined in the Anti-coagulation Track section of this encounter.  Patient instructed to TAKE today's dose.  Patient instructed on technique of self-injection of Lovenox 120mg  to be administered as taught here in [facility], subcutaneously administered every 12 hours.  Patient verbalized understanding of these instructions.       FOLLOW-UP Return in 4 days (on 09/09/2014) for Follow up INR at 0930h.  [name], III Pharm.D., CACP

## 2014-09-06 ENCOUNTER — Encounter (HOSPITAL_COMMUNITY)
Admission: RE | Admit: 2014-09-06 | Discharge: 2014-09-06 | Disposition: A | Discharge status: Home / Self Care | Payer: Medicare Other | Source: Ambulatory Visit | Attending: Internal Medicine | Admitting: Internal Medicine

## 2014-09-06 ENCOUNTER — Encounter (HOSPITAL_COMMUNITY): Payer: Self-pay

## 2014-09-06 VITALS — BP 129/54 | HR 62 | Temp 97.7°F | Resp 20 | Ht 71.5 in | Wt 275.6 lb

## 2014-09-06 DIAGNOSIS — D509 Iron deficiency anemia, unspecified: Secondary | ICD-10-CM | POA: Insufficient documentation

## 2014-09-06 LAB — ANTITHROMBIN III: ANTITHROMB III FUNC: 91 % (ref 76–126)

## 2014-09-06 LAB — PROTEIN S ACTIVITY: PROTEIN S ACTIVITY: 85 % (ref 69–129)

## 2014-09-06 LAB — PROTEIN C ACTIVITY: PROTEIN C ACTIVITY: 127 % (ref 75–133)

## 2014-09-06 MED ORDER — SODIUM CHLORIDE 0.9 % IV SOLN
510.0000 mg | INTRAVENOUS | Status: DC
  Administered 2014-09-06: 510 mg via INTRAVENOUS
  Filled 2014-09-06: qty 17

## 2014-09-06 MED ORDER — FERUMOXYTOL INJECTION 510 MG/17 ML: 510.0000 mg | INTRAVENOUS | Status: DC

## 2014-09-06 MED ORDER — SODIUM CHLORIDE 0.9 % IV SOLN
INTRAVENOUS | Status: DC
  Administered 2014-09-06: 13:00:00 via INTRAVENOUS

## 2014-09-06 NOTE — Progress Notes (Signed)
Quick Note:  Agree with anti-coag Tx - end results of thrombosis can be worse than bleeding ______

## 2014-09-06 NOTE — Discharge Instructions (Signed)

## 2014-09-07 ENCOUNTER — Encounter (HOSPITAL_COMMUNITY): Payer: Medicare Other

## 2014-09-07 LAB — PROTEIN S, ANTIGEN, FREE: PROTEIN S AG FREE: 102 %{normal} (ref 57–171)

## 2014-09-08 LAB — PROTHROMBIN GENE MUTATION

## 2014-09-08 LAB — FACTOR 5 LEIDEN

## 2014-09-09 ENCOUNTER — Other Ambulatory Visit (INDEPENDENT_AMBULATORY_CARE_PROVIDER_SITE_OTHER): Payer: Medicare Other

## 2014-09-09 ENCOUNTER — Ambulatory Visit (INDEPENDENT_AMBULATORY_CARE_PROVIDER_SITE_OTHER): Payer: Medicare Other | Admitting: Critical Care Medicine

## 2014-09-09 ENCOUNTER — Ambulatory Visit (INDEPENDENT_AMBULATORY_CARE_PROVIDER_SITE_OTHER): Payer: Medicare Other | Admitting: Pharmacist

## 2014-09-09 ENCOUNTER — Encounter: Payer: Self-pay | Admitting: Critical Care Medicine

## 2014-09-09 ENCOUNTER — Ambulatory Visit (INDEPENDENT_AMBULATORY_CARE_PROVIDER_SITE_OTHER): Payer: Medicare Other | Admitting: *Deleted

## 2014-09-09 ENCOUNTER — Encounter (HOSPITAL_COMMUNITY): Payer: Medicare Other

## 2014-09-09 ENCOUNTER — Telehealth (HOSPITAL_COMMUNITY): Payer: Self-pay | Admitting: *Deleted

## 2014-09-09 VITALS — BP 130/78 | HR 75 | Temp 96.9°F | Ht 71.5 in | Wt 273.0 lb

## 2014-09-09 DIAGNOSIS — Z9861 Coronary angioplasty status: Secondary | ICD-10-CM

## 2014-09-09 DIAGNOSIS — R0609 Other forms of dyspnea: Secondary | ICD-10-CM

## 2014-09-09 DIAGNOSIS — I2510 Atherosclerotic heart disease of native coronary artery without angina pectoris: Secondary | ICD-10-CM

## 2014-09-09 DIAGNOSIS — Z7902 Long term (current) use of antithrombotics/antiplatelets: Secondary | ICD-10-CM

## 2014-09-09 DIAGNOSIS — I81 Portal vein thrombosis: Secondary | ICD-10-CM

## 2014-09-09 DIAGNOSIS — Z23 Encounter for immunization: Secondary | ICD-10-CM

## 2014-09-09 DIAGNOSIS — R0989 Other specified symptoms and signs involving the circulatory and respiratory systems: Principal | ICD-10-CM

## 2014-09-09 LAB — CBC
HCT: 29.4 % — ABNORMAL LOW (ref 39.0–52.0)
HEMOGLOBIN: 9.3 g/dL — AB (ref 13.0–17.0)
MCH: 28.1 pg (ref 26.0–34.0)
MCHC: 31.6 g/dL (ref 30.0–36.0)
MCV: 88.8 fL (ref 78.0–100.0)
Platelets: 218 10*3/uL (ref 150–400)
RBC: 3.31 MIL/uL — AB (ref 4.22–5.81)
RDW: 16.7 % — ABNORMAL HIGH (ref 11.5–15.5)
WBC: 5.3 10*3/uL (ref 4.0–10.5)

## 2014-09-09 LAB — POCT INR: INR: 1.4

## 2014-09-09 MED ORDER — WARFARIN SODIUM 5 MG PO TABS: 5.0000 mg | ORAL_TABLET | Freq: Every day | ORAL | Status: DC

## 2014-09-09 NOTE — Patient Instructions (Signed)
Patient instructed to take medications as defined in the Anti-coagulation Track section of this encounter.  Patient instructed to TAKE today's dose. Patient instructed to CONTINUE Lovenox 120mg  SQ q12h alternating sites every other injection. Lovenox was provided (#6 syringes; 120mg ). Patient instructed to return to clinic or go to the Emergency Department if there were to be accumulation of blood beneath skins surface where self-injecting Lovenox.  Patient verbalized understanding of these instructions.

## 2014-09-09 NOTE — Progress Notes (Signed)
Anti-Coagulation Progress Note  James [name] is a 80 y.o. male who is currently on an anti-coagulation regimen.    RECENT RESULTS: Recent results are below, the most recent result is correlated with a dose of 5mg  PO qd since Monday September 14th as started by Dr. Beryle Beams. He has been on 120mg  SQ q12 of Lovenox as bridge therapy for portal vein thrombosis. He will continue this overlap through Monday September 12, 2014 since INR today is 1.4.We have drawn a CBC to be evaluated. Results will be discussed with patient at his visit on Monday unless results warrant otherwise. He is leaving to attend an appointment at [facility] with Dr. Joya Gaskins.  Lab Results  Component Value Date   INR 1.4 09/09/2014   INR 1.1 09/05/2014   INR 1.0 07/25/2014    ANTI-COAG DOSE: Anticoagulation Dose Instructions as of 09/09/2014     [name] Tue Wed Thu Fri Sat   New Dose 7.5 mg 5 mg 5 mg 5 mg 5 mg 7.5 mg 7.5 mg       ANTICOAG SUMMARY: Anticoagulation Episode Summary   Current INR goal 2.0-3.0  Next INR check 09/12/2014  INR from last check 1.4! (09/09/2014)  Weekly max dose   Target end date Indefinite  [facility]  Preferred lab   Send INR reminders to    Indications  Portal vein thrombosis [452] Encounter for long-term (current) use of antiplatelets/antithrombotics [V58.63]        Comments       Anticoagulation Care Providers   Provider Role Specialty Phone number   Annia Belt, MD  Oncology [phone]      ANTICOAG TODAY: Anticoagulation Summary as of 09/09/2014   INR goal 2.0-3.0  Selected INR 1.4! (09/09/2014)  Next INR check 09/12/2014  Target end date Indefinite   Indications  Portal vein thrombosis [452] Encounter for long-term (current) use of antiplatelets/antithrombotics [V58.63]      Anticoagulation Episode Summary   [facility] check [facility]   Preferred lab    Send INR reminders to    Comments     Anticoagulation Care  Providers   Provider Role Specialty Phone number   Annia Belt, MD  Oncology [phone]      PATIENT INSTRUCTIONS: Patient Instructions  Patient instructed to take medications as defined in the Anti-coagulation Track section of this encounter.  Patient instructed to TAKE today's dose. Patient instructed to CONTINUE Lovenox 120mg  SQ q12h alternating sites every other injection. Lovenox was provided (#6 syringes; 120mg ). Patient instructed to return to clinic or go to the Emergency Department if there were to be accumulation of blood beneath skins surface where self-injecting Lovenox.  Patient verbalized understanding of these instructions.       FOLLOW-UP Return in 3 days (on 09/12/2014) for Follow up INR at 0900h.  [name], III Pharm.D., CACP

## 2014-09-09 NOTE — Progress Notes (Signed)
INTERNAL MEDICINE TEACHING ATTENDING ADDENDUM - Atiba Kimberlin M.D  Duration- indefinite, Indication- portal vein thrombosis, INR- subtherapeutic. Agree with Dr. Gladstone Pih recommendations as outlined in his note.   I personally examined the patient secondary to ecchymosis over R abd wall at the site of lovenox injection. There was no palpable hematoma. Pt advised to come to clinic or ED if worsening ecchymosis/lightheadedness/hematoma at site. Will check CBC today.

## 2014-09-09 NOTE — Progress Notes (Signed)
Subjective:    Patient ID: James [name], male    DOB: 12/01/1928, 80 y.o.   MRN: [id number]  HPI 09/09/2014 Chief Complaint  Patient presents with  . 6 wk follow up    SOB is unchanged.  Cough more persisitant after starting Spiriva Respimat.  Has stopped med for now - cough has improved.  Pt seen by GI. On 4 different blood thinners.  Spiriva just caused cough and no improvement Now off iron. Now on full anticoagulation.  Pt had iron infusion this past week.   On lovenox and coumadin .  Renal function is normal     Review of Systems Constitutional:   No  weight loss, night sweats,  Fevers, chills, fatigue, lassitude. HEENT:   No headaches,  Difficulty swallowing,  Tooth/dental problems,  Sore throat,                No sneezing, itching, ear ache, nasal congestion, post nasal drip,   CV:  No chest pain,  Orthopnea, PND, swelling in lower extremities, anasarca, dizziness, palpitations  GI  No heartburn, indigestion, abdominal pain, nausea, vomiting, diarrhea, change in bowel habits, loss of appetite  Resp: Notes shortness of breath with exertion not  at rest.  No excess mucus, no productive cough,  No non-productive cough,  No coughing up of blood.  No change in color of mucus.  No wheezing.  No chest wall deformity  Skin: no rash or lesions.  GU: no dysuria, change in color of urine, no urgency or frequency.  No flank pain.  MS:  No joint pain or swelling.  No decreased range of motion.  No back pain.  Psych:  No change in mood or affect. No depression or anxiety.  No memory loss.     Objective:   Physical Exam Filed Vitals:   09/09/14 1105  BP: 130/78  Pulse: 75  Temp: 96.9 F (36.1 C)  TempSrc: Oral  Height: 5' 11.5" (1.816 m)  Weight: 273 lb (123.832 kg)  SpO2: 97%    Gen: Pleasant, well-nourished, in no distress,  normal affect  ENT: No lesions,  mouth clear,  oropharynx clear, no postnasal drip  Neck: No JVD, no TMG, no carotid bruits  Lungs: No use of  accessory muscles, no dullness to percussion, clear without rales or rhonchi  Cardiovascular: RRR, heart sounds normal, no murmur or gallops, no peripheral edema  Abdomen: Distended with mild hepatomegaly  Musculoskeletal: No deformities, no cyanosis or clubbing  Neuro: alert, non focal  Skin: Warm, no lesions or rashes  No results found.    08/20/2014 CLINICAL DATA: Evaluate liver lesion noted on recent CT examination. Cirrhosis. EXAM: MRI ABDOMEN WITHOUT AND WITH CONTRAST TECHNIQUE: Multiplanar multisequence MR imaging of the abdomen was performed both before and after the administration of intravenous contrast. CONTRAST: 10 mL of Eovist. COMPARISON: CT of the abdomen and pelvis 08/11/2014. FINDINGS: Study is somewhat limited by extensive patient respiratory motion, particularly on the early arterial phase postcontrast imaging. In the central aspect of segment 7 of the liver there is a well-circumscribed 2.6 cm lesion which is low signal intensity on T1 weighted images, high signal intensity on T2 weighted images, and demonstrates some peripheral nodular enhancement on post gadolinium imaging which subsequently becomes uniform signal intensity with the remainder of the blood pool on delayed imaging, compatible with a cavernous hemangioma. There is an unusual appearance of the posterior branches of the right portal vein, involving all of the distribution in segment 6,  and much of the distribution of segments 7, throughout which the branches of the portal vein appear ectatic or frankly aneurysmal (particularly aneurysmal in the periphery of segment 6). Notably, on precontrast T2 weighted images there is no signal void throughout the portal vein (generally mildly hyperintense T2 signal intensity), and on white blood images (true FISP) there is relatively low signal intensity throughout the portal vein, particularly in the right lobe of the liver, suggesting either very sluggish flow or complete  occlusion. On post-contrast images there is an apparent filling defect at the level of the splenoportal confluence extending into the proximal main portal vein, which appears dilated measuring up to 19 mm in the porta hepatis. The remainder of the portal vein demonstrates relatively low signal intensity (much less than other vascular structures), concerning for very sluggish flow or potential chronic occlusion. On 20 minute delayed post-contrast images, the aneurysmal dilatation of portal vein branches throughout segment 7, and in particular segment 6 of the liver is very apparent. Trace volume of perihepatic ascites, best appreciated on the coronal T2 weighted sequences. The appearance of the gallbladder, spleen, bilateral adrenal glands and bilateral kidneys is unremarkable. Mild fatty atrophy in the pancreas. There are all some multifocal small lesions that are T1 hypo intense and T2 hyperintense throughout the pancreatic parenchyma which demonstrate no definite enhancement on post gadolinium images, favored to represent multiple tiny pseudocysts. IMPRESSION: 1. Highly unusual appearance of the posterior segments of the right lobe of the liver, most severe in segment 6. Specifically, throughout these regions there is aneurysmal dilatation of the portal vein branches, particularly peripherally in segment 6 of the liver. There appears to be abnormal blood flow starting at the level the splenoportal confluence throughout much of the portal vein and distal branches, which could suggest either very sluggish flow or chronic occlusion. Further evaluation with duplex ultrasound of the liver is suggested at this time. 2. There is also a small lesion in the central aspect of segment 8 of the liver which has imaging characteristics compatible with a cavernous hemangioma. 3. Trace volume of perihepatic ascites. 4. No definite morphologic changes in the liver to strongly suggest cirrhosis at this time. 5. 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 Attention on a followup MRI of the abdomen with and without IV gadolinium in 1 year is suggested to confirm stability or resolution of these lesions. Electronically Signed By: Vinnie Langton M.D. On: 08/20/2014 15:16  US Abdomen Limited  08/26/2014 CLINICAL DATA: Abnormal portal vein on MRI EXAM: DUPLEX ULTRASOUND OF LIVER TECHNIQUE: Color and duplex Doppler ultrasound was performed to evaluate the hepatic in-flow and out-flow vessels. COMPARISON: MRI of the abdomen 08/20/2014; CT abdomen 08/11/2014 FINDINGS: Portal Vein Velocities Main: Occluded Right: Occluded Left: Occluded Hepatic Vein Velocities Right: 37 cm/sec Middle: 22 cm/sec Left: 24 cm/sec Hepatic Artery Velocity: 108 cm/sec Splenic Vein Velocity: 17 cm/sec Varices: None Ascites: Trace perihepatic ascites Gallbladder: Numerous tiny echogenic foci throughout the gallbladder consistent with sludge versus small stones. No gallbladder wall thickening or pericholecystic fluid. Per the sonographer, the sonographic Percell Miller sign is negative. Common bile duct: Within normal limits at 6 mm Liver: 2.1 x 2.7 x 2.0 cm circumscribed hyperechoic mass in the right hepatic lobe corresponds with a hemangioma seen on the recent cross-sectional imaging studies. IMPRESSION: 1. Complete occlusion of the main, right and left portal veins. The main portal vein is expanded  with low-level internal echoes suggestive of acute thrombus. 2. 2.7 cm hemangioma in the right hemi liver. 3. Gallbladder sludge and/or small stones. No secondary sonographic findings to suggest acute cholecystitis. 4. Trace perihepatic ascites may be related to acute/subacute portal venous thrombosis. 5. The splenic vein remains patent. Signed, Criselda Peaches, MD [facility] Electronically  Signed By: Jacqulynn Cadet M.D. On: 08/26/2014 14:37  [place] Art/ven Flow Abd Pelv Doppler  08/26/2014 CLINICAL DATA: Abnormal portal vein on MRI EXAM: DUPLEX ULTRASOUND OF LIVER TECHNIQUE: Color and duplex Doppler ultrasound was performed to evaluate the hepatic in-flow and out-flow vessels. COMPARISON: MRI of the abdomen 08/20/2014; CT abdomen 08/11/2014 FINDINGS: Portal Vein Velocities Main: Occluded Right: Occluded Left: Occluded Hepatic Vein Velocities Right: 37 cm/sec Middle: 22 cm/sec Left: 24 cm/sec Hepatic Artery Velocity: 108 cm/sec Splenic Vein Velocity: 17 cm/sec Varices: None Ascites: Trace perihepatic ascites Gallbladder: Numerous tiny echogenic foci throughout the gallbladder consistent with sludge versus small stones. No gallbladder wall thickening or pericholecystic fluid. Per the sonographer, the sonographic Percell Miller sign is negative. Common bile duct: Within normal limits at 6 mm Liver: 2.1 x 2.7 x 2.0 cm circumscribed hyperechoic mass in the right hepatic lobe corresponds with a hemangioma seen on the recent cross-sectional imaging studies. IMPRESSION: 1. Complete occlusion of the main, right and left portal veins. The main portal vein is expanded with low-level internal echoes suggestive of acute thrombus. 2. 2.7 cm hemangioma in the right hemi liver. 3. Gallbladder sludge and/or small stones. No secondary sonographic findings to suggest acute cholecystitis. 4. Trace perihepatic ascites may be related to acute/subacute portal venous thrombosis. 5. The splenic vein remains patent. Signed, Criselda Peaches, MD [facility] Radiology Electronically Signed By: Jacqulynn Cadet M.D. On: 08/26/2014 14:37      Assessment & Plan:   DYSPNEA ON EXERTION Dyspnea on exertion with normal spirometry but evidence for restriction Centrilobular emphysema on CT scanning No response to Spiriva therapy History of anemia on the basis of chronic blood loss Portal  vein thrombosis with distended abdomen and esophageal and gastric varices now on anticoagulant therapy Plan Maintain off Spiriva for now Treatment of portal vein thrombosis per hematology and gastroenterology   Updated Medication List Outpatient Encounter Prescriptions as of 09/09/2014  Medication Sig  . allopurinol (ZYLOPRIM) 300 MG tablet Take 300 mg by mouth daily.  . Cholecalciferol (VITAMIN D3) 2000 UNITS capsule Take 2,000 Units by mouth daily.    Marland Kitchen enoxaparin (LOVENOX) 120 MG/0.8ML injection Inject 120 mg into the skin every 12 (twelve) hours.  . furosemide (LASIX) 40 MG tablet Take 80 mg by mouth daily.  . Misc Natural Products (OSTEO BI-FLEX JOINT SHIELD PO) Take 1 tablet by mouth daily.   . Multiple Vitamin (MULTIVITAMIN) tablet Take 1 tablet by mouth daily.    . nitroGLYCERIN (NITROSTAT) 0.4 MG SL tablet Place 1 tablet (0.4 mg total) under the tongue every 5 (five) minutes as needed. For chest pain  . pantoprazole (PROTONIX) 40 MG tablet Take 1 tablet (40 mg total) by mouth daily.  . Pitavastatin Calcium (LIVALO) 4 MG TABS Take 4 mg by mouth at bedtime.  . potassium chloride SA (KLOR-CON M20) 20 MEQ tablet Take 1 tablet (20 mEq total) by mouth daily.  . Probiotic Product (PROBIOTIC & ACIDOPHILUS EX ST PO) Take 1 tablet by mouth daily.  . Psyllium (METAMUCIL PO) Take 20 mLs by mouth every evening.  Marland Kitchen telmisartan (MICARDIS) 40 MG tablet Take 40 mg  by mouth daily.  Marland Kitchen warfarin (COUMADIN) 5 MG tablet Take 1 tablet (5 mg total) by mouth daily.  . [DISCONTINUED] aspirin 81 MG chewable tablet Chew 1 tablet (81 mg total) by mouth daily.  . [DISCONTINUED] Iron Polysacch Cmplx-B12-FA 150-0.025-1 MG CAPS Take 1 capsule by mouth 2 (two) times daily.  . [DISCONTINUED] Tiotropium Bromide Monohydrate (SPIRIVA RESPIMAT) 2.5 MCG/ACT AERS Inhale 2 puffs into the lungs daily.

## 2014-09-09 NOTE — Telephone Encounter (Signed)
Khing Belcher, Please call Mr Sobel and ask him to stop aspirin but continue Plavix now that he is going on coumadin. Thanks.   Per Dr Aundra Dubin, spoke w/pt's daughter and she is aware and agreeable and will make sure ASA is stopped

## 2014-09-09 NOTE — Patient Instructions (Signed)
Stop spiriva Continue therapy per dr Beryle Beams and Carlean Purl Return to pulmonary as needed

## 2014-09-10 NOTE — Assessment & Plan Note (Signed)
Dyspnea on exertion with normal spirometry but evidence for restriction Centrilobular emphysema on CT scanning No response to Spiriva therapy History of anemia on the basis of chronic blood loss Portal vein thrombosis with distended abdomen and esophageal and gastric varices now on anticoagulant therapy Plan Maintain off Spiriva for now Treatment of portal vein thrombosis per hematology and gastroenterology

## 2014-09-12 ENCOUNTER — Encounter (HOSPITAL_COMMUNITY): Payer: Medicare Other

## 2014-09-12 ENCOUNTER — Ambulatory Visit (INDEPENDENT_AMBULATORY_CARE_PROVIDER_SITE_OTHER): Payer: Medicare Other | Admitting: Pharmacist

## 2014-09-12 DIAGNOSIS — Z7902 Long term (current) use of antithrombotics/antiplatelets: Secondary | ICD-10-CM

## 2014-09-12 DIAGNOSIS — I81 Portal vein thrombosis: Secondary | ICD-10-CM

## 2014-09-12 LAB — POCT INR: INR: 2.4

## 2014-09-12 NOTE — Progress Notes (Signed)
Anti-Coagulation Progress Note  James [name] is a 79 y.o. male who is currently on an anti-coagulation regimen.    RECENT RESULTS: Recent results are below, the most recent result is correlated with a dose of 47.5 mg. per week with concomitant Lovenox. Lovenox is now being discontinued: Lab Results  Component Value Date   INR 2.40 09/12/2014   INR 1.4 09/09/2014   INR 1.1 09/05/2014    ANTI-COAG DOSE: Anticoagulation Dose Instructions as of 09/12/2014     [name] Tue Wed Thu Fri Sat   New Dose 5 mg 7.5 mg 5 mg 5 mg 7.5 mg 5 mg 5 mg       ANTICOAG SUMMARY: Anticoagulation Episode Summary   Current INR goal 2.0-3.0  Next INR check 09/26/2014  INR from last check 2.40 (09/12/2014)  Weekly max dose   Target end date Indefinite  [facility]  Preferred lab   Send INR reminders to    Indications  Portal vein thrombosis [452] Encounter for long-term (current) use of antiplatelets/antithrombotics [V58.63]        Comments       Anticoagulation Care Providers   Provider Role Specialty Phone number   Annia Belt, MD  Oncology [phone]      ANTICOAG TODAY: Anticoagulation Summary as of 09/12/2014   INR goal 2.0-3.0  Selected INR 2.40 (09/12/2014)  Next INR check 09/26/2014  Target end date Indefinite   Indications  Portal vein thrombosis [452] Encounter for long-term (current) use of antiplatelets/antithrombotics [V58.63]      Anticoagulation Episode Summary   [facility] check [facility]   Preferred lab    Send INR reminders to    Comments     Anticoagulation Care Providers   Provider Role Specialty Phone number   Annia Belt, MD  Oncology [phone]      PATIENT INSTRUCTIONS: Patient Instructions  Patient instructed to take medications as defined in the Anti-coagulation Track section of this encounter.  Patient instructed to take today's dose. Patient instructed to DISCONTINUE the Lovenox injections. Patient  verbalized understanding of these instructions.       FOLLOW-UP Return in 2 weeks (on 09/26/2014) for Follow up INR at 0945h.  [name], III Pharm.D., CACP

## 2014-09-12 NOTE — Progress Notes (Signed)
INTERNAL MEDICINE TEACHING ATTENDING ADDENDUM - Aldine Contes M.D  Duration- indefinite, Indication- portal vein thrombosis, INR- therapeutic. Agree with Dr. Gladstone Pih recommendations as outlined in his note.

## 2014-09-12 NOTE — Patient Instructions (Signed)
Patient instructed to take medications as defined in the Anti-coagulation Track section of this encounter.  Patient instructed to take today's dose. Patient instructed to DISCONTINUE the Lovenox injections. Patient verbalized understanding of these instructions.

## 2014-09-13 ENCOUNTER — Encounter (HOSPITAL_COMMUNITY)
Admission: RE | Admit: 2014-09-13 | Discharge: 2014-09-13 | Disposition: A | Discharge status: Home / Self Care | Payer: Medicare Other | Source: Ambulatory Visit | Attending: Internal Medicine | Admitting: Internal Medicine

## 2014-09-13 ENCOUNTER — Encounter (HOSPITAL_COMMUNITY): Payer: Self-pay

## 2014-09-13 DIAGNOSIS — D509 Iron deficiency anemia, unspecified: Secondary | ICD-10-CM | POA: Diagnosis not present

## 2014-09-13 MED ORDER — SODIUM CHLORIDE 0.9 % IV SOLN
INTRAVENOUS | Status: DC
  Administered 2014-09-13: 250 mL via INTRAVENOUS

## 2014-09-13 MED ORDER — SODIUM CHLORIDE 0.9 % IV SOLN
510.0000 mg | INTRAVENOUS | Status: AC
  Administered 2014-09-13: 510 mg via INTRAVENOUS
  Filled 2014-09-13: qty 17

## 2014-09-14 ENCOUNTER — Encounter (HOSPITAL_COMMUNITY): Payer: Medicare Other

## 2014-09-16 ENCOUNTER — Encounter (HOSPITAL_COMMUNITY): Payer: Medicare Other

## 2014-09-17 ENCOUNTER — Other Ambulatory Visit: Payer: Self-pay | Admitting: Cardiology

## 2014-09-18 ENCOUNTER — Encounter (HOSPITAL_COMMUNITY): Payer: Self-pay | Admitting: Emergency Medicine

## 2014-09-18 ENCOUNTER — Inpatient Hospital Stay (HOSPITAL_COMMUNITY)
Admission: EM | Admit: 2014-09-18 | Discharge: 2014-09-25 | DRG: 378 | Disposition: A | Discharge status: Home / Self Care | Payer: Medicare Other | Attending: Internal Medicine | Admitting: Internal Medicine

## 2014-09-18 DIAGNOSIS — D509 Iron deficiency anemia, unspecified: Secondary | ICD-10-CM | POA: Diagnosis present

## 2014-09-18 DIAGNOSIS — K766 Portal hypertension: Secondary | ICD-10-CM | POA: Diagnosis present

## 2014-09-18 DIAGNOSIS — K921 Melena: Secondary | ICD-10-CM | POA: Diagnosis present

## 2014-09-18 DIAGNOSIS — K625 Hemorrhage of anus and rectum: Secondary | ICD-10-CM | POA: Diagnosis present

## 2014-09-18 DIAGNOSIS — Z87891 Personal history of nicotine dependence: Secondary | ICD-10-CM

## 2014-09-18 DIAGNOSIS — Z886 Allergy status to analgesic agent status: Secondary | ICD-10-CM | POA: Diagnosis not present

## 2014-09-18 DIAGNOSIS — R269 Unspecified abnormalities of gait and mobility: Secondary | ICD-10-CM

## 2014-09-18 DIAGNOSIS — K644 Residual hemorrhoidal skin tags: Secondary | ICD-10-CM | POA: Diagnosis present

## 2014-09-18 DIAGNOSIS — R188 Other ascites: Secondary | ICD-10-CM | POA: Diagnosis present

## 2014-09-18 DIAGNOSIS — Z882 Allergy status to sulfonamides status: Secondary | ICD-10-CM

## 2014-09-18 DIAGNOSIS — K5900 Constipation, unspecified: Secondary | ICD-10-CM | POA: Diagnosis not present

## 2014-09-18 DIAGNOSIS — K922 Gastrointestinal hemorrhage, unspecified: Secondary | ICD-10-CM | POA: Diagnosis present

## 2014-09-18 DIAGNOSIS — Z7901 Long term (current) use of anticoagulants: Secondary | ICD-10-CM | POA: Diagnosis not present

## 2014-09-18 DIAGNOSIS — M545 Low back pain: Secondary | ICD-10-CM | POA: Diagnosis present

## 2014-09-18 DIAGNOSIS — Z86718 Personal history of other venous thrombosis and embolism: Secondary | ICD-10-CM | POA: Diagnosis not present

## 2014-09-18 DIAGNOSIS — Z96652 Presence of left artificial knee joint: Secondary | ICD-10-CM | POA: Diagnosis present

## 2014-09-18 DIAGNOSIS — Z9861 Coronary angioplasty status: Secondary | ICD-10-CM

## 2014-09-18 DIAGNOSIS — D126 Benign neoplasm of colon, unspecified: Secondary | ICD-10-CM | POA: Diagnosis not present

## 2014-09-18 DIAGNOSIS — E669 Obesity, unspecified: Secondary | ICD-10-CM | POA: Diagnosis present

## 2014-09-18 DIAGNOSIS — K3189 Other diseases of stomach and duodenum: Secondary | ICD-10-CM | POA: Diagnosis present

## 2014-09-18 DIAGNOSIS — D62 Acute posthemorrhagic anemia: Secondary | ICD-10-CM | POA: Diagnosis present

## 2014-09-18 DIAGNOSIS — Z800 Family history of malignant neoplasm of digestive organs: Secondary | ICD-10-CM | POA: Diagnosis not present

## 2014-09-18 DIAGNOSIS — I10 Essential (primary) hypertension: Secondary | ICD-10-CM | POA: Diagnosis present

## 2014-09-18 DIAGNOSIS — I8500 Esophageal varices without bleeding: Secondary | ICD-10-CM | POA: Diagnosis present

## 2014-09-18 DIAGNOSIS — G8929 Other chronic pain: Secondary | ICD-10-CM | POA: Diagnosis present

## 2014-09-18 DIAGNOSIS — Z85820 Personal history of malignant melanoma of skin: Secondary | ICD-10-CM

## 2014-09-18 DIAGNOSIS — Z808 Family history of malignant neoplasm of other organs or systems: Secondary | ICD-10-CM | POA: Diagnosis not present

## 2014-09-18 DIAGNOSIS — Z880 Allergy status to penicillin: Secondary | ICD-10-CM

## 2014-09-18 DIAGNOSIS — Z823 Family history of stroke: Secondary | ICD-10-CM | POA: Diagnosis not present

## 2014-09-18 DIAGNOSIS — Z8249 Family history of ischemic heart disease and other diseases of the circulatory system: Secondary | ICD-10-CM

## 2014-09-18 DIAGNOSIS — Z6836 Body mass index (BMI) 36.0-36.9, adult: Secondary | ICD-10-CM

## 2014-09-18 DIAGNOSIS — Z889 Allergy status to unspecified drugs, medicaments and biological substances status: Secondary | ICD-10-CM | POA: Diagnosis not present

## 2014-09-18 DIAGNOSIS — Z7902 Long term (current) use of antithrombotics/antiplatelets: Secondary | ICD-10-CM

## 2014-09-18 DIAGNOSIS — Z833 Family history of diabetes mellitus: Secondary | ICD-10-CM | POA: Diagnosis not present

## 2014-09-18 DIAGNOSIS — D122 Benign neoplasm of ascending colon: Secondary | ICD-10-CM | POA: Diagnosis present

## 2014-09-18 DIAGNOSIS — I2510 Atherosclerotic heart disease of native coronary artery without angina pectoris: Secondary | ICD-10-CM | POA: Diagnosis present

## 2014-09-18 DIAGNOSIS — E785 Hyperlipidemia, unspecified: Secondary | ICD-10-CM | POA: Diagnosis present

## 2014-09-18 DIAGNOSIS — I5032 Chronic diastolic (congestive) heart failure: Secondary | ICD-10-CM | POA: Diagnosis present

## 2014-09-18 DIAGNOSIS — I81 Portal vein thrombosis: Secondary | ICD-10-CM

## 2014-09-18 DIAGNOSIS — G629 Polyneuropathy, unspecified: Secondary | ICD-10-CM

## 2014-09-18 HISTORY — DX: Portal vein thrombosis: I81

## 2014-09-18 LAB — CBC WITH DIFFERENTIAL/PLATELET
Basophils Absolute: 0 10*3/uL (ref 0.0–0.1)
Basophils Relative: 1 % (ref 0–1)
Eosinophils Absolute: 0.1 10*3/uL (ref 0.0–0.7)
Eosinophils Relative: 2 % (ref 0–5)
HEMATOCRIT: 29.5 % — AB (ref 39.0–52.0)
HEMOGLOBIN: 9.3 g/dL — AB (ref 13.0–17.0)
LYMPHS ABS: 1.6 10*3/uL (ref 0.7–4.0)
Lymphocytes Relative: 27 % (ref 12–46)
MCH: 29.2 pg (ref 26.0–34.0)
MCHC: 31.5 g/dL (ref 30.0–36.0)
MCV: 92.5 fL (ref 78.0–100.0)
MONO ABS: 0.7 10*3/uL (ref 0.1–1.0)
MONOS PCT: 12 % (ref 3–12)
NEUTROS ABS: 3.6 10*3/uL (ref 1.7–7.7)
Neutrophils Relative %: 58 % (ref 43–77)
Platelets: 200 10*3/uL (ref 150–400)
RBC: 3.19 MIL/uL — AB (ref 4.22–5.81)
RDW: 20.3 % — ABNORMAL HIGH (ref 11.5–15.5)
WBC: 6.1 10*3/uL (ref 4.0–10.5)

## 2014-09-18 LAB — COMPREHENSIVE METABOLIC PANEL
ALK PHOS: 97 U/L (ref 39–117)
ALT: 39 U/L (ref 0–53)
AST: 24 U/L (ref 0–37)
Albumin: 3.5 g/dL (ref 3.5–5.2)
Anion gap: 13 (ref 5–15)
BUN: 17 mg/dL (ref 6–23)
CHLORIDE: 107 meq/L (ref 96–112)
CO2: 22 meq/L (ref 19–32)
Calcium: 9.3 mg/dL (ref 8.4–10.5)
Creatinine, Ser: 1.05 mg/dL (ref 0.50–1.35)
GFR, EST AFRICAN AMERICAN: 73 mL/min — AB (ref >90)
GFR, EST NON AFRICAN AMERICAN: 63 mL/min — AB (ref >90)
GLUCOSE: 104 mg/dL — AB (ref 70–99)
POTASSIUM: 4.4 meq/L (ref 3.7–5.3)
Sodium: 142 mEq/L (ref 137–147)
Total Bilirubin: 0.3 mg/dL (ref 0.3–1.2)
Total Protein: 6.6 g/dL (ref 6.0–8.3)

## 2014-09-18 LAB — HEMOGLOBIN AND HEMATOCRIT, BLOOD
HCT: 27 % — ABNORMAL LOW (ref 39.0–52.0)
Hemoglobin: 8.6 g/dL — ABNORMAL LOW (ref 13.0–17.0)

## 2014-09-18 LAB — APTT: aPTT: 48 seconds — ABNORMAL HIGH (ref 24–37)

## 2014-09-18 LAB — I-STAT TROPONIN, ED: TROPONIN I, POC: 0 ng/mL (ref 0.00–0.08)

## 2014-09-18 LAB — TYPE AND SCREEN
ABO/RH(D): A NEG
Antibody Screen: NEGATIVE

## 2014-09-18 LAB — PROTIME-INR
INR: 2.65 — ABNORMAL HIGH (ref 0.00–1.49)
Prothrombin Time: 28.3 seconds — ABNORMAL HIGH (ref 11.6–15.2)

## 2014-09-18 MED ORDER — FUROSEMIDE 80 MG PO TABS
80.0000 mg | ORAL_TABLET | ORAL | Status: DC
  Administered 2014-09-19: 80 mg via ORAL
  Filled 2014-09-18 (×2): qty 1

## 2014-09-18 MED ORDER — ACETAMINOPHEN 325 MG PO TABS: 650.0000 mg | ORAL_TABLET | Freq: Four times a day (QID) | ORAL | Status: DC | PRN

## 2014-09-18 MED ORDER — NITROGLYCERIN 0.4 MG SL SUBL: 0.4000 mg | SUBLINGUAL_TABLET | SUBLINGUAL | Status: DC | PRN

## 2014-09-18 MED ORDER — ALLOPURINOL 300 MG PO TABS
300.0000 mg | ORAL_TABLET | Freq: Every day | ORAL | Status: DC
  Administered 2014-09-19 – 2014-09-25 (×6): 300 mg via ORAL
  Filled 2014-09-18 (×9): qty 1

## 2014-09-18 MED ORDER — PANTOPRAZOLE SODIUM 40 MG PO TBEC
40.0000 mg | DELAYED_RELEASE_TABLET | Freq: Every day | ORAL | Status: DC
  Administered 2014-09-18 – 2014-09-24 (×7): 40 mg via ORAL
  Filled 2014-09-18 (×7): qty 1

## 2014-09-18 MED ORDER — ONDANSETRON HCL 4 MG PO TABS: 4.0000 mg | ORAL_TABLET | Freq: Four times a day (QID) | ORAL | Status: DC | PRN

## 2014-09-18 MED ORDER — ONDANSETRON HCL 4 MG/2ML IJ SOLN: 4.0000 mg | Freq: Four times a day (QID) | INTRAMUSCULAR | Status: DC | PRN

## 2014-09-18 MED ORDER — CLOPIDOGREL BISULFATE 75 MG PO TABS
75.0000 mg | ORAL_TABLET | ORAL | Status: DC
  Administered 2014-09-19 – 2014-09-21 (×3): 75 mg via ORAL
  Filled 2014-09-18 (×5): qty 1

## 2014-09-18 MED ORDER — ACETAMINOPHEN 650 MG RE SUPP: 650.0000 mg | Freq: Four times a day (QID) | RECTAL | Status: DC | PRN

## 2014-09-18 MED ORDER — POTASSIUM CHLORIDE CRYS ER 20 MEQ PO TBCR
20.0000 meq | EXTENDED_RELEASE_TABLET | Freq: Every day | ORAL | Status: DC
  Administered 2014-09-18: 20 meq via ORAL
  Filled 2014-09-18: qty 1

## 2014-09-18 MED ORDER — SODIUM CHLORIDE 0.9 % IV SOLN: INTRAVENOUS | Status: DC

## 2014-09-18 MED ORDER — SODIUM CHLORIDE 0.9 % IJ SOLN
3.0000 mL | Freq: Two times a day (BID) | INTRAMUSCULAR | Status: DC
  Administered 2014-09-18 – 2014-09-24 (×10): 3 mL via INTRAVENOUS
  Filled 2014-09-18: qty 3

## 2014-09-18 NOTE — ED Notes (Signed)
Patient up ambulated to the bathroom without difficulty.  Regular hospital bed placed in the room.  Patient ambulated around the nurses station, was slightly SOB when returned to the room  Placed on the monitor

## 2014-09-18 NOTE — H&P (Addendum)
[facility] History and Physical  DAMION KANT GQQ:[id number] DOB: 01-May-1928 [name]: 09/18/2014  Referring physician: EDP PCP: [name], MD   Chief Complaint: rectal bleeding  HPI: James [name] is a 80 y.o. male  With h/o DVT, portal vein thrombosis, iron deficiency anemia, CAD with DES 7/15 on warfarin and plavix presents with bloody stool. Once, small amount yesterday. Once today, larger. Bloody. No abdominal pain. No n/v. Per EDP, dark red stool on rectal exam.  Blood pressure stable. hgb 9.3, unchanged from previous. No f/c. Does feel weak with standing. Patient had EGD showing esophageal varices, colonoscopy showing polyps a month ago by Dr. Carlean Purl. EDP consulted Dr. Hilarie Fredrickson.     Review of Systems:    Past Medical History  Diagnosis Date  . Bradycardia     Hypertensive  . carotid bruit, right   . hypercholesterolemia     Trig 234/293;takes Simcor daily  . Obesity, morbid   . Vasculitis   . 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 Beryle Beams; Dr. Beryle Beams , normal platelets  . Dermatitis     legs  . Bleeding gums   . Chronic low back pain   . Myalgia and myositis   . Unspecified vitamin D deficiency   . Bladder diverticulum   . Primary osteoarthritis of both knees   . Nephrolithiasis   . History of elevated glucose   . Thrombophlebitis     right forearm  . History of BPH   . History of colonic polyps     [name]  . Epididymitis, left     S/P Epidimectomy  . Splenomegaly 05/15/09    abd U/S NML  . Hypertension, benign     takes Micardis daily  . Neuropathy     acute  . Bruises easily   . H/O hiatal hernia   . GERD (gastroesophageal reflux disease)     hx of but doesn't take any meds now  . Urinary frequency   . Urinary urgency   . Nocturia   . Urinary leakage   . Abnormality of gait 03/19/2013  . DVT of lower limb, acute 11/19/2013    Peroneal & distal tibial left 11/01/13  . Polio 1941    "mild  case"  . CHF (congestive heart failure)     "low grade" (06/22/2014)  . DVT (deep venous thrombosis) 10/2013    LLE  . Pneumonia, bacterial     RML resolved, spirometry, normal  . Pneumonia 1933  . History of blood transfusion 05/2014  . Anemia 05/2014    "related to Xarelto"  . Iron deficiency anemia 05/2014    "got iron infusion"  . Arthritis     "all over"  . Gout   . Malignant melanoma of ear     right  . Thrombophlebitis of superficial veins of right lower extremity 06/23/2014    Incidental finding doppler 05/30/14  . Shortness of breath   . Esophageal varices without mention of bleeding 08/08/2014   Past Surgical History  Procedure Laterality Date  . Cystoscopy w/ stone manipulation  1950's  . Fem cutaneous nerve entrapment  1977    due to surgery (mild lat anesth of bilat legs)  . Limited pelvis/hip bone scan  04/99    negative  . Bone scan  04/00  . Cataract extraction w/ intraocular lens  implant, bilateral Bilateral ~ 2004  . Melanoma excision Right 06/04    ear; Wide local excision,,with flap  . Stress  cardiolite  05/11/04    mild inf. ischemia, EF 71%  . Shoulder surgery Left 06/17/2005    "tore it all to pieces when I fell; not fractured"  . Bronchoscopy  09/08    RML collapse with chronic pneumonia, bx neg (Dr. Joya Gaskins)  . Cyst excision Right 03/07/09    middle finger, DIP Joint Mucoid (Dr. Fredna Dow)  . Eyelid & eyebrow lift  1996  . Cyst excision Right 08/15/09    middle finger, DIP Joint Mucoid Cyst (Dr. Fredna Dow)  . Appendectomy  1950  . Excision of mucoid tumor    . Colonoscopy    . Total knee arthroplasty  01/10/2012    Procedure: TOTAL KNEE ARTHROPLASTY;  Surgeon: Augustin Schooling, MD;  Location: [facility];  Service: Orthopedics;  Laterality: Left;  . Surgery scrotal / testicular Left ~ 2000    removal of mass, noncancerous  . Cardiac catheterization  05/23/04    mild plague-statin  . Coronary angioplasty with stent placement  06/22/2014    "1"  . Inguinal hernia  repair Bilateral 1959  . Inguinal hernia repair Left 1970's  . Esophagogastroduodenoscopy N/A 08/08/2014    Procedure: ESOPHAGOGASTRODUODENOSCOPY (EGD);  Surgeon: Gatha Mayer, MD;  Location: [facility] ENDOSCOPY;  Service: Endoscopy;  Laterality: N/A;  . Colonoscopy N/A 08/08/2014    Procedure: COLONOSCOPY;  Surgeon: Gatha Mayer, MD;  Location: [facility];  Service: Endoscopy;  Laterality: N/A;   Social History:  reports that he quit smoking about 47 years ago. His smoking use included Cigarettes and Cigars. He has a 40 pack-year smoking history. He has never used smokeless tobacco. He reports that he does not drink alcohol or use illicit drugs.  Allergies  Allergen Reactions  . Aleve [Naproxen Sodium] Other (See Comments)    Causes platlet drop  . Rofecoxib Other (See Comments)    Pt doesn't remember  . Sulfa Antibiotics Other (See Comments)    Pt doesn't remember  . Sulfamethoxazole-Trimethoprim Other (See Comments)     Causes low platelets and bleeding  . Tramadol Hcl Other (See Comments)    dizziness, drowsiness  . Neomycin-Bacitracin Zn-Polymyx Rash  . Neosporin [Neomycin-Polymyxin-Gramicidin] Rash  . Penicillins Swelling and Rash    Family History  Problem Relation Age of Onset  . Thyroid cancer Mother   . Diabetes Father   . Heart disease Father   . Stroke Brother     cerebral hemm  . Colon cancer Brother   . Anesthesia problems Neg Hx   . Hypotension Neg Hx   . Malignant hyperthermia Neg Hx   . Pseudochol deficiency Neg Hx   . Prostate cancer Neg Hx   . Heart attack Father      Prior to Admission medications   Medication Sig Start Date End Date Taking? Authorizing Provider  allopurinol (ZYLOPRIM) 300 MG tablet Take 300 mg by mouth every morning.    Yes Historical Provider, MD  Cholecalciferol (VITAMIN D3) 2000 UNITS capsule Take 2,000 Units by mouth every morning.    Yes Historical Provider, MD  clopidogrel (PLAVIX) 75 MG tablet Take 75 mg by mouth every morning.     Yes Historical Provider, MD  furosemide (LASIX) 40 MG tablet Take 80 mg by mouth every morning.    Yes Historical Provider, MD  Misc Natural Products (OSTEO BI-FLEX JOINT SHIELD PO) Take 1 tablet by mouth every morning.    Yes Historical Provider, MD  Multiple Vitamin (MULTIVITAMIN) tablet Take 1 tablet by mouth every morning.  Yes Historical Provider, MD  pantoprazole (PROTONIX) 40 MG tablet Take 40 mg by mouth at bedtime.   Yes Historical Provider, MD  Pitavastatin Calcium (LIVALO) 4 MG TABS Take 4 mg by mouth at bedtime.   Yes Historical Provider, MD  potassium chloride SA (K-DUR,KLOR-CON) 20 MEQ tablet Take 20 mEq by mouth at bedtime.   Yes Historical Provider, MD  Probiotic Product (PROBIOTIC & ACIDOPHILUS EX ST PO) Take 1 tablet by mouth every morning.    Yes Historical Provider, MD  Psyllium (METAMUCIL PO) Take 20 mLs by mouth every evening.   Yes Historical Provider, MD  ranitidine (ZANTAC) 150 MG tablet Take 150 mg by mouth at bedtime.   Yes Historical Provider, MD  telmisartan (MICARDIS) 40 MG tablet Take 40 mg by mouth every morning.    Yes Historical Provider, MD  warfarin (COUMADIN) 5 MG tablet Take 5-7.5 mg by mouth daily. Take 7.5 mg on Mondays and Thursdays. Take 5 mg on all other days.   Yes Historical Provider, MD  nitroGLYCERIN (NITROSTAT) 0.4 MG SL tablet Place 1 tablet (0.4 mg total) under the tongue every 5 (five) minutes as needed. For chest pain 07/07/14   Larey Dresser, MD   Physical Exam: Filed Vitals:   09/18/14 1700 09/18/14 1715 09/18/14 1730 09/18/14 1800  BP: 109/71 113/38 101/47 133/72  Pulse: 64 72 60 90  Temp:      TempSrc:      Resp: 14 19 17 19   SpO2: 100% 96% 100% 96%    Wt Readings from Last 3 Encounters:  09/13/14 123.832 kg (273 lb)  09/09/14 123.832 kg (273 lb)  09/06/14 125.011 kg (275 lb 9.6 oz)    BP 133/72  Pulse 90  Temp(Src) 97.5 F (36.4 C) (Oral)  Resp 19  SpO2 96%  General Appearance:    Alert, cooperative, no distress,  appears stated age  Head:    Normocephalic, without obvious abnormality, atraumatic  Eyes:    PERRL, conjunctiva/corneas clear, EOM's intact,      Nose:   Nares normal, septum midline, mucosa normal, no drainage   or sinus tenderness  Throat:   Lips, mucosa, and tongue normal; teeth and gums normal  Neck:   Supple, symmetrical, trachea midline, no adenopathy;       thyroid:  No enlargement/tenderness/nodules; no carotid   bruit or JVD  Back:     Symmetric, no curvature, ROM normal, no CVA tenderness  Lungs:     Clear to auscultation bilaterally, respirations unlabored  Chest wall:    No tenderness or deformity  Heart:    Regular rate and rhythm, S1 and S2 normal, no murmur, rub   or gallop  Abdomen:     Soft, obese, nontender. BS present  Genitalia:    deferred  Rectal:    Per EDP, dark red  Extremities:   Extremities normal, atraumatic, no cyanosis or edema  Pulses:   2+ and symmetric all extremities  Skin:   Skin color, texture, turgor normal, no rashes or lesions  Lymph nodes:   Cervical, supraclavicular, and axillary nodes normal  Neurologic:   CNII-XII intact. Normal strength, sensation and reflexes      throughout             Psych: normal affect Labs on Admission:  Basic Metabolic Panel:  Recent Labs Lab 09/18/14 1611  NA 142  K 4.4  CL 107  CO2 22  GLUCOSE 104*  BUN 17  CREATININE 1.05  CALCIUM 9.3  Liver Function Tests:  Recent Labs Lab 09/18/14 1611  AST 24  ALT 39  ALKPHOS 97  BILITOT 0.3  PROT 6.6  ALBUMIN 3.5   No results found for this basename: LIPASE, AMYLASE,  in the last 168 hours No results found for this basename: AMMONIA,  in the last 168 hours CBC:  Recent Labs Lab 09/18/14 1611  WBC 6.1  NEUTROABS 3.6  HGB 9.3*  HCT 29.5*  MCV 92.5  PLT 200   Cardiac Enzymes: No results found for this basename: CKTOTAL, CKMB, CKMBINDEX, TROPONINI,  in the last 168 hours  BNP (last 3 results)  Recent Labs  12/22/13 0741 05/29/14 1910  06/06/14 1244  PROBNP 27.0 36.3 83.0    Assessment/Plan Principal Problem:  Lower GI bleed: has had 2 bloody stools since yesterday. Blood pressure and hgb stable. Will hold coumadin. As bleeding fairly limited at this point, will not reverse it. Continue plavix, given recent DES. Serial h/h. SDU. Will need to discuss stopping warfarin with Dr. Beryle Beams tomorrow. He follows for iron deficiency anemia, DVT, PV thrombosis.  Dr. Hilarie Fredrickson consulted by EDP, but patient just had EGD and colonoscopy. Active Problems:   OBESITY, MORBID   HYPERTENSION, BENIGN: hold antihypertensives in the setting of bleeding   Peripheral neuropathy   Chronic diastolic CHF (congestive heart failure)   CAD S/P LAD DES after FFR 06/22/14   Esophageal varices without mention of bleeding: workup failed to show cirrhosis.   Portal vein thrombosis   Warfarin anticoagulation  Code Status: full Family Communication: daughter at bedside Disposition Plan: SDU  Time spent: 25 min  [facility] Pager [phone]

## 2014-09-18 NOTE — ED Provider Notes (Signed)
CSN: [id number]     Arrival date & time 09/18/14  1503 History   First MD Initiated Contact with Patient 09/18/14 1550     Chief Complaint  Patient presents with  . Rectal Bleeding     (Consider location/radiation/quality/duration/timing/severity/associated sxs/prior Treatment) HPI Patient is on Coumadin and Plavix for portal vein thrombosis. Today he had an episode of red rectal bleeding. He does not have associated abdominal pain. The patient reports that he does feel short of breath. No associated chest pain. Patient has not had syncopal episode, nor near syncope. In addition to the Coumadin and Plavix patient had been on aspirin and heparin up until the beginning of this week. They report both upper and lower endoscopies for prior GI bleed. The endoscopy showed esophageal varices. The patient has not had any vomiting. They report that the lower endoscopies did not reveal bleeding source.  Past Medical History  Diagnosis Date  . Bradycardia     Hypertensive  . carotid bruit, right   . hypercholesterolemia     Trig 234/293;takes Simcor daily  . Obesity, morbid   . Vasculitis   . 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 Beryle Beams; Dr. Beryle Beams , normal platelets  . Dermatitis     legs  . Bleeding gums   . Chronic low back pain   . Myalgia and myositis   . Unspecified vitamin D deficiency   . Bladder diverticulum   . Primary osteoarthritis of both knees   . Nephrolithiasis   . History of elevated glucose   . Thrombophlebitis     right forearm  . History of BPH   . History of colonic polyps     [name]  . Epididymitis, left     S/P Epidimectomy  . Splenomegaly 05/15/09    abd U/S NML  . Hypertension, benign     takes Micardis daily  . Neuropathy     acute  . Bruises easily   . H/O hiatal hernia   . GERD (gastroesophageal reflux disease)     hx of but doesn't take any meds now  . Urinary frequency   . Urinary urgency   .  Nocturia   . Urinary leakage   . Abnormality of gait 03/19/2013  . DVT of lower limb, acute 11/19/2013    Peroneal & distal tibial left 11/01/13  . Polio 1941    "mild case"  . CHF (congestive heart failure)     "low grade" (06/22/2014)  . DVT (deep venous thrombosis) 10/2013    LLE  . Pneumonia, bacterial     RML resolved, spirometry, normal  . Pneumonia 1933  . History of blood transfusion 05/2014  . Anemia 05/2014    "related to Xarelto"  . Iron deficiency anemia 05/2014    "got iron infusion"  . Arthritis     "all over"  . Gout   . Malignant melanoma of ear     right  . Thrombophlebitis of superficial veins of right lower extremity 06/23/2014    Incidental finding doppler 05/30/14  . Shortness of breath   . Esophageal varices without mention of bleeding 08/08/2014   Past Surgical History  Procedure Laterality Date  . Cystoscopy w/ stone manipulation  1950's  . Fem cutaneous nerve entrapment  1977    due to surgery (mild lat anesth of bilat legs)  . Limited pelvis/hip bone scan  04/99    negative  . Bone scan  04/00  . Cataract extraction  w/ intraocular lens  implant, bilateral Bilateral ~ 2004  . Melanoma excision Right 06/04    ear; Wide local excision,,with flap  . Stress cardiolite  05/11/04    mild inf. ischemia, EF 71%  . Shoulder surgery Left 06/17/2005    "tore it all to pieces when I fell; not fractured"  . Bronchoscopy  09/08    RML collapse with chronic pneumonia, bx neg (Dr. Joya Gaskins)  . Cyst excision Right 03/07/09    middle finger, DIP Joint Mucoid (Dr. Fredna Dow)  . Eyelid & eyebrow lift  1996  . Cyst excision Right 08/15/09    middle finger, DIP Joint Mucoid Cyst (Dr. Fredna Dow)  . Appendectomy  1950  . Excision of mucoid tumor    . Colonoscopy    . Total knee arthroplasty  01/10/2012    Procedure: TOTAL KNEE ARTHROPLASTY;  Surgeon: Augustin Schooling, MD;  Location: [facility];  Service: Orthopedics;  Laterality: Left;  . Surgery scrotal / testicular Left ~ 2000     removal of mass, noncancerous  . Cardiac catheterization  05/23/04    mild plague-statin  . Coronary angioplasty with stent placement  06/22/2014    "1"  . Inguinal hernia repair Bilateral 1959  . Inguinal hernia repair Left 1970's  . Esophagogastroduodenoscopy N/A 08/08/2014    Procedure: ESOPHAGOGASTRODUODENOSCOPY (EGD);  Surgeon: Gatha Mayer, MD;  Location: [facility] ENDOSCOPY;  Service: Endoscopy;  Laterality: N/A;  . Colonoscopy N/A 08/08/2014    Procedure: COLONOSCOPY;  Surgeon: Gatha Mayer, MD;  Location: [place];  Service: Endoscopy;  Laterality: N/A;   Family History  Problem Relation Age of Onset  . Thyroid cancer Mother   . Diabetes Father   . Heart disease Father   . Stroke Brother     cerebral hemm  . Colon cancer Brother   . Anesthesia problems Neg Hx   . Hypotension Neg Hx   . Malignant hyperthermia Neg Hx   . Pseudochol deficiency Neg Hx   . Prostate cancer Neg Hx   . Heart attack Father    History  Substance Use Topics  . Smoking status: Former Smoker -- 2.00 packs/day for 20 years    Types: Cigarettes, Cigars    Quit date: 09/27/1967  . Smokeless tobacco: Never Used  . Alcohol Use: No    Review of Systems 10 Systems reviewed and are negative for acute change except as noted in the HPI.    Allergies  Aleve; Rofecoxib; Sulfa antibiotics; Sulfamethoxazole-trimethoprim; Tramadol hcl; Neomycin-bacitracin zn-polymyx; Neosporin; and Penicillins  Home Medications   Prior to Admission medications   Medication Sig Start Date End Date Taking? Authorizing Provider  allopurinol (ZYLOPRIM) 300 MG tablet Take 300 mg by mouth every morning.    Yes Historical Provider, MD  Cholecalciferol (VITAMIN D3) 2000 UNITS capsule Take 2,000 Units by mouth every morning.    Yes Historical Provider, MD  clopidogrel (PLAVIX) 75 MG tablet Take 75 mg by mouth every morning.    Yes Historical Provider, MD  furosemide (LASIX) 40 MG tablet Take 80 mg by mouth every morning.    Yes  Historical Provider, MD  Misc Natural Products (OSTEO BI-FLEX JOINT SHIELD PO) Take 1 tablet by mouth every morning.    Yes Historical Provider, MD  Multiple Vitamin (MULTIVITAMIN) tablet Take 1 tablet by mouth every morning.    Yes Historical Provider, MD  pantoprazole (PROTONIX) 40 MG tablet Take 40 mg by mouth at bedtime.   Yes Historical Provider, MD  Pitavastatin Calcium (LIVALO)  4 MG TABS Take 4 mg by mouth at bedtime.   Yes Historical Provider, MD  potassium chloride SA (K-DUR,KLOR-CON) 20 MEQ tablet Take 20 mEq by mouth at bedtime.   Yes Historical Provider, MD  Probiotic Product (PROBIOTIC & ACIDOPHILUS EX ST PO) Take 1 tablet by mouth every morning.    Yes Historical Provider, MD  Psyllium (METAMUCIL PO) Take 20 mLs by mouth every evening.   Yes Historical Provider, MD  ranitidine (ZANTAC) 150 MG tablet Take 150 mg by mouth at bedtime.   Yes Historical Provider, MD  telmisartan (MICARDIS) 40 MG tablet Take 40 mg by mouth every morning.    Yes Historical Provider, MD  warfarin (COUMADIN) 5 MG tablet Take 5-7.5 mg by mouth daily. Take 7.5 mg on Mondays and Thursdays. Take 5 mg on all other days.   Yes Historical Provider, MD  nitroGLYCERIN (NITROSTAT) 0.4 MG SL tablet Place 1 tablet (0.4 mg total) under the tongue every 5 (five) minutes as needed. For chest pain 07/07/14   Larey Dresser, MD   BP 127/58  Pulse 58  Temp(Src) 97.5 F (36.4 C) (Oral)  Resp 14  SpO2 100% Physical Exam Constitutional: Patient is alert and oriented x3. He ambulatory into the emergency department, his color is good. He has central obesity. Eyes: Extraocular motions intact pupils symmetric and responsive Oral cavity: Makes her meds are pink and moist posterior records widely patent Neck: Supple Lungs: Soft breath sounds at the bases and symmetric air flow no gross wheeze rhonchi rail. Cardiovascular: Heart sounds distant regular. Distal pulses are 2+ is Abdomen: Positive bowel sounds, abdomen is obese soft  and nontender. Extremities; patient has trace pitting edema of the lower legs Neurologic: Patient is alert and oriented x3, he has a coordinated and symmetric gait. all those are coordinated and symmetric without difficulty Rectal: Melanotic stool in the vault.  ED Course  Procedures (including critical care time) Labs Review Labs Reviewed  CBC WITH DIFFERENTIAL - Abnormal; Notable for the following:    RBC 3.19 (*)    Hemoglobin 9.3 (*)    HCT 29.5 (*)    RDW 20.3 (*)    All other components within normal limits  COMPREHENSIVE METABOLIC PANEL - Abnormal; Notable for the following:    Glucose, Bld 104 (*)    GFR calc non Af Amer 63 (*)    GFR calc Af Amer 73 (*)    All other components within normal limits  APTT - Abnormal; Notable for the following:    aPTT 48 (*)    All other components within normal limits  PROTIME-INR - Abnormal; Notable for the following:    Prothrombin Time 28.3 (*)    INR 2.65 (*)    All other components within normal limits  HEMOGLOBIN AND HEMATOCRIT, BLOOD - Abnormal; Notable for the following:    Hemoglobin 8.6 (*)    HCT 27.0 (*)    All other components within normal limits  HEMOGLOBIN AND HEMATOCRIT, BLOOD  HEMOGLOBIN AND HEMATOCRIT, BLOOD  HEMOGLOBIN AND HEMATOCRIT, BLOOD  I-STAT TROPOININ, ED  TYPE AND SCREEN    Imaging Review No results found.   EKG Interpretation None     Patient's case was consult with Dr. Germain Osgood (GI): Plan will be for admission to the hospitalist group and they will evaluate the patient further.    The patient's case consult with Dr. Berna Spare for hospitalist admission.  Patient presents as outlined above. His vital signs are stable. He is anticoagulated. Patient  does have melanotic stool and history GI bleed. The patient will be admitted for monitoring and further testing as indicated.  CRITICAL CARE Performed by: Charlesetta Shanks   Total critical care time: 30  Critical care time was exclusive of  separately billable procedures and treating other patients.  Critical care was necessary to treat or prevent imminent or life-threatening deterioration.  Critical care was time spent personally by me on the following activities: development of treatment plan with patient and/or surrogate as well as nursing, discussions with consultants, evaluation of patient's response to treatment, examination of patient, obtaining history from patient or surrogate, ordering and performing treatments and interventions, ordering and review of laboratory studies, ordering and review of radiographic studies, pulse oximetry and re-evaluation of patient's condition. MDM     Final diagnoses:  Acute lower GI bleeding  Anticoagulated on Coumadin       Charlesetta Shanks, MD 09/19/14 0155

## 2014-09-18 NOTE — ED Notes (Signed)
Pt here with family c/o BRB rectal bleeding x 2 days; pt on coumadin and plavix for blood clot in portal vein per family; pt was taking heparin and ASA in addition up until this week; pt sts hx of esophageal varices as well

## 2014-09-19 ENCOUNTER — Encounter (HOSPITAL_COMMUNITY): Payer: Self-pay | Admitting: *Deleted

## 2014-09-19 ENCOUNTER — Ambulatory Visit: Payer: Self-pay | Admitting: Adult Health

## 2014-09-19 ENCOUNTER — Encounter: Payer: Self-pay | Admitting: Adult Health

## 2014-09-19 ENCOUNTER — Encounter (HOSPITAL_COMMUNITY): Payer: Medicare Other

## 2014-09-19 DIAGNOSIS — K625 Hemorrhage of anus and rectum: Secondary | ICD-10-CM

## 2014-09-19 DIAGNOSIS — K922 Gastrointestinal hemorrhage, unspecified: Secondary | ICD-10-CM

## 2014-09-19 DIAGNOSIS — I2510 Atherosclerotic heart disease of native coronary artery without angina pectoris: Secondary | ICD-10-CM

## 2014-09-19 DIAGNOSIS — I82409 Acute embolism and thrombosis of unspecified deep veins of unspecified lower extremity: Secondary | ICD-10-CM

## 2014-09-19 DIAGNOSIS — I81 Portal vein thrombosis: Secondary | ICD-10-CM

## 2014-09-19 DIAGNOSIS — Z7902 Long term (current) use of antithrombotics/antiplatelets: Secondary | ICD-10-CM

## 2014-09-19 LAB — CBC
HCT: 28 % — ABNORMAL LOW (ref 39.0–52.0)
HEMOGLOBIN: 9.1 g/dL — AB (ref 13.0–17.0)
MCH: 29.9 pg (ref 26.0–34.0)
MCHC: 32.5 g/dL (ref 30.0–36.0)
MCV: 92.1 fL (ref 78.0–100.0)
Platelets: 196 10*3/uL (ref 150–400)
RBC: 3.04 MIL/uL — AB (ref 4.22–5.81)
RDW: 20.8 % — ABNORMAL HIGH (ref 11.5–15.5)
WBC: 5.1 10*3/uL (ref 4.0–10.5)

## 2014-09-19 LAB — HEMOGLOBIN AND HEMATOCRIT, BLOOD
HCT: 27.8 % — ABNORMAL LOW (ref 39.0–52.0)
HEMOGLOBIN: 8.9 g/dL — AB (ref 13.0–17.0)

## 2014-09-19 LAB — MRSA PCR SCREENING: MRSA by PCR: NEGATIVE

## 2014-09-19 MED ORDER — PHYTONADIONE 5 MG PO TABS
2.5000 mg | ORAL_TABLET | Freq: Once | ORAL | Status: AC
  Administered 2014-09-19: 2.5 mg via ORAL
  Filled 2014-09-19: qty 1

## 2014-09-19 NOTE — Progress Notes (Signed)
Patient ID: James [name], male   DOB: 1928-11-05, 80 y.o.   MRN: [id number] Pleasant 80 year old man well-known to me. Please see my office summary note dated 09/05/2014 for complete details.  I initially evaluated him for acute thrombocytopenia back in 2010 felt likely related to an atypical reaction to Septra. I reevaluated him again in December 2014 following an acute left lower extremity DVT. He was anticoagulated with Xarelto for 6 months. Just about the time that the six-month course was complete, he presented with an unexplained fall in his hemoglobin. He was admitted to the hospital in July 2015. There were no gross signs of bleeding but it appeared he had a subacute bleed. This was felt possibly related to the Xarelto. He recently had an extensive GI evaluation which unexpectedly showed signs of portal hypertension with mid and distal nonbleeding esophageal varices related to what appears to have been an acute or subacute occlusion of both the left and right portal veins. I obtained a d-dimer which was significantly elevated at 6.08 units compared to a normal value obtained in May of this year. I felt he needed to be back on anticoagulation. He was started on Lovenox and Coumadin on 9/14. Initial dose 5 mg daily then titrated just last week to increase the dose to 7.5 mg twice a week and 5 mg on the remaining days of the week. Although there is a suspicion that he may have a underlying familial coagulopathy, the standard tests were unrevealing in this regard with a negative hypercoagulation profile. He now presents with hematochezia. Hemoglobin has drifted down from recent value of 9.8 on September 14 2 current value of 8.6. INR is in the therapeutic range at 2.4 on September 21 and 2.65 on September 27. PTT is elevated but this is an effect of the Coumadin. A lupus-type anticoagulant was not detected when assessed back in December 2014.  Impression: Lower GI bleed versus bleeding from upper GI  source. Possibility of small bowel AVMs should be considered in this elderly man.  His situation is complicated by recent subacute portal vein thrombosis and underlying active cardiac disease.  Recommendation: We don't have any alternative but to hold his Coumadin until source of bleeding is located. Transfuse as needed to keep hemoglobin above 8 g in view of his cardiac condition. I would give him a small oral dose of vitamin K,  2 mg pending further GI evaluation. I do not think he is a candidate for a vena cava filter at this time. Once GI issues have been explored, and when he stops bleeding, I would put him back on Coumadin.  Thank you for this consultation I will follow him with you. Above discussed with hospitalist physician.

## 2014-09-19 NOTE — Consult Note (Signed)
Referring Provider: [facility] Primary Care Physician:  [name], MD Primary Gastroenterologist:  Dr.Gessner  Reason for Consultation:   GI bleed    HPI: James [name] is a 80 y.o. male with history of nonobstructive CAD with DES on warfarin and plavix,chronic diastolic CHF , DVT, portal vein thrombosis, and Iron deficiency anemia who presented to ER last pm with bloody stool. Sat he had a formed BM with a small amount red blood. Sunday he had a large, red BM. He reports that he came to the ER and has had severe episodes of a fleeting, sharp, cramplike abd pain that last for 1-2 seconds and then goes away. No n/v. No diarrhea.  I the ER he was noted to have dark, red blood on rectal exam. He reports that he does feel weak when standing or changing position. He had an EGD 08/08/14 that revealed esophageal varices and a colonoscopy 08/08/14 that showed 2 diminutive sessile polyps which were not removed. Pt had a large BM this morning per nurse that was brown with a small amount blood in water.     Past Medical History  Diagnosis Date  . Bradycardia     Hypertensive  . carotid bruit, right   . hypercholesterolemia     Trig 234/293;takes Simcor daily  . Obesity, morbid   . Vasculitis   . 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 Beryle Beams; Dr. Beryle Beams , normal platelets  . Dermatitis     legs  . Bleeding gums   . Chronic low back pain   . Myalgia and myositis   . Unspecified vitamin D deficiency   . Bladder diverticulum   . Primary osteoarthritis of both knees   . Nephrolithiasis   . History of elevated glucose   . Thrombophlebitis     right forearm  . History of BPH   . History of colonic polyps     [name]  . Epididymitis, left     S/P Epidimectomy  . Splenomegaly 05/15/09    abd U/S NML  . Hypertension, benign     takes Micardis daily  . Neuropathy     acute  . Bruises easily   . H/O hiatal hernia   . GERD  (gastroesophageal reflux disease)     hx of but doesn't take any meds now  . Urinary frequency   . Urinary urgency   . Nocturia   . Urinary leakage   . Abnormality of gait 03/19/2013  . DVT of lower limb, acute 11/19/2013    Peroneal & distal tibial left 11/01/13  . Polio 1941    "mild case"  . CHF (congestive heart failure)     "low grade" (06/22/2014)  . DVT (deep venous thrombosis) 10/2013    LLE  . Pneumonia, bacterial     RML resolved, spirometry, normal  . Pneumonia 1933  . History of blood transfusion 05/2014  . Anemia 05/2014    "related to Xarelto"  . Iron deficiency anemia 05/2014    "got iron infusion"  . Arthritis     "all over"  . Gout   . Malignant melanoma of ear     right  . Thrombophlebitis of superficial veins of right lower extremity 06/23/2014    Incidental finding doppler 05/30/14  . Shortness of breath   . Esophageal varices without mention of bleeding 08/08/2014    Past Surgical History  Procedure Laterality Date  . Cystoscopy w/ stone manipulation  1950's  .  Fem cutaneous nerve entrapment  1977    due to surgery (mild lat anesth of bilat legs)  . Limited pelvis/hip bone scan  04/99    negative  . Bone scan  04/00  . Cataract extraction w/ intraocular lens  implant, bilateral Bilateral ~ 2004  . Melanoma excision Right 06/04    ear; Wide local excision,,with flap  . Stress cardiolite  05/11/04    mild inf. ischemia, EF 71%  . Shoulder surgery Left 06/17/2005    "tore it all to pieces when I fell; not fractured"  . Bronchoscopy  09/08    RML collapse with chronic pneumonia, bx neg (Dr. Joya Gaskins)  . Cyst excision Right 03/07/09    middle finger, DIP Joint Mucoid (Dr. Fredna Dow)  . Eyelid & eyebrow lift  1996  . Cyst excision Right 08/15/09    middle finger, DIP Joint Mucoid Cyst (Dr. Fredna Dow)  . Appendectomy  1950  . Excision of mucoid tumor    . Colonoscopy    . Total knee arthroplasty  01/10/2012    Procedure: TOTAL KNEE ARTHROPLASTY;  Surgeon: Augustin Schooling, MD;  Location: [facility];  Service: Orthopedics;  Laterality: Left;  . Surgery scrotal / testicular Left ~ 2000    removal of mass, noncancerous  . Cardiac catheterization  05/23/04    mild plague-statin  . Coronary angioplasty with stent placement  06/22/2014    "1"  . Inguinal hernia repair Bilateral 1959  . Inguinal hernia repair Left 1970's  . Esophagogastroduodenoscopy N/A 08/08/2014    Procedure: ESOPHAGOGASTRODUODENOSCOPY (EGD);  Surgeon: Gatha Mayer, MD;  Location: [facility];  Service: Endoscopy;  Laterality: N/A;  . Colonoscopy N/A 08/08/2014    Procedure: COLONOSCOPY;  Surgeon: Gatha Mayer, MD;  Location: [facility];  Service: Endoscopy;  Laterality: N/A;    Prior to Admission medications   Medication Sig Start Date End Date Taking? Authorizing Provider  allopurinol (ZYLOPRIM) 300 MG tablet Take 300 mg by mouth every morning.    Yes Historical Provider, MD  Cholecalciferol (VITAMIN D3) 2000 UNITS capsule Take 2,000 Units by mouth every morning.    Yes Historical Provider, MD  clopidogrel (PLAVIX) 75 MG tablet Take 75 mg by mouth every morning.    Yes Historical Provider, MD  furosemide (LASIX) 40 MG tablet Take 80 mg by mouth every morning.    Yes Historical Provider, MD  Misc Natural Products (OSTEO BI-FLEX JOINT SHIELD PO) Take 1 tablet by mouth every morning.    Yes Historical Provider, MD  Multiple Vitamin (MULTIVITAMIN) tablet Take 1 tablet by mouth every morning.    Yes Historical Provider, MD  pantoprazole (PROTONIX) 40 MG tablet Take 40 mg by mouth at bedtime.   Yes Historical Provider, MD  Pitavastatin Calcium (LIVALO) 4 MG TABS Take 4 mg by mouth at bedtime.   Yes Historical Provider, MD  potassium chloride SA (K-DUR,KLOR-CON) 20 MEQ tablet Take 20 mEq by mouth at bedtime.   Yes Historical Provider, MD  Probiotic Product (PROBIOTIC & ACIDOPHILUS EX ST PO) Take 1 tablet by mouth every morning.    Yes Historical Provider, MD  Psyllium (METAMUCIL PO) Take 20 mLs  by mouth every evening.   Yes Historical Provider, MD  ranitidine (ZANTAC) 150 MG tablet Take 150 mg by mouth at bedtime.   Yes Historical Provider, MD  telmisartan (MICARDIS) 40 MG tablet Take 40 mg by mouth every morning.    Yes Historical Provider, MD  warfarin (COUMADIN) 5 MG tablet Take 5-7.5 mg  by mouth daily. Take 7.5 mg on Mondays and Thursdays. Take 5 mg on all other days.   Yes Historical Provider, MD  nitroGLYCERIN (NITROSTAT) 0.4 MG SL tablet Place 1 tablet (0.4 mg total) under the tongue every 5 (five) minutes as needed. For chest pain 07/07/14   Larey Dresser, MD    Current Facility-Administered Medications  Medication Dose Route Frequency Provider Last Rate Last Dose  . 0.9 %  sodium chloride infusion   Intravenous Continuous Delfina Redwood, MD      . acetaminophen (TYLENOL) tablet 650 mg  650 mg Oral Q6H PRN Delfina Redwood, MD       Or  . acetaminophen (TYLENOL) suppository 650 mg  650 mg Rectal Q6H PRN Delfina Redwood, MD      . allopurinol (ZYLOPRIM) tablet 300 mg  300 mg Oral Daily Delfina Redwood, MD      . clopidogrel (PLAVIX) tablet 75 mg  75 mg Oral BH-q7a Delfina Redwood, MD      . furosemide (LASIX) tablet 80 mg  80 mg Oral BH-q7a Delfina Redwood, MD   80 mg at 09/19/14 0659  . nitroGLYCERIN (NITROSTAT) SL tablet 0.4 mg  0.4 mg Sublingual Q5 min PRN Delfina Redwood, MD      . ondansetron [facility]) tablet 4 mg  4 mg Oral Q6H PRN Delfina Redwood, MD       Or  . ondansetron (ZOFRAN) injection 4 mg  4 mg Intravenous Q6H PRN Delfina Redwood, MD      . pantoprazole (PROTONIX) EC tablet 40 mg  40 mg Oral QHS Delfina Redwood, MD   40 mg at 09/18/14 2242  . potassium chloride SA (K-DUR,KLOR-CON) CR tablet 20 mEq  20 mEq Oral QHS Delfina Redwood, MD   20 mEq at 09/18/14 2242  . sodium chloride 0.9 % injection 3 mL  3 mL Intravenous Q12H Delfina Redwood, MD   3 mL at 09/18/14 2242    Allergies as of 09/18/2014 - Review Complete 09/18/2014    Allergen Reaction Noted  . Aleve [naproxen sodium] Other (See Comments) 07/29/2014  . Rofecoxib Other (See Comments)   . Sulfa antibiotics Other (See Comments) 06/16/2014  . Sulfamethoxazole-trimethoprim Other (See Comments) 06/09/2009  . Tramadol hcl Other (See Comments) 12/07/2008  . Neomycin-bacitracin zn-polymyx Rash 06/16/2014  . Neosporin [neomycin-polymyxin-gramicidin] Rash 12/31/2011  . Penicillins Swelling and Rash     Family History  Problem Relation Age of Onset  . Thyroid cancer Mother   . Diabetes Father   . Heart disease Father   . Stroke Brother     cerebral hemm  . Colon cancer Brother   . Anesthesia problems Neg Hx   . Hypotension Neg Hx   . Malignant hyperthermia Neg Hx   . Pseudochol deficiency Neg Hx   . Prostate cancer Neg Hx   . Heart attack Father     History   Social History  . Marital Status: Widowed    Spouse Name: N/A    Number of Children: 2  . Years of Education: college   Occupational History  . Retired     1996 [employer]   Social History Main Topics  . Smoking status: Former Smoker -- 2.00 packs/day for 20 years    Types: Cigarettes, Cigars    Quit date: 09/27/1967  . Smokeless tobacco: Never Used  . Alcohol Use: No  . Drug Use: No  . Sexual Activity:  Not on file   Other Topics Concern  . Not on file   Social History Narrative   Widowed 2013 after 63+ years of marriage, Wife had CHF and multiple myeloma   [name], daughter in Sports coach, lives with patient   Patient does not get regular exercise   No caffeine   Retired from [employer] ([employer]), retired from [employer]: Gen: Denies any fever, chills, sweats, anorexia, fatigue, weakness, malaise, weight loss, and sleep disorder CV: Denies chest pain, angina, palpitations, syncope, orthopnea, PND, peripheral edema, and claudication. Resp: Has some dyspnea at rest, has dyspnea with exercise,denies cough, sputum, wheezing, coughing up blood,  and pleurisy. GI: Denies vomiting blood, jaundice, and fecal incontinence.   Denies dysphagia or odynophagia. GU : Denies urinary burning, blood in urine, urinary frequency, urinary hesitancy, nocturnal urination, and urinary incontinence. MS: Denies joint pain, limitation of movement, and swelling, stiffness, low back pain, extremity pain. Denies muscle weakness, cramps, atrophy.  Derm: Denies rash, itching, dry skin, hives, moles, warts, or unhealing ulcers.  Psych: Denies depression, anxiety, memory loss, suicidal ideation, hallucinations, paranoia, and confusion. Heme: Denies bruising, bleeding, and enlarged lymph nodes. Neuro:  Denies any headaches, dizziness, paresthesias. Endo:  Denies any problems with DM, thyroid, adrenal function.  Physical Exam: Vital signs in last 24 hours: Temp:  [97.5 F (36.4 C)] 97.5 F (36.4 C) (09/28 0745) Pulse Rate:  [56-90] 56 (09/28 0745) Resp:  [11-34] 14 (09/28 0745) BP: (90-133)/(27-87) 90/27 mmHg (09/28 0745) SpO2:  [94 %-100 %] 100 % (09/28 0745) Weight:  [270 lb 15.1 oz (122.9 kg)] 270 lb 15.1 oz (122.9 kg) (09/28 0545)   General:   Alert,  Well-developed, well-nourished, pleasant and cooperative in NAD Head:  Normocephalic and atraumatic. Eyes:  Sclera clear, no icterus.   Conjunctiva pink. Ears:  Normal auditory acuity. Nose:  No deformity, discharge,  or lesions. Mouth:  No deformity or lesions.   Neck:  Supple; no masses or thyromegaly. Lungs:  Clear throughout to auscultation.   No wheezes, crackles, or rhonchi.  Heart:  Regular rate and rhythm; no murmurs, clicks, rubs,  or gallops. Abdomen:  Soft,mild right lower quadrant tenderness, BS active,nonpalp mass or hsm.   Rectal:  Deferred  Msk:  Symmetrical without gross deformities. . Pulses:  Normal pulses noted. Extremities:  Without clubbing or edema. Neurologic:  Alert and  oriented x4;  grossly normal neurologically. Skin:  Intact without significant lesions or rashes.. Psych:   Alert and cooperative. Normal mood and affect.  Intake/Output from previous day: 09/27 0701 - 09/28 0700 In: 3 [I.V.:3] Out: 450 [Urine:450] Intake/Output this shift: Total I/O In: -  Out: 350 [Urine:350]  Lab Results:  Recent Labs  09/18/14 1611 09/18/14 2209 09/19/14 0509  WBC 6.1  --   --   HGB 9.3* 8.6* 8.9*  HCT 29.5* 27.0* 27.8*  PLT 200  --   --    BMET  Recent Labs  09/18/14 1611  NA 142  K 4.4  CL 107  CO2 22  GLUCOSE 104*  BUN 17  CREATININE 1.05  CALCIUM 9.3   LFT  Recent Labs  09/18/14 1611  PROT 6.6  ALBUMIN 3.5  AST 24  ALT 39  ALKPHOS 97  BILITOT 0.3   PT/INR  Recent Labs  09/18/14 1611  LABPROT 28.3*  INR 2.65*   PROCEDURES: Colonoscopy 08/08/14: ENDOSCOPIC IMPRESSION: 1. Two diminutive sessile polyps were found at the cecum and in the  ascending colon - not removed 2. The colon mucosa was otherwise normal RECOMMENDATIONS: Did not remove polyps as was on clopidogrel and they are not and should not ever cause problems for him in my opinion Appears to have cirrhosis since I discovered esophageal varices at EGD - think leaking blood from there.  EGD 08/08/14: ENDOSCOPIC IMPRESSION: 1. There were 3 columns of small varices in the distal and middle third of the esophagus; The were was evidence of a red wale sign 2. Portal hypertensive gastropathy was found in the cardia, gastric fundus, and gastric body 3. The remainder of the upper endoscopy exam was otherwise normal  IMPRESSION/PLAN: 80 yo male with esoph varices, on coumadin and plavix due to recent DES and hx DVT, admitted with GI bleed. Coumadin has been discontinued. HG/H stable.BP low this morning--diuretic being held per medicine..Will review pt with Dr Hilarie Fredrickson as to if other anticoagulation to be held. Willl likely watch/ wait before proceeding with endoscopic eval as pt recently had EGD/Colonoscopy. ? Pill endo at later date.     Roisin Mones, [name] 09/19/2014,  Pager  [phone]

## 2014-09-19 NOTE — Progress Notes (Signed)
CARE MANAGEMENT NOTE 09/19/2014  Patient:  James [name], James [name]   Account Number:  [id number]  Date Initiated:  09/19/2014  Documentation initiated by:  [facility]  Subjective/Objective Assessment:   GI bleed     Action/Plan:   lives at home with dtr, Lawernce Pitts   Anticipated DC Date:  09/21/2014   Anticipated DC Plan:  [place]  CM consult      Choice offered to / List presented to:             Status of service:  Completed, signed off Medicare Important Message given?   (If response is "NO", the following Medicare IM given date fields will be blank) Date Medicare IM given:   Medicare IM given by:   Date Additional Medicare IM given:   Additional Medicare IM given by:    Discharge Disposition:    Per UR Regulation:    If discussed at Long Length of Stay Meetings, dates discussed:    Comments:  09/19/2014 1100 NCM spoke to pt and gave permission to speak with dtr, James [name]. Dtr states home is handicap accessible and they have DME (RW, wheelchair) at home. Waiting final recommendations for home. Jonnie Finner RN CCM Case Mgmt phone [phone]

## 2014-09-19 NOTE — Consult Note (Signed)
CARDIOLOGY CONSULT NOTE     Patient ID: James [name] MRN: [id number] DOB/AGE: 78/02/1928 78 y.o.  Admit date: 09/18/2014 Referring Physician Doree Barthel MD Primary Physician [name], MD Primary Cardiologist Loralie Champagne MD Reason for Consultation GI bleed.  HPI:  Very pleasant 80 yo WM admitted with GI bleed. Patient has a history of CAD and is s/p DES of proximal LAD on 06/22/14. He presented with progressive dyspnea on exertion. Myoview study was low risk with inferior wall ischemia. Cardiac cath showed a 60-70% proximal LAD stenosis with significantly abnormal FFR of .73. After stenting he was treated with ASA and Plavix. He continued to have dyspnea on exertion. In August he underwent GI evaluation for heme positive stool and was found to have esophageal varices. There were small colon polyps that were left alone. Abd [place] and CT confirmed the presence of portal vein thrombosis. He was started on coumadin. ASA was discontinued 10 days ago and Lovenox was discontinued 7 days ago.  Saturday the patient noted a small amount of blood in stool. Yesterday he had a significant amount of bleeding with stool. Bright red blood. Felt weak. INR therapeutic at 2.65. Hgb dropped from 9.8 to 8.6. No recurrent bleeding. Hgb today is 8.9. No chest pain.  Past Medical History  Diagnosis Date  . Bradycardia     Hypertensive  . carotid bruit, right   . hypercholesterolemia     Trig 234/293;takes Simcor daily  . Obesity, morbid   . Vasculitis   . 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 Beryle Beams; Dr. Beryle Beams , normal platelets  . Dermatitis     legs  . Bleeding gums   . Chronic low back pain   . Myalgia and myositis   . Unspecified vitamin D deficiency   . Bladder diverticulum   . Primary osteoarthritis of both knees   . Nephrolithiasis   . History of elevated glucose   . Thrombophlebitis     right forearm  . History of BPH   . History of  colonic polyps     [name]  . Epididymitis, left     S/P Epidimectomy  . Splenomegaly 05/15/09    abd U/S NML  . Hypertension, benign     takes Micardis daily  . Neuropathy     acute  . Bruises easily   . H/O hiatal hernia   . GERD (gastroesophageal reflux disease)     hx of but doesn't take any meds now  . Urinary frequency   . Urinary urgency   . Nocturia   . Urinary leakage   . Abnormality of gait 03/19/2013  . DVT of lower limb, acute 11/19/2013    Peroneal & distal tibial left 11/01/13  . Polio 1941    "mild case"  . CHF (congestive heart failure)     "low grade" (06/22/2014)  . DVT (deep venous thrombosis) 10/2013    LLE  . Pneumonia, bacterial     RML resolved, spirometry, normal  . Pneumonia 1933  . History of blood transfusion 05/2014  . Anemia 05/2014    "related to Xarelto"  . Iron deficiency anemia 05/2014    "got iron infusion"  . Arthritis     "all over"  . Gout   . Malignant melanoma of ear     right  . Thrombophlebitis of superficial veins of right lower extremity 06/23/2014    Incidental finding doppler 05/30/14  . Shortness of breath   .  Esophageal varices without mention of bleeding 08/08/2014  . Portal vein thrombosis     Family History  Problem Relation Age of Onset  . Thyroid cancer Mother   . Diabetes Father   . Heart disease Father   . Stroke Brother     cerebral hemm  . Colon cancer Brother   . Anesthesia problems Neg Hx   . Hypotension Neg Hx   . Malignant hyperthermia Neg Hx   . Pseudochol deficiency Neg Hx   . Prostate cancer Neg Hx   . Heart attack Father     History   Social History  . Marital Status: Widowed    Spouse Name: N/A    Number of Children: 2  . Years of Education: college   Occupational History  . Retired     1996 [employer]   Social History Main Topics  . Smoking status: Former Smoker -- 2.00 packs/day for 20 years    Types: Cigarettes, Cigars    Quit date: 09/27/1967  . Smokeless tobacco: Never  Used  . Alcohol Use: No  . Drug Use: No  . Sexual Activity: Not on file   Other Topics Concern  . Not on file   Social History Narrative   Widowed 2013 after 63+ years of marriage, Wife had CHF and multiple myeloma   [name], daughter in Sports coach, lives with patient   Patient does not get regular exercise   No caffeine   Retired from [employer] ([employer]), retired from [employer]     Past Surgical History  Procedure Laterality Date  . Cystoscopy w/ stone manipulation  1950's  . Fem cutaneous nerve entrapment  1977    due to surgery (mild lat anesth of bilat legs)  . Limited pelvis/hip bone scan  04/99    negative  . Bone scan  04/00  . Cataract extraction w/ intraocular lens  implant, bilateral Bilateral ~ 2004  . Melanoma excision Right 06/04    ear; Wide local excision,,with flap  . Stress cardiolite  05/11/04    mild inf. ischemia, EF 71%  . Shoulder surgery Left 06/17/2005    "tore it all to pieces when I fell; not fractured"  . Bronchoscopy  09/08    RML collapse with chronic pneumonia, bx neg (Dr. Joya Gaskins)  . Cyst excision Right 03/07/09    middle finger, DIP Joint Mucoid (Dr. Fredna Dow)  . Eyelid & eyebrow lift  1996  . Cyst excision Right 08/15/09    middle finger, DIP Joint Mucoid Cyst (Dr. Fredna Dow)  . Appendectomy  1950  . Excision of mucoid tumor    . Colonoscopy    . Total knee arthroplasty  01/10/2012    Procedure: TOTAL KNEE ARTHROPLASTY;  Surgeon: Augustin Schooling, MD;  Location: [place];  Service: Orthopedics;  Laterality: Left;  . Surgery scrotal / testicular Left ~ 2000    removal of mass, noncancerous  . Cardiac catheterization  05/23/04    mild plague-statin  . Coronary angioplasty with stent placement  06/22/2014    "1"  . Inguinal hernia repair Bilateral 1959  . Inguinal hernia repair Left 1970's  . Esophagogastroduodenoscopy N/A 08/08/2014    Procedure: ESOPHAGOGASTRODUODENOSCOPY (EGD);  Surgeon: Gatha Mayer, MD;  Location: [facility] ENDOSCOPY;  Service:  Endoscopy;  Laterality: N/A;  . Colonoscopy N/A 08/08/2014    Procedure: COLONOSCOPY;  Surgeon: Gatha Mayer, MD;  Location: [place];  Service: Endoscopy;  Laterality: N/A;     Prescriptions  prior to admission  Medication Sig Dispense Refill  . allopurinol (ZYLOPRIM) 300 MG tablet Take 300 mg by mouth every morning.       . Cholecalciferol (VITAMIN D3) 2000 UNITS capsule Take 2,000 Units by mouth every morning.       . clopidogrel (PLAVIX) 75 MG tablet Take 75 mg by mouth every morning.       . furosemide (LASIX) 40 MG tablet Take 80 mg by mouth every morning.       . Misc Natural Products (OSTEO BI-FLEX JOINT SHIELD PO) Take 1 tablet by mouth every morning.       . Multiple Vitamin (MULTIVITAMIN) tablet Take 1 tablet by mouth every morning.       . pantoprazole (PROTONIX) 40 MG tablet Take 40 mg by mouth at bedtime.      . Pitavastatin Calcium (LIVALO) 4 MG TABS Take 4 mg by mouth at bedtime.      . potassium chloride SA (K-DUR,KLOR-CON) 20 MEQ tablet Take 20 mEq by mouth at bedtime.      . Probiotic Product (PROBIOTIC & ACIDOPHILUS EX ST PO) Take 1 tablet by mouth every morning.       . Psyllium (METAMUCIL PO) Take 20 mLs by mouth every evening.      . ranitidine (ZANTAC) 150 MG tablet Take 150 mg by mouth at bedtime.      Marland Kitchen telmisartan (MICARDIS) 40 MG tablet Take 40 mg by mouth every morning.       . warfarin (COUMADIN) 5 MG tablet Take 5-7.5 mg by mouth daily. Take 7.5 mg on Mondays and Thursdays. Take 5 mg on all other days.      . nitroGLYCERIN (NITROSTAT) 0.4 MG SL tablet Place 1 tablet (0.4 mg total) under the tongue every 5 (five) minutes as needed. For chest pain  25 tablet  5     ROS: As noted in HPI. All other systems are reviewed and are negative unless otherwise mentioned.   Physical Exam: Blood pressure 90/27, pulse 56, temperature 97.5 F (36.4 C), temperature source Oral, resp. rate 14, height _0  (1.803 m), weight 270 lb 15.1 oz (122.9 kg), SpO2 100.00%.  Current Weight  09/19/14 270 lb 15.1 oz (122.9 kg)  09/13/14 273 lb (123.832 kg)  09/09/14 273 lb (123.832 kg)    GENERAL:  Well appearing WM in NAD HEENT:  PERRL, EOMI, sclera are clear. Oropharynx is clear. NECK:  No jugular venous distention, carotid upstroke brisk and symmetric, no bruits, no thyromegaly or adenopathy LUNGS:  Clear to auscultation bilaterally CHEST:  Unremarkable HEART:  RRR,  PMI not displaced or sustained,S1 and S2 within normal limits, no S3, no S4: no clicks, no rubs, no murmurs ABD:  Soft, obese, nontender. BS +, no masses or bruits. No hepatomegaly, no splenomegaly EXT:  2 + pulses throughout, no edema, no cyanosis no clubbing SKIN:  Warm and dry.  No rashes NEURO:  Alert and oriented x 3. Cranial nerves II through XII intact. PSYCH:  Cognitively intact     Labs:   Lab Results  Component Value Date   WBC 6.1 09/18/2014   HGB 8.9* 09/19/2014   HCT 27.8* 09/19/2014   MCV 92.5 09/18/2014   PLT 200 09/18/2014     Recent Labs Lab 09/18/14 1611  NA 142  K 4.4  CL 107  CO2 22  BUN 17  CREATININE 1.05  CALCIUM 9.3  PROT 6.6  BILITOT 0.3  ALKPHOS 97  ALT 39  AST  24  GLUCOSE 104*   Lab Results  Component Value Date   CKTOTAL 121 08/12/2007    Lab Results  Component Value Date   CHOL 140 10/22/2013   CHOL 164 11/23/2012   CHOL 140 11/25/2011   Lab Results  Component Value Date   HDL 54.20 10/22/2013   HDL 48.80 11/23/2012   HDL 54.00 11/25/2011   Lab Results  Component Value Date   LDLCALC 54 10/22/2013   LDLCALC 63 11/25/2011   LDLCALC 55 09/19/2010   Lab Results  Component Value Date   TRIG 157.0* 10/22/2013   TRIG 220.0* 11/23/2012   TRIG 116.0 11/25/2011   Lab Results  Component Value Date   CHOLHDL 3 10/22/2013   CHOLHDL 3 11/23/2012   CHOLHDL 3 11/25/2011   Lab Results  Component Value Date   LDLDIRECT 83.9 11/23/2012   LDLDIRECT 65.1 02/22/2010   LDLDIRECT 57.0 02/21/2009    Lab Results  Component Value Date   PROBNP 83.0  06/06/2014   PROBNP 36.3 05/29/2014   PROBNP 27.0 12/22/2013   Lab Results  Component Value Date   TSH 5.050* 05/29/2014   Lab Results  Component Value Date   HGBA1C 6.4 05/05/2014    Radiology: No results found.  EKG: NSR, no acute change.  ASSESSMENT AND PLAN:  1. Lower GI bleed. Suspect probable AVM with recent GI evaluation negative for bleeding.  2. CAD s/p DES of proximal LAD on 06/22/14. 3. Portal vein thrombosis 8/15. 4. History of prior DVT. Coagulopathy work up negative. 5. Hyperlipidemia 6. HTN  Plan: Agree with stopping ASA. Coumadin on hold until bleeding stopped. Will probably require long term anticoagulation with history of portal vein thrombosis and prior DVT. Once resumed would target INR 2-2.5. The risk benefit ratio still favors continuing Plavix. The patient has a recent DES and if all anti platelet therapy is stopped he will be at risk for subacute thrombosis of the stent and this would likely result in a large anterior MI. If he has a larger bleed we will need to reassess. It appears his bleeding is limited and Hgb is actually a little better. Will follow.  Signed: Peter Martinique, [place]  09/19/2014, 10:22 AM

## 2014-09-19 NOTE — Consult Note (Signed)
Patient seen, examined, and I agree with the above documentation, including the assessment and plan. Pt with recent PVT on warfarin, PCI on plavix +asa Bleeding has been bright red and rather minor to this point, hgb 0.5 g/dl lower than baseline This is not consistent with UGIB (bright red, minor drop in Hgb, no melena, and normal BUN) Had recent EGD/colon, small varies and PHG (both of which should present like UGI bleeding) If bleeding continues, I recommend flex sig +/- anoscopy (re-eval left colon and exclude hemorrhoids (hemorrhoids can be missed during colonoscopy)) VCE can be considered, but current presentation is also not entirely suspicious for a small source Will discuss plavix with Dr. Aundra Dubin, as I would favor 1 anticoagulant/antiplatelet rather than the 2.  Will not stop plavix at present without cardiology's input given PCI Will follow, check INR tomorrow

## 2014-09-19 NOTE — Progress Notes (Signed)
[facility]  James [name]:[id number] DOB: 05-21-1928 [name]: 09/18/2014 PCP: [name], MD  Brief narrative: 80 year old male patient with complex past medical history. Initially anticoagulated with Xarelto in December 2014 for acute left lower extremity DVT for a 6 month course. Subsequent GI bleeding in July of 2015 lead to w/u which revealed portal hypertension and nonbleeding esophageal varices. At that time it was noted he had an acute versus subacute occlusion of both left and right portal veins. D-dimer was elevated at 6.08 therefore Hematology opted to resume anticoagulation with Lovenox > Coumadin. In addition he underwent cardiac catheterization with placement of drug-eluting stent to the proximal CAD on 06/22/14. Because of this he was placed on Plavix. He had previously been on aspirin as well but this was discontinued with the initiation of Coumadin. He presented to the emergency department on 9/27 with complaints of rectal bleeding. He'd had 2 episodes since onset 24 hours prior to presentation  In the ER the physician noted dark red stool on rectal exam. He was hemodynamically stable. Hemoglobin was stable around 9.3. No orthostatic symptoms.   Since admission patient has had a recurrence of rectal bleeding. I have also consulted hematology who recommended to continue holding Coumadin and to give a one-time dose of vitamin K 2.5 mg orally also recommended to transfuse to keep hemoglobin greater than or equal to 8. Given concerns over recent drug-eluting stent placement requiring Plavix cardiology has also been consulted. They agree with stopping aspirin and Coumadin. At present it is felt that the risk-to-benefit ratio favors continuing Plavix documenting that with recent drug-eluting stent all antiplatelet therapy were stopped he would be at risk for subacute thrombosis of the stent that would likely result in a large anterior MI. If the patient  has a larger bleed we need to reassess continuing Plavix.  HPI/Subjective: Without complaints of pain, shortness of breath, or chest pain. Still with stool covered in blood. No apparent rectal pain with passage of bowel movement  Assessment/Plan:    Chronic Iron deficiency anemia with likely acute blood loss component Agree with Hematologist that should transfuse to keep hemoglobin greater than or equal to 8.0 - baseline hemoglobin appears to be around 10 - serial CBC   Rectal bleeding/recent EGD with Esophageal varices without mention of bleeding Source of bleeding unclear - given appearance of blood doubtful is upper - recent colonoscopy with only a few small polyps which were not biopsied therefore a source does not appear to be colonic in nature - after my discussion with Dr. Beryle Beams we both suspect patient likely has small bowel AVMs - holding anticoagulation except for Plavix    Portal vein thrombosis / history of left lower extremity DVT Appreciate Hematologist assistance - previous coagulopathic workup negative per hematologist - if his source of bleeding can be identified and managed suspect will need lifelong anticoagulation - per Hematologist no indication to pursue IVC filter in setting of portal vein thrombosis      Chronic diastolic CHF  Compensated - blood pressure soft in setting of acute blood loss - discontinued Lasix and potassium    CAD S/P LAD DES after [facility] 06/22/14 Appreciate Cardiology assistance - continue Plavix for now due to risk for acute stent thrombosis that would lead to large anterior MI - okay to hold aspirin     Warfarin anticoagulation Warfarin on hold - vitamin K 2.5 mg by mouth given 9/28 at request of Hematology  HYPERTENSION, BENIGN Current blood pressure soft in setting of acute blood loss anemia-Lasix and Micardis on hold    Peripheral neuropathy   DVT prophylaxis: SCDs Code Status: Full Family Communication: Patient and family  members/friends in room Disposition Plan/Expected LOS: Stepdown while exhibiting signs of acute rectal bleeding  Consultants: Hematology Gastroenterology Cardiology  Procedures: None  Cultures: None  Antibiotics: None  Objective: Blood pressure 122/49, pulse 64, temperature 97.6 F (36.4 C), temperature source Oral, resp. rate 16, height 5\' 11"  (1.803 m), weight 270 lb 15.1 oz (122.9 kg), SpO2 100.00%.  Intake/Output Summary (Last 24 hours) at 09/19/14 1251 Last data filed at 09/19/14 1017  Gross per 24 hour  Intake    203 ml  Output    800 ml  Net   -597 ml    Exam: Gen: No acute respiratory distress Chest: Clear to auscultation bilaterally without wheezes, rhonchi or crackles, room air Cardiac: Regular rate and rhythm, S1-S2, no rubs murmurs or gallops, no peripheral edema Abdomen: Soft nontender nondistended without obvious hepatosplenomegaly, no ascites Extremities: Symmetrical in appearance without cyanosis, clubbing or effusion  Scheduled Meds:  Scheduled Meds: . allopurinol  300 mg Oral Daily  . clopidogrel  75 mg Oral [id number]  . pantoprazole  40 mg Oral QHS  . sodium chloride  3 mL Intravenous Q12H   Data Reviewed: Basic Metabolic Panel:  Recent Labs Lab 09/18/14 1611  NA 142  K 4.4  CL 107  CO2 22  GLUCOSE 104*  BUN 17  CREATININE 1.05  CALCIUM 9.3   Liver Function Tests:  Recent Labs Lab 09/18/14 1611  AST 24  ALT 39  ALKPHOS 97  BILITOT 0.3  PROT 6.6  ALBUMIN 3.5   CBC:  Recent Labs Lab 09/18/14 1611 09/18/14 2209 09/19/14 0509  WBC 6.1  --   --   NEUTROABS 3.6  --   --   HGB 9.3* 8.6* 8.9*  HCT 29.5* 27.0* 27.8*  MCV 92.5  --   --   PLT 200  --   --     Recent Results (from the past 240 hour(s))  MRSA PCR SCREENING     Status: None   Collection Time    09/19/14  6:40 AM      Result Value Ref Range Status   MRSA by PCR NEGATIVE  NEGATIVE Final   Comment:            The GeneXpert MRSA Assay (FDA     approved for  NASAL specimens     only), is one component of a     comprehensive MRSA colonization     surveillance program. It is not     intended to diagnose MRSA     infection nor to guide or     monitor treatment for     MRSA infections.     Studies:  Recent x-ray studies have been reviewed in detail by the Attending Physician  Time spent :  [place], ANP [facility] Office  [phone] Pager [phone]  On-Call/Text Page:      [email or web address]      password [email or web address]  If 7PM-7AM, please contact night-coverage [email or web address] Password [email or web address] 09/19/2014, 12:51 PM   LOS: 1 day   I have personally examined this patient and reviewed the entire database. I have reviewed the above note, made any necessary editorial changes, and agree with its content.  Cherene Altes, MD [facility]

## 2014-09-20 DIAGNOSIS — I509 Heart failure, unspecified: Secondary | ICD-10-CM

## 2014-09-20 DIAGNOSIS — Z5181 Encounter for therapeutic drug level monitoring: Secondary | ICD-10-CM

## 2014-09-20 DIAGNOSIS — I5032 Chronic diastolic (congestive) heart failure: Secondary | ICD-10-CM

## 2014-09-20 DIAGNOSIS — G609 Hereditary and idiopathic neuropathy, unspecified: Secondary | ICD-10-CM

## 2014-09-20 DIAGNOSIS — K922 Gastrointestinal hemorrhage, unspecified: Secondary | ICD-10-CM

## 2014-09-20 DIAGNOSIS — I10 Essential (primary) hypertension: Secondary | ICD-10-CM

## 2014-09-20 DIAGNOSIS — Z7901 Long term (current) use of anticoagulants: Secondary | ICD-10-CM

## 2014-09-20 DIAGNOSIS — Z9861 Coronary angioplasty status: Secondary | ICD-10-CM

## 2014-09-20 DIAGNOSIS — I2510 Atherosclerotic heart disease of native coronary artery without angina pectoris: Secondary | ICD-10-CM

## 2014-09-20 DIAGNOSIS — Z86718 Personal history of other venous thrombosis and embolism: Secondary | ICD-10-CM

## 2014-09-20 LAB — CBC
HEMATOCRIT: 28.5 % — AB (ref 39.0–52.0)
HEMATOCRIT: 30.1 % — AB (ref 39.0–52.0)
HEMOGLOBIN: 9.6 g/dL — AB (ref 13.0–17.0)
Hemoglobin: 9.2 g/dL — ABNORMAL LOW (ref 13.0–17.0)
MCH: 29.8 pg (ref 26.0–34.0)
MCH: 29.4 pg (ref 26.0–34.0)
MCHC: 32.3 g/dL (ref 30.0–36.0)
MCHC: 31.9 g/dL (ref 30.0–36.0)
MCV: 92.2 fL (ref 78.0–100.0)
MCV: 92.3 fL (ref 78.0–100.0)
PLATELETS: 195 10*3/uL (ref 150–400)
Platelets: 213 10*3/uL (ref 150–400)
RBC: 3.09 MIL/uL — AB (ref 4.22–5.81)
RBC: 3.26 MIL/uL — AB (ref 4.22–5.81)
RDW: 20.8 % — ABNORMAL HIGH (ref 11.5–15.5)
RDW: 20.5 % — ABNORMAL HIGH (ref 11.5–15.5)
WBC: 4.9 10*3/uL (ref 4.0–10.5)
WBC: 5.2 10*3/uL (ref 4.0–10.5)

## 2014-09-20 LAB — LUPUS ANTICOAGULANT PANEL
DRVVT: 49.7 s — AB (ref <42.9)
LUPUS ANTICOAGULANT: NOT DETECTED
PTT Lupus Anticoagulant: 45.5 secs — ABNORMAL HIGH (ref 28.0–43.0)
PTTLA 4:1 Mix: 36.6 secs (ref 28.0–43.0)
dRVVT Incubated 1:1 Mix: 36.7 secs (ref <42.9)

## 2014-09-20 LAB — COMPREHENSIVE METABOLIC PANEL
ALK PHOS: 89 U/L (ref 39–117)
ALT: 33 U/L (ref 0–53)
ANION GAP: 12 (ref 5–15)
AST: 24 U/L (ref 0–37)
Albumin: 3.2 g/dL — ABNORMAL LOW (ref 3.5–5.2)
BUN: 14 mg/dL (ref 6–23)
CO2: 24 mEq/L (ref 19–32)
Calcium: 8.8 mg/dL (ref 8.4–10.5)
Chloride: 103 mEq/L (ref 96–112)
Creatinine, Ser: 0.99 mg/dL (ref 0.50–1.35)
GFR calc non Af Amer: 73 mL/min — ABNORMAL LOW (ref >90)
GFR, EST AFRICAN AMERICAN: 85 mL/min — AB (ref >90)
GLUCOSE: 96 mg/dL (ref 70–99)
Potassium: 4 mEq/L (ref 3.7–5.3)
Sodium: 139 mEq/L (ref 137–147)
Total Bilirubin: 0.5 mg/dL (ref 0.3–1.2)
Total Protein: 6.3 g/dL (ref 6.0–8.3)

## 2014-09-20 LAB — FACTOR 8 ASSAY: COAGULATION FACTOR VIII: 410 % — AB (ref 73–140)

## 2014-09-20 LAB — PROTIME-INR
INR: 2.22 — ABNORMAL HIGH (ref 0.00–1.49)
Prothrombin Time: 24.6 seconds — ABNORMAL HIGH (ref 11.6–15.2)

## 2014-09-20 MED ORDER — FLEET ENEMA 7-19 GM/118ML RE ENEM
1.0000 | ENEMA | Freq: Once | RECTAL | Status: DC
  Filled 2014-09-20: qty 1

## 2014-09-20 MED ORDER — PEG-KCL-NACL-NASULF-NA ASC-C 100 G PO SOLR
0.5000 | Freq: Once | ORAL | Status: AC
Start: 2014-09-20 — End: 2014-09-20
  Administered 2014-09-20: 100 g via ORAL
  Filled 2014-09-20 (×2): qty 1

## 2014-09-20 MED ORDER — FLEET ENEMA 7-19 GM/118ML RE ENEM
1.0000 | ENEMA | Freq: Once | RECTAL | Status: AC
  Administered 2014-09-21: 1 via RECTAL
  Filled 2014-09-20: qty 1

## 2014-09-20 MED ORDER — SODIUM CHLORIDE 0.9 % IV SOLN
INTRAVENOUS | Status: DC
  Administered 2014-09-21: via INTRAVENOUS
  Administered 2014-09-21: 500 mL via INTRAVENOUS

## 2014-09-20 MED ORDER — SODIUM CHLORIDE 0.9 % IV SOLN: INTRAVENOUS | Status: DC

## 2014-09-20 NOTE — Progress Notes (Signed)
     [facility] Progress Note  Subjective:   Feels well. No further BM since last pm. HgB  9.2 this a.m. INR 2.2   Objective:  Vital signs in last 24 hours: Temp:  [97.6 F (36.4 C)-98.1 F (36.7 C)] 97.7 F (36.5 C) (09/29 0339) Pulse Rate:  [57-70] 58 (09/29 0339) Resp:  [13-24] 20 (09/29 0339) BP: (111-132)/(42-49) 132/43 mmHg (09/29 0339) SpO2:  [94 %-100 %] 94 % (09/29 0339)   General:   Alert,  Well-developed, male   in NAD Heart:  Regular rate and rhythm; no murmurs Pulm;lungs clear to ausc Abdomen:  Soft,mild RLQ tenderness, nondistended. Normal bowel sounds, without guarding, and without rebound.   Extremities:  Without edema. Neurologic:  Alert and  oriented x4;  grossly normal neurologically. Psych:  Alert and cooperative. Normal mood and affect.  Intake/Output from previous day: 09/28 0701 - 09/29 0700 In: 200 [P.O.:200] Out: 800 [Urine:800] Intake/Output this shift: Total I/O In: 240 [P.O.:240] Out: -   Lab Results:  Recent Labs  09/18/14 1611  09/19/14 0509 09/19/14 1710 09/20/14 0301  WBC 6.1  --   --  5.1 4.9  HGB 9.3*  < > 8.9* 9.1* 9.2*  HCT 29.5*  < > 27.8* 28.0* 28.5*  PLT 200  --   --  196 195  < > = values in this interval not displayed. BMET  Recent Labs  09/18/14 1611 09/20/14 0301  NA 142 139  K 4.4 4.0  CL 107 103  CO2 22 24  GLUCOSE 104* 96  BUN 17 14  CREATININE 1.05 0.99  CALCIUM 9.3 8.8   LFT  Recent Labs  09/20/14 0301  PROT 6.3  ALBUMIN 3.2*  AST 24  ALT 33  ALKPHOS 89  BILITOT 0.5   PT/INR  Recent Labs  09/18/14 1611 09/20/14 0301  LABPROT 28.3* 24.6*  INR 2.65* 2.22*   Hepatitis Panel No results found for this basename: HEPBSAG, HCVAB, HEPAIGM, HEPBIGM,  in the last 72 hours  No results found.  ASSESSMENT/PLAN:   GI bleed. Currently HD stable. Will review with Dr Hilarie Fredrickson regarding possible flex sig.     LOS: 2 days   Drevon Plog, Vita Barley PA-C 09/20/2014, Pager  [phone]  Addendum: Rev pt with Dr Hilarie Fredrickson. Will chedule for flex sig tomorrow and have pt ready for pill capsule enteroscopy afterwards if needed.

## 2014-09-20 NOTE — Progress Notes (Signed)
Patient seen, examined, and I agree with the above documentation, including the assessment and plan. Discussed case again with Dr. Carlean Purl No further bowel movements today therefore I do not think he has had further significant bleeding Plan flexible sigmoidoscopy tomorrow with possible anoscopy to exclude outlet bleeding/hemorrhoids. If all negative then proceed directly to capsule endoscopy Discussed with patient who understands and agrees He does need Plavix on an ongoing basis per Dr. Aundra Dubin given PCI

## 2014-09-20 NOTE — Progress Notes (Signed)
Subjective: No complaints.  Up washing in the sink  Objective: Vital signs in last 24 hours: Temp:  [97.6 F (36.4 C)-98.1 F (36.7 C)] 97.7 F (36.5 C) (09/29 0339) Pulse Rate:  [57-70] 58 (09/29 0339) Resp:  [13-24] 20 (09/29 0339) BP: (111-132)/(42-49) 132/43 mmHg (09/29 0339) SpO2:  [94 %-100 %] 94 % (09/29 0339)    Intake/Output from previous day: 09/28 0701 - 09/29 0700 In: 200 [P.O.:200] Out: 800 [Urine:800] Intake/Output this shift: Total I/O In: 240 [P.O.:240] Out: -   Medications Current Facility-Administered Medications  Medication Dose Route Frequency Provider Last Rate Last Dose  . 0.9 %  sodium chloride infusion   Intravenous Continuous Delfina Redwood, MD      . acetaminophen (TYLENOL) tablet 650 mg  650 mg Oral Q6H PRN Delfina Redwood, MD       Or  . acetaminophen (TYLENOL) suppository 650 mg  650 mg Rectal Q6H PRN Delfina Redwood, MD      . allopurinol (ZYLOPRIM) tablet 300 mg  300 mg Oral Daily Delfina Redwood, MD   300 mg at 09/20/14 0900  . clopidogrel (PLAVIX) tablet 75 mg  75 mg Oral BH-q7a Delfina Redwood, MD   75 mg at 09/20/14 0859  . nitroGLYCERIN (NITROSTAT) SL tablet 0.4 mg  0.4 mg Sublingual Q5 min PRN Delfina Redwood, MD      . ondansetron [employer]) tablet 4 mg  4 mg Oral Q6H PRN Delfina Redwood, MD       Or  . ondansetron (ZOFRAN) injection 4 mg  4 mg Intravenous Q6H PRN Delfina Redwood, MD      . pantoprazole (PROTONIX) EC tablet 40 mg  40 mg Oral QHS Delfina Redwood, MD   40 mg at 09/19/14 2209  . peg 3350 powder (MOVIPREP) kit 100 g  0.5 kit Oral Once Lori P Hvozdovic, PA-C      . sodium chloride 0.9 % injection 3 mL  3 mL Intravenous Q12H Delfina Redwood, MD   3 mL at 09/18/14 2242  . [START ON 09/21/2014] sodium phosphate (FLEET) 7-19 GM/118ML enema 1 enema  1 enema Rectal Once Lori P Hvozdovic, PA-C      . [START ON 09/21/2014] sodium phosphate (FLEET) 7-19 GM/118ML enema 1 enema  1 enema Rectal Once Lori P  Hvozdovic, PA-C        PE: General appearance: alert, cooperative and no distress Lungs: clear to auscultation bilaterally Heart: regular rate and rhythm, S1, S2 normal, no murmur, click, rub or gallop Extremities: No LEE Pulses: 2+ and symmetric Skin: WArm and dry Neurologic: Grossly normal  Lab Results:   Recent Labs  09/18/14 1611  09/19/14 0509 09/19/14 1710 09/20/14 0301  WBC 6.1  --   --  5.1 4.9  HGB 9.3*  < > 8.9* 9.1* 9.2*  HCT 29.5*  < > 27.8* 28.0* 28.5*  PLT 200  --   --  196 195  < > = values in this interval not displayed. BMET  Recent Labs  09/18/14 1611 09/20/14 0301  NA 142 139  K 4.4 4.0  CL 107 103  CO2 22 24  GLUCOSE 104* 96  BUN 17 14  CREATININE 1.05 0.99  CALCIUM 9.3 8.8   PT/INR  Recent Labs  09/18/14 1611 09/20/14 0301  LABPROT 28.3* 24.6*  INR 2.65* 2.22*     Assessment/Plan    Principal Problem:   Rectal bleeding Active Problems:   CAD S/P  LAD DES after [facility] 06/22/14   Portal vein thrombosis   HYPERTENSION, BENIGN   Peripheral neuropathy   History of DVT (deep vein thrombosis)   Chronic diastolic CHF (congestive heart failure)   Iron deficiency anemia   Esophageal varices without mention of bleeding   Warfarin anticoagulation  Plan: Looks good. Hgb continues to be stable 8.9>>9.1>>9.2.    ASA stopped.  On plavix and coumadin with INR of 2.2.  Flex sig tomorrow.    LOS: 2 days    Kashana Breach PA-C 09/20/2014 11:42 AM

## 2014-09-20 NOTE — Progress Notes (Addendum)
[name] - [facility] / ICU Progress Note  [name] LEX:[id number] DOB: 12/11/1928 [name]: 09/18/2014 PCP: [name], MD  Brief narrative: 80 year old WM PMHx anticoagulated with Xarelto in December 2014 for acute left lower extremity DVT for a 6 month course. Subsequent GI bleeding in July of 2015 lead to w/u which revealed portal hypertension and nonbleeding esophageal varices. At that time it was noted he had an acute versus subacute occlusion of both left and right portal veins. D-dimer was elevated at 6.08 therefore Hematology opted to resume anticoagulation with Lovenox > Coumadin. In addition he underwent cardiac catheterization with placement of drug-eluting stent to the proximal CAD on 7/[name]/15. Because of this he was placed on Plavix. He had previously been on aspirin as well but this was discontinued with the initiation of Coumadin. He presented to the emergency department on 9/27 with complaints of rectal bleeding. He'd had 2 episodes since onset 24 hours prior to presentation  In the ER the physician noted dark red stool on rectal exam. He was hemodynamically stable. Hemoglobin was stable around 9.3. No orthostatic symptoms.   Since admission patient has had a recurrence of rectal bleeding. I have also consulted hematology who recommended to continue holding Coumadin and to give a one-time dose of vitamin K 2.5 mg orally also recommended to transfuse to keep hemoglobin greater than or equal to 8. Given concerns over recent drug-eluting stent placement requiring Plavix cardiology has also been consulted. They agree with stopping aspirin and Coumadin. At present it is felt that the risk-to-benefit ratio favors continuing Plavix documenting that with recent drug-eluting stent all antiplatelet therapy were stopped he would be at risk for subacute thrombosis of the stent that would likely result in a large anterior MI. If the patient has a larger bleed we need to reassess continuing  Plavix.  HPI/Subjective: Denied further blood in stool or dark stools.  Assessment/Plan:    Chronic Iron deficiency anemia with likely acute blood loss component Agree with Hematologist that should transfuse to keep hemoglobin greater than or equal to 8.0 - baseline hemoglobin appears to be around 10 - serial CBC-current hemoglobin remained stable at 9.2   Rectal bleeding/recent EGD with Esophageal varices without mention of bleeding Source of bleeding unclear - given appearance of blood doubtful is upper - recent colonoscopy with only a few small polyps which were not biopsied therefore a source does not appear to be colonic in nature - after my discussion with Dr. Beryle Beams we both suspect patient likely has small bowel AVMs - holding anticoagulation except for Plavix-GI plans flexible sigmoidoscopy on 9/30-no further bleeding as of 9/29    Portal vein thrombosis / history of left lower extremity DVT Appreciate Hematologist assistance - previous coagulopathic workup negative per hematologist - if his source of bleeding can be identified and managed suspect will need lifelong anticoagulation - per Hematologist no indication to pursue IVC filter in setting of portal vein thrombosis      Chronic diastolic CHF  Compensated - blood pressure soft in setting of acute blood loss - discontinued Lasix and potassium    CAD S/P LAD DES after [facility] 7/[name]/15 Appreciate Cardiology assistance - continue Plavix for now due to risk for acute stent thrombosis that would lead to large anterior MI - okay to hold aspirin     Warfarin anticoagulation Warfarin on hold - vitamin K 2.5 mg by mouth given 9/28 at request of Hematology-INR still 2.0    HYPERTENSION, BENIGN Current blood  pressure soft in setting of acute blood loss anemia-Lasix and Micardis on hold    Peripheral neuropathy   DVT prophylaxis: SCDs Code Status: Full Family Communication: No family at bedside Disposition Plan/Expected LOS: Stepdown    Consultants: Hematology Gastroenterology Cardiology  Procedures: None  Cultures: None  Antibiotics: None  Objective: Blood pressure 106/56, pulse 63, temperature 97.3 F (36.3 C), temperature source Oral, resp. rate 18, height 5' 11" ([name].803 m), weight 270 lb 15.[name] oz (122.9 kg), SpO2 100.00%.  Intake/Output Summary (Last 24 hours) at 09/20/14 1333 Last data filed at 09/20/14 0912  Gross per 24 hour  Intake    240 ml  Output    450 ml  Net   -210 ml    Exam: Gen: No acute respiratory distress Chest: Clear to auscultation bilaterally without wheezes, rhonchi or crackles, room air Cardiac: Regular rate and rhythm, S1-S2, no rubs murmurs or gallops, no peripheral edema Abdomen: Soft nontender nondistended without obvious hepatosplenomegaly, no ascites Extremities: Symmetrical in appearance without cyanosis, clubbing or effusion  Scheduled Meds:  Scheduled Meds: . allopurinol  300 mg Oral Daily  . clopidogrel  75 mg Oral BH-q7a  . pantoprazole  40 mg Oral QHS  . peg 3350 powder  0.5 kit Oral Once  . sodium chloride  3 mL Intravenous Q12H  . [START ON 09/21/2014] sodium phosphate  [name] enema Rectal Once  . [START ON 09/21/2014] sodium phosphate  [name] enema Rectal Once   Data Reviewed: Basic Metabolic Panel:  Recent Labs Lab 09/18/14 1611 09/20/14 0301  NA 142 139  K 4.4 4.0  CL 107 103  CO2 22 24  GLUCOSE 104* 96  BUN 17 14  CREATININE [name].05 0.99  CALCIUM 9.3 8.8   Liver Function Tests:  Recent Labs Lab 09/18/14 1611 09/20/14 0301  AST 24 24  ALT 39 33  ALKPHOS 97 89  BILITOT 0.3 0.5  PROT 6.6 6.3  ALBUMIN 3.5 3.2*   CBC:  Recent Labs Lab 09/18/14 1611 09/18/14 2209 09/19/14 0509 09/19/14 1710 09/20/14 0301  WBC 6.[name]  --   --  5.[name] 4.9  NEUTROABS 3.6  --   --   --   --   HGB 9.3* 8.6* 8.9* 9.[name]* 9.2*  HCT 29.5* 27.0* 27.8* 28.0* 28.5*  MCV 92.5  --   --  92.[name] 92.2  PLT 200  --   --  196 195    Recent Results (from the past 240 hour(s))  MRSA  PCR SCREENING     Status: None   Collection Time    09/19/14  6:40 AM      Result Value Ref Range Status   MRSA by PCR NEGATIVE  NEGATIVE Final   Comment:            The GeneXpert MRSA Assay (FDA     approved for NASAL specimens     only), is one component of a     comprehensive MRSA colonization     surveillance program. It is not     intended to diagnose MRSA     infection nor to guide or     monitor treatment for     MRSA infections.     Studies:  Recent x-ray studies have been reviewed in detail by the Attending Physician  Time spent :  [place], ANP [facility] Office  [phone] Pager [phone]  On-Call/Text Page:      [email or web address]      password [email or web address]  If  7PM-7AM, please contact night-coverage [email or web address] Password [email or web address] 09/20/2014, [name]:33 PM   LOS: 2 days  Examined patient and discussed assessment and plan and he Ebony Hail and agree with above. Patient with multiple complex medical problems> 40 mins spent in direct patient care

## 2014-09-20 NOTE — Progress Notes (Signed)
Patient ID: James [name], male   DOB: 1928/05/11, 80 y.o.   MRN: [id number] Hemoglobin remains stable without need for transfusion. Vague RLQ discomfort on deep palpation of abdomen. He passed some melena yesterday.  No BM yet today. INR 2.2 despite holding coumadin and giving 2 mg of oral Vitamin K Per Dr Vena Rua note:I would favor doing flex sigmoidoscopy while he is hospitalized to see if we can document any pathology given critical decisions that have to be made re anticoagulants and anti-platelet agents.  If this is negative, I feel we should do capsule endoscopy.

## 2014-09-20 NOTE — Progress Notes (Signed)
Patient seen and examined.  Off ASA and now on Plavix and Coumadin with stable Hbg.  For flex sig tomorrow.

## 2014-09-21 ENCOUNTER — Encounter (HOSPITAL_COMMUNITY)
Admission: EM | Disposition: A | Discharge status: Home / Self Care | Payer: Self-pay | Source: Home / Self Care | Attending: Internal Medicine

## 2014-09-21 ENCOUNTER — Encounter (HOSPITAL_COMMUNITY): Payer: Medicare Other

## 2014-09-21 ENCOUNTER — Encounter (HOSPITAL_COMMUNITY): Payer: Self-pay | Admitting: Gastroenterology

## 2014-09-21 ENCOUNTER — Ambulatory Visit: Payer: Medicare Other | Admitting: Gastroenterology

## 2014-09-21 DIAGNOSIS — D126 Benign neoplasm of colon, unspecified: Secondary | ICD-10-CM

## 2014-09-21 DIAGNOSIS — K644 Residual hemorrhoidal skin tags: Secondary | ICD-10-CM

## 2014-09-21 DIAGNOSIS — I2510 Atherosclerotic heart disease of native coronary artery without angina pectoris: Secondary | ICD-10-CM

## 2014-09-21 DIAGNOSIS — D509 Iron deficiency anemia, unspecified: Secondary | ICD-10-CM

## 2014-09-21 DIAGNOSIS — I10 Essential (primary) hypertension: Secondary | ICD-10-CM

## 2014-09-21 HISTORY — PX: GIVENS CAPSULE STUDY: SHX5432

## 2014-09-21 HISTORY — PX: FLEXIBLE SIGMOIDOSCOPY: SHX5431

## 2014-09-21 LAB — BASIC METABOLIC PANEL
Anion gap: 13 (ref 5–15)
BUN: 12 mg/dL (ref 6–23)
CALCIUM: 8.8 mg/dL (ref 8.4–10.5)
CHLORIDE: 104 meq/L (ref 96–112)
CO2: 22 mEq/L (ref 19–32)
CREATININE: 0.93 mg/dL (ref 0.50–1.35)
GFR calc non Af Amer: 75 mL/min — ABNORMAL LOW (ref >90)
GFR, EST AFRICAN AMERICAN: 87 mL/min — AB (ref >90)
Glucose, Bld: 96 mg/dL (ref 70–99)
Potassium: 4.1 mEq/L (ref 3.7–5.3)
Sodium: 139 mEq/L (ref 137–147)

## 2014-09-21 LAB — CBC
HCT: 28.8 % — ABNORMAL LOW (ref 39.0–52.0)
Hemoglobin: 9.3 g/dL — ABNORMAL LOW (ref 13.0–17.0)
MCH: 30.7 pg (ref 26.0–34.0)
MCHC: 32.3 g/dL (ref 30.0–36.0)
MCV: 95 fL (ref 78.0–100.0)
Platelets: 193 10*3/uL (ref 150–400)
RBC: 3.03 MIL/uL — ABNORMAL LOW (ref 4.22–5.81)
RDW: 20.7 % — AB (ref 11.5–15.5)
WBC: 5.8 10*3/uL (ref 4.0–10.5)

## 2014-09-21 SURGERY — IMAGING PROCEDURE, GI TRACT, INTRALUMINAL, VIA CAPSULE
Anesthesia: LOCAL

## 2014-09-21 SURGERY — SIGMOIDOSCOPY, FLEXIBLE
Anesthesia: Moderate Sedation

## 2014-09-21 MED ORDER — HYDROCORTISONE ACE-PRAMOXINE 1-1 % RE FOAM
1.0000 | Freq: Three times a day (TID) | RECTAL | Status: DC
  Administered 2014-09-21 – 2014-09-24 (×10): 1 via RECTAL
  Filled 2014-09-21 (×3): qty 10

## 2014-09-21 MED ORDER — MIDAZOLAM HCL 5 MG/ML IJ SOLN
INTRAMUSCULAR | Status: AC
  Filled 2014-09-21: qty 2

## 2014-09-21 MED ORDER — HYDROCORTISONE ACE-PRAMOXINE 2.5-1 % RE CREA
TOPICAL_CREAM | Freq: Three times a day (TID) | RECTAL | Status: DC
  Filled 2014-09-21: qty 30

## 2014-09-21 MED ORDER — CLOPIDOGREL BISULFATE 75 MG PO TABS
75.0000 mg | ORAL_TABLET | ORAL | Status: DC
  Administered 2014-09-21 – 2014-09-25 (×5): 75 mg via ORAL
  Filled 2014-09-21 (×5): qty 1

## 2014-09-21 MED ORDER — CLOPIDOGREL BISULFATE 75 MG PO TABS
75.0000 mg | ORAL_TABLET | Freq: Every day | ORAL | Status: DC
  Filled 2014-09-21: qty 1

## 2014-09-21 MED ORDER — FENTANYL CITRATE 0.05 MG/ML IJ SOLN
INTRAMUSCULAR | Status: DC | PRN
  Administered 2014-09-21 (×2): 25 ug via INTRAVENOUS

## 2014-09-21 MED ORDER — FENTANYL CITRATE 0.05 MG/ML IJ SOLN
INTRAMUSCULAR | Status: AC
  Filled 2014-09-21: qty 2

## 2014-09-21 MED ORDER — DIPHENHYDRAMINE HCL 50 MG/ML IJ SOLN
INTRAMUSCULAR | Status: AC
  Filled 2014-09-21: qty 1

## 2014-09-21 MED ORDER — HYDROCORTISONE ACE-PRAMOXINE 1-1 % RE CREA
TOPICAL_CREAM | Freq: Three times a day (TID) | RECTAL | Status: DC
Start: 2014-09-21 — End: 2014-09-21
  Filled 2014-09-21: qty 30

## 2014-09-21 MED ORDER — MIDAZOLAM HCL 10 MG/2ML IJ SOLN
INTRAMUSCULAR | Status: DC | PRN
  Administered 2014-09-21: 2 mg via INTRAVENOUS
  Administered 2014-09-21: 1 mg via INTRAVENOUS
  Administered 2014-09-21: 2 mg via INTRAVENOUS

## 2014-09-21 SURGICAL SUPPLY — 1 items: TOWEL COTTON PACK 4EA (MISCELLANEOUS) ×4 IMPLANT

## 2014-09-21 NOTE — Progress Notes (Signed)
Patient Alert and oriented to unit. Patient denies pain and have GI camera attached.  Sister is at bedside.

## 2014-09-21 NOTE — Progress Notes (Signed)
[facility]  [name] FVC:[id number] DOB: 1928-11-23 [name]: 09/18/2014 PCP: [name], MD  Brief narrative: 80 year old M Hx anticoagulated with Xarelto in December 2014 for acute left lower extremity DVT for a 6 month course. Subsequent GI bleeding in July of 2015 lead to w/u which revealed portal hypertension and nonbleeding esophageal varices. At that time it was noted he had an acute versus subacute occlusion of both left and right portal veins. D-dimer was elevated at 6.08 therefore Hematology opted to resume anticoagulation with Lovenox > Coumadin. In addition he underwent cardiac catheterization with placement of drug-eluting stent to the proximal CAD on 06/22/14. Because of this he was placed on Plavix. He had previously been on aspirin as well but this was discontinued with the initiation of Coumadin. He presented to the emergency department on 9/27 with complaints of rectal bleeding. He'd had 2 episodes since onset 24 hours prior to presentation  In the ER the physician noted dark red stool on rectal exam. He was hemodynamically stable. Hemoglobin was stable around 9.3. No orthostatic symptoms.   Since admission patient has had a recurrence of rectal bleeding. We consulted Hematology who recommended to continue holding Coumadin and to give a one-time dose of vitamin K 2.5 mg orally also recommended to transfuse to keep hemoglobin greater than or equal to 8. Given concerns over recent drug-eluting stent placement requiring Plavix Cardiology was also consulted. They agreed with stopping aspirin and Coumadin. At present it is felt that the risk-to-benefit ratio favors continuing Plavix. If the patient has a larger bleed will need to reassess continuing Plavix.  Patient underwent flexible sigmoidoscopy on 9/30. External hemorrhoids were noted. Capsule endoscopy also completed to ensure no small bowel AVMs, w/ results pending. Coumadin remains on hold  until results of capsule endoscopy available.  HPI/Subjective: No further bleeding and or melena. No dominant pain. Patient wishes to defer initiation of outpatient cardiac rehabilitation until after October 10  Assessment/Plan:    Chronic Iron deficiency anemia with likely acute blood loss component Agree with Hematologist that should transfuse to keep hemoglobin greater than or equal to 8.0 - baseline hemoglobin appears to be around 10 - serial CBC - current hemoglobin remains stable at 9.3   Rectal bleeding/recent EGD with Esophageal varices without mention of bleeding Source of bleeding unclear - given appearance of blood doubtful is upper - recent colonoscopy with only a few small polyps which were not biopsied therefore a source does not appear to be colonic in nature - after my discussion with Dr. Beryle Beams we both suspect patient likely has small bowel AVMs - holding anticoagulation except for Plavix - GI  flexible sigmoidoscopy on 9/30 revealed external hemorrhoids; Analpram initiated - capsule endoscopy in process to rule out AVMs - see anticoag plan as per Hematology note today    Portal vein thrombosis / history of left lower extremity DVT Appreciate Hematologist assistance - previous coagulopathic workup negative per Hematologist - source of bleeding attempting to be localized pending results of capsule endoscopy - current INR still greater than 2 off of warfarin - per Hematologist no indication to pursue IVC filter in setting of portal vein thrombosis      Chronic diastolic CHF  Compensated - blood pressure soft in setting of acute blood loss - discontinued Lasix and potassium    CAD S/P LAD DES after [facility] 06/22/14 Appreciate Cardiology assistance - continue Plavix for now due to risk for acute stent thrombosis  that would lead to large anterior MI - holding aspirin - home statin is Livalo    Warfarin anticoagulation Warfarin on hold - vitamin K 2.5 mg by mouth given 9/28 at  request of Hematology-INR still 2.0    HYPERTENSION, BENIGN Blood pressure remains soft in setting of acute blood loss anemia - continue to hold Lasix and Micardis     Peripheral neuropathy   DVT prophylaxis: SCDs Code Status: Full Family Communication: Multiple family members at bedside including daughter Disposition Plan/Expected LOS: Transfer to floor - discharge disposition pending results of capsule endoscopy and resumption of warfarin  Consultants: Hematology Gastroenterology Cardiology  Procedures: None  Cultures: None  Antibiotics: None  Objective: Blood pressure 121/53, pulse 64, temperature 97.6 F (36.4 C), temperature source Oral, resp. rate 14, height 5\' 11"  (1.803 m), weight 270 lb (122.471 kg), SpO2 93.00%.  Intake/Output Summary (Last 24 hours) at 09/21/14 1236 Last data filed at 09/21/14 0400  Gross per 24 hour  Intake  75.67 ml  Output      0 ml  Net  75.67 ml    Exam: Gen: No acute respiratory distress Chest: Clear to auscultation bilaterally, room air Cardiac: Regular rate and rhythm, S1-S2, no rubs murmurs or gallops, no peripheral edema Abdomen: Soft nontender nondistended without obvious hepatosplenomegaly, no ascites Extremities: Symmetrical in appearance without cyanosis, clubbing or effusion  Scheduled Meds:  Scheduled Meds: . allopurinol  300 mg Oral Daily  . clopidogrel  75 mg Oral BH-q7a  . hydrocortisone-pramoxine  1 applicator Rectal TID  . pantoprazole  40 mg Oral QHS  . sodium chloride  3 mL Intravenous Q12H  . sodium phosphate  1 enema Rectal Once   Data Reviewed: Basic Metabolic Panel:  Recent Labs Lab 09/18/14 1611 09/20/14 0301 09/21/14 0326  NA 142 139 139  K 4.4 4.0 4.1  CL 107 103 104  CO2 22 24 22   GLUCOSE 104* 96 96  BUN 17 14 12   CREATININE 1.05 0.99 0.93  CALCIUM 9.3 8.8 8.8   Liver Function Tests:  Recent Labs Lab 09/18/14 1611 09/20/14 0301  AST 24 24  ALT 39 33  ALKPHOS 97 89  BILITOT 0.3  0.5  PROT 6.6 6.3  ALBUMIN 3.5 3.2*   CBC:  Recent Labs Lab 09/18/14 1611  09/19/14 0509 09/19/14 1710 09/20/14 0301 09/20/14 1700 09/21/14 0326  WBC 6.1  --   --  5.1 4.9 5.2 5.8  NEUTROABS 3.6  --   --   --   --   --   --   HGB 9.3*  < > 8.9* 9.1* 9.2* 9.6* 9.3*  HCT 29.5*  < > 27.8* 28.0* 28.5* 30.1* 28.8*  MCV 92.5  --   --  92.1 92.2 92.3 95.0  PLT 200  --   --  196 195 213 193  < > = values in this interval not displayed.  Recent Results (from the past 240 hour(s))  MRSA PCR SCREENING     Status: None   Collection Time    09/19/14  6:40 AM      Result Value Ref Range Status   MRSA by PCR NEGATIVE  NEGATIVE Final   Comment:            The GeneXpert MRSA Assay (FDA     approved for NASAL specimens     only), is one component of a     comprehensive MRSA colonization     surveillance program. It is not  intended to diagnose MRSA     infection nor to guide or     monitor treatment for     MRSA infections.     Studies:  Recent x-ray studies have been reviewed in detail by the Attending Physician  Time spent :  [place], ANP [facility] Office  [phone] Pager [phone]  On-Call/Text Page:      [email or web address]      password [email or web address]  If 7PM-7AM, please contact night-coverage [email or web address] Password [email or web address] 09/21/2014, 12:36 PM   LOS: 3 days   I have personally examined this patient and reviewed the entire database. I have reviewed the above note, made any necessary editorial changes, and agree with its content.  Cherene Altes, MD [facility]

## 2014-09-21 NOTE — Progress Notes (Signed)
Patient Name: James [name] Date of Encounter: 09/21/2014     Principal Problem:   Rectal bleeding Active Problems:   HYPERTENSION, BENIGN   Peripheral neuropathy   History of DVT (deep vein thrombosis)   Chronic diastolic CHF (congestive heart failure)   Iron deficiency anemia   CAD S/P LAD DES after FFR 06/22/14   Esophageal varices without mention of bleeding   Portal vein thrombosis   Warfarin anticoagulation   External hemorrhoids    SUBJECTIVE  Denies any CP or SOB.  CURRENT MEDS . allopurinol  300 mg Oral Daily  . clopidogrel  75 mg Oral BH-q7a  . hydrocortisone-pramoxine  1 applicator Rectal TID  . pantoprazole  40 mg Oral QHS  . sodium chloride  3 mL Intravenous Q12H  . sodium phosphate  1 enema Rectal Once    OBJECTIVE  Filed Vitals:   09/21/14 0920 09/21/14 0930 09/21/14 0940 09/21/14 1002  BP: 107/32 98/37 106/39   Pulse: 57 58    Temp:      TempSrc:      Resp: 14 14    Height:    5\' 11"  (1.803 m)  Weight:    270 lb (122.471 kg)  SpO2: 97% 99%      Intake/Output Summary (Last 24 hours) at 09/21/14 1149 Last data filed at 09/21/14 0400  Gross per 24 hour  Intake  75.67 ml  Output      0 ml  Net  75.67 ml   Filed Weights   09/19/14 0545 09/21/14 1002  Weight: 270 lb 15.1 oz (122.9 kg) 270 lb (122.471 kg)    PHYSICAL EXAM  General: Pleasant, NAD. Neuro: Alert and oriented X 3. Moves all extremities spontaneously. Psych: Normal affect. HEENT:  Normal  Neck: Supple without bruits or JVD. Lungs:  Resp regular and unlabored, CTA. Heart: RRR no s3, s4, or murmurs. Abdomen: Soft, non-tender, non-distended, BS + x 4.  Extremities: No clubbing, cyanosis or edema. DP/PT/Radials 2+ and equal bilaterally.  Accessory Clinical Findings  CBC  Recent Labs  09/18/14 1611  09/20/14 1700 09/21/14 0326  WBC 6.1  < > 5.2 5.8  NEUTROABS 3.6  --   --   --   HGB 9.3*  < > 9.6* 9.3*  HCT 29.5*  < > 30.1* 28.8*  MCV 92.5  < > 92.3 95.0  PLT 200  <  > 213 193  < > = values in this interval not displayed. Basic Metabolic Panel  Recent Labs  09/20/14 0301 09/21/14 0326  NA 139 139  K 4.0 4.1  CL 103 104  CO2 24 22  GLUCOSE 96 96  BUN 14 12  CREATININE 0.99 0.93  CALCIUM 8.8 8.8   Liver Function Tests  Recent Labs  09/18/14 1611 09/20/14 0301  AST 24 24  ALT 39 33  ALKPHOS 97 89  BILITOT 0.3 0.5  PROT 6.6 6.3  ALBUMIN 3.5 3.2*    TELE NSR with HR 70s, no significant ventricular ectopy    ECG  No new EKG  Echocardiogram 12/08/2013  LV EF: 60% - 65%  ------------------------------------------------------------ Indications: Edema 782.3. Shortness of breath 786.05.  ------------------------------------------------------------ History: PMH: Acquired from the patient and from the patient's chart. Dyspnea, exertional dyspnea, and bilateral lower extremity edema. Coronary artery disease. Congestive heart failure. The dysfunction is primarily diastolic. Cerebrovascular disease. Deep vein thrombosis. Risk factors: Hypertension. Morbidly obese.  ------------------------------------------------------------ Study Conclusions  - Left ventricle: The cavity size was normal. There was mild concentric  hypertrophy. Systolic function was normal. The estimated ejection fraction was in the range of 60% to 65%. Wall motion was normal; there were no regional wall motion abnormalities. Doppler parameters are consistent with abnormal left ventricular relaxation (grade 1 diastolic dysfunction). - Aortic valve: Sclerosis without stenosis. - Left atrium: The atrium was mildly dilated.      Radiology/Studies  US Abdomen Limited  08/26/2014   CLINICAL DATA:  Abnormal portal vein on MRI  EXAM: DUPLEX ULTRASOUND OF LIVER  TECHNIQUE: Color and duplex Doppler ultrasound was performed to evaluate the hepatic in-flow and out-flow vessels.  COMPARISON:  MRI of the abdomen 08/20/2014; CT abdomen 08/11/2014  FINDINGS: Portal Vein  Velocities  Main:  Occluded  Right:  Occluded  Left:  Occluded  Hepatic Vein Velocities  Right:  37 cm/sec  Middle:  22 cm/sec  Left:  24 cm/sec  Hepatic Artery Velocity:  108 cm/sec  Splenic Vein Velocity:  17 cm/sec  Varices: None  Ascites: Trace perihepatic ascites  Gallbladder: Numerous tiny echogenic foci throughout the gallbladder consistent with sludge versus small stones. No gallbladder wall thickening or pericholecystic fluid. Per the sonographer, the sonographic Percell Miller sign is negative.  Common bile duct:  Within normal limits at 6 mm  Liver: 2.1 x 2.7 x 2.0 cm circumscribed hyperechoic mass in the right hepatic lobe corresponds with a hemangioma seen on the recent cross-sectional imaging studies.  IMPRESSION: 1. Complete occlusion of the main, right and left portal veins. The main portal vein is expanded with low-level internal echoes suggestive of acute thrombus. 2. 2.7 cm hemangioma in the right hemi liver. 3. Gallbladder sludge and/or small stones. No secondary sonographic findings to suggest acute cholecystitis. 4. Trace perihepatic ascites may be related to acute/subacute portal venous thrombosis. 5. The splenic vein remains patent. Signed,  Criselda Peaches, MD  [facility]  [facility]   Electronically Signed   By: Jacqulynn Cadet M.D.   On: 08/26/2014 14:37   [place] Art/ven Flow Abd Pelv Doppler  08/26/2014   CLINICAL DATA:  Abnormal portal vein on MRI  EXAM: DUPLEX ULTRASOUND OF LIVER  TECHNIQUE: Color and duplex Doppler ultrasound was performed to evaluate the hepatic in-flow and out-flow vessels.  COMPARISON:  MRI of the abdomen 08/20/2014; CT abdomen 08/11/2014  FINDINGS: Portal Vein Velocities  Main:  Occluded  Right:  Occluded  Left:  Occluded  Hepatic Vein Velocities  Right:  37 cm/sec  Middle:  22 cm/sec  Left:  24 cm/sec  Hepatic Artery Velocity:  108 cm/sec  Splenic Vein Velocity:  17 cm/sec  Varices: None  Ascites: Trace perihepatic  ascites  Gallbladder: Numerous tiny echogenic foci throughout the gallbladder consistent with sludge versus small stones. No gallbladder wall thickening or pericholecystic fluid. Per the sonographer, the sonographic Percell Miller sign is negative.  Common bile duct:  Within normal limits at 6 mm  Liver: 2.1 x 2.7 x 2.0 cm circumscribed hyperechoic mass in the right hepatic lobe corresponds with a hemangioma seen on the recent cross-sectional imaging studies.  IMPRESSION: 1. Complete occlusion of the main, right and left portal veins. The main portal vein is expanded with low-level internal echoes suggestive of acute thrombus. 2. 2.7 cm hemangioma in the right hemi liver. 3. Gallbladder sludge and/or small stones. No secondary sonographic findings to suggest acute cholecystitis. 4. Trace perihepatic ascites may be related to acute/subacute portal venous thrombosis. 5. The splenic vein remains patent. Signed,  Criselda Peaches, MD  [facility]  [facility]   Electronically Signed   By: Jacqulynn Cadet M.D.   On: 08/26/2014 14:37    ASSESSMENT AND PLAN  1. Rectal bleeding  - hgb stable 9.1 --> 9.2 --> 9.6 --> 9.3  - s/p recent endoscopy in July revealed portal HTN and nonbleeding esophageal varices  - s/p flex sig 09/21/2014 normal colon mucosa, sessile polyps in ascending colon, small external hemorrhoid  - IM suspect small bowel AVMs, pending video capsule endoscopy for completeness  - Continue plavix and resume coumadin as soon as cleared by GI  2. CAD S/P LAD DES after FFR 06/22/14  3. Portal vein thrombosis  4. HYPERTENSION, BENIGN  5. Peripheral neuropathy  6. History of DVT (deep vein thrombosis)   - on coumadin 7. Chronic diastolic CHF (congestive heart failure)  8. Iron deficiency anemia  9. Esophageal varices without mention of bleeding   Signed, Almyra Deforest PA-C Pager: [phone]  Patient seen with PA, agree with the above note.  No further  bleeding.  Flex sig with nonbleeding hemorrhoids, pending capsule endoscopy completion.  If no AVMs noted on capsule, plan to continue Plavix + coumadin INR 2-2.5.  If AVMs noted, plan Plavix + ASA 81.   Loralie Champagne 09/21/2014 5:32 PM

## 2014-09-21 NOTE — Op Note (Signed)
[facility] [place], [address]   FLEXIBLE SIGMOIDOSCOPY PROCEDURE REPORT  PATIENT: James [name], James [name]  MR#: [id number] BIRTHDATE: 1928-02-02 , 70  yrs. old GENDER: male ENDOSCOPIST: Jerene Bears, MD PROCEDURE DATE:  09/21/2014 PROCEDURE:   Sigmoidoscopy (though to the ascending colon), diagnostic; anoscopy ASA CLASS:   Class III INDICATIONS: rectal bleeding, persistent anemia, need for anti-platelet and anti-thrombotic therapy MEDICATIONS: Fentanyl 50 mcg IV and Versed 5 mg IV  DESCRIPTION OF PROCEDURE:   After the risks benefits and alternatives of the procedure were thoroughly explained, informed consent was obtained.  Digital exam revealed small external hemorrhoids. Anoscopy was performed before the procedure which revealed external hemorrhoids, no internal hemorrhoids were seen.The EC-[id number] ([id number])  endoscope was introduced through the anus  and advanced to the distal ascending colon where semi-formed stool was encountered . The exam was Without limitations.    The quality of the prep was excellent. .  The instrument was then slowly withdrawn as the mucosa was fully examined.     COLON FINDINGS: A sessile polyp measuring 5 mm in size was found in the ascending colon. Polypectomy not attempted due to small size of the polyp and anticoagulation in the setting of recent bleeding. The colon mucosa was otherwise normal with no evidence of inflammation, diverticulosis, or angiodysplastic lesions.  No blood was seen.  Retroflexed views revealed small external hemorrhoids. The scope was then withdrawn from the patient and the procedure terminated.  COMPLICATIONS: There were no immediate complications.  ENDOSCOPIC IMPRESSION: 1.   Sessile polyp was found in the ascending colon 2.   The colon mucosa was otherwise normal  RECOMMENDATIONS: Monitor Hgb.  Analpram three times daily for external hemorrhoids. Proceed to video capsule  endoscopy for completeness   eSigned:  Jerene Bears, MD 09/21/2014 9:00 AM   CC:The Patient Silvano Rusk, MD Murriel Hopper, MD Loralie Champagne, MD

## 2014-09-21 NOTE — Progress Notes (Signed)
Patient ID: James [name], male   DOB: 1928-07-01, 80 y.o.   MRN: [id number] Flexible sigmoidoscopy with non bleeding external hemorrhoids, single sessile polyp, no mucosal bleeding or ulceration. Capsule endoscopy in progress. Hb remains stable. If we determine that he has small bowel AVMs, I would not resume coumadin but add ASA to his Plavix. If no small bowel pathology, then resume coumadin 5 mg daily to keep INR 2-2.5. Discussed with patient and his daughter.

## 2014-09-22 ENCOUNTER — Inpatient Hospital Stay (HOSPITAL_COMMUNITY): Payer: Medicare Other

## 2014-09-22 ENCOUNTER — Encounter (HOSPITAL_COMMUNITY): Payer: Self-pay | Admitting: Internal Medicine

## 2014-09-22 DIAGNOSIS — I5032 Chronic diastolic (congestive) heart failure: Secondary | ICD-10-CM

## 2014-09-22 DIAGNOSIS — D126 Benign neoplasm of colon, unspecified: Secondary | ICD-10-CM

## 2014-09-22 DIAGNOSIS — K625 Hemorrhage of anus and rectum: Secondary | ICD-10-CM

## 2014-09-22 DIAGNOSIS — Z7902 Long term (current) use of antithrombotics/antiplatelets: Secondary | ICD-10-CM

## 2014-09-22 DIAGNOSIS — D509 Iron deficiency anemia, unspecified: Secondary | ICD-10-CM

## 2014-09-22 DIAGNOSIS — Z7901 Long term (current) use of anticoagulants: Secondary | ICD-10-CM

## 2014-09-22 DIAGNOSIS — R269 Unspecified abnormalities of gait and mobility: Secondary | ICD-10-CM

## 2014-09-22 DIAGNOSIS — K922 Gastrointestinal hemorrhage, unspecified: Secondary | ICD-10-CM

## 2014-09-22 DIAGNOSIS — I10 Essential (primary) hypertension: Secondary | ICD-10-CM

## 2014-09-22 DIAGNOSIS — Z5181 Encounter for therapeutic drug level monitoring: Secondary | ICD-10-CM

## 2014-09-22 LAB — CBC
HCT: 27 % — ABNORMAL LOW (ref 39.0–52.0)
Hemoglobin: 8.7 g/dL — ABNORMAL LOW (ref 13.0–17.0)
MCH: 29.8 pg (ref 26.0–34.0)
MCHC: 32.2 g/dL (ref 30.0–36.0)
MCV: 92.5 fL (ref 78.0–100.0)
Platelets: 196 10*3/uL (ref 150–400)
RBC: 2.92 MIL/uL — ABNORMAL LOW (ref 4.22–5.81)
RDW: 20 % — ABNORMAL HIGH (ref 11.5–15.5)
WBC: 4.1 10*3/uL (ref 4.0–10.5)

## 2014-09-22 MED ORDER — MAGNESIUM HYDROXIDE 400 MG/5ML PO SUSP
30.0000 mL | Freq: Every day | ORAL | Status: DC | PRN
  Administered 2014-09-23: 30 mL via ORAL
  Filled 2014-09-22: qty 30

## 2014-09-22 MED ORDER — DOCUSATE SODIUM 100 MG PO CAPS
100.0000 mg | ORAL_CAPSULE | Freq: Every day | ORAL | Status: DC
  Administered 2014-09-22 – 2014-09-23 (×2): 100 mg via ORAL
  Filled 2014-09-22 (×2): qty 1

## 2014-09-22 NOTE — Progress Notes (Signed)
Patient seen, examined, and I agree with the above documentation, including the assessment and plan. No further rectal bleeding Should have capsule study results tomorrow morning to help make decisions regarding warfarin therapy

## 2014-09-22 NOTE — Progress Notes (Signed)
Daily Rounding Note  09/22/2014, 10:05 AM  LOS: 4 days   SUBJECTIVE:       Feels well.  Capsule apparatus retrieved this AM.  Eating well.  "blow out" of brown stool yesterday.  No BM since.  Not dizzy or dyspneic.   OBJECTIVE:         Vital signs in last 24 hours:    Temp:  [97.1 F (36.2 C)-98 F (36.7 C)] 98 F (36.7 C) (10/01 0700) Pulse Rate:  [62-69] 66 (10/01 0700) Resp:  [14-18] 18 (10/01 0700) BP: (111-124)/(47-57) 111/52 mmHg (10/01 0700) SpO2:  [93 %-100 %] 100 % (10/01 0700) Weight:  [122.471 kg (270 lb)] 122.471 kg (270 lb) (10/01 0700) Last BM Date: 09/21/14 Filed Weights   09/21/14 1002 09/22/14 0545 09/22/14 0700  Weight: 122.471 kg (270 lb) 122.471 kg (270 lb) 122.471 kg (270 lb)   General: looks well. Obese. Comfortable   Heart: RRR. Chest: clear bil.   Abdomen: soft, NT, obese.  activeBS  Extremities: no CCE Neuro/Psych:  Pleasant, garrulous.  Oriented x 3.  No tremor or limb weakness.   Intake/Output from previous day: 09/30 0701 - 10/01 0700 In: 240 [P.O.:240] Out: -   Intake/Output this shift: Total I/O In: 720 [P.O.:720] Out: -   Lab Results:  Recent Labs  09/20/14 1700 09/21/14 0326 09/22/14 0725  WBC 5.2 5.8 4.1  HGB 9.6* 9.3* 8.7*  HCT 30.1* 28.8* 27.0*  PLT 213 193 196   BMET  Recent Labs  09/20/14 0301 09/21/14 0326  NA 139 139  K 4.0 4.1  CL 103 104  CO2 24 22  GLUCOSE 96 96  BUN 14 12  CREATININE 0.99 0.93  CALCIUM 8.8 8.8   LFT  Recent Labs  09/20/14 0301  PROT 6.3  ALBUMIN 3.2*  AST 24  ALT 33  ALKPHOS 89  BILITOT 0.5   PT/INR  Recent Labs  09/20/14 0301  LABPROT 24.6*  INR 2.22*    Scheduled Meds: . allopurinol  300 mg Oral Daily  . clopidogrel  75 mg Oral BH-q7a  . hydrocortisone-pramoxine  1 applicator Rectal TID  . pantoprazole  40 mg Oral QHS  . sodium chloride  3 mL Intravenous Q12H  . sodium phosphate  1 enema Rectal Once     Continuous Infusions: . sodium chloride     PRN Meds:.acetaminophen, acetaminophen, nitroGLYCERIN, ondansetron (ZOFRAN) IV, ondansetron  ASSESMENT:   *  Bloody stool/hematochezia.  Sigmoidoscopy 9/30.  Sessile polyp in ascending colon was left in place and not biopsied due to recent bleeding and small size of polyp.  Small external hemorrhoids currently treated with Analpram cream. .  EGD 08/08/14 with small esophageal varices, portal hypertensive gastropathy.  Varices small size did not require banding.    *  Chronic Coumadin, Plavix, ASA PTA S/p DES to prox LAD 06/2014  and hx DVT 10/2013.  PV occlusion 08/2013.    *  Portal vein occlusion. CT of 08/11/14 liver showed non-specific lesion in segment 6 as well as right hepatic hemangioma noted.   Liver not described as cirrhotic.   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 Sluggish flow vs chronic PV occlusion.  Cirrhosis not apparent.  Small pancreatic cystic lesions, likely small pseudocysts.  08/26/14 abdominal ultrasound with occluded main, right and left portal veins and likely acute thrombus.  Patent splenic vein. GB sludge and  small stones.  2.7 mm right liver hemangioma.  Trace perihepatic ascites.    *   Acute on chronic anemia. Normocytic.     PLAN   *  Read capsule endo, aim is to get this completed this afternoon.  *  For now plavix, ASA.  Coumadin remains on hold.  Dr Azucena Freed rec is to restart this if no SB pathology seen on CE.     Azucena Freed  09/22/2014, 10:06 AM Pager: [phone]

## 2014-09-22 NOTE — Progress Notes (Signed)
Pt seen and chart reviewed. Floor RN paged this NP regarding increasing abdominal distention over the fast few hours. Family is concerned the Hgb has dropped to 8.7. NP present with pt and family to assess. Exam as below and provided reassurance to family.   S: Pt and wife report increasing significant abdominal distention; denies pain, no BM x 48hrs, has not passed the GI camera (per report from pt).   O: BSAx4 quadrants, WNL, NTTP, no masses, no hepatosplenomegaly, palpation consistent with significant amount of gas in bowel, loud tympany present in right mid and upper abdominal regions.   A: Exam consistent with gas/constipation; SBO unlikely, but plan below to r/o this and other differentials. GI on board.   P: Will continue colace as prescribed earlier; add milk of mag 7ml daily PRN (give 1 now)      RN paged GI on call, GI had RN order DG ABD 2 View KUB Upper and Flat and page them with results   Aundreya Souffrant, Elyse Jarvis, FNP-BC

## 2014-09-22 NOTE — Progress Notes (Addendum)
Pt with complaint of abdominal distention during shift change. Pt and daughter requesting to speak with a physician. Paged provider on call, awaiting further orders. Ordered for pt to get a 100mg  colace.  NP on call at bedside to assist pt.

## 2014-09-22 NOTE — Care Management Note (Signed)
    Page 1 of 1   09/22/2014     2:15:49 PM CARE MANAGEMENT NOTE 09/22/2014  Patient:  James [name], James [name]   Account Number:  [id number]  Date Initiated:  09/19/2014  Documentation initiated by:  [facility]  Subjective/Objective Assessment:   GI bleed     Action/Plan:   lives at home with dtr, James [name]   Anticipated DC Date:  09/23/2014   Anticipated DC Plan:  [place]  CM consult      Choice offered to / List presented to:             Status of service:  Completed, signed off Medicare Important Message given?  YES (If response is "NO", the following Medicare IM given date fields will be blank) Date Medicare IM given:  09/22/2014 Medicare IM given by:  Tomi Bamberger Date Additional Medicare IM given:   Additional Medicare IM given by:    Discharge Disposition:    Per UR Regulation:  Reviewed for med. necessity/level of care/duration of stay  If discussed at [place] of Stay Meetings, dates discussed:    Comments:  09/19/2014 1100 NCM spoke to pt and gave permission to speak with dtr, James [name]. Dtr states home is handicap accessible and they have DME (RW, wheelchair) at home. Waiting final recommendations for home. Jonnie Finner RN CCM Case Mgmt phone [phone]

## 2014-09-22 NOTE — Progress Notes (Signed)
PROGRESS NOTE  BERNARDINO DOWELL ZDG:[id number] DOB: Sep 07, 1928 [name]: 09/18/2014 PCP: [name], MD   HPI/Subjective: On 10/1, the patient states he is comfortable.  He had an "explosive" spontaneous bowel movement on the afternoon of 9/30.  There was no blood seen in this bowel movement.  He has not yet defecated today.  Upon exam this morning, the video capsule endoscopy camera had not been picked up by GI.     Interim summary:  Patient is a 80 yo male with a history of DVT (11/2013), portal vein thrombosis, CAD with a DES placed 06/2014, esophageal varices who was anticoagulated at home with Coumadin, Plavix, and Aspirin, presented to the ED with rectal bleeding.  He was admitted for a further workup.    Assessment/Plan: Chronic Iron deficiency anemia with likely acute blood loss component  - Per Hematologist, will transfuse to keep hemoglobin greater than or equal to 8.0  - Baseline hemoglobin appears to be around 10  - Serial CBC  - Current hemoglobin is 8.7 on morning of 10/1.  Continue to monitor closely.  Rectal bleeding/recent EGD with Esophageal varices without mention of bleeding  - Presented with bloody stools, source of bleeding unclear. - Given appearance of blood, upper GI source is doubtful - Upper EGD (08/08/2014) showed esophageal varices without bleeding. - Recent colonoscopy (08/08/2014) with only a few small polyps which were not biopsied therefore a source does not appear to be colonic in nature  - Dr. Beryle Beams ([facility]) suspects source is likely small bowel AVMs  - Holding anticoagulation except for Plavix due to DES placement (06/2014)  - GI flexible sigmoidoscopy on 9/30 revealed external hemorrhoids; Proctofoam-HC initiated on 9/30 - Capsule endoscopy pending to rule out AVMs  - Per Heme, if small bowel AVMs present, will not resume coumadin but add ASA to Plavix.  If no small bowel pathology, will resume coumadin 5 mg daily and maintain INR 2-2.5.  Portal vein  thrombosis / history of left lower extremity DVT  - Previous coagulopathic workup negative. - Source of bleeding attempting to be localized pending results of capsule endoscopy  - Current INR still greater than 2 off of warfarin (9/29). - Per Hematologist no indication to pursue IVC filter in setting of portal vein thrombosis   Chronic diastolic CHF  - Compensated  - Blood pressure soft in setting of acute blood loss  - Discontinued Lasix and Potassium   CAD S/P LAD DES after FFR 06/22/14  - Appreciate Cardiology assistance  - Continue Plavix for now due to risk for acute stent thrombosis that would lead to large anterior MI  - Holding aspirin  - Home statin is Livalo - held for now.  Warfarin anticoagulation  - Warfarin on hold  - Vitamin K 2.5 mg by mouth given 9/28 at request of Hematology - INR still 2.22 on 9/29. - Recheck INR on 10/2.   HYPERTENSION, BENIGN  - Blood pressure remains soft in setting of acute blood loss anemia  - Continue to hold Lasix and Micardis   DVT Prophylaxis:  SCDs  Code Status: Full Family Communication: No family at bedside.  Patient is alert and awake.  Very pleasant, competent, and agreeable to plan. Disposition Plan: Remain inpatient.  Disposition pending results of capsule endoscopy and coumadin resumption  Consultants:  Hematology  Cardiology  Gastroenterology  Objective: Filed Vitals:   09/21/14 1608 09/21/14 2100 09/22/14 0545 09/22/14 0700  BP:  124/57 122/47 111/52  Pulse:  69 62 66  Temp:  97.1 F (36.2 C) 97.6 F (36.4 C) 97.8 F (36.6 C) 98 F (36.7 C)  TempSrc: Oral Oral Oral Oral  Resp:  18 18 18   Height:      Weight:   122.471 kg (270 lb) 122.471 kg (270 lb)  SpO2:  100% 99% 100%    Intake/Output Summary (Last 24 hours) at 09/22/14 1039 Last data filed at 09/22/14 0900  Gross per 24 hour  Intake    960 ml  Output      0 ml  Net    960 ml   Filed Weights   09/21/14 1002 09/22/14 0545 09/22/14 0700  Weight:  122.471 kg (270 lb) 122.471 kg (270 lb) 122.471 kg (270 lb)    Exam: General:  Pleasant well developed, well nourished elderly male, NAD, appears stated age  30:  Anicteic Sclera, MMM.  Neck is supple. Cardiovascular: RRR, S1 S2 auscultated, no rubs, murmurs or gallops.   Respiratory: Clear to auscultation bilaterally with equal chest rise  Abdomen: Soft, nontender, nondistended, + bowel sounds.  No ascites.  Extremities: Lower extremities warm dry without cyanosis clubbing or edema.  Neuro: AAOx3, cranial nerves grossly intact. Strength 5/5 in upper and lower extremities  Psych: Normal affect and demeanor with intact judgement and insight  Data Reviewed: Basic Metabolic Panel:  Recent Labs Lab 09/18/14 1611 09/20/14 0301 09/21/14 0326  NA 142 139 139  K 4.4 4.0 4.1  CL 107 103 104  CO2 22 24 22   GLUCOSE 104* 96 96  BUN 17 14 12   CREATININE 1.05 0.99 0.93  CALCIUM 9.3 8.8 8.8   Liver Function Tests:  Recent Labs Lab 09/18/14 1611 09/20/14 0301  AST 24 24  ALT 39 33  ALKPHOS 97 89  BILITOT 0.3 0.5  PROT 6.6 6.3  ALBUMIN 3.5 3.2*   CBC:  Recent Labs Lab 09/18/14 1611  09/19/14 1710 09/20/14 0301 09/20/14 1700 09/21/14 0326 09/22/14 0725  WBC 6.1  --  5.1 4.9 5.2 5.8 4.1  NEUTROABS 3.6  --   --   --   --   --   --   HGB 9.3*  < > 9.1* 9.2* 9.6* 9.3* 8.7*  HCT 29.5*  < > 28.0* 28.5* 30.1* 28.8* 27.0*  MCV 92.5  --  92.1 92.2 92.3 95.0 92.5  PLT 200  --  196 195 213 193 196  < > = values in this interval not displayed.   Recent Results (from the past 240 hour(s))  MRSA PCR SCREENING     Status: None   Collection Time    09/19/14  6:40 AM      Result Value Ref Range Status   MRSA by PCR NEGATIVE  NEGATIVE Final   Comment:            The GeneXpert MRSA Assay (FDA     approved for NASAL specimens     only), is one component of a     comprehensive MRSA colonization     surveillance program. It is not     intended to diagnose MRSA     infection nor  to guide or     monitor treatment for     MRSA infections.     Scheduled Meds: . allopurinol  300 mg Oral Daily  . clopidogrel  75 mg Oral BH-q7a  . hydrocortisone-pramoxine  1 applicator Rectal TID  . pantoprazole  40 mg Oral QHS  . sodium chloride  3 mL Intravenous Q12H  . sodium  phosphate  1 enema Rectal Once   Continuous Infusions: . sodium chloride      Principal Problem:   Rectal bleeding Active Problems:   HYPERTENSION, BENIGN   Peripheral neuropathy   History of DVT (deep vein thrombosis)   Chronic diastolic CHF (congestive heart failure)   Iron deficiency anemia   CAD S/P LAD DES after FFR 06/22/14   Esophageal varices without mention of bleeding   Portal vein thrombosis   Warfarin anticoagulation   External hemorrhoids   Rockwell Germany, PA-S2 [facility] Pager [phone]. If 7PM-7AM, please contact night-coverage at [email or web address], password [facility] 09/22/2014, 10:39 AM  LOS: 4 days    Addendum  Patient seen and examined, chart and data base reviewed.  I agree with the above assessment and plan.  For full details please see Mrs. Rockwell Germany, PA-S2 note.  I reviewed an amended the above note as appropriate.   Birdie Hopes, MD [facility] Pager: [phone] 09/22/2014, 11:58 AM

## 2014-09-23 ENCOUNTER — Encounter (HOSPITAL_COMMUNITY): Payer: Medicare Other

## 2014-09-23 DIAGNOSIS — I8500 Esophageal varices without bleeding: Secondary | ICD-10-CM

## 2014-09-23 DIAGNOSIS — I81 Portal vein thrombosis: Secondary | ICD-10-CM

## 2014-09-23 DIAGNOSIS — K648 Other hemorrhoids: Secondary | ICD-10-CM

## 2014-09-23 LAB — CBC
HCT: 26.2 % — ABNORMAL LOW (ref 39.0–52.0)
Hemoglobin: 8.4 g/dL — ABNORMAL LOW (ref 13.0–17.0)
MCH: 30.3 pg (ref 26.0–34.0)
MCHC: 32.1 g/dL (ref 30.0–36.0)
MCV: 94.6 fL (ref 78.0–100.0)
Platelets: 176 10*3/uL (ref 150–400)
RBC: 2.77 MIL/uL — ABNORMAL LOW (ref 4.22–5.81)
RDW: 19.9 % — AB (ref 11.5–15.5)
WBC: 4.2 10*3/uL (ref 4.0–10.5)

## 2014-09-23 LAB — PROTIME-INR
INR: 1.47 (ref 0.00–1.49)
PROTHROMBIN TIME: 17.8 s — AB (ref 11.6–15.2)

## 2014-09-23 LAB — PREPARE RBC (CROSSMATCH)

## 2014-09-23 MED ORDER — POLYETHYLENE GLYCOL 3350 17 G PO PACK
17.0000 g | PACK | Freq: Every day | ORAL | Status: DC
  Administered 2014-09-25: 17 g via ORAL
  Filled 2014-09-23 (×2): qty 1

## 2014-09-23 MED ORDER — SODIUM CHLORIDE 0.9 % IV SOLN
Freq: Once | INTRAVENOUS | Status: AC
  Administered 2014-09-23: 10 mL/h via INTRAVENOUS

## 2014-09-23 MED ORDER — FUROSEMIDE 10 MG/ML IJ SOLN
40.0000 mg | Freq: Once | INTRAMUSCULAR | Status: AC
  Administered 2014-09-23: 40 mg via INTRAVENOUS
  Filled 2014-09-23: qty 4

## 2014-09-23 NOTE — Progress Notes (Signed)
PROGRESS NOTE  James [name] FTD:[id number] DOB: March 01, 1928 [name]: 09/18/2014 PCP: James Stain, MD  HPI/Subjective: Patient is a 80 yo male with a history of DVT (11/2013), portal vein thrombosis, CAD with a DES placed 06/2014, esophageal varices who was anticoagulated at home with Coumadin, Plavix, and Aspirin, presented to the ED with rectal bleeding. He was admitted for a further workup.   On 10/2, the patient defecated for the first time since 9/30.   The stool was dark and tarry.  He states he experiences some dyspnea on exertion with walking or talking.  Assessment/Plan:  Chronic Iron deficiency anemia with likely acute blood loss component  - Per Hematologist, will transfuse to keep hemoglobin greater than or equal to 8.0  - Baseline hemoglobin appears to be around 10  - Serial CBC  - Current hemoglobin is 8.4 on morning of 10/2. Trending downward. - Patient complains of DOE.  Will transfuse one unit of PRBC on 10/2. - Continue to monitor closely.   Rectal bleeding/recent EGD with Esophageal varices without mention of bleeding  - Presented with bloody stools, source of bleeding unclear.  - Given appearance of blood, upper GI source is doubtful - Upper EGD (08/08/2014) showed esophageal varices without bleeding.  - Recent colonoscopy (8/17) with only a few small polyps which were not biopsied therefore a source does not appear to be colonic in nature  - James [name] (Heme) suspects source is likely small bowel AVMs  - Holding anticoagulation except for Plavix due to DES placement (06/2014)  - GI flexible sigmoidoscopy on 9/30 revealed external hemorrhoids; Proctofoam-HC initiated on 9/30  - Patient had bowel movement - dark, tarry stool on 10/2.   - Capsule endoscopy pending to rule out AVMs  - Per Heme, if small bowel AVMs present, will not resume coumadin but add ASA to Plavix. If no small bowel pathology, will resume coumadin 5 mg daily and maintain INR 2-2.5.    Constipation - Patient appears to have distended nontender abdomen on morning of 10/2.   - He was given stool softeners.  - Passed dark tarry stool on 10/2.   - Continue to monitor.  Portal vein thrombosis / history of left lower extremity DVT  - Previous coagulopathic workup negative.  - Source of bleeding attempting to be localized pending results of capsule endoscopy  - Current INR 1.47 (10/2) while only taking Plavix.  Awaiting capsule EGD results to determine need for warfarin.  - Per Hematologist no indication to pursue IVC filter in setting of portal vein thrombosis   Chronic diastolic CHF  - Compensated  - Blood pressure ~120/50 in setting of acute blood loss  - Continue to hold Lasix and Potassium   CAD S/P LAD DES after FFR 06/22/14  - Appreciate Cardiology assistance  - Continue Plavix for now due to risk for acute stent thrombosis that would lead to large anterior MI  - Holding aspirin  - Home statin is Livalo - held for now.   Warfarin anticoagulation  - Warfarin on hold  - Vitamin K 2.5 mg by mouth given 9/28 at request of Hematology  - INR is 2.22 on 9/29.  1.47 on 10/2.    - Awaiting report from Capsule EGD to determine resumption of warfarin per Hematology.  HYPERTENSION, BENIGN  - Blood pressure remains controlled in setting of acute blood loss anemia  - Continue to hold Lasix and Micardis   DVT Prophylaxis:  SCDs  Code Status: Full Family Communication: None at  bedside.  Patient is alert, awake, and agreeable to plan. Disposition Plan: Remain inpatient.  Likely home tomorrow.  Consultants:  Hematology  Cardiology  Gastroenterology  Procedures:  Flex Sigmoidoscopy  Video Capsule Endoscopy  Tranfuse 1 unit PRBC on 10/2   Objective: Filed Vitals:   09/22/14 1305 09/22/14 2046 09/22/14 2100 09/23/14 0453  BP: 128/45  125/52 99/44  Pulse: 69  68 62  Temp: 97.8 F (36.6 C)  97.4 F (36.3 C) 99 F (37.2 C)  TempSrc: Oral  Oral Oral   Resp: 16  18 16   Height:      Weight:  122.108 kg (269 lb 3.2 oz)  120.8 kg (266 lb 5.1 oz)  SpO2: 100%  100% 97%    Intake/Output Summary (Last 24 hours) at 09/23/14 1309 Last data filed at 09/23/14 1023  Gross per 24 hour  Intake    540 ml  Output      0 ml  Net    540 ml   Filed Weights   09/22/14 0700 09/22/14 2046 09/23/14 0453  Weight: 122.471 kg (270 lb) 122.108 kg (269 lb 3.2 oz) 120.8 kg (266 lb 5.1 oz)    Exam: General: Pleasant obese elderly male sitting in chair, NAD, appears stated age  44:  Anicteic Sclera, MMM. Neck is supple, no JVD, no masses.  Cardiovascular: RRR, S1 S2 auscultated, no rubs, murmurs or gallops.   Respiratory: Clear to auscultation bilaterally with equal chest rise.  No wheezes or rales.    Abdomen: Soft, distended, nontender, + bowel sounds  Extremities: warm dry without cyanosis clubbing or edema.  Neuro: AAOx3, cranial nerves grossly intact. Strength 5/5 in upper and lower extremities  Psych: Normal affect and demeanor with intact judgement and insight  Data Reviewed: Basic Metabolic Panel:  Recent Labs Lab 09/18/14 1611 09/20/14 0301 09/21/14 0326  NA 142 139 139  K 4.4 4.0 4.1  CL 107 103 104  CO2 22 24 22   GLUCOSE 104* 96 96  BUN 17 14 12   CREATININE 1.05 0.99 0.93  CALCIUM 9.3 8.8 8.8   Liver Function Tests:  Recent Labs Lab 09/18/14 1611 09/20/14 0301  AST 24 24  ALT 39 33  ALKPHOS 97 89  BILITOT 0.3 0.5  PROT 6.6 6.3  ALBUMIN 3.5 3.2*   CBC:  Recent Labs Lab 09/18/14 1611  09/20/14 0301 09/20/14 1700 09/21/14 0326 09/22/14 0725 09/23/14 0519  WBC 6.1  < > 4.9 5.2 5.8 4.1 4.2  NEUTROABS 3.6  --   --   --   --   --   --   HGB 9.3*  < > 9.2* 9.6* 9.3* 8.7* 8.4*  HCT 29.5*  < > 28.5* 30.1* 28.8* 27.0* 26.2*  MCV 92.5  < > 92.2 92.3 95.0 92.5 94.6  PLT 200  < > 195 213 193 196 176  < > = values in this interval not displayed.  BNP (last 3 results)  Recent Labs  12/22/13 0741 05/29/14 1910  06/06/14 1244  PROBNP 27.0 36.3 83.0    Recent Results (from the past 240 hour(s))  MRSA PCR SCREENING     Status: None   Collection Time    09/19/14  6:40 AM      Result Value Ref Range Status   MRSA by PCR NEGATIVE  NEGATIVE Final   Comment:            The GeneXpert MRSA Assay (FDA     approved for NASAL specimens  only), is one component of a     comprehensive MRSA colonization     surveillance program. It is not     intended to diagnose MRSA     infection nor to guide or     monitor treatment for     MRSA infections.     Studies: Dg Abd 2 Views  09/22/2014   CLINICAL DATA:  80 year old male with abdominal distention, pain, nausea  EXAM: ABDOMEN - 2 VIEW  COMPARISON:  Prior CT 08/11/2014  FINDINGS: Portions of the left and right abdomen have been excluded given the patient's habitus.  Gas within small bowel and colon.  No abnormal distention.  Electronic radiopacity projects over the anatomic pelvis, likely representing capsule endoscopy within the rectum.  No free air visualized.  No unexpected calcifications.  No displaced fracture.  Unremarkable appearance of the lung bases.  IMPRESSION: Unremarkable bowel gas pattern. There appears to be a capsule endoscopy device within the rectum.  Signed,  Dulcy Fanny. Earleen Newport, DO  [facility]  [facility]   Electronically Signed   By: Corrie Mckusick O.D.   On: 09/22/2014 22:32    Scheduled Meds: . sodium chloride   Intravenous Once  . allopurinol  300 mg Oral Daily  . clopidogrel  75 mg Oral BH-q7a  . docusate sodium  100 mg Oral Daily  . hydrocortisone-pramoxine  1 applicator Rectal TID  . pantoprazole  40 mg Oral QHS  . sodium chloride  3 mL Intravenous Q12H  . sodium phosphate  1 enema Rectal Once   Continuous Infusions: . sodium chloride      Principal Problem:   Rectal bleeding Active Problems:   HYPERTENSION, BENIGN   Peripheral neuropathy   History of DVT (deep vein  thrombosis)   Chronic diastolic CHF (congestive heart failure)   Iron deficiency anemia   CAD S/P LAD DES after FFR 06/22/14   Esophageal varices without mention of bleeding   Portal vein thrombosis   Warfarin anticoagulation   External hemorrhoids   James Germany, PA-S2 [facility] Pager [phone]. If 7PM-7AM, please contact night-coverage at [email or web address], password [facility] 09/23/2014, 1:09 PM  LOS: 5 days    Addendum  Patient seen and examined, chart and data base reviewed.  I agree with the above assessment and plan.  For full details please see Mrs. James Germany, PA-S2 note.  I reviewed and amended the above note as needed.   Birdie Hopes, MD [facility] Pager: [phone] 09/23/2014, 1:32 PM

## 2014-09-23 NOTE — Progress Notes (Signed)
Patient ID: James [name], male   DOB: Jul 04, 1928, 80 y.o.   MRN: [id number] Hemoglobin has drifted down to 8.4 No chest pain/pressure No BM since sigmoidoscopy; no hematochezia. Abddomen distended, non tender Capsule endo report should be out this AM: if negative: resume coumadin at 5 mg daily. Check PT/INR at [facility] coumadin clinic with Dr Elie Confer next Wed. He has F/U w Dr Carlean Purl on 10/23 and with me on 11/23. Note: he received 2 weekly doses of IV iron just prior to this admission - he doesn't tolerate/absorb PO. Discussed w Dr Hartford Poli

## 2014-09-23 NOTE — Progress Notes (Signed)
Notified Lynch, NP that pt and pt's daughter concerned that pt's abdomen is distended since yesterday. Pt's daughter is requesting for cardiology to see pt in the morning. Pt's daughter states that she is concerned that his abdomen being distended and low diastolic BP may be due to his congestive heart failure. Pt having no shortness of breath.  NP stated that she will let MD know of concern in the morning. Will continue to monitor pt. Ranelle Oyster, RN

## 2014-09-23 NOTE — Progress Notes (Addendum)
Progress Note   Subjective  One episode of dark nearly black stool Feels well, eating regular diet    Objective  Vital signs in last 24 hours: Temp:  [97.4 F (36.3 C)-99 F (37.2 C)] 97.4 F (36.3 C) (10/02 1638) Pulse Rate:  [62-71] 71 (10/02 1638) Resp:  [16-21] 20 (10/02 1638) BP: (99-139)/(38-58) 139/48 mmHg (10/02 1638) SpO2:  [97 %-100 %] 99 % (10/02 1638) Weight:  [266 lb 5.1 oz (120.8 kg)-269 lb 3.2 oz (122.108 kg)] 266 lb 5.1 oz (120.8 kg) (10/02 0453) Last BM Date: 09/21/14 Gen: awake, alert, NAD HEENT: anicteric, op clear CV: RRR, no mrg Pulm: CTA b/l Abd: soft, distended, NT, +BS throughout Ext: no c/c, 1+ edema Neuro: nonfocal  Intake/Output from previous day: 09-27-23 0701 - 10/02 0700 In: 966 [P.O.:960; I.V.:6] Out: -  Intake/Output this shift: Total I/O In: 300 [P.O.:300] Out: -   Lab Results:  Recent Labs  09/21/14 0326 2014/09/26 0725 09/23/14 0519  WBC 5.8 4.1 4.2  HGB 9.3* 8.7* 8.4*  HCT 28.8* 27.0* 26.2*  PLT 193 196 176   BMET  Recent Labs  09/21/14 0326  NA 139  K 4.1  CL 104  CO2 22  GLUCOSE 96  BUN 12  CREATININE 0.93  CALCIUM 8.8  PT/INR  Recent Labs  09/23/14 0519  LABPROT 17.8*  INR 1.47   Studies/Results: Dg Abd 2 Views  2014-09-26   CLINICAL DATA:  80 year old male with abdominal distention, pain, nausea  EXAM: ABDOMEN - 2 VIEW  COMPARISON:  Prior CT 08/11/2014  FINDINGS: Portions of the left and right abdomen have been excluded given the patient's habitus.  Gas within small bowel and colon.  No abnormal distention.  Electronic radiopacity projects over the anatomic pelvis, likely representing capsule endoscopy within the rectum.  No free air visualized.  No unexpected calcifications.  No displaced fracture.  Unremarkable appearance of the lung bases.  IMPRESSION: Unremarkable bowel gas pattern. There appears to be a capsule endoscopy device within the rectum.  Signed,  Dulcy Fanny. Earleen Newport, DO  [facility]  [facility]   Electronically Signed   By: Corrie Mckusick O.D.   On: 2014/09/26 22:32   VCE -- findings complete study, the preparation until the distal ileum and the lumen is obscured in part by debris. A few benign-appearing polyps in the jejunum, nonbleeding and small. Prominent lymphoid hyperplasia and edema in the distal ileum without obvious bleeding. No evidence of angiodysplastic lesions or active/recent bleeding.    Assessment & Plan  Bloody stools/hematochezia/anemia -- recent sigmoidoscopy unremarkable. No evidence of diverticulosis. Small external hemorrhoids. This is no longer been an issue. --Video capsule endoscopy as above. No significant bleeding lesion seen and no definite blood in the lumen of the small bowel --Will give an additional 1 unit packed red blood cells, okay for discharge from GI perspective after that --Office followup with Dr. Carlean Purl as scheduled, monitor hemoglobin, and iron studies as an outpatient  Portal vein thrombosis -- given lack of overt small bowel bleeding or bleeding lesion, will continue warfarin with target INR towards the low end of the therapeutic range per Dr. Beryle Beams (discussed with him by phone)  Portal hypertension -- secondary to portal vein clot, history of small varices not felt to have bled. Mild portal hypertensive gastropathy. Will be following up with Dr. Carlean Purl  Iron deficiency anemia -- monitored by hematology, recent IV iron infusion  CAD status post PCI -- he will continue  Plavix under the direction of his cardiologist Dr. Aundra Dubin  GI will sign off, call with questions. He has GI followup     Principal Problem:   Rectal bleeding Active Problems:   HYPERTENSION, BENIGN   Peripheral neuropathy   History of DVT (deep vein thrombosis)   Chronic diastolic CHF (congestive heart failure)   Iron deficiency anemia   CAD S/P LAD DES after FFR 06/22/14   Esophageal varices without  mention of bleeding   Portal vein thrombosis   Warfarin anticoagulation   External hemorrhoids     LOS: 5 days   Cameshia Cressman M  09/23/2014, 5:26 PM

## 2014-09-23 NOTE — Progress Notes (Signed)
Paged James [name] back about pt's KUB results. No further orders given

## 2014-09-24 ENCOUNTER — Inpatient Hospital Stay (HOSPITAL_COMMUNITY): Payer: Medicare Other

## 2014-09-24 DIAGNOSIS — I2510 Atherosclerotic heart disease of native coronary artery without angina pectoris: Secondary | ICD-10-CM

## 2014-09-24 DIAGNOSIS — Z9861 Coronary angioplasty status: Secondary | ICD-10-CM

## 2014-09-24 LAB — BASIC METABOLIC PANEL
ANION GAP: 10 (ref 5–15)
BUN: 13 mg/dL (ref 6–23)
CALCIUM: 8.6 mg/dL (ref 8.4–10.5)
CO2: 23 mEq/L (ref 19–32)
CREATININE: 0.96 mg/dL (ref 0.50–1.35)
Chloride: 104 mEq/L (ref 96–112)
GFR calc Af Amer: 86 mL/min — ABNORMAL LOW (ref >90)
GFR, EST NON AFRICAN AMERICAN: 74 mL/min — AB (ref >90)
Glucose, Bld: 103 mg/dL — ABNORMAL HIGH (ref 70–99)
Potassium: 3.9 mEq/L (ref 3.7–5.3)
SODIUM: 137 meq/L (ref 137–147)

## 2014-09-24 LAB — CBC
HCT: 26.5 % — ABNORMAL LOW (ref 39.0–52.0)
Hemoglobin: 8.7 g/dL — ABNORMAL LOW (ref 13.0–17.0)
MCH: 29.2 pg (ref 26.0–34.0)
MCHC: 32.8 g/dL (ref 30.0–36.0)
MCV: 88.9 fL (ref 78.0–100.0)
PLATELETS: 196 10*3/uL (ref 150–400)
RBC: 2.98 MIL/uL — AB (ref 4.22–5.81)
RDW: 20.8 % — ABNORMAL HIGH (ref 11.5–15.5)
WBC: 4.3 10*3/uL (ref 4.0–10.5)

## 2014-09-24 LAB — TYPE AND SCREEN
ABO/RH(D): A NEG
Antibody Screen: NEGATIVE
UNIT DIVISION: 0

## 2014-09-24 MED ORDER — FUROSEMIDE 10 MG/ML IJ SOLN
40.0000 mg | Freq: Once | INTRAMUSCULAR | Status: AC
  Administered 2014-09-24: 40 mg via INTRAVENOUS
  Filled 2014-09-24: qty 4

## 2014-09-24 MED ORDER — ATORVASTATIN CALCIUM 20 MG PO TABS
20.0000 mg | ORAL_TABLET | Freq: Every day | ORAL | Status: DC
  Administered 2014-09-24: 20 mg via ORAL
  Filled 2014-09-24 (×2): qty 1

## 2014-09-24 MED ORDER — IOHEXOL 300 MG/ML  SOLN
125.0000 mL | Freq: Once | INTRAMUSCULAR | Status: AC | PRN
  Administered 2014-09-24: 125 mL via INTRAVENOUS

## 2014-09-24 MED ORDER — IOHEXOL 300 MG/ML  SOLN
25.0000 mL | INTRAMUSCULAR | Status: AC
  Administered 2014-09-24 (×2): 25 mL via ORAL

## 2014-09-24 NOTE — Progress Notes (Signed)
PROGRESS NOTE  James [name]:[id number] DOB: May 07, 1928 [name]: 09/18/2014 PCP: [name], MD  HPI/Subjective: Patient is a 80 yo male with a history of DVT (11/2013), portal vein thrombosis, CAD with a DES placed 06/2014, esophageal varices who was anticoagulated at home with Coumadin, Plavix, and Aspirin, presented to the ED with rectal bleeding. He was admitted for a further workup.   Complains about some dyspnea especially with exertion. Also has abdominal distention, x-ray from yesterday showed no acute events. Likely to have some fluid overload, his oral Lasix on hold, IV Lasix given, follow renal function closely.  Assessment/Plan:  Chronic Iron deficiency anemia with likely acute blood loss component  - Per Hematologist, will transfuse to keep hemoglobin greater than or equal to 8.0  - Baseline hemoglobin appears to be around 10  - Serial CBC  - Current hemoglobin is 8.4 on morning of 10/2. Trending downward. - Patient complains of DOE.  Will transfuse one unit of PRBC on 10/2. - Continue to monitor closely.   Rectal bleeding/recent EGD with Esophageal varices without mention of bleeding  - Presented with bloody stools, source of bleeding unclear.  - Given appearance of blood, upper GI source is doubtful - Upper EGD (08/08/2014) showed esophageal varices without bleeding.  - Recent colonoscopy (8/17) with only a few small polyps which were not biopsied therefore a source does not appear to be colonic in nature  - Dr. Beryle Beams (Heme) suspects source is likely small bowel AVMs  - Holding anticoagulation except for Plavix due to DES placement (06/2014)  - GI flexible sigmoidoscopy on 9/30 revealed external hemorrhoids; Proctofoam-HC initiated on 9/30  - Patient had bowel movement - dark, tarry stool on 10/2.   - Capsule endoscopy pending to rule out AVMs  - Per Heme, if small bowel AVMs present, will not resume coumadin but add ASA to Plavix. If no small bowel pathology,  will resume coumadin 5 mg daily and maintain INR 2-2.5.   Constipation - Patient appears to have distended nontender abdomen on morning of 10/2.   - He was given stool softeners.  - Passed dark tarry stool on 10/2.   - X-ray done on 10/2, no acute events, repeat CT scan of abdomen/pelvis.  Portal vein thrombosis / history of left lower extremity DVT  - Previous coagulopathic workup negative.  - Source of bleeding attempting to be localized pending results of capsule endoscopy  - Current INR 1.47 (10/2) while only taking Plavix.  Awaiting capsule EGD results to determine need for warfarin.  - Per Hematologist no indication to pursue IVC filter in setting of portal vein thrombosis   Chronic diastolic CHF  - Compensated  - Blood pressure ~120/50 in setting of acute blood loss  - Oral Lasix on hold, likely has some fluid overload, give Lasix IV today.  CAD S/P LAD DES after FFR 06/22/14  - Appreciate Cardiology assistance  - Continue Plavix for now due to risk for acute stent thrombosis that would lead to large anterior MI  - Holding aspirin  - Home statin is Livalo - held for now.   Warfarin anticoagulation  - Warfarin on hold  - Vitamin K 2.5 mg by mouth given 9/28 at request of Hematology  - INR is 2.22 on 9/29.  1.47 on 10/2.    - Awaiting report from Capsule EGD to determine resumption of warfarin per Hematology.  HYPERTENSION, BENIGN  - Blood pressure remains controlled in setting of acute blood loss anemia  - Continue  to hold Lasix and Micardis   DVT Prophylaxis:  SCDs  Code Status: Full Family Communication: None at bedside.  Patient is alert, awake, and agreeable to plan. Disposition Plan: Remain inpatient.  Likely home tomorrow.  Consultants:  Hematology  Cardiology  Gastroenterology  Procedures:  Flex Sigmoidoscopy  Video Capsule Endoscopy  Tranfuse 1 unit PRBC on 10/2   Objective: Filed Vitals:   09/23/14 1901 09/23/14 1943 09/23/14 2128 09/24/14 0514   BP: 125/47 123/43 126/43 105/50  Pulse: 70 72 69 65  Temp: 97.8 F (36.6 C) 97.9 F (36.6 C) 98.2 F (36.8 C) 99.7 F (37.6 C)  TempSrc: Oral Oral Oral Oral  Resp: 20 20 20 20   Height:      Weight:    118.071 kg (260 lb 4.8 oz)  SpO2: 98% 98% 99% 95%    Intake/Output Summary (Last 24 hours) at 09/24/14 1025 Last data filed at 09/23/14 1937  Gross per 24 hour  Intake    320 ml  Output      0 ml  Net    320 ml   Filed Weights   09/22/14 2046 09/23/14 0453 09/24/14 0514  Weight: 122.108 kg (269 lb 3.2 oz) 120.8 kg (266 lb 5.1 oz) 118.071 kg (260 lb 4.8 oz)    Exam: General: Pleasant obese elderly male sitting in chair, NAD, appears stated age  HEENT:  Anicteic Sclera, MMM. Neck is supple, no JVD, no masses.  Cardiovascular: RRR, S1 S2 auscultated, no rubs, murmurs or gallops.   Respiratory: Clear to auscultation bilaterally with equal chest rise.  No wheezes or rales.    Abdomen: Soft, distended, nontender, + bowel sounds  Extremities: warm dry without cyanosis clubbing or edema.  Neuro: AAOx3, cranial nerves grossly intact. Strength 5/5 in upper and lower extremities  Psych: Normal affect and demeanor with intact judgement and insight  Data Reviewed: Basic Metabolic Panel:  Recent Labs Lab 09/18/14 1611 09/20/14 0301 09/21/14 0326 09/24/14 0615  NA 142 139 139 137  K 4.4 4.0 4.1 3.9  CL 107 103 104 104  CO2 22 24 22 23   GLUCOSE 104* 96 96 103*  BUN 17 14 12 13   CREATININE 1.05 0.99 0.93 0.96  CALCIUM 9.3 8.8 8.8 8.6   Liver Function Tests:  Recent Labs Lab 09/18/14 1611 09/20/14 0301  AST 24 24  ALT 39 33  ALKPHOS 97 89  BILITOT 0.3 0.5  PROT 6.6 6.3  ALBUMIN 3.5 3.2*   CBC:  Recent Labs Lab 09/18/14 1611  09/20/14 1700 09/21/14 0326 09/22/14 0725 09/23/14 0519 09/24/14 0615  WBC 6.1  < > 5.2 5.8 4.1 4.2 4.3  NEUTROABS 3.6  --   --   --   --   --   --   HGB 9.3*  < > 9.6* 9.3* 8.7* 8.4* 8.7*  HCT 29.5*  < > 30.1* 28.8* 27.0* 26.2* 26.5*    MCV 92.5  < > 92.3 95.0 92.5 94.6 88.9  PLT 200  < > 213 193 196 176 196  < > = values in this interval not displayed.  BNP (last 3 results)  Recent Labs  12/22/13 0741 05/29/14 1910 06/06/14 1244  PROBNP 27.0 36.3 83.0    Recent Results (from the past 240 hour(s))  MRSA PCR SCREENING     Status: None   Collection Time    09/19/14  6:40 AM      Result Value Ref Range Status   MRSA by PCR NEGATIVE  NEGATIVE Final  Comment:            The GeneXpert MRSA Assay (FDA     approved for NASAL specimens     only), is one component of a     comprehensive MRSA colonization     surveillance program. It is not     intended to diagnose MRSA     infection nor to guide or     monitor treatment for     MRSA infections.     Studies: Dg Abd 2 Views  09/22/2014   CLINICAL DATA:  80 year old male with abdominal distention, pain, nausea  EXAM: ABDOMEN - 2 VIEW  COMPARISON:  Prior CT 08/11/2014  FINDINGS: Portions of the left and right abdomen have been excluded given the patient's habitus.  Gas within small bowel and colon.  No abnormal distention.  Electronic radiopacity projects over the anatomic pelvis, likely representing capsule endoscopy within the rectum.  No free air visualized.  No unexpected calcifications.  No displaced fracture.  Unremarkable appearance of the lung bases.  IMPRESSION: Unremarkable bowel gas pattern. There appears to be a capsule endoscopy device within the rectum.  Signed,  Dulcy Fanny. Earleen Newport, DO  [facility]  [facility]   Electronically Signed   By: Corrie Mckusick O.D.   On: 09/22/2014 22:32    Scheduled Meds: . allopurinol  300 mg Oral Daily  . clopidogrel  75 mg Oral BH-q7a  . hydrocortisone-pramoxine  1 applicator Rectal TID  . iohexol  25 mL Oral Q1 Hr x 2  . pantoprazole  40 mg Oral QHS  . polyethylene glycol  17 g Oral Daily  . sodium chloride  3 mL Intravenous Q12H   Continuous Infusions: . sodium chloride       Principal Problem:   Rectal bleeding Active Problems:   HYPERTENSION, BENIGN   Peripheral neuropathy   History of DVT (deep vein thrombosis)   Chronic diastolic CHF (congestive heart failure)   Iron deficiency anemia   CAD S/P LAD DES after FFR 06/22/14   Esophageal varices without mention of bleeding   Portal vein thrombosis   Warfarin anticoagulation   External hemorrhoids   Rockwell Germany, PA-S2 [facility] Pager [phone]. If 7PM-7AM, please contact night-coverage at [email or web address], password [facility] 09/24/2014, 10:25 AM  LOS: 6 days    Addendum  Patient seen and examined, chart and data base reviewed.  I agree with the above assessment and plan.  For full details please see Mrs. Rockwell Germany, PA-S2 note.  I reviewed and amended the above note as needed.   Birdie Hopes, MD [facility] Pager: [phone] 09/24/2014, 10:25 AM

## 2014-09-24 NOTE — Progress Notes (Signed)
Patient ID: James [name], male   DOB: 05/12/1928, 80 y.o.   MRN: [id number]      Subjective:    Increased abdominal distension. Reports some increased DOE.   Objective:   Temp:  [97.4 F (36.3 C)-99.7 F (37.6 C)] 99.7 F (37.6 C) (10/03 0514) Pulse Rate:  [64-72] 65 (10/03 0514) Resp:  [16-21] 20 (10/03 0514) BP: (105-139)/(38-58) 105/50 mmHg (10/03 0514) SpO2:  [95 %-99 %] 95 % (10/03 0514) Weight:  [260 lb 4.8 oz (118.071 kg)] 260 lb 4.8 oz (118.071 kg) (10/03 0514) Last BM Date: 09/23/14  Filed Weights   09/22/14 2046 09/23/14 0453 09/24/14 0514  Weight: 269 lb 3.2 oz (122.108 kg) 266 lb 5.1 oz (120.8 kg) 260 lb 4.8 oz (118.071 kg)    Intake/Output Summary (Last 24 hours) at 09/24/14 0844 Last data filed at 09/23/14 1937  Gross per 24 hour  Intake    620 ml  Output      0 ml  Net    620 ml     Exam:  General: NAD  Resp: faint crackles bilateral bases  Cardiac: RRR, no m/r/g, no JVD  GI: abdomen distended, NT  MSK: 1+ bilateral edema  Neuro: no focal deficits  Psych: appropriate affect  Lab Results:  Basic Metabolic Panel:  Recent Labs Lab 09/20/14 0301 09/21/14 0326 09/24/14 0615  NA 139 139 137  K 4.0 4.1 3.9  CL 103 104 104  CO2 24 22 23   GLUCOSE 96 96 103*  BUN 14 12 13   CREATININE 0.99 0.93 0.96  CALCIUM 8.8 8.8 8.6    Liver Function Tests:  Recent Labs Lab 09/18/14 1611 09/20/14 0301  AST 24 24  ALT 39 33  ALKPHOS 97 89  BILITOT 0.3 0.5  PROT 6.6 6.3  ALBUMIN 3.5 3.2*    CBC:  Recent Labs Lab 09/22/14 0725 09/23/14 0519 09/24/14 0615  WBC 4.1 4.2 4.3  HGB 8.7* 8.4* 8.7*  HCT 27.0* 26.2* 26.5*  MCV 92.5 94.6 88.9  PLT 196 176 196    Cardiac Enzymes: No results found for this basename: CKTOTAL, CKMB, CKMBINDEX, TROPONINI,  in the last 168 hours  BNP:  Recent Labs  12/22/13 0741 05/29/14 1910 06/06/14 1244  PROBNP 27.0 36.3 83.0    Coagulation:  Recent Labs Lab 09/18/14 1611 09/20/14 0301  09/23/14 0519  INR 2.65* 2.22* 1.47    ECG:   Medications:   Scheduled Medications: . allopurinol  300 mg Oral Daily  . clopidogrel  75 mg Oral BH-[id number]  . furosemide  40 mg Intravenous Once  . hydrocortisone-pramoxine  1 applicator Rectal TID  . pantoprazole  40 mg Oral QHS  . polyethylene glycol  17 g Oral Daily  . sodium chloride  3 mL Intravenous Q12H     Infusions: . sodium chloride       PRN Medications:  acetaminophen, acetaminophen, magnesium hydroxide, nitroGLYCERIN, ondansetron (ZOFRAN) IV, ondansetron     Assessment/Plan    1. CAD - recent DES to LAD 06/2014 - patient on plavix only in setting of rectal bleeding. Had been on ASA, plavix, and coumadin (coumadin for portal vein thrombosis) on admission. Plan to resume coumadin when ok from GI standpoint, no ASA - he is not on statin at home, if ok with GI given his liver problems would resume at inpatient  2. Portal vein thrombosis - management per GI, had been on coumadin.   3. Fluid overload - likely combination of his liver disease and potentially  component of chronic diastolic dysfunction, exacerbated by pRBCs and diuretic being held in setting of GI bleed - agree with IV lasix today, follow volume status.        Carlyle Dolly, M.D.

## 2014-09-25 LAB — BASIC METABOLIC PANEL
Anion gap: 13 (ref 5–15)
BUN: 15 mg/dL (ref 6–23)
CALCIUM: 8.8 mg/dL (ref 8.4–10.5)
CO2: 23 mEq/L (ref 19–32)
CREATININE: 1.11 mg/dL (ref 0.50–1.35)
Chloride: 99 mEq/L (ref 96–112)
GFR calc Af Amer: 68 mL/min — ABNORMAL LOW (ref >90)
GFR calc non Af Amer: 59 mL/min — ABNORMAL LOW (ref >90)
Glucose, Bld: 109 mg/dL — ABNORMAL HIGH (ref 70–99)
POTASSIUM: 3.6 meq/L — AB (ref 3.7–5.3)
Sodium: 135 mEq/L — ABNORMAL LOW (ref 137–147)

## 2014-09-25 LAB — CBC
HCT: 27.4 % — ABNORMAL LOW (ref 39.0–52.0)
Hemoglobin: 8.9 g/dL — ABNORMAL LOW (ref 13.0–17.0)
MCH: 29.1 pg (ref 26.0–34.0)
MCHC: 32.5 g/dL (ref 30.0–36.0)
MCV: 89.5 fL (ref 78.0–100.0)
PLATELETS: 204 10*3/uL (ref 150–400)
RBC: 3.06 MIL/uL — ABNORMAL LOW (ref 4.22–5.81)
RDW: 20.6 % — AB (ref 11.5–15.5)
WBC: 4.8 10*3/uL (ref 4.0–10.5)

## 2014-09-25 MED ORDER — FUROSEMIDE 40 MG PO TABS: ORAL_TABLET | ORAL | Status: DC

## 2014-09-25 MED ORDER — POTASSIUM CHLORIDE CRYS ER 20 MEQ PO TBCR
60.0000 meq | EXTENDED_RELEASE_TABLET | Freq: Once | ORAL | Status: AC
  Administered 2014-09-25: 60 meq via ORAL
  Filled 2014-09-25: qty 3

## 2014-09-25 NOTE — Discharge Summary (Signed)
Physician Discharge Summary  James [name] WFU:[id number] DOB: 23-Mar-1928 [name]: 09/18/2014  PCP: [name], MD  Admit date: 09/18/2014 Discharge date: 09/25/2014  Time spent: 40 minutes  Recommendations for Outpatient Follow-up:  1. Followup with Dr. Beryle Beams within 1 week, follow with the Coumadin clinic tomorrow at 09/26/14. 2. Followup with [facility] on 10/14/14. 3. Follow on INR, CBC and BMP, on discharge patient still has tarry stools. 4. Recommended to take 80 mg of Lasix in a.m. and 40 in p.m. for 4 more days then go back to 80 mg total per day.  Discharge Diagnoses:  Principal Problem:   Rectal bleeding Active Problems:   HYPERTENSION, BENIGN   Peripheral neuropathy   History of DVT (deep vein thrombosis)   Chronic diastolic CHF (congestive heart failure)   Iron deficiency anemia   CAD S/P LAD DES after FFR 06/22/14   Esophageal varices without mention of bleeding   Portal vein thrombosis   Warfarin anticoagulation   External hemorrhoids   Discharge Condition: Stable  Diet recommendation: Heart healthy diet  Filed Weights   09/23/14 0453 09/24/14 0514 09/25/14 0525  Weight: 120.8 kg (266 lb 5.1 oz) 118.071 kg (260 lb 4.8 oz) 118.026 kg (260 lb 3.2 oz)    History of present illness:  Patient is a very pleasant 80 yo male with a history of DVT (11/2013), portal vein thrombosis, CAD with a DES placed 06/2014, esophageal varices who was anticoagulated at home with Coumadin, Plavix, and Aspirin, presented to the ED with rectal bleeding. He was admitted for a further workup. On presentation hgb 9.3, unchanged from previous. Does feel weak with standing. Patient had his recent EGD showing esophageal varices, colonoscopy showing polyps a month ago by Dr. Carlean Purl. EDP consulted Dr. Hilarie Fredrickson.   Hospital Course:   Chronic Iron deficiency anemia with likely acute blood loss component  -Presented with rectal bleeding and anemia. - Per Hematologist, will transfuse  to keep hemoglobin greater than or equal to 8.0  - Baseline hemoglobin appears to be around 10  - Serial CBC started on admission  - Current hemoglobin is 8.4 on morning of 10/2. Trending downward, one unit of packed RBC transfused on 09/23/14 - Hemoglobin improved from 8.4 to 8.9.  Rectal bleeding/recent EGD with Esophageal varices without mention of bleeding  - Presented with bloody stools, source of bleeding unclear.  - Given appearance of blood, upper GI source is doubtful - Upper EGD (08/08/2014) showed esophageal varices without bleeding.  - Recent colonoscopy (8/17) with only a few small polyps which were not biopsied therefore a source does not appear to be colonic in nature  - Dr. Beryle Beams ([facility]) suspects source is likely small bowel AVMs  - Holding anticoagulation except for Plavix due to DES placement (06/2014)  - GI flexible sigmoidoscopy on 9/30 revealed external hemorrhoids; Proctofoam-HC initiated on 9/30  - Patient had bowel movement - dark, tarry stool on 10/2.  - Capsule endoscopy done and showed no evidence of AVMs, per hematology restarts Coumadin. - Recommendation to restart Coumadin and Plavix and avoid aspirin and other NSAIDs. - Patient still has tarry black BMs, if continues or worsen recommend to stop Coumadin  Constipation  - Patient appears to have distended nontender abdomen on morning of 10/2.  - He was given stool softeners, this resolved, patient is passing tarry black stools.  Portal vein thrombosis / history of left lower extremity DVT  - Previous coagulopathic workup negative.  - Source of bleeding attempting to be localized pending  results of capsule endoscopy  - Current INR 1.47 (10/2) while only taking Plavix. Awaiting capsule EGD results to determine need for warfarin.  - Per Hematologist no indication to pursue IVC filter in setting of portal vein thrombosis. - Portal vein thrombosis and portal hypertension, please note that portal hypertension is  likely secondary to portal vein partial  occlusion not to hepatic cirrhosis (hepatic function test are okay).   Chronic diastolic CHF  - Compensated  - Blood pressure ~120/50 in setting of acute blood loss  - Oral Lasix on hold in settings of GI bleed, patient developed some abdominal distention, CT scan showed ascites. - Lasix restarted in IV form while in the hospital, discharged on higher dose of Lasix for 4 more days. - Explained to the patient and his daughter that it will take time for the ascites to resolve, they voiced understanding.  CAD S/P LAD DES after FFR 06/22/14  - Appreciate Cardiology assistance  - Continue Plavix for now due to risk for acute stent thrombosis that would lead to large anterior MI  - Holding aspirin  - Home statin is Livalo - held for now.   Warfarin anticoagulation  - Warfarin on hold  - Vitamin K 2.5 mg by mouth given 9/28 at request of Hematology  - INR is 2.22 on 9/29. 1.47 on 10/2.  - Restart warfarin anticoagulation according to hematology recommendation.  HYPERTENSION, BENIGN  - Blood pressure remains controlled in setting of acute blood loss anemia  - Lasix; this held time of addition, restarted on the time of discharge.     Procedures:  Colonoscopy done on 09/21/14 by Dr. Hilarie Fredrickson showed sessile polyps in the ascending colon , otherwise normal colonoscopy.  Video capsule endoscopy: findings complete study, the preparation until the distal ileum and the lumen is obscured in part by debris. A few benign-appearing polyps in the jejunum, nonbleeding and small. Prominent lymphoid hyperplasia and edema in the distal ileum without obvious bleeding. No evidence of angiodysplastic lesions or active/recent bleeding.  Transfusion of one unit of packed RBCs on 09/23/14  Consultations:  Gastroenterology.  Cardiology.  Hematology/oncology.  Discharge Exam: Filed Vitals:   09/25/14 0525  BP: 97/43  Pulse: 62  Temp: 99.1 F (37.3 C)  Resp: 12    General: Alert and awake, oriented x3, not in any acute distress. HEENT: anicteric sclera, pupils reactive to light and accommodation, EOMI CVS: S1-S2 clear, no murmur rubs or gallops Chest: clear to auscultation bilaterally, no wheezing, rales or rhonchi Abdomen: soft, nontender, mild abdominal distention, normal bowel sounds. Extremities: no cyanosis, clubbing or edema noted bilaterally Neuro: Cranial nerves II-XII intact, no focal neurological deficits   Discharge Instructions You were cared for by a hospitalist during your hospital stay. If you have any questions about your discharge medications or the care you received while you were in the hospital after you are discharged, you can call the unit and asked to speak with the hospitalist on call if the hospitalist that took care of you is not available. Once you are discharged, your primary care physician will handle any further medical issues. Please note that NO REFILLS for any discharge medications will be authorized once you are discharged, as it is imperative that you return to your primary care physician (or establish a relationship with a primary care physician if you do not have one) for your aftercare needs so that they can reassess your need for medications and monitor your lab values.  Discharge Instructions   Diet -  low sodium heart healthy    Complete by:  As directed      Increase activity slowly    Complete by:  As directed      Other Restrictions    Complete by:  As directed   No aspirin or other NSAIDs          Current Discharge Medication List    CONTINUE these medications which have CHANGED   Details  furosemide (LASIX) 40 MG tablet Take 2 tablets (total of 80 mg) in the morning and one tablet in the evening for 4 more days, then take one tablet twice a day. Qty: 60 tablet, Refills: 0      CONTINUE these medications which have NOT CHANGED   Details  allopurinol (ZYLOPRIM) 300 MG tablet Take 300 mg by mouth  every morning.     Cholecalciferol (VITAMIN D3) 2000 UNITS capsule Take 2,000 Units by mouth every morning.     clopidogrel (PLAVIX) 75 MG tablet Take 75 mg by mouth every morning.     Misc Natural Products (OSTEO BI-FLEX JOINT SHIELD PO) Take 1 tablet by mouth every morning.     Multiple Vitamin (MULTIVITAMIN) tablet Take 1 tablet by mouth every morning.     pantoprazole (PROTONIX) 40 MG tablet Take 40 mg by mouth at bedtime.    Pitavastatin Calcium (LIVALO) 4 MG TABS Take 4 mg by mouth at bedtime.    potassium chloride SA (K-DUR,KLOR-CON) 20 MEQ tablet Take 20 mEq by mouth at bedtime.    Probiotic Product (PROBIOTIC & ACIDOPHILUS EX ST PO) Take 1 tablet by mouth every morning.     Psyllium (METAMUCIL PO) Take 20 mLs by mouth every evening.    ranitidine (ZANTAC) 150 MG tablet Take 150 mg by mouth at bedtime.    telmisartan (MICARDIS) 40 MG tablet Take 40 mg by mouth every morning.     warfarin (COUMADIN) 5 MG tablet Take 5-7.5 mg by mouth daily. Take 7.5 mg on Mondays and Thursdays. Take 5 mg on all other days.    nitroGLYCERIN (NITROSTAT) 0.4 MG SL tablet Place 1 tablet (0.4 mg total) under the tongue every 5 (five) minutes as needed. For chest pain Qty: 25 tablet, Refills: 5   Associated Diagnoses: Shortness of breath       Allergies  Allergen Reactions  . Aleve [Naproxen Sodium] Other (See Comments)    Causes platlet drop  . Rofecoxib Other (See Comments)    Pt doesn't remember  . Sulfa Antibiotics Other (See Comments)    Pt doesn't remember  . Sulfamethoxazole-Trimethoprim Other (See Comments)     Causes low platelets and bleeding  . Tramadol Hcl Other (See Comments)    dizziness, drowsiness  . Neomycin-Bacitracin Zn-Polymyx Rash  . Neosporin [Neomycin-Polymyxin-Gramicidin] Rash  . Penicillins Swelling and Rash   Follow-up Information   Follow up with Annia Belt, MD In 1 week.   Specialty:  Oncology   Contact information:   [address] [place] [address] [phone]       Follow up with [name], MD In 1 week.   Specialty:  Family Medicine   Contact information:   [address] [place] [address] [phone]        The results of significant diagnostics from this hospitalization (including imaging, microbiology, ancillary and laboratory) are listed below for reference.    Significant Diagnostic Studies: Ct Abdomen Pelvis W Contrast  09/24/2014   CLINICAL DATA:  Abdominal distention.  Subsequent encounter.  EXAM: CT ABDOMEN AND PELVIS WITH CONTRAST  TECHNIQUE: Multidetector CT imaging of the abdomen and pelvis was performed using the standard protocol following bolus administration of intravenous contrast.  CONTRAST:  124mL OMNIPAQUE IOHEXOL 300 MG/ML  SOLN  COMPARISON:  CT abdomen pelvis -09/22/2014; abdominal MRI - 08/20/2014; hepatic Doppler ultrasound -08/26/2014  FINDINGS: Normal hepatic contour.  Re- demonstrated in grossly unchanged are ill-defined slightly infiltrative appearing lesions within the caudal aspect of the right lobe of the liver with dominant lesion measuring approximately 3.3 x 3.8 cm (axial image 34, series 2). Additional lesions also within the caudal segment of the right lobe of the liver measure approximately 2.7 cm (image 31, series 2), 1.8 cm (image 34) and approximately 2.9 cm (image 36). These lesions were felt to represent ectatic and aneurysmally dilated branches of the distal aspect of peripheral portal vein branches on recently performed abdominal MRI.  Unchanged appearance of the previously characterized cavernous hemangioma within the posterior segment of the right lobe of the liver measuring approximately 2.6 x 2.3 cm (image 20, series 2).  There is unchanged partial occlusion of the peripheral aspect of the main portal vein (representative axial coronal images 78 - 91, series 5). The distal tributaries of the portal vein appear patent on this examination.  Interval  development of small volume intra-abdominal ascites. Borderline splenomegaly with the spleen measuring 12.3 cm in diameter (image twenty-three, series 2). Incidental note is made of a small splenule within the splenic hilum.  There is symmetric enhancement and excretion of the bilateral kidneys. No definite renal stones in this postcontrast examination. No discrete renal lesions. No urinary obstruction or perinephric stranding. Normal appearance of the bilateral adrenal glands. The pancreas remains largely fatty replaced.  Small hiatal hernia. Scattered colonic diverticulosis without evidence of diverticulitis. Enteric contrast extends to the level of the hepatic flexure of the colon. No evidence of enteric obstruction. The appendix is not visualized compatible with provided surgical history. No pneumoperitoneum, pneumatosis or portal venous gas.  Moderate amount of atherosclerotic plaque within a normal caliber abdominal aorta. The major branch vessels of the abdominal aorta appear patent on this non CTA examination.  Shotty porta hepatis and retroperitoneal lymph nodes are individually not enlarged by size criteria with index precaval lymph node measuring 1.1 cm in greatest short axis diameter (image 31, series 2).  The prostate is enlarged with mass effect upon the undersurface the urinary bladder. Normal appearance of the urinary bladder given degree distention. No free fluid within the pelvic cul-de-sac.  Limited visualization of the lower thorax demonstrates minimal dependent subpleural ground-glass atelectasis. No discrete focal airspace opacities. No pleural effusion.  Borderline cardiomegaly. Calcifications within the mitral valve annulus. No pericardial effusion.  The No acute or aggressive osseus abnormalities. Mild-to-moderate multilevel lumbar spine DDD, worse at L5-S1 with disc space height loss, endplate irregularity and sclerosis. A bone island is seen within the left femoral neck.  There is a  moderate-sized left-sided mesenteric fat containing indirect inguinal hernia.  IMPRESSION: 1. Interval development of small amount of intra-abdominal ascites, likely the etiology of the patient's subjective complaint of abdominal distension. No evidence of enteric or urinary obstruction. 2. Grossly unchanged subtotal occlusion of the main trunk of the portal vein, the etiology of which is not depicted on this examination. 3. Re-demonstrated multiple ill-defined apparent lesions within the caudal aspect of the right lobe of the liver with dominant lesion measuring approximately 3.8 cm - note, these lesions were felt to represent ectatic and aneurysmally dilated  distal branches of the right portal vein on prior MRI performed 08/20/2014. 4. Unchanged appearance of previously characterized approximately 2.6 cm cavernous hemangioma within the central aspect of the posterior segment of the right lobe of the liver.   Electronically Signed   By: Sandi Mariscal M.D.   On: 09/24/2014 15:43   US Abdomen Limited  08/26/2014   CLINICAL DATA:  Abnormal portal vein on MRI  EXAM: DUPLEX ULTRASOUND OF LIVER  TECHNIQUE: Color and duplex Doppler ultrasound was performed to evaluate the hepatic in-flow and out-flow vessels.  COMPARISON:  MRI of the abdomen 08/20/2014; CT abdomen 08/11/2014  FINDINGS: Portal Vein Velocities  Main:  Occluded  Right:  Occluded  Left:  Occluded  Hepatic Vein Velocities  Right:  37 cm/sec  Middle:  22 cm/sec  Left:  24 cm/sec  Hepatic Artery Velocity:  108 cm/sec  Splenic Vein Velocity:  17 cm/sec  Varices: None  Ascites: Trace perihepatic ascites  Gallbladder: Numerous tiny echogenic foci throughout the gallbladder consistent with sludge versus small stones. No gallbladder wall thickening or pericholecystic fluid. Per the sonographer, the sonographic Percell Miller sign is negative.  Common bile duct:  Within normal limits at 6 mm  Liver: 2.1 x 2.7 x 2.0 cm circumscribed hyperechoic mass in the right hepatic lobe  corresponds with a hemangioma seen on the recent cross-sectional imaging studies.  IMPRESSION: 1. Complete occlusion of the main, right and left portal veins. The main portal vein is expanded with low-level internal echoes suggestive of acute thrombus. 2. 2.7 cm hemangioma in the right hemi liver. 3. Gallbladder sludge and/or small stones. No secondary sonographic findings to suggest acute cholecystitis. 4. Trace perihepatic ascites may be related to acute/subacute portal venous thrombosis. 5. The splenic vein remains patent. Signed,  Criselda Peaches, MD  [facility]  [facility]   Electronically Signed   By: Jacqulynn Cadet M.D.   On: 08/26/2014 14:37   [place] Art/ven Flow Abd Pelv Doppler  08/26/2014   CLINICAL DATA:  Abnormal portal vein on MRI  EXAM: DUPLEX ULTRASOUND OF LIVER  TECHNIQUE: Color and duplex Doppler ultrasound was performed to evaluate the hepatic in-flow and out-flow vessels.  COMPARISON:  MRI of the abdomen 08/20/2014; CT abdomen 08/11/2014  FINDINGS: Portal Vein Velocities  Main:  Occluded  Right:  Occluded  Left:  Occluded  Hepatic Vein Velocities  Right:  37 cm/sec  Middle:  22 cm/sec  Left:  24 cm/sec  Hepatic Artery Velocity:  108 cm/sec  Splenic Vein Velocity:  17 cm/sec  Varices: None  Ascites: Trace perihepatic ascites  Gallbladder: Numerous tiny echogenic foci throughout the gallbladder consistent with sludge versus small stones. No gallbladder wall thickening or pericholecystic fluid. Per the sonographer, the sonographic Percell Miller sign is negative.  Common bile duct:  Within normal limits at 6 mm  Liver: 2.1 x 2.7 x 2.0 cm circumscribed hyperechoic mass in the right hepatic lobe corresponds with a hemangioma seen on the recent cross-sectional imaging studies.  IMPRESSION: 1. Complete occlusion of the main, right and left portal veins. The main portal vein is expanded with low-level internal echoes suggestive of acute thrombus. 2. 2.7 cm  hemangioma in the right hemi liver. 3. Gallbladder sludge and/or small stones. No secondary sonographic findings to suggest acute cholecystitis. 4. Trace perihepatic ascites may be related to acute/subacute portal venous thrombosis. 5. The splenic vein remains patent. Signed,  Criselda Peaches, MD  [facility]  [facility]   Electronically  Signed   By: Jacqulynn Cadet M.D.   On: 08/26/2014 14:37   Dg Abd 2 Views  09/22/2014   CLINICAL DATA:  80 year old male with abdominal distention, pain, nausea  EXAM: ABDOMEN - 2 VIEW  COMPARISON:  Prior CT 08/11/2014  FINDINGS: Portions of the left and right abdomen have been excluded given the patient's habitus.  Gas within small bowel and colon.  No abnormal distention.  Electronic radiopacity projects over the anatomic pelvis, likely representing capsule endoscopy within the rectum.  No free air visualized.  No unexpected calcifications.  No displaced fracture.  Unremarkable appearance of the lung bases.  IMPRESSION: Unremarkable bowel gas pattern. There appears to be a capsule endoscopy device within the rectum.  Signed,  Dulcy Fanny. Earleen Newport, DO  [facility]  [facility]   Electronically Signed   By: Corrie Mckusick O.D.   On: 09/22/2014 22:32    Microbiology: Recent Results (from the past 240 hour(s))  MRSA PCR SCREENING     Status: None   Collection Time    09/19/14  6:40 AM      Result Value Ref Range Status   MRSA by PCR NEGATIVE  NEGATIVE Final   Comment:            The GeneXpert MRSA Assay (FDA     approved for NASAL specimens     only), is one component of a     comprehensive MRSA colonization     surveillance program. It is not     intended to diagnose MRSA     infection nor to guide or     monitor treatment for     MRSA infections.     Labs: Basic Metabolic Panel:  Recent Labs Lab 09/18/14 1611 09/20/14 0301 09/21/14 0326 09/24/14 0615  09/25/14 0410  NA 142 139 139 137 135*  K 4.4 4.0 4.1 3.9 3.6*  CL 107 103 104 104 99  CO2 22 24 22 23 23   GLUCOSE 104* 96 96 103* 109*  BUN 17 14 12 13 15   CREATININE 1.05 0.99 0.93 0.96 1.11  CALCIUM 9.3 8.8 8.8 8.6 8.8   Liver Function Tests:  Recent Labs Lab 09/18/14 1611 09/20/14 0301  AST 24 24  ALT 39 33  ALKPHOS 97 89  BILITOT 0.3 0.5  PROT 6.6 6.3  ALBUMIN 3.5 3.2*   No results found for this basename: LIPASE, AMYLASE,  in the last 168 hours No results found for this basename: AMMONIA,  in the last 168 hours CBC:  Recent Labs Lab 09/18/14 1611  09/21/14 0326 09/22/14 0725 09/23/14 0519 09/24/14 0615 09/25/14 0410  WBC 6.1  < > 5.8 4.1 4.2 4.3 4.8  NEUTROABS 3.6  --   --   --   --   --   --   HGB 9.3*  < > 9.3* 8.7* 8.4* 8.7* 8.9*  HCT 29.5*  < > 28.8* 27.0* 26.2* 26.5* 27.4*  MCV 92.5  < > 95.0 92.5 94.6 88.9 89.5  PLT 200  < > 193 196 176 196 204  < > = values in this interval not displayed. Cardiac Enzymes: No results found for this basename: CKTOTAL, CKMB, CKMBINDEX, TROPONINI,  in the last 168 hours BNP: BNP (last 3 results)  Recent Labs  12/22/13 0741 05/29/14 1910 06/06/14 1244  PROBNP 27.0 36.3 83.0   CBG: No results found for this basename: GLUCAP,  in the last 168 hours     Signed:  [facility]  A  [facility] 09/25/2014, 9:37 AM

## 2014-09-25 NOTE — Progress Notes (Signed)
Patient ID: James [name], male   DOB: 03/07/1928, 80 y.o.   MRN: [id number]    Primary cardiologist:  Subjective:    Still with some abdominal distension. Some SOB  Objective:   Temp:  [97.7 F (36.5 C)-99.1 F (37.3 C)] 99.1 F (37.3 C) (10/04 0525) Pulse Rate:  [62-70] 62 (10/04 0525) Resp:  [12-20] 12 (10/04 0525) BP: (97-122)/(43-79) 97/43 mmHg (10/04 0525) SpO2:  [96 %-99 %] 99 % (10/04 0525) Weight:  [260 lb 3.2 oz (118.026 kg)] 260 lb 3.2 oz (118.026 kg) (10/04 0525) Last BM Date: 09/24/14 ("{black as stoot")  Filed Weights   09/23/14 0453 09/24/14 0514 09/25/14 0525  Weight: 266 lb 5.1 oz (120.8 kg) 260 lb 4.8 oz (118.071 kg) 260 lb 3.2 oz (118.026 kg)    Intake/Output Summary (Last 24 hours) at 09/25/14 6767 Last data filed at 09/24/14 2132  Gross per 24 hour  Intake    400 ml  Output   1200 ml  Net   -800 ml    Telemetry: None  Exam:  General: NAD  Resp: CTAB  Cardiac: RRR, no m/r/g, no JVD  GI: distended, NT  MSK: trace bilateral edema  Neuro: no focal deficits  Psych: no focal deficits  Lab Results:  Basic Metabolic Panel:  Recent Labs Lab 09/21/14 0326 09/24/14 0615 09/25/14 0410  NA 139 137 135*  K 4.1 3.9 3.6*  CL 104 104 99  CO2 22 23 23   GLUCOSE 96 103* 109*  BUN 12 13 15   CREATININE 0.93 0.96 1.11  CALCIUM 8.8 8.6 8.8    Liver Function Tests:  Recent Labs Lab 09/18/14 1611 09/20/14 0301  AST 24 24  ALT 39 33  ALKPHOS 97 89  BILITOT 0.3 0.5  PROT 6.6 6.3  ALBUMIN 3.5 3.2*    CBC:  Recent Labs Lab 09/23/14 0519 09/24/14 0615 09/25/14 0410  WBC 4.2 4.3 4.8  HGB 8.4* 8.7* 8.9*  HCT 26.2* 26.5* 27.4*  MCV 94.6 88.9 89.5  PLT 176 196 204    Cardiac Enzymes: No results found for this basename: CKTOTAL, CKMB, CKMBINDEX, TROPONINI,  in the last 168 hours  BNP:  Recent Labs  12/22/13 0741 05/29/14 1910 06/06/14 1244  PROBNP 27.0 36.3 83.0    Coagulation:  Recent Labs Lab 09/18/14 1611  09/20/14 0301 09/23/14 0519  INR 2.65* 2.22* 1.47    ECG:   Medications:   Scheduled Medications: . allopurinol  300 mg Oral Daily  . atorvastatin  20 mg Oral q1800  . clopidogrel  75 mg Oral BH-q7a  . hydrocortisone-pramoxine  1 applicator Rectal TID  . pantoprazole  40 mg Oral QHS  . polyethylene glycol  17 g Oral Daily  . sodium chloride  3 mL Intravenous Q12H     Infusions: . sodium chloride       PRN Medications:  acetaminophen, acetaminophen, magnesium hydroxide, nitroGLYCERIN, ondansetron (ZOFRAN) IV, ondansetron     Assessment/Plan   1. CAD  - recent DES to LAD 06/2014  - patient on plavix only in setting of rectal bleeding. Had been on ASA, plavix, and coumadin (coumadin for portal vein thrombosis) on admission. Plan to resume coumadin when ok from GI standpoint, no ASA  - he is not on statin at home, if ok with GI given his liver problems would resume as inpatient   2. Portal vein thrombosis  - management per GI, had been on coumadin.   3. Fluid overload  - likely combination of his liver  disease and potentially component of chronic diastolic dysfunction, exacerbated by pRBCs and diuretic being held in setting of GI bleed  - received IV lasix 40mg  x 2 yesterday, negative 88mL. Uptrend in Cr but still within normal limits.  - still appears to have some extra volume, will need continued diuresis. As outpatient would be reasonable. He was on lasix 80mg  daily at home, would suggest 80mg  in AM and 40mg  in PM for next 4 days then resume his 80mg  once a day. Will need extra KCl during that 4 day period, close follow up with BMET. If remains in hospital would continue IV lasix today.  4. Anemia - followed by primary team and GI        Carlyle Dolly, M.D.

## 2014-09-25 NOTE — Discharge Planning (Signed)
Patient discharged home in stable condition. Verbalizes understanding of all discharge instructions, including home medications and follow up appointments. 

## 2014-09-26 ENCOUNTER — Encounter (HOSPITAL_COMMUNITY): Payer: Medicare Other

## 2014-09-26 ENCOUNTER — Other Ambulatory Visit: Payer: Self-pay | Admitting: Oncology

## 2014-09-26 ENCOUNTER — Ambulatory Visit (INDEPENDENT_AMBULATORY_CARE_PROVIDER_SITE_OTHER): Payer: Medicare Other | Admitting: Pharmacist

## 2014-09-26 DIAGNOSIS — Z7901 Long term (current) use of anticoagulants: Secondary | ICD-10-CM

## 2014-09-26 DIAGNOSIS — I81 Portal vein thrombosis: Secondary | ICD-10-CM

## 2014-09-26 DIAGNOSIS — Z7902 Long term (current) use of antithrombotics/antiplatelets: Secondary | ICD-10-CM

## 2014-09-26 DIAGNOSIS — D509 Iron deficiency anemia, unspecified: Secondary | ICD-10-CM

## 2014-09-26 DIAGNOSIS — K625 Hemorrhage of anus and rectum: Secondary | ICD-10-CM

## 2014-09-26 LAB — POCT INR: INR: 1.2

## 2014-09-26 NOTE — Progress Notes (Signed)
Patient is on anticoagulation for portal vein thrombosis and is starting coumadin with a lovenox bridge.  I have reviewed Dr. Gladstone Pih note.

## 2014-09-26 NOTE — Progress Notes (Signed)
Anti-Coagulation Progress Note  James [name] is a 80 y.o. male who is currently on an anti-coagulation regimen.    RECENT RESULTS: Recent results are below, the most recent result is correlated with a dose of ZERO mg. per week: Lab Results  Component Value Date   INR 1.20 09/26/2014   INR 1.47 09/23/2014   INR 2.22* 09/20/2014    ANTI-COAG DOSE: Anticoagulation Dose Instructions as of 09/26/2014     [name] Tue Wed Thu Fri Sat   New Dose 5 mg 5 mg 5 mg 5 mg 5 mg 5 mg 5 mg       ANTICOAG SUMMARY: Anticoagulation Episode Summary   Current INR goal 2.0-2.5  Next INR check 09/30/2014  INR from last check 1.20! (09/26/2014)  Goal at result date 2.0-3.0  Weekly max dose   Target end date Indefinite  [facility]  Preferred lab   Send INR reminders to    Indications  Portal vein thrombosis [I81] Encounter for long-term (current) use of antiplatelets/antithrombotics [Z79.02]        Comments Target range is actually 2.0 - 2.3 (amended after initially having established when patient had GI Bleed with INR = 2.4)      Anticoagulation Care Providers   Provider Role Specialty Phone number   Annia Belt, MD  Oncology [phone]      ANTICOAG TODAY: Anticoagulation Summary as of 09/26/2014   INR goal 2.0-2.5  Prior goal 2.0-3.0  Selected INR 1.20! (09/26/2014)  Next INR check 09/30/2014  Target end date Indefinite   Indications  Portal vein thrombosis [I81] Encounter for long-term (current) use of antiplatelets/antithrombotics [Z79.02]      Anticoagulation Episode Summary   [facility]   Preferred lab    Send INR reminders to    Comments Target range is actually 2.0 - 2.3 (amended after initially having established when patient had GI Bleed with INR = 2.4)    Anticoagulation Care Providers   Provider Role Specialty Phone number   Annia Belt, MD  Oncology [phone]      PATIENT INSTRUCTIONS: Patient  Instructions  Patient instructed to take medications as defined in the Anti-coagulation Track section of this encounter.  Patient instructed to take today's dose.  Patient verbalized understanding of these instructions.       FOLLOW-UP Return in 4 days (on 09/30/2014) for Follow up INR at 0930h with Dr. Mannie Stabile PAGE HER.  [name], III Pharm.D., CACP

## 2014-09-26 NOTE — Patient Instructions (Signed)
Patient instructed to take medications as defined in the Anti-coagulation Track section of this encounter.  Patient instructed to take today's dose.  Patient verbalized understanding of these instructions.    

## 2014-09-26 NOTE — Progress Notes (Signed)
Patient is restarting coumadin after GIB for portal vein thrombosis.  INR 1.2.  Goal is very narrow.  I have reviewed Dr. Gladstone Pih note.

## 2014-09-28 ENCOUNTER — Encounter (HOSPITAL_COMMUNITY): Payer: Medicare Other

## 2014-09-29 ENCOUNTER — Other Ambulatory Visit: Payer: Self-pay | Admitting: Cardiology

## 2014-09-30 ENCOUNTER — Ambulatory Visit (INDEPENDENT_AMBULATORY_CARE_PROVIDER_SITE_OTHER): Payer: Medicare Other | Admitting: Pharmacist

## 2014-09-30 ENCOUNTER — Encounter (HOSPITAL_COMMUNITY): Payer: Medicare Other

## 2014-09-30 ENCOUNTER — Ambulatory Visit: Payer: Medicare Other | Admitting: Pharmacist

## 2014-09-30 DIAGNOSIS — Z7902 Long term (current) use of antithrombotics/antiplatelets: Secondary | ICD-10-CM

## 2014-09-30 DIAGNOSIS — I81 Portal vein thrombosis: Secondary | ICD-10-CM

## 2014-09-30 LAB — POCT INR: INR: 1.4

## 2014-09-30 NOTE — Patient Instructions (Signed)
Patient instructed to take medications as defined in the Anti-coagulation Track section of this encounter.  Patient instructed to take today's dose.  Patient verbalized understanding of these instructions.    

## 2014-09-30 NOTE — Progress Notes (Signed)
Anti-Coagulation Progress Note  James [name] is a 81 y.o. male who reports to the clinic for monitoring of anticoagulation treatment.    RECENT RESULTS: Recent results are below, the most recent result is correlated with a dose of 35 mg. per week: Lab Results  Component Value Date   INR 1.4 09/30/2014   INR 1.20 09/26/2014   INR 1.47 09/23/2014    Weekly dose was unchanged due to narrow target range of 2.0-2.3. We will re-check patient in 3 days, on Monday, 10/03/14, and if patient not progressing closer to goal, will consider a dose change on that day.  ANTI-COAG DOSE: INR as of 09/30/2014 and Previous Dosing Information   INR Dt INR Goal Wkly Tot Sun Mon Tue Wed Thu Fri Sat   09/30/2014 1.4 2.0-2.5 35 mg 5 mg 5 mg 5 mg 5 mg 5 mg 5 mg 5 mg    Anticoagulation Dose Instructions as of 09/30/2014     Total Sun Mon Tue Wed Thu Fri Sat   New Dose 35 mg 5 mg 5 mg 5 mg 5 mg 5 mg 5 mg 5 mg     (5 mg x 1)  (5 mg x 1)  (5 mg x 1)  (5 mg x 1)  (5 mg x 1)  (5 mg x 1)  (5 mg x 1)     ANTICOAG SUMMARY: Anticoagulation Episode Summary   Current INR goal 2.0-2.5  Next INR check 10/03/2014  INR from last check 1.4! (09/30/2014)  Weekly max dose   Target end date Indefinite  [facility]  Preferred lab   Send INR reminders to    Indications  Portal vein thrombosis [I81] Encounter for long-term (current) use of antiplatelets/antithrombotics [Z79.02]        Comments Target range is actually 2.0 - 2.3 (amended after initially having established when patient had GI Bleed with INR = 2.4)      Anticoagulation Care Providers   Provider Role Specialty Phone number   Annia Belt, MD  Oncology [phone]     PATIENT INSTRUCTIONS: Patient Instructions  Patient instructed to take medications as defined in the Anti-coagulation Track section of this encounter.  Patient instructed to take today's dose.  Patient verbalized understanding of these instructions.       FOLLOW-UP Monday, 10/03/14 at 10 am  Flossie Dibble, PharmD BCPS, BCACP

## 2014-10-03 ENCOUNTER — Encounter (HOSPITAL_COMMUNITY): Payer: Medicare Other

## 2014-10-03 ENCOUNTER — Encounter: Payer: Self-pay | Admitting: Family Medicine

## 2014-10-03 ENCOUNTER — Ambulatory Visit (INDEPENDENT_AMBULATORY_CARE_PROVIDER_SITE_OTHER): Payer: Medicare Other | Admitting: Family Medicine

## 2014-10-03 ENCOUNTER — Ambulatory Visit (INDEPENDENT_AMBULATORY_CARE_PROVIDER_SITE_OTHER): Payer: Medicare Other | Admitting: Pharmacist

## 2014-10-03 VITALS — BP 120/68 | HR 62 | Temp 97.5°F | Resp 16 | Wt 271.8 lb

## 2014-10-03 DIAGNOSIS — Z7902 Long term (current) use of antithrombotics/antiplatelets: Secondary | ICD-10-CM

## 2014-10-03 DIAGNOSIS — I2510 Atherosclerotic heart disease of native coronary artery without angina pectoris: Secondary | ICD-10-CM

## 2014-10-03 DIAGNOSIS — I81 Portal vein thrombosis: Secondary | ICD-10-CM

## 2014-10-03 DIAGNOSIS — R609 Edema, unspecified: Secondary | ICD-10-CM

## 2014-10-03 DIAGNOSIS — Z7901 Long term (current) use of anticoagulants: Secondary | ICD-10-CM

## 2014-10-03 DIAGNOSIS — Z9861 Coronary angioplasty status: Secondary | ICD-10-CM

## 2014-10-03 DIAGNOSIS — K625 Hemorrhage of anus and rectum: Secondary | ICD-10-CM

## 2014-10-03 DIAGNOSIS — D509 Iron deficiency anemia, unspecified: Secondary | ICD-10-CM

## 2014-10-03 DIAGNOSIS — I5032 Chronic diastolic (congestive) heart failure: Secondary | ICD-10-CM

## 2014-10-03 DIAGNOSIS — K921 Melena: Secondary | ICD-10-CM

## 2014-10-03 DIAGNOSIS — K922 Gastrointestinal hemorrhage, unspecified: Secondary | ICD-10-CM | POA: Insufficient documentation

## 2014-10-03 LAB — CBC WITH DIFFERENTIAL/PLATELET
Basophils Absolute: 0.1 10*3/uL (ref 0.0–0.1)
Basophils Relative: 1 % (ref 0–1)
Eosinophils Absolute: 0.2 10*3/uL (ref 0.0–0.7)
Eosinophils Relative: 3 % (ref 0–5)
HEMATOCRIT: 29.4 % — AB (ref 39.0–52.0)
Hemoglobin: 9.4 g/dL — ABNORMAL LOW (ref 13.0–17.0)
LYMPHS ABS: 1.3 10*3/uL (ref 0.7–4.0)
LYMPHS PCT: 25 % (ref 12–46)
MCH: 29.1 pg (ref 26.0–34.0)
MCHC: 32 g/dL (ref 30.0–36.0)
MCV: 91 fL (ref 78.0–100.0)
MONOS PCT: 15 % — AB (ref 3–12)
Monocytes Absolute: 0.8 10*3/uL (ref 0.1–1.0)
NEUTROS ABS: 2.9 10*3/uL (ref 1.7–7.7)
Neutrophils Relative %: 56 % (ref 43–77)
Platelets: 250 10*3/uL (ref 150–400)
RBC: 3.23 MIL/uL — AB (ref 4.22–5.81)
RDW: 19.3 % — ABNORMAL HIGH (ref 11.5–15.5)
WBC: 5.2 10*3/uL (ref 4.0–10.5)

## 2014-10-03 LAB — BASIC METABOLIC PANEL
BUN: 16 mg/dL (ref 6–23)
CO2: 23 mEq/L (ref 19–32)
Calcium: 8.9 mg/dL (ref 8.4–10.5)
Chloride: 106 mEq/L (ref 96–112)
Creatinine, Ser: 1.1 mg/dL (ref 0.4–1.5)
GFR: 70.7 mL/min (ref >60.00)
GLUCOSE: 99 mg/dL (ref 70–99)
POTASSIUM: 3.9 meq/L (ref 3.5–5.1)
Sodium: 137 mEq/L (ref 135–145)

## 2014-10-03 LAB — POCT INR: INR: 1.7

## 2014-10-03 NOTE — Patient Instructions (Signed)
Go to the lab on the way out.  We'll contact you with your lab report. CBC per Dr. Beryle Beams, BMET per Dr. Damita Dunnings.  We'll make plans about the lasix dose when I see your labs. Take care. Glad to see you.

## 2014-10-03 NOTE — Progress Notes (Signed)
Anti-Coagulation Progress Note  James [name] is a 80 y.o. male who reports to the clinic for monitoring of anticoagulation treatment.    RECENT RESULTS: Recent results are below, the most recent result is correlated with a dose of 35 mg. per week: Lab Results  Component Value Date   INR 1.7 10/03/2014   INR 1.4 09/30/2014   INR 1.20 09/26/2014    Weekly dose was unchanged. Dose titration is conservative and goal INR is 2.0-2.3 due to recent bleeding. Plan to see patient back in 4 days and adjust dose if needed.  ANTI-COAG DOSE: INR as of 10/03/2014 and Previous Dosing Information   INR Dt INR Goal Wkly Tot Sun Mon Tue Wed Thu Fri Sat   10/03/2014 1.7 2.0-2.5 35 mg 5 mg 5 mg 5 mg 5 mg 5 mg 5 mg 5 mg    Anticoagulation Dose Instructions as of 10/03/2014     Total Sun Mon Tue Wed Thu Fri Sat   New Dose 35 mg 5 mg 5 mg 5 mg 5 mg 5 mg 5 mg 5 mg     (5 mg x 1)  (5 mg x 1)  (5 mg x 1)  (5 mg x 1)  (5 mg x 1)  (5 mg x 1)  (5 mg x 1)                           ANTICOAG SUMMARY: Anticoagulation Episode Summary   Current INR goal 2.0-2.5  Next INR check 10/07/2014  INR from last check 1.7! (10/03/2014)  Weekly max dose   Target end date Indefinite  [facility]  Preferred lab   Send INR reminders to    Indications  Portal vein thrombosis [I81] Encounter for long-term (current) use of antiplatelets/antithrombotics [Z79.02]        Comments Target range is actually 2.0 - 2.3 (amended after initially having established when patient had GI Bleed with INR = 2.4)      Anticoagulation Care Providers   Provider Role Specialty Phone number   Annia Belt, MD  Oncology [phone]     PATIENT INSTRUCTIONS: Patient Instructions  Patient instructed to take medications as defined in the Anti-coagulation Track section of this encounter.  Patient instructed to take today's dose.  Patient verbalized understanding of these instructions.       FOLLOW-UP 10/07/14  Flossie Dibble, PharmD BCPS, BCACP

## 2014-10-03 NOTE — Patient Instructions (Signed)
Patient instructed to take medications as defined in the Anti-coagulation Track section of this encounter.  Patient instructed to take today's dose.  Patient verbalized understanding of these instructions.    

## 2014-10-03 NOTE — Progress Notes (Signed)
Pre visit review using our clinic review tool, if applicable. No additional management support is needed unless otherwise documented below in the visit note.  To recap briefly, admitted with GIB, transfused with mult imaging/endoscopy procedures done.  No bleeding since coming home from the hospital.  INR 1.7 today.  On coumadin and plavix currently.  Course d/w pt.  INR goal 2-2.3.   INR done today.  Slow inc in coumadin per heme.   Tired. Not SOB unless with exertion, with walking.   Has inc in weight recently, with inc in abd girth.  Some BLE edema.  Inc of lasix to 120mg  a day didn't help much at all.  No ADE on med.  Hasn't been on spironolactone.    PMH and SH reviewed  ROS: See HPI, otherwise noncontributory.  Meds, vitals, and allergies reviewed.   nad ncat Mmm rrr ctab Abd soft, obese, not ttp, normal BS Ext with 1-2+ edema

## 2014-10-04 NOTE — Assessment & Plan Note (Signed)
With inc in abdominal girth.  Will inc lasix to 160mg  a day for 2-3 days for a trial. BMET reviewed.  He may be a candidate for spironolactone, but I don't want to add new meds if possible. Will await update from patient. Still ctab and okay for outpatient fu.

## 2014-10-04 NOTE — Assessment & Plan Note (Signed)
None since discharge from hospital.  Continue coumadin per heme.  HGB trending up, routed to Dr. Beryle Beams with his order prev in EMR.  App help of all involved.  >25 minutes spent in face to face time with patient, >50% spent in counselling or coordination of care.

## 2014-10-05 ENCOUNTER — Telehealth: Payer: Self-pay | Admitting: *Deleted

## 2014-10-05 ENCOUNTER — Encounter (HOSPITAL_COMMUNITY): Payer: Medicare Other

## 2014-10-05 DIAGNOSIS — D509 Iron deficiency anemia, unspecified: Secondary | ICD-10-CM

## 2014-10-05 DIAGNOSIS — I81 Portal vein thrombosis: Secondary | ICD-10-CM

## 2014-10-05 NOTE — Telephone Encounter (Signed)
Pt called/informed of Hgb 9.4; OK for now per Dr Beryle Beams; pt stated he had already looked at his lab results on My Chart. Pt will schedule his CBC appt when he comes in Friday for coumadin check  - message sent to front desk.

## 2014-10-05 NOTE — Telephone Encounter (Signed)
Message copied by Ebbie Latus on Wed Oct 05, 2014  4:10 PM ------      Message from: Annia Belt      Created: Tue Oct 04, 2014  3:44 PM       Call pt: Hb 9.4  OK for now  Will continue to follow.  Please put in for repeat CBC in 2 wks ------

## 2014-10-06 ENCOUNTER — Telehealth: Payer: Self-pay

## 2014-10-06 MED ORDER — SPIRONOLACTONE 25 MG PO TABS: 25.0000 mg | ORAL_TABLET | Freq: Every day | ORAL | Status: DC

## 2014-10-06 NOTE — Telephone Encounter (Signed)
Left detailed msg on VM, as well as asked for my call to be returned

## 2014-10-06 NOTE — Telephone Encounter (Signed)
Would go back to his old dose of lasix, 80mg  a day.  Add on spironolactone in the meantime.  rx sent.  Recheck here in 1 week re: edema, BP, labs at [facility].  Need appointment set.  STOP potassium in the meantime.  Removed from med list.  Thanks.

## 2014-10-06 NOTE — Telephone Encounter (Signed)
Pt left v/m; pt was seen on 10/03/14 and pt was to increase Lasix to 160 mg daily for 2 days; pt has done that;results were negligible;no positive change after doubling Lasix. Pt wants cb to know how to proceed with instructions for Lasix since pt was supposed to take 160 mg for 2 days only.Please advise.

## 2014-10-07 ENCOUNTER — Ambulatory Visit: Payer: Medicare Other

## 2014-10-07 ENCOUNTER — Encounter (HOSPITAL_COMMUNITY): Payer: Medicare Other

## 2014-10-07 DIAGNOSIS — Z7902 Long term (current) use of antithrombotics/antiplatelets: Secondary | ICD-10-CM

## 2014-10-07 DIAGNOSIS — I81 Portal vein thrombosis: Secondary | ICD-10-CM

## 2014-10-10 ENCOUNTER — Encounter (HOSPITAL_COMMUNITY): Payer: Medicare Other

## 2014-10-10 ENCOUNTER — Ambulatory Visit (INDEPENDENT_AMBULATORY_CARE_PROVIDER_SITE_OTHER): Payer: Medicare Other | Admitting: Pharmacist

## 2014-10-10 DIAGNOSIS — Z7902 Long term (current) use of antithrombotics/antiplatelets: Secondary | ICD-10-CM

## 2014-10-10 DIAGNOSIS — I81 Portal vein thrombosis: Secondary | ICD-10-CM

## 2014-10-10 LAB — POCT INR: INR: 2.1

## 2014-10-10 NOTE — Progress Notes (Signed)
Anti-Coagulation Progress Note  CHAO BLAZEJEWSKI is a 80 y.o. male who is currently on an anti-coagulation regimen.    RECENT RESULTS: Recent results are below, the most recent result is correlated with a dose of 30 mg. per week: Lab Results  Component Value Date   INR 2.10 10/10/2014   INR 1.7 10/03/2014   INR 1.4 09/30/2014    ANTI-COAG DOSE: Anticoagulation Dose Instructions as of 10/10/2014     [name] Tue Wed Thu Fri Sat   New Dose 5 mg 2.5 mg 5 mg 5 mg 2.5 mg 5 mg 5 mg       ANTICOAG SUMMARY: Anticoagulation Episode Summary   Current INR goal 2.0-2.5  Next INR check 10/17/2014  INR from last check 2.10 (10/10/2014)  Weekly max dose   Target end date Indefinite  [facility]  Preferred lab   Send INR reminders to    Indications  Portal vein thrombosis [I81] Encounter for long-term (current) use of antiplatelets/antithrombotics [Z79.02]        Comments Target range is actually 2.0 - 2.3 (amended after initially having established when patient had GI Bleed with INR = 2.4)      Anticoagulation Care Providers   Provider Role Specialty Phone number   Annia Belt, MD  Oncology [phone]      ANTICOAG TODAY: Anticoagulation Summary as of 10/10/2014   INR goal 2.0-2.5  Selected INR 2.10 (10/10/2014)  Next INR check 10/17/2014  Target end date Indefinite   Indications  Portal vein thrombosis [I81] Encounter for long-term (current) use of antiplatelets/antithrombotics [Z79.02]      Anticoagulation Episode Summary   [facility] check location [facility]   Preferred lab    Send INR reminders to    Comments Target range is actually 2.0 - 2.3 (amended after initially having established when patient had GI Bleed with INR = 2.4)    Anticoagulation Care Providers   Provider Role Specialty Phone number   Annia Belt, MD  Oncology [phone]      PATIENT INSTRUCTIONS: Patient Instructions  Patient instructed to take  medications as defined in the Anti-coagulation Track section of this encounter.  Patient instructed to take today's dose.  Patient verbalized understanding of these instructions.       FOLLOW-UP Return in 7 days (on 10/17/2014) for Follow up INR at 0945h.  [name], III Pharm.D., CACP

## 2014-10-10 NOTE — Telephone Encounter (Signed)
Spoke to patient and was advised that he did get all of the instructions left on the voicemail. Patient confirmed that he has an appointment scheduled 10/11/14.

## 2014-10-10 NOTE — Progress Notes (Signed)
Indication: Deep venous thrombosis and portal vein thrombosis. Duration: Lifelong. INR: At target. Agree with Dr. Gladstone Pih assessment and plan.

## 2014-10-10 NOTE — Patient Instructions (Signed)
Patient instructed to take medications as defined in the Anti-coagulation Track section of this encounter.  Patient instructed to take today's dose.  Patient verbalized understanding of these instructions.    

## 2014-10-11 ENCOUNTER — Ambulatory Visit (INDEPENDENT_AMBULATORY_CARE_PROVIDER_SITE_OTHER): Payer: Medicare Other | Admitting: Family Medicine

## 2014-10-11 ENCOUNTER — Encounter: Payer: Self-pay | Admitting: Family Medicine

## 2014-10-11 VITALS — BP 108/60 | HR 66 | Temp 98.4°F | Wt 270.0 lb

## 2014-10-11 DIAGNOSIS — R609 Edema, unspecified: Secondary | ICD-10-CM

## 2014-10-11 DIAGNOSIS — I2510 Atherosclerotic heart disease of native coronary artery without angina pectoris: Secondary | ICD-10-CM

## 2014-10-11 DIAGNOSIS — I10 Essential (primary) hypertension: Secondary | ICD-10-CM

## 2014-10-11 DIAGNOSIS — Z9861 Coronary angioplasty status: Secondary | ICD-10-CM

## 2014-10-11 NOTE — Patient Instructions (Signed)
Don't change your meds for now.  When you get your blood counts rechecked, have them draw off a BMET- I already put in the order.  If they have a question, they should see the order in the computer.  We'll go from there.  Take care.

## 2014-10-11 NOTE — Progress Notes (Signed)
Pre visit review using our clinic review tool, if applicable. No additional management support is needed unless otherwise documented below in the visit note.  F/u for BP and edema.  Added on spironolactone.  Still on lasix 80mg  a day.  Due for labs.  Noted some inc in [place].  Has tolerated spironolactone.  Not lightheaded.  He continues on anticoagulation.  His SOB may be some better, minimally better.  No CP.  Not passing blood.   Has f/u for INR and CBC early next week.    Meds, vitals, and allergies reviewed.   ROS: See HPI.  Otherwise, noncontributory.  nad  ncat  Mmm  rrr  ctab  Abd soft, obese, not ttp, normal BS  Ext with 1+ edema

## 2014-10-12 ENCOUNTER — Encounter (HOSPITAL_COMMUNITY): Payer: Medicare Other

## 2014-10-12 ENCOUNTER — Telehealth: Payer: Self-pay | Admitting: Family Medicine

## 2014-10-12 DIAGNOSIS — R609 Edema, unspecified: Secondary | ICD-10-CM | POA: Insufficient documentation

## 2014-10-12 NOTE — Assessment & Plan Note (Signed)
His lungs sound clear today.  Most of the edema still appears to be abdominal > BLE.  D/w pt.  Tolerated spironolactone, will check BMET with other labs early next week to limit blood draws.  He has no bleeding in the meantime, fortunately.   We may need to inc his spironolactone, depending on his labs.  We didn't adjust his lasix today, since the prev higher dose didn't change his situation.  He agrees.  App help of all involved.

## 2014-10-12 NOTE — Telephone Encounter (Signed)
emmi emailed °

## 2014-10-13 ENCOUNTER — Telehealth (HOSPITAL_COMMUNITY): Payer: Self-pay | Admitting: *Deleted

## 2014-10-13 NOTE — Telephone Encounter (Signed)
Called to assess pt readiness to attend cardiac rehab.  Pt has appt with GI MD on tomorrow and heart failure clinic appt on 11/2.  Pt will check twith MD to see if he is ready to attend.  Will check back after the 4th.  Pt complained of feeling weak and fatigued. Cherre Huger, BSN

## 2014-10-14 ENCOUNTER — Encounter (HOSPITAL_COMMUNITY): Payer: Medicare Other

## 2014-10-14 ENCOUNTER — Ambulatory Visit (INDEPENDENT_AMBULATORY_CARE_PROVIDER_SITE_OTHER): Payer: Medicare Other | Admitting: Internal Medicine

## 2014-10-14 ENCOUNTER — Encounter: Payer: Self-pay | Admitting: Internal Medicine

## 2014-10-14 VITALS — BP 120/54 | HR 72 | Ht 71.0 in | Wt 268.1 lb

## 2014-10-14 DIAGNOSIS — Z9861 Coronary angioplasty status: Secondary | ICD-10-CM

## 2014-10-14 DIAGNOSIS — K429 Umbilical hernia without obstruction or gangrene: Secondary | ICD-10-CM

## 2014-10-14 DIAGNOSIS — K766 Portal hypertension: Secondary | ICD-10-CM | POA: Insufficient documentation

## 2014-10-14 DIAGNOSIS — R188 Other ascites: Secondary | ICD-10-CM

## 2014-10-14 DIAGNOSIS — I2510 Atherosclerotic heart disease of native coronary artery without angina pectoris: Secondary | ICD-10-CM

## 2014-10-14 DIAGNOSIS — D500 Iron deficiency anemia secondary to blood loss (chronic): Secondary | ICD-10-CM

## 2014-10-14 NOTE — Patient Instructions (Signed)
We will await your labs on Monday to make any changes in your medication.  Follow up with [place] as needed.   I appreciate the opportunity to care for you.

## 2014-10-14 NOTE — Progress Notes (Deleted)
Patient ID: James [name], male   DOB: 1928-01-24, 80 y.o.   MRN: [id number]

## 2014-10-14 NOTE — Progress Notes (Signed)
   Subjective:    Patient ID: James [name], male    DOB: Jan 17, 1928, 80 y.o.   MRN: [id number]  HPI Here for f/u anemia in setting of portal htn from portal vein thrombosis. Had hematochezia in past month, ? Diverticular bleed or hemorrhoids.  No more bleeding.  Having increased abdominal distention. Tried increase furosemide dose x 2-3 days for that and edema but did not help. Spironolactone started at 25 mg qd and has BMET pending in 3 d. (Dr. Damita Dunnings)  Medications, allergies, past medical history, past surgical history, family history and social history are reviewed and updated in the EMR.  Review of Systems     Objective:   Physical Exam .NAD Obese Abdomen is distended with prominent veins on wall, reducible umbilical hernia, non-tender Ext 1 + edema Skin pale     Assessment & Plan:  Anemia due to chronic blood loss  Ascites  Umbilical hernia without obstruction and without gangrene  Portal hypertension  1) wait on BMET to see about increasing diuretics 2) Hgb f/u Dr. Beryle Beams 3) see me prn

## 2014-10-15 NOTE — Progress Notes (Signed)
I have reviewed the anticoagulation note.

## 2014-10-17 ENCOUNTER — Ambulatory Visit (INDEPENDENT_AMBULATORY_CARE_PROVIDER_SITE_OTHER): Payer: Medicare Other | Admitting: Pharmacist

## 2014-10-17 ENCOUNTER — Encounter (HOSPITAL_COMMUNITY): Payer: Medicare Other

## 2014-10-17 ENCOUNTER — Other Ambulatory Visit (INDEPENDENT_AMBULATORY_CARE_PROVIDER_SITE_OTHER): Payer: Medicare Other

## 2014-10-17 DIAGNOSIS — I10 Essential (primary) hypertension: Secondary | ICD-10-CM

## 2014-10-17 DIAGNOSIS — I81 Portal vein thrombosis: Secondary | ICD-10-CM

## 2014-10-17 DIAGNOSIS — D509 Iron deficiency anemia, unspecified: Secondary | ICD-10-CM

## 2014-10-17 DIAGNOSIS — Z7902 Long term (current) use of antithrombotics/antiplatelets: Secondary | ICD-10-CM

## 2014-10-17 LAB — CBC
HEMATOCRIT: 28.9 % — AB (ref 39.0–52.0)
Hemoglobin: 9.2 g/dL — ABNORMAL LOW (ref 13.0–17.0)
MCH: 28.3 pg (ref 26.0–34.0)
MCHC: 31.8 g/dL (ref 30.0–36.0)
MCV: 88.9 fL (ref 78.0–100.0)
Platelets: 259 10*3/uL (ref 150–400)
RBC: 3.25 MIL/uL — AB (ref 4.22–5.81)
RDW: 18.4 % — ABNORMAL HIGH (ref 11.5–15.5)
WBC: 5.2 10*3/uL (ref 4.0–10.5)

## 2014-10-17 LAB — BASIC METABOLIC PANEL WITH GFR
BUN: 17 mg/dL (ref 6–23)
CHLORIDE: 103 meq/L (ref 96–112)
CO2: 23 meq/L (ref 19–32)
CREATININE: 1.07 mg/dL (ref 0.50–1.35)
Calcium: 9.3 mg/dL (ref 8.4–10.5)
GFR, Est African American: 73 mL/min
GFR, Est Non African American: 63 mL/min
Glucose, Bld: 143 mg/dL — ABNORMAL HIGH (ref 70–99)
POTASSIUM: 4.3 meq/L (ref 3.5–5.3)
Sodium: 140 mEq/L (ref 135–145)

## 2014-10-17 LAB — POCT INR: INR: 2

## 2014-10-17 NOTE — Progress Notes (Signed)
Anti-Coagulation Progress Note  James [name] is a 80 y.o. male who is currently on an anti-coagulation regimen.    RECENT RESULTS: Recent results are below, the most recent result is correlated with a dose of 30 mg. per week: Lab Results  Component Value Date   INR 2.00 10/17/2014   INR 2.10 10/10/2014   INR 1.7 10/03/2014    ANTI-COAG DOSE: Anticoagulation Dose Instructions as of 10/17/2014     [name] Tue Wed Thu Fri Sat   New Dose 5 mg 2.5 mg 5 mg 5 mg 5 mg 5 mg 5 mg       ANTICOAG SUMMARY: Anticoagulation Episode Summary   Current INR goal 2.0-2.5  Next INR check 10/24/2014  INR from last check 2.00 (10/17/2014)  Weekly max dose   Target end date Indefinite  [facility]  Preferred lab   Send INR reminders to    Indications  Portal vein thrombosis [I81] Encounter for long-term (current) use of antiplatelets/antithrombotics [Z79.02]        Comments Target range is actually 2.0 - 2.3 (amended after initially having established when patient had GI Bleed with INR = 2.4)      Anticoagulation Care Providers   Provider Role Specialty Phone number   Annia Belt, MD  Oncology [phone]      ANTICOAG TODAY: Anticoagulation Summary as of 10/17/2014   INR goal 2.0-2.5  Selected INR 2.00 (10/17/2014)  Next INR check 10/24/2014  Target end date Indefinite   Indications  Portal vein thrombosis [I81] Encounter for long-term (current) use of antiplatelets/antithrombotics [Z79.02]      Anticoagulation Episode Summary   [facility]   Preferred lab    Send INR reminders to    Comments Target range is actually 2.0 - 2.3 (amended after initially having established when patient had GI Bleed with INR = 2.4)    Anticoagulation Care Providers   Provider Role Specialty Phone number   Annia Belt, MD  Oncology [phone]      PATIENT INSTRUCTIONS: Patient Instructions  Patient instructed to take medications  as defined in the Anti-coagulation Track section of this encounter.  Patient instructed to take today's dose.  Patient verbalized understanding of these instructions.        FOLLOW-UP Return in 7 days (on 10/24/2014) for Follow-up INR @ 0930.   Theron Arista, PharmD Clinical Pharmacist - Resident Pager: [phone] 10/26/201510:24 AM

## 2014-10-17 NOTE — Addendum Note (Signed)
Addended by: Truddie Crumble on: 10/17/2014 09:40 AM   Modules accepted: Orders

## 2014-10-17 NOTE — Patient Instructions (Signed)
Patient instructed to take medications as defined in the Anti-coagulation Track section of this encounter.  Patient instructed to take today's dose.  Patient verbalized understanding of these instructions.    

## 2014-10-18 ENCOUNTER — Telehealth: Payer: Self-pay | Admitting: *Deleted

## 2014-10-18 ENCOUNTER — Other Ambulatory Visit: Payer: Self-pay | Admitting: Family Medicine

## 2014-10-18 DIAGNOSIS — I10 Essential (primary) hypertension: Secondary | ICD-10-CM

## 2014-10-18 MED ORDER — SPIRONOLACTONE 25 MG PO TABS: 50.0000 mg | ORAL_TABLET | Freq: Every day | ORAL | Status: DC

## 2014-10-18 NOTE — Telephone Encounter (Signed)
Message copied by Ebbie Latus on Tue Oct 18, 2014  9:36 AM ------      Message from: Annia Belt      Created: Mon Oct 17, 2014  6:10 PM       Call pt: hemoglobin holding at 9.2 ------

## 2014-10-18 NOTE — Telephone Encounter (Signed)
Pt called/informed Hgb holding @ 9.2 per Dr Beryle Beams; stated he already viewed lab results.

## 2014-10-19 ENCOUNTER — Encounter (HOSPITAL_COMMUNITY): Payer: Medicare Other

## 2014-10-19 ENCOUNTER — Other Ambulatory Visit: Payer: Self-pay | Admitting: Family Medicine

## 2014-10-19 DIAGNOSIS — Z8739 Personal history of other diseases of the musculoskeletal system and connective tissue: Secondary | ICD-10-CM

## 2014-10-19 DIAGNOSIS — I10 Essential (primary) hypertension: Secondary | ICD-10-CM

## 2014-10-19 MED ORDER — SPIRONOLACTONE 25 MG PO TABS: 50.0000 mg | ORAL_TABLET | Freq: Every day | ORAL | Status: DC

## 2014-10-21 ENCOUNTER — Encounter (HOSPITAL_COMMUNITY): Payer: Medicare Other

## 2014-10-24 ENCOUNTER — Encounter (HOSPITAL_COMMUNITY): Payer: Medicare Other

## 2014-10-24 ENCOUNTER — Other Ambulatory Visit (INDEPENDENT_AMBULATORY_CARE_PROVIDER_SITE_OTHER): Payer: Medicare Other

## 2014-10-24 ENCOUNTER — Ambulatory Visit (INDEPENDENT_AMBULATORY_CARE_PROVIDER_SITE_OTHER): Payer: Medicare Other | Admitting: Pharmacist

## 2014-10-24 ENCOUNTER — Telehealth: Payer: Self-pay | Admitting: Family Medicine

## 2014-10-24 DIAGNOSIS — I81 Portal vein thrombosis: Secondary | ICD-10-CM

## 2014-10-24 DIAGNOSIS — Z8739 Personal history of other diseases of the musculoskeletal system and connective tissue: Secondary | ICD-10-CM

## 2014-10-24 DIAGNOSIS — Z8639 Personal history of other endocrine, nutritional and metabolic disease: Secondary | ICD-10-CM

## 2014-10-24 DIAGNOSIS — I10 Essential (primary) hypertension: Secondary | ICD-10-CM

## 2014-10-24 DIAGNOSIS — Z7902 Long term (current) use of antithrombotics/antiplatelets: Secondary | ICD-10-CM

## 2014-10-24 LAB — LIPID PANEL
Cholesterol: 144 mg/dL (ref 0–200)
HDL: 28.5 mg/dL — AB (ref >39.00)
NonHDL: 115.5
Total CHOL/HDL Ratio: 5
Triglycerides: 206 mg/dL — ABNORMAL HIGH (ref 0.0–149.0)
VLDL: 41.2 mg/dL — ABNORMAL HIGH (ref 0.0–40.0)

## 2014-10-24 LAB — BASIC METABOLIC PANEL
BUN: 24 mg/dL — AB (ref 6–23)
CO2: 21 mEq/L (ref 19–32)
Calcium: 9.2 mg/dL (ref 8.4–10.5)
Chloride: 104 mEq/L (ref 96–112)
Creatinine, Ser: 1.3 mg/dL (ref 0.4–1.5)
GFR: 56.36 mL/min — AB (ref >60.00)
Glucose, Bld: 104 mg/dL — ABNORMAL HIGH (ref 70–99)
POTASSIUM: 4.3 meq/L (ref 3.5–5.1)
SODIUM: 135 meq/L (ref 135–145)

## 2014-10-24 LAB — LDL CHOLESTEROL, DIRECT: Direct LDL: 68.6 mg/dL

## 2014-10-24 LAB — POCT INR: INR: 2.5

## 2014-10-24 LAB — URIC ACID: Uric Acid, Serum: 4.4 mg/dL (ref 4.0–7.8)

## 2014-10-24 MED ORDER — SPIRONOLACTONE 25 MG PO TABS: 50.0000 mg | ORAL_TABLET | Freq: Every day | ORAL | Status: DC

## 2014-10-24 NOTE — Telephone Encounter (Signed)
Patient notified as instructed by telephone. Patient stated that he only has one spironolactone pill left and the pharmacy stated that it is too early to fill it now . Patient needs a new script sent to the pharmacy with the directions two a day so that he can get this refilled. Patient stated that he has not gotten his script from the mail order pharmacy yet. See warning. Is it okay to refill medication? Patient is waiting at the pharmacy.

## 2014-10-24 NOTE — Patient Instructions (Signed)
Patient instructed to take medications as defined in the Anti-coagulation Track section of this encounter.  Patient instructed to take today's dose.  Patient verbalized understanding of these instructions.    

## 2014-10-24 NOTE — Progress Notes (Signed)
Anti-Coagulation Progress Note  James [name] is a 80 y.o. male who is currently on an anti-coagulation regimen.    RECENT RESULTS: Recent results are below, the most recent result is correlated with a dose of 32.5 mg. per week: Lab Results  Component Value Date   INR 2.50 10/24/2014   INR 2.00 10/17/2014   INR 2.10 10/10/2014    ANTI-COAG DOSE: Anticoagulation Dose Instructions as of 10/24/2014      [name] Tue Wed Thu Fri Sat   New Dose 5 mg 2.5 mg 5 mg 5 mg 2.5 mg 5 mg 5 mg       ANTICOAG SUMMARY: Anticoagulation Episode Summary    Current INR goal 2.0-2.5  Next INR check 11/07/2014  INR from last check 2.50 (10/24/2014)  Weekly max dose   Target end date Indefinite  [facility]  Preferred lab   Send INR reminders to    Indications  Portal vein thrombosis [I81] Encounter for long-term (current) use of antiplatelets/antithrombotics [Z79.02]        Comments Target range is actually 2.0 - 2.3 (amended after initially having established when patient had GI Bleed with INR = 2.4)      Anticoagulation Care Providers    Provider Role Specialty Phone number   Annia Belt, MD  Oncology [phone]      ANTICOAG TODAY: Anticoagulation Summary as of 10/24/2014    INR goal 2.0-2.5  Selected INR 2.50 (10/24/2014)  Next INR check 11/07/2014  Target end date Indefinite   Indications  Portal vein thrombosis [I81] Encounter for long-term (current) use of antiplatelets/antithrombotics [Z79.02]      Anticoagulation Episode Summary    [facility]   Preferred lab    Send INR reminders to    Comments Target range is actually 2.0 - 2.3 (amended after initially having established when patient had GI Bleed with INR = 2.4)    Anticoagulation Care Providers    Provider Role Specialty Phone number   Annia Belt, MD  Oncology [phone]      PATIENT INSTRUCTIONS: Patient Instructions  Patient instructed to take  medications as defined in the Anti-coagulation Track section of this encounter.  Patient instructed to take today's dose.  Patient verbalized understanding of these instructions.       FOLLOW-UP Return in 2 weeks (on 11/07/2014) for Follow up INR at 0930h.  [name], III Pharm.D., CACP

## 2014-10-24 NOTE — Telephone Encounter (Signed)
Patient notified by telephone that script has been sent to the pharmacy per Dr. Duncan. 

## 2014-10-24 NOTE — Telephone Encounter (Signed)
Pt stated he had a 4 lb decrease in weight yesterday.  He stated he is on coumadin.  He stated he was told to let his md know if he had a weight change of 3lb or more

## 2014-10-24 NOTE — Telephone Encounter (Signed)
Sent. Thanks.   

## 2014-10-24 NOTE — Telephone Encounter (Signed)
Noted, likely from the higher dose of spironolactone, I'll await his pending labs.  If lightheaded, then cut the spironolactone back to 25mg  a day.  If not, then continue as is.  Thanks.

## 2014-10-26 ENCOUNTER — Ambulatory Visit (HOSPITAL_COMMUNITY)
Admission: RE | Admit: 2014-10-26 | Discharge: 2014-10-26 | Disposition: A | Discharge status: Home / Self Care | Payer: Medicare Other | Source: Ambulatory Visit | Attending: Internal Medicine | Admitting: Internal Medicine

## 2014-10-26 ENCOUNTER — Encounter (HOSPITAL_COMMUNITY): Payer: Medicare Other

## 2014-10-26 VITALS — BP 82/40 | HR 75 | Wt 255.8 lb

## 2014-10-26 DIAGNOSIS — K922 Gastrointestinal hemorrhage, unspecified: Secondary | ICD-10-CM | POA: Insufficient documentation

## 2014-10-26 DIAGNOSIS — I2510 Atherosclerotic heart disease of native coronary artery without angina pectoris: Secondary | ICD-10-CM | POA: Diagnosis not present

## 2014-10-26 DIAGNOSIS — I5032 Chronic diastolic (congestive) heart failure: Secondary | ICD-10-CM | POA: Diagnosis not present

## 2014-10-26 DIAGNOSIS — I82409 Acute embolism and thrombosis of unspecified deep veins of unspecified lower extremity: Secondary | ICD-10-CM | POA: Diagnosis not present

## 2014-10-26 DIAGNOSIS — I81 Portal vein thrombosis: Secondary | ICD-10-CM

## 2014-10-26 DIAGNOSIS — E785 Hyperlipidemia, unspecified: Secondary | ICD-10-CM | POA: Insufficient documentation

## 2014-10-26 DIAGNOSIS — Z9861 Coronary angioplasty status: Secondary | ICD-10-CM

## 2014-10-26 DIAGNOSIS — Z86718 Personal history of other venous thrombosis and embolism: Secondary | ICD-10-CM

## 2014-10-26 DIAGNOSIS — Z7901 Long term (current) use of anticoagulants: Secondary | ICD-10-CM

## 2014-10-26 DIAGNOSIS — R0600 Dyspnea, unspecified: Secondary | ICD-10-CM | POA: Insufficient documentation

## 2014-10-26 DIAGNOSIS — I959 Hypotension, unspecified: Secondary | ICD-10-CM | POA: Insufficient documentation

## 2014-10-26 LAB — BASIC METABOLIC PANEL
Anion gap: 14 (ref 5–15)
BUN: 34 mg/dL — ABNORMAL HIGH (ref 6–23)
CHLORIDE: 99 meq/L (ref 96–112)
CO2: 21 mEq/L (ref 19–32)
Calcium: 9.7 mg/dL (ref 8.4–10.5)
Creatinine, Ser: 1.55 mg/dL — ABNORMAL HIGH (ref 0.50–1.35)
GFR calc non Af Amer: 39 mL/min — ABNORMAL LOW (ref >90)
GFR, EST AFRICAN AMERICAN: 46 mL/min — AB (ref >90)
GLUCOSE: 128 mg/dL — AB (ref 70–99)
POTASSIUM: 4.9 meq/L (ref 3.7–5.3)
Sodium: 134 mEq/L — ABNORMAL LOW (ref 137–147)

## 2014-10-26 LAB — CBC
HCT: 26.4 % — ABNORMAL LOW (ref 39.0–52.0)
HEMOGLOBIN: 8.7 g/dL — AB (ref 13.0–17.0)
MCH: 28.7 pg (ref 26.0–34.0)
MCHC: 33 g/dL (ref 30.0–36.0)
MCV: 87.1 fL (ref 78.0–100.0)
Platelets: 299 10*3/uL (ref 150–400)
RBC: 3.03 MIL/uL — AB (ref 4.22–5.81)
RDW: 18.3 % — ABNORMAL HIGH (ref 11.5–15.5)
WBC: 8.2 10*3/uL (ref 4.0–10.5)

## 2014-10-26 MED ORDER — SPIRONOLACTONE 25 MG PO TABS: 25.0000 mg | ORAL_TABLET | Freq: Every day | ORAL | Status: DC

## 2014-10-26 NOTE — Patient Instructions (Signed)
Do not take Spironolactone tomorrow, restart on Friday at only 25 mg daily  Hold Micardis until SBP (top number) is greater than 110   You have been referred to Cardiac Rehab, they will contact you to schedule  You have been referred to Nutritionist, they will contact you to schedule  Your physician recommends that you schedule a follow-up appointment in: 1 month with Dr Aundra Dubin

## 2014-10-26 NOTE — Progress Notes (Signed)
Patient ID: James [name], male   DOB: February 08, 1928, 80 y.o.   MRN: [id number] PCP: Dr. Damita Dunnings Pulmonologist: Dr. Joya Gaskins  80 yo with history of nonobstructive CAD and chronic diastolic CHF presents for cardiology followup.   In 11/14, he had a left leg DVT.  Only trigger seems to have been that he sat for 2 days in a row in a theater taking a test for the [facility] prior to the DVT.  He was on Xarelto for 6 months.     Patient has been on Lasix 80 mg daily for several years now.  He has chronic exertional dyspnea.  However, over the last few months, this has considerably worsened.  He had an echo in 12/14 with EF 60-65% and aortic sclerosis without significant stenosis.  He was admitted in 6/15 with dyspnea. CTA chest was negative for PE.  Lower extremity dopplers showed a superficial thrombus but no DVT. He was found to be anemic with hemoglobin 7.9.  He got 1 unit PRBCs and IV iron.  He also was diuresed with IV Lasix.  He says that he really did not feel much better.  Repeat hemoglobins have been improved. He has not had overt GI bleeding.  At last appointment, he was short of breath walking short distances such as around his house (bedroom to kitchen).  No orthopnea or PND.  No chest pain or tightness.  No palpitations.  Lexiscan Cardiolite was done in 6/15, showing basal to mid inferior mild ischemia (overall low risk study).   Given ongoing significant exertional symptoms, I took him for [facility] in 7/15.  RHC showed normal filling pressures and cardiac output.  LHC showed a borderline LAD stenosis.  FFR was done, suggesting that the stenosis was hemodynamically significant.  Patient had DES to LAD.  He saw Dr. Beryle Beams shortly thereafter, and it was decided that he was low risk for future DVT so Xarelto was not started.   Since the PCI, patient has really noted no change to his chronic exertional dyspnea. He was diagnosed with portal vein thrombosis in 9/15 and started on coumadin.  He then had  hematochezia and was admitted to [facility] later in 9/15.  This was thought to be a diverticular bleed or hemorrhoids.  Capsule endoscopy was negative.  Last CT abdomen in 10/15 showed small ascites and subtotal portal vein occlusion.  He did not have cirrhosis. Lasix was stopped during his hospitalization and he gained weight and abdomen began to swell.  Lasix was restarted and spironolactone increased to 50 mg daily about a week ago.  His weight is down 17 lbs since last time he was in this office.  His BP is low today (has been low for about 2 days on his home cuff) and he is lightheaded with standing.  No chest pain. He has had no further BRBPR and is not taking ASA.   Labs (10/14): LDL 54, HDL 59 Labs (12/14): K 3.8, creatinine 0.9, HCT 40.3 Labs (6/15): K 3.7, creatinine 0.9, hemoglobin 7.9 => 9.4, BNP 83 Labs (7/15): K 4.2, creatinine 0.92, HCT 32.9 Labs (11/15): K 4.3, creatinine 1.3, LDL 69, HDL 28, hgb 9.2  PMH: 1. Carotid stenosis: Carotid dopplers (12/13) with 0-39% stenosis.  2. Hyperlipidemia 3. Gout 4. ITP: 5/10.  Thought to be due to Septra.  5. Obesity 6. OA 7. BPH 8. Nephrolithiasis 9. OSA 10. GERD 11. H/o melanoma 12. CAD: LHC (6/05) with EF 60%, 50% mLCx stenosis, 40% pLAD stenosis.  No intervention.  Lexiscan Cardiolite (6/15) with EF 74%, basal to mid inferior mild ischemia.   LHC (7/15) with 70% proximal LAD stenosis after D1, significant by FFR.  Patient had DES to proximal LAD.  13. DVT (11/14): Spontaneous.  APLAS serologies negative.  6/15 [place] for DVT showed no DVT but did show a superficial thrombus right lesser saphenous vein. LE doppler (06/17/14): No evidence of lower extremity DVT or incompetence, bilaterally. Acute, partially occlusive superficial thrombus in the right lesser saphenous vein, similar to that described in the report from 05/30/14. Resolved left calf DVT. Chronic non-occlusive thrombus in the left lesser saphenous vein 14. Diastolic CHF: Echo (16/10)  with EF 60-65%, aortic sclerosis without significant stenosis.  RHC (7/15) with mean RA 2, PA 22/8, mean PCWP 7, CI 2.9.  15. Symptomatic anemia 6/15, ?GI bleeding but nothing overt.  Lower GI bleed in 9/15, thought to be diverticular bleed versus hemorrhoids (on anticoagulation).  Capsule endoscopy was negative.  16. Portal vein thrombosis: Diagnosed in 9/15.  No cirrhosis noted on abdominal CT.  17. COPD: See appt with Dr Joya Gaskins 7/15.   SH: Married, lives in [place], retired, 2 kids, quit smoking in 1968.   FH: h/o CVA and CAD  ROS: All systems reviewed and negative except as per HPI.   Current Outpatient Prescriptions  Medication Sig Dispense Refill  . allopurinol (ZYLOPRIM) 300 MG tablet Take 300 mg by mouth every morning.     . Cholecalciferol (VITAMIN D3) 2000 UNITS capsule Take 2,000 Units by mouth every morning.     . clopidogrel (PLAVIX) 75 MG tablet Take 75 mg by mouth every morning.     . furosemide (LASIX) 40 MG tablet Take 80 mg by mouth daily.     Marland Kitchen LIVALO 4 MG TABS TAKE 1 TABLET AT BEDTIME 90 tablet 3  . Misc Natural Products (OSTEO BI-FLEX JOINT SHIELD PO) Take 1 tablet by mouth every morning.     . Multiple Vitamin (MULTIVITAMIN) tablet Take 1 tablet by mouth every morning.     . nitroGLYCERIN (NITROSTAT) 0.4 MG SL tablet Place 1 tablet (0.4 mg total) under the tongue every 5 (five) minutes as needed. For chest pain 25 tablet 5  . pantoprazole (PROTONIX) 40 MG tablet Take 40 mg by mouth at bedtime.    . Probiotic Product (PROBIOTIC & ACIDOPHILUS EX ST PO) Take 1 tablet by mouth every morning.     . Psyllium (METAMUCIL PO) Take 20 mLs by mouth every evening.    . ranitidine (ZANTAC) 150 MG tablet Take 150 mg by mouth at bedtime.    Marland Kitchen spironolactone (ALDACTONE) 25 MG tablet Take 1 tablet (25 mg total) by mouth daily. 60 tablet 0  . telmisartan (MICARDIS) 40 MG tablet Take 40 mg by mouth every morning.     . warfarin (COUMADIN) 5 MG tablet Take 5 mg by mouth daily.       No current facility-administered medications for this encounter.    BP 82/40 mmHg  Pulse 75  Wt 255 lb 12 oz (116.007 kg)  SpO2 98% General: NAD, obese Neck: Thick, no JVD, no thyromegaly or thyroid nodule.  Lungs: Clear to auscultation bilaterally with normal respiratory effort. CV: Nondisplaced PMI.  Heart regular S1/S2, no S3/S4, no murmur. Trace ankle edema.  No carotid bruit.  Normal pedal pulses.  Abdomen: Soft, nontender, no hepatosplenomegaly, no distention.  Skin: Intact without lesions or rashes.  Neurologic: Alert and oriented x 3.  Psych: Normal affect. Extremities: No clubbing  or cyanosis.   Assessment/Plan: 1. Dyspnea: This has been chronic. No PE on CTA chest 6/15.  He was thought to be volume overloaded at at prior appointment.  Lasix was increased but did not seem to help his symptoms much. He quit smoking years ago. LHC/RHC showed normal right and left heart filling pressures and a 70-80% proximal LAD stenosis that was hemodynamically significant by FFR.  Patient had DES to proximal LAD.  This did not help his dyspnea. Seen by pulmonary, suspect some COPD based on chest CT with emphysema and obstruction on PFTs.  - Not significantly volume overloaded on exam and probably more on the dry side.  He can continue Lasix 80 mg daily but would hold spironolactone for a couple of days and restart at 25 mg daily (prior dose).  BMET today.  - Needs to lose weight, suspect this plays a role in dyspnea.  - No longer on Spiriva.  2. Hyperlipidemia: Good LDL in 11/15, continue Livalo.  3. DVT: Occurred in 11/14.  Possibly triggered by long hours sitting in a theater seat taking a test. Serologies for APLAS were negative.  CTA chest in 6/15 was negative for PE but he had a superficial thrombus on recent lower extremity dopplers.  Repeat dopplers also showed the presence of superficial thrombus (no DVT extension).  He is now on coumadin.  4. Chronic diastolic CHF: NYHA class III  symptoms, stable.  See above discussion regarding his diuretics.  Not volume overloaded on exam today.  Referral to nutrition to help with low salt diet.  5. Hypotension: Coincides with increasing spironolactone and weight loss.  I will have him hold spironolactone a couple of days and restart it at prior dose of 25 mg daily.  He will also stop Micardis and restart it only when SBP on home cuff is running > 110.  Check BMET today.  6. CAD: s/p DES to LAD in 7/15.  This did not help exertional dyspnea.  I will have him enroll in cardiac rehab (will make referral again). He will stay off aspirin given coumadin use.  He will continue coumadin with goal INR 2-2.5 along with Plavix 75 mg daily.  He will continue Plavix x 1 year total as long as he does not re-bleed.  7. Portal vein thrombosis: ?cause.  Liver not cirrhotic on imaging.  He is on coumadin with goal INR 2-2.5.  8. GI bleeding: Lower GI bleed in 9/15.  ASA stopped, he remains on Plavix + coumadin with INR goal 2-2.5. No further overt GI bleeding. Will get CBC today.   Loralie Champagne 10/26/2014

## 2014-10-28 ENCOUNTER — Encounter (HOSPITAL_COMMUNITY): Payer: Medicare Other

## 2014-10-31 ENCOUNTER — Encounter: Payer: Self-pay | Admitting: Family Medicine

## 2014-10-31 ENCOUNTER — Encounter (HOSPITAL_COMMUNITY): Payer: Medicare Other

## 2014-10-31 ENCOUNTER — Ambulatory Visit (INDEPENDENT_AMBULATORY_CARE_PROVIDER_SITE_OTHER): Payer: Medicare Other | Admitting: Family Medicine

## 2014-10-31 VITALS — BP 118/58 | HR 74 | Temp 97.5°F | Ht 70.5 in | Wt 250.5 lb

## 2014-10-31 DIAGNOSIS — I5032 Chronic diastolic (congestive) heart failure: Secondary | ICD-10-CM

## 2014-10-31 DIAGNOSIS — K921 Melena: Secondary | ICD-10-CM

## 2014-10-31 DIAGNOSIS — Z0000 Encounter for general adult medical examination without abnormal findings: Secondary | ICD-10-CM

## 2014-10-31 DIAGNOSIS — I10 Essential (primary) hypertension: Secondary | ICD-10-CM

## 2014-10-31 DIAGNOSIS — I2510 Atherosclerotic heart disease of native coronary artery without angina pectoris: Secondary | ICD-10-CM

## 2014-10-31 DIAGNOSIS — Z9861 Coronary angioplasty status: Secondary | ICD-10-CM

## 2014-10-31 DIAGNOSIS — Z23 Encounter for immunization: Secondary | ICD-10-CM

## 2014-10-31 LAB — BASIC METABOLIC PANEL
BUN: 30 mg/dL — ABNORMAL HIGH (ref 6–23)
CALCIUM: 9.4 mg/dL (ref 8.4–10.5)
CHLORIDE: 103 meq/L (ref 96–112)
CO2: 19 mEq/L (ref 19–32)
Creatinine, Ser: 1.3 mg/dL (ref 0.4–1.5)
GFR: 55.36 mL/min — ABNORMAL LOW (ref >60.00)
GLUCOSE: 116 mg/dL — AB (ref 70–99)
Potassium: 4.5 mEq/L (ref 3.5–5.1)
SODIUM: 135 meq/L (ref 135–145)

## 2014-10-31 MED ORDER — ALLOPURINOL 300 MG PO TABS: 150.0000 mg | ORAL_TABLET | ORAL | Status: DC

## 2014-10-31 NOTE — Patient Instructions (Signed)
Go to the lab on the way out.  We'll contact you with your lab report. Cut the allopurinol back to 150mg  a day.  Stay off the micardis and spironolactone for now.  We'll be in touch.  Take care.

## 2014-10-31 NOTE — Progress Notes (Signed)
Pre visit review using our clinic review tool, if applicable. No additional management support is needed unless otherwise documented below in the visit note.  I have personally reviewed the Medicare Annual Wellness questionnaire and have noted 1. The patient's medical and social history 2. Their use of alcohol, tobacco or illicit drugs 3. Their current medications and supplements 4. The patient's functional ability including ADL's, fall risks, home safety risks and hearing or visual             impairment. 5. Diet and physical activities 6. Evidence for depression or mood disorders  The patients weight, height, BMI have been recorded in the chart and visual acuity is per eye clinic.  I have made referrals, counseling and provided education to the patient based review of the above and I have provided the pt with a written personalized care plan for preventive services.  Provider list updated- see scanned forms.  Routine anticipatory guidance given to patient.  See health maintenance.  Flu 2015 Shingles 2010 PNA 2006 Tetanus 2006 Colonoscopy 2015 Prostate cancer screening deferred for now, d/w pt.  He agrees.   Cognitive function addressed- see scanned forms- and if abnormal then additional documentation follows.   Hypertension:    Using medication without problems or lightheadedness: noted lightheaded and lower BP recently.   Chest pain with exertion:no Edema: less recently, much improved, weight is down Short of breath: at baseline Average home BPs: see above.  Inc in [place] noted.  Labs d/w pt.  Off spironolactone and ARB recently.    Gout- low uric acid level.  No ADE on allopurinol. D/w pt.  See plan.    Nausea this AM w/o vomiting. Resolved now.  Some back stools recently, now resolved.  No abd pain.    PMH and SH reviewed  Meds, vitals, and allergies reviewed.   ROS: See HPI.  Otherwise negative.    GEN: nad, alert and oriented HEENT: mucous membranes moist NECK:  supple w/o LA CV: rrr. PULM: ctab, no inc wob ABD: soft, +bs EXT: trace edema, much improved from prev SKIN: no acute rash, chronic changes noted to the BLE.

## 2014-11-02 ENCOUNTER — Encounter (HOSPITAL_COMMUNITY): Payer: Medicare Other

## 2014-11-02 ENCOUNTER — Other Ambulatory Visit: Payer: Self-pay | Admitting: Family Medicine

## 2014-11-02 DIAGNOSIS — I10 Essential (primary) hypertension: Secondary | ICD-10-CM

## 2014-11-02 DIAGNOSIS — Z0000 Encounter for general adult medical examination without abnormal findings: Secondary | ICD-10-CM | POA: Insufficient documentation

## 2014-11-02 NOTE — Assessment & Plan Note (Signed)
Flu 2015 Shingles 2010 PNA 2006 Tetanus 2006 Colonoscopy 2015 Prostate cancer screening deferred for now, d/w pt.  He agrees.   Cognitive function addressed- see scanned forms- and if abnormal then additional documentation follows.

## 2014-11-02 NOTE — Assessment & Plan Note (Signed)
His black stools have resolved for now, if continued then he'll notify me.  His EKG is w/o sig change and he has benign abd exam today.  EGK d/w pt. Would continue anticoagulation as is for now.

## 2014-11-02 NOTE — Assessment & Plan Note (Signed)
Would dec his allopurinol to 150mg  a day.  D/w pt.  He agrees.  >25 minutes spent in face to face time with patient, >50% spent in counselling or coordination of care outside of time for medicare exam.

## 2014-11-02 NOTE — Assessment & Plan Note (Signed)
With recent dec in weight and also has been lightheaded.  Will stay off the micardis and spironolactone for now, see notes on labs.

## 2014-11-04 ENCOUNTER — Encounter (HOSPITAL_COMMUNITY): Payer: Medicare Other

## 2014-11-07 ENCOUNTER — Ambulatory Visit (INDEPENDENT_AMBULATORY_CARE_PROVIDER_SITE_OTHER): Payer: Medicare Other | Admitting: Pharmacist

## 2014-11-07 ENCOUNTER — Encounter (HOSPITAL_COMMUNITY): Payer: Medicare Other

## 2014-11-07 ENCOUNTER — Other Ambulatory Visit (INDEPENDENT_AMBULATORY_CARE_PROVIDER_SITE_OTHER): Payer: Medicare Other

## 2014-11-07 DIAGNOSIS — I81 Portal vein thrombosis: Secondary | ICD-10-CM

## 2014-11-07 DIAGNOSIS — Z7902 Long term (current) use of antithrombotics/antiplatelets: Secondary | ICD-10-CM

## 2014-11-07 DIAGNOSIS — Z7901 Long term (current) use of anticoagulants: Secondary | ICD-10-CM

## 2014-11-07 DIAGNOSIS — D509 Iron deficiency anemia, unspecified: Secondary | ICD-10-CM

## 2014-11-07 LAB — COMPREHENSIVE METABOLIC PANEL
ALT: 30 U/L (ref 0–53)
AST: 21 U/L (ref 0–37)
Albumin: 3.6 g/dL (ref 3.5–5.2)
Alkaline Phosphatase: 100 U/L (ref 39–117)
BUN: 22 mg/dL (ref 6–23)
CALCIUM: 8.8 mg/dL (ref 8.4–10.5)
CHLORIDE: 100 meq/L (ref 96–112)
CO2: 18 meq/L — AB (ref 19–32)
CREATININE: 1.23 mg/dL (ref 0.50–1.35)
GLUCOSE: 169 mg/dL — AB (ref 70–99)
Potassium: 4.2 mEq/L (ref 3.5–5.3)
Sodium: 136 mEq/L (ref 135–145)
Total Bilirubin: 0.6 mg/dL (ref 0.2–1.2)
Total Protein: 6.1 g/dL (ref 6.0–8.3)

## 2014-11-07 LAB — IRON AND TIBC
%SAT: 5 % — ABNORMAL LOW (ref 20–55)
IRON: 16 ug/dL — AB (ref 42–165)
TIBC: 309 ug/dL (ref 215–435)
UIBC: 293 ug/dL (ref 125–400)

## 2014-11-07 LAB — CBC WITH DIFFERENTIAL/PLATELET
Basophils Absolute: 0 10*3/uL (ref 0.0–0.1)
Basophils Relative: 0 % (ref 0–1)
EOS PCT: 2 % (ref 0–5)
Eosinophils Absolute: 0.1 10*3/uL (ref 0.0–0.7)
HCT: 22.3 % — ABNORMAL LOW (ref 39.0–52.0)
HEMOGLOBIN: 7.1 g/dL — AB (ref 13.0–17.0)
LYMPHS ABS: 0.9 10*3/uL (ref 0.7–4.0)
Lymphocytes Relative: 15 % (ref 12–46)
MCH: 28.3 pg (ref 26.0–34.0)
MCHC: 31.8 g/dL (ref 30.0–36.0)
MCV: 88.8 fL (ref 78.0–100.0)
MONO ABS: 0.7 10*3/uL (ref 0.1–1.0)
MONOS PCT: 12 % (ref 3–12)
MPV: 9 fL — AB (ref 9.4–12.4)
Neutro Abs: 4 10*3/uL (ref 1.7–7.7)
Neutrophils Relative %: 71 % (ref 43–77)
Platelets: 290 10*3/uL (ref 150–400)
RBC: 2.51 MIL/uL — ABNORMAL LOW (ref 4.22–5.81)
RDW: 18.6 % — ABNORMAL HIGH (ref 11.5–15.5)
WBC: 5.7 10*3/uL (ref 4.0–10.5)

## 2014-11-07 LAB — FERRITIN: Ferritin: 14 ng/mL — ABNORMAL LOW (ref 22–322)

## 2014-11-07 LAB — POCT INR: INR: 1.7

## 2014-11-07 NOTE — Patient Instructions (Signed)
Patient instructed to take medications as defined in the Anti-coagulation Track section of this encounter.  Patient instructed to take today's dose.  Patient verbalized understanding of these instructions.    

## 2014-11-07 NOTE — Progress Notes (Signed)
Anti-Coagulation Progress Note  James [name] is a 80 y.o. male who is currently on an anti-coagulation regimen.    RECENT RESULTS: Recent results are below, the most recent result is correlated with a dose of 30 mg. per week: Lab Results  Component Value Date   INR 1.70 11/07/2014   INR 2.50 10/24/2014   INR 2.00 10/17/2014    ANTI-COAG DOSE: Anticoagulation Dose Instructions as of 11/07/2014      [name] Tue Wed Thu Fri Sat   New Dose 5 mg 5 mg 5 mg 5 mg 5 mg 5 mg 5 mg       ANTICOAG SUMMARY: Anticoagulation Episode Summary    Current INR goal 2.0-2.5  Next INR check 11/14/2014  INR from last check 1.70! (11/07/2014)  Weekly max dose   Target end date Indefinite  [facility]  Preferred lab   Send INR reminders to    Indications  Portal vein thrombosis [I81] Encounter for long-term (current) use of antiplatelets/antithrombotics [Z79.02]        Comments Target range is actually 2.0 - 2.3 (amended after initially having established when patient had GI Bleed with INR = 2.4)      Anticoagulation Care Providers    Provider Role Specialty Phone number   Annia Belt, MD  Oncology [phone]      ANTICOAG TODAY: Anticoagulation Summary as of 11/07/2014    INR goal 2.0-2.5  Selected INR 1.70! (11/07/2014)  Next INR check 11/14/2014  Target end date Indefinite   Indications  Portal vein thrombosis [I81] Encounter for long-term (current) use of antiplatelets/antithrombotics [Z79.02]      Anticoagulation Episode Summary    [facility]   Preferred lab    Send INR reminders to    Comments Target range is actually 2.0 - 2.3 (amended after initially having established when patient had GI Bleed with INR = 2.4)    Anticoagulation Care Providers    Provider Role Specialty Phone number   Annia Belt, MD  Oncology [phone]      PATIENT INSTRUCTIONS: Patient Instructions  Patient instructed to take  medications as defined in the Anti-coagulation Track section of this encounter.  Patient instructed to take today's dose.  Patient verbalized understanding of these instructions.       FOLLOW-UP Return in 7 days (on 11/14/2014) for Follow up INR at 1000h.  [name], III Pharm.D., CACP

## 2014-11-08 ENCOUNTER — Other Ambulatory Visit: Payer: Self-pay | Admitting: Oncology

## 2014-11-08 ENCOUNTER — Telehealth: Payer: Self-pay | Admitting: Oncology

## 2014-11-08 DIAGNOSIS — D500 Iron deficiency anemia secondary to blood loss (chronic): Secondary | ICD-10-CM

## 2014-11-08 NOTE — Telephone Encounter (Signed)
s.w. pt and advised on NOV appt...ok adn aware

## 2014-11-09 ENCOUNTER — Ambulatory Visit (HOSPITAL_COMMUNITY)
Admission: RE | Admit: 2014-11-09 | Discharge: 2014-11-09 | Disposition: A | Discharge status: Home / Self Care | Payer: Medicare Other | Source: Ambulatory Visit | Attending: Oncology | Admitting: Oncology

## 2014-11-09 ENCOUNTER — Encounter (HOSPITAL_COMMUNITY): Payer: Medicare Other

## 2014-11-09 ENCOUNTER — Other Ambulatory Visit: Payer: Self-pay

## 2014-11-09 ENCOUNTER — Other Ambulatory Visit: Payer: Self-pay | Admitting: Oncology

## 2014-11-09 ENCOUNTER — Ambulatory Visit (HOSPITAL_BASED_OUTPATIENT_CLINIC_OR_DEPARTMENT_OTHER): Payer: Medicare Other

## 2014-11-09 VITALS — BP 127/46 | HR 67 | Temp 97.7°F | Resp 18

## 2014-11-09 DIAGNOSIS — D509 Iron deficiency anemia, unspecified: Secondary | ICD-10-CM

## 2014-11-09 DIAGNOSIS — D500 Iron deficiency anemia secondary to blood loss (chronic): Secondary | ICD-10-CM | POA: Diagnosis not present

## 2014-11-09 LAB — PREPARE RBC (CROSSMATCH)

## 2014-11-09 MED ORDER — SODIUM CHLORIDE 0.9 % IV SOLN
250.0000 mL | Freq: Once | INTRAVENOUS | Status: AC
  Administered 2014-11-09: 250 mL via INTRAVENOUS

## 2014-11-09 MED ORDER — FUROSEMIDE 10 MG/ML IJ SOLN
20.0000 mg | Freq: Once | INTRAMUSCULAR | Status: AC
  Administered 2014-11-09: 20 mg via INTRAVENOUS

## 2014-11-09 MED ORDER — SODIUM CHLORIDE 0.9 % IJ SOLN
3.0000 mL | INTRAMUSCULAR | Status: DC | PRN
  Filled 2014-11-09: qty 10

## 2014-11-09 NOTE — Patient Instructions (Signed)

## 2014-11-10 ENCOUNTER — Ambulatory Visit (HOSPITAL_BASED_OUTPATIENT_CLINIC_OR_DEPARTMENT_OTHER): Payer: Medicare Other

## 2014-11-10 ENCOUNTER — Other Ambulatory Visit: Payer: Self-pay | Admitting: Oncology

## 2014-11-10 VITALS — BP 140/46 | HR 67 | Temp 97.9°F | Resp 18

## 2014-11-10 DIAGNOSIS — D500 Iron deficiency anemia secondary to blood loss (chronic): Secondary | ICD-10-CM

## 2014-11-10 DIAGNOSIS — D509 Iron deficiency anemia, unspecified: Secondary | ICD-10-CM

## 2014-11-10 LAB — PREPARE RBC (CROSSMATCH)

## 2014-11-10 MED ORDER — FUROSEMIDE 10 MG/ML IJ SOLN
20.0000 mg | Freq: Once | INTRAMUSCULAR | Status: AC
  Administered 2014-11-10: 20 mg via INTRAVENOUS

## 2014-11-10 MED ORDER — SODIUM CHLORIDE 0.9 % IV SOLN
250.0000 mL | Freq: Once | INTRAVENOUS | Status: AC
  Administered 2014-11-10: 250 mL via INTRAVENOUS

## 2014-11-10 NOTE — Patient Instructions (Signed)

## 2014-11-11 ENCOUNTER — Other Ambulatory Visit: Payer: Self-pay | Admitting: Family Medicine

## 2014-11-11 ENCOUNTER — Encounter (HOSPITAL_COMMUNITY): Payer: Medicare Other

## 2014-11-11 LAB — TYPE AND SCREEN
ABO/RH(D): A NEG
Antibody Screen: NEGATIVE
UNIT DIVISION: 0
Unit division: 0

## 2014-11-14 ENCOUNTER — Encounter: Payer: Self-pay | Admitting: Oncology

## 2014-11-14 ENCOUNTER — Ambulatory Visit (INDEPENDENT_AMBULATORY_CARE_PROVIDER_SITE_OTHER): Payer: Medicare Other | Admitting: Oncology

## 2014-11-14 ENCOUNTER — Ambulatory Visit (INDEPENDENT_AMBULATORY_CARE_PROVIDER_SITE_OTHER): Payer: Medicare Other | Admitting: Pharmacist

## 2014-11-14 ENCOUNTER — Encounter (HOSPITAL_COMMUNITY): Payer: Medicare Other

## 2014-11-14 VITALS — BP 117/49 | HR 71 | Temp 97.3°F | Ht 71.5 in | Wt 257.9 lb

## 2014-11-14 DIAGNOSIS — Z7902 Long term (current) use of antithrombotics/antiplatelets: Secondary | ICD-10-CM

## 2014-11-14 DIAGNOSIS — Z7901 Long term (current) use of anticoagulants: Secondary | ICD-10-CM

## 2014-11-14 DIAGNOSIS — I81 Portal vein thrombosis: Secondary | ICD-10-CM

## 2014-11-14 DIAGNOSIS — I8001 Phlebitis and thrombophlebitis of superficial vessels of right lower extremity: Secondary | ICD-10-CM

## 2014-11-14 DIAGNOSIS — K766 Portal hypertension: Secondary | ICD-10-CM

## 2014-11-14 DIAGNOSIS — I5032 Chronic diastolic (congestive) heart failure: Secondary | ICD-10-CM

## 2014-11-14 DIAGNOSIS — K922 Gastrointestinal hemorrhage, unspecified: Secondary | ICD-10-CM

## 2014-11-14 DIAGNOSIS — Z9861 Coronary angioplasty status: Secondary | ICD-10-CM

## 2014-11-14 DIAGNOSIS — Z86718 Personal history of other venous thrombosis and embolism: Secondary | ICD-10-CM

## 2014-11-14 DIAGNOSIS — E611 Iron deficiency: Secondary | ICD-10-CM

## 2014-11-14 DIAGNOSIS — I2510 Atherosclerotic heart disease of native coronary artery without angina pectoris: Secondary | ICD-10-CM

## 2014-11-14 LAB — IRON AND TIBC
%SAT: 5 % — ABNORMAL LOW (ref 20–55)
IRON: 15 ug/dL — AB (ref 42–165)
TIBC: 314 ug/dL (ref 215–435)
UIBC: 299 ug/dL (ref 125–400)

## 2014-11-14 LAB — CBC WITH DIFFERENTIAL/PLATELET
BASOS ABS: 0.1 10*3/uL (ref 0.0–0.1)
Basophils Relative: 1 % (ref 0–1)
Eosinophils Absolute: 0.2 10*3/uL (ref 0.0–0.7)
Eosinophils Relative: 3 % (ref 0–5)
HEMATOCRIT: 26.8 % — AB (ref 39.0–52.0)
Hemoglobin: 8.6 g/dL — ABNORMAL LOW (ref 13.0–17.0)
Lymphocytes Relative: 18 % (ref 12–46)
Lymphs Abs: 1.1 10*3/uL (ref 0.7–4.0)
MCH: 27 pg (ref 26.0–34.0)
MCHC: 32.1 g/dL (ref 30.0–36.0)
MCV: 84.3 fL (ref 78.0–100.0)
MONOS PCT: 16 % — AB (ref 3–12)
MPV: 9 fL — ABNORMAL LOW (ref 9.4–12.4)
Monocytes Absolute: 1 10*3/uL (ref 0.1–1.0)
NEUTROS ABS: 3.9 10*3/uL (ref 1.7–7.7)
NEUTROS PCT: 62 % (ref 43–77)
Platelets: 295 10*3/uL (ref 150–400)
RBC: 3.18 MIL/uL — ABNORMAL LOW (ref 4.22–5.81)
RDW: 17.7 % — ABNORMAL HIGH (ref 11.5–15.5)
WBC: 6.3 10*3/uL (ref 4.0–10.5)

## 2014-11-14 LAB — FERRITIN: Ferritin: 14 ng/mL — ABNORMAL LOW (ref 22–322)

## 2014-11-14 LAB — RETICULOCYTES
ABS Retic: 60.4 10*3/uL (ref 19.0–186.0)
RBC.: 3.18 MIL/uL — AB (ref 4.22–5.81)
RETIC CT PCT: 1.9 % (ref 0.4–2.3)

## 2014-11-14 LAB — POCT INR: INR: 1.8

## 2014-11-14 NOTE — Progress Notes (Signed)
Patient ID: James [name], male   DOB: 26-Sep-1928, 79 y.o.   MRN: [id number] Hematology and Oncology Follow Up Visit  James [name] [id number] [birth year] 81 y.o. 11/14/2014 12:16 PM   Principle Diagnosis: Encounter Diagnoses  Name Primary?  . Thrombophlebitis of superficial veins of right lower extremity   . Encounter for long-term (current) use of antiplatelets/antithrombotics   . Portal hypertension   . Gastrointestinal hemorrhage, unspecified gastritis, unspecified gastrointestinal hemorrhage type Yes     Interim History:   Clinical summary: Copied and amended from 09/05/2014 progress note: 80 year old man initially diagnosed with ITP in 2010, likely drug related (Septra) which completely resolved with a short course of steroids and discontinuation of medication. I saw him  in December 2014 after he sustained a left lower extremity DVT in the posterior tibial and peroneal veins documented on a Doppler study done 11/01/2013. He was anticoagulated for 6 months with Xarelto.  I saw him again on 06/23/2014 when he was admitted with a sudden unexplained fall in his hemoglobin from baseline 13 g in December 2014 down to 7.9 g at time of 05/29/2014  admission. This appeared to be iron deficiency with a low ferritin. No gross evidence for acute hemorrhage but it was suspected that he may have had a subacute bleed while on the Xarelto which had recently been stopped. He was given one unit of blood. He was started on iron.  He underwent a cardiac evaluation for progressive dyspnea and echocardiogram findings of diastolic dysfunction. He had a 60-70% proximal LAD lesion on cardiac catheterization July 1 and a drug eluting stent was placed. He was put on aspirin and Plavix. The Xarelto was discontinued.  He subsequently underwent outpatient upper endoscopy and colonoscopy on August 17. He was unexpectedly found to have mid and distal esophageal varices and evidence of early portal hypertension. 2  tiny sessile polyps noted in the cecum and ascending colon which were not removed due to his Plavix. No malignant changes in the colon or upper aerodigestive tract.  In view of the endoscopy findings, he underwent an MRI scan of his liver on 08/20/2014. There were vascular abnormalities in the area of the right portal vein but no parenchymal lesions in the liver and no other morphologic changes that would be typically seen with cirrhosis. There was fatty atrophy of the pancreas but fatty changes in the liver were not mentioned. This study was followed with a noninvasive color and duplex Doppler ultrasound of the liver done on September 4 which showed complete occlusion of the main, right, and left portal veins. Splenic vein was patent. Of note at no time did the patient have any abdominal pain.  Initial impression was that the portal vein thrombosis was chronic however I obtained a d-dimer study on the patient today which is significantly elevated at 6.08 units. It was 3.13 units at time of his left lower extremity DVT but had normalized down to 0.35 units via 05/05/2014. I got additional history from his daughter today who reports that one of the patient's sisters also had portal vein thrombosis and another sister has also had problems with blood clots. He only had a partial coagulation evaluation evaluation back in January to rule out presence of antiphospholipid antibodies.  Interim history: I obtained additional studies. Lupus type anticoagulant not detected when assessed on 11/24/2013 and again on 09/19/2014. No elevation of anticardiolipin antibodies or antibodies to beta 2 glycoprotein 1. Normal protein S and protein C activities 09/05/2014. Normal antithrombin III level.  Factor V Leiden and prothrombin gene mutations not detected. I felt that he needed to be back on anticoagulation. I felt Coumadin would be safer since it could be reversed. Ferritin level was down to 14. He was given 2 doses of  parenteral iron one week apart on September 15 and September 22. Unfortunately his hemoglobin fell again and he was readmitted to the hospital on September 27 when he presented with hematochezia. INR was low therapeutic at 2.4. Hemoglobin down from 9.8 in September  To 8.6. I saw him in the hospital. My suspicion is that he had AVMs of the small bowel not seen on recent endoscopies. Coumadin was held. He was given vitamin K. He was seen in follow-up by GI. He did have a capsule endoscopy study which was normal. Flexible sigmoidoscopy with anoscopy done on September 30 was unremarkable except for a sessile polyp found in the ascending colon. No blood was seen.  He again received blood transfusions on September 27 and on October 6.  I put him back on anticoagulation with target INR 2-2 0.5. Although he has not seen any melena or hematochezia since the hospital stay, hemoglobin has drifted down again to 7.1 on November 16 and I arranged outpatient transfusion on November 18 and 19th. I repeated iron studies on the 16th and ferritin remains low at 14. Serum iron 16. TIBC 309, percent saturation 5. All of these findings are consistent with ongoing blood loss. 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 He is getting some occasional sharp pains in his abdomen which lasts for just a few seconds.      Principle Diagnosis: Encounter Diagnoses  Name Primary?  . Thrombophlebitis of superficial veins of right lower extremity   . Encounter for long-term (current) use of antiplatelets/antithrombotics   . Portal hypertension   . Gastrointestinal hemorrhage, unspecified gastritis, unspecified gastrointestinal hemorrhage type Yes   Medications: Current outpatient prescriptions: allopurinol (ZYLOPRIM) 300 MG tablet, Take 0.5 tablets (150 mg total) by mouth every morning., Disp: , Rfl: ;  allopurinol (ZYLOPRIM) 300 MG tablet, TAKE 1 TABLET DAILY, Disp: 90 tablet, Rfl: 1;  Cholecalciferol (VITAMIN D3) 2000  UNITS capsule, Take 2,000 Units by mouth every morning. , Disp: , Rfl: ;  clopidogrel (PLAVIX) 75 MG tablet, Take 75 mg by mouth every morning. , Disp: , Rfl:  furosemide (LASIX) 40 MG tablet, Take 80 mg by mouth daily. , Disp: , Rfl: ;  LIVALO 4 MG TABS, TAKE 1 TABLET AT BEDTIME, Disp: 90 tablet, Rfl: 3;  Misc Natural Products (OSTEO BI-FLEX JOINT SHIELD PO), Take 1 tablet by mouth every morning. , Disp: , Rfl: ;  Multiple Vitamin (MULTIVITAMIN) tablet, Take 1 tablet by mouth every morning. , Disp: , Rfl:  nitroGLYCERIN (NITROSTAT) 0.4 MG SL tablet, Place 1 tablet (0.4 mg total) under the tongue every 5 (five) minutes as needed. For chest pain, Disp: 25 tablet, Rfl: 5;  pantoprazole (PROTONIX) 40 MG tablet, Take 40 mg by mouth at bedtime., Disp: , Rfl: ;  Probiotic Product (PROBIOTIC & ACIDOPHILUS EX ST PO), Take 1 tablet by mouth every morning. , Disp: , Rfl: ;  Psyllium (METAMUCIL PO), Take 20 mLs by mouth every evening., Disp: , Rfl:  ranitidine (ZANTAC) 150 MG tablet, Take 150 mg by mouth at bedtime., Disp: , Rfl: ;  spironolactone (ALDACTONE) 25 MG tablet, Take 1 tablet (25 mg total) by mouth daily. (Patient not taking: Reported on 11/14/2014), Disp: 60 tablet, Rfl: 0;  telmisartan (MICARDIS) 40 MG tablet,  Take 40 mg by mouth every morning. , Disp: , Rfl: ;  warfarin (COUMADIN) 5 MG tablet, Take 5 mg by mouth daily. , Disp: , Rfl:   He stopped taking the Aldactone.  Allergies:  Allergies  Allergen Reactions  . Aleve [Naproxen Sodium] Other (See Comments)    Causes platlet drop  . Rofecoxib Other (See Comments)    Pt doesn't remember  . Sulfa Antibiotics Other (See Comments)    Pt doesn't remember  . Sulfamethoxazole-Trimethoprim Other (See Comments)     Causes low platelets and bleeding  . Tramadol Hcl Other (See Comments)    dizziness, drowsiness  . Neomycin-Bacitracin Zn-Polymyx Rash  . Neosporin [Neomycin-Polymyxin-Gramicidin] Rash  . Penicillins Swelling and Rash    Review of  Systems: See history of present illness Remaining ROS negative:   Physical Exam: Blood pressure 117/49, pulse 71, temperature 97.3 F (36.3 C), temperature source Oral, height 5' 11.5" (1.816 m), weight 257 lb 14.4 oz (116.983 kg), SpO2 100 %. Wt Readings from Last 3 Encounters:  11/14/14 257 lb 14.4 oz (116.983 kg)  10/31/14 250 lb 8 oz (113.626 kg)  10/26/14 255 lb 12 oz (116.007 kg)     General appearance: Well-nourished Caucasian man HENNT: Pharynx no erythema, exudate, mass, or ulcer. No thyromegaly or thyroid nodules Lymph nodes: No cervical, supraclavicular, or axillary lymphadenopathy Breasts:  Lungs: Clear to auscultation, resonant to percussion throughout Heart: Regular rhythm, no murmur, no gallop, no rub, no click, no edema Abdomen: Soft, nontender, normal bowel sounds, no mass, no organomegaly Extremities: No edema, no calf tenderness Musculoskeletal: no joint deformities GU:  Vascular: Carotid pulses 2+, no bruits,  Neurologic: Alert, oriented, PERRLA,  cranial nerves grossly normal, motor strength 5 over 5, reflexes 1+ symmetric, upper body coordination normal, gait normal, Skin: No rash or ecchymosis  Lab Results: CBC W/Diff    Component Value Date/Time   WBC 5.7 11/07/2014 0921   WBC 7.6 11/24/2013 0812   RBC 2.51* 11/07/2014 0921   RBC 3.51* 09/05/2014 1009   RBC 4.28 11/24/2013 0812   HGB 7.1* 11/07/2014 0921   HGB 13.0 11/24/2013 0812   HCT 22.3* 11/07/2014 0921   HCT 40.3 11/24/2013 0812   PLT 290 11/07/2014 0921   PLT 271 11/24/2013 0812   MCV 88.8 11/07/2014 0921   MCV 94.2 11/24/2013 0812   MCH 28.3 11/07/2014 0921   MCH 30.4 11/24/2013 0812   MCHC 31.8 11/07/2014 0921   MCHC 32.3 11/24/2013 0812   RDW 18.6* 11/07/2014 0921   RDW 14.7* 11/24/2013 0812   LYMPHSABS 0.9 11/07/2014 0921   LYMPHSABS 1.9 11/24/2013 0812   MONOABS 0.7 11/07/2014 0921   MONOABS 0.4 11/24/2013 0812   EOSABS 0.1 11/07/2014 0921   EOSABS 0.2 11/24/2013 0812    BASOSABS 0.0 11/07/2014 0921   BASOSABS 0.0 11/24/2013 0812     Chemistry      Component Value Date/Time   NA 136 11/07/2014 0921   NA 141 11/24/2013 0813   K 4.2 11/07/2014 0921   K 3.8 11/24/2013 0813   CL 100 11/07/2014 0921   CO2 18* 11/07/2014 0921   CO2 23 11/24/2013 0813   BUN 22 11/07/2014 0921   BUN 12.3 11/24/2013 0813   CREATININE 1.23 11/07/2014 0921   CREATININE 1.3 10/31/2014 1006   CREATININE 0.9 11/24/2013 0813      Component Value Date/Time   CALCIUM 8.8 11/07/2014 0921   CALCIUM 9.5 11/24/2013 0813   ALKPHOS 100 11/07/2014 0921   ALKPHOS 87  11/24/2013 0813   AST 21 11/07/2014 0921   AST 25 11/24/2013 0813   ALT 30 11/07/2014 0921   ALT 26 11/24/2013 0813   BILITOT 0.6 11/07/2014 0921   BILITOT 0.57 11/24/2013 0813       Radiological Studies: No results found.  Impression:    CC: Patient Care Team: Tonia Ghent, MD as PCP - General [name], MD as Consulting Physician (Pulmonary Disease) Larey Dresser, MD as Consulting Physician (Cardiology) Annia Belt, MD as Consulting Physician (Oncology) Gatha Mayer, MD as Consulting Physician (Gastroenterology)   Annia Belt, MD 11/23/201512:16 PM   Medications: reviewed  Allergies:  Allergies  Allergen Reactions  . Aleve [Naproxen Sodium] Other (See Comments)    Causes platlet drop  . Rofecoxib Other (See Comments)    Pt doesn't remember  . Sulfa Antibiotics Other (See Comments)    Pt doesn't remember  . Sulfamethoxazole-Trimethoprim Other (See Comments)     Causes low platelets and bleeding  . Tramadol Hcl Other (See Comments)    dizziness, drowsiness  . Neomycin-Bacitracin Zn-Polymyx Rash  . Neosporin [Neomycin-Polymyxin-Gramicidin] Rash  . Penicillins Swelling and Rash    Review of Systems: See history of present illness Remaining ROS negative:   Physical Exam: Blood pressure 117/49, pulse 71, temperature 97.3 F (36.3 C), temperature source Oral,  height 5' 11.5" (1.816 m), weight 257 lb 14.4 oz (116.983 kg), SpO2 100 %. Wt Readings from Last 3 Encounters:  11/14/14 257 lb 14.4 oz (116.983 kg)  10/31/14 250 lb 8 oz (113.626 kg)  10/26/14 255 lb 12 oz (116.007 kg)     General appearance: Well-nourished Caucasian man HENNT: Pharynx no erythema, exudate, mass, or ulcer. No thyromegaly or thyroid nodules Lymph nodes: No cervical, supraclavicular, or axillary lymphadenopathy Breasts:  Lungs: Clear to auscultation, resonant to percussion throughout Heart: Regular rhythm, no murmur, no gallop, no rub, no click, no edema Abdomen: Soft, nontender, normal bowel sounds, no mass, no organomegaly Extremities: Significant decrease in chronic edema, no calf tenderness, calf measurements are symmetric bilaterally. There is an unusual linear rash both legs which I believe is just contraction of the skin after significant diuresis. Musculoskeletal: no joint deformities GU:  Vascular: Carotid pulses 2+, no bruits,  Neurologic: Alert, oriented, PERRLA,  cranial nerves grossly normal, motor strength 5 over 5, reflexes 1+ symmetric, upper body coordination normal, gait normal, Skin: No rash or ecchymosis  Lab Results: CBC W/Diff    Component Value Date/Time   WBC 5.7 11/07/2014 0921   WBC 7.6 11/24/2013 0812   RBC 2.51* 11/07/2014 0921   RBC 3.51* 09/05/2014 1009   RBC 4.28 11/24/2013 0812   HGB 7.1* 11/07/2014 0921   HGB 13.0 11/24/2013 0812   HCT 22.3* 11/07/2014 0921   HCT 40.3 11/24/2013 0812   PLT 290 11/07/2014 0921   PLT 271 11/24/2013 0812   MCV 88.8 11/07/2014 0921   MCV 94.2 11/24/2013 0812   MCH 28.3 11/07/2014 0921   MCH 30.4 11/24/2013 0812   MCHC 31.8 11/07/2014 0921   MCHC 32.3 11/24/2013 0812   RDW 18.6* 11/07/2014 0921   RDW 14.7* 11/24/2013 0812   LYMPHSABS 0.9 11/07/2014 0921   LYMPHSABS 1.9 11/24/2013 0812   MONOABS 0.7 11/07/2014 0921   MONOABS 0.4 11/24/2013 0812   EOSABS 0.1 11/07/2014 0921   EOSABS 0.2  11/24/2013 0812   BASOSABS 0.0 11/07/2014 0921   BASOSABS 0.0 11/24/2013 0812     Chemistry      Component Value  Date/Time   NA 136 11/07/2014 0921   NA 141 11/24/2013 0813   K 4.2 11/07/2014 0921   K 3.8 11/24/2013 0813   CL 100 11/07/2014 0921   CO2 18* 11/07/2014 0921   CO2 23 11/24/2013 0813   BUN 22 11/07/2014 0921   BUN 12.3 11/24/2013 0813   CREATININE 1.23 11/07/2014 0921   CREATININE 1.3 10/31/2014 1006   CREATININE 0.9 11/24/2013 0813      Component Value Date/Time   CALCIUM 8.8 11/07/2014 0921   CALCIUM 9.5 11/24/2013 0813   ALKPHOS 100 11/07/2014 0921   ALKPHOS 87 11/24/2013 0813   AST 21 11/07/2014 0921   AST 25 11/24/2013 0813   ALT 30 11/07/2014 0921   ALT 26 11/24/2013 0813   BILITOT 0.6 11/07/2014 0921   BILITOT 0.57 11/24/2013 0813    INR: 1.8   Radiological Studies: No results found.  Impression:  #1. Ongoing blood loss.  I still suspect small bowel AVMs although we were not able to document this on recent capsule endoscopy study. He is clearly iron deficient. He has needed frequent transfusions and has already had 2 doses of parenteral iron back in September. Today's lab results are pending. He just received blood again last week. I will likely schedule additional parenteral iron. I'm going to put him on a standing order for CBCs on a monthly basis. I gave him 3 stool guaiac cards and asked him to check his stools every other day for 1 week and return the cards to [place]. I think it is important to document that he is in fact losing blood from his GI tract.  #2. Recent portal vein thrombosis August 2015 with prior history of left lower extremity DVT December 2014. 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 He needs to stay on anticoagulation and we need to work around his GI issues. We will continue to target a low therapeutic INR. Labs are being monitored through the internal medicine Coumadin clinic.  #3. Significant  coronary artery disease with diastolic dysfunction and chronic congestive failure. He is followed closely by cardiology Dr. Aundra Dubin and Dr. Radford Pax. He is on Plavix in addition to Coumadin. Aspirin was stopped when Coumadin was started.   CC: Patient Care Team: Tonia Ghent, MD as PCP - General [name], MD as Consulting Physician (Pulmonary Disease) Larey Dresser, MD as Consulting Physician (Cardiology) Annia Belt, MD as Consulting Physician (Oncology) Gatha Mayer, MD as Consulting Physician (Gastroenterology)   Annia Belt, MD 11/23/201512:16 PM

## 2014-11-14 NOTE — Patient Instructions (Signed)
Patient instructed to take medications as defined in the Anti-coagulation Track section of this encounter.  Patient instructed to take today's dose.  Patient verbalized understanding of these instructions.    

## 2014-11-14 NOTE — Patient Instructions (Signed)
To lab today Then monthly lab checks next 12/06/14 Visit with Dr Darnell Level in 3-4 months

## 2014-11-14 NOTE — Progress Notes (Signed)
Anti-Coagulation Progress Note  James [name] is a 80 y.o. male who is currently on an anti-coagulation regimen.    RECENT RESULTS: Recent results are below, the most recent result is correlated with a dose of 35 mg. per week:  Will increase to 40mg /wk (5mg  all days of week as 1 tablet; on Mondays/Thursdays--will take 1&1/2 x 5mg  = 7.5mg ). Lab Results  Component Value Date   INR 1.80 11/14/2014   INR 1.70 11/07/2014   INR 2.50 10/24/2014    ANTI-COAG DOSE: Anticoagulation Dose Instructions as of 11/14/2014      [name] Tue Wed Thu Fri Sat   New Dose 5 mg 7.5 mg 5 mg 5 mg 7.5 mg 5 mg 5 mg       ANTICOAG SUMMARY: Anticoagulation Episode Summary    Current INR goal 2.0-2.5  Next INR check 11/21/2014  INR from last check 1.80! (11/14/2014)  Weekly max dose   Target end date Indefinite  [facility]  Preferred lab   Send INR reminders to    Indications  Portal vein thrombosis [I81] Encounter for long-term (current) use of antiplatelets/antithrombotics [Z79.02]        Comments Target range is actually 2.0 - 2.3 (amended after initially having established when patient had GI Bleed with INR = 2.4)      Anticoagulation Care Providers    Provider Role Specialty Phone number   Annia Belt, MD  Oncology [phone]      ANTICOAG TODAY: Anticoagulation Summary as of 11/14/2014    INR goal 2.0-2.5  Selected INR 1.80! (11/14/2014)  Next INR check 11/21/2014  Target end date Indefinite   Indications  Portal vein thrombosis [I81] Encounter for long-term (current) use of antiplatelets/antithrombotics [Z79.02]      Anticoagulation Episode Summary    [facility]   Preferred lab    Send INR reminders to    Comments Target range is actually 2.0 - 2.3 (amended after initially having established when patient had GI Bleed with INR = 2.4)    Anticoagulation Care Providers    Provider Role Specialty Phone number   Annia Belt, MD  Oncology [phone]      PATIENT INSTRUCTIONS: Patient Instructions  Patient instructed to take medications as defined in the Anti-coagulation Track section of this encounter.  Patient instructed to take today's dose.  Patient verbalized understanding of these instructions.       FOLLOW-UP Return in 7 days (on 11/21/2014) for Follow up INR at 0945h.  [name], III Pharm.D., CACP

## 2014-11-15 ENCOUNTER — Telehealth: Payer: Self-pay | Admitting: *Deleted

## 2014-11-15 ENCOUNTER — Other Ambulatory Visit: Payer: Self-pay | Admitting: Oncology

## 2014-11-15 ENCOUNTER — Telehealth (HOSPITAL_COMMUNITY): Payer: Self-pay | Admitting: *Deleted

## 2014-11-15 NOTE — Telephone Encounter (Signed)
-----   Message from Annia Belt, MD sent at 11/15/2014  9:44 AM EST ----- Call pt:  Hemoglobin 8.6  OK for now.  I would like to schedule another dose of IV iron - can do at [facility] center - after Thanksgiving - tell me day that is convenient and I will put in orders

## 2014-11-15 NOTE — Telephone Encounter (Signed)
Pt called/informed Hgb is 8.6 which is ok per Dr Beryle Beams and would like to schedule another dose of IV iron. Pt stated Monday Nov 30 or another day next week would be good; in the morning. Thanks

## 2014-11-15 NOTE — Telephone Encounter (Signed)
-----   Message from Annia Belt, MD sent at 11/10/2014  7:14 PM EST ----- Regarding: RE: ok to start cardiac rehab OK to try but in view of recurrent, significant, anemia, I am not sure he will be able to comply with the program. ----- Message -----    From: Rowe Pavy, RN    Sent: 11/10/2014   3:56 PM      To: Annia Belt, MD Subject: ok to start cardiac rehab                       Pt referred to cardiac rehab s/p coronary stents on 7/1//2015.  Presently on hold due to ongoing medical issues that prevented him from exercising.  Pt reported that he received blood transfusion due to anemia hgb 7.1 on today.  Pt will follow up with you on Monday.  Please assess pt readiness to participate in exercise.  He would attend three times a week, 30 minutes of exercise along with warm up and cool down.  From cardiac standpoint, Dr. Aundra Dubin has cleared him to begin. What are you thoughts?  Thank you for the advisement  Maurice Small RN

## 2014-11-16 ENCOUNTER — Encounter (HOSPITAL_COMMUNITY): Payer: Medicare Other

## 2014-11-18 ENCOUNTER — Encounter (HOSPITAL_COMMUNITY): Payer: Medicare Other

## 2014-11-21 ENCOUNTER — Other Ambulatory Visit: Payer: Medicare Other

## 2014-11-21 ENCOUNTER — Encounter (HOSPITAL_COMMUNITY): Payer: Medicare Other

## 2014-11-21 ENCOUNTER — Telehealth: Payer: Self-pay | Admitting: *Deleted

## 2014-11-21 ENCOUNTER — Ambulatory Visit (INDEPENDENT_AMBULATORY_CARE_PROVIDER_SITE_OTHER): Payer: Medicare Other | Admitting: Pharmacist

## 2014-11-21 DIAGNOSIS — Z7902 Long term (current) use of antithrombotics/antiplatelets: Secondary | ICD-10-CM

## 2014-11-21 DIAGNOSIS — Z7901 Long term (current) use of anticoagulants: Secondary | ICD-10-CM

## 2014-11-21 DIAGNOSIS — I81 Portal vein thrombosis: Secondary | ICD-10-CM

## 2014-11-21 LAB — URINALYSIS, ROUTINE W REFLEX MICROSCOPIC
Bilirubin Urine: NEGATIVE
GLUCOSE, UA: NEGATIVE mg/dL
Ketones, ur: NEGATIVE mg/dL
Nitrite: NEGATIVE
PH: 5.5 (ref 5.0–8.0)
Protein, ur: 30 mg/dL — AB
SPECIFIC GRAVITY, URINE: 1.022 (ref 1.005–1.030)
Urobilinogen, UA: 0.2 mg/dL (ref 0.0–1.0)

## 2014-11-21 LAB — URINALYSIS, MICROSCOPIC ONLY
CASTS: NONE SEEN
Crystals: NONE SEEN
SQUAMOUS EPITHELIAL / LPF: NONE SEEN

## 2014-11-21 LAB — POCT INR: INR: 2.1

## 2014-11-21 NOTE — Progress Notes (Signed)
Anti-Coagulation Progress Note  James [name] is a 80 y.o. male who is currently on an anti-coagulation regimen.    RECENT RESULTS: Recent results are below, the most recent result is correlated with a dose of 40 mg. per week: Lab Results  Component Value Date   INR 2.1 11/21/2014   INR 1.80 11/14/2014   INR 1.70 11/07/2014    ANTI-COAG DOSE: Anticoagulation Dose Instructions as of 11/21/2014      [name] Tue Wed Thu Fri Sat   New Dose 5 mg 7.5 mg 5 mg 5 mg 7.5 mg 5 mg 5 mg       ANTICOAG SUMMARY: Anticoagulation Episode Summary    Current INR goal 2.0-2.5  Next INR check 12/05/2014  INR from last check 2.1 (11/21/2014)  Weekly max dose   Target end date Indefinite  [facility]  Preferred lab   Send INR reminders to    Indications  Portal vein thrombosis [I81] Encounter for long-term (current) use of antiplatelets/antithrombotics [Z79.02]        Comments Target range is actually 2.0 - 2.3 (amended after initially having established when patient had GI Bleed with INR = 2.4)      Anticoagulation Care Providers    Provider Role Specialty Phone number   Annia Belt, MD  Oncology [phone]      ANTICOAG TODAY: Anticoagulation Summary as of 11/21/2014    INR goal 2.0-2.5  Selected INR 2.1 (11/21/2014)  Next INR check 12/05/2014  Target end date Indefinite   Indications  Portal vein thrombosis [I81] Encounter for long-term (current) use of antiplatelets/antithrombotics [Z79.02]      Anticoagulation Episode Summary    [facility]   Preferred lab    Send INR reminders to    Comments Target range is actually 2.0 - 2.3 (amended after initially having established when patient had GI Bleed with INR = 2.4)    Anticoagulation Care Providers    Provider Role Specialty Phone number   Annia Belt, MD  Oncology [phone]      PATIENT INSTRUCTIONS: Patient Instructions  Patient instructed to take  medications as defined in the Anti-coagulation Track section of this encounter.  Patient instructed to take today's dose.  Patient verbalized understanding of these instructions.       FOLLOW-UP Return in about 2 weeks (around 12/05/2014) for Follow up INR at 0945h.  [name], III Pharm.D., CACP

## 2014-11-21 NOTE — Telephone Encounter (Signed)
[facility]4@ 1000AM; pt informed.

## 2014-11-21 NOTE — Patient Instructions (Signed)
Patient instructed to take medications as defined in the Anti-coagulation Track section of this encounter.  Patient instructed to take today's dose.  Patient verbalized understanding of these instructions.    

## 2014-11-21 NOTE — Telephone Encounter (Signed)
Per nurse from Dr. Azucena Freed office I have scheduled appt

## 2014-11-23 ENCOUNTER — Encounter (HOSPITAL_COMMUNITY): Payer: Medicare Other

## 2014-11-25 ENCOUNTER — Encounter (HOSPITAL_COMMUNITY): Payer: Medicare Other

## 2014-11-25 ENCOUNTER — Ambulatory Visit (HOSPITAL_BASED_OUTPATIENT_CLINIC_OR_DEPARTMENT_OTHER): Payer: Medicare Other

## 2014-11-25 DIAGNOSIS — D509 Iron deficiency anemia, unspecified: Secondary | ICD-10-CM

## 2014-11-25 DIAGNOSIS — K31811 Angiodysplasia of stomach and duodenum with bleeding: Secondary | ICD-10-CM

## 2014-11-25 MED ORDER — SODIUM CHLORIDE 0.9 % IV SOLN
Freq: Once | INTRAVENOUS | Status: AC
  Administered 2014-11-25: 10:00:00 via INTRAVENOUS

## 2014-11-25 MED ORDER — SODIUM CHLORIDE 0.9 % IV SOLN
1020.0000 mg | Freq: Once | INTRAVENOUS | Status: AC
  Administered 2014-11-25: 1020 mg via INTRAVENOUS
  Filled 2014-11-25: qty 34

## 2014-11-25 NOTE — Progress Notes (Signed)
Patient declined 30 min post observation period.

## 2014-11-25 NOTE — Patient Instructions (Signed)

## 2014-11-28 ENCOUNTER — Encounter (HOSPITAL_COMMUNITY): Payer: Medicare Other

## 2014-11-29 ENCOUNTER — Ambulatory Visit (HOSPITAL_COMMUNITY)
Admission: RE | Admit: 2014-11-29 | Discharge: 2014-11-29 | Disposition: A | Discharge status: Home / Self Care | Payer: Medicare Other | Source: Ambulatory Visit | Attending: Internal Medicine | Admitting: Internal Medicine

## 2014-11-29 VITALS — BP 98/50 | HR 75 | Wt 261.5 lb

## 2014-11-29 DIAGNOSIS — E785 Hyperlipidemia, unspecified: Secondary | ICD-10-CM | POA: Insufficient documentation

## 2014-11-29 DIAGNOSIS — N400 Enlarged prostate without lower urinary tract symptoms: Secondary | ICD-10-CM | POA: Diagnosis not present

## 2014-11-29 DIAGNOSIS — Z79899 Other long term (current) drug therapy: Secondary | ICD-10-CM | POA: Diagnosis not present

## 2014-11-29 DIAGNOSIS — Z955 Presence of coronary angioplasty implant and graft: Secondary | ICD-10-CM | POA: Insufficient documentation

## 2014-11-29 DIAGNOSIS — Z86718 Personal history of other venous thrombosis and embolism: Secondary | ICD-10-CM

## 2014-11-29 DIAGNOSIS — Z7902 Long term (current) use of antithrombotics/antiplatelets: Secondary | ICD-10-CM | POA: Diagnosis not present

## 2014-11-29 DIAGNOSIS — I5032 Chronic diastolic (congestive) heart failure: Secondary | ICD-10-CM | POA: Insufficient documentation

## 2014-11-29 DIAGNOSIS — I81 Portal vein thrombosis: Secondary | ICD-10-CM | POA: Insufficient documentation

## 2014-11-29 DIAGNOSIS — Z7901 Long term (current) use of anticoagulants: Secondary | ICD-10-CM | POA: Diagnosis not present

## 2014-11-29 DIAGNOSIS — M1990 Unspecified osteoarthritis, unspecified site: Secondary | ICD-10-CM | POA: Insufficient documentation

## 2014-11-29 DIAGNOSIS — K219 Gastro-esophageal reflux disease without esophagitis: Secondary | ICD-10-CM | POA: Diagnosis not present

## 2014-11-29 DIAGNOSIS — J449 Chronic obstructive pulmonary disease, unspecified: Secondary | ICD-10-CM | POA: Diagnosis not present

## 2014-11-29 DIAGNOSIS — R319 Hematuria, unspecified: Secondary | ICD-10-CM | POA: Diagnosis not present

## 2014-11-29 DIAGNOSIS — E669 Obesity, unspecified: Secondary | ICD-10-CM | POA: Diagnosis not present

## 2014-11-29 DIAGNOSIS — G4733 Obstructive sleep apnea (adult) (pediatric): Secondary | ICD-10-CM | POA: Insufficient documentation

## 2014-11-29 DIAGNOSIS — I2510 Atherosclerotic heart disease of native coronary artery without angina pectoris: Secondary | ICD-10-CM | POA: Diagnosis not present

## 2014-11-29 DIAGNOSIS — M109 Gout, unspecified: Secondary | ICD-10-CM | POA: Insufficient documentation

## 2014-11-29 DIAGNOSIS — D509 Iron deficiency anemia, unspecified: Secondary | ICD-10-CM

## 2014-11-29 LAB — BASIC METABOLIC PANEL
Anion gap: 14 (ref 5–15)
BUN: 14 mg/dL (ref 6–23)
CO2: 21 meq/L (ref 19–32)
Calcium: 9.5 mg/dL (ref 8.4–10.5)
Chloride: 105 mEq/L (ref 96–112)
Creatinine, Ser: 0.97 mg/dL (ref 0.50–1.35)
GFR calc non Af Amer: 74 mL/min — ABNORMAL LOW (ref >90)
GFR, EST AFRICAN AMERICAN: 85 mL/min — AB (ref >90)
GLUCOSE: 154 mg/dL — AB (ref 70–99)
POTASSIUM: 4.3 meq/L (ref 3.7–5.3)
Sodium: 140 mEq/L (ref 137–147)

## 2014-11-29 LAB — CBC
HEMATOCRIT: 25.2 % — AB (ref 39.0–52.0)
HEMOGLOBIN: 7.6 g/dL — AB (ref 13.0–17.0)
MCH: 26.4 pg (ref 26.0–34.0)
MCHC: 30.2 g/dL (ref 30.0–36.0)
MCV: 87.5 fL (ref 78.0–100.0)
Platelets: 326 10*3/uL (ref 150–400)
RBC: 2.88 MIL/uL — AB (ref 4.22–5.81)
RDW: 19 % — AB (ref 11.5–15.5)
WBC: 6.6 10*3/uL (ref 4.0–10.5)

## 2014-11-29 LAB — PRO B NATRIURETIC PEPTIDE: Pro B Natriuretic peptide (BNP): 114.6 pg/mL (ref 0–450)

## 2014-11-29 NOTE — Progress Notes (Signed)
Patient ID: James [name], male   DOB: 1928-08-09, 80 y.o.   MRN: [id number] PCP: Dr. Damita Dunnings Pulmonologist: Dr. Joya Gaskins Hematologist: Dr. Beryle Beams  80 yo with history of nonobstructive CAD and chronic diastolic CHF presents for cardiology followup.   In 11/14, he had a left leg DVT.  Only trigger seems to have been that he sat for 2 days in a row in a theater taking a test for the [facility] prior to the DVT.  He was on Xarelto for 6 months.     Patient has been on Lasix 80 mg daily for several years now.  He has chronic exertional dyspnea that waxes and wanes. He had an echo in 12/14 with EF 60-65% and aortic sclerosis without significant stenosis.  He was admitted in 6/15 with dyspnea. CTA chest was negative for PE.  Lower extremity dopplers showed a superficial thrombus but no DVT. He was found to be anemic with hemoglobin 7.9.  He got 1 unit PRBCs and IV iron.  He also was diuresed with IV Lasix.  He says that he really did not feel much better.  He did not have overt GI bleeding.  Lexiscan Cardiolite was done in 6/15, showing basal to mid inferior mild ischemia (overall low risk study).   Given ongoing significant exertional symptoms, I took him for [facility] in 7/15.  RHC showed normal filling pressures and cardiac output.  LHC showed a borderline LAD stenosis.  FFR was done, suggesting that the stenosis was hemodynamically significant.  Patient had DES to LAD.  He saw Dr. Beryle Beams shortly thereafter, and it was decided that he was low risk for future DVT so Xarelto was not started.   After the PCI, patient did not note any significant change to his chronic exertional dyspnea. He was diagnosed with portal vein thrombosis in 9/15 and started on coumadin.  He then had hematochezia and was admitted to [facility] later in 9/15.  Possible small bowel AVMs.  Capsule endoscopy was negative.  Last CT abdomen in 10/15 showed small ascites and subtotal portal vein occlusion.  He did not have cirrhosis.    Since I last saw him, he was transfused 2 units in 11/15.  He continues to have suspected slow GI blood loss possibly from small bowel AVMs.  He is off ASA and is taking Plavix 75 daily + coumadin with INR goal 2-2.5.  Around Thanksgiving, he developed gross hematuria.  This has resolved but he saw urology and still had microscopic hematuria.  They want him to have a cystoscopy in 1/15. He has the same exertional dyspnea as in the past.  He is short of breath after walking about 50 feet.  No chest pain.   Labs (10/14): LDL 54, HDL 59 Labs (12/14): K 3.8, creatinine 0.9, HCT 40.3 Labs (6/15): K 3.7, creatinine 0.9, hemoglobin 7.9 => 9.4, BNP 83 Labs (7/15): K 4.2, creatinine 0.92, HCT 32.9 Labs (11/15): K 4.3, creatinine 1.3 => 1.23, LDL 69, HDL 28, hgb 9.2 => 8.6  PMH: 1. Carotid stenosis: Carotid dopplers (12/13) with 0-39% stenosis.  2. Hyperlipidemia 3. Gout 4. ITP: 5/10.  Thought to be due to Septra.  5. Obesity 6. OA 7. BPH 8. Nephrolithiasis 9. OSA 10. GERD 11. H/o melanoma 12. CAD: LHC (6/05) with EF 60%, 50% mLCx stenosis, 40% pLAD stenosis.  No intervention.  Lexiscan Cardiolite (6/15) with EF 74%, basal to mid inferior mild ischemia.   LHC (7/15) with 70% proximal LAD stenosis after D1, significant  by FFR.  Patient had DES to proximal LAD.  13. DVT (11/14): Spontaneous.  APLAS serologies negative.  6/15 [place] for DVT showed no DVT but did show a superficial thrombus right lesser saphenous vein. LE doppler (06/17/14): No evidence of lower extremity DVT or incompetence, bilaterally. Acute, partially occlusive superficial thrombus in the right lesser saphenous vein, similar to that described in the report from 05/30/14. Resolved left calf DVT. Chronic non-occlusive thrombus in the left lesser saphenous vein 14. Diastolic CHF: Echo (23/76) with EF 60-65%, aortic sclerosis without significant stenosis.  RHC (7/15) with mean RA 2, PA 22/8, mean PCWP 7, CI 2.9.  15. Symptomatic anemia 6/15,  ?GI bleeding but nothing overt.  Lower GI bleed in 9/15, possible small bowel AVMs.  Capsule endoscopy was negative.  16. Portal vein thrombosis: Diagnosed in 9/15.  No cirrhosis noted on abdominal CT.  17. COPD: See appt with Dr Joya Gaskins 7/15.  18. Hematuria  SH: Married, lives in [place], retired, 2 kids, quit smoking in 1968.   FH: h/o CVA and CAD  ROS: All systems reviewed and negative except as per HPI.   Current Outpatient Prescriptions  Medication Sig Dispense Refill  . allopurinol (ZYLOPRIM) 300 MG tablet Take 0.5 tablets (150 mg total) by mouth every morning.    . Cholecalciferol (VITAMIN D3) 2000 UNITS capsule Take 2,000 Units by mouth every morning.     . clopidogrel (PLAVIX) 75 MG tablet Take 75 mg by mouth every morning.     . furosemide (LASIX) 40 MG tablet Take 80 mg by mouth daily.     Marland Kitchen LIVALO 4 MG TABS TAKE 1 TABLET AT BEDTIME 90 tablet 3  . Misc Natural Products (OSTEO BI-FLEX JOINT SHIELD PO) Take 1 tablet by mouth every morning.     . Multiple Vitamin (MULTIVITAMIN) tablet Take 1 tablet by mouth every morning.     . nitroGLYCERIN (NITROSTAT) 0.4 MG SL tablet Place 1 tablet (0.4 mg total) under the tongue every 5 (five) minutes as needed. For chest pain 25 tablet 5  . pantoprazole (PROTONIX) 40 MG tablet Take 40 mg by mouth at bedtime.    . Probiotic Product (PROBIOTIC & ACIDOPHILUS EX ST PO) Take 1 tablet by mouth every morning.     . Psyllium (METAMUCIL PO) Take 20 mLs by mouth every evening.    . ranitidine (ZANTAC) 150 MG tablet Take 150 mg by mouth at bedtime.    Marland Kitchen spironolactone (ALDACTONE) 25 MG tablet Take 1 tablet (25 mg total) by mouth daily. (Patient taking differently: Take 25 mg by mouth daily. Take extra 25 mg for weight gain and swelling) 60 tablet 0  . telmisartan (MICARDIS) 40 MG tablet Take 40 mg by mouth every morning.     . warfarin (COUMADIN) 5 MG tablet Take 5 mg by mouth daily.      No current facility-administered medications for this  encounter.    BP 98/50 mmHg  Pulse 75  Wt 261 lb 8 oz (118.616 kg)  SpO2 99% General: NAD, obese Neck: Thick, no JVD, no thyromegaly or thyroid nodule.  Lungs: Clear to auscultation bilaterally with normal respiratory effort. CV: Nondisplaced PMI.  Heart regular S1/S2, no S3/S4, no murmur. Trace ankle edema.  No carotid bruit.  Normal pedal pulses.  Abdomen: Soft, nontender, no hepatosplenomegaly, no distention.  Skin: Intact without lesions or rashes.  Neurologic: Alert and oriented x 3.  Psych: Normal affect. Extremities: No clubbing or cyanosis.   Assessment/Plan: 1. Dyspnea: This has been  chronic. No PE on CTA chest 6/15.  He was thought to be volume overloaded at at prior appointment.  Lasix was increased but did not seem to help his symptoms much. He quit smoking years ago. LHC/RHC showed normal right and left heart filling pressures and a 70-80% proximal LAD stenosis that was hemodynamically significant by FFR.  Patient had DES to proximal LAD.  This did not help his dyspnea. Seen by pulmonary, suspect some COPD based on chest CT with emphysema and obstruction on PFTs.  However, weight and deconditioning likely play a large role.  Anemia also is involved.  - Need to to check CBC today to make sure hemoglobin is not lower. - Not significantly volume overloaded on exam.  He can continue Lasix 80 mg daily and spironolactone. Check BMET and BNP today.   - Needs to lose weight, suspect this plays a role in dyspnea.   2. Hyperlipidemia: Good LDL in 11/15, continue Livalo.  3. DVT: Occurred in 11/14.  Possibly triggered by long hours sitting in a theater seat taking a test. Serologies for APLAS were negative.  CTA chest in 6/15 was negative for PE but he had a superficial thrombus on recent lower extremity dopplers.  Repeat dopplers also showed the presence of superficial thrombus (no DVT extension).  He is now on coumadin.  4. Chronic diastolic CHF: NYHA class III symptoms, stable.  See  above discussion regarding his diuretics.  Not volume overloaded on exam today.  5. CAD: s/p DES to LAD in 7/15.  This did not help exertional dyspnea.  I would like him to go back to cardiac rehab. He will stay off aspirin given coumadin use.  He will continue coumadin with goal INR 2-2.5 along with Plavix 75 mg daily.  He will continue Plavix x 1 year ideally. 6. Portal vein thrombosis: ?cause.  Liver not cirrhotic on imaging.  He is on coumadin with goal INR 2-2.5. With unprovoked portal vein thrombosis and prior unprovoked DVT, he will need long-term coumadin.  7. GI bleeding: Lower GI bleed in 9/15.  ASA stopped, he remains on Plavix + coumadin with INR goal 2-2.5. Hemoglobin continues to trend slowly down.  Possible small bowel AVMs. CBC today.  8. Hematuria: He will need to come off Plavix and coumadin for cystoscopy.  I would like him to wait 6 months post-2nd generation DES prior to coming off Plavix if possible.  This will put the timing in the latter part of January.    Followup in 3 months.   Loralie Champagne 11/29/2014

## 2014-11-29 NOTE — Patient Instructions (Signed)
Labs today  Your physician recommends that you schedule a follow-up appointment in: 3 months  

## 2014-11-30 ENCOUNTER — Other Ambulatory Visit: Payer: Self-pay | Admitting: Oncology

## 2014-11-30 ENCOUNTER — Encounter (HOSPITAL_COMMUNITY): Payer: Medicare Other

## 2014-11-30 ENCOUNTER — Ambulatory Visit (HOSPITAL_COMMUNITY)
Admission: RE | Admit: 2014-11-30 | Discharge: 2014-11-30 | Disposition: A | Discharge status: Home / Self Care | Payer: Medicare Other | Source: Ambulatory Visit | Attending: Oncology | Admitting: Oncology

## 2014-11-30 ENCOUNTER — Ambulatory Visit: Payer: Medicare Other

## 2014-11-30 ENCOUNTER — Ambulatory Visit (HOSPITAL_BASED_OUTPATIENT_CLINIC_OR_DEPARTMENT_OTHER): Payer: Medicare Other

## 2014-11-30 VITALS — BP 113/44 | HR 61 | Temp 97.8°F | Resp 18

## 2014-11-30 DIAGNOSIS — D500 Iron deficiency anemia secondary to blood loss (chronic): Secondary | ICD-10-CM

## 2014-11-30 LAB — PREPARE RBC (CROSSMATCH)

## 2014-11-30 LAB — HOLD TUBE, BLOOD BANK

## 2014-11-30 MED ORDER — SODIUM CHLORIDE 0.9 % IV SOLN
250.0000 mL | Freq: Once | INTRAVENOUS | Status: AC
  Administered 2014-11-30: 250 mL via INTRAVENOUS

## 2014-11-30 MED ORDER — HEPARIN SOD (PORK) LOCK FLUSH 100 UNIT/ML IV SOLN
500.0000 [IU] | Freq: Every day | INTRAVENOUS | Status: DC | PRN
  Filled 2014-11-30: qty 5

## 2014-11-30 MED ORDER — SODIUM CHLORIDE 0.9 % IJ SOLN
10.0000 mL | INTRAMUSCULAR | Status: DC | PRN
  Filled 2014-11-30: qty 10

## 2014-11-30 MED ORDER — FUROSEMIDE 10 MG/ML IJ SOLN
20.0000 mg | Freq: Once | INTRAMUSCULAR | Status: AC
  Administered 2014-11-30: 20 mg via INTRAVENOUS

## 2014-11-30 MED ORDER — HEPARIN SOD (PORK) LOCK FLUSH 100 UNIT/ML IV SOLN
250.0000 [IU] | INTRAVENOUS | Status: AC | PRN
  Filled 2014-11-30: qty 5

## 2014-11-30 NOTE — Progress Notes (Signed)
Left arm IV SL'd for tomorrow morning blood infusion. IV clean/dry/intact, flushed prior to discharge.

## 2014-11-30 NOTE — Patient Instructions (Signed)

## 2014-12-01 ENCOUNTER — Ambulatory Visit (HOSPITAL_BASED_OUTPATIENT_CLINIC_OR_DEPARTMENT_OTHER): Payer: Medicare Other

## 2014-12-01 ENCOUNTER — Encounter (HOSPITAL_COMMUNITY): Payer: Self-pay | Admitting: Cardiology

## 2014-12-01 VITALS — BP 107/81 | HR 59 | Temp 97.7°F | Resp 16

## 2014-12-01 DIAGNOSIS — D500 Iron deficiency anemia secondary to blood loss (chronic): Secondary | ICD-10-CM

## 2014-12-01 MED ORDER — FUROSEMIDE 10 MG/ML IJ SOLN
20.0000 mg | Freq: Once | INTRAMUSCULAR | Status: AC
  Administered 2014-12-01: 20 mg via INTRAVENOUS

## 2014-12-01 NOTE — Patient Instructions (Signed)

## 2014-12-02 ENCOUNTER — Encounter (HOSPITAL_COMMUNITY): Admission: RE | Admit: 2014-12-02 | Payer: Medicare Other | Source: Ambulatory Visit

## 2014-12-02 LAB — TYPE AND SCREEN
ABO/RH(D): A NEG
ANTIBODY SCREEN: NEGATIVE
Unit division: 0
Unit division: 0

## 2014-12-05 ENCOUNTER — Encounter (HOSPITAL_COMMUNITY): Payer: Medicare Other

## 2014-12-05 ENCOUNTER — Other Ambulatory Visit (INDEPENDENT_AMBULATORY_CARE_PROVIDER_SITE_OTHER): Payer: Medicare Other

## 2014-12-05 ENCOUNTER — Ambulatory Visit (INDEPENDENT_AMBULATORY_CARE_PROVIDER_SITE_OTHER): Payer: Medicare Other | Admitting: Pharmacist

## 2014-12-05 DIAGNOSIS — K922 Gastrointestinal hemorrhage, unspecified: Secondary | ICD-10-CM

## 2014-12-05 DIAGNOSIS — Z7901 Long term (current) use of anticoagulants: Secondary | ICD-10-CM

## 2014-12-05 DIAGNOSIS — Z7902 Long term (current) use of antithrombotics/antiplatelets: Secondary | ICD-10-CM

## 2014-12-05 DIAGNOSIS — I81 Portal vein thrombosis: Secondary | ICD-10-CM

## 2014-12-05 LAB — CBC WITH DIFFERENTIAL/PLATELET
BASOS ABS: 0 10*3/uL (ref 0.0–0.1)
BASOS PCT: 0 % (ref 0–1)
EOS ABS: 0.2 10*3/uL (ref 0.0–0.7)
Eosinophils Relative: 3 % (ref 0–5)
HCT: 32.1 % — ABNORMAL LOW (ref 39.0–52.0)
HEMOGLOBIN: 10.2 g/dL — AB (ref 13.0–17.0)
Lymphocytes Relative: 20 % (ref 12–46)
Lymphs Abs: 1.3 10*3/uL (ref 0.7–4.0)
MCH: 28.6 pg (ref 26.0–34.0)
MCHC: 31.8 g/dL (ref 30.0–36.0)
MCV: 89.9 fL (ref 78.0–100.0)
MONOS PCT: 12 % (ref 3–12)
Monocytes Absolute: 0.8 10*3/uL (ref 0.1–1.0)
NEUTROS ABS: 4.2 10*3/uL (ref 1.7–7.7)
NEUTROS PCT: 65 % (ref 43–77)
PLATELETS: 218 10*3/uL (ref 150–400)
RBC: 3.57 MIL/uL — ABNORMAL LOW (ref 4.22–5.81)
RDW: 22.3 % — AB (ref 11.5–15.5)
WBC: 6.4 10*3/uL (ref 4.0–10.5)

## 2014-12-05 LAB — POCT INR: INR: 2.3

## 2014-12-05 NOTE — Progress Notes (Signed)
Anti-Coagulation Progress Note  James [name] is a 80 y.o. male who is currently on an anti-coagulation regimen.    RECENT RESULTS: Recent results are below, the most recent result is correlated with a dose of 40 mg. per week: Lab Results  Component Value Date   INR 2.30 12/05/2014   INR 2.1 11/21/2014   INR 1.80 11/14/2014    ANTI-COAG DOSE: Anticoagulation Dose Instructions as of 12/05/2014      [name] Tue Wed Thu Fri Sat   New Dose 5 mg 7.5 mg 5 mg 5 mg 7.5 mg 5 mg 5 mg       ANTICOAG SUMMARY: Anticoagulation Episode Summary    Current INR goal 2.0-2.5  Next INR check 12/26/2014  INR from last check 2.30 (12/05/2014)  Weekly max dose   Target end date Indefinite  [facility] check [facility]  Preferred lab   Send INR reminders to    Indications  Portal vein thrombosis [I81] Encounter for long-term (current) use of antiplatelets/antithrombotics [Z79.02]        Comments Target range is actually 2.0 - 2.3 (amended after initially having established when patient had GI Bleed with INR = 2.4)      Anticoagulation Care Providers    Provider Role Specialty Phone number   Annia Belt, MD  Oncology [phone]      ANTICOAG TODAY: Anticoagulation Summary as of 12/05/2014    INR goal 2.0-2.5  Selected INR 2.30 (12/05/2014)  Next INR check 12/26/2014  Target end date Indefinite   Indications  Portal vein thrombosis [I81] Encounter for long-term (current) use of antiplatelets/antithrombotics [Z79.02]      Anticoagulation Episode Summary    INR check location [facility]   Preferred lab    Send INR reminders to    Comments Target range is actually 2.0 - 2.3 (amended after initially having established when patient had GI Bleed with INR = 2.4)    Anticoagulation Care Providers    Provider Role Specialty Phone number   Annia Belt, MD  Oncology [phone]      PATIENT INSTRUCTIONS: Patient Instructions  Patient instructed to take  medications as defined in the Anti-coagulation Track section of this encounter.  Patient instructed to take today's dose.  Patient verbalized understanding of these instructions.       FOLLOW-UP Return in 3 weeks (on 12/26/2014) for Follow up INR at 0930h.  [name], III Pharm.D., CACP

## 2014-12-05 NOTE — Patient Instructions (Signed)
Patient instructed to take medications as defined in the Anti-coagulation Track section of this encounter.  Patient instructed to take today's dose.  Patient verbalized understanding of these instructions.    

## 2014-12-06 ENCOUNTER — Other Ambulatory Visit: Payer: Medicare Other

## 2014-12-06 ENCOUNTER — Encounter: Payer: Self-pay | Admitting: Adult Health

## 2014-12-06 ENCOUNTER — Ambulatory Visit (INDEPENDENT_AMBULATORY_CARE_PROVIDER_SITE_OTHER): Payer: Medicare Other | Admitting: Adult Health

## 2014-12-06 VITALS — BP 85/46 | HR 65 | Temp 95.0°F | Ht 71.5 in | Wt 254.0 lb

## 2014-12-06 DIAGNOSIS — I2510 Atherosclerotic heart disease of native coronary artery without angina pectoris: Secondary | ICD-10-CM

## 2014-12-06 DIAGNOSIS — R269 Unspecified abnormalities of gait and mobility: Secondary | ICD-10-CM

## 2014-12-06 DIAGNOSIS — H8110 Benign paroxysmal vertigo, unspecified ear: Secondary | ICD-10-CM

## 2014-12-06 DIAGNOSIS — G609 Hereditary and idiopathic neuropathy, unspecified: Secondary | ICD-10-CM

## 2014-12-06 DIAGNOSIS — Z9861 Coronary angioplasty status: Secondary | ICD-10-CM

## 2014-12-06 NOTE — Patient Instructions (Signed)
Overall your peripheral neuropathy and vertigo has remained stable.   Peripheral Neuropathy Peripheral neuropathy is a type of nerve damage. It affects nerves that carry signals between the spinal cord and other parts of the body. These are called peripheral nerves. With peripheral neuropathy, one nerve or a group of nerves may be damaged.  CAUSES  Many things can damage peripheral nerves. For some people with peripheral neuropathy, the cause is unknown. Some causes include:  Diabetes. This is the most common cause of peripheral neuropathy.  Injury to a nerve.  Pressure or stress on a nerve that lasts a long time.  Too little vitamin B. Alcoholism can lead to this.  Infections.  Autoimmune diseases, such as multiple sclerosis and systemic lupus erythematosus.  Inherited nerve diseases.  Some medicines, such as cancer drugs.  Toxic substances, such as lead and mercury.  Too little blood flowing to the legs.  Kidney disease.  Thyroid disease. SIGNS AND SYMPTOMS  Different people have different symptoms. The symptoms you have will depend on which of your nerves is damaged. Common symptoms include:  Loss of feeling (numbness) in the feet and hands.  Tingling in the feet and hands.  Pain that burns.  Very sensitive skin.  Weakness.  Not being able to move a part of the body (paralysis).  Muscle twitching.  Clumsiness or poor coordination.  Loss of balance.  Not being able to control your bladder.  Feeling dizzy.  Sexual problems. DIAGNOSIS  Peripheral neuropathy is a symptom, not a disease. Finding the cause of peripheral neuropathy can be hard. To figure that out, your health care provider will take a medical history and do a physical exam. A neurological exam will also be done. This involves checking things affected by your brain, spinal cord, and nerves (nervous system). For example, your health care provider will check your reflexes, how you move, and what you  can feel.  Other types of tests may also be ordered, such as:  Blood tests.  A test of the fluid in your spinal cord.  Imaging tests, such as CT scans or an MRI.  Electromyography (EMG). This test checks the nerves that control muscles.  Nerve conduction velocity tests. These tests check how fast messages pass through your nerves.  Nerve biopsy. A small piece of nerve is removed. It is then checked under a microscope. TREATMENT   Medicine is often used to treat peripheral neuropathy. Medicines may include:  Pain-relieving medicines. Prescription or over-the-counter medicine may be suggested.  Antiseizure medicine. This may be used for pain.  Antidepressants. These also may help ease pain from neuropathy.  Lidocaine. This is a numbing medicine. You might wear a patch or be given a shot.  Mexiletine. This medicine is typically used to help control irregular heart rhythms.  Surgery. Surgery may be needed to relieve pressure on a nerve or to destroy a nerve that is causing pain.  Physical therapy to help movement.  Assistive devices to help movement. HOME CARE INSTRUCTIONS   Only take over-the-counter or prescription medicines as directed by your health care provider. Follow the instructions carefully for any given medicines. Do not take any other medicines without first getting approval from your health care provider.  If you have diabetes, work closely with your health care provider to keep your blood sugar under control.  If you have numbness in your feet:  Check every day for signs of injury or infection. Watch for redness, warmth, and swelling.  Wear padded socks and  comfortable shoes. These help protect your feet.  Do not do things that put pressure on your damaged nerve.  Do not smoke. Smoking keeps blood from getting to damaged nerves.  Avoid or limit alcohol. Too much alcohol can cause a lack of B vitamins. These vitamins are needed for healthy nerves.  Develop  a good support system. Coping with peripheral neuropathy can be stressful. Talk to a mental health specialist or join a support group if you are struggling.  Follow up with your health care provider as directed. SEEK MEDICAL CARE IF:   You have new signs or symptoms of peripheral neuropathy.  You are struggling emotionally from dealing with peripheral neuropathy.  You have a fever. SEEK IMMEDIATE MEDICAL CARE IF:   You have an injury or infection that is not healing.  You feel very dizzy or begin vomiting.  You have chest pain.  You have trouble breathing. Document Released: 11/29/2002 Document Revised: 08/21/2011 Document Reviewed: 08/16/2013 [facility] Patient Information 2015 [place], [place]. This information is not intended to replace advice given to you by your health care provider. Make sure you discuss any questions you have with your health care provider.

## 2014-12-06 NOTE — Progress Notes (Signed)
PATIENT: James [name] DOB: [birth year]  REASON FOR VISIT: follow up HISTORY FROM: patient  HISTORY OF PRESENT ILLNESS: James [name] is an 80 year old male with a history of peripheral neuropathy and benign positional vertigo. The patient returns today for follow-up. The patient denies any discomfort with his peripheral neuropathy. He denies any changes with his gait except fatigue. Denies any recent falls. He will use a cane when needed. Patient denies any episodes of vertigo. No new medical issues since last seen. Patient has been hospitalized three times for bleeding and blood clots that are in the liver and legs. He has had blood transfusions as well as Iron infusions.   HISTORY 09/20/13 (James [name]): 80 year old left-handed white male with a history of a peripheral neuropathy and episodes of benign positional vertigo. The patient has done well since last seen. The patient has not had any significant issues with vertigo, and he indicates that his gait disorder remains stable. The patient has not had any falls, but he will occasionally use a cane when he is walking on uneven ground. The patient has had some issues with plantar fasciitis, but this has been improved with the use of magnets. The patient denies any other new medical issues that have come up since last seen. The patient is not taking meclizine at this point.  REVIEW OF SYSTEMS: Out of a complete 14 system review of symptoms, the patient complains only of the following symptoms, and all other reviewed systems are negative.  Shortness of breath Itching  ALLERGIES: Allergies  Allergen Reactions  . Aleve [Naproxen Sodium] Other (See Comments)    Causes platlet drop  . Rofecoxib Other (See Comments)    Pt doesn't remember  . Sulfa Antibiotics Other (See Comments)    Pt doesn't remember  . Sulfamethoxazole-Trimethoprim Other (See Comments)     Causes low platelets and bleeding  . Tramadol Hcl Other (See Comments)    dizziness,  drowsiness  . Neomycin-Bacitracin Zn-Polymyx Rash  . Neosporin [Neomycin-Polymyxin-Gramicidin] Rash  . Penicillins Swelling and Rash    HOME MEDICATIONS: Outpatient Prescriptions Prior to Visit  Medication Sig Dispense Refill  . allopurinol (ZYLOPRIM) 300 MG tablet Take 0.5 tablets (150 mg total) by mouth every morning.    . Cholecalciferol (VITAMIN D3) 2000 UNITS capsule Take 2,000 Units by mouth every morning.     . clopidogrel (PLAVIX) 75 MG tablet Take 75 mg by mouth every morning.     . furosemide (LASIX) 40 MG tablet Take 80 mg by mouth daily.     Marland Kitchen LIVALO 4 MG TABS TAKE 1 TABLET AT BEDTIME 90 tablet 3  . Misc Natural Products (OSTEO BI-FLEX JOINT SHIELD PO) Take 1 tablet by mouth every morning.     . Multiple Vitamin (MULTIVITAMIN) tablet Take 1 tablet by mouth every morning.     . nitroGLYCERIN (NITROSTAT) 0.4 MG SL tablet Place 1 tablet (0.4 mg total) under the tongue every 5 (five) minutes as needed. For chest pain 25 tablet 5  . pantoprazole (PROTONIX) 40 MG tablet Take 40 mg by mouth at bedtime.    . Probiotic Product (PROBIOTIC & ACIDOPHILUS EX ST PO) Take 1 tablet by mouth every morning.     . Psyllium (METAMUCIL PO) Take 20 mLs by mouth every evening.    . ranitidine (ZANTAC) 150 MG tablet Take 150 mg by mouth at bedtime.    Marland Kitchen spironolactone (ALDACTONE) 25 MG tablet Take 1 tablet (25 mg total) by mouth daily. (Patient taking differently: Take 25  mg by mouth daily. Take extra 25 mg for weight gain and swelling) 60 tablet 0  . telmisartan (MICARDIS) 40 MG tablet Take 40 mg by mouth every morning.     . warfarin (COUMADIN) 5 MG tablet Take 5 mg by mouth daily.      No facility-administered medications prior to visit.    PAST MEDICAL HISTORY: Past Medical History  Diagnosis Date  . Bradycardia     Hypertensive  . carotid bruit, right   . hypercholesterolemia     Trig 234/293;takes Simcor daily  . Obesity, morbid   . Vasculitis   . Immune thrombocytopenic purpura  5/20-5/27/2010; 2011    Hosp ITP severe, Dr. Beryle Beams; Dr. Beryle Beams , normal platelets  . Dermatitis     legs  . Bleeding gums   . Chronic low back pain   . Myalgia and myositis   . Unspecified vitamin D deficiency   . Bladder diverticulum   . Primary osteoarthritis of both knees   . Nephrolithiasis   . History of elevated glucose   . Thrombophlebitis     right forearm  . History of BPH   . History of colonic polyps     [name]  . Epididymitis, left     S/P Epidimectomy  . Splenomegaly 05/15/09    abd U/S NML  . Hypertension, benign     takes Micardis daily  . Neuropathy     acute  . Bruises easily   . H/O hiatal hernia   . GERD (gastroesophageal reflux disease)     hx of but doesn't take any meds now  . Urinary frequency   . Urinary urgency   . Nocturia   . Urinary leakage   . Abnormality of gait 03/19/2013  . DVT of lower limb, acute 11/19/2013    Peroneal & distal tibial left 11/01/13  . Polio 1941    "mild case"  . CHF (congestive heart failure)     "low grade" (06/22/2014)  . DVT (deep venous thrombosis) 10/2013    LLE  . Pneumonia, bacterial     RML resolved, spirometry, normal  . Pneumonia 1933  . History of blood transfusion 05/2014  . Anemia 05/2014    "related to Xarelto"  . Iron deficiency anemia 05/2014    "got iron infusion"  . Arthritis     "all over"  . Gout   . Malignant melanoma of ear     right  . Thrombophlebitis of superficial veins of right lower extremity 06/23/2014    Incidental finding doppler 05/30/14  . Shortness of breath   . Esophageal varices without mention of bleeding 08/08/2014  . Portal vein thrombosis     PAST SURGICAL HISTORY: Past Surgical History  Procedure Laterality Date  . Cystoscopy w/ stone manipulation  1950's  . Fem cutaneous nerve entrapment  1977    due to surgery (mild lat anesth of bilat legs)  . Limited pelvis/hip bone scan  04/99    negative  . Bone scan  04/00  . Cataract extraction w/ intraocular  lens  implant, bilateral Bilateral ~ 2004  . Melanoma excision Right 06/04    ear; Wide local excision,,with flap  . Stress cardiolite  05/11/04    mild inf. ischemia, EF 71%  . Shoulder surgery Left 06/17/2005    "tore it all to pieces when I fell; not fractured"  . Bronchoscopy  09/08    RML collapse with chronic pneumonia, bx neg (Dr. Joya Gaskins)  . Cyst excision Right  03/07/09    middle finger, DIP Joint Mucoid (Dr. Fredna Dow)  . Eyelid & eyebrow lift  1996  . Cyst excision Right 08/15/09    middle finger, DIP Joint Mucoid Cyst (Dr. Fredna Dow)  . Appendectomy  1950  . Excision of mucoid tumor    . Colonoscopy    . Total knee arthroplasty  01/10/2012    Procedure: TOTAL KNEE ARTHROPLASTY;  Surgeon: Augustin Schooling, MD;  Location: [place];  Service: Orthopedics;  Laterality: Left;  . Surgery scrotal / testicular Left ~ 2000    removal of mass, noncancerous  . Cardiac catheterization  05/23/04    mild plague-statin  . Coronary angioplasty with stent placement  06/22/2014    to proximal LAD  . Inguinal hernia repair Bilateral 1959  . Inguinal hernia repair Left 1970's  . Esophagogastroduodenoscopy N/A 08/08/2014    Procedure: ESOPHAGOGASTRODUODENOSCOPY (EGD);  Surgeon: Gatha Mayer, MD;  Location: [facility] ENDOSCOPY;  Service: Endoscopy;  Laterality: N/A;  . Colonoscopy N/A 08/08/2014    Procedure: COLONOSCOPY;  Surgeon: Gatha Mayer, MD;  Location: [place];  Service: Endoscopy;  Laterality: N/A;  . Flexible sigmoidoscopy N/A 09/21/2014    Procedure: FLEXIBLE SIGMOIDOSCOPY;  Surgeon: Jerene Bears, MD;  Location: [place];  Service: Gastroenterology;  Laterality: N/A;  . Givens capsule study N/A 09/21/2014    Procedure: GIVENS CAPSULE STUDY;  Surgeon: Jerene Bears, MD;  Location: [facility] ENDOSCOPY;  Service: Endoscopy;  Laterality: N/A;  . Left and right heart catheterization with coronary angiogram N/A 06/22/2014    Procedure: LEFT AND RIGHT HEART CATHETERIZATION WITH CORONARY ANGIOGRAM;  Surgeon:  Larey Dresser, MD;  Location: [facility] CATH LAB;  Service: Cardiovascular;  Laterality: N/A;  . Percutaneous coronary stent intervention (pci-s)  06/22/2014    Procedure: PERCUTANEOUS CORONARY STENT INTERVENTION (PCI-S);  Surgeon: Larey Dresser, MD;  Location: [facility] CATH LAB;  Service: Cardiovascular;;    FAMILY HISTORY: Family History  Problem Relation Age of Onset  . Thyroid cancer Mother   . Diabetes Father   . Heart disease Father   . Stroke Brother     cerebral hemm  . Colon cancer Brother   . Anesthesia problems Neg Hx   . Hypotension Neg Hx   . Malignant hyperthermia Neg Hx   . Pseudochol deficiency Neg Hx   . Prostate cancer Neg Hx   . Heart attack Father     SOCIAL HISTORY: History   Social History  . Marital Status: Widowed    Spouse Name: N/A    Number of Children: 2  . Years of Education: college   Occupational History  . Retired     1996 [employer]   Social History Main Topics  . Smoking status: Former Smoker -- 2.00 packs/day for 20 years    Types: Cigarettes, Cigars    Quit date: 09/27/1967  . Smokeless tobacco: Never Used  . Alcohol Use: No  . Drug Use: No  . Sexual Activity: Not on file   Other Topics Concern  . Not on file   Social History Narrative   Widowed 2013 after 63+ years of marriage, Wife had CHF and multiple myeloma   James [name], daughter in Sports coach, lives with patient   Patient does not get regular exercise   No caffeine   Retired from [employer] (E[employer]), retired from [employer]:   12/06/14 1320  Height: 5' 11.5" (1.816 m)  Weight: 254 lb (115.214 kg)   Body mass index is 34.94 kg/(m^2).  Generalized: Well developed, in no acute distress   Neurological examination  Mentation: Alert oriented to time, place, history taking. Follows all commands speech and language fluent Cranial nerve II-XII: Pupils were equal round reactive to light. Extraocular movements were full, visual field  were full on confrontational test. Facial sensation and strength were normal. Uvula tongue midline. Head turning and shoulder shrug  were normal and symmetric. Motor: The motor testing reveals 5 over 5 strength of all 4 extremities. Good symmetric motor tone is noted throughout.  Sensory: Sensory testing is intact to soft touch on all 4 extremities. No evidence of extinction is noted.  Coordination: Cerebellar testing reveals good finger-nose-finger and heel-to-shin bilaterally.  Gait and station: Gait is slightly wide based. Patient has shortness of breath with minimal activity. Tandem gait not attempted. Romberg is negative but unsteady. No drift is seen.  Reflexes: Deep tendon reflexes are symmetric and normal bilaterally.   DIAGNOSTIC DATA (LABS, IMAGING, TESTING) - I reviewed patient records, labs, notes, testing and imaging myself where available.  Lab Results  Component Value Date   WBC 6.4 12/05/2014   HGB 10.2* 12/05/2014   HCT 32.1* 12/05/2014   MCV 89.9 12/05/2014   PLT 218 12/05/2014      Component Value Date/Time   NA 140 11/29/2014 0944   NA 141 11/24/2013 0813   K 4.3 11/29/2014 0944   K 3.8 11/24/2013 0813   CL 105 11/29/2014 0944   CO2 21 11/29/2014 0944   CO2 23 11/24/2013 0813   GLUCOSE 154* 11/29/2014 0944   GLUCOSE 163* 11/24/2013 0813   BUN 14 11/29/2014 0944   BUN 12.3 11/24/2013 0813   CREATININE 0.97 11/29/2014 0944   CREATININE 1.23 11/07/2014 0921   CREATININE 0.9 11/24/2013 0813   CALCIUM 9.5 11/29/2014 0944   CALCIUM 9.5 11/24/2013 0813   PROT 6.1 11/07/2014 0921   PROT 7.0 11/24/2013 0813   ALBUMIN 3.6 11/07/2014 0921   ALBUMIN 3.6 11/24/2013 0813   AST 21 11/07/2014 0921   AST 25 11/24/2013 0813   ALT 30 11/07/2014 0921   ALT 26 11/24/2013 0813   ALKPHOS 100 11/07/2014 0921   ALKPHOS 87 11/24/2013 0813   BILITOT 0.6 11/07/2014 0921   BILITOT 0.57 11/24/2013 0813   GFRNONAA 74* 11/29/2014 0944   GFRNONAA 63 10/17/2014 0931   GFRAA 85*  11/29/2014 0944   GFRAA 73 10/17/2014 0931   Lab Results  Component Value Date   CHOL 144 10/24/2014   HDL 28.50* 10/24/2014   LDLCALC 54 10/22/2013   LDLDIRECT 68.6 10/24/2014   TRIG 206.0* 10/24/2014   CHOLHDL 5 10/24/2014   Lab Results  Component Value Date   HGBA1C 6.4 05/05/2014   No results found for: [id number] Lab Results  Component Value Date   TSH 5.050* 05/29/2014      ASSESSMENT AND PLAN 80 y.o. year old male  has a past medical history of Bradycardia; carotid bruit, right; hypercholesterolemia; Obesity, morbid; Vasculitis; Immune thrombocytopenic purpura (5/20-5/27/2010; 2011); Dermatitis; Bleeding gums; Chronic low back pain; Myalgia and myositis; Unspecified vitamin D deficiency; Bladder diverticulum; Primary osteoarthritis of both knees; Nephrolithiasis; History of elevated glucose; Thrombophlebitis; History of BPH; History of colonic polyps; Epididymitis, left; Splenomegaly (05/15/09); Hypertension, benign; Neuropathy; Bruises easily; H/O hiatal hernia; GERD (gastroesophageal reflux disease); Urinary frequency; Urinary urgency; Nocturia; Urinary leakage; Abnormality of gait (03/19/2013); DVT of lower limb, acute (11/19/2013); Polio (1941); CHF (congestive heart failure); DVT (  deep venous thrombosis) (10/2013); Pneumonia, bacterial; Pneumonia (1933); History of blood transfusion (05/2014); Anemia (05/2014); Iron deficiency anemia (05/2014); Arthritis; Gout; Malignant melanoma of ear; Thrombophlebitis of superficial veins of right lower extremity (06/23/2014); Shortness of breath; Esophageal varices without mention of bleeding (08/08/2014); and Portal vein thrombosis. here with:  1. Peripheral neuropathy 2. Benign positional vertigo   Overall the patient has remained stable. The patient does not have any discomfort with his peripheral neuropathy. His gait has remained the same. He denies any episodes of vertigo. The patient will let us know if his symptoms worsen or he develops  new symptoms. Otherwise he will follow-up in one year.   Ward Givens, MSN, NP-C 12/06/2014, 1:27 PM [facility] [address] [place], [place] [address] [phone]  Note: This document was prepared with digital dictation and possible smart phrase technology. Any transcriptional errors that result from this process are unintentional.

## 2014-12-06 NOTE — Progress Notes (Signed)
I have read the note, and I agree with the clinical assessment and plan.  WILLIS,CHARLES KEITH   

## 2014-12-07 ENCOUNTER — Telehealth: Payer: Self-pay | Admitting: *Deleted

## 2014-12-07 ENCOUNTER — Encounter (HOSPITAL_COMMUNITY): Payer: Medicare Other

## 2014-12-07 NOTE — Progress Notes (Signed)
INR at goal.  I have reviewed Dr. Gladstone Pih note.

## 2014-12-07 NOTE — Telephone Encounter (Signed)
-----   Message from Annia Belt, MD sent at 12/05/2014  5:49 PM EST ----- Call pt: Hb up to 10.2 following recent transfusion.  Did he ever complete his stool cards for guaic testing?

## 2014-12-07 NOTE — Telephone Encounter (Signed)
Pt called/informed Hgb up to 10.2 after his transfusion. Pt stated he mailed stool cards yesterday.

## 2014-12-08 ENCOUNTER — Other Ambulatory Visit (INDEPENDENT_AMBULATORY_CARE_PROVIDER_SITE_OTHER): Payer: Medicare Other

## 2014-12-08 ENCOUNTER — Other Ambulatory Visit: Payer: Self-pay | Admitting: Oncology

## 2014-12-08 DIAGNOSIS — D509 Iron deficiency anemia, unspecified: Secondary | ICD-10-CM

## 2014-12-08 DIAGNOSIS — K625 Hemorrhage of anus and rectum: Secondary | ICD-10-CM

## 2014-12-08 DIAGNOSIS — K922 Gastrointestinal hemorrhage, unspecified: Secondary | ICD-10-CM

## 2014-12-08 LAB — POC HEMOCCULT BLD/STL (HOME/3-CARD/SCREEN)
Card #3 Fecal Occult Blood, POC: POSITIVE
FECAL OCCULT BLD: POSITIVE
Fecal Occult Blood, POC: POSITIVE

## 2014-12-08 NOTE — Progress Notes (Signed)
Quick Note:  We certainly do miss AVM's on capsule endoscopy - so can repeat that. Doubt Meckel's given frequency and we did not see that on the capsule. Can repeat a capsule endoscopy - worth a shot but lesions may be out of reach of scopes He has portal hypertension also so easier to bleed diffusely in UGI tract and on clopidogrel and warfarin makes that even easier.   ______

## 2014-12-09 ENCOUNTER — Other Ambulatory Visit (INDEPENDENT_AMBULATORY_CARE_PROVIDER_SITE_OTHER): Payer: Medicare Other

## 2014-12-09 ENCOUNTER — Encounter (HOSPITAL_COMMUNITY): Payer: Medicare Other

## 2014-12-09 ENCOUNTER — Telehealth: Payer: Self-pay | Admitting: *Deleted

## 2014-12-09 ENCOUNTER — Encounter: Payer: Medicare Other | Attending: Cardiology | Admitting: Dietician

## 2014-12-09 ENCOUNTER — Encounter: Payer: Self-pay | Admitting: Dietician

## 2014-12-09 ENCOUNTER — Other Ambulatory Visit (HOSPITAL_COMMUNITY): Payer: Self-pay | Admitting: *Deleted

## 2014-12-09 VITALS — Ht 71.0 in | Wt 251.6 lb

## 2014-12-09 DIAGNOSIS — Z862 Personal history of diseases of the blood and blood-forming organs and certain disorders involving the immune mechanism: Secondary | ICD-10-CM | POA: Diagnosis not present

## 2014-12-09 DIAGNOSIS — Z79899 Other long term (current) drug therapy: Secondary | ICD-10-CM | POA: Diagnosis not present

## 2014-12-09 DIAGNOSIS — K2970 Gastritis, unspecified, without bleeding: Secondary | ICD-10-CM

## 2014-12-09 DIAGNOSIS — K766 Portal hypertension: Secondary | ICD-10-CM | POA: Diagnosis not present

## 2014-12-09 DIAGNOSIS — K922 Gastrointestinal hemorrhage, unspecified: Secondary | ICD-10-CM

## 2014-12-09 DIAGNOSIS — D509 Iron deficiency anemia, unspecified: Secondary | ICD-10-CM

## 2014-12-09 DIAGNOSIS — Z7901 Long term (current) use of anticoagulants: Secondary | ICD-10-CM | POA: Insufficient documentation

## 2014-12-09 DIAGNOSIS — I6322 Cerebral infarction due to unspecified occlusion or stenosis of basilar arteries: Secondary | ICD-10-CM

## 2014-12-09 DIAGNOSIS — I2510 Atherosclerotic heart disease of native coronary artery without angina pectoris: Secondary | ICD-10-CM | POA: Insufficient documentation

## 2014-12-09 DIAGNOSIS — Z713 Dietary counseling and surveillance: Secondary | ICD-10-CM | POA: Diagnosis not present

## 2014-12-09 DIAGNOSIS — R634 Abnormal weight loss: Secondary | ICD-10-CM | POA: Insufficient documentation

## 2014-12-09 LAB — CBC WITH DIFFERENTIAL/PLATELET
Basophils Absolute: 0 10*3/uL (ref 0.0–0.1)
Basophils Relative: 0 % (ref 0–1)
Eosinophils Absolute: 0.1 10*3/uL (ref 0.0–0.7)
Eosinophils Relative: 2 % (ref 0–5)
HCT: 31.6 % — ABNORMAL LOW (ref 39.0–52.0)
HEMOGLOBIN: 10 g/dL — AB (ref 13.0–17.0)
LYMPHS PCT: 19 % (ref 12–46)
Lymphs Abs: 1.4 10*3/uL (ref 0.7–4.0)
MCH: 28.6 pg (ref 26.0–34.0)
MCHC: 31.6 g/dL (ref 30.0–36.0)
MCV: 90.3 fL (ref 78.0–100.0)
MONOS PCT: 12 % (ref 3–12)
Monocytes Absolute: 0.9 10*3/uL (ref 0.1–1.0)
Neutro Abs: 4.8 10*3/uL (ref 1.7–7.7)
Neutrophils Relative %: 67 % (ref 43–77)
PLATELETS: 226 10*3/uL (ref 150–400)
RBC: 3.5 MIL/uL — AB (ref 4.22–5.81)
RDW: 22.1 % — ABNORMAL HIGH (ref 11.5–15.5)
WBC: 7.2 10*3/uL (ref 4.0–10.5)

## 2014-12-09 NOTE — Progress Notes (Signed)
  Medical Nutrition Therapy:  Appt start time: 0800 end time:  0900.   Assessment:  Primary concerns today: hypertension and unintended weight loss. Patient has hx of CHF, CAD, GI bleeding.  He is most concerned about what he should and shouldn't eat to follow a low sodium diet.  He is eating small meals three time a day but often does not feel hungry and gets full easily.   He is a pleasant man, active in his community, and was engaged during our appointment.  He reports that he reads nutrition labels for sodium content and is purchasing low-sodium and no salt added foods.  He states that he limits his red meat consumption and chooses whole grains high in fiber and tries to eat one fruit a day.  Patient has high level of support from his daughter with his dietary goals.  Learning Readiness:   Contemplating  Ready  MEDICATIONS: see list, coumadin (recently cleared by MD to begin eating dark leafy greens again)   DIETARY INTAKE:  Usual eating pattern includes 3 meals and 0-1 snacks per day.  24-hr recall:  B (8:30-9 AM): small sweet potato and a banana OR whole grain wheat cereal like Kashi Snk ( AM): apple, orange, or grapes  L (12-1:30 PM): sometimes [place] sandwich, homemade soup Snk ( PM): occasionally small amount of cheese and crackers D ( PM): daughter brings steak or salmon with sides, sometimes grills chicken or salmon (will make 2-3 meals out of this-eats small portions) Snk ( PM): not usually Beverages: skim milk, water, no sodas, 8 oz. decaf coffee, occasionally lemonade or OJ    Usual physical activity: limited  Progress Towards Goal(s):  In progress.   Nutritional Diagnosis:  NB-1.1 Food and nutrition-related knowledge deficit As related to low sodium diet.  As evidenced by patient statement "I need to know what and what not to eat".    Intervention:  Nutrition education and counseling.  Stated sodium intake recommendations of 1800-2000 mg or less per day.  Provided  handouts on foods highest in sodium and foods recommended with a sample menu.  Discussed tracking sodium intake using log sheet as he finds meals/food items that work well with his plan.  Discussed his low appetite and recommended protein meal replacement shakes as an option for him to have on hand when food is not appetizing.  Patient states he has found a brand of protein shake he likes and plans to buy more. States importance of protein and encouraged low-sodium protein foods at each meal and snack.   Teaching Method Utilized: Visual Auditory  Handouts given during visit include:  NCM Heart Failure Nutrition Therapy  Hypertension/DASH diet  Heart Healthy Foods  Low Sodium Flavoring Options  Sodium Intake Log  Barriers to learning/adherence to lifestyle change: none  Demonstrated degree of understanding via:  Teach Back   Monitoring/Evaluation:  Dietary intake, exercise, and body weight prn.  Patient requested a one-time educational visit.  Provided business card for any future follow-up desired.

## 2014-12-09 NOTE — Telephone Encounter (Signed)
Pt called/ informed "Hgb holding @ 10; ok to travel to [place]" per Dr Beryle Beams. Pt stated Merry Christmas and he will see [place] on the 4th for INR check.

## 2014-12-09 NOTE — Telephone Encounter (Signed)
-----   Message from Annia Belt, MD sent at 12/09/2014  1:57 PM EST ----- Call pt:  Hb holding at 10  OK to travel to [place]

## 2014-12-12 ENCOUNTER — Encounter (HOSPITAL_COMMUNITY): Payer: Medicare Other

## 2014-12-12 ENCOUNTER — Ambulatory Visit (INDEPENDENT_AMBULATORY_CARE_PROVIDER_SITE_OTHER): Payer: Medicare Other | Admitting: Pharmacist

## 2014-12-12 ENCOUNTER — Other Ambulatory Visit: Payer: Self-pay

## 2014-12-12 DIAGNOSIS — I81 Portal vein thrombosis: Secondary | ICD-10-CM

## 2014-12-12 DIAGNOSIS — D62 Acute posthemorrhagic anemia: Secondary | ICD-10-CM

## 2014-12-12 DIAGNOSIS — Z7902 Long term (current) use of antithrombotics/antiplatelets: Secondary | ICD-10-CM

## 2014-12-12 DIAGNOSIS — I951 Orthostatic hypotension: Secondary | ICD-10-CM

## 2014-12-12 LAB — CBC
HEMATOCRIT: 30.4 % — AB (ref 39.0–52.0)
HEMOGLOBIN: 9.7 g/dL — AB (ref 13.0–17.0)
MCH: 29 pg (ref 26.0–34.0)
MCHC: 31.9 g/dL (ref 30.0–36.0)
MCV: 90.7 fL (ref 78.0–100.0)
MPV: 10.5 fL (ref 9.4–12.4)
Platelets: 271 10*3/uL (ref 150–400)
RBC: 3.35 MIL/uL — ABNORMAL LOW (ref 4.22–5.81)
RDW: 21.9 % — ABNORMAL HIGH (ref 11.5–15.5)
WBC: 7.5 10*3/uL (ref 4.0–10.5)

## 2014-12-12 LAB — POCT INR: INR: 2.7

## 2014-12-12 NOTE — Progress Notes (Signed)
Patient ID: KULLEN TOMASETTI, male   DOB: 10/27/1928, 80 y.o.   MRN: [id number]  I was asked to see Mr. Alcalde by Dr. Elie Confer in the anticoagulation clinic, there were no available morning appointments.   S: Mr. Hoogland is an 80yo man, patient of Dr. Beryle Beams, with PMH of BPH, CAD, chronic dCHF, h/o GIB, portal vein thrombosis, history of DVT on coumadin.  He has a very narrow treatment window for his coumadin given history of GIB.  Mr. Mysliwiec has a history of gait instability and is most commonly in a wheelchair, even to check the mail.  However, when he rose to come in to the clinic today, he had a pre-syncopal event stating that "I feel like I am going to faint."  He noted being in his normal state of health this morning.  He had not had a recent episode of getting dizzy upon standing.  Orthostatic vital signs were positive (below).  He further has SOB, however, he notes that this is chronic.  His most recent INR prior to this visit was 2.3 on 12/05/14.  He denies recent chest pain, recent illness, dizziness or lightheadedness prior to today, LE edema, pain.  He has had blood in his stool noted on 3 FOBT cards recently sent in to the clinic.  He required a blood transfusion earlier this month for a Hgb of 7.6.  He is due to drive to [place] for the holidays on Wednesday.    Upon further questioning, Mr. Keisling reports that his blood pressure is often as low as 45Y systolic and he checks his blood pressure 2-3 times daily.  He notes that he walks around his home with ease and has not had issues despite his pressure being at this number.    O:   BP lying: 123/52, pulse 64 BP sitting: 120/44, pulse 66 BP stading 94/44, pulse 78  Gen: Alert and oriented, visibly SOB, answering all questions appropriately Eyes: + conjunctival pallor, EOMI CAD: RR, NR, no murmur noted Lungs: CTAB, no wheezing Abd: Soft, +BS Ext: + delayed cap refill and blanched nail beds, + mild edema and tenderness to light palpation in bilateral  legs (chronic per patient) Neuro: Grossly intact, gait normal, no further episode of dizziness upon standing (walked in clinic)  Assessment: Mr. Heideman is an 80yo man with PMH as noted above with apparently new orthostatic hypotension, concern for acute blood loss and symptomatic anemia vs. Overdiuresis for his CHF  Plan - Stat CBC: Hgb 9.7, stable - Patient is resistant to ED evaluation or admission at this time.  - Will plan to have patient decrease his lasix to 40mg  daily and see if his symptoms improve.  He possibly has been over-diuresed with Lasix 80mg  and spironolactone 25mg , though he does not endorse decreased PO intake.  He is to call the clinic if having any further dizzy spells or lightheadedness to be seen prior to leaving for [place] for the holidays.  - He may require a lower dose of diuretics going forward, would defer to his Cardiologist.     Gilles Chiquito, MD

## 2014-12-12 NOTE — Patient Instructions (Addendum)
[name], you developed something called Orthostatic Hypotension while in clinic.  This is an issue where your blood pressure drops low upon first standing (more information below).  We checked your hemoglobin and it was normal, so I think your low blood pressure may have been coming from your diuretic medications (lasix and spironolactone).   I would like for you to take only 40mg  of lasix (1 tablet) daily for the next 2 days and then you can increase to your normal 80mg  of lasix daily.  Please continue to check your blood pressure daily and please call the clinic if you have any more episodes of lightheadedness.     Orthostatic Hypotension Orthostatic hypotension is a sudden drop in blood pressure. It happens when you quickly stand up from a seated or lying position. You may feel dizzy or light-headed. This can last for just a few seconds or for up to a few minutes. It is usually not a serious problem. However, if this happens frequently or gets worse, it can be a sign of something more serious. CAUSES  Different things can cause orthostatic hypotension, including:   Loss of body fluids (dehydration).  Medicines that lower blood pressure.  Sudden changes in posture, such as standing up quickly after you have been sitting or lying down.  Taking too much of your medicine. SIGNS AND SYMPTOMS   Light-headedness or dizziness.   Fainting or near-fainting.   A fast heart rate.   Weakness.   Feeling tired (fatigue).  DIAGNOSIS  Your health care provider may do several things to help diagnose your condition and identify the cause. These may include:   Taking a medical history and doing a physical exam.  Checking your blood pressure. Your health care provider will check your blood pressure when you are:  Lying down.  Sitting.  Standing.  Using tilt table testing. In this test, you lie down on a table that moves from a lying position to a standing position. You will be strapped  onto the table. This test monitors your blood pressure and heart rate when you are in different positions. TREATMENT  Treatment will vary depending on the cause. Possible treatments include:   Changing the dosage of your medicines.  Wearing compression stockings on your lower legs.  Standing up slowly after sitting or lying down.  Eating more salt.  Eating frequent, small meals.  In some cases, getting IV fluids.  Taking medicine to enhance fluid retention. HOME CARE INSTRUCTIONS  Only take over-the-counter or prescription medicines as directed by your health care provider.  Follow your health care provider's instructions for changing the dosage of your current medicines.  Do not stop or adjust your medicine on your own.  Stand up slowly after sitting or lying down. This allows your body to adjust to the different position.  Wear compression stockings as directed.  Eat extra salt as directed.  Do not add extra salt to your diet unless directed to by your health care provider.  Eat frequent, small meals.  Avoid standing suddenly after eating.  Avoid hot showers or excessive heat as directed by your health care provider.  Keep all follow-up appointments. SEEK MEDICAL CARE IF:  You continue to feel dizzy or light-headed after standing.  You feel groggy or confused.  You feel cold, clammy, or sick to your stomach (nauseous).  You have blurred vision.  You feel short of breath. SEEK IMMEDIATE MEDICAL CARE IF:   You faint after standing.  You have chest  pain.  You have difficulty breathing.   You lose feeling or movement in your arms or legs.   You have slurred speech or difficulty talking, or you are unable to talk.  MAKE SURE YOU:   Understand these instructions.  Will watch your condition.  Will get help right away if you are not doing well or get worse. Document Released: 11/29/2002 Document Revised: 12/14/2013 Document Reviewed:  10/01/2013 [facility] Patient Information 2015 [place], [place]. This information is not intended to replace advice given to you by your health care provider. Make sure you discuss any questions you have with your health care provider. Patient instructed to take medications as defined in the Anti-coagulation Track section of this encounter.  Patient instructed to take today's dose.  Patient verbalized understanding of these instructions.

## 2014-12-12 NOTE — Progress Notes (Signed)
Anti-Coagulation Progress Note  James [name] is a 80 y.o. male who is currently on an anti-coagulation regimen.    RECENT RESULTS: Recent results are below, the most recent result is correlated with a dose of 40 mg. per week: Lab Results  Component Value Date   INR 2.70 12/12/2014   INR 2.30 12/05/2014   INR 2.1 11/21/2014    ANTI-COAG DOSE: Anticoagulation Dose Instructions as of 12/12/2014      [name] Tue Wed Thu Fri Sat   New Dose 5 mg 2.5 mg 5 mg 5 mg 5 mg 5 mg 5 mg       ANTICOAG SUMMARY: Anticoagulation Episode Summary    Current INR goal 2.0-2.5  Next INR check 12/21/2014  INR from last check 2.70! (12/12/2014)  Weekly max dose   Target end date Indefinite  [facility]  Preferred lab   Send INR reminders to    Indications  Portal vein thrombosis [I81] Encounter for long-term (current) use of antiplatelets/antithrombotics [Z79.02]        Comments Target range is actually 2.0 - 2.3 (amended after initially having established when patient had GI Bleed with INR = 2.4)      Anticoagulation Care Providers    Provider Role Specialty Phone number   Annia Belt, MD  Oncology [phone]      ANTICOAG TODAY: Anticoagulation Summary as of 12/12/2014    INR goal 2.0-2.5  Selected INR 2.70! (12/12/2014)  Next INR check 12/21/2014  Target end date Indefinite   Indications  Portal vein thrombosis [I81] Encounter for long-term (current) use of antiplatelets/antithrombotics [Z79.02]      Anticoagulation Episode Summary    [facility]   Preferred lab    Send INR reminders to    Comments Target range is actually 2.0 - 2.3 (amended after initially having established when patient had GI Bleed with INR = 2.4)    Anticoagulation Care Providers    Provider Role Specialty Phone number   Annia Belt, MD  Oncology [phone]      PATIENT INSTRUCTIONS: Patient Instructions  James [name], you developed  something called Orthostatic Hypotension while in clinic.  This is an issue where your blood pressure drops low upon first standing (more information below).  We checked your hemoglobin and it was normal, so I think your low blood pressure may have been coming from your diuretic medications (lasix and spironolactone).   I would like for you to take only 40mg  of lasix (1 tablet) daily for the next 2 days and then you can increase to your normal 80mg  of lasix daily.  Please continue to check your blood pressure daily and please call the clinic if you have any more episodes of lightheadedness.     Orthostatic Hypotension Orthostatic hypotension is a sudden drop in blood pressure. It happens when you quickly stand up from a seated or lying position. You may feel dizzy or light-headed. This can last for just a few seconds or for up to a few minutes. It is usually not a serious problem. However, if this happens frequently or gets worse, it can be a sign of something more serious. CAUSES  Different things can cause orthostatic hypotension, including:   Loss of body fluids (dehydration).  Medicines that lower blood pressure.  Sudden changes in posture, such as standing up quickly after you have been sitting or lying down.  Taking too much of your medicine. SIGNS AND SYMPTOMS  Light-headedness or dizziness.   Fainting or near-fainting.   A fast heart rate.   Weakness.   Feeling tired (fatigue).  DIAGNOSIS  Your health care provider may do several things to help diagnose your condition and identify the cause. These may include:   Taking a medical history and doing a physical exam.  Checking your blood pressure. Your health care provider will check your blood pressure when you are:  Lying down.  Sitting.  Standing.  Using tilt table testing. In this test, you lie down on a table that moves from a lying position to a standing position. You will be strapped onto the table. This test  monitors your blood pressure and heart rate when you are in different positions. TREATMENT  Treatment will vary depending on the cause. Possible treatments include:   Changing the dosage of your medicines.  Wearing compression stockings on your lower legs.  Standing up slowly after sitting or lying down.  Eating more salt.  Eating frequent, small meals.  In some cases, getting IV fluids.  Taking medicine to enhance fluid retention. HOME CARE INSTRUCTIONS  Only take over-the-counter or prescription medicines as directed by your health care provider.  Follow your health care provider's instructions for changing the dosage of your current medicines.  Do not stop or adjust your medicine on your own.  Stand up slowly after sitting or lying down. This allows your body to adjust to the different position.  Wear compression stockings as directed.  Eat extra salt as directed.  Do not add extra salt to your diet unless directed to by your health care provider.  Eat frequent, small meals.  Avoid standing suddenly after eating.  Avoid hot showers or excessive heat as directed by your health care provider.  Keep all follow-up appointments. SEEK MEDICAL CARE IF:  You continue to feel dizzy or light-headed after standing.  You feel groggy or confused.  You feel cold, clammy, or sick to your stomach (nauseous).  You have blurred vision.  You feel short of breath. SEEK IMMEDIATE MEDICAL CARE IF:   You faint after standing.  You have chest pain.  You have difficulty breathing.   You lose feeling or movement in your arms or legs.   You have slurred speech or difficulty talking, or you are unable to talk.  MAKE SURE YOU:   Understand these instructions.  Will watch your condition.  Will get help right away if you are not doing well or get worse. Document Released: 11/29/2002 Document Revised: 12/14/2013 Document Reviewed: 10/01/2013 [facility] Patient Information  2015 [place], [place]. This information is not intended to replace advice given to you by your health care provider. Make sure you discuss any questions you have with your health care provider. Patient instructed to take medications as defined in the Anti-coagulation Track section of this encounter.  Patient instructed to take today's dose.  Patient verbalized understanding of these instructions.       FOLLOW-UP Return in 9 days (on 12/21/2014) for Follow up INR after his carotid artery study. [name], III Pharm.D., CACP

## 2014-12-14 ENCOUNTER — Encounter (HOSPITAL_COMMUNITY): Payer: Medicare Other

## 2014-12-16 ENCOUNTER — Encounter (HOSPITAL_COMMUNITY): Admission: RE | Admit: 2014-12-16 | Payer: Medicare Other | Source: Ambulatory Visit

## 2014-12-19 ENCOUNTER — Encounter (HOSPITAL_COMMUNITY): Payer: Medicare Other

## 2014-12-21 ENCOUNTER — Ambulatory Visit (INDEPENDENT_AMBULATORY_CARE_PROVIDER_SITE_OTHER): Payer: Medicare Other | Admitting: Pharmacist

## 2014-12-21 ENCOUNTER — Encounter (HOSPITAL_COMMUNITY): Payer: Medicare Other

## 2014-12-21 ENCOUNTER — Ambulatory Visit (HOSPITAL_COMMUNITY): Payer: Medicare Other

## 2014-12-21 DIAGNOSIS — Z7902 Long term (current) use of antithrombotics/antiplatelets: Secondary | ICD-10-CM

## 2014-12-21 DIAGNOSIS — Z7901 Long term (current) use of anticoagulants: Secondary | ICD-10-CM

## 2014-12-21 DIAGNOSIS — I81 Portal vein thrombosis: Secondary | ICD-10-CM

## 2014-12-21 NOTE — Progress Notes (Signed)
Results called to Dr. Elie Confer by Renato Battles at 9:53am on 12/21/14

## 2014-12-22 ENCOUNTER — Ambulatory Visit (HOSPITAL_COMMUNITY): Payer: Medicare Other | Attending: Cardiovascular Disease | Admitting: *Deleted

## 2014-12-22 DIAGNOSIS — E785 Hyperlipidemia, unspecified: Secondary | ICD-10-CM | POA: Diagnosis not present

## 2014-12-22 DIAGNOSIS — I6322 Cerebral infarction due to unspecified occlusion or stenosis of basilar arteries: Secondary | ICD-10-CM

## 2014-12-22 DIAGNOSIS — I6523 Occlusion and stenosis of bilateral carotid arteries: Secondary | ICD-10-CM

## 2014-12-22 DIAGNOSIS — I2510 Atherosclerotic heart disease of native coronary artery without angina pectoris: Secondary | ICD-10-CM | POA: Diagnosis not present

## 2014-12-22 DIAGNOSIS — I10 Essential (primary) hypertension: Secondary | ICD-10-CM | POA: Diagnosis not present

## 2014-12-22 DIAGNOSIS — R0989 Other specified symptoms and signs involving the circulatory and respiratory systems: Secondary | ICD-10-CM | POA: Diagnosis present

## 2014-12-22 DIAGNOSIS — Z87891 Personal history of nicotine dependence: Secondary | ICD-10-CM | POA: Insufficient documentation

## 2014-12-22 NOTE — Progress Notes (Signed)
Carotid Duplex Exam performed. In the 1-39% range bilaterally stable over serial exams - no routine follow up.

## 2014-12-23 ENCOUNTER — Encounter (HOSPITAL_COMMUNITY): Payer: Medicare Other

## 2014-12-26 ENCOUNTER — Encounter (HOSPITAL_COMMUNITY): Payer: Medicare Other

## 2014-12-26 ENCOUNTER — Ambulatory Visit (INDEPENDENT_AMBULATORY_CARE_PROVIDER_SITE_OTHER): Payer: Medicare Other | Admitting: Pharmacist

## 2014-12-26 DIAGNOSIS — Z7902 Long term (current) use of antithrombotics/antiplatelets: Secondary | ICD-10-CM

## 2014-12-26 DIAGNOSIS — I81 Portal vein thrombosis: Secondary | ICD-10-CM

## 2014-12-26 LAB — POCT INR: INR: 2

## 2014-12-26 NOTE — Patient Instructions (Signed)
Patient instructed to take medications as defined in the Anti-coagulation Track section of this encounter.  Patient instructed to take today's dose.  Bridging Instructions for James [name] MRN [id number] Date of Birth: 1928-09-15 1. Five (5) days BEFORE your planned procedure-STOP taking your warfarin/Coumadin. Friday, January 8th, 2016. 2. Four (4) days BEFORE your planned procedure-do NOT take warfarin/Coumadin. Saturday, January 9th, 2016. 3. Three (3) days BEFORE your planned procedure-do NOT take warfarin/Coumadin. On this day-you will begin giving yourself the low-molecular weight heparin by syringe that you should have available. Give this injection at 8:00PM, three (3) days before planned procedure. Sunday, January 10th, 2016. 4. Two (2) days BEFORE your planned procedure-do NOT take warfarin/Coumadin. On this day-you will give yourself the low-molecular weight heparin by syringe that you should have available. Give this injection at 8:00AM AND at 8:00PM. Monday, January 11th, 2016. 5. One (1) day BEFORE your planned procedure-do NOT take warfarin/Coumadin. On this day-you will give yourself the low-molecular weight heparin by syringe that you should have available. Give this injection at 8:00AM. This is your LAST DOSE BEFORE YOUR PROCEDURE tomorrow. Tuesday, January 12th, 2016.  6. DAY OF PROCEDURE. NO WARFARIN. NO low-molecular-weight-heparin. Wednesday, January 13th, 2016.  7. Day 1 after procedure-if your Physician or Surgeon indicates it is appropriate-commence your warfarin the evening after your procedure-using your instructions last provided by Dr. Elie Confer Thursday, January 14th, 2016.  8. Call the clinic at [phone] to schedule an appointment with Dr. Elie Confer within the first week    Patient verbalized understanding of these instructions.

## 2014-12-26 NOTE — Progress Notes (Signed)
Anti-Coagulation Progress Note  James [name] is a 81 y.o. male who is currently on an anti-coagulation regimen.    RECENT RESULTS: Recent results are below, the most recent result is correlated with a dose of daily dosing since 30-December-15 at which time his INR was 1.3 and he was instructed to take 7.5mg  warfarin (1& 1/2 x 5mg  tablets) for TWO days, then decrease base to 5mg  qd and come to me in clinic today. Patient has a capsule endoscopy study planned for 7-JAN-16 by Dr. Carlean Purl. Patient states he was NOT instructed to discontinue warfarin for this procedure. He has an additional procedure by urologist on 13-JAN-16 for which he WAS advised to DISCONTINUE warfarin 5 days prior to procedure. The following "Bridging" instructions were provided to the patient:  Bridging Instructions for James [name] MRN [id number] Date of Birth: 02-Oct-1928 1. Five (5) days BEFORE your planned procedure-STOP taking your warfarin/Coumadin. Friday, January 8th, 2016. 2. Four (4) days BEFORE your planned procedure-do NOT take warfarin/Coumadin. Saturday, January 9th, 2016. 3. Three (3) days BEFORE your planned procedure-do NOT take warfarin/Coumadin. On this day-you will begin giving yourself the low-molecular weight heparin by syringe that you should have available. Give this injection at 8:00PM, three (3) days before planned procedure. Sunday, January 10th, 2016. 4. Two (2) days BEFORE your planned procedure-do NOT take warfarin/Coumadin. On this day-you will give yourself the low-molecular weight heparin by syringe that you should have available. Give this injection at 8:00AM AND at 8:00PM. Monday, January 11th, 2016. 5. One (1) day BEFORE your planned procedure-do NOT take warfarin/Coumadin. On this day-you will give yourself the low-molecular weight heparin by syringe that you should have available. Give this injection at 8:00AM. This is your LAST DOSE BEFORE YOUR PROCEDURE tomorrow. Tuesday, January 12th, 2016.   6. DAY OF PROCEDURE. NO WARFARIN. NO low-molecular-weight-heparin. Wednesday, January 13th, 2016.  7. Day 1 after procedure-if your Physician or Surgeon indicates it is appropriate-commence your warfarin the evening after your procedure-using your instructions last provided by Dr. Elie Confer Thursday, January 14th, 2016.  8. Call the clinic at [phone] to schedule an appointment with Dr. Elie Confer within the first week    Patient verbalized understanding of these instructions. Patient was provided the required number of Lovenox 100mg  syringes (actual weigh is 110kg--but given the indications for these studies--to determine potential blood loss sites--the dose was rounded DOWN to 100mg  to also coincide with a commercially available syringe strength).   Lab Results  Component Value Date   INR 2.0 12/26/2014   INR 2.70 12/12/2014   INR 2.30 12/05/2014    ANTI-COAG DOSE: Anticoagulation Dose Instructions as of 12/26/2014      [name] Tue Wed Thu Fri Sat   New Dose Hold 5 mg 5 mg 5 mg 5 mg Hold Hold       ANTICOAG SUMMARY: Anticoagulation Episode Summary    Current INR goal 2.0-2.5  Next INR check 01/09/2015  INR from last check 2.0 (12/26/2014)  Weekly max dose   Target end date Indefinite  [facility]  Preferred lab   Send INR reminders to    Indications  Portal vein thrombosis [I81] Encounter for long-term (current) use of antiplatelets/antithrombotics [Z79.02]        Comments Target range is actually 2.0 - 2.3 (amended after initially having established when patient had GI Bleed with INR = 2.4)      Anticoagulation Care Providers    Provider Role Specialty Phone number   Jeneen Rinks  Sondra Barges, MD  Oncology [phone]      ANTICOAG TODAY: Anticoagulation Summary as of 12/26/2014    INR goal 2.0-2.5  Selected INR 2.0 (12/26/2014)  Next INR check 01/09/2015  Target end date Indefinite   Indications  Portal vein thrombosis [I81] Encounter for long-term  (current) use of antiplatelets/antithrombotics [Z79.02]      Anticoagulation Episode Summary    [facility]   Preferred lab    Send INR reminders to    Comments Target range is actually 2.0 - 2.3 (amended after initially having established when patient had GI Bleed with INR = 2.4)    Anticoagulation Care Providers    Provider Role Specialty Phone number   Annia Belt, MD  Oncology [phone]      PATIENT INSTRUCTIONS: Patient Instructions  Patient instructed to take medications as defined in the Anti-coagulation Track section of this encounter.  Patient instructed to take today's dose.  Bridging Instructions for James [name] MRN [id number] Date of Birth: 03-30-1928 1. Five (5) days BEFORE your planned procedure-STOP taking your warfarin/Coumadin. Friday, January 8th, 2016. 2. Four (4) days BEFORE your planned procedure-do NOT take warfarin/Coumadin. Saturday, January 9th, 2016. 3. Three (3) days BEFORE your planned procedure-do NOT take warfarin/Coumadin. On this day-you will begin giving yourself the low-molecular weight heparin by syringe that you should have available. Give this injection at 8:00PM, three (3) days before planned procedure. Sunday, January 10th, 2016. 4. Two (2) days BEFORE your planned procedure-do NOT take warfarin/Coumadin. On this day-you will give yourself the low-molecular weight heparin by syringe that you should have available. Give this injection at 8:00AM AND at 8:00PM. Monday, January 11th, 2016. 5. One (1) day BEFORE your planned procedure-do NOT take warfarin/Coumadin. On this day-you will give yourself the low-molecular weight heparin by syringe that you should have available. Give this injection at 8:00AM. This is your LAST DOSE BEFORE YOUR PROCEDURE tomorrow. Tuesday, January 12th, 2016.  6. DAY OF PROCEDURE. NO WARFARIN. NO low-molecular-weight-heparin. Wednesday, January 13th, 2016.  7. Day 1 after procedure-if your  Physician or Surgeon indicates it is appropriate-commence your warfarin the evening after your procedure-using your instructions last provided by Dr. Elie Confer Thursday, January 14th, 2016.  8. Call the clinic at [phone] to schedule an appointment with Dr. Elie Confer within the first week    Patient verbalized understanding of these instructions.       FOLLOW-UP Return in about 2 weeks (around 01/09/2015) for Follow up INR at 0900h.  [name], III Pharm.D., CACP

## 2014-12-27 LAB — POCT INR: INR: 1.3

## 2014-12-27 NOTE — Patient Instructions (Signed)
Patient was instructed by phone while I was traveling in [place] attending a memorial service--to take 7.5mg  warfarin that evening (as 1&1/2 x 5mg  strength warfarin tablets). He was advised I would call him the following day to give additional dosing until seen in [facility] on 4-JAN-16. On the 31-DEC-15 his daughter Jeannene Patella was called and she was advised of the following dosing:  31-DEC-15 take 7.5mg  warfarin; 1-JAN-16 take 5mg  warfarin; 2-JAN-16 take 5mg  warfarin; 3-JAN-16 take 5mg  warfarin. 4-JAN-16 come to [facility] for INR--which he did, which was 2.0 (targeted range by Dr. Beryle Beams is 2.0 - 2.3).

## 2014-12-27 NOTE — Progress Notes (Signed)
I was out of hospital on [place] attending memorial service of a family friend in [place]. Received call from our [facility] lab indicating INR = 1.3  I called the patient and advised him to take 7.5mg  warfarin on 30-DEC-15 and that I would call on 31-DEC-15 and give subsequent dosing. On 31-DEC-15 I called [name] the patient's daughter and provided dosing of 7.5mg  on 31-DEC-15 and then 5mg  warfarin PO qd until seen in [facility] on 4-JAN-16.

## 2014-12-28 ENCOUNTER — Encounter (HOSPITAL_COMMUNITY): Payer: Medicare Other

## 2014-12-29 ENCOUNTER — Ambulatory Visit (INDEPENDENT_AMBULATORY_CARE_PROVIDER_SITE_OTHER): Payer: Medicare Other | Admitting: Internal Medicine

## 2014-12-29 DIAGNOSIS — R195 Other fecal abnormalities: Secondary | ICD-10-CM

## 2014-12-29 DIAGNOSIS — D500 Iron deficiency anemia secondary to blood loss (chronic): Secondary | ICD-10-CM

## 2014-12-29 NOTE — Progress Notes (Signed)
Patient here for capsule endoscopy. Tolerated procedure. Verbalizes understanding of written and verbal instructions. Lot # [id number] exp 10/2015

## 2014-12-30 ENCOUNTER — Encounter (HOSPITAL_COMMUNITY): Payer: Medicare Other

## 2015-01-02 ENCOUNTER — Encounter (HOSPITAL_COMMUNITY): Payer: Medicare Other

## 2015-01-04 ENCOUNTER — Encounter (HOSPITAL_COMMUNITY): Payer: Medicare Other

## 2015-01-05 ENCOUNTER — Telehealth: Payer: Self-pay | Admitting: Internal Medicine

## 2015-01-05 NOTE — Telephone Encounter (Signed)
Called about capsule endoscopy results - i.e. There is a small amount of blood seen in distal ileum.  He has a foley in after a cysto by Dr. Era Bumpers and says urine is bloody.  Apparently needs a bladder biopsy.  I will discuss w/ Dr. Beryle Beams and get back to him.

## 2015-01-06 ENCOUNTER — Encounter (HOSPITAL_COMMUNITY): Payer: Medicare Other

## 2015-01-10 ENCOUNTER — Ambulatory Visit (HOSPITAL_COMMUNITY)
Admission: RE | Admit: 2015-01-10 | Discharge: 2015-01-10 | Disposition: A | Discharge status: Home / Self Care | Payer: Medicare Other | Source: Ambulatory Visit | Attending: Oncology | Admitting: Oncology

## 2015-01-10 ENCOUNTER — Other Ambulatory Visit: Payer: Self-pay | Admitting: Oncology

## 2015-01-10 ENCOUNTER — Telehealth: Payer: Self-pay | Admitting: Oncology

## 2015-01-10 ENCOUNTER — Other Ambulatory Visit: Payer: Self-pay | Admitting: *Deleted

## 2015-01-10 ENCOUNTER — Ambulatory Visit: Payer: Medicare Other

## 2015-01-10 ENCOUNTER — Other Ambulatory Visit (INDEPENDENT_AMBULATORY_CARE_PROVIDER_SITE_OTHER): Payer: Medicare Other

## 2015-01-10 ENCOUNTER — Ambulatory Visit (INDEPENDENT_AMBULATORY_CARE_PROVIDER_SITE_OTHER): Payer: Medicare Other | Admitting: Pharmacist

## 2015-01-10 DIAGNOSIS — K922 Gastrointestinal hemorrhage, unspecified: Secondary | ICD-10-CM

## 2015-01-10 DIAGNOSIS — D500 Iron deficiency anemia secondary to blood loss (chronic): Secondary | ICD-10-CM

## 2015-01-10 DIAGNOSIS — I81 Portal vein thrombosis: Secondary | ICD-10-CM

## 2015-01-10 DIAGNOSIS — Z7902 Long term (current) use of antithrombotics/antiplatelets: Secondary | ICD-10-CM

## 2015-01-10 LAB — CBC WITH DIFFERENTIAL/PLATELET
BASOS ABS: 0.1 10*3/uL (ref 0.0–0.1)
Basophils Relative: 1 % (ref 0–1)
EOS ABS: 0.2 10*3/uL (ref 0.0–0.7)
EOS PCT: 2 % (ref 0–5)
HEMATOCRIT: 24.6 % — AB (ref 39.0–52.0)
HEMOGLOBIN: 7.6 g/dL — AB (ref 13.0–17.0)
LYMPHS ABS: 1.2 10*3/uL (ref 0.7–4.0)
Lymphocytes Relative: 14 % (ref 12–46)
MCH: 27 pg (ref 26.0–34.0)
MCHC: 30.9 g/dL (ref 30.0–36.0)
MCV: 87.5 fL (ref 78.0–100.0)
MONO ABS: 1 10*3/uL (ref 0.1–1.0)
Monocytes Relative: 12 % (ref 3–12)
Neutro Abs: 6.1 10*3/uL (ref 1.7–7.7)
Neutrophils Relative %: 71 % (ref 43–77)
PLATELETS: 446 10*3/uL — AB (ref 150–400)
RBC: 2.81 MIL/uL — ABNORMAL LOW (ref 4.22–5.81)
RDW: 19.6 % — ABNORMAL HIGH (ref 11.5–15.5)
WBC: 8.5 10*3/uL (ref 4.0–10.5)

## 2015-01-10 LAB — POCT INR: INR: 2

## 2015-01-10 LAB — HOLD TUBE, BLOOD BANK

## 2015-01-10 NOTE — Progress Notes (Signed)
Anti-Coagulation Progress Note  James [name] is a 81 y.o. male who is currently on an anti-coagulation regimen.    RECENT RESULTS: Recent results are below, the most recent result is correlated with a dose of 5mg  daily warfarin since after procedure last Wednesday 13-JAN-16. Results of CBC pending.  Lab Results  Component Value Date   INR 2.0 01/10/2015   INR 2.0 12/26/2014   INR 1.3 12/21/2014    ANTI-COAG DOSE: Anticoagulation Dose Instructions as of 01/10/2015      [name] Tue Wed Thu Fri Sat   New Dose 5 mg 2.5 mg 5 mg 5 mg 2.5 mg 5 mg 5 mg       ANTICOAG SUMMARY: Anticoagulation Episode Summary    Current INR goal 2.0-2.5  Next INR check 01/16/2015  INR from last check   Weekly max dose   Target end date Indefinite  [facility]  Preferred lab   Send INR reminders to    Indications  Portal vein thrombosis [I81] Encounter for long-term (current) use of antiplatelets/antithrombotics [Z79.02]        Comments Target range is actually 2.0 - 2.3 (amended after initially having established when patient had GI Bleed with INR = 2.4)      Anticoagulation Care Providers    Provider Role Specialty Phone number   Annia Belt, MD  Oncology [phone]      ANTICOAG TODAY: Anticoagulation Summary as of 01/10/2015    INR goal 2.0-2.5  Selected INR   Next INR check 01/16/2015  Target end date Indefinite   Indications  Portal vein thrombosis [I81] Encounter for long-term (current) use of antiplatelets/antithrombotics [Z79.02]      Anticoagulation Episode Summary    [facility]   Preferred lab    Send INR reminders to    Comments Target range is actually 2.0 - 2.3 (amended after initially having established when patient had GI Bleed with INR = 2.4)    Anticoagulation Care Providers    Provider Role Specialty Phone number   Annia Belt, MD  Oncology [phone]      PATIENT INSTRUCTIONS: Patient  Instructions  Patient instructed to take medications as defined in the Anti-coagulation Track section of this encounter.  Patient instructed to take today's dose.  Patient verbalized understanding of these instructions.       FOLLOW-UP Return in 6 days (on 01/16/2015) for Follow up INR at 1000h.  [name], III Pharm.D., CACP  Anti-Coagulation Progress Note  James [name] is a 81 y.o. male who is currently on an anti-coagulation regimen.    RECENT RESULTS: Recent results are below, the most recent result is correlated with a dose of 5mg  warfarin by mouth daily since procedure last Wednesday 13-JAN-16.  Lab Results  Component Value Date   INR 2.0 01/10/2015   INR 2.0 12/26/2014   INR 1.3 12/21/2014    ANTI-COAG DOSE: Anticoagulation Dose Instructions as of 01/10/2015      [name] Tue Wed Thu Fri Sat   New Dose 5 mg 2.5 mg 5 mg 5 mg 2.5 mg 5 mg 5 mg       ANTICOAG SUMMARY: Anticoagulation Episode Summary    Current INR goal 2.0-2.5  Next INR check 01/16/2015  INR from last check   Weekly max dose   Target end date Indefinite  [facility]  Preferred lab   Send INR reminders to    Indications  Portal vein  thrombosis [I81] Encounter for long-term (current) use of antiplatelets/antithrombotics [Z79.02]        Comments Target range is actually 2.0 - 2.3 (amended after initially having established when patient had GI Bleed with INR = 2.4)      Anticoagulation Care Providers    Provider Role Specialty Phone number   Annia Belt, MD  Oncology [phone]      ANTICOAG TODAY: Anticoagulation Summary as of 01/10/2015    INR goal 2.0-2.5  Selected INR   Next INR check 01/16/2015  Target end date Indefinite   Indications  Portal vein thrombosis [I81] Encounter for long-term (current) use of antiplatelets/antithrombotics [Z79.02]      Anticoagulation Episode Summary    [facility]   Preferred lab    Send INR  reminders to    Comments Target range is actually 2.0 - 2.3 (amended after initially having established when patient had GI Bleed with INR = 2.4)    Anticoagulation Care Providers    Provider Role Specialty Phone number   Annia Belt, MD  Oncology [phone]      PATIENT INSTRUCTIONS: Patient Instructions  Patient instructed to take medications as defined in the Anti-coagulation Track section of this encounter.  Patient instructed to take today's dose.  Patient verbalized understanding of these instructions.       FOLLOW-UP Return in 6 days (on 01/16/2015) for Follow up INR at 1000h.  [name], III Pharm.D., CACP

## 2015-01-10 NOTE — Telephone Encounter (Signed)
lvm for pt regarding to today adn friday appt.Marland KitchenMarland KitchenMarland Kitchen

## 2015-01-10 NOTE — Telephone Encounter (Signed)
pt came in to get sched done...gv and printed

## 2015-01-10 NOTE — Patient Instructions (Signed)
Patient instructed to take medications as defined in the Anti-coagulation Track section of this encounter.  Patient instructed to take today's dose.  Patient verbalized understanding of these instructions.    

## 2015-01-11 ENCOUNTER — Telehealth: Payer: Self-pay | Admitting: Internal Medicine

## 2015-01-11 NOTE — Telephone Encounter (Signed)
Discussed situation with patient again He needs a colonoscopy to evaluate and treat GI bleeding - had blood in terminal ileum on capsule endoscopy. Says hematuria is gone Is having intermittent melena  Is on warfarin and clopidogrel  Ideally would hold both before colonoscopy though not 100% required.  Also needs a urologic procedure and best practice if possible would be to do colonoscopy and bladder procedure close together to minimize time off A/C and anti-PLT

## 2015-01-12 NOTE — Telephone Encounter (Signed)
Thanks again - it appears from Dr Marcial Pacas note that he would prefer that we don't hold the Plavix.

## 2015-01-12 NOTE — Telephone Encounter (Signed)
I spoke to Dr. Era Bumpers who is not planning to perform cystoscopy/bx/mitomycin infusion due to patients co-morbidities. I explained that to James [name].  I offered a colonoscopy in 1 week - 1/29 but his daughter is gone then and he prefers to wait.  He believes it is ok to hold clopdogrel x 5 d per Dr. Aundra Dubin it seems.  I will double check and get colonoscopy arranged when he can make it in Feb.

## 2015-01-12 NOTE — Telephone Encounter (Signed)
If possible, would continue Plavix (can hold coumadin) given DES in 7/15.

## 2015-01-13 ENCOUNTER — Telehealth: Payer: Self-pay | Admitting: *Deleted

## 2015-01-13 ENCOUNTER — Ambulatory Visit (HOSPITAL_BASED_OUTPATIENT_CLINIC_OR_DEPARTMENT_OTHER): Payer: Medicare Other

## 2015-01-13 VITALS — BP 108/66 | HR 65 | Temp 97.8°F | Resp 20

## 2015-01-13 DIAGNOSIS — D500 Iron deficiency anemia secondary to blood loss (chronic): Secondary | ICD-10-CM

## 2015-01-13 LAB — PREPARE RBC (CROSSMATCH)

## 2015-01-13 MED ORDER — FUROSEMIDE 10 MG/ML IJ SOLN
20.0000 mg | Freq: Once | INTRAMUSCULAR | Status: AC
  Administered 2015-01-13: 20 mg via INTRAVENOUS

## 2015-01-13 MED ORDER — SODIUM CHLORIDE 0.9 % IV SOLN: 250.0000 mL | Freq: Once | INTRAVENOUS | Status: DC

## 2015-01-13 MED ORDER — SODIUM CHLORIDE 0.9 % IV SOLN
250.0000 mL | Freq: Once | INTRAVENOUS | Status: AC
  Administered 2015-01-13: 250 mL via INTRAVENOUS

## 2015-01-13 NOTE — Telephone Encounter (Signed)
Called patien tot come in early

## 2015-01-13 NOTE — Patient Instructions (Signed)

## 2015-01-15 LAB — TYPE AND SCREEN
ABO/RH(D): A NEG
ANTIBODY SCREEN: NEGATIVE
UNIT DIVISION: 0
Unit division: 0

## 2015-01-16 ENCOUNTER — Encounter: Payer: Self-pay | Admitting: Internal Medicine

## 2015-01-16 ENCOUNTER — Ambulatory Visit (INDEPENDENT_AMBULATORY_CARE_PROVIDER_SITE_OTHER): Payer: Medicare Other | Admitting: Pharmacist

## 2015-01-16 ENCOUNTER — Telehealth: Payer: Self-pay

## 2015-01-16 DIAGNOSIS — Z7901 Long term (current) use of anticoagulants: Secondary | ICD-10-CM

## 2015-01-16 DIAGNOSIS — Z7902 Long term (current) use of antithrombotics/antiplatelets: Secondary | ICD-10-CM

## 2015-01-16 DIAGNOSIS — I81 Portal vein thrombosis: Secondary | ICD-10-CM

## 2015-01-16 LAB — POCT INR: INR: 2.7

## 2015-01-16 NOTE — Telephone Encounter (Signed)
Please see if we can arrange for him to have a colonoscopy 2/3 early PM at [facility] or [facility]  Reason is to evaluate and treat GI bleeding (+ blood in ileum at capsule)  Moderate or MAC is ok, will take MAC if available  Plan to hold warfarin x 3 days (have ok from Dr. Beryle Beams)  Will leave on clopidogrel per Dr. Claris Gladden recommendations (has DES and not yet in for 1 year)

## 2015-01-16 NOTE — Progress Notes (Signed)
Anti-Coagulation Progress Note  James [name] is a 81 y.o. male who is currently on an anti-coagulation regimen.    RECENT RESULTS: Recent results are below, the most recent result is correlated with a dose of 30 mg. per week: Lab Results  Component Value Date   INR 2.7 01/16/2015   INR 2.0 01/10/2015   INR 2.0 12/26/2014    ANTI-COAG DOSE: Anticoagulation Dose Instructions as of 01/16/2015      [name] Tue Wed Thu Fri Sat   New Dose 5 mg 2.5 mg 5 mg 2.5 mg 5 mg 2.5 mg 5 mg       ANTICOAG SUMMARY: Anticoagulation Episode Summary    Current INR goal 2.0-2.5  Next INR check 01/23/2015  INR from last check 2.7! (01/16/2015)  Weekly max dose   Target end date Indefinite  [facility]  Preferred lab   Send INR reminders to    Indications  Portal vein thrombosis [I81] Encounter for long-term (current) use of antiplatelets/antithrombotics [Z79.02]        Comments Target range is actually 2.0 - 2.3 (amended after initially having established when patient had GI Bleed with INR = 2.4)      Anticoagulation Care Providers    Provider Role Specialty Phone number   Annia Belt, MD  Oncology [phone]      ANTICOAG TODAY: Anticoagulation Summary as of 01/16/2015    INR goal 2.0-2.5  Selected INR 2.7! (01/16/2015)  Next INR check 01/23/2015  Target end date Indefinite   Indications  Portal vein thrombosis [I81] Encounter for long-term (current) use of antiplatelets/antithrombotics [Z79.02]      Anticoagulation Episode Summary    [facility]   Preferred lab    Send INR reminders to    Comments Target range is actually 2.0 - 2.3 (amended after initially having established when patient had GI Bleed with INR = 2.4)    Anticoagulation Care Providers    Provider Role Specialty Phone number   Annia Belt, MD  Oncology [phone]      PATIENT INSTRUCTIONS: Patient Instructions  Patient instructed to take  medications as defined in the Anti-coagulation Track section of this encounter.  Patient instructed to take today's dose.  Patient verbalized understanding of these instructions.       FOLLOW-UP Return in 7 days (on 01/23/2015) for Follow up INR at 0945h.  [name], III Pharm.D., CACP

## 2015-01-16 NOTE — Telephone Encounter (Signed)
This encounter was created in error - please disregard.

## 2015-01-16 NOTE — Telephone Encounter (Signed)
See phone note from Dr. Carlean Purl  Patient is scheduled for a colonoscopy for 01/25/15 1:30 at [facility].  He will come for a pre-visit tomorrow at 10:30

## 2015-01-16 NOTE — Patient Instructions (Signed)
Patient instructed to take medications as defined in the Anti-coagulation Track section of this encounter.  Patient instructed to take today's dose.  Patient verbalized understanding of these instructions.    

## 2015-01-17 ENCOUNTER — Ambulatory Visit (AMBULATORY_SURGERY_CENTER): Payer: Self-pay

## 2015-01-17 ENCOUNTER — Telehealth: Payer: Self-pay | Admitting: Pharmacist

## 2015-01-17 VITALS — Ht 71.5 in | Wt 240.0 lb

## 2015-01-17 DIAGNOSIS — Z800 Family history of malignant neoplasm of digestive organs: Secondary | ICD-10-CM

## 2015-01-17 NOTE — Telephone Encounter (Signed)
As per Dr. Beryle Beams and Dr. Carlean Purl:  Will HOLD warfarin commencing Sunday 31-JAN-16 through Wednesday 3-FEB-16. Will AFTER his colonoscopy--recommence BOTH warfarin + Lovenox 1mg /kg SQ q12h. He will get these syringes from me on Monday 1-FEB-16.

## 2015-01-17 NOTE — Telephone Encounter (Signed)
Noted  

## 2015-01-23 ENCOUNTER — Ambulatory Visit (INDEPENDENT_AMBULATORY_CARE_PROVIDER_SITE_OTHER): Payer: Medicare Other | Admitting: Pharmacist

## 2015-01-23 DIAGNOSIS — I81 Portal vein thrombosis: Secondary | ICD-10-CM

## 2015-01-23 DIAGNOSIS — Z7902 Long term (current) use of antithrombotics/antiplatelets: Secondary | ICD-10-CM

## 2015-01-23 LAB — POCT INR: INR: 3.4

## 2015-01-23 NOTE — Patient Instructions (Signed)
Patient instructed to take medications as defined in the Anti-coagulation Track section of this encounter.  Patient instructed to OMTI today's dose--CONTINUE to HOLD/DO NOT TAKE warfarin (as you have been doing since Saturday 30-JAN-16). No warfarin today, tomorrow or Wednesday. On Thursday, you will recommence warfarin based on the following instructions:  Su-5mg M-2.5mg T-5mg W-2.5mg Th-5mg F-2.5mg Sa-5mg .  1 day AFTER procedure, you will recommence warfarin AND give Lovenox 100mg  subcutaneously administered 2 inches off of the belly button, administering one of the syringes provided every 12 hours for 5 doses.   Patient verbalized understanding of these instructions.

## 2015-01-23 NOTE — Progress Notes (Signed)
Anti-Coagulation Progress Note  James [name] is a 81 y.o. male who is currently on an anti-coagulation regimen.    RECENT RESULTS: Recent results are below, the most recent result is correlated with a dose of warfarin having been HELD since this past Saturday's administered dose on 30-JAN-16. "Hold" began Sunday 31-JAN-16.  Lab Results  Component Value Date   INR 3.40 01/23/2015   INR 2.7 01/16/2015   INR 2.0 01/10/2015    ANTI-COAG DOSE: Anticoagulation Dose Instructions as of 01/23/2015      [name] Tue Wed Thu Fri Sat   New Dose Hold Hold Hold Hold 5 mg 2.5 mg 5 mg       ANTICOAG SUMMARY: Anticoagulation Episode Summary    Current INR goal 2.0-2.5  Next INR check 01/30/2015  INR from last check 3.40! (01/23/2015)  Weekly max dose   Target end date Indefinite  [facility]  Preferred lab   Send INR reminders to    Indications  Portal vein thrombosis [I81] Encounter for long-term (current) use of antiplatelets/antithrombotics [Z79.02]        Comments Target range is actually 2.0 - 2.3 (amended after initially having established when patient had GI Bleed with INR = 2.4)      Anticoagulation Care Providers    Provider Role Specialty Phone number   Annia Belt, MD  Oncology [phone]      ANTICOAG TODAY: Anticoagulation Summary as of 01/23/2015    INR goal 2.0-2.5  Selected INR 3.40! (01/23/2015)  Next INR check 01/30/2015  Target end date Indefinite   Indications  Portal vein thrombosis [I81] Encounter for long-term (current) use of antiplatelets/antithrombotics [Z79.02]      Anticoagulation Episode Summary    [facility]   Preferred lab    Send INR reminders to    Comments Target range is actually 2.0 - 2.3 (amended after initially having established when patient had GI Bleed with INR = 2.4)    Anticoagulation Care Providers    Provider Role Specialty Phone number   Annia Belt, MD  Oncology  [phone]      PATIENT INSTRUCTIONS: Patient Instructions  Patient instructed to take medications as defined in the Anti-coagulation Track section of this encounter.  Patient instructed to OMTI today's dose--CONTINUE to HOLD/DO NOT TAKE warfarin (as you have been doing since Saturday 30-JAN-16). No warfarin today, tomorrow or Wednesday. On Thursday, you will recommence warfarin based on the following instructions:  Su-5mg M-2.5mg T-5mg W-2.5mg Th-5mg F-2.5mg Sa-5mg .  1 day AFTER procedure, you will recommence warfarin AND give Lovenox 100mg  subcutaneously administered 2 inches off of the belly button, administering one of the syringes provided every 12 hours for 5 doses.   Patient verbalized understanding of these instructions.      FOLLOW-UP Return in 7 days (on 01/30/2015) for Follow up INR at 1000h.  [name], III Pharm.D., CACP

## 2015-01-25 ENCOUNTER — Encounter (HOSPITAL_COMMUNITY)
Admission: EM | Disposition: A | Discharge status: Home / Self Care | Payer: Self-pay | Source: Home / Self Care | Attending: Emergency Medicine

## 2015-01-25 ENCOUNTER — Encounter (HOSPITAL_COMMUNITY): Admission: RE | Payer: Self-pay | Source: Ambulatory Visit

## 2015-01-25 ENCOUNTER — Encounter (HOSPITAL_COMMUNITY): Payer: Self-pay

## 2015-01-25 ENCOUNTER — Ambulatory Visit (HOSPITAL_COMMUNITY): Admission: RE | Admit: 2015-01-25 | Payer: Medicare Other | Source: Ambulatory Visit | Admitting: Internal Medicine

## 2015-01-25 ENCOUNTER — Observation Stay (HOSPITAL_COMMUNITY)
Admission: EM | Admit: 2015-01-25 | Discharge: 2015-01-26 | Disposition: A | Discharge status: Home / Self Care | Payer: Medicare Other | Attending: Internal Medicine | Admitting: Internal Medicine

## 2015-01-25 ENCOUNTER — Telehealth: Payer: Self-pay | Admitting: Internal Medicine

## 2015-01-25 DIAGNOSIS — I10 Essential (primary) hypertension: Secondary | ICD-10-CM | POA: Diagnosis not present

## 2015-01-25 DIAGNOSIS — Z7901 Long term (current) use of anticoagulants: Secondary | ICD-10-CM | POA: Insufficient documentation

## 2015-01-25 DIAGNOSIS — D500 Iron deficiency anemia secondary to blood loss (chronic): Secondary | ICD-10-CM | POA: Insufficient documentation

## 2015-01-25 DIAGNOSIS — K625 Hemorrhage of anus and rectum: Secondary | ICD-10-CM | POA: Diagnosis present

## 2015-01-25 DIAGNOSIS — Z800 Family history of malignant neoplasm of digestive organs: Secondary | ICD-10-CM | POA: Insufficient documentation

## 2015-01-25 DIAGNOSIS — K921 Melena: Secondary | ICD-10-CM | POA: Diagnosis not present

## 2015-01-25 DIAGNOSIS — K922 Gastrointestinal hemorrhage, unspecified: Secondary | ICD-10-CM | POA: Diagnosis present

## 2015-01-25 DIAGNOSIS — K219 Gastro-esophageal reflux disease without esophagitis: Secondary | ICD-10-CM | POA: Diagnosis not present

## 2015-01-25 DIAGNOSIS — D509 Iron deficiency anemia, unspecified: Secondary | ICD-10-CM

## 2015-01-25 DIAGNOSIS — Z7902 Long term (current) use of antithrombotics/antiplatelets: Secondary | ICD-10-CM | POA: Diagnosis not present

## 2015-01-25 DIAGNOSIS — D120 Benign neoplasm of cecum: Secondary | ICD-10-CM | POA: Diagnosis not present

## 2015-01-25 DIAGNOSIS — D122 Benign neoplasm of ascending colon: Secondary | ICD-10-CM | POA: Diagnosis not present

## 2015-01-25 DIAGNOSIS — Z87891 Personal history of nicotine dependence: Secondary | ICD-10-CM | POA: Insufficient documentation

## 2015-01-25 DIAGNOSIS — M109 Gout, unspecified: Secondary | ICD-10-CM | POA: Diagnosis not present

## 2015-01-25 DIAGNOSIS — Z86010 Personal history of colonic polyps: Secondary | ICD-10-CM | POA: Insufficient documentation

## 2015-01-25 DIAGNOSIS — Z79899 Other long term (current) drug therapy: Secondary | ICD-10-CM | POA: Insufficient documentation

## 2015-01-25 DIAGNOSIS — I509 Heart failure, unspecified: Secondary | ICD-10-CM | POA: Insufficient documentation

## 2015-01-25 DIAGNOSIS — Z6833 Body mass index (BMI) 33.0-33.9, adult: Secondary | ICD-10-CM | POA: Diagnosis not present

## 2015-01-25 DIAGNOSIS — E780 Pure hypercholesterolemia: Secondary | ICD-10-CM | POA: Insufficient documentation

## 2015-01-25 DIAGNOSIS — M1990 Unspecified osteoarthritis, unspecified site: Secondary | ICD-10-CM | POA: Insufficient documentation

## 2015-01-25 DIAGNOSIS — D693 Immune thrombocytopenic purpura: Secondary | ICD-10-CM | POA: Diagnosis not present

## 2015-01-25 HISTORY — PX: COLONOSCOPY: SHX5424

## 2015-01-25 LAB — CBC WITH DIFFERENTIAL/PLATELET
BASOS PCT: 1 % (ref 0–1)
Basophils Absolute: 0 10*3/uL (ref 0.0–0.1)
Eosinophils Absolute: 0.1 10*3/uL (ref 0.0–0.7)
Eosinophils Relative: 1 % (ref 0–5)
HEMATOCRIT: 27.6 % — AB (ref 39.0–52.0)
Hemoglobin: 8.6 g/dL — ABNORMAL LOW (ref 13.0–17.0)
LYMPHS PCT: 14 % (ref 12–46)
Lymphs Abs: 1.1 10*3/uL (ref 0.7–4.0)
MCH: 26.5 pg (ref 26.0–34.0)
MCHC: 31.2 g/dL (ref 30.0–36.0)
MCV: 85.2 fL (ref 78.0–100.0)
MONOS PCT: 11 % (ref 3–12)
Monocytes Absolute: 0.9 10*3/uL (ref 0.1–1.0)
Neutro Abs: 5.7 10*3/uL (ref 1.7–7.7)
Neutrophils Relative %: 73 % (ref 43–77)
Platelets: 400 10*3/uL (ref 150–400)
RBC: 3.24 MIL/uL — ABNORMAL LOW (ref 4.22–5.81)
RDW: 18.6 % — AB (ref 11.5–15.5)
WBC: 7.8 10*3/uL (ref 4.0–10.5)

## 2015-01-25 LAB — COMPREHENSIVE METABOLIC PANEL
ALT: 39 U/L (ref 0–53)
AST: 30 U/L (ref 0–37)
Albumin: 3.7 g/dL (ref 3.5–5.2)
Alkaline Phosphatase: 117 U/L (ref 39–117)
Anion gap: 11 (ref 5–15)
BILIRUBIN TOTAL: 0.5 mg/dL (ref 0.3–1.2)
BUN: 24 mg/dL — AB (ref 6–23)
CALCIUM: 9.1 mg/dL (ref 8.4–10.5)
CO2: 22 mmol/L (ref 19–32)
Chloride: 100 mmol/L (ref 96–112)
Creatinine, Ser: 1.39 mg/dL — ABNORMAL HIGH (ref 0.50–1.35)
GFR, EST AFRICAN AMERICAN: 52 mL/min — AB (ref >90)
GFR, EST NON AFRICAN AMERICAN: 45 mL/min — AB (ref >90)
Glucose, Bld: 124 mg/dL — ABNORMAL HIGH (ref 70–99)
Potassium: 3.8 mmol/L (ref 3.5–5.1)
Sodium: 133 mmol/L — ABNORMAL LOW (ref 135–145)
TOTAL PROTEIN: 7 g/dL (ref 6.0–8.3)

## 2015-01-25 LAB — HEMOGLOBIN: HEMOGLOBIN: 7.6 g/dL — AB (ref 13.0–17.0)

## 2015-01-25 LAB — PROTIME-INR
INR: 2.98 — AB (ref 0.00–1.49)
PROTHROMBIN TIME: 31.2 s — AB (ref 11.6–15.2)

## 2015-01-25 LAB — APTT: aPTT: 59 seconds — ABNORMAL HIGH (ref 24–37)

## 2015-01-25 LAB — PREPARE RBC (CROSSMATCH)

## 2015-01-25 SURGERY — COLONOSCOPY
Anesthesia: Moderate Sedation

## 2015-01-25 MED ORDER — MORPHINE SULFATE 2 MG/ML IJ SOLN: 1.0000 mg | INTRAMUSCULAR | Status: DC | PRN

## 2015-01-25 MED ORDER — PANTOPRAZOLE SODIUM 40 MG PO TBEC
40.0000 mg | DELAYED_RELEASE_TABLET | Freq: Every day | ORAL | Status: DC
  Administered 2015-01-25: 40 mg via ORAL
  Filled 2015-01-25 (×2): qty 1

## 2015-01-25 MED ORDER — ONDANSETRON HCL 4 MG/2ML IJ SOLN: 4.0000 mg | Freq: Four times a day (QID) | INTRAMUSCULAR | Status: DC | PRN

## 2015-01-25 MED ORDER — ACETAMINOPHEN 325 MG PO TABS: 650.0000 mg | ORAL_TABLET | Freq: Four times a day (QID) | ORAL | Status: DC | PRN

## 2015-01-25 MED ORDER — HEPARIN SOD (PORK) LOCK FLUSH 100 UNIT/ML IV SOLN: 250.0000 [IU] | INTRAVENOUS | Status: DC | PRN

## 2015-01-25 MED ORDER — MIDAZOLAM HCL 5 MG/5ML IJ SOLN
INTRAMUSCULAR | Status: DC | PRN
  Administered 2015-01-25: 2 mg via INTRAVENOUS
  Administered 2015-01-25 (×2): 1 mg via INTRAVENOUS
  Administered 2015-01-25: 2 mg via INTRAVENOUS

## 2015-01-25 MED ORDER — FUROSEMIDE 10 MG/ML IJ SOLN
20.0000 mg | Freq: Once | INTRAMUSCULAR | Status: AC
  Administered 2015-01-25: 20 mg via INTRAVENOUS
  Filled 2015-01-25: qty 2

## 2015-01-25 MED ORDER — MIDAZOLAM HCL 10 MG/2ML IJ SOLN
INTRAMUSCULAR | Status: AC
Start: 2015-01-25 — End: 2015-01-25
  Filled 2015-01-25: qty 2

## 2015-01-25 MED ORDER — SODIUM CHLORIDE 0.9 % IJ SOLN: 3.0000 mL | INTRAMUSCULAR | Status: DC | PRN

## 2015-01-25 MED ORDER — HEPARIN SOD (PORK) LOCK FLUSH 100 UNIT/ML IV SOLN: 500.0000 [IU] | Freq: Every day | INTRAVENOUS | Status: DC | PRN

## 2015-01-25 MED ORDER — HEPARIN SOD (PORK) LOCK FLUSH 100 UNIT/ML IV SOLN
250.0000 [IU] | INTRAVENOUS | Status: DC | PRN
  Filled 2015-01-25: qty 3

## 2015-01-25 MED ORDER — SODIUM CHLORIDE 0.9 % IV SOLN: Freq: Once | INTRAVENOUS | Status: AC

## 2015-01-25 MED ORDER — ONDANSETRON HCL 4 MG PO TABS: 4.0000 mg | ORAL_TABLET | Freq: Four times a day (QID) | ORAL | Status: DC | PRN

## 2015-01-25 MED ORDER — ACETAMINOPHEN 650 MG RE SUPP: 650.0000 mg | Freq: Four times a day (QID) | RECTAL | Status: DC | PRN

## 2015-01-25 MED ORDER — SODIUM CHLORIDE 0.9 % IV SOLN
1000.0000 mL | INTRAVENOUS | Status: DC
  Administered 2015-01-25: 1000 mL via INTRAVENOUS

## 2015-01-25 MED ORDER — SODIUM CHLORIDE 0.9 % IJ SOLN: 10.0000 mL | INTRAMUSCULAR | Status: DC | PRN

## 2015-01-25 MED ORDER — SODIUM CHLORIDE 0.9 % IV SOLN
250.0000 mL | Freq: Once | INTRAVENOUS | Status: AC
  Administered 2015-01-25: 250 mL via INTRAVENOUS

## 2015-01-25 MED ORDER — IRBESARTAN 150 MG PO TABS
150.0000 mg | ORAL_TABLET | Freq: Every day | ORAL | Status: DC
  Administered 2015-01-25: 150 mg via ORAL
  Filled 2015-01-25 (×2): qty 1

## 2015-01-25 MED ORDER — FUROSEMIDE 80 MG PO TABS
80.0000 mg | ORAL_TABLET | Freq: Every day | ORAL | Status: DC
Start: 2015-01-26 — End: 2015-01-26
  Administered 2015-01-26: 80 mg via ORAL
  Filled 2015-01-25: qty 1

## 2015-01-25 MED ORDER — HEPARIN SOD (PORK) LOCK FLUSH 100 UNIT/ML IV SOLN
500.0000 [IU] | Freq: Every day | INTRAVENOUS | Status: DC | PRN
  Filled 2015-01-25: qty 5

## 2015-01-25 MED ORDER — DIPHENHYDRAMINE HCL 50 MG/ML IJ SOLN
INTRAMUSCULAR | Status: AC
  Filled 2015-01-25: qty 1

## 2015-01-25 MED ORDER — FUROSEMIDE 10 MG/ML IJ SOLN
20.0000 mg | Freq: Once | INTRAMUSCULAR | Status: AC
  Filled 2015-01-25: qty 2

## 2015-01-25 MED ORDER — SODIUM CHLORIDE 0.9 % IV SOLN: INTRAVENOUS | Status: DC

## 2015-01-25 MED ORDER — ALLOPURINOL 150 MG HALF TABLET
150.0000 mg | ORAL_TABLET | Freq: Every day | ORAL | Status: DC
Start: 2015-01-26 — End: 2015-01-26
  Administered 2015-01-26: 150 mg via ORAL
  Filled 2015-01-25: qty 1

## 2015-01-25 MED ORDER — FENTANYL CITRATE 0.05 MG/ML IJ SOLN
INTRAMUSCULAR | Status: DC | PRN
  Administered 2015-01-25 (×2): 25 ug via INTRAVENOUS

## 2015-01-25 MED ORDER — CLOPIDOGREL BISULFATE 75 MG PO TABS
75.0000 mg | ORAL_TABLET | Freq: Every day | ORAL | Status: DC
  Administered 2015-01-25 – 2015-01-26 (×2): 75 mg via ORAL
  Filled 2015-01-25 (×2): qty 1

## 2015-01-25 MED ORDER — FENTANYL CITRATE 0.05 MG/ML IJ SOLN
INTRAMUSCULAR | Status: AC
  Filled 2015-01-25: qty 2

## 2015-01-25 MED ORDER — NITROGLYCERIN 0.4 MG SL SUBL: 0.4000 mg | SUBLINGUAL_TABLET | SUBLINGUAL | Status: DC | PRN

## 2015-01-25 MED ORDER — POTASSIUM CHLORIDE IN NACL 20-0.9 MEQ/L-% IV SOLN
INTRAVENOUS | Status: DC
  Administered 2015-01-26: 01:00:00 via INTRAVENOUS
  Filled 2015-01-25 (×2): qty 1000

## 2015-01-25 NOTE — ED Notes (Signed)
Knapp MD at bedside  

## 2015-01-25 NOTE — H&P (Signed)
[facility] History and Physical   Primary Care Physician:  [name], MD   Reason for Procedure:   GI bleeding  Plan:    Colonoscopy, The risks and benefits as well as alternatives of endoscopic     procedure(s) have been discussed and reviewed. All questions answered. The     patient agrees to proceed.     Observation admission for the GI bleeding and anti-coagulated state.     HPI: James [name] is a 81 y.o. male with chronic recurrent GI bleeding. He has been transfusion and iron-dependent. We know he has portal vein thrombosis and some small non-bleeding esophageal varices. Repeated investigations have failed to show definitive source of bleeding. Recent repeat capsule endoscopy showed fresh blood in the terminal ileum. While prepping for colonoscopy he developed what sounds like melena and bright red blood per rectum. He is not in pain and not weaker than normal. His warfarin has bbeen held but INR still elevated (see below). Clopidogrel has been continued because of drug-eluting stent < 33 year old.   Past Medical History  Diagnosis Date  . Bradycardia     Hypertensive  . carotid bruit, right   . hypercholesterolemia     Trig 234/293;takes Simcor daily  . Obesity, morbid   . Vasculitis   . 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 Beryle Beams; Dr. Beryle Beams , normal platelets  . Dermatitis     legs  . Bleeding gums   . Chronic low back pain   . Myalgia and myositis   . Unspecified vitamin D deficiency   . Bladder diverticulum   . Primary osteoarthritis of both knees   . Nephrolithiasis   . History of elevated glucose   . Thrombophlebitis     right forearm  . History of BPH   . History of colonic polyps     [name]  . Epididymitis, left     S/P Epidimectomy  . Splenomegaly 05/15/09    abd U/S NML  . Hypertension, benign     takes Micardis daily  . Neuropathy     acute  . Bruises easily   . H/O hiatal  hernia   . GERD (gastroesophageal reflux disease)     hx of but doesn't take any meds now  . Urinary frequency   . Urinary urgency   . Nocturia   . Urinary leakage   . Abnormality of gait 03/19/2013  . DVT of lower limb, acute 11/19/2013    Peroneal & distal tibial left 11/01/13  . Polio 1941    "mild case"  . CHF (congestive heart failure)     "low grade" (06/22/2014)  . DVT (deep venous thrombosis) 10/2013    LLE  . Pneumonia, bacterial     RML resolved, spirometry, normal  . Pneumonia 1933  . History of blood transfusion 05/2014  . Anemia 05/2014    "related to Xarelto"  . Iron deficiency anemia 05/2014    "got iron infusion"  . Arthritis     "all over"  . Gout   . Malignant melanoma of ear     right  . Thrombophlebitis of superficial veins of right lower extremity 06/23/2014    Incidental finding doppler 05/30/14  . Shortness of breath   . Esophageal varices without mention of bleeding 08/08/2014  . Portal vein thrombosis     Past Surgical History  Procedure Laterality Date  . Cystoscopy w/ stone manipulation  1950's  . Fem cutaneous  nerve entrapment  1977    due to surgery (mild lat anesth of bilat legs)  . Limited pelvis/hip bone scan  04/99    negative  . Bone scan  04/00  . Cataract extraction w/ intraocular lens  implant, bilateral Bilateral ~ 2004  . Melanoma excision Right 06/04    ear; Wide local excision,,with flap  . Stress cardiolite  05/11/04    mild inf. ischemia, EF 71%  . Shoulder surgery Left 06/17/2005    "tore it all to pieces when I fell; not fractured"  . Bronchoscopy  09/08    RML collapse with chronic pneumonia, bx neg (Dr. Joya Gaskins)  . Cyst excision Right 03/07/09    middle finger, DIP Joint Mucoid (Dr. Fredna Dow)  . Eyelid & eyebrow lift  1996  . Cyst excision Right 08/15/09    middle finger, DIP Joint Mucoid Cyst (Dr. Fredna Dow)  . Appendectomy  1950  . Excision of mucoid tumor    . Colonoscopy    . Total knee arthroplasty  01/10/2012    Procedure:  TOTAL KNEE ARTHROPLASTY;  Surgeon: Augustin Schooling, MD;  Location: [place];  Service: Orthopedics;  Laterality: Left;  . Surgery scrotal / testicular Left ~ 2000    removal of mass, noncancerous  . Cardiac catheterization  05/23/04    mild plague-statin  . Coronary angioplasty with stent placement  06/22/2014    to proximal LAD  . Inguinal hernia repair Bilateral 1959  . Inguinal hernia repair Left 1970's  . Esophagogastroduodenoscopy N/A 08/08/2014    Procedure: ESOPHAGOGASTRODUODENOSCOPY (EGD);  Surgeon: Gatha Mayer, MD;  Location: [facility];  Service: Endoscopy;  Laterality: N/A;  . Colonoscopy N/A 08/08/2014    Procedure: COLONOSCOPY;  Surgeon: Gatha Mayer, MD;  Location: [place];  Service: Endoscopy;  Laterality: N/A;  . Flexible sigmoidoscopy N/A 09/21/2014    Procedure: FLEXIBLE SIGMOIDOSCOPY;  Surgeon: Jerene Bears, MD;  Location: [place];  Service: Gastroenterology;  Laterality: N/A;  . Givens capsule study N/A 09/21/2014    Procedure: GIVENS CAPSULE STUDY;  Surgeon: Jerene Bears, MD;  Location: [facility];  Service: Endoscopy;  Laterality: N/A;  . Left and right heart catheterization with coronary angiogram N/A 06/22/2014    Procedure: LEFT AND RIGHT HEART CATHETERIZATION WITH CORONARY ANGIOGRAM;  Surgeon: Larey Dresser, MD;  Location: [facility] CATH LAB;  Service: Cardiovascular;  Laterality: N/A;  . Percutaneous coronary stent intervention (pci-s)  06/22/2014    Procedure: PERCUTANEOUS CORONARY STENT INTERVENTION (PCI-S);  Surgeon: Larey Dresser, MD;  Location: [facility] CATH LAB;  Service: Cardiovascular;;    Prior to Admission medications   Medication Sig Start Date End Date Taking? Authorizing Provider  allopurinol (ZYLOPRIM) 300 MG tablet Take 0.5 tablets (150 mg total) by mouth every morning. 10/31/14  Yes Tonia Ghent, MD  Cholecalciferol (VITAMIN D3) 2000 UNITS capsule Take 2,000 Units by mouth every morning.    Yes Historical Provider, MD  clopidogrel (PLAVIX) 75 MG  tablet Take 75 mg by mouth every morning.    Yes Historical Provider, MD  furosemide (LASIX) 40 MG tablet Take 80 mg by mouth daily.  09/25/14  Yes Verlee Monte, MD  LIVALO 4 MG TABS TAKE 1 TABLET AT BEDTIME 10/03/14  Yes Jolaine Artist, MD  Misc Natural Products (OSTEO BI-FLEX JOINT SHIELD PO) Take 2 tablets by mouth every morning.    Yes Historical Provider, MD  Multiple Vitamin (MULTIVITAMIN) tablet Take 1 tablet by mouth every morning.    Yes Historical  Provider, MD  nitroGLYCERIN (NITROSTAT) 0.4 MG SL tablet Place 1 tablet (0.4 mg total) under the tongue every 5 (five) minutes as needed. For chest pain 07/07/14  Yes Larey Dresser, MD  OVER THE COUNTER MEDICATION Place 2 drops into both eyes 4 (four) times daily. Wash type eye drop-   Yes Historical Provider, MD  pantoprazole (PROTONIX) 40 MG tablet Take 40 mg by mouth at bedtime.   Yes Historical Provider, MD  Probiotic Product (PROBIOTIC & ACIDOPHILUS EX ST PO) Take 1 tablet by mouth every morning.    Yes Historical Provider, MD  Psyllium (METAMUCIL PO) Take 20 mLs by mouth every evening.   Yes Historical Provider, MD  ranitidine (ZANTAC) 150 MG tablet Take 150 mg by mouth at bedtime.   Yes Historical Provider, MD  spironolactone (ALDACTONE) 25 MG tablet Take 1 tablet (25 mg total) by mouth daily. Patient taking differently: Take 25 mg by mouth daily. Take extra 25 mg for weight gain and swelling 10/26/14  Yes Larey Dresser, MD  telmisartan (MICARDIS) 40 MG tablet Take 40 mg by mouth every morning.    Yes Historical Provider, MD  warfarin (COUMADIN) 5 MG tablet Take 5 mg by mouth daily.    Yes Historical Provider, MD    Current Facility-Administered Medications  Medication Dose Route Frequency Provider Last Rate Last Dose  . 0.9 %  sodium chloride infusion  1,000 mL Intravenous Continuous Dorie Rank, MD 125 mL/hr at 01/25/15 1049 1,000 mL at 01/25/15 1049  . 0.9 %  sodium chloride infusion   Intravenous Once Dorie Rank, MD         Allergies as of 01/25/2015 - Review Complete 01/25/2015  Allergen Reaction Noted  . Aleve [naproxen sodium] Other (See Comments) 07/29/2014  . Rofecoxib Other (See Comments)   . Sulfa antibiotics Other (See Comments) 06/16/2014  . Sulfamethoxazole-trimethoprim Other (See Comments) 06/09/2009  . Tramadol hcl Other (See Comments) 12/07/2008  . Neomycin-bacitracin zn-polymyx Rash 06/16/2014  . Neosporin [neomycin-polymyxin-gramicidin] Rash 12/31/2011  . Penicillins Swelling and Rash     Family History  Problem Relation Age of Onset  . Thyroid cancer Mother   . Diabetes Father   . Heart disease Father   . Stroke Brother     cerebral hemm  . Colon cancer Brother   . Anesthesia problems Neg Hx   . Hypotension Neg Hx   . Malignant hyperthermia Neg Hx   . Pseudochol deficiency Neg Hx   . Prostate cancer Neg Hx   . Heart attack Father     History   Social History  . Marital Status: Widowed    Spouse Name: N/A    Number of Children: 2  . Years of Education: college   Occupational History  . Retired     1996 [employer]   Social History Main Topics  . Smoking status: Former Smoker -- 2.00 packs/day for 20 years    Types: Cigarettes, Cigars    Quit date: 09/27/1967  . Smokeless tobacco: Never Used  . Alcohol Use: No  . Drug Use: No  . Sexual Activity: Not on file   Other Topics Concern  . Not on file   Social History Narrative   Widowed 2013 after 63+ years of marriage, Wife had CHF and multiple myeloma   [name], daughter in Sports coach, lives with patient   Patient does not get regular exercise   No caffeine   Retired from [employer] ([employer]), retired from [employer]     Review of Systems: Positive for as per HPI All other review of systems negative except as mentioned in the HPI.  Physical Exam: Vital signs in last 24 hours: Temp:  [97.9 F (36.6 C)-98.1 F (36.7 C)] 97.9 F (36.6 C) (02/03 1244) Pulse Rate:  [59-68] 68 (02/03 1244) Resp:   [18-22] 22 (02/03 1244) BP: (99-130)/(48-56) 130/53 mmHg (02/03 1244) SpO2:  [99 %-100 %] 100 % (02/03 1244)   General:   Alert,  Well-developed, well-nourished, pleasant and cooperative in NAD Lungs:  Clear throughout to auscultation.   Heart:  Regular rate and rhythm; no murmurs, clicks, rubs,  or gallops. Abdomen:  Soft, nontender and nondistended. Normal bowel sounds.   Neuro/Psych:  Alert and cooperative. Normal mood and affect. A and O x 3   @Wisdom Seybold  Simonne Maffucci, MD, [facility] [phone] (pager) 01/25/2015 1:15 PM@

## 2015-01-25 NOTE — Telephone Encounter (Signed)
Patient notified to go to ER.  Nevin Bloodgood is also updated on patient and plan

## 2015-01-25 NOTE — ED Notes (Signed)
Pt attempt to have BM at present time.

## 2015-01-25 NOTE — ED Notes (Signed)
Pt had bowel movement, gross blood notified knapp

## 2015-01-25 NOTE — ED Provider Notes (Signed)
CSN: [id number]     Arrival date & time 01/25/15  0955 History   First MD Initiated Contact with Patient 01/25/15 [phone]     Chief Complaint  Patient presents with  . Rectal Bleeding     HPI Pt has been having issues with anemia over the last year.  He has had an extensive GI workup including colonoscopies and endoscopies.  He had an abnormal pill cam finding and was scheduled for a colonoscopy today.  Pt was doing the bowel prep and noticed several clots of dark blood this morning. He.  He called the GI doctor on call today and was told to come to the ED.  he denies any trouble with any chest pain or shortness of breath. He denies any trouble with abdominal pain nausea or vomiting. He denies any bleeding elsewhere. He has not noticed any bruising. Past Medical History  Diagnosis Date  . Bradycardia     Hypertensive  . carotid bruit, right   . hypercholesterolemia     Trig 234/293;takes Simcor daily  . Obesity, morbid   . Vasculitis   . 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 Beryle Beams; Dr. Beryle Beams , normal platelets  . Dermatitis     legs  . Bleeding gums   . Chronic low back pain   . Myalgia and myositis   . Unspecified vitamin D deficiency   . Bladder diverticulum   . Primary osteoarthritis of both knees   . Nephrolithiasis   . History of elevated glucose   . Thrombophlebitis     right forearm  . History of BPH   . History of colonic polyps     [name]  . Epididymitis, left     S/P Epidimectomy  . Splenomegaly 05/15/09    abd U/S NML  . Hypertension, benign     takes Micardis daily  . Neuropathy     acute  . Bruises easily   . H/O hiatal hernia   . GERD (gastroesophageal reflux disease)     hx of but doesn't take any meds now  . Urinary frequency   . Urinary urgency   . Nocturia   . Urinary leakage   . Abnormality of gait 03/19/2013  . DVT of lower limb, acute 11/19/2013    Peroneal & distal tibial left 11/01/13  .  Polio 1941    "mild case"  . CHF (congestive heart failure)     "low grade" (06/22/2014)  . DVT (deep venous thrombosis) 10/2013    LLE  . Pneumonia, bacterial     RML resolved, spirometry, normal  . Pneumonia 1933  . History of blood transfusion 05/2014  . Anemia 05/2014    "related to Xarelto"  . Iron deficiency anemia 05/2014    "got iron infusion"  . Arthritis     "all over"  . Gout   . Malignant melanoma of ear     right  . Thrombophlebitis of superficial veins of right lower extremity 06/23/2014    Incidental finding doppler 05/30/14  . Shortness of breath   . Esophageal varices without mention of bleeding 08/08/2014  . Portal vein thrombosis    Past Surgical History  Procedure Laterality Date  . Cystoscopy w/ stone manipulation  1950's  . Fem cutaneous nerve entrapment  1977    due to surgery (mild lat anesth of bilat legs)  . Limited pelvis/hip bone scan  04/99    negative  . Bone scan  04/00  .  Cataract extraction w/ intraocular lens  implant, bilateral Bilateral ~ 2004  . Melanoma excision Right 06/04    ear; Wide local excision,,with flap  . Stress cardiolite  05/11/04    mild inf. ischemia, EF 71%  . Shoulder surgery Left 06/17/2005    "tore it all to pieces when I fell; not fractured"  . Bronchoscopy  09/08    RML collapse with chronic pneumonia, bx neg (Dr. Joya Gaskins)  . Cyst excision Right 03/07/09    middle finger, DIP Joint Mucoid (Dr. Fredna Dow)  . Eyelid & eyebrow lift  1996  . Cyst excision Right 08/15/09    middle finger, DIP Joint Mucoid Cyst (Dr. Fredna Dow)  . Appendectomy  1950  . Excision of mucoid tumor    . Colonoscopy    . Total knee arthroplasty  01/10/2012    Procedure: TOTAL KNEE ARTHROPLASTY;  Surgeon: Augustin Schooling, MD;  Location: [place];  Service: Orthopedics;  Laterality: Left;  . Surgery scrotal / testicular Left ~ 2000    removal of mass, noncancerous  . Cardiac catheterization  05/23/04    mild plague-statin  . Coronary angioplasty with stent  placement  06/22/2014    to proximal LAD  . Inguinal hernia repair Bilateral 1959  . Inguinal hernia repair Left 1970's  . Esophagogastroduodenoscopy N/A 08/08/2014    Procedure: ESOPHAGOGASTRODUODENOSCOPY (EGD);  Surgeon: Gatha Mayer, MD;  Location: [facility] ENDOSCOPY;  Service: Endoscopy;  Laterality: N/A;  . Colonoscopy N/A 08/08/2014    Procedure: COLONOSCOPY;  Surgeon: Gatha Mayer, MD;  Location: [place];  Service: Endoscopy;  Laterality: N/A;  . Flexible sigmoidoscopy N/A 09/21/2014    Procedure: FLEXIBLE SIGMOIDOSCOPY;  Surgeon: Jerene Bears, MD;  Location: [place];  Service: Gastroenterology;  Laterality: N/A;  . Givens capsule study N/A 09/21/2014    Procedure: GIVENS CAPSULE STUDY;  Surgeon: Jerene Bears, MD;  Location: [facility] ENDOSCOPY;  Service: Endoscopy;  Laterality: N/A;  . Left and right heart catheterization with coronary angiogram N/A 06/22/2014    Procedure: LEFT AND RIGHT HEART CATHETERIZATION WITH CORONARY ANGIOGRAM;  Surgeon: Larey Dresser, MD;  Location: [facility] CATH LAB;  Service: Cardiovascular;  Laterality: N/A;  . Percutaneous coronary stent intervention (pci-s)  06/22/2014    Procedure: PERCUTANEOUS CORONARY STENT INTERVENTION (PCI-S);  Surgeon: Larey Dresser, MD;  Location: [facility] CATH LAB;  Service: Cardiovascular;;   Family History  Problem Relation Age of Onset  . Thyroid cancer Mother   . Diabetes Father   . Heart disease Father   . Stroke Brother     cerebral hemm  . Colon cancer Brother   . Anesthesia problems Neg Hx   . Hypotension Neg Hx   . Malignant hyperthermia Neg Hx   . Pseudochol deficiency Neg Hx   . Prostate cancer Neg Hx   . Heart attack Father    History  Substance Use Topics  . Smoking status: Former Smoker -- 2.00 packs/day for 20 years    Types: Cigarettes, Cigars    Quit date: 09/27/1967  . Smokeless tobacco: Never Used  . Alcohol Use: No    Review of Systems  All other systems reviewed and are negative.     Allergies  Aleve;  Rofecoxib; Sulfa antibiotics; Sulfamethoxazole-trimethoprim; Tramadol hcl; Neomycin-bacitracin zn-polymyx; Neosporin; and Penicillins  Home Medications   Prior to Admission medications   Medication Sig Start Date End Date Taking? Authorizing Provider  allopurinol (ZYLOPRIM) 300 MG tablet Take 0.5 tablets (150 mg total) by mouth every morning. 10/31/14  Tonia Ghent, MD  Cholecalciferol (VITAMIN D3) 2000 UNITS capsule Take 2,000 Units by mouth every morning.     Historical Provider, MD  clopidogrel (PLAVIX) 75 MG tablet Take 75 mg by mouth every morning.     Historical Provider, MD  furosemide (LASIX) 40 MG tablet Take 80 mg by mouth daily.  09/25/14   Verlee Monte, MD  LIVALO 4 MG TABS TAKE 1 TABLET AT BEDTIME 10/03/14   Jolaine Artist, MD  Misc Natural Products (OSTEO BI-FLEX JOINT SHIELD PO) Take 1 tablet by mouth every morning.     Historical Provider, MD  Multiple Vitamin (MULTIVITAMIN) tablet Take 1 tablet by mouth every morning.     Historical Provider, MD  nitroGLYCERIN (NITROSTAT) 0.4 MG SL tablet Place 1 tablet (0.4 mg total) under the tongue every 5 (five) minutes as needed. For chest pain 07/07/14   Larey Dresser, MD  pantoprazole (PROTONIX) 40 MG tablet Take 40 mg by mouth at bedtime.    Historical Provider, MD  Probiotic Product (PROBIOTIC & ACIDOPHILUS EX ST PO) Take 1 tablet by mouth every morning.     Historical Provider, MD  Psyllium (METAMUCIL PO) Take 20 mLs by mouth every evening.    Historical Provider, MD  ranitidine (ZANTAC) 150 MG tablet Take 150 mg by mouth at bedtime.    Historical Provider, MD  spironolactone (ALDACTONE) 25 MG tablet Take 1 tablet (25 mg total) by mouth daily. Patient taking differently: Take 25 mg by mouth daily. Take extra 25 mg for weight gain and swelling 10/26/14   Larey Dresser, MD  telmisartan (MICARDIS) 40 MG tablet Take 40 mg by mouth every morning.     Historical Provider, MD  warfarin (COUMADIN) 5 MG tablet Take 5 mg by mouth daily.      Historical Provider, MD   BP 124/56 mmHg  Pulse 67  Temp(Src) 98.1 F (36.7 C) (Oral)  Resp 18  SpO2 99% Physical Exam  Constitutional: He appears well-developed and well-nourished. No distress.  HENT:  Head: Normocephalic and atraumatic.  Right Ear: External ear normal.  Left Ear: External ear normal.  Eyes: Conjunctivae are normal. Right eye exhibits no discharge. Left eye exhibits no discharge. No scleral icterus.  Neck: Neck supple. No tracheal deviation present.  Cardiovascular: Normal rate, regular rhythm and intact distal pulses.   Pulmonary/Chest: Effort normal and breath sounds normal. No stridor. No respiratory distress. He has no wheezes. He has no rales.  Abdominal: Soft. Bowel sounds are normal. He exhibits no distension. There is no tenderness. There is no rebound and no guarding.  Genitourinary:  Patient has to use the restroom, wall defer a rectal exam right now.  Pt had gross blood in the commode  Musculoskeletal: He exhibits no edema or tenderness.  Neurological: He is alert. He has normal strength. No cranial nerve deficit (no facial droop, extraocular movements intact, no slurred speech) or sensory deficit. He exhibits normal muscle tone. He displays no seizure activity. Coordination normal.  Skin: Skin is warm and dry. No rash noted.  Psychiatric: He has a normal mood and affect.  Nursing note and vitals reviewed.   ED Course  Procedures (including critical care time) Labs Review Labs Reviewed  CBC WITH DIFFERENTIAL/PLATELET - Abnormal; Notable for the following:    RBC 3.24 (*)    Hemoglobin 8.6 (*)    HCT 27.6 (*)    RDW 18.6 (*)    All other components within normal limits  COMPREHENSIVE METABOLIC PANEL -  Abnormal; Notable for the following:    Sodium 133 (*)    Glucose, Bld 124 (*)    BUN 24 (*)    Creatinine, Ser 1.39 (*)    GFR calc non Af Amer 45 (*)    GFR calc Af Amer 52 (*)    All other components within normal limits  APTT - Abnormal;  Notable for the following:    aPTT 59 (*)    All other components within normal limits  PROTIME-INR - Abnormal; Notable for the following:    Prothrombin Time 31.2 (*)    INR 2.98 (*)    All other components within normal limits  TYPE AND SCREEN    Imaging Review No results found.   MDM   Final diagnoses:  Acute GI bleeding   Patient has history of recurrent gastrointestinal bleeding.  Patient appears hemodynamically stable. He is having gross blood from the rectum.   We'll plan on checking labs and contact gastroenterology. The patient most likely will need to be admitted for observation   HGB is 8.6.  Up from 7.6 last month but he has had blood transfusions in the interim.  Will continue to monitor closely.  Does not need blood emergently at this time.  Will consult with Dr Carlean Purl.  Anticipate he will need to be admitted and have the procedure performed.  D/w Dr Carlean Purl and [facility] Sheriff GI.  They will come see the patient in the ED.   Plan on doing the procedure today still.    Dorie Rank, MD 01/25/15 1153

## 2015-01-25 NOTE — Discharge Instructions (Signed)
Colonoscopy, Care After °These instructions give you information on caring for yourself after your procedure. Your doctor may also give you more specific instructions. Call your doctor if you have any problems or questions after your procedure. °HOME CARE °· Do not drive for 24 hours. °· Do not sign important papers or use machinery for 24 hours. °· You may shower. °· You may go back to your usual activities, but go slower for the first 24 hours. °· Take rest breaks often during the first 24 hours. °· Walk around or use warm packs on your belly (abdomen) if you have belly cramping or gas. °· Drink enough fluids to keep your pee (urine) clear or pale yellow. °· Resume your normal diet. Avoid heavy or fried foods. °· Avoid drinking alcohol for 24 hours or as told by your doctor. °· Only take medicines as told by your doctor. °If a tissue sample (biopsy) was taken during the procedure:  °· Do not take aspirin or blood thinners for 7 days, or as told by your doctor. °· Do not drink alcohol for 7 days, or as told by your doctor. °· Eat soft foods for the first 24 hours. °GET HELP IF: °You still have a small amount of blood in your poop (stool) 2-3 days after the procedure. °GET HELP RIGHT AWAY IF: °· You have more than a small amount of blood in your poop. °· You see clumps of tissue (blood clots) in your poop. °· Your belly is puffy (swollen). °· You feel sick to your stomach (nauseous) or throw up (vomit). °· You have a fever. °· You have belly pain that gets worse and medicine does not help. °MAKE SURE YOU: °· Understand these instructions. °· Will watch your condition. °· Will get help right away if you are not doing well or get worse. °Document Released: 01/11/2011 Document Revised: 12/14/2013 Document Reviewed: 08/16/2013 °Exit[employer]® Patient Information ©2015 [employer]. This information is not intended to replace advice given to you by your health care provider. Make sure you discuss any questions you have with  your health care provider. ° °

## 2015-01-25 NOTE — Telephone Encounter (Signed)
Patient reports that he has had a lot of bright red bleeding with clots while prepping last night. He reports that he has taken one glass of the other half of the prep to take this am and is still passing blood.  He reports that there is no solid stool and hasn't been since last evening.  Should he take the other half of the prep this am for his colonoscopy today?

## 2015-01-25 NOTE — ED Notes (Signed)
Pt transported to endo at present time.

## 2015-01-25 NOTE — ED Notes (Signed)
Pt currently in ENDO. Per Lisa/secretary bed placement report pt going to 1537. Report given to [name] on floor and aware endo will transfer to 1537 in approximately 30 minutes. Dan from ENDO reports approximately 30 minutes prior to complete of procedure/time of transfer to the floor.

## 2015-01-25 NOTE — ED Notes (Addendum)
Pt c/o increased rectal bleeding while completing bowel prep for scheduled colonoscopy.  Denies pain.  Pt reports extensive GI Hx and anemia.  Sts "there were several blood clots and dark red blood."  Pt notified GI MD and was directed to come to ED.

## 2015-01-25 NOTE — Op Note (Signed)
[facility] [place], [address]   COLONOSCOPY PROCEDURE REPORT  PATIENT: James [name]  MR#: [id number] BIRTHDATE: [birth year] , 86  yrs. old GENDER: male ENDOSCOPIST: Gatha Mayer, MD, [facility] PROCEDURE DATE:  01/25/2015 PROCEDURE:   Colonoscopy, diagnostic First Screening Colonoscopy - Avg.  risk and is 50 yrs.  old or older - No.  Prior Negative Screening - Now for repeat screening. N/A  History of Adenoma - Now for follow-up colonoscopy & has been > or = to 3 yrs.  N/A  Polyps Removed Today? No.  Polyps Removed Today? No.  Recommend repeat exam, <10 yrs? Polyps Removed Today? No.  Recommend repeat exam, <10 yrs? No. ASA CLASS:   Class III INDICATIONS:evaluate chronic blood loss anemia - had hematochezia with prep so acutely bleeding today.   INR 2.9 and still on clopidogrel MEDICATIONS: Fentanyl 50 mcg IV and Versed 6 mg IV  DESCRIPTION OF PROCEDURE:   After the risks benefits and alternatives of the procedure were thoroughly explained, informed consent was obtained.  The digital rectal exam revealed no abnormalities of the rectum.   The Pentax Adult Colonscope [id number] endoscope was introduced through the anus and advanced to the terminal ileum which was intubated for a short distance. No adverse events experienced.   The quality of the prep was adequate, using MiraLax  The instrument was then slowly withdrawn as the colon was fully examined.      COLON FINDINGS: 1) Small amount of red blood seen in distal colon with background of some brown residual stool - no active bleeding. No other blood seen in colon. 2) Normal terminal ielum inspected about 30 cm in. 3) A few areas of trauma in colon - red areas with slight heme - convinced scope trauma caused these 4) Two small polyps - 1 cecum and 1 ascending colon/IC valve area as before. Not removed or biopsied.  Some areas of mucosa raise ? of portal colapathy with vascular congestion in  mucosa.Rectal varix suspected - no stigmata of bleeding. The time to cecum=5 minutes 0 seconds. Withdrawal time=19 minutes 0 seconds.  The scope was withdrawn and the procedure completed. COMPLICATIONS: There were no immediate complications.  ENDOSCOPIC IMPRESSION: 1) Small amount of red blood seen in distal colon with background of some brown residual stool - no active bleeding.  No other blood seen in colon. 2) Normal terminal ielum inspected about 30 cm in. 3) A few areas of trauma in colon - red areas with slight heme - convinced scope trauma caused these 4) Two small polyps - 1 cecum and 1 ascending colon/IC valve area as before.  Not removed or biopsied 5)  rectal varix and portal colopathy suspected.    RECOMMENDATIONS: I suspect he has low-grade bleeding from small areas of trauma and possibly from portal hypertension changes.  Acute bleeding with prep is of uncertain etiology but appears to have been from the rectum or sigmoid and may be related to a rectal varix or colopathy or both - suspect latter.  At this point I cannot see pursuing more GI evaluation as we have not found a treatable lesion.  I think it may be time to stop clopidogrel or warfarin - would need to decided which is least risk - vs.  continuing supportive care on the 2 until 1 year up from drug-eluting stent placement.  The colon polyps are a non-issue I believe.  eSigned:  Gatha Mayer, MD, [facility] 01/25/2015 2:34 PM  cc: Murriel Hopper, MD, Elveria Rising. Damita Dunnings, MD and Loralie Champagne, MD   PATIENT NAME:  James [name], James [name] MR#: [id number]

## 2015-01-25 NOTE — Telephone Encounter (Signed)
As discussed head to ED please Notify Nevin Bloodgood

## 2015-01-26 ENCOUNTER — Telehealth (HOSPITAL_COMMUNITY): Payer: Self-pay | Admitting: Cardiology

## 2015-01-26 ENCOUNTER — Other Ambulatory Visit: Payer: Self-pay | Admitting: Oncology

## 2015-01-26 ENCOUNTER — Encounter: Payer: Self-pay | Admitting: Oncology

## 2015-01-26 ENCOUNTER — Encounter (HOSPITAL_COMMUNITY): Payer: Self-pay | Admitting: Internal Medicine

## 2015-01-26 DIAGNOSIS — Z86718 Personal history of other venous thrombosis and embolism: Secondary | ICD-10-CM

## 2015-01-26 DIAGNOSIS — K921 Melena: Secondary | ICD-10-CM | POA: Diagnosis not present

## 2015-01-26 DIAGNOSIS — Z7901 Long term (current) use of anticoagulants: Secondary | ICD-10-CM

## 2015-01-26 DIAGNOSIS — I81 Portal vein thrombosis: Secondary | ICD-10-CM

## 2015-01-26 LAB — CBC WITH DIFFERENTIAL/PLATELET
BASOS ABS: 0 10*3/uL (ref 0.0–0.1)
Basophils Relative: 1 % (ref 0–1)
EOS PCT: 2 % (ref 0–5)
Eosinophils Absolute: 0.1 10*3/uL (ref 0.0–0.7)
HCT: 27.8 % — ABNORMAL LOW (ref 39.0–52.0)
Hemoglobin: 8.8 g/dL — ABNORMAL LOW (ref 13.0–17.0)
LYMPHS ABS: 1 10*3/uL (ref 0.7–4.0)
Lymphocytes Relative: 19 % (ref 12–46)
MCH: 26.7 pg (ref 26.0–34.0)
MCHC: 31.7 g/dL (ref 30.0–36.0)
MCV: 84.5 fL (ref 78.0–100.0)
Monocytes Absolute: 0.6 10*3/uL (ref 0.1–1.0)
Monocytes Relative: 12 % (ref 3–12)
Neutro Abs: 3.4 10*3/uL (ref 1.7–7.7)
Neutrophils Relative %: 66 % (ref 43–77)
Platelets: 305 10*3/uL (ref 150–400)
RBC: 3.29 MIL/uL — ABNORMAL LOW (ref 4.22–5.81)
RDW: 18.8 % — ABNORMAL HIGH (ref 11.5–15.5)
WBC: 5.2 10*3/uL (ref 4.0–10.5)

## 2015-01-26 LAB — APTT: APTT: 63 s — AB (ref 24–37)

## 2015-01-26 LAB — PROTIME-INR
INR: 3.3 — ABNORMAL HIGH (ref 0.00–1.49)
PROTHROMBIN TIME: 33.8 s — AB (ref 11.6–15.2)

## 2015-01-26 NOTE — Telephone Encounter (Signed)
Pt aware and will discontinue ASAP

## 2015-01-26 NOTE — Progress Notes (Signed)
UR completed 

## 2015-01-26 NOTE — Progress Notes (Unsigned)
Patient ID: KIREE DEJARNETTE, male   DOB: 23-Dec-1928, 81 y.o.   MRN: [id number] The patient had a colonoscopy today. There was a suspicion from repeat capsule endoscopy that there might be a focal point of bleeding at the ileocolic junction. This was not confirmed at time of the procedure. Dr. Carlean Purl  called me to report results and discuss the patient's lab results. We have been trying to keep his INR close to 2 in view of concern about ongoing GI bleeding. When he came in for lab on February 1, INR was above therapeutic range at 3.4. He was told to hold the Coumadin in anticipation of today's procedure. Repeat INR yesterday was 2.98. INR has continued to rise and was 3.3 today. APTT was checked and is also elevated at 63. This is not surprising since Coumadin also inhibits factors 2, 9, and 10. What is unusual is that his INR should be remaining elevated off Coumadin now for 4 days. I advised Dr. Carlean Purl to tell the patient to stay off Coumadin for the next 72 hours. We will repeat a PT/INR in the clinic on Monday, February 8.

## 2015-01-26 NOTE — Telephone Encounter (Signed)
-----   Message from Larey Dresser, MD sent at 01/26/2015  1:01 PM EST ----- Regarding: RE: ? time to stop clopidogrel (or warfarin) Reasonable data to stop Plavix at 6 months with new generation DES who are having problems with GI bleeding, etc.  It has been 6 months post-stent, so let's have him stop Plavix and leave warfarin for now.   I'll have my office call him to stop the Plavix but continue warfarin.   Dalton ----- Message -----    From: Gatha Mayer, MD    Sent: 01/26/2015  12:28 PM      To: Larey Dresser, MD, Tonia Ghent, MD, # Subject: ? time to stop clopidogrel (or warfarin)       Dalton,  Cannot find a bleeding source in him though suspect portal htn is main issue with leaking on 2 'blood thinners".  I think we have 2 options at this time - and I discussed w/ Clair Gulling also.  1) Support as we have and stop clopidogrel at 1 year 2) Stop clopidogrel now (or ? Warfarin)  I cannot tell what is worst choice - though doubt he will bleed to death w/ # 1 but quality of life suffering he says.  Daughter is reading about stopping clopidogrel sooner than 1 year is done by some.  Sorry I could not fix him so to speak.  carl

## 2015-01-27 ENCOUNTER — Telehealth (HOSPITAL_COMMUNITY): Payer: Self-pay | Admitting: Vascular Surgery

## 2015-01-27 NOTE — Telephone Encounter (Signed)
Attempted call back; no answer

## 2015-01-27 NOTE — Telephone Encounter (Signed)
Pt daughter called she needs to speak to a nurse about pt medication instructions that Nira Conn called to him yesterday and she is concerned with pt bp she would like him to be seen on Monday.. Please advise

## 2015-01-29 ENCOUNTER — Encounter (HOSPITAL_COMMUNITY): Payer: Self-pay | Admitting: Emergency Medicine

## 2015-01-29 ENCOUNTER — Emergency Department (HOSPITAL_COMMUNITY)
Admission: EM | Admit: 2015-01-29 | Discharge: 2015-01-29 | Disposition: A | Discharge status: Home / Self Care | Payer: Medicare Other | Attending: Emergency Medicine | Admitting: Emergency Medicine

## 2015-01-29 DIAGNOSIS — Z8701 Personal history of pneumonia (recurrent): Secondary | ICD-10-CM | POA: Insufficient documentation

## 2015-01-29 DIAGNOSIS — Z86010 Personal history of colonic polyps: Secondary | ICD-10-CM | POA: Diagnosis not present

## 2015-01-29 DIAGNOSIS — M109 Gout, unspecified: Secondary | ICD-10-CM | POA: Insufficient documentation

## 2015-01-29 DIAGNOSIS — Z86718 Personal history of other venous thrombosis and embolism: Secondary | ICD-10-CM | POA: Diagnosis not present

## 2015-01-29 DIAGNOSIS — G8929 Other chronic pain: Secondary | ICD-10-CM | POA: Insufficient documentation

## 2015-01-29 DIAGNOSIS — Z87891 Personal history of nicotine dependence: Secondary | ICD-10-CM | POA: Diagnosis not present

## 2015-01-29 DIAGNOSIS — Z87448 Personal history of other diseases of urinary system: Secondary | ICD-10-CM | POA: Insufficient documentation

## 2015-01-29 DIAGNOSIS — Z9889 Other specified postprocedural states: Secondary | ICD-10-CM | POA: Diagnosis not present

## 2015-01-29 DIAGNOSIS — K219 Gastro-esophageal reflux disease without esophagitis: Secondary | ICD-10-CM | POA: Insufficient documentation

## 2015-01-29 DIAGNOSIS — R319 Hematuria, unspecified: Secondary | ICD-10-CM | POA: Insufficient documentation

## 2015-01-29 DIAGNOSIS — Z79899 Other long term (current) drug therapy: Secondary | ICD-10-CM | POA: Diagnosis not present

## 2015-01-29 DIAGNOSIS — Z87442 Personal history of urinary calculi: Secondary | ICD-10-CM | POA: Insufficient documentation

## 2015-01-29 DIAGNOSIS — Z880 Allergy status to penicillin: Secondary | ICD-10-CM | POA: Diagnosis not present

## 2015-01-29 DIAGNOSIS — Z85820 Personal history of malignant melanoma of skin: Secondary | ICD-10-CM | POA: Insufficient documentation

## 2015-01-29 DIAGNOSIS — Z862 Personal history of diseases of the blood and blood-forming organs and certain disorders involving the immune mechanism: Secondary | ICD-10-CM | POA: Insufficient documentation

## 2015-01-29 DIAGNOSIS — I10 Essential (primary) hypertension: Secondary | ICD-10-CM | POA: Diagnosis not present

## 2015-01-29 DIAGNOSIS — Z87438 Personal history of other diseases of male genital organs: Secondary | ICD-10-CM | POA: Diagnosis not present

## 2015-01-29 DIAGNOSIS — I509 Heart failure, unspecified: Secondary | ICD-10-CM | POA: Diagnosis not present

## 2015-01-29 LAB — I-STAT CHEM 8, ED
BUN: 23 mg/dL (ref 6–23)
CREATININE: 1.3 mg/dL (ref 0.50–1.35)
Calcium, Ion: 1.12 mmol/L — ABNORMAL LOW (ref 1.13–1.30)
Chloride: 100 mmol/L (ref 96–112)
GLUCOSE: 121 mg/dL — AB (ref 70–99)
HCT: 33 % — ABNORMAL LOW (ref 39.0–52.0)
Hemoglobin: 11.2 g/dL — ABNORMAL LOW (ref 13.0–17.0)
Potassium: 4.3 mmol/L (ref 3.5–5.1)
Sodium: 136 mmol/L (ref 135–145)
TCO2: 20 mmol/L (ref 0–100)

## 2015-01-29 LAB — PROTIME-INR
INR: 1.39 (ref 0.00–1.49)
Prothrombin Time: 17.2 seconds — ABNORMAL HIGH (ref 11.6–15.2)

## 2015-01-29 LAB — URINALYSIS, ROUTINE W REFLEX MICROSCOPIC
BILIRUBIN URINE: NEGATIVE
Glucose, UA: NEGATIVE mg/dL
KETONES UR: NEGATIVE mg/dL
Nitrite: NEGATIVE
Protein, ur: NEGATIVE mg/dL
Specific Gravity, Urine: 1.009 (ref 1.005–1.030)
UROBILINOGEN UA: 0.2 mg/dL (ref 0.0–1.0)
pH: 5 (ref 5.0–8.0)

## 2015-01-29 LAB — CBC WITH DIFFERENTIAL/PLATELET
Basophils Absolute: 0.1 10*3/uL (ref 0.0–0.1)
Basophils Relative: 1 % (ref 0–1)
EOS ABS: 0.2 10*3/uL (ref 0.0–0.7)
Eosinophils Relative: 2 % (ref 0–5)
HEMATOCRIT: 31.7 % — AB (ref 39.0–52.0)
HEMOGLOBIN: 9.8 g/dL — AB (ref 13.0–17.0)
Lymphocytes Relative: 18 % (ref 12–46)
Lymphs Abs: 1.6 10*3/uL (ref 0.7–4.0)
MCH: 27 pg (ref 26.0–34.0)
MCHC: 30.9 g/dL (ref 30.0–36.0)
MCV: 87.3 fL (ref 78.0–100.0)
MONO ABS: 0.9 10*3/uL (ref 0.1–1.0)
MONOS PCT: 10 % (ref 3–12)
NEUTROS ABS: 6.1 10*3/uL (ref 1.7–7.7)
Neutrophils Relative %: 69 % (ref 43–77)
PLATELETS: 375 10*3/uL (ref 150–400)
RBC: 3.63 MIL/uL — ABNORMAL LOW (ref 4.22–5.81)
RDW: 19 % — ABNORMAL HIGH (ref 11.5–15.5)
WBC: 8.9 10*3/uL (ref 4.0–10.5)

## 2015-01-29 LAB — URINE MICROSCOPIC-ADD ON

## 2015-01-29 NOTE — ED Provider Notes (Signed)
CSN: [id number]     Arrival date & time 01/29/15  1504 History   First MD Initiated Contact with Patient 01/29/15 1519     Chief Complaint  Patient presents with  . Hematuria     (Consider location/radiation/quality/duration/timing/severity/associated sxs/prior Treatment) HPI Comments: Patient with past medical history of ITP, bladder diverticulum, kidney stones presents to the emergency department with chief complaint of hematuria. Patient states he noticed the symptoms last night. He reports passing several large clots in his urine. He was recently admitted to the hospital for GI bleed. He also recently had a cystoscopy by [facility], which had a "suspicious finding." Patient plans to follow-up with [facility] for another cystoscopy with biopsy. Originally, a biopsy was not performed because patient was on blood thinners. However, he recently had these discontinued by his gastroenterologist because of his GI bleed. Patient reports minimal dysuria. Denies any flank pain. Denies any fevers, chills, chest pain, shortness of breath, or abdominal pain. States that in the past couple of days he has felt better than he has in several months. However, when he saw the hematuria he became concerned and wanted to be evaluated.  The history is provided by the patient. No language interpreter was used.    Past Medical History  Diagnosis Date  . Bradycardia     Hypertensive  . carotid bruit, right   . hypercholesterolemia     Trig 234/293;takes Simcor daily  . Obesity, morbid   . Vasculitis   . 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 Beryle Beams; Dr. Beryle Beams , normal platelets  . Dermatitis     legs  . Bleeding gums   . Chronic low back pain   . Myalgia and myositis   . Unspecified vitamin D deficiency   . Bladder diverticulum   . Primary osteoarthritis of both knees   . Nephrolithiasis   . History of elevated glucose   . Thrombophlebitis      right forearm  . History of BPH   . History of colonic polyps     [name]  . Epididymitis, left     S/P Epidimectomy  . Splenomegaly 05/15/09    abd U/S NML  . Hypertension, benign     takes Micardis daily  . Neuropathy     acute  . Bruises easily   . H/O hiatal hernia   . GERD (gastroesophageal reflux disease)     hx of but doesn't take any meds now  . Urinary frequency   . Urinary urgency   . Nocturia   . Urinary leakage   . Abnormality of gait 03/19/2013  . DVT of lower limb, acute 11/19/2013    Peroneal & distal tibial left 11/01/13  . Polio 1941    "mild case"  . CHF (congestive heart failure)     "low grade" (06/22/2014)  . DVT (deep venous thrombosis) 10/2013    LLE  . Pneumonia, bacterial     RML resolved, spirometry, normal  . Pneumonia 1933  . History of blood transfusion 05/2014  . Anemia 05/2014    "related to Xarelto"  . Iron deficiency anemia 05/2014    "got iron infusion"  . Arthritis     "all over"  . Gout   . Malignant melanoma of ear     right  . Thrombophlebitis of superficial veins of right lower extremity 06/23/2014    Incidental finding doppler 05/30/14  . Shortness of breath   . Esophageal varices without  mention of bleeding 08/08/2014  . Portal vein thrombosis    Past Surgical History  Procedure Laterality Date  . Cystoscopy w/ stone manipulation  1950's  . Fem cutaneous nerve entrapment  1977    due to surgery (mild lat anesth of bilat legs)  . Limited pelvis/hip bone scan  04/99    negative  . Bone scan  04/00  . Cataract extraction w/ intraocular lens  implant, bilateral Bilateral ~ 2004  . Melanoma excision Right 06/04    ear; Wide local excision,,with flap  . Stress cardiolite  05/11/04    mild inf. ischemia, EF 71%  . Shoulder surgery Left 06/17/2005    "tore it all to pieces when I fell; not fractured"  . Bronchoscopy  09/08    RML collapse with chronic pneumonia, bx neg (Dr. Joya Gaskins)  . Cyst excision Right 03/07/09    middle  finger, DIP Joint Mucoid (Dr. Fredna Dow)  . Eyelid & eyebrow lift  1996  . Cyst excision Right 08/15/09    middle finger, DIP Joint Mucoid Cyst (Dr. Fredna Dow)  . Appendectomy  1950  . Excision of mucoid tumor    . Colonoscopy    . Total knee arthroplasty  01/10/2012    Procedure: TOTAL KNEE ARTHROPLASTY;  Surgeon: Augustin Schooling, MD;  Location: [place];  Service: Orthopedics;  Laterality: Left;  . Surgery scrotal / testicular Left ~ 2000    removal of mass, noncancerous  . Cardiac catheterization  05/23/04    mild plague-statin  . Coronary angioplasty with stent placement  06/22/2014    to proximal LAD  . Inguinal hernia repair Bilateral 1959  . Inguinal hernia repair Left 1970's  . Esophagogastroduodenoscopy N/A 08/08/2014    Procedure: ESOPHAGOGASTRODUODENOSCOPY (EGD);  Surgeon: Gatha Mayer, MD;  Location: [facility] ENDOSCOPY;  Service: Endoscopy;  Laterality: N/A;  . Colonoscopy N/A 08/08/2014    Procedure: COLONOSCOPY;  Surgeon: Gatha Mayer, MD;  Location: [place];  Service: Endoscopy;  Laterality: N/A;  . Flexible sigmoidoscopy N/A 09/21/2014    Procedure: FLEXIBLE SIGMOIDOSCOPY;  Surgeon: Jerene Bears, MD;  Location: [place];  Service: Gastroenterology;  Laterality: N/A;  . Givens capsule study N/A 09/21/2014    Procedure: GIVENS CAPSULE STUDY;  Surgeon: Jerene Bears, MD;  Location: [facility] ENDOSCOPY;  Service: Endoscopy;  Laterality: N/A;  . Left and right heart catheterization with coronary angiogram N/A 06/22/2014    Procedure: LEFT AND RIGHT HEART CATHETERIZATION WITH CORONARY ANGIOGRAM;  Surgeon: Larey Dresser, MD;  Location: [facility] CATH LAB;  Service: Cardiovascular;  Laterality: N/A;  . Percutaneous coronary stent intervention (pci-s)  06/22/2014    Procedure: PERCUTANEOUS CORONARY STENT INTERVENTION (PCI-S);  Surgeon: Larey Dresser, MD;  Location: [facility] CATH LAB;  Service: Cardiovascular;;  . Colonoscopy N/A 01/25/2015    Procedure: COLONOSCOPY;  Surgeon: Gatha Mayer, MD;  Location: [facility]  ENDOSCOPY;  Service: Endoscopy;  Laterality: N/A;   Family History  Problem Relation Age of Onset  . Thyroid cancer Mother   . Diabetes Father   . Heart disease Father   . Stroke Brother     cerebral hemm  . Colon cancer Brother   . Anesthesia problems Neg Hx   . Hypotension Neg Hx   . Malignant hyperthermia Neg Hx   . Pseudochol deficiency Neg Hx   . Prostate cancer Neg Hx   . Heart attack Father    History  Substance Use Topics  . Smoking status: Former Smoker -- 2.00 packs/day for 20  years    Types: Cigarettes, Cigars    Quit date: 09/27/1967  . Smokeless tobacco: Never Used  . Alcohol Use: No    Review of Systems  Constitutional: Negative for fever and chills.  Respiratory: Negative for shortness of breath.   Cardiovascular: Negative for chest pain.  Gastrointestinal: Negative for nausea, vomiting, diarrhea and constipation.  Genitourinary: Positive for hematuria. Negative for dysuria.      Allergies  Aleve; Rofecoxib; Sulfa antibiotics; Sulfamethoxazole-trimethoprim; Tramadol hcl; Neomycin-bacitracin zn-polymyx; Neosporin; and Penicillins  Home Medications   Prior to Admission medications   Medication Sig Start Date End Date Taking? Authorizing Provider  allopurinol (ZYLOPRIM) 300 MG tablet Take 0.5 tablets (150 mg total) by mouth every morning. 10/31/14   Tonia Ghent, MD  Cholecalciferol (VITAMIN D3) 2000 UNITS capsule Take 2,000 Units by mouth every morning.     Historical Provider, MD  furosemide (LASIX) 40 MG tablet Take 80 mg by mouth daily.  09/25/14   Verlee Monte, MD  LIVALO 4 MG TABS TAKE 1 TABLET AT BEDTIME 10/03/14   Jolaine Artist, MD  Misc Natural Products (OSTEO BI-FLEX JOINT SHIELD PO) Take 2 tablets by mouth every morning.     Historical Provider, MD  nitroGLYCERIN (NITROSTAT) 0.4 MG SL tablet Place 1 tablet (0.4 mg total) under the tongue every 5 (five) minutes as needed. For chest pain 07/07/14   Larey Dresser, MD  OVER THE COUNTER  MEDICATION Place 2 drops into both eyes 4 (four) times daily. Wash type eye drop-    Historical Provider, MD  pantoprazole (PROTONIX) 40 MG tablet Take 40 mg by mouth at bedtime.    Historical Provider, MD  Probiotic Product (PROBIOTIC & ACIDOPHILUS EX ST PO) Take 1 tablet by mouth every morning.     Historical Provider, MD  Psyllium (METAMUCIL PO) Take 20 mLs by mouth every evening.    Historical Provider, MD  ranitidine (ZANTAC) 150 MG tablet Take 150 mg by mouth at bedtime.    Historical Provider, MD  spironolactone (ALDACTONE) 25 MG tablet Take 1 tablet (25 mg total) by mouth daily. Patient taking differently: Take 25 mg by mouth daily. Take extra 25 mg for weight gain and swelling 10/26/14   Larey Dresser, MD  telmisartan (MICARDIS) 40 MG tablet Take 40 mg by mouth every morning.     Historical Provider, MD   BP 137/54 mmHg  Pulse 66  Temp(Src) 98.9 F (37.2 C) (Oral)  Resp 20  SpO2 100% Physical Exam  Constitutional: He is oriented to person, place, and time. He appears well-developed and well-nourished.  HENT:  Head: Normocephalic and atraumatic.  Eyes: Conjunctivae and EOM are normal. Pupils are equal, round, and reactive to light. Right eye exhibits no discharge. Left eye exhibits no discharge. No scleral icterus.  Neck: Normal range of motion. Neck supple. No JVD present.  Cardiovascular: Normal rate, regular rhythm and normal heart sounds.  Exam reveals no gallop and no friction rub.   No murmur heard. Pulmonary/Chest: Effort normal and breath sounds normal. No respiratory distress. He has no wheezes. He has no rales. He exhibits no tenderness.  Abdominal: Soft. He exhibits no distension and no mass. There is no tenderness. There is no rebound and no guarding.  Musculoskeletal: Normal range of motion. He exhibits no edema or tenderness.  Neurological: He is alert and oriented to person, place, and time.  Skin: Skin is warm and dry.  Psychiatric: He has a normal mood and  affect. His behavior  is normal. Judgment and thought content normal.  Nursing note and vitals reviewed.   ED Course  Procedures (including critical care time) Results for orders placed or performed during the hospital encounter of 01/29/15  CBC with Differential/Platelet  Result Value Ref Range   WBC 8.9 4.0 - 10.5 K/uL   RBC 3.63 (L) 4.22 - 5.81 MIL/uL   Hemoglobin 9.8 (L) 13.0 - 17.0 g/dL   HCT 31.7 (L) 39.0 - 52.0 %   MCV 87.3 78.0 - 100.0 fL   MCH 27.0 26.0 - 34.0 pg   MCHC 30.9 30.0 - 36.0 g/dL   RDW 19.0 (H) 11.5 - 15.5 %   Platelets 375 150 - 400 K/uL   Neutrophils Relative % 69 43 - 77 %   Lymphocytes Relative 18 12 - 46 %   Monocytes Relative 10 3 - 12 %   Eosinophils Relative 2 0 - 5 %   Basophils Relative 1 0 - 1 %   Neutro Abs 6.1 1.7 - 7.7 K/uL   Lymphs Abs 1.6 0.7 - 4.0 K/uL   Monocytes Absolute 0.9 0.1 - 1.0 K/uL   Eosinophils Absolute 0.2 0.0 - 0.7 K/uL   Basophils Absolute 0.1 0.0 - 0.1 K/uL   Smear Review MORPHOLOGY UNREMARKABLE   Urinalysis, Routine w reflex microscopic  Result Value Ref Range   Color, Urine AMBER (A) YELLOW   APPearance CLOUDY (A) CLEAR   Specific Gravity, Urine 1.009 1.005 - 1.030   pH 5.0 5.0 - 8.0   Glucose, UA NEGATIVE NEGATIVE mg/dL   Hgb urine dipstick LARGE (A) NEGATIVE   Bilirubin Urine NEGATIVE NEGATIVE   Ketones, ur NEGATIVE NEGATIVE mg/dL   Protein, ur NEGATIVE NEGATIVE mg/dL   Urobilinogen, UA 0.2 0.0 - 1.0 mg/dL   Nitrite NEGATIVE NEGATIVE   Leukocytes, UA TRACE (A) NEGATIVE  Protime-INR  Result Value Ref Range   Prothrombin Time 17.2 (H) 11.6 - 15.2 seconds   INR 1.39 0.00 - 1.49  Urine microscopic-add on  Result Value Ref Range   Squamous Epithelial / LPF RARE RARE   WBC, UA 3-6 <3 WBC/hpf   RBC / HPF TOO NUMEROUS TO COUNT <3 RBC/hpf   Bacteria, UA RARE RARE  I-stat chem 8, ed  Result Value Ref Range   Sodium 136 135 - 145 mmol/L   Potassium 4.3 3.5 - 5.1 mmol/L   Chloride 100 96 - 112 mmol/L   BUN 23 6 - 23  mg/dL   Creatinine, Ser 1.30 0.50 - 1.35 mg/dL   Glucose, Bld 121 (H) 70 - 99 mg/dL   Calcium, Ion 1.12 (L) 1.13 - 1.30 mmol/L   TCO2 20 0 - 100 mmol/L   Hemoglobin 11.2 (L) 13.0 - 17.0 g/dL   HCT 33.0 (L) 39.0 - 52.0 %   No results found.    EKG Interpretation None      MDM   Final diagnoses:  Hematuria    Patient with hematuria that started last night. No evidence of UTI.  He has a known spot on his bladder, which he is waiting to have biopsied. He is followed by Dr. Gaynelle Arabian of [facility]. Patient recently admitted for rectal bleeding. H&H is stable. Vitals are stable. Patient is well-appearing. Will discharge to home with urology follow-up.  Patient seen by and discussed with Dr. Johnney Killian, who agrees with the plan.      Montine Circle, PA-C 01/29/15 1715  Charlesetta Shanks, MD 01/31/15 [phone]

## 2015-01-29 NOTE — ED Notes (Signed)
Patient here with complaints of blood in urine that started at midnight. States that he is passing large clots. Denies n/v/d.

## 2015-01-29 NOTE — Discharge Instructions (Signed)

## 2015-01-30 ENCOUNTER — Other Ambulatory Visit: Payer: Self-pay | Admitting: Oncology

## 2015-01-30 ENCOUNTER — Other Ambulatory Visit: Payer: Medicare Other

## 2015-01-30 ENCOUNTER — Ambulatory Visit (INDEPENDENT_AMBULATORY_CARE_PROVIDER_SITE_OTHER): Payer: Medicare Other | Admitting: Oncology

## 2015-01-30 DIAGNOSIS — K625 Hemorrhage of anus and rectum: Secondary | ICD-10-CM

## 2015-01-30 DIAGNOSIS — K644 Residual hemorrhoidal skin tags: Secondary | ICD-10-CM

## 2015-01-30 DIAGNOSIS — Z86718 Personal history of other venous thrombosis and embolism: Secondary | ICD-10-CM

## 2015-01-30 DIAGNOSIS — K922 Gastrointestinal hemorrhage, unspecified: Secondary | ICD-10-CM

## 2015-01-30 DIAGNOSIS — I81 Portal vein thrombosis: Secondary | ICD-10-CM

## 2015-01-30 DIAGNOSIS — D509 Iron deficiency anemia, unspecified: Secondary | ICD-10-CM

## 2015-01-30 DIAGNOSIS — K648 Other hemorrhoids: Secondary | ICD-10-CM

## 2015-01-30 NOTE — Progress Notes (Signed)
Patient ID: James [name], male   DOB: June 04, 1928, 81 y.o.   MRN: [id number] Hematology and Oncology Follow Up Visit  JHAMARI MARKOWICZ [id number] 03-14-1928 81 y.o. 01/30/2015 1:57 PM   Principle Diagnosis: Encounter Diagnoses  Name Primary?  . Acute GI bleeding Yes  . External hemorrhoids   . Rectal bleeding   . History of DVT (deep vein thrombosis)   . Portal vein thrombosis      Interim History:   Work in  visit for this complicated 81 year old man under evaluation over the last few months for unexplained, dramatic, falls in his hemoglobin requiring hospitalization and multiple endoscopic procedures. Stools are intermittently guaiac positive. There was some suspicion that he might have small bowel AVMs. Initial capsule endoscopy unrevealing. Repeat procedure suggested some active bleeding at the ileocolic junction. This prompted a follow-up colonoscopy done last week on February 4 which did not show any focal site of bleeding. He has required intermittent blood transfusions. Situation complicated by need for antiplatelet agents less than one year status post placement of a drug eluting coronary stent and chronic warfarin anticoagulation in view of left lower extremity DVT and subsequent portal vein thrombosis. There is evidence for idiopathic liver disease with portal hypertension although no gross bleeding varices were seen at time of recent upper endoscopy. He has had intermittent hematuria. He has a focal abnormality in his bladder but biopsies could not be done at time of recent cystoscopy secondary to poor visualization. His warfarin was held in anticipation of the colonoscopy scheduled for last week. His INR was very slow to come down. It was still in the high therapeutic range after being off the drug for about 3 days and was 3.3 on February 4.. He was instructed to continue to hold the warfarin pending a follow-up lab analysis today. However, he presented to the emergency department  yesterday with recurrent gross hematuria. Pro time was 17.2 seconds. INR 1.39 on February 7. Although this speaks to possible hepatic synthetic dysfunction, bilirubin was 0.5 and albumin 3.7 on February 3.  Medications: reviewed  Allergies:  Allergies  Allergen Reactions  . Aleve [Naproxen Sodium] Other (See Comments)    Causes platlet drop  . Rofecoxib Other (See Comments)    Pt doesn't remember  . Sulfa Antibiotics Other (See Comments)    Pt doesn't remember  . Sulfamethoxazole-Trimethoprim Other (See Comments)     Causes low platelets and bleeding  . Tramadol Hcl Other (See Comments)    dizziness, drowsiness  . Neomycin-Bacitracin Zn-Polymyx Rash  . Neosporin [Neomycin-Polymyxin-Gramicidin] Rash  . Penicillins Swelling and Rash    Review of Systems: See HPI  Remaining ROS negative:   Physical Exam: There were no vitals taken for this visit. Wt Readings from Last 3 Encounters:  01/25/15 240 lb (108.863 kg)  01/17/15 240 lb (108.863 kg)  12/09/14 251 lb 9.6 oz (114.125 kg)     General appearance: Well-nourished Caucasian man HENNT: Pharynx no erythema, exudate, mass, or ulcer. No thyromegaly or thyroid nodules Lymph nodes: No cervical, supraclavicular, or axillary lymphadenopathy Breasts: Lungs: Clear to auscultation, resonant to percussion throughout Heart: Regular rhythm, no murmur, no gallop, no rub, no click, no edema Abdomen: Soft, nontender, normal bowel sounds, no mass, no organomegaly Extremities: No edema, no calf tenderness Musculoskeletal: no joint deformities GU:  Vascular: Carotid pulses 2+, no bruits, Neurologic: Alert, oriented, cranial nerves grossly normal, motor strength 5 over 5, reflexes 1+ symmetric, upper body coordination normal, gait normal, Skin: No rash or ecchymosis  Lab Results: CBC W/Diff    Component Value Date/Time   WBC 8.9 01/29/2015 1532   WBC 7.6 11/24/2013 0812   RBC 3.63* 01/29/2015 1532   RBC 3.18* 11/14/2014 1132   RBC  4.28 11/24/2013 0812   HGB 11.2* 01/29/2015 1545   HGB 13.0 11/24/2013 0812   HCT 33.0* 01/29/2015 1545   HCT 40.3 11/24/2013 0812   PLT 375 01/29/2015 1532   PLT 271 11/24/2013 0812   MCV 87.3 01/29/2015 1532   MCV 94.2 11/24/2013 0812   MCH 27.0 01/29/2015 1532   MCH 30.4 11/24/2013 0812   MCHC 30.9 01/29/2015 1532   MCHC 32.3 11/24/2013 0812   RDW 19.0* 01/29/2015 1532   RDW 14.7* 11/24/2013 0812   LYMPHSABS 1.6 01/29/2015 1532   LYMPHSABS 1.9 11/24/2013 0812   MONOABS 0.9 01/29/2015 1532   MONOABS 0.4 11/24/2013 0812   EOSABS 0.2 01/29/2015 1532   EOSABS 0.2 11/24/2013 0812   BASOSABS 0.1 01/29/2015 1532   BASOSABS 0.0 11/24/2013 0812     Chemistry      Component Value Date/Time   NA 136 01/29/2015 1545   NA 141 11/24/2013 0813   K 4.3 01/29/2015 1545   K 3.8 11/24/2013 0813   CL 100 01/29/2015 1545   CO2 22 01/25/2015 1026   CO2 23 11/24/2013 0813   BUN 23 01/29/2015 1545   BUN 12.3 11/24/2013 0813   CREATININE 1.30 01/29/2015 1545   CREATININE 1.23 11/07/2014 0921   CREATININE 0.9 11/24/2013 0813      Component Value Date/Time   CALCIUM 9.1 01/25/2015 1026   CALCIUM 9.5 11/24/2013 0813   ALKPHOS 117 01/25/2015 1026   ALKPHOS 87 11/24/2013 0813   AST 30 01/25/2015 1026   AST 25 11/24/2013 0813   ALT 39 01/25/2015 1026   ALT 26 11/24/2013 0813   BILITOT 0.5 01/25/2015 1026   BILITOT 0.57 11/24/2013 0813       Radiological Studies: No results found.  Impression:  Status post left lower extremity DVT December 2014, status post subacute portal vein thrombosis and diagnosed late August 2015. Idiopathic portal hypertension/nonalcoholic related cirrhosis. Coronary artery disease status post drug-eluting stent placement in July 2015. Multifactorial anemia with components of iron malabsorption and intermittent GI blood loss possibly related to the portal hypertension. Intermittent gross hematuria with focal abnormality in the bladder under active  evaluation.  Plan: Management becoming increasingly complex with multiple specialists involved. His bleeding risk has become greater than his lining risk and I am going to stop his Coumadin at this time. I advised him to go back on both low-dose aspirin and Plavix. He can go ahead and reschedule with urology to have his bladder evaluated.      CC: Patient Care Team: Tonia Ghent, MD as PCP - General [name], MD as Consulting Physician (Pulmonary Disease) Larey Dresser, MD as Consulting Physician (Cardiology) Annia Belt, MD as Consulting Physician (Oncology) Gatha Mayer, MD as Consulting Physician (Gastroenterology)   Annia Belt, MD 2/8/20161:57 PM

## 2015-01-31 ENCOUNTER — Other Ambulatory Visit (HOSPITAL_COMMUNITY): Payer: Self-pay

## 2015-01-31 LAB — TYPE AND SCREEN
ABO/RH(D): A NEG
ANTIBODY SCREEN: NEGATIVE
UNIT DIVISION: 0
UNIT DIVISION: 0
Unit division: 0

## 2015-01-31 LAB — URINE CULTURE
Colony Count: NO GROWTH
Culture: NO GROWTH

## 2015-02-01 ENCOUNTER — Other Ambulatory Visit: Payer: Self-pay | Admitting: Urology

## 2015-02-02 ENCOUNTER — Encounter (HOSPITAL_COMMUNITY): Payer: Self-pay | Admitting: *Deleted

## 2015-02-02 DIAGNOSIS — Z79899 Other long term (current) drug therapy: Secondary | ICD-10-CM | POA: Diagnosis not present

## 2015-02-02 DIAGNOSIS — C675 Malignant neoplasm of bladder neck: Secondary | ICD-10-CM | POA: Diagnosis not present

## 2015-02-02 DIAGNOSIS — Z86718 Personal history of other venous thrombosis and embolism: Secondary | ICD-10-CM | POA: Diagnosis not present

## 2015-02-02 DIAGNOSIS — R310 Gross hematuria: Secondary | ICD-10-CM | POA: Diagnosis present

## 2015-02-02 DIAGNOSIS — R3915 Urgency of urination: Secondary | ICD-10-CM | POA: Diagnosis not present

## 2015-02-02 DIAGNOSIS — R351 Nocturia: Secondary | ICD-10-CM | POA: Diagnosis not present

## 2015-02-02 DIAGNOSIS — Z7902 Long term (current) use of antithrombotics/antiplatelets: Secondary | ICD-10-CM | POA: Diagnosis not present

## 2015-02-02 DIAGNOSIS — I2510 Atherosclerotic heart disease of native coronary artery without angina pectoris: Secondary | ICD-10-CM | POA: Diagnosis not present

## 2015-02-02 DIAGNOSIS — C672 Malignant neoplasm of lateral wall of bladder: Secondary | ICD-10-CM | POA: Diagnosis not present

## 2015-02-02 DIAGNOSIS — N3941 Urge incontinence: Secondary | ICD-10-CM | POA: Diagnosis not present

## 2015-02-02 DIAGNOSIS — N401 Enlarged prostate with lower urinary tract symptoms: Secondary | ICD-10-CM | POA: Diagnosis not present

## 2015-02-02 DIAGNOSIS — Z7901 Long term (current) use of anticoagulants: Secondary | ICD-10-CM | POA: Diagnosis not present

## 2015-02-02 DIAGNOSIS — I10 Essential (primary) hypertension: Secondary | ICD-10-CM | POA: Diagnosis not present

## 2015-02-02 NOTE — Progress Notes (Signed)
Interviewed patient over the phone for surgery 02/03/2015 and went over medical history and patient informed me that Dr. Arlyn Leak nurse called him yesterday and informed him to not stop taking his Asoirin and Plavix before surgery. Called and talked to [facility] to verify this with her. [name] is to verify this with Dr. Arlyn Leak nurse.

## 2015-02-02 NOTE — Anesthesia Preprocedure Evaluation (Addendum)
Anesthesia Evaluation  Patient identified by MRN, date of birth, ID band Patient awake    Reviewed: Allergy & Precautions, H&P , NPO status , Patient's Chart, lab work & pertinent test results  Airway Mallampati: II  TM Distance: >3 FB Neck ROM: Full    Dental  (+) Teeth Intact, Dental Advisory Given   Pulmonary shortness of breath, pneumonia -, resolved, former smoker,  breath sounds clear to auscultation        Cardiovascular hypertension, Pt. on medications + CAD (Drug eluting stents placed ini July 0263 without complications), + Peripheral Vascular Disease and +CHF Rhythm:Regular Rate:Normal     Neuro/Psych PSYCHIATRIC DISORDERS Anxiety    GI/Hepatic negative GI ROS, Neg liver ROS, hiatal hernia, GERD-  Medicated and Controlled,  Endo/Other  negative endocrine ROS  Renal/GU Renal disease     Musculoskeletal  (+) Arthritis -,   Abdominal   Peds  Hematology negative hematology ROS (+) anemia ,   Anesthesia Other Findings   Reproductive/Obstetrics negative OB ROS                          Anesthesia Physical  Anesthesia Plan  ASA: III  Anesthesia Plan: General   Post-op Pain Management:    Induction: Intravenous  Airway Management Planned: LMA  Additional Equipment:   Intra-op Plan:   Post-operative Plan: Extubation in OR  Informed Consent: I have reviewed the patients History and Physical, chart, labs and discussed the procedure including the risks, benefits and alternatives for the proposed anesthesia with the patient or authorized representative who has indicated his/her understanding and acceptance.   Dental advisory given  Plan Discussed with: CRNA  Anesthesia Plan Comments:        Anesthesia Quick Evaluation

## 2015-02-03 ENCOUNTER — Ambulatory Visit (HOSPITAL_COMMUNITY)
Admission: RE | Admit: 2015-02-03 | Discharge: 2015-02-03 | Disposition: A | Discharge status: Home / Self Care | Payer: Medicare Other | Source: Ambulatory Visit | Attending: Urology | Admitting: Urology

## 2015-02-03 ENCOUNTER — Encounter (HOSPITAL_COMMUNITY): Payer: Self-pay | Admitting: *Deleted

## 2015-02-03 ENCOUNTER — Ambulatory Visit (HOSPITAL_COMMUNITY): Payer: Medicare Other | Admitting: Anesthesiology

## 2015-02-03 ENCOUNTER — Encounter (HOSPITAL_COMMUNITY)
Admission: RE | Disposition: A | Discharge status: Home / Self Care | Payer: Self-pay | Source: Ambulatory Visit | Attending: Urology

## 2015-02-03 DIAGNOSIS — R3915 Urgency of urination: Secondary | ICD-10-CM | POA: Insufficient documentation

## 2015-02-03 DIAGNOSIS — Z7901 Long term (current) use of anticoagulants: Secondary | ICD-10-CM | POA: Insufficient documentation

## 2015-02-03 DIAGNOSIS — R310 Gross hematuria: Secondary | ICD-10-CM | POA: Insufficient documentation

## 2015-02-03 DIAGNOSIS — C670 Malignant neoplasm of trigone of bladder: Secondary | ICD-10-CM

## 2015-02-03 DIAGNOSIS — Z79899 Other long term (current) drug therapy: Secondary | ICD-10-CM | POA: Insufficient documentation

## 2015-02-03 DIAGNOSIS — R351 Nocturia: Secondary | ICD-10-CM | POA: Insufficient documentation

## 2015-02-03 DIAGNOSIS — N3941 Urge incontinence: Secondary | ICD-10-CM | POA: Insufficient documentation

## 2015-02-03 DIAGNOSIS — I2510 Atherosclerotic heart disease of native coronary artery without angina pectoris: Secondary | ICD-10-CM | POA: Insufficient documentation

## 2015-02-03 DIAGNOSIS — Z7902 Long term (current) use of antithrombotics/antiplatelets: Secondary | ICD-10-CM | POA: Insufficient documentation

## 2015-02-03 DIAGNOSIS — C675 Malignant neoplasm of bladder neck: Secondary | ICD-10-CM | POA: Diagnosis not present

## 2015-02-03 DIAGNOSIS — C672 Malignant neoplasm of lateral wall of bladder: Secondary | ICD-10-CM | POA: Insufficient documentation

## 2015-02-03 DIAGNOSIS — I10 Essential (primary) hypertension: Secondary | ICD-10-CM | POA: Diagnosis not present

## 2015-02-03 DIAGNOSIS — Z86718 Personal history of other venous thrombosis and embolism: Secondary | ICD-10-CM | POA: Insufficient documentation

## 2015-02-03 DIAGNOSIS — N401 Enlarged prostate with lower urinary tract symptoms: Secondary | ICD-10-CM | POA: Insufficient documentation

## 2015-02-03 HISTORY — PX: CYSTOSCOPY WITH BIOPSY: SHX5122

## 2015-02-03 SURGERY — CYSTOSCOPY, WITH BIOPSY
Anesthesia: General | Laterality: Right

## 2015-02-03 MED ORDER — PROMETHAZINE HCL 25 MG/ML IJ SOLN: 6.2500 mg | INTRAMUSCULAR | Status: DC | PRN

## 2015-02-03 MED ORDER — ONDANSETRON HCL 4 MG/2ML IJ SOLN
INTRAMUSCULAR | Status: DC | PRN
  Administered 2015-02-03: 4 mg via INTRAVENOUS

## 2015-02-03 MED ORDER — OXYBUTYNIN CHLORIDE 5 MG PO TABS: ORAL_TABLET | ORAL | Status: DC

## 2015-02-03 MED ORDER — LACTATED RINGERS IV SOLN
INTRAVENOUS | Status: DC | PRN
  Administered 2015-02-03: 07:00:00 via INTRAVENOUS

## 2015-02-03 MED ORDER — PROPOFOL 10 MG/ML IV BOLUS
INTRAVENOUS | Status: AC
  Filled 2015-02-03: qty 20

## 2015-02-03 MED ORDER — FENTANYL CITRATE 0.05 MG/ML IJ SOLN
INTRAMUSCULAR | Status: DC | PRN
  Administered 2015-02-03: 25 ug via INTRAVENOUS
  Administered 2015-02-03: 50 ug via INTRAVENOUS
  Administered 2015-02-03: 25 ug via INTRAVENOUS

## 2015-02-03 MED ORDER — LIDOCAINE HCL (CARDIAC) 20 MG/ML IV SOLN
INTRAVENOUS | Status: AC
  Filled 2015-02-03: qty 5

## 2015-02-03 MED ORDER — SODIUM CHLORIDE 0.9 % IR SOLN
Status: DC | PRN
  Administered 2015-02-03: 3000 mL

## 2015-02-03 MED ORDER — MEPERIDINE HCL 50 MG/ML IJ SOLN: 6.2500 mg | INTRAMUSCULAR | Status: DC | PRN

## 2015-02-03 MED ORDER — SODIUM CHLORIDE 0.9 % IJ SOLN
INTRAMUSCULAR | Status: AC
  Filled 2015-02-03: qty 10

## 2015-02-03 MED ORDER — PHENAZOPYRIDINE HCL 200 MG PO TABS: 200.0000 mg | ORAL_TABLET | Freq: Three times a day (TID) | ORAL | Status: DC | PRN

## 2015-02-03 MED ORDER — FENTANYL CITRATE 0.05 MG/ML IJ SOLN
INTRAMUSCULAR | Status: AC
  Filled 2015-02-03: qty 2

## 2015-02-03 MED ORDER — FENTANYL CITRATE 0.05 MG/ML IJ SOLN
25.0000 ug | INTRAMUSCULAR | Status: DC | PRN
  Administered 2015-02-03: 25 ug via INTRAVENOUS
  Administered 2015-02-03 (×2): 50 ug via INTRAVENOUS
  Administered 2015-02-03: 25 ug via INTRAVENOUS

## 2015-02-03 MED ORDER — OXYBUTYNIN CHLORIDE 5 MG PO TABS
5.0000 mg | ORAL_TABLET | ORAL | Status: AC
  Administered 2015-02-03: 5 mg via ORAL
  Filled 2015-02-03: qty 1

## 2015-02-03 MED ORDER — CEFAZOLIN SODIUM-DEXTROSE 2-3 GM-% IV SOLR
INTRAVENOUS | Status: AC
  Filled 2015-02-03: qty 50

## 2015-02-03 MED ORDER — OXYCODONE-ACETAMINOPHEN 5-325 MG PO TABS
1.0000 | ORAL_TABLET | ORAL | Status: DC | PRN
  Administered 2015-02-03: 1 via ORAL
  Filled 2015-02-03: qty 1

## 2015-02-03 MED ORDER — STERILE WATER FOR IRRIGATION IR SOLN
Status: DC | PRN
  Administered 2015-02-03: 5000 mL

## 2015-02-03 MED ORDER — LIDOCAINE HCL (CARDIAC) 20 MG/ML IV SOLN
INTRAVENOUS | Status: DC | PRN
  Administered 2015-02-03: 50 mg via INTRAVENOUS

## 2015-02-03 MED ORDER — CIPROFLOXACIN IN D5W 400 MG/200ML IV SOLN
INTRAVENOUS | Status: AC
  Filled 2015-02-03: qty 200

## 2015-02-03 MED ORDER — PHENAZOPYRIDINE HCL 200 MG PO TABS
200.0000 mg | ORAL_TABLET | ORAL | Status: AC
  Administered 2015-02-03: 200 mg via ORAL
  Filled 2015-02-03: qty 1

## 2015-02-03 MED ORDER — PROPOFOL 10 MG/ML IV BOLUS
INTRAVENOUS | Status: DC | PRN
  Administered 2015-02-03: 150 mg via INTRAVENOUS
  Administered 2015-02-03: 50 mg via INTRAVENOUS

## 2015-02-03 MED ORDER — EPHEDRINE SULFATE 50 MG/ML IJ SOLN
INTRAMUSCULAR | Status: AC
  Filled 2015-02-03: qty 1

## 2015-02-03 MED ORDER — CIPROFLOXACIN IN D5W 400 MG/200ML IV SOLN
400.0000 mg | INTRAVENOUS | Status: AC
  Administered 2015-02-03: 400 mg via INTRAVENOUS

## 2015-02-03 MED ORDER — OXYCODONE-ACETAMINOPHEN 5-325 MG PO TABS: 1.0000 | ORAL_TABLET | ORAL | Status: DC | PRN

## 2015-02-03 MED ORDER — ONDANSETRON HCL 4 MG/2ML IJ SOLN
INTRAMUSCULAR | Status: AC
  Filled 2015-02-03: qty 2

## 2015-02-03 SURGICAL SUPPLY — 20 items
BAG URO CATCHER STRL LF (DRAPE) ×3 IMPLANT
CATH HEMATURIA 20FR (CATHETERS) ×3 IMPLANT
CATH ROBINSON RED A/P 16FR (CATHETERS) IMPLANT
CATH ROBINSON RED A/P 18FR (CATHETERS) IMPLANT
CLOTH BEACON ORANGE TIMEOUT ST (SAFETY) ×3 IMPLANT
GLOVE BIOGEL M STRL SZ7.5 (GLOVE) ×9 IMPLANT
GOWN STRL REUS W/TWL LRG LVL3 (GOWN DISPOSABLE) ×3 IMPLANT
GOWN STRL REUS W/TWL XL LVL3 (GOWN DISPOSABLE) ×3 IMPLANT
LOOP CUT BIPOLAR 24F LRG (ELECTROSURGICAL) ×3 IMPLANT
MANIFOLD NEPTUNE II (INSTRUMENTS) ×3 IMPLANT
NDL SAFETY ECLIPSE 18X1.5 (NEEDLE) IMPLANT
NEEDLE HYPO 18GX1.5 SHARP (NEEDLE)
NEEDLE HYPO 22GX1.5 SAFETY (NEEDLE) IMPLANT
PACK CYSTO (CUSTOM PROCEDURE TRAY) ×3 IMPLANT
PLUG CATH AND CAP STER (CATHETERS) ×3 IMPLANT
SCRUB PCMX 4 OZ (MISCELLANEOUS) IMPLANT
SYR BULB IRRIGATION 50ML (SYRINGE) IMPLANT
TUBING CONNECTING 10 (TUBING) ×4 IMPLANT
TUBING CONNECTING 10' (TUBING) ×2
WATER STERILE IRR 1500ML POUR (IV SOLUTION) IMPLANT

## 2015-02-03 NOTE — Interval H&P Note (Signed)
History and Physical Interval Note:  02/03/2015 7:47 AM  James [name]  has presented today for surgery, with the diagnosis of GROSS HEMATURIA  The various methods of treatment have been discussed with the patient and family. After consideration of risks, benefits and other options for treatment, the patient has consented to  Procedure(s): [place] (N/A) as a surgical intervention .  The patient's history has been reviewed, patient examined, no change in status, stable for surgery.  I have reviewed the patient's chart and labs.  Questions were answered to the patient's satisfaction.     Carolan Clines I

## 2015-02-03 NOTE — H&P (Signed)
Reason For Visit Cystoscopy   Active Problems Problems  1. Benign localized prostatic hyperplasia without lower urinary tract  symptoms (LUTS) (N40.0)   Assessed By: Carolan Clines (Urology); Last Assessed: 04 Jan 2015 2. Gross hematuria (R31.0) 3. Nocturia (R35.1)   Assessed By: Jimmey Ralph (Urology); Last Assessed: 16 Sep 2014 4. Urge incontinence of urine (N39.41)   Assessed By: Carolan Clines (Urology); Last Assessed: 04 Jan 2015 5. Urinary urgency (R39.15)  History of Present Illness     81 YO male returns today for a cystoscopy for hx of hematuria. He was last seen by Jimmey Ralph, NP on 11/24/14 for painless gross hematuria 5 days ago. Is on chronic Plavix and Warfarin daily. Saw Dr. Beryle Beams 4 days ago and was instructed to f/u here. Had CT abd/pelvis w/contrast Oct 2015 which did not show any renal, ureteral, or bladder stones or masses. This CT was f/u on liver issues he is having.     HX of incontinence & urgency getting PTNS therapy (#56) Q8 weeks maintenance. He is doing very well on PTNS. Nocturia has decreased to 0-1. He has urgency/frequency first thing in the morning because he takes Lasix 80mg . He failed Vesicare and Oxytrol in the past.     Sept 2015 Interval HX:   States that his voiding is excellent. No longer having and accidental incontinence or nocturia is rare. Denies hematuria or dysuria.   Past Medical History Problems  1. History of CAD (coronary artery disease) (I25.10) 2. History of Cancer 3. History of Esophageal varices with bleeding (I85.01) 4. History of anemia (Z86.2) 5. History of deep venous thrombosis (Z86.718) 6. History of esophageal reflux (Z87.19) 7. History of hypercholesterolemia (Z86.39) 8. History of hypertension (Z86.79)  Surgical History Problems  1. History of Appendectomy 2. History of Cath Stent Placement 3. History of Colonoscopy (Fiberoptic) 4. History of Esophagoscopy With Control Of  Bleeding 5. History of Hernia Repair 6. History of Jaw Surgery 7. History of Knee Replacement 8. History of Surgery Testis  Current Meds 1. Allopurinol 300 MG Oral Tablet;  Therapy: (Recorded:25Sep2015) to Recorded 2. Furosemide 40 MG Oral Tablet;  Therapy: (Recorded:25Sep2015) to Recorded 3. Livalo 4 MG Oral Tablet;  Therapy: (Recorded:25Sep2015) to Recorded 4. Metamucil POWD;  Therapy: (Recorded:25Sep2015) to Recorded 5. Multi Vitamin/Minerals TABS;  Therapy: (Recorded:25Sep2015) to Recorded 6. Nitroglycerin 0.4 MG SUBL;  Therapy: (Recorded:25Sep2015) to Recorded 7. Osteo-Bi-Flex TABS;  Therapy: (Recorded:25Sep2015) to Recorded 8. Pantoprazole Sodium 40 MG Oral Tablet Delayed Release;  Therapy: (Recorded:25Sep2015) to Recorded 9. Plavix 75 MG Oral Tablet;  Therapy: (Recorded:25Sep2015) to Recorded 10. Probiotic CAPS;   Therapy: (Recorded:25Sep2015) to Recorded 11. Telmisartan 40 MG Oral Tablet;   Therapy: (Recorded:25Sep2015) to Recorded 12. Vitamin D3 2000 UNIT Oral Tablet;   Therapy: (Recorded:25Sep2015) to Recorded 13. Warfarin Sodium 5 MG Oral Tablet;   Therapy: (Recorded:25Sep2015) to Recorded  Allergies Medication  1. Neosporin OINT 2. Penicillins 3. Septra TABS  Family History Problems  1. Family history of Acute Myocardial Infarction : Father 2. Family history of Cancer : Mother 3. Family history of Family Health Status Number Of Children   2 DAUGHTERS 4. Family history of Nephrolithiasis : Father  Social History Problems  1. Activities Of Daily Living 2. Denied: Caffeine Use 3. Family history of Death In The Family Father   AGE 3 - HEART ATTACK 4. Family history of Death In The Family Mother   AGE 13 - CANCER 5. Exercise Habits   No  formal active exercise at this time except for light chores around the     house.  He is limited due to indicates weakness of his lower extremities.      He gives a history of a total knee replacement on the left  2 years ago. 6. Former smoker [phone]) 7. Living Independently With Spouse 8. Marital History - Currently Married 9. Occupation:   RETIRED 10. Self-reliant In Usual Daily Activities 11. History of Tobacco Use   2 PKS PER DAY FOR 25 YRS - QUIT 2 YRS  Review of Systems Genitourinary, constitutional, skin, eye, otolaryngeal, hematologic/lymphatic, cardiovascular, pulmonary, endocrine, musculoskeletal, gastrointestinal, neurological and psychiatric system(s) were reviewed and pertinent findings if present are noted and are otherwise negative.  Genitourinary: hematuria.    Vitals Vital Signs [Data Includes: Last 1 Day]  Recorded: [id number] 02:23PM  Blood Pressure: 99 / 55 Temperature: 97 F Heart Rate: 71  Physical Exam Constitutional: Well nourished and well developed . No acute distress. The patient appears well hydrated.  ENT:. The ears and nose are normal in appearance.  Neck: The appearance of the neck is normal and no neck mass is present.  Pulmonary: No respiratory distress and normal respiratory rhythm and effort.  Abdomen: The abdomen is obese. The abdomen is soft and nontender. No masses are palpated. No CVA tenderness. No hernias are palpable. No hepatosplenomegaly noted.  Rectal: Rectal exam demonstrates decreased sphincter tone, no tenderness and no masses. Normal rectal tone, no rectal masses, prostate is smooth, symmetric and non-tender. The prostate has no nodularity and is not tender. The left seminal vesicle is nonpalpable. The right seminal vesicle is nonpalpable. The perineum is normal on inspection.  Genitourinary: Examination of the penis demonstrates no discharge, no masses, no lesions and a normal meatus. The penis is circumcised. The scrotum is normal in appearance and without lesions. The right epididymis is palpably normal and non-tender. The left epididymis is palpably normal and non-tender. The right testis is non-tender and without masses. The left testis is  non-tender and without masses.  Lymphatics: The femoral and inguinal nodes are not enlarged or tender.  Skin: Normal skin turgor, no visible rash and no visible skin lesions.  Neuro/Psych:. Mood and affect are appropriate.    Procedure  Procedure: Cystoscopy  Chaperone Present: Hillary Bow, CMA.  Indication: Hematuria. Lower Urinary Tract Symptoms.  Informed Consent: Risks, benefits, and potential adverse events were discussed and informed consent was obtained from the patient.  Prep: The patient was prepped with betadine.  Anesthesia:. Local anesthesia was administered intraurethrally with 2% lidocaine jelly.  Antibiotic prophylaxis: Ciprofloxacin.  Procedure Note:  Urethral meatus:. No abnormalities.  Anterior urethra: No abnormalities.  Prostatic urethra:. The lateral prostatic lobes were enlarged. No intravesical median lobe was visualized.  Bladder: Visulization was obscured due to cloudy urine. The ureteral orifices were in the normal anatomic position bilaterally. A systematic survey of the bladder demonstrated no bladder tumors or stones. Examination of the bladder demonstrated no clot within the bladder, no trabeculation and no diverticulum erythematous mucosa located on the anterior aspect, on the left side of the bladder , but no cellules. The patient tolerated the procedure well.  Complications: None.    Assessment Assessed  1. Gross hematuria (R31.0) 2. Urge incontinence of urine (N39.41) 3. Urinary urgency (R39.15) 4. Benign localized prostatic hyperplasia without lower urinary tract  symptoms (LUTS) (N40.0) 5. Bladder Mass (___ Cm)  81 yo male with ITP, and gross hematuria. CT negative. Cysto shows possible TCC L  lateral wall-but pt would need cold-cup biopsies and Mitomycin-c instillation.    His INR is checked on Monday, and he may need to see Dr. Beryle Beams. I will awit for note from [facility].   Plan  Benign localized prostatic hyperplasia without lower  urinary tract symptoms (LUTS)  1. Start: Finasteride 5 MG Oral Tablet; TAKE ONE TABLET BY MOUTH  DAILY Gross hematuria, Urge incontinence of urine, Urinary urgency  2. Start: Finasteride 5 MG Oral Tablet; TAKE ONE TABLET BY MOUTH  DAILY  finasteride  need for cold-cup bladder biopsies od thw bladder.   would need clearance from Dr. Beryle Beams.    UA With REFLEX; [Do Not Release]; Status:Complete;  Done: [id number] 12:00AM  Due:15Jan2016; Marked Important; Last Updated EK:CMKLKJ, Diane; 01/04/2015 2:22:30 PM;Ordered; Today;   ZPH:XTAVWP Maintenance; Ordered VX:[id number], Zilpha Mcandrew;   Discussion/Summary cc: Dr. Vilinda Blanks Electronically signed by : Carolan Clines, M.D.; Jan 04 2015  2:59PM EST

## 2015-02-03 NOTE — Discharge Instructions (Signed)
Bladder Cancer Bladder cancer is an abnormal growth of tissue in your bladder. Your bladder is the balloon-like sac in your pelvis. It collects and stores urine that comes from the kidneys through the ureters. The bladder wall is made of layers. If cancer spreads into these layers and through the wall of the bladder, it becomes more difficult to treat.  There are four stages of bladder cancer:  Stage I. Cancer at this stage occurs in the bladder's inner lining but has not invaded the muscular bladder wall.  Stage II. At this stage, cancer has invaded the bladder wall but is still confined to the bladder.  Stage III. By this stage, the cancer cells have spread through the bladder wall to surrounding tissue. They may also have spread to the prostate in men or the uterus or vagina in women.  Stage IV. By this stage, cancer cells may have spread to the lymph nodes and other organs, such as your lungs, bones, or liver. RISK FACTORS Although the cause of bladder cancer is not known, the following risk factors can increase your chances of getting bladder cancer:   Smoking.   Occupational exposures, such as rubber, leather, textile, dyes, chemicals, and paint.  Being white.  Age.   Being male.   Having chronic bladder inflammation.   Having a bladder cancer history.   Having a family history of bladder cancer (heredity).   Having had chemotherapy or radiation therapy to the pelvis.   Being exposed to arsenic.  SYMPTOMS   Blood in the urine.   Pain with urination.   Frequent bladder or urine infections.  Increase in urgency and frequency of urination. DIAGNOSIS  Your health care provider may suspect bladder cancer based on your description of urinary symptoms or based on the finding of blood or infection in the urine (especially if this has recurred several times). Other tests or procedures that may be performed include:   A narrow tube being inserted into your bladder  through your urethra (cystoscopy) in order to view the lining of your bladder for tumors.   A biopsy to sample the tumor to see if cancer is present.  If cancer is present, it will then be staged to determine its severity and extent. It is important to know how deeply into the bladder wall the cancer has grown and whether the cancer has spread to any other parts of your body. Staging may require blood tests or special scans such as a CT scan, MRI, bone scan, or chest X-ray.  TREATMENT  Once your cancer has been diagnosed and staged, you should discuss a treatment plan with your health care provider. Based on the stage of the cancer, one treatment or a combination of treatments may be recommended. The most common forms of treatment are:   Surgery. Procedures that may be done include transurethral resection and cystectomy.  Radiation therapy. This is infrequently used to treat bladder cancer.   Chemotherapy. During this treatment, drugs are used to kill cancer cells.  Immunotherapy. This is usually administered directly into the bladder. HOME CARE INSTRUCTIONS  Take medicines only as directed by your health care provider.   Maintain a healthy diet.   Consider joining a support group. This may help you learn to cope with the stress of having bladder cancer.   Seek advice to help you manage treatment side effects.   Keep all follow-up visits as directed by your health care provider.   Inform your cancer specialist if you are  stress of having bladder cancer.    · Seek advice to help you manage treatment side effects.    · Keep all follow-up visits as directed by your health care provider.    · Inform your cancer specialist if you are admitted to the hospital.    SEEK MEDICAL CARE IF:  · There is blood in your urine.  · You have symptoms of a urinary tract infection. These include:  ¨ Tiredness.  ¨ Shakiness.  ¨ Weakness.  ¨ Muscle aches.  ¨ Abdominal pain.  ¨ Frequent and intense urge to urinate (in young women).  ¨ Burning feeling in the bladder or urethra during urination (in young women).  SEEK IMMEDIATE MEDICAL CARE IF:   You are unable to urinate.  Document Released: 12/12/2003 Document  Revised: 04/25/2014 Document Reviewed: 06/01/2013  ExitCare® Patient Information ©2015 [employer]. This information is not intended to replace advice given to you by your health care provider. Make sure you discuss any questions you have with your health care provider.

## 2015-02-03 NOTE — Transfer of Care (Signed)
Immediate Anesthesia Transfer of Care Note  Patient: James [name]  Procedure(s) Performed: Procedure(s): CYSTOSCOPY WITH COLD CUP BLADDER BIOPSY CAUTHERIZAITON OF RIGHT BLADDER CANCER AND SATILITE, TURBT RIGHT BLADDER BASE (Right)  Patient Location: PACU  Anesthesia Type:General  Level of Consciousness: awake, alert  and oriented  Airway & Oxygen Therapy: Patient Spontanous Breathing and Patient connected to face mask oxygen  Post-op Assessment: Report given to RN and Post -op Vital signs reviewed and stable  Post vital signs: Reviewed and stable  Last Vitals:  Filed Vitals:   02/03/15 0530  BP: 135/57  Pulse: 68  Temp: 36.3 C  Resp: 18    Complications: No apparent anesthesia complications

## 2015-02-03 NOTE — Progress Notes (Signed)
Patient has gone home with foley catheters in past. He desires leg bag for convenience. Leg strap intact. This is 3 way foley catheter as patient spent 01/29/15 in ER for clots in catheter. His daughter verbalizes understanding of care of catheter.

## 2015-02-03 NOTE — Op Note (Signed)
Pre-operative diagnosis : Gross hematuria  Postoperative diagnosis:  Bladder cancer  Operation:  Cystoscopy, cold cup bladder tumor of 2cm satellite Right bladder base tumor, cauterization of satellite bladder tumor site: TUR-BT of 3cm right bladder base bladder tumor.  Surgeon:  Chauncey Cruel. Gaynelle Arabian, MD  First assistant:  None  Anesthesia:  General LMA}  Preparation  after appropriate pre-anesthesia, the patient was brought the operating room, placed on the operating table in the dorsal supine position where general LMA anesthesia was introduced. He was then replaced in the dorsal lithotomy position with the pubis was prepped with Betadine solution and draped in usual fashion. The arm and was double checked. The history was double checked.  Review history:  Benign localized prostatic hyperplasia without lower urinary tract symptoms (LUTS) (N40.0)  Assessed By: Carolan Clines (Urology); Last Assessed: 04 Jan 2015 2. Gross hematuria (R31.0) 3. Nocturia (R35.1)  Assessed By: Jimmey Ralph (Urology); Last Assessed: 16 Sep 2014 4. Urge incontinence of urine (N39.41)  Assessed By: Carolan Clines (Urology); Last Assessed: 04 Jan 2015 5. Urinary urgency (R39.15)  History of Present Illness    81 YO male returns today for a cystoscopy for hx of hematuria. He was last seen by Jimmey Ralph, NP on 11/24/14 for painless gross hematuria 5 days ago. Is on chronic Plavix and Warfarin daily. Saw Dr. Beryle Beams 4 days ago and was instructed to f/u here. Had CT abd/pelvis w/contrast Oct 2015 which did not show any renal, ureteral, or bladder stones or masses. This CT was f/u on liver issues he is having.     HX of incontinence & urgency getting PTNS therapy (#56) Q8 weeks maintenance. He is doing very well on PTNS. Nocturia has decreased to 0-1. He has urgency/frequency first thing in the morning because he takes Lasix 80mg . He failed Vesicare and Oxytrol in the past.     Sept  2015 Interval HX:   States that his voiding is excellent. No longer having and accidental incontinence or nocturia is rare. Denies hematuria or dysuria.   Past Medical History Problems  1. History of CAD (coronary artery disease) (I25.10) 2. History of Cancer 3. History of Esophageal varices with bleeding (I85.01) 4. History of anemia (Z86.2) 5. History of deep venous thrombosis (Z86.718) 6. History of esophageal reflux (Z87.19) 7. History of hypercholesterolemia (Z86.39) 8. History of hypertension (Z86.79)   Statement of  Likelihood of Success: Excellent. TIME-OUT observed.:  Procedure:  Cystourethroscopy was accomplished, and the entire bladder was evaluated. 2 bladder cancers were identified, both at the right bladder base. One bladder tumor was 3 cm, and appeared to be a TCC in appearance, just inside the bladder neck, and a satellite 2 cm TCC lesion was identified more proximal were on the right bladder base. Using the 0 [place] scope, and the monopolarTauber forceps with sterile water irrigation, cold cup excisional biopsy was accomplished of the satellite lesion, with cauterization of the base of the tumor. No bleeding was noted. The tumor was sent to laboratory for examination, along with the tumor from the resected tissue. (As follows)  The 3 cm right bladder base lesion was then approached, by changing the irrigation fluid from water to saline. The Wolf continuous flow resectoscope was then placed, snf the 3 cm tumor was resected, with excellent bipolar cauterization of the tumor base. All tumor was sent to laboratory together. No bleeding was noted. However, because of the patient's anticoagulant status, I elected to place a size 20 hematuria catheter, which was placed  atraumatically. 30 mL of sterile water were placed in balloon. No traction was necessary. No continuous flow irrigation was necessary. The continuous flow port was capped. The patient was awakened after receiving IV  Tylenol, and taken to recovery room in good condition. He had previously received IV Ancef prior to the case.

## 2015-02-03 NOTE — Anesthesia Postprocedure Evaluation (Signed)
Anesthesia Post Note  Patient: James [name]  Procedure(s) Performed: Procedure(s) (LRB): CYSTOSCOPY WITH COLD CUP BLADDER BIOPSY CAUTHERIZAITON OF RIGHT BLADDER CANCER AND SATILITE, TURBT RIGHT BLADDER BASE (Right)  Anesthesia type: General  Patient location: PACU  Post pain: Pain level controlled  Post assessment: Post-op Vital signs reviewed  Last Vitals: BP 108/51 mmHg  Pulse 56  Temp(Src) 36.8 C (Oral)  Resp 14  Ht 5\' 11"  (1.803 m)  Wt 234 lb 6 oz (106.312 kg)  BMI 32.70 kg/m2  SpO2 97%  Post vital signs: Reviewed  Level of consciousness: sedated  Complications: No apparent anesthesia complications

## 2015-02-07 ENCOUNTER — Encounter (HOSPITAL_COMMUNITY): Payer: Self-pay | Admitting: Urology

## 2015-02-07 ENCOUNTER — Other Ambulatory Visit: Payer: Medicare Other

## 2015-02-07 NOTE — Discharge Summary (Signed)
Physician Discharge Summary  Patient ID: James [name] MRN: [id number] DOB/AGE: [birth year] 81 y.o.  Admit date: 01/25/2015 Discharge date: 01/26/15 Admission Diagnoses: Bladder Cancer Discharge Diagnoses: Bladder cancer Active Problems:   GI bleed   Acute GI bleeding   Anemia due to chronic blood loss   Discharged Condition: good  Hospital Course: TUR=BT  Significant Diagnostic Studies: No results found.  Discharge Exam: Blood pressure 108/42, pulse 52, temperature 97.9 F (36.6 C), temperature source Oral, resp. rate 16, height 5' 11.5" (1.816 m), weight 108.863 kg (240 lb), SpO2 98 %.   Disposition: 01-Home or Self Care  Discharge Instructions    Discharge instructions    Complete by:  As directed   Please have INR checked on Monday     Resume previous diet    Complete by:  As directed             Medication List    STOP taking these medications        clopidogrel 75 MG tablet  Commonly known as:  PLAVIX     multivitamin tablet     warfarin 5 MG tablet  Commonly known as:  COUMADIN      TAKE these medications        allopurinol 300 MG tablet  Commonly known as:  ZYLOPRIM  Take 0.5 tablets (150 mg total) by mouth every morning.     furosemide 40 MG tablet  Commonly known as:  LASIX  Take 80 mg by mouth every morning.     LIVALO 4 MG Tabs  Generic drug:  Pitavastatin Calcium  TAKE 1 TABLET AT BEDTIME     METAMUCIL PO  Take 20 mLs by mouth every evening.     nitroGLYCERIN 0.4 MG SL tablet  Commonly known as:  NITROSTAT  Place 1 tablet (0.4 mg total) under the tongue every 5 (five) minutes as needed. For chest pain     OSTEO BI-FLEX JOINT SHIELD PO  Take 2 tablets by mouth every morning.     OVER THE COUNTER MEDICATION  Place 2 drops into both eyes 4 (four) times daily. Wash type eye drop-     pantoprazole 40 MG tablet  Commonly known as:  PROTONIX  Take 40 mg by mouth at bedtime.     PROBIOTIC & ACIDOPHILUS EX ST PO  Take 1 tablet by mouth  every morning.     ranitidine 150 MG tablet  Commonly known as:  ZANTAC  Take 150 mg by mouth at bedtime.     spironolactone 25 MG tablet  Commonly known as:  ALDACTONE  Take 1 tablet (25 mg total) by mouth daily.     telmisartan 40 MG tablet  Commonly known as:  MICARDIS  Take 40 mg by mouth daily as needed (when BP is high).     Vitamin D3 2000 UNITS capsule  Take 2,000 Units by mouth every morning.           Follow-up Information    Follow up with Silvano Rusk, MD On 04/21/2015.   Specialty:  Gastroenterology   Why:  at 1:30pm   Contact information:   [address] [place] [address] [phone]       Signed: Carolan Clines I 02/07/2015, 7:35 PM

## 2015-02-13 ENCOUNTER — Other Ambulatory Visit (INDEPENDENT_AMBULATORY_CARE_PROVIDER_SITE_OTHER): Payer: Medicare Other

## 2015-02-13 DIAGNOSIS — D509 Iron deficiency anemia, unspecified: Secondary | ICD-10-CM

## 2015-02-13 DIAGNOSIS — Z86718 Personal history of other venous thrombosis and embolism: Secondary | ICD-10-CM

## 2015-02-13 DIAGNOSIS — I81 Portal vein thrombosis: Secondary | ICD-10-CM

## 2015-02-13 DIAGNOSIS — Z7901 Long term (current) use of anticoagulants: Secondary | ICD-10-CM

## 2015-02-13 LAB — FERRITIN: Ferritin: 18 ng/mL — ABNORMAL LOW (ref 22–322)

## 2015-02-13 LAB — COMPREHENSIVE METABOLIC PANEL
ALBUMIN: 3.6 g/dL (ref 3.5–5.2)
ALT: 37 U/L (ref 0–53)
AST: 23 U/L (ref 0–37)
Alkaline Phosphatase: 102 U/L (ref 39–117)
BILIRUBIN TOTAL: 0.5 mg/dL (ref 0.2–1.2)
BUN: 22 mg/dL (ref 6–23)
CHLORIDE: 104 meq/L (ref 96–112)
CO2: 23 mEq/L (ref 19–32)
CREATININE: 1.12 mg/dL (ref 0.50–1.35)
Calcium: 9.4 mg/dL (ref 8.4–10.5)
GLUCOSE: 121 mg/dL — AB (ref 70–99)
POTASSIUM: 4.3 meq/L (ref 3.5–5.3)
SODIUM: 140 meq/L (ref 135–145)
Total Protein: 6.2 g/dL (ref 6.0–8.3)

## 2015-02-13 LAB — CBC WITH DIFFERENTIAL/PLATELET
Basophils Absolute: 0.1 10*3/uL (ref 0.0–0.1)
Basophils Relative: 1 % (ref 0–1)
EOS ABS: 0.3 10*3/uL (ref 0.0–0.7)
EOS PCT: 3 % (ref 0–5)
HEMATOCRIT: 27.6 % — AB (ref 39.0–52.0)
HEMOGLOBIN: 8.4 g/dL — AB (ref 13.0–17.0)
LYMPHS PCT: 13 % (ref 12–46)
Lymphs Abs: 1.1 10*3/uL (ref 0.7–4.0)
MCH: 26.1 pg (ref 26.0–34.0)
MCHC: 30.4 g/dL (ref 30.0–36.0)
MCV: 85.7 fL (ref 78.0–100.0)
MONOS PCT: 12 % (ref 3–12)
MPV: 9.1 fL (ref 8.6–12.4)
Monocytes Absolute: 1 10*3/uL (ref 0.1–1.0)
Neutro Abs: 6.1 10*3/uL (ref 1.7–7.7)
Neutrophils Relative %: 71 % (ref 43–77)
PLATELETS: 371 10*3/uL (ref 150–400)
RBC: 3.22 MIL/uL — ABNORMAL LOW (ref 4.22–5.81)
RDW: 18.8 % — AB (ref 11.5–15.5)
WBC: 8.6 10*3/uL (ref 4.0–10.5)

## 2015-02-13 LAB — PROTIME-INR
INR: 1.15 (ref 0.00–1.49)
Prothrombin Time: 14.8 seconds (ref 11.6–15.2)

## 2015-02-14 ENCOUNTER — Telehealth: Payer: Self-pay | Admitting: *Deleted

## 2015-02-14 NOTE — Telephone Encounter (Signed)
Pt called/informed hgb 8.4 and need to schedule IV iron 2 weeks [facility] row [facility] Dr Beryle Beams. Pt stated he will be available this week - Thursday afternoon and all day Friday. Next week - Monday (29th) after his appt with you @ 0815AM, and then anytime rest of the week.

## 2015-02-14 NOTE — Telephone Encounter (Signed)
-----   Message from Annia Belt, MD sent at 02/14/2015  1:07 PM EST ----- Call pt: Hb 8.4.  We need to schedule [facility] a row [facility] - give [place] a day that works for him & I will schedule

## 2015-02-15 ENCOUNTER — Telehealth: Payer: Self-pay | Admitting: Oncology

## 2015-02-15 ENCOUNTER — Other Ambulatory Visit: Payer: Self-pay | Admitting: Oncology

## 2015-02-15 DIAGNOSIS — K921 Melena: Secondary | ICD-10-CM

## 2015-02-15 NOTE — Telephone Encounter (Signed)
s.w. pt and advised on iron appts....pt ok and aware of both appts

## 2015-02-16 ENCOUNTER — Ambulatory Visit (INDEPENDENT_AMBULATORY_CARE_PROVIDER_SITE_OTHER): Payer: Medicare Other | Admitting: Podiatry

## 2015-02-16 ENCOUNTER — Encounter: Payer: Self-pay | Admitting: Podiatry

## 2015-02-16 VITALS — BP 116/51 | HR 73 | Resp 14 | Ht 71.5 in | Wt 227.6 lb

## 2015-02-16 DIAGNOSIS — B351 Tinea unguium: Secondary | ICD-10-CM | POA: Diagnosis not present

## 2015-02-16 DIAGNOSIS — M79606 Pain in leg, unspecified: Secondary | ICD-10-CM | POA: Diagnosis not present

## 2015-02-16 NOTE — Progress Notes (Signed)
   Subjective:    Patient ID: James [name], male    DOB: October 27, 1928, 81 y.o.   MRN: [id number]  HPI    Review of Systems  Hematological:       Pt states he is often receiving blood transfusion and is scheduled fo one tomorrow.  All other systems reviewed and are negative.      Objective:   Physical Exam: I have reviewed his past medical history medications allergies. Pulses are palpable bilateral. His nails are thick yellow dystrophic onychomycotic and painful on palpation.         Assessment & Plan:  Assessment: Pain limb secondary to onychomycosis elongated sharply incurvated nails bilateral.  Plan: Debridement of nails 1 through 5 bilateral chrome service secondary to pain.

## 2015-02-17 ENCOUNTER — Ambulatory Visit (HOSPITAL_BASED_OUTPATIENT_CLINIC_OR_DEPARTMENT_OTHER): Payer: Medicare Other

## 2015-02-17 DIAGNOSIS — D500 Iron deficiency anemia secondary to blood loss (chronic): Secondary | ICD-10-CM

## 2015-02-17 DIAGNOSIS — K921 Melena: Secondary | ICD-10-CM

## 2015-02-17 DIAGNOSIS — K909 Intestinal malabsorption, unspecified: Secondary | ICD-10-CM

## 2015-02-17 DIAGNOSIS — D509 Iron deficiency anemia, unspecified: Secondary | ICD-10-CM

## 2015-02-17 DIAGNOSIS — D649 Anemia, unspecified: Secondary | ICD-10-CM

## 2015-02-17 MED ORDER — SODIUM CHLORIDE 0.9 % IV SOLN
INTRAVENOUS | Status: DC
  Administered 2015-02-17: 09:00:00 via INTRAVENOUS

## 2015-02-17 MED ORDER — SODIUM CHLORIDE 0.9 % IV SOLN
510.0000 mg | Freq: Once | INTRAVENOUS | Status: AC
  Administered 2015-02-17: 510 mg via INTRAVENOUS
  Filled 2015-02-17: qty 17

## 2015-02-17 NOTE — Progress Notes (Signed)
Patient refused to stay 30 post Feraheme infusion; ambulates well to bathroom; post vital signs stable; discharged home with no complaints of dizziness or distress.

## 2015-02-17 NOTE — Patient Instructions (Signed)

## 2015-02-20 ENCOUNTER — Ambulatory Visit (INDEPENDENT_AMBULATORY_CARE_PROVIDER_SITE_OTHER): Payer: Medicare Other | Admitting: Oncology

## 2015-02-20 ENCOUNTER — Encounter: Payer: Self-pay | Admitting: Oncology

## 2015-02-20 VITALS — BP 107/46 | HR 68 | Temp 97.5°F | Ht 71.5 in | Wt 235.9 lb

## 2015-02-20 DIAGNOSIS — I81 Portal vein thrombosis: Secondary | ICD-10-CM

## 2015-02-20 DIAGNOSIS — C4321 Malignant melanoma of right ear and external auricular canal: Secondary | ICD-10-CM

## 2015-02-20 DIAGNOSIS — Z95818 Presence of other cardiac implants and grafts: Secondary | ICD-10-CM

## 2015-02-20 DIAGNOSIS — Z7982 Long term (current) use of aspirin: Secondary | ICD-10-CM

## 2015-02-20 DIAGNOSIS — K766 Portal hypertension: Secondary | ICD-10-CM

## 2015-02-20 DIAGNOSIS — C679 Malignant neoplasm of bladder, unspecified: Secondary | ICD-10-CM

## 2015-02-20 DIAGNOSIS — I2510 Atherosclerotic heart disease of native coronary artery without angina pectoris: Secondary | ICD-10-CM

## 2015-02-20 DIAGNOSIS — Z7902 Long term (current) use of antithrombotics/antiplatelets: Secondary | ICD-10-CM

## 2015-02-20 DIAGNOSIS — Z86718 Personal history of other venous thrombosis and embolism: Secondary | ICD-10-CM

## 2015-02-20 DIAGNOSIS — D6959 Other secondary thrombocytopenia: Secondary | ICD-10-CM

## 2015-02-20 DIAGNOSIS — I5032 Chronic diastolic (congestive) heart failure: Secondary | ICD-10-CM

## 2015-02-20 DIAGNOSIS — Z85820 Personal history of malignant melanoma of skin: Secondary | ICD-10-CM

## 2015-02-20 DIAGNOSIS — D62 Acute posthemorrhagic anemia: Secondary | ICD-10-CM

## 2015-02-20 DIAGNOSIS — I8510 Secondary esophageal varices without bleeding: Secondary | ICD-10-CM

## 2015-02-20 DIAGNOSIS — K922 Gastrointestinal hemorrhage, unspecified: Secondary | ICD-10-CM

## 2015-02-20 LAB — CBC WITH DIFFERENTIAL/PLATELET
BASOS ABS: 0 10*3/uL (ref 0.0–0.1)
BASOS PCT: 0 % (ref 0–1)
EOS PCT: 1 % (ref 0–5)
Eosinophils Absolute: 0.1 10*3/uL (ref 0.0–0.7)
HEMATOCRIT: 25.9 % — AB (ref 39.0–52.0)
Hemoglobin: 7.7 g/dL — ABNORMAL LOW (ref 13.0–17.0)
Lymphocytes Relative: 10 % — ABNORMAL LOW (ref 12–46)
Lymphs Abs: 1.1 10*3/uL (ref 0.7–4.0)
MCH: 25.3 pg — AB (ref 26.0–34.0)
MCHC: 29.7 g/dL — AB (ref 30.0–36.0)
MCV: 85.2 fL (ref 78.0–100.0)
MPV: 9.4 fL (ref 8.6–12.4)
Monocytes Absolute: 1.3 10*3/uL — ABNORMAL HIGH (ref 0.1–1.0)
Monocytes Relative: 11 % (ref 3–12)
NEUTROS ABS: 8.9 10*3/uL — AB (ref 1.7–7.7)
Neutrophils Relative %: 78 % — ABNORMAL HIGH (ref 43–77)
Platelets: 471 10*3/uL — ABNORMAL HIGH (ref 150–400)
RBC: 3.04 MIL/uL — ABNORMAL LOW (ref 4.22–5.81)
RDW: 19.2 % — AB (ref 11.5–15.5)
WBC: 11.4 10*3/uL — ABNORMAL HIGH (ref 4.0–10.5)

## 2015-02-20 NOTE — Progress Notes (Signed)
Patient ID: James [name], male   DOB: Jul 09, 1928, 81 y.o.   MRN: [id number] Hematology and Oncology Follow Up Visit  James [name] [id number] 1928/04/25 81 y.o. 02/20/2015 8:59 AM   Principle Diagnosis: Encounter Diagnoses  Name Primary?  . Encounter for long-term (current) use of antiplatelets/antithrombotics Yes  . Acute GI bleeding   . Portal vein thrombosis   . Malignant melanoma of ear, right    clinical summary: 81 year old man initially diagnosed with ITP in 2010, likely drug related (Septra) which completely resolved with a short course of steroids and discontinuation of medication. I saw him in December 2014 after he sustained a left lower extremity DVT in the posterior tibial and peroneal veins documented on a Doppler study done 11/01/2013. He was anticoagulated for 6 months with Xarelto.  I saw him again on 06/23/2014 when he was admitted with a sudden unexplained fall in his hemoglobin from baseline 13 g in December 2014 down to 7.9 g at time of 05/29/2014 admission. This appeared to be iron deficiency with a low ferritin. No gross evidence for acute hemorrhage but it was suspected that he may have had a subacute bleed while on the Xarelto which had recently been stopped. He was given one unit of blood. He was started on iron.  He underwent a cardiac evaluation for progressive dyspnea and echocardiogram findings of diastolic dysfunction. He had a 60-70% proximal LAD lesion on cardiac catheterization June 22, 2014 and a drug eluting stent was placed. He was put on aspirin and Plavix. The Xarelto was discontinued.   He subsequently underwent outpatient upper endoscopy and colonoscopy on August 17. He was unexpectedly found to have mid and distal esophageal varices and evidence of early portal hypertension. 2 tiny sessile polyps noted in the cecum and ascending colon which were not removed due to his Plavix. No malignant changes in the colon or upper aerodigestive tract.  In view of  the endoscopy findings, he underwent an MRI scan of his liver on 08/20/2014. There were vascular abnormalities in the area of the right portal vein but no parenchymal lesions in the liver and no other morphologic changes that would be typically seen with cirrhosis. There was fatty atrophy of the pancreas but fatty changes in the liver were not mentioned. This study was followed with a noninvasive color and duplex Doppler ultrasound of the liver done on September 4 which showed complete occlusion of the main, right, and left portal veins. Splenic vein was patent. Of note at no time did the patient have any abdominal pain.  Initial impression was that the portal vein thrombosis was chronic however I obtained a d-dimer study   significantly elevated at 6.08 units. It was 3.13 units at time of his left lower extremity DVT but had normalized down to 0.35 units via 05/05/2014. I got additional history from his daughter today who reports that one of the patient's sisters also had portal vein thrombosis and another sister has also had problems with blood clots. He only had a partial coagulation evaluation evaluation back in January to rule out presence of antiphospholipid antibodies.  He was started on warfarin but continued to have episodic significant falls in his hemoglobin. Aspirin was stopped. Target INR was decreased to minimize bleeding but still give him protection against clotting. He had repeat upper  endoscopy on January 25 2015 which were unrevealing for a site of bleeding. He underwent capsule endoscopy. First procedure was negative. Second procedure suggested a point of bleeding at  the terminal ileum. He had repeat colonoscopy on February 5. No gross point of bleeding was observed.  He developed gross hematuria and there appeared to be a filling defect in his bladder on CT scan. I elected to stop all anticoagulation.  Interim History:   He proceeded with cystoscopy by Dr. Gaynelle Arabian with findings  of a tumor in his bladder, left lateral wall. He underwent transurethral resection on February 12. He was found to have a noninvasive low-grade papillary urothelial malignancy. I believe that  Intravesical Mitomycin C chemotherapy treatment is planned for the near future. He has not seen any more blood in his urine or stool since stopping warfarin. He is back on both aspirin and Plavix. He continues to have dyspnea with mild exertion. He denied any chest pain.   Medications: reviewed  Allergies:  Allergies  Allergen Reactions  . Sulfa Antibiotics Other (See Comments)    Caused patient have ITP  . Aleve [Naproxen Sodium] Other (See Comments)    Causes platlet drop  . Rofecoxib Other (See Comments)    Celebrex  - affected low platelets  . Sulfamethoxazole-Trimethoprim Other (See Comments)     Causes low platelets and bleeding  . Tape Other (See Comments)    Adhesive tape makes skin tear.  . Tramadol Hcl Other (See Comments)    dizziness, drowsiness  . Neomycin-Bacitracin Zn-Polymyx Rash  . Neosporin [Neomycin-Polymyxin-Gramicidin] Rash  . Penicillins Swelling and Rash    Took dose of amoxicillin 2000mg  02/02/15 at dentist office without any complications    Review of Systems: See history of present illness  Remaining ROS negative:   Physical Exam: Blood pressure 107/46, pulse 68, temperature 97.5 F (36.4 C), temperature source Oral, height 5' 11.5" (1.816 m), weight 235 lb 14.4 oz (107.004 kg), SpO2 98 %. Wt Readings from Last 3 Encounters:  02/20/15 235 lb 14.4 oz (107.004 kg)  02/16/15 227 lb 9.6 oz (103.239 kg)  02/03/15 234 lb 6 oz (106.312 kg)     General appearance: Pale, chronically ill appearing, Caucasian man. He appears mildly dyspneic at rest. HENNT: Pharynx no erythema, exudate, mass, or ulcer. No thyromegaly or thyroid nodules. Surgical changes right earlobe from previous excision of melanoma. Lymph nodes: No cervical, supraclavicular, or axillary  lymphadenopathy Breasts:  Lungs: Clear to auscultation, resonant to percussion throughout Heart: Regular rhythm, no murmur, no gallop, no rub, no click, no edema Abdomen: Soft, nontender, normal bowel sounds, no mass, no organomegaly Extremities: No edema, no calf tenderness Musculoskeletal: no joint deformities GU:  Vascular: Carotid pulses 2+, no bruits, Neurologic: Alert, oriented, PERRLA,, cranial nerves grossly normal, motor strength 5 over 5, reflexes 1+ symmetric, upper body coordination normal, gait normal, Skin: No rash or ecchymosis  Lab Results: CBC W/Diff    Component Value Date/Time   WBC 8.6 02/13/2015 0925   WBC 7.6 11/24/2013 0812   RBC 3.22* 02/13/2015 0925   RBC 3.18* 11/14/2014 1132   RBC 4.28 11/24/2013 0812   HGB 8.4* 02/13/2015 0925   HGB 13.0 11/24/2013 0812   HCT 27.6* 02/13/2015 0925   HCT 40.3 11/24/2013 0812   PLT 371 02/13/2015 0925   PLT 271 11/24/2013 0812   MCV 85.7 02/13/2015 0925   MCV 94.2 11/24/2013 0812   MCH 26.1 02/13/2015 0925   MCH 30.4 11/24/2013 0812   MCHC 30.4 02/13/2015 0925   MCHC 32.3 11/24/2013 0812   RDW 18.8* 02/13/2015 0925   RDW 14.7* 11/24/2013 0812   LYMPHSABS 1.1 02/13/2015 0925   LYMPHSABS 1.9  11/24/2013 0812   MONOABS 1.0 02/13/2015 0925   MONOABS 0.4 11/24/2013 0812   EOSABS 0.3 02/13/2015 0925   EOSABS 0.2 11/24/2013 0812   BASOSABS 0.1 02/13/2015 0925   BASOSABS 0.0 11/24/2013 0812     Chemistry      Component Value Date/Time   NA 140 02/13/2015 0925   NA 141 11/24/2013 0813   K 4.3 02/13/2015 0925   K 3.8 11/24/2013 0813   CL 104 02/13/2015 0925   CO2 23 02/13/2015 0925   CO2 23 11/24/2013 0813   BUN 22 02/13/2015 0925   BUN 12.3 11/24/2013 0813   CREATININE 1.12 02/13/2015 0925   CREATININE 1.30 01/29/2015 1545   CREATININE 0.9 11/24/2013 0813      Component Value Date/Time   CALCIUM 9.4 02/13/2015 0925   CALCIUM 9.5 11/24/2013 0813   ALKPHOS 102 02/13/2015 0925   ALKPHOS 87 11/24/2013 0813    AST 23 02/13/2015 0925   AST 25 11/24/2013 0813   ALT 37 02/13/2015 0925   ALT 26 11/24/2013 0813   BILITOT 0.5 02/13/2015 0925   BILITOT 0.57 11/24/2013 0813       Radiological Studies: No results found.  Impression:  #1. Idiopathic coagulopathy status post left lower extremity DVT status post portal vein thrombosis In view of significant recurrent bleeding from the GI and GU tract, I have elected to stop anticoagulation. He is now back on double antiplatelet therapy with aspirin and Plavix.  #2. Low-grade, noninvasive, papillary urothelial cancer of the bladder Hopefully now that this has been resected we will not see any further hematuria. Further management per urology.  #3. Coronary artery disease status post drug eluting stent to the LAD 06/22/14  #4. Chronic diastolic heart failure  #5. Iron deficiency anemia related to recent blood loss with likely component of iron malabsorption as well as  #6. Remote resection melanoma pinna right ear no obvious recurrence.  #7. Drug-related thrombocytopenia: Septra in 2010 resolved with discontinuation of medication and short course of steroids  #8. Portal hypertension with distal esophageal varices related to subacute portal vein thrombosis   CC: Patient Care Team: Tonia Ghent, MD as PCP - General [name], MD as Consulting Physician (Pulmonary Disease) Larey Dresser, MD as Consulting Physician (Cardiology) Annia Belt, MD as Consulting Physician (Oncology) Gatha Mayer, MD as Consulting Physician (Gastroenterology)   Annia Belt, MD 2/29/20168:59 AM

## 2015-02-20 NOTE — Patient Instructions (Addendum)
To lab today Continue CBC every month Take iron pills twice daily Return visit with Dr Darnell Level 3-4 months

## 2015-02-21 ENCOUNTER — Other Ambulatory Visit: Payer: Self-pay | Admitting: Oncology

## 2015-02-21 ENCOUNTER — Telehealth: Payer: Self-pay | Admitting: Oncology

## 2015-02-21 ENCOUNTER — Telehealth: Payer: Self-pay | Admitting: *Deleted

## 2015-02-21 DIAGNOSIS — D500 Iron deficiency anemia secondary to blood loss (chronic): Secondary | ICD-10-CM

## 2015-02-21 NOTE — Telephone Encounter (Signed)
Labs added for 03/03 per 03/01 for type and cross and sent msg to add blood with iron for 03/04 and ask chemo scheduler to please contact pt concerning these visit and that pt needs to be aware that he is getting iron infusion and blood transfusion same day and labs day before.... KJ

## 2015-02-21 NOTE — Telephone Encounter (Signed)
I have called and left the patient a message with appts

## 2015-02-23 ENCOUNTER — Ambulatory Visit (HOSPITAL_COMMUNITY)
Admission: RE | Admit: 2015-02-23 | Discharge: 2015-02-23 | Disposition: A | Discharge status: Home / Self Care | Payer: Medicare Other | Source: Ambulatory Visit | Attending: Oncology | Admitting: Oncology

## 2015-02-23 ENCOUNTER — Other Ambulatory Visit (HOSPITAL_BASED_OUTPATIENT_CLINIC_OR_DEPARTMENT_OTHER): Payer: Medicare Other

## 2015-02-23 DIAGNOSIS — K922 Gastrointestinal hemorrhage, unspecified: Secondary | ICD-10-CM

## 2015-02-23 DIAGNOSIS — D509 Iron deficiency anemia, unspecified: Secondary | ICD-10-CM

## 2015-02-23 DIAGNOSIS — D500 Iron deficiency anemia secondary to blood loss (chronic): Secondary | ICD-10-CM

## 2015-02-23 DIAGNOSIS — D6959 Other secondary thrombocytopenia: Secondary | ICD-10-CM

## 2015-02-23 DIAGNOSIS — C679 Malignant neoplasm of bladder, unspecified: Secondary | ICD-10-CM

## 2015-02-23 DIAGNOSIS — D62 Acute posthemorrhagic anemia: Secondary | ICD-10-CM

## 2015-02-23 LAB — COMPREHENSIVE METABOLIC PANEL (CC13)
ALK PHOS: 121 U/L (ref 40–150)
ALT: 29 U/L (ref 0–55)
AST: 20 U/L (ref 5–34)
Albumin: 2.9 g/dL — ABNORMAL LOW (ref 3.5–5.0)
Anion Gap: 12 mEq/L — ABNORMAL HIGH (ref 3–11)
BUN: 21.1 mg/dL (ref 7.0–26.0)
CO2: 20 mEq/L — ABNORMAL LOW (ref 22–29)
Calcium: 9.4 mg/dL (ref 8.4–10.4)
Chloride: 103 mEq/L (ref 98–109)
Creatinine: 1.1 mg/dL (ref 0.7–1.3)
EGFR: 61 mL/min/{1.73_m2} — ABNORMAL LOW (ref >90)
GLUCOSE: 155 mg/dL — AB (ref 70–140)
Potassium: 4.1 mEq/L (ref 3.5–5.1)
Sodium: 135 mEq/L — ABNORMAL LOW (ref 136–145)
Total Bilirubin: 0.72 mg/dL (ref 0.20–1.20)
Total Protein: 6.4 g/dL (ref 6.4–8.3)

## 2015-02-23 LAB — CBC WITH DIFFERENTIAL/PLATELET
BASO%: 0.3 % (ref 0.0–2.0)
Basophils Absolute: 0 10*3/uL (ref 0.0–0.1)
EOS%: 2.2 % (ref 0.0–7.0)
Eosinophils Absolute: 0.2 10*3/uL (ref 0.0–0.5)
HCT: 27.2 % — ABNORMAL LOW (ref 38.4–49.9)
HGB: 8.5 g/dL — ABNORMAL LOW (ref 13.0–17.1)
LYMPH%: 9.8 % — AB (ref 14.0–49.0)
MCH: 26.5 pg — ABNORMAL LOW (ref 27.2–33.4)
MCHC: 31.3 g/dL — AB (ref 32.0–36.0)
MCV: 84.7 fL (ref 79.3–98.0)
MONO#: 1.3 10*3/uL — ABNORMAL HIGH (ref 0.1–0.9)
MONO%: 13.3 % (ref 0.0–14.0)
NEUT#: 7.1 10*3/uL — ABNORMAL HIGH (ref 1.5–6.5)
NEUT%: 74.4 % (ref 39.0–75.0)
Platelets: 378 10*3/uL (ref 140–400)
RBC: 3.21 10*6/uL — AB (ref 4.20–5.82)
RDW: 20 % — ABNORMAL HIGH (ref 11.0–14.6)
WBC: 9.6 10*3/uL (ref 4.0–10.3)
lymph#: 0.9 10*3/uL (ref 0.9–3.3)

## 2015-02-23 LAB — FERRITIN CHCC: Ferritin: 327 ng/ml — ABNORMAL HIGH (ref 22–316)

## 2015-02-23 LAB — HOLD TUBE, BLOOD BANK

## 2015-02-24 ENCOUNTER — Ambulatory Visit (HOSPITAL_BASED_OUTPATIENT_CLINIC_OR_DEPARTMENT_OTHER): Payer: Medicare Other

## 2015-02-24 VITALS — BP 105/47 | HR 55 | Temp 97.9°F | Resp 18

## 2015-02-24 DIAGNOSIS — D500 Iron deficiency anemia secondary to blood loss (chronic): Secondary | ICD-10-CM | POA: Diagnosis not present

## 2015-02-24 DIAGNOSIS — K921 Melena: Secondary | ICD-10-CM

## 2015-02-24 DIAGNOSIS — D509 Iron deficiency anemia, unspecified: Secondary | ICD-10-CM

## 2015-02-24 LAB — PREPARE RBC (CROSSMATCH)

## 2015-02-24 MED ORDER — FUROSEMIDE 10 MG/ML IJ SOLN
20.0000 mg | Freq: Once | INTRAMUSCULAR | Status: AC
  Administered 2015-02-24: 20 mg via INTRAVENOUS

## 2015-02-24 MED ORDER — SODIUM CHLORIDE 0.9 % IV SOLN
250.0000 mL | Freq: Once | INTRAVENOUS | Status: AC
  Administered 2015-02-24: 250 mL via INTRAVENOUS

## 2015-02-24 MED ORDER — SODIUM CHLORIDE 0.9 % IV SOLN
510.0000 mg | Freq: Once | INTRAVENOUS | Status: AC
  Administered 2015-02-24: 510 mg via INTRAVENOUS
  Filled 2015-02-24: qty 17

## 2015-02-24 NOTE — Progress Notes (Signed)
Patient refused to stay for the 30 minutes post feraheme observation. Patient discharged ambulatory in no acute distress.

## 2015-02-24 NOTE — Patient Instructions (Signed)
Blood Transfusion Information WHAT IS A BLOOD TRANSFUSION? A transfusion is the replacement of blood or some of its parts. Blood is made up of multiple cells which provide different functions.  Red blood cells carry oxygen and are used for blood loss replacement.  White blood cells fight against infection.  Platelets control bleeding.  Plasma helps clot blood.  Other blood products are available for specialized needs, such as hemophilia or other clotting disorders. BEFORE THE TRANSFUSION  Who gives blood for transfusions?   You may be able to donate blood to be used at a later date on yourself (autologous donation).  Relatives can be asked to donate blood. This is generally not any safer than if you have received blood from a stranger. The same precautions are taken to ensure safety when a relative's blood is donated.  Healthy volunteers who are fully evaluated to make sure their blood is safe. This is blood bank blood. Transfusion therapy is the safest it has ever been in the practice of medicine. Before blood is taken from a donor, a complete history is taken to make sure that person has no history of diseases nor engages in risky social behavior (examples are intravenous drug use or sexual activity with multiple partners). The donor's travel history is screened to minimize risk of transmitting infections, such as malaria. The donated blood is tested for signs of infectious diseases, such as HIV and hepatitis. The blood is then tested to be sure it is compatible with you in order to minimize the chance of a transfusion reaction. If you or a relative donates blood, this is often done in anticipation of surgery and is not appropriate for emergency situations. It takes many days to process the donated blood. RISKS AND COMPLICATIONS Although transfusion therapy is very safe and saves many lives, the main dangers of transfusion include:   Getting an infectious disease.  Developing a  transfusion reaction. This is an allergic reaction to something in the blood you were given. Every precaution is taken to prevent this. The decision to have a blood transfusion has been considered carefully by your caregiver before blood is given. Blood is not given unless the benefits outweigh the risks. AFTER THE TRANSFUSION  Right after receiving a blood transfusion, you will usually feel much better and more energetic. This is especially true if your red blood cells have gotten low (anemic). The transfusion raises the level of the red blood cells which carry oxygen, and this usually causes an energy increase.  The nurse administering the transfusion will monitor you carefully for complications. HOME CARE INSTRUCTIONS  No special instructions are needed after a transfusion. You may find your energy is better. Speak with your caregiver about any limitations on activity for underlying diseases you may have. SEEK MEDICAL CARE IF:   Your condition is not improving after your transfusion.  You develop redness or irritation at the intravenous (IV) site. SEEK IMMEDIATE MEDICAL CARE IF:  Any of the following symptoms occur over the next 12 hours:  Shaking chills.  You have a temperature by mouth above 102 F (38.9 C), not controlled by medicine.  Chest, back, or muscle pain.  People around you feel you are not acting correctly or are confused.  Shortness of breath or difficulty breathing.  Dizziness and fainting.  You get a rash or develop hives.  You have a decrease in urine output.  Your urine turns a dark color or changes to pink, red, or brown. Any of the following   symptoms occur over the next 10 days:  You have a temperature by mouth above 102 F (38.9 C), not controlled by medicine.  Shortness of breath.  Weakness after normal activity.  The white part of the eye turns yellow (jaundice).  You have a decrease in the amount of urine or are urinating less often.  Your  urine turns a dark color or changes to pink, red, or brown. Document Released: 12/06/2000 Document Revised: 03/02/2012 Document Reviewed: 07/25/2008 [facility] Patient Information 2015 [place], [place]. This information is not intended to replace advice given to you by your health care provider. Make sure you discuss any questions you have with your health care provider.    Ferumoxytol injection (Feraheme) What is this medicine? FERUMOXYTOL is an iron complex. Iron is used to make healthy red blood cells, which carry oxygen and nutrients throughout the body. This medicine is used to treat iron deficiency anemia in people with chronic kidney disease. This medicine may be used for other purposes; ask your health care provider or pharmacist if you have questions. COMMON BRAND NAME(S): Feraheme What should I tell my health care provider before I take this medicine? They need to know if you have any of these conditions: -anemia not caused by low iron levels -high levels of iron in the blood -magnetic resonance imaging (MRI) test scheduled -an unusual or allergic reaction to iron, other medicines, foods, dyes, or preservatives -pregnant or trying to get pregnant -breast-feeding How should I use this medicine? This medicine is for injection into a vein. It is given by a health care professional in a hospital or clinic setting. Talk to your pediatrician regarding the use of this medicine in children. Special care may be needed. Overdosage: If you think you've taken too much of this medicine contact a poison control center or emergency room at once. Overdosage: If you think you have taken too much of this medicine contact a poison control center or emergency room at once. NOTE: This medicine is only for you. Do not share this medicine with others. What if I miss a dose? It is important not to miss your dose. Call your doctor or health care professional if you are unable to keep an appointment. What  may interact with this medicine? This medicine may interact with the following medications: -other iron products This list may not describe all possible interactions. Give your health care provider a list of all the medicines, herbs, non-prescription drugs, or dietary supplements you use. Also tell them if you smoke, drink alcohol, or use illegal drugs. Some items may interact with your medicine. What should I watch for while using this medicine? Visit your doctor or healthcare professional regularly. Tell your doctor or healthcare professional if your symptoms do not start to get better or if they get worse. You may need blood work done while you are taking this medicine. You may need to follow a special diet. Talk to your doctor. Foods that contain iron include: whole grains/cereals, dried fruits, beans, or peas, leafy green vegetables, and organ meats (liver, kidney). What side effects may I notice from receiving this medicine? Side effects that you should report to your doctor or health care professional as soon as possible: -allergic reactions like skin rash, itching or hives, swelling of the face, lips, or tongue -breathing problems -changes in blood pressure -feeling faint or lightheaded, falls -fever or chills -flushing, sweating, or hot feelings -swelling of the ankles or feet Side effects that usually do not require medical  attention (Report these to your doctor or health care professional if they continue or are bothersome.): -diarrhea -headache -nausea, vomiting -stomach pain This list may not describe all possible side effects. Call your doctor for medical advice about side effects. You may report side effects to FDA at 1-800-FDA-1088. Where should I keep my medicine? This drug is given in a hospital or clinic and will not be stored at home. NOTE: This sheet is a summary. It may not cover all possible information. If you have questions about this medicine, talk to your doctor,  pharmacist, or health care provider.  2015, [employer]. (2012-07-24 15:23:36)

## 2015-02-27 LAB — TYPE AND SCREEN
ABO/RH(D): A NEG
ANTIBODY SCREEN: NEGATIVE
UNIT DIVISION: 0
Unit division: 0
Unit division: 0
Unit division: 0

## 2015-02-28 ENCOUNTER — Encounter (HOSPITAL_COMMUNITY): Payer: Self-pay

## 2015-02-28 ENCOUNTER — Ambulatory Visit (HOSPITAL_BASED_OUTPATIENT_CLINIC_OR_DEPARTMENT_OTHER)
Admission: RE | Admit: 2015-02-28 | Discharge: 2015-02-28 | Disposition: A | Discharge status: Home / Self Care | Payer: Medicare Other | Source: Ambulatory Visit | Attending: Cardiology | Admitting: Cardiology

## 2015-02-28 VITALS — BP 134/62 | HR 67 | Wt 230.2 lb

## 2015-02-28 DIAGNOSIS — Z8249 Family history of ischemic heart disease and other diseases of the circulatory system: Secondary | ICD-10-CM | POA: Diagnosis not present

## 2015-02-28 DIAGNOSIS — I6529 Occlusion and stenosis of unspecified carotid artery: Secondary | ICD-10-CM

## 2015-02-28 DIAGNOSIS — R110 Nausea: Secondary | ICD-10-CM | POA: Diagnosis not present

## 2015-02-28 DIAGNOSIS — K449 Diaphragmatic hernia without obstruction or gangrene: Secondary | ICD-10-CM | POA: Diagnosis not present

## 2015-02-28 DIAGNOSIS — I81 Portal vein thrombosis: Secondary | ICD-10-CM | POA: Diagnosis not present

## 2015-02-28 DIAGNOSIS — Z96652 Presence of left artificial knee joint: Secondary | ICD-10-CM | POA: Diagnosis not present

## 2015-02-28 DIAGNOSIS — Z955 Presence of coronary angioplasty implant and graft: Secondary | ICD-10-CM | POA: Insufficient documentation

## 2015-02-28 DIAGNOSIS — Z79899 Other long term (current) drug therapy: Secondary | ICD-10-CM | POA: Diagnosis not present

## 2015-02-28 DIAGNOSIS — Z882 Allergy status to sulfonamides status: Secondary | ICD-10-CM | POA: Diagnosis not present

## 2015-02-28 DIAGNOSIS — K5900 Constipation, unspecified: Secondary | ICD-10-CM | POA: Diagnosis not present

## 2015-02-28 DIAGNOSIS — Z888 Allergy status to other drugs, medicaments and biological substances status: Secondary | ICD-10-CM | POA: Diagnosis not present

## 2015-02-28 DIAGNOSIS — E780 Pure hypercholesterolemia: Secondary | ICD-10-CM | POA: Diagnosis not present

## 2015-02-28 DIAGNOSIS — R066 Hiccough: Secondary | ICD-10-CM | POA: Diagnosis not present

## 2015-02-28 DIAGNOSIS — E039 Hypothyroidism, unspecified: Secondary | ICD-10-CM | POA: Diagnosis not present

## 2015-02-28 DIAGNOSIS — I5032 Chronic diastolic (congestive) heart failure: Secondary | ICD-10-CM

## 2015-02-28 DIAGNOSIS — Z86718 Personal history of other venous thrombosis and embolism: Secondary | ICD-10-CM

## 2015-02-28 DIAGNOSIS — E43 Unspecified severe protein-calorie malnutrition: Secondary | ICD-10-CM | POA: Diagnosis not present

## 2015-02-28 DIAGNOSIS — Z85828 Personal history of other malignant neoplasm of skin: Secondary | ICD-10-CM | POA: Diagnosis not present

## 2015-02-28 DIAGNOSIS — R109 Unspecified abdominal pain: Secondary | ICD-10-CM | POA: Insufficient documentation

## 2015-02-28 DIAGNOSIS — M109 Gout, unspecified: Secondary | ICD-10-CM | POA: Diagnosis not present

## 2015-02-28 DIAGNOSIS — K766 Portal hypertension: Secondary | ICD-10-CM | POA: Diagnosis not present

## 2015-02-28 DIAGNOSIS — I472 Ventricular tachycardia: Secondary | ICD-10-CM | POA: Diagnosis not present

## 2015-02-28 DIAGNOSIS — Z87442 Personal history of urinary calculi: Secondary | ICD-10-CM | POA: Diagnosis not present

## 2015-02-28 DIAGNOSIS — R590 Localized enlarged lymph nodes: Secondary | ICD-10-CM | POA: Diagnosis not present

## 2015-02-28 DIAGNOSIS — C679 Malignant neoplasm of bladder, unspecified: Secondary | ICD-10-CM | POA: Insufficient documentation

## 2015-02-28 DIAGNOSIS — I10 Essential (primary) hypertension: Secondary | ICD-10-CM

## 2015-02-28 DIAGNOSIS — E559 Vitamin D deficiency, unspecified: Secondary | ICD-10-CM | POA: Diagnosis not present

## 2015-02-28 DIAGNOSIS — R0600 Dyspnea, unspecified: Secondary | ICD-10-CM

## 2015-02-28 DIAGNOSIS — E669 Obesity, unspecified: Secondary | ICD-10-CM

## 2015-02-28 DIAGNOSIS — Z86010 Personal history of colonic polyps: Secondary | ICD-10-CM | POA: Diagnosis not present

## 2015-02-28 DIAGNOSIS — Z881 Allergy status to other antibiotic agents status: Secondary | ICD-10-CM | POA: Diagnosis not present

## 2015-02-28 DIAGNOSIS — G8929 Other chronic pain: Secondary | ICD-10-CM | POA: Diagnosis not present

## 2015-02-28 DIAGNOSIS — Z87891 Personal history of nicotine dependence: Secondary | ICD-10-CM

## 2015-02-28 DIAGNOSIS — Z6830 Body mass index (BMI) 30.0-30.9, adult: Secondary | ICD-10-CM | POA: Diagnosis not present

## 2015-02-28 DIAGNOSIS — G893 Neoplasm related pain (acute) (chronic): Secondary | ICD-10-CM | POA: Diagnosis not present

## 2015-02-28 DIAGNOSIS — Z7982 Long term (current) use of aspirin: Secondary | ICD-10-CM | POA: Insufficient documentation

## 2015-02-28 DIAGNOSIS — K219 Gastro-esophageal reflux disease without esophagitis: Secondary | ICD-10-CM | POA: Insufficient documentation

## 2015-02-28 DIAGNOSIS — E785 Hyperlipidemia, unspecified: Secondary | ICD-10-CM | POA: Insufficient documentation

## 2015-02-28 DIAGNOSIS — Z800 Family history of malignant neoplasm of digestive organs: Secondary | ICD-10-CM | POA: Diagnosis not present

## 2015-02-28 DIAGNOSIS — I2510 Atherosclerotic heart disease of native coronary artery without angina pectoris: Secondary | ICD-10-CM | POA: Insufficient documentation

## 2015-02-28 DIAGNOSIS — C170 Malignant neoplasm of duodenum: Secondary | ICD-10-CM | POA: Diagnosis not present

## 2015-02-28 DIAGNOSIS — M170 Bilateral primary osteoarthritis of knee: Secondary | ICD-10-CM | POA: Diagnosis not present

## 2015-02-28 DIAGNOSIS — D500 Iron deficiency anemia secondary to blood loss (chronic): Secondary | ICD-10-CM | POA: Diagnosis not present

## 2015-02-28 DIAGNOSIS — G4733 Obstructive sleep apnea (adult) (pediatric): Secondary | ICD-10-CM | POA: Insufficient documentation

## 2015-02-28 DIAGNOSIS — J449 Chronic obstructive pulmonary disease, unspecified: Secondary | ICD-10-CM | POA: Diagnosis not present

## 2015-02-28 DIAGNOSIS — R350 Frequency of micturition: Secondary | ICD-10-CM | POA: Diagnosis not present

## 2015-02-28 DIAGNOSIS — Z880 Allergy status to penicillin: Secondary | ICD-10-CM | POA: Diagnosis not present

## 2015-02-28 DIAGNOSIS — D693 Immune thrombocytopenic purpura: Secondary | ICD-10-CM | POA: Diagnosis not present

## 2015-02-28 DIAGNOSIS — K31811 Angiodysplasia of stomach and duodenum with bleeding: Secondary | ICD-10-CM

## 2015-02-28 DIAGNOSIS — Z885 Allergy status to narcotic agent status: Secondary | ICD-10-CM | POA: Diagnosis not present

## 2015-02-28 DIAGNOSIS — Z7902 Long term (current) use of antithrombotics/antiplatelets: Secondary | ICD-10-CM

## 2015-02-28 DIAGNOSIS — M545 Low back pain: Secondary | ICD-10-CM | POA: Diagnosis not present

## 2015-02-28 DIAGNOSIS — N390 Urinary tract infection, site not specified: Secondary | ICD-10-CM | POA: Diagnosis not present

## 2015-02-28 DIAGNOSIS — D509 Iron deficiency anemia, unspecified: Secondary | ICD-10-CM | POA: Diagnosis not present

## 2015-02-28 DIAGNOSIS — N401 Enlarged prostate with lower urinary tract symptoms: Secondary | ICD-10-CM | POA: Diagnosis not present

## 2015-02-28 DIAGNOSIS — R319 Hematuria, unspecified: Secondary | ICD-10-CM

## 2015-02-28 DIAGNOSIS — I8500 Esophageal varices without bleeding: Secondary | ICD-10-CM | POA: Diagnosis not present

## 2015-02-28 DIAGNOSIS — G629 Polyneuropathy, unspecified: Secondary | ICD-10-CM | POA: Diagnosis not present

## 2015-02-28 DIAGNOSIS — D649 Anemia, unspecified: Secondary | ICD-10-CM | POA: Insufficient documentation

## 2015-02-28 DIAGNOSIS — R1900 Intra-abdominal and pelvic swelling, mass and lump, unspecified site: Secondary | ICD-10-CM | POA: Diagnosis present

## 2015-02-28 DIAGNOSIS — Z91048 Other nonmedicinal substance allergy status: Secondary | ICD-10-CM | POA: Diagnosis not present

## 2015-02-28 DIAGNOSIS — R3915 Urgency of urination: Secondary | ICD-10-CM | POA: Diagnosis not present

## 2015-02-28 DIAGNOSIS — C787 Secondary malignant neoplasm of liver and intrahepatic bile duct: Secondary | ICD-10-CM | POA: Diagnosis not present

## 2015-02-28 MED ORDER — FUROSEMIDE 40 MG PO TABS: 40.0000 mg | ORAL_TABLET | Freq: Every day | ORAL | Status: DC

## 2015-02-28 NOTE — Progress Notes (Signed)
Patient ID: STACI CARVER, male   DOB: November 04, 1928, 81 y.o.   MRN: [id number] PCP: Dr. Damita Dunnings Pulmonologist: Dr. Joya Gaskins Hematologist: Dr. Beryle Beams  81 yo with history of nonobstructive CAD and chronic diastolic CHF presents for cardiology followup.   In 11/14, he had a left leg DVT.  Only trigger seems to have been that he sat for 2 days in a row in a theater taking a test for the [facility] prior to the DVT.  He was on Xarelto for 6 months.     Patient has been on Lasix 80 mg daily for several years now.  He has chronic exertional dyspnea that waxes and wanes. He had an echo in 12/14 with EF 60-65% and aortic sclerosis without significant stenosis.  He was admitted in 6/15 with dyspnea. CTA chest was negative for PE.  Lower extremity dopplers showed a superficial thrombus but no DVT. He was found to be anemic with hemoglobin 7.9.  He got 1 unit PRBCs and IV iron.  He also was diuresed with IV Lasix.  He says that he really did not feel much better.  He did not have overt GI bleeding.  Lexiscan Cardiolite was done in 6/15, showing basal to mid inferior mild ischemia (overall low risk study).   Given ongoing significant exertional symptoms, I took him for [facility] in 7/15.  RHC showed normal filling pressures and cardiac output.  LHC showed a borderline LAD stenosis.  FFR was done, suggesting that the stenosis was hemodynamically significant.  Patient had DES to LAD.  He saw Dr. Beryle Beams shortly thereafter, and it was decided that he was low risk for future DVT so Xarelto was not started.   After the PCI, patient did not note any significant change to his chronic exertional dyspnea. He was diagnosed with portal vein thrombosis in 9/15 and started on coumadin.  He then had hematochezia and was admitted to [facility] later in 9/15.  Possible small bowel AVMs.  Capsule endoscopy was negative.  Last CT abdomen in 10/15 showed small ascites and subtotal portal vein occlusion.  He did not have cirrhosis.    He has continued to have suspected slow GI blood loss possibly from small bowel AVMs.  EGD in 2/16 showed no bleeding site and capsule endoscopy in 2/16 showed a site of bleeding in the terminal ileium.  Around Thanksgiving, he developed gross hematuria.  He saw urology and was found to have a bladder tumor.  He had transurethral resection of the tumor in 2/16 with pathology showing a noninvasive low grade papillary urothelial malignancy.  He will be getting intravesical mitomycin C>  Due to bleeding issues, Dr Beryle Beams took him off warfarin.  He is still anemic and had transfusion of 2 units last week.  He has the same exertional dyspnea as in the past.  He is short of breath after walking about 50 feet.  No chest pain.  Weight is down 31 lbs since last appointment.  He has RLQ/flank severe pain that has been present for a couple of weeks and wants GI evaluation of this.  He brings home BP record, BP has been relatively low at home (SBP 100s).    Labs (10/14): LDL 54, HDL 59 Labs (12/14): K 3.8, creatinine 0.9, HCT 40.3 Labs (6/15): K 3.7, creatinine 0.9, hemoglobin 7.9 => 9.4, BNP 83 Labs (7/15): K 4.2, creatinine 0.92, HCT 32.9 Labs (11/15): K 4.3, creatinine 1.3 => 1.23, LDL 69, HDL 28, hgb 9.2 => 8.6 Labs (3/16):  K 4.1, creatinine 1.1, HCT 27.2  PMH: 1. Carotid stenosis: Carotid dopplers (12/13) with 0-39% stenosis. 2/15 carotids with minimal stenosis.  2. Hyperlipidemia 3. Gout 4. ITP: 5/10.  Thought to be due to Septra.  5. Obesity 6. OA 7. BPH 8. Nephrolithiasis 9. OSA 10. GERD 11. H/o melanoma 12. CAD: LHC (6/05) with EF 60%, 50% mLCx stenosis, 40% pLAD stenosis.  No intervention.  Lexiscan Cardiolite (6/15) with EF 74%, basal to mid inferior mild ischemia.   LHC (7/15) with 70% proximal LAD stenosis after D1, significant by FFR.  Patient had DES to proximal LAD.  13. DVT (11/14): Spontaneous.  APLAS serologies negative.  6/15 [place] for DVT showed no DVT but did show a superficial  thrombus right lesser saphenous vein. LE doppler (06/17/14): No evidence of lower extremity DVT or incompetence, bilaterally. Acute, partially occlusive superficial thrombus in the right lesser saphenous vein, similar to that described in the report from 05/30/14. Resolved left calf DVT. Chronic non-occlusive thrombus in the left lesser saphenous vein 14. Diastolic CHF: Echo (89/38) with EF 60-65%, aortic sclerosis without significant stenosis.  RHC (7/15) with mean RA 2, PA 22/8, mean PCWP 7, CI 2.9.  15. Symptomatic anemia 6/15, ?GI bleeding but nothing overt.  Lower GI bleed in 9/15, possible small bowel AVMs.  Capsule endoscopy was negative.  EGD 2/16 showed no bleeding site.  Capsule endoscopy 2/16 showed a site of bleeding in the terminal ileum.  16. Portal vein thrombosis: Diagnosed in 9/15.  No cirrhosis noted on abdominal CT.  17. COPD: See appt with Dr Joya Gaskins 7/15.  18. Hematuria: Patient was found to have a low grade papillary urothelial malignancy and is status post TUR in 2/16.  He will have mitomycin C intravesical chemo.    SH: Married, lives in [place], retired, 2 kids, quit smoking in 1968.   FH: h/o CVA and CAD  ROS: All systems reviewed and negative except as per HPI.   Current Outpatient Prescriptions  Medication Sig Dispense Refill  . allopurinol (ZYLOPRIM) 300 MG tablet Take 0.5 tablets (150 mg total) by mouth every morning.    Marland Kitchen aspirin EC 81 MG tablet Take 81 mg by mouth every evening.     . Cholecalciferol (VITAMIN D3) 2000 UNITS capsule Take 2,000 Units by mouth every morning.     . clopidogrel (PLAVIX) 75 MG tablet Take 75 mg by mouth every morning.     . finasteride (PROSCAR) 5 MG tablet Take 5 mg by mouth daily.    . furosemide (LASIX) 40 MG tablet Take 1 tablet (40 mg total) by mouth daily. 30 tablet 3  . iron polysaccharides (NIFEREX) 150 MG capsule Take 150 mg by mouth 2 (two) times daily.    Marland Kitchen LIVALO 4 MG TABS TAKE 1 TABLET AT BEDTIME 90 tablet 3  . Multiple  Vitamin (MULTIVITAMIN WITH MINERALS) TABS tablet Take 1 tablet by mouth daily.    . nitroGLYCERIN (NITROSTAT) 0.4 MG SL tablet Place 1 tablet (0.4 mg total) under the tongue every 5 (five) minutes as needed. For chest pain 25 tablet 5  . OVER THE COUNTER MEDICATION Place 2 drops into both eyes 4 (four) times daily. Wash type eye drop-    . oxyCODONE-acetaminophen (ROXICET) 5-325 MG per tablet Take 1 tablet by mouth every 4 (four) hours as needed for severe pain. 30 tablet 0  . pantoprazole (PROTONIX) 40 MG tablet Take 40 mg by mouth at bedtime.    . phenazopyridine (PYRIDIUM) 200 MG tablet Take  1 tablet (200 mg total) by mouth 3 (three) times daily as needed for pain. 30 tablet 3  . Probiotic Product (PROBIOTIC & ACIDOPHILUS EX ST PO) Take 1 tablet by mouth every morning.     . Psyllium (METAMUCIL PO) Take 20 mLs by mouth every evening.    . ranitidine (ZANTAC) 150 MG tablet Take 150 mg by mouth at bedtime.    Marland Kitchen spironolactone (ALDACTONE) 25 MG tablet Take 1 tablet (25 mg total) by mouth daily. 60 tablet 0   No current facility-administered medications for this encounter.    BP 134/62 mmHg  Pulse 67  Wt 230 lb 4 oz (104.441 kg)  SpO2 98% General: NAD, obese Neck: Thick, no JVD, no thyromegaly or thyroid nodule.  Lungs: Clear to auscultation bilaterally with normal respiratory effort. CV: Nondisplaced PMI.  Heart regular S1/S2, no S3/S4, no murmur. No ankle edema.  No carotid bruit.  Normal pedal pulses.  Abdomen: Soft, mild tenderness right flank, no hepatosplenomegaly, no distention.  Skin: Intact without lesions or rashes.  Neurologic: Alert and oriented x 3.  Psych: Normal affect. Extremities: No clubbing or cyanosis.   Assessment/Plan: 1. Dyspnea: This has been chronic. No PE on CTA chest 6/15.  He was thought to be volume overloaded at at prior appointment.  Lasix was increased but did not seem to help his symptoms much. He quit smoking years ago. LHC/RHC showed normal right and  left heart filling pressures and a 70-80% proximal LAD stenosis that was hemodynamically significant by FFR.  Patient had DES to proximal LAD.  This did not help his dyspnea. Seen by pulmonary, suspect some COPD based on chest CT with emphysema and obstruction on PFTs.  However, weight and deconditioning as well as anemia likely play a large role.   - I think he can decrease Lasix to 40 mg daily.  He will weigh daily and call if weight goes up.  Recent BMET looked ok.    - Continue weight loss. .   2. Hyperlipidemia: Good LDL in 11/15, continue Livalo.  3. DVT: Occurred in 11/14.  Possibly triggered by long hours sitting in a theater seat taking a test. Serologies for APLAS were negative.  CTA chest in 6/15 was negative for PE but he had a superficial thrombus on recent lower extremity dopplers.  Repeat dopplers also showed the presence of superficial thrombus (no DVT extension).  He is off coumadin now due to chronic blood loss.  4. Chronic diastolic CHF: NYHA class III symptoms, stable.  See above discussion regarding his diuretics.  Not volume overloaded on exam today, plan to decrease lasix to 40 mg daily.  5. CAD: s/p DES to LAD in 7/15.  This did not help exertional dyspnea.  I would like him to go back to cardiac rehab eventually. He is currently on ASA 81 and Plavix.  He has been on Plavix > 6 months s/p 2nd generation DES, so he could potentially stop it.  I will talk to Dr Beryle Beams about this.  6. Portal vein thrombosis: No definite trigger.  Liver not cirrhotic on imaging.  Coumadin stopped due to GI bleeding. 7. GI bleeding: Lower GI bleed in 9/15.  Possible small bowel AVMs => last capsule endoscopy showed a target in the terminal ileum that had bled.  He had transfusion last week.  8. Hematuria: Low grade papillary urothelial malignancy.  Plan for intravasical chemo. 9. HTN: BP running lower with weight loss.  I think that he can come off  Micardis at this point.  10. Right flank pain: He  is going to followup with GI about this.   Followup in 4 months.   Loralie Champagne 02/28/2015

## 2015-02-28 NOTE — Patient Instructions (Signed)
DISCONTINUE Micardis DECREASE Lasix to 40 mg daily  Your physician recommends that you schedule a follow-up appointment in: 4 months (JULY 2016)  Do the following things EVERYDAY: 1) Weigh yourself in the morning before breakfast. Write it down and keep it in a log. 2) Take your medicines as prescribed 3) Eat low salt foods-Limit salt (sodium) to 2000 mg per day.  4) Stay as active as you can everyday 5) Limit all fluids for the day to less than 2 liters 6)

## 2015-03-01 ENCOUNTER — Encounter (HOSPITAL_COMMUNITY): Payer: Self-pay | Admitting: Emergency Medicine

## 2015-03-01 ENCOUNTER — Emergency Department (HOSPITAL_COMMUNITY): Payer: Medicare Other

## 2015-03-01 ENCOUNTER — Encounter: Payer: Self-pay | Admitting: Nurse Practitioner

## 2015-03-01 ENCOUNTER — Ambulatory Visit (INDEPENDENT_AMBULATORY_CARE_PROVIDER_SITE_OTHER): Payer: Medicare Other | Admitting: Nurse Practitioner

## 2015-03-01 ENCOUNTER — Other Ambulatory Visit (HOSPITAL_COMMUNITY): Payer: Self-pay | Admitting: Cardiology

## 2015-03-01 ENCOUNTER — Telehealth: Payer: Self-pay | Admitting: Internal Medicine

## 2015-03-01 ENCOUNTER — Inpatient Hospital Stay (HOSPITAL_COMMUNITY): Payer: Medicare Other

## 2015-03-01 ENCOUNTER — Inpatient Hospital Stay (HOSPITAL_COMMUNITY)
Admission: EM | Admit: 2015-03-01 | Discharge: 2015-03-16 | DRG: 326 | Disposition: A | Discharge status: Home Health | Payer: Medicare Other | Attending: Internal Medicine | Admitting: Internal Medicine

## 2015-03-01 VITALS — BP 130/70 | HR 62 | Ht 71.6 in | Wt 229.0 lb

## 2015-03-01 DIAGNOSIS — M545 Low back pain: Secondary | ICD-10-CM | POA: Diagnosis present

## 2015-03-01 DIAGNOSIS — R109 Unspecified abdominal pain: Secondary | ICD-10-CM | POA: Diagnosis not present

## 2015-03-01 DIAGNOSIS — K922 Gastrointestinal hemorrhage, unspecified: Secondary | ICD-10-CM | POA: Diagnosis not present

## 2015-03-01 DIAGNOSIS — M109 Gout, unspecified: Secondary | ICD-10-CM | POA: Diagnosis present

## 2015-03-01 DIAGNOSIS — G893 Neoplasm related pain (acute) (chronic): Secondary | ICD-10-CM | POA: Diagnosis present

## 2015-03-01 DIAGNOSIS — Z955 Presence of coronary angioplasty implant and graft: Secondary | ICD-10-CM

## 2015-03-01 DIAGNOSIS — E039 Hypothyroidism, unspecified: Secondary | ICD-10-CM | POA: Diagnosis present

## 2015-03-01 DIAGNOSIS — Z86718 Personal history of other venous thrombosis and embolism: Secondary | ICD-10-CM | POA: Diagnosis not present

## 2015-03-01 DIAGNOSIS — C679 Malignant neoplasm of bladder, unspecified: Secondary | ICD-10-CM | POA: Diagnosis present

## 2015-03-01 DIAGNOSIS — K449 Diaphragmatic hernia without obstruction or gangrene: Secondary | ICD-10-CM | POA: Diagnosis present

## 2015-03-01 DIAGNOSIS — Z85828 Personal history of other malignant neoplasm of skin: Secondary | ICD-10-CM | POA: Diagnosis not present

## 2015-03-01 DIAGNOSIS — Z881 Allergy status to other antibiotic agents status: Secondary | ICD-10-CM | POA: Diagnosis not present

## 2015-03-01 DIAGNOSIS — Z96652 Presence of left artificial knee joint: Secondary | ICD-10-CM | POA: Diagnosis present

## 2015-03-01 DIAGNOSIS — Z800 Family history of malignant neoplasm of digestive organs: Secondary | ICD-10-CM | POA: Diagnosis not present

## 2015-03-01 DIAGNOSIS — K315 Obstruction of duodenum: Secondary | ICD-10-CM | POA: Diagnosis not present

## 2015-03-01 DIAGNOSIS — R066 Hiccough: Secondary | ICD-10-CM | POA: Diagnosis not present

## 2015-03-01 DIAGNOSIS — G629 Polyneuropathy, unspecified: Secondary | ICD-10-CM | POA: Diagnosis present

## 2015-03-01 DIAGNOSIS — E43 Unspecified severe protein-calorie malnutrition: Secondary | ICD-10-CM | POA: Diagnosis present

## 2015-03-01 DIAGNOSIS — Z7982 Long term (current) use of aspirin: Secondary | ICD-10-CM | POA: Diagnosis not present

## 2015-03-01 DIAGNOSIS — E780 Pure hypercholesterolemia: Secondary | ICD-10-CM | POA: Diagnosis present

## 2015-03-01 DIAGNOSIS — R160 Hepatomegaly, not elsewhere classified: Secondary | ICD-10-CM | POA: Diagnosis present

## 2015-03-01 DIAGNOSIS — I509 Heart failure, unspecified: Secondary | ICD-10-CM | POA: Diagnosis present

## 2015-03-01 DIAGNOSIS — I8500 Esophageal varices without bleeding: Secondary | ICD-10-CM | POA: Diagnosis present

## 2015-03-01 DIAGNOSIS — Z7902 Long term (current) use of antithrombotics/antiplatelets: Secondary | ICD-10-CM

## 2015-03-01 DIAGNOSIS — K3189 Other diseases of stomach and duodenum: Secondary | ICD-10-CM

## 2015-03-01 DIAGNOSIS — I2510 Atherosclerotic heart disease of native coronary artery without angina pectoris: Secondary | ICD-10-CM | POA: Diagnosis present

## 2015-03-01 DIAGNOSIS — Z79899 Other long term (current) drug therapy: Secondary | ICD-10-CM

## 2015-03-01 DIAGNOSIS — Z885 Allergy status to narcotic agent status: Secondary | ICD-10-CM | POA: Diagnosis not present

## 2015-03-01 DIAGNOSIS — Z8249 Family history of ischemic heart disease and other diseases of the circulatory system: Secondary | ICD-10-CM

## 2015-03-01 DIAGNOSIS — R591 Generalized enlarged lymph nodes: Secondary | ICD-10-CM | POA: Diagnosis not present

## 2015-03-01 DIAGNOSIS — R590 Localized enlarged lymph nodes: Secondary | ICD-10-CM | POA: Diagnosis present

## 2015-03-01 DIAGNOSIS — G4733 Obstructive sleep apnea (adult) (pediatric): Secondary | ICD-10-CM | POA: Diagnosis present

## 2015-03-01 DIAGNOSIS — Z87891 Personal history of nicotine dependence: Secondary | ICD-10-CM

## 2015-03-01 DIAGNOSIS — I5032 Chronic diastolic (congestive) heart failure: Secondary | ICD-10-CM | POA: Diagnosis present

## 2015-03-01 DIAGNOSIS — K766 Portal hypertension: Secondary | ICD-10-CM | POA: Diagnosis present

## 2015-03-01 DIAGNOSIS — Z6830 Body mass index (BMI) 30.0-30.9, adult: Secondary | ICD-10-CM

## 2015-03-01 DIAGNOSIS — Z888 Allergy status to other drugs, medicaments and biological substances status: Secondary | ICD-10-CM | POA: Diagnosis not present

## 2015-03-01 DIAGNOSIS — Z87442 Personal history of urinary calculi: Secondary | ICD-10-CM

## 2015-03-01 DIAGNOSIS — R110 Nausea: Secondary | ICD-10-CM | POA: Diagnosis not present

## 2015-03-01 DIAGNOSIS — Z86010 Personal history of colonic polyps: Secondary | ICD-10-CM | POA: Diagnosis not present

## 2015-03-01 DIAGNOSIS — N390 Urinary tract infection, site not specified: Secondary | ICD-10-CM | POA: Diagnosis present

## 2015-03-01 DIAGNOSIS — Z880 Allergy status to penicillin: Secondary | ICD-10-CM

## 2015-03-01 DIAGNOSIS — I472 Ventricular tachycardia: Secondary | ICD-10-CM | POA: Diagnosis present

## 2015-03-01 DIAGNOSIS — Z91048 Other nonmedicinal substance allergy status: Secondary | ICD-10-CM | POA: Diagnosis not present

## 2015-03-01 DIAGNOSIS — N401 Enlarged prostate with lower urinary tract symptoms: Secondary | ICD-10-CM | POA: Diagnosis present

## 2015-03-01 DIAGNOSIS — R350 Frequency of micturition: Secondary | ICD-10-CM | POA: Diagnosis present

## 2015-03-01 DIAGNOSIS — I10 Essential (primary) hypertension: Secondary | ICD-10-CM | POA: Diagnosis present

## 2015-03-01 DIAGNOSIS — R3915 Urgency of urination: Secondary | ICD-10-CM | POA: Diagnosis present

## 2015-03-01 DIAGNOSIS — C801 Malignant (primary) neoplasm, unspecified: Secondary | ICD-10-CM | POA: Diagnosis not present

## 2015-03-01 DIAGNOSIS — G8929 Other chronic pain: Secondary | ICD-10-CM | POA: Diagnosis present

## 2015-03-01 DIAGNOSIS — K219 Gastro-esophageal reflux disease without esophagitis: Secondary | ICD-10-CM | POA: Diagnosis present

## 2015-03-01 DIAGNOSIS — R1013 Epigastric pain: Secondary | ICD-10-CM | POA: Diagnosis not present

## 2015-03-01 DIAGNOSIS — K5900 Constipation, unspecified: Secondary | ICD-10-CM | POA: Diagnosis not present

## 2015-03-01 DIAGNOSIS — C170 Malignant neoplasm of duodenum: Secondary | ICD-10-CM | POA: Diagnosis present

## 2015-03-01 DIAGNOSIS — Z01810 Encounter for preprocedural cardiovascular examination: Secondary | ICD-10-CM | POA: Diagnosis not present

## 2015-03-01 DIAGNOSIS — D693 Immune thrombocytopenic purpura: Secondary | ICD-10-CM | POA: Diagnosis present

## 2015-03-01 DIAGNOSIS — R1909 Other intra-abdominal and pelvic swelling, mass and lump: Secondary | ICD-10-CM | POA: Diagnosis not present

## 2015-03-01 DIAGNOSIS — E559 Vitamin D deficiency, unspecified: Secondary | ICD-10-CM | POA: Diagnosis present

## 2015-03-01 DIAGNOSIS — I81 Portal vein thrombosis: Secondary | ICD-10-CM | POA: Diagnosis present

## 2015-03-01 DIAGNOSIS — R1011 Right upper quadrant pain: Secondary | ICD-10-CM | POA: Diagnosis not present

## 2015-03-01 DIAGNOSIS — I5031 Acute diastolic (congestive) heart failure: Secondary | ICD-10-CM

## 2015-03-01 DIAGNOSIS — Z882 Allergy status to sulfonamides status: Secondary | ICD-10-CM

## 2015-03-01 DIAGNOSIS — D500 Iron deficiency anemia secondary to blood loss (chronic): Secondary | ICD-10-CM | POA: Diagnosis present

## 2015-03-01 DIAGNOSIS — J449 Chronic obstructive pulmonary disease, unspecified: Secondary | ICD-10-CM | POA: Diagnosis present

## 2015-03-01 DIAGNOSIS — R1900 Intra-abdominal and pelvic swelling, mass and lump, unspecified site: Secondary | ICD-10-CM | POA: Insufficient documentation

## 2015-03-01 DIAGNOSIS — M170 Bilateral primary osteoarthritis of knee: Secondary | ICD-10-CM | POA: Diagnosis present

## 2015-03-01 DIAGNOSIS — C787 Secondary malignant neoplasm of liver and intrahepatic bile duct: Secondary | ICD-10-CM | POA: Diagnosis present

## 2015-03-01 LAB — LIPASE, BLOOD: LIPASE: 21 U/L (ref 11–59)

## 2015-03-01 LAB — COMPREHENSIVE METABOLIC PANEL
ALK PHOS: 140 U/L — AB (ref 39–117)
ALT: 37 U/L (ref 0–53)
AST: 25 U/L (ref 0–37)
Albumin: 3.7 g/dL (ref 3.5–5.2)
Anion gap: 9 (ref 5–15)
BUN: 24 mg/dL — ABNORMAL HIGH (ref 6–23)
CO2: 24 mmol/L (ref 19–32)
Calcium: 9.2 mg/dL (ref 8.4–10.5)
Chloride: 101 mmol/L (ref 96–112)
Creatinine, Ser: 1.26 mg/dL (ref 0.50–1.35)
GFR calc Af Amer: 59 mL/min — ABNORMAL LOW (ref >90)
GFR, EST NON AFRICAN AMERICAN: 51 mL/min — AB (ref >90)
Glucose, Bld: 105 mg/dL — ABNORMAL HIGH (ref 70–99)
POTASSIUM: 3.7 mmol/L (ref 3.5–5.1)
Sodium: 134 mmol/L — ABNORMAL LOW (ref 135–145)
TOTAL PROTEIN: 7.4 g/dL (ref 6.0–8.3)
Total Bilirubin: 0.8 mg/dL (ref 0.3–1.2)

## 2015-03-01 LAB — CBC WITH DIFFERENTIAL/PLATELET
BASOS PCT: 1 % (ref 0–1)
Basophils Absolute: 0.1 10*3/uL (ref 0.0–0.1)
EOS PCT: 2 % (ref 0–5)
Eosinophils Absolute: 0.2 10*3/uL (ref 0.0–0.7)
HEMATOCRIT: 34 % — AB (ref 39.0–52.0)
Hemoglobin: 10.5 g/dL — ABNORMAL LOW (ref 13.0–17.0)
Lymphocytes Relative: 12 % (ref 12–46)
Lymphs Abs: 1.2 10*3/uL (ref 0.7–4.0)
MCH: 27 pg (ref 26.0–34.0)
MCHC: 30.9 g/dL (ref 30.0–36.0)
MCV: 87.4 fL (ref 78.0–100.0)
Monocytes Absolute: 1.3 10*3/uL — ABNORMAL HIGH (ref 0.1–1.0)
Monocytes Relative: 13 % — ABNORMAL HIGH (ref 3–12)
Neutro Abs: 7.3 10*3/uL (ref 1.7–7.7)
Neutrophils Relative %: 72 % (ref 43–77)
Platelets: 382 10*3/uL (ref 150–400)
RBC: 3.89 MIL/uL — ABNORMAL LOW (ref 4.22–5.81)
RDW: 20.2 % — ABNORMAL HIGH (ref 11.5–15.5)
WBC: 10 10*3/uL (ref 4.0–10.5)

## 2015-03-01 MED ORDER — DOCUSATE SODIUM 100 MG PO CAPS
100.0000 mg | ORAL_CAPSULE | Freq: Two times a day (BID) | ORAL | Status: DC
  Administered 2015-03-01 – 2015-03-05 (×8): 100 mg via ORAL
  Filled 2015-03-01 (×9): qty 1

## 2015-03-01 MED ORDER — ACETAMINOPHEN 325 MG PO TABS: 650.0000 mg | ORAL_TABLET | Freq: Four times a day (QID) | ORAL | Status: DC | PRN

## 2015-03-01 MED ORDER — SPIRONOLACTONE 25 MG PO TABS
25.0000 mg | ORAL_TABLET | Freq: Every day | ORAL | Status: DC
Start: 2015-03-02 — End: 2015-03-05
  Administered 2015-03-02 – 2015-03-05 (×4): 25 mg via ORAL
  Filled 2015-03-01 (×4): qty 1

## 2015-03-01 MED ORDER — SODIUM CHLORIDE 0.9 % IV SOLN: 250.0000 mL | INTRAVENOUS | Status: DC | PRN

## 2015-03-01 MED ORDER — ALLOPURINOL 150 MG HALF TABLET
150.0000 mg | ORAL_TABLET | Freq: Every day | ORAL | Status: DC
  Administered 2015-03-02 – 2015-03-16 (×14): 150 mg via ORAL
  Filled 2015-03-01 (×15): qty 1

## 2015-03-01 MED ORDER — ENOXAPARIN SODIUM 40 MG/0.4ML ~~LOC~~ SOLN
40.0000 mg | Freq: Every day | SUBCUTANEOUS | Status: DC
  Administered 2015-03-01: 40 mg via SUBCUTANEOUS
  Filled 2015-03-01 (×2): qty 0.4

## 2015-03-01 MED ORDER — PANTOPRAZOLE SODIUM 40 MG PO TBEC
40.0000 mg | DELAYED_RELEASE_TABLET | Freq: Every day | ORAL | Status: DC
  Administered 2015-03-01 – 2015-03-04 (×4): 40 mg via ORAL
  Filled 2015-03-01 (×6): qty 1

## 2015-03-01 MED ORDER — ENSURE COMPLETE PO LIQD
237.0000 mL | Freq: Two times a day (BID) | ORAL | Status: DC
  Administered 2015-03-02 – 2015-03-15 (×15): 237 mL via ORAL

## 2015-03-01 MED ORDER — FENTANYL CITRATE 0.05 MG/ML IJ SOLN
50.0000 ug | Freq: Once | INTRAMUSCULAR | Status: AC
  Administered 2015-03-01: 50 ug via INTRAVENOUS
  Filled 2015-03-01: qty 2

## 2015-03-01 MED ORDER — POLYSACCHARIDE IRON COMPLEX 150 MG PO CAPS
150.0000 mg | ORAL_CAPSULE | Freq: Two times a day (BID) | ORAL | Status: DC
  Administered 2015-03-01 – 2015-03-16 (×28): 150 mg via ORAL
  Filled 2015-03-01 (×32): qty 1

## 2015-03-01 MED ORDER — SODIUM CHLORIDE 0.9 % IJ SOLN: 3.0000 mL | INTRAMUSCULAR | Status: DC | PRN

## 2015-03-01 MED ORDER — SODIUM CHLORIDE 0.9 % IV BOLUS (SEPSIS)
500.0000 mL | Freq: Once | INTRAVENOUS | Status: AC
  Administered 2015-03-01: 500 mL via INTRAVENOUS

## 2015-03-01 MED ORDER — IOHEXOL 300 MG/ML  SOLN
50.0000 mL | Freq: Once | INTRAMUSCULAR | Status: AC | PRN
  Administered 2015-03-01: 50 mL via ORAL

## 2015-03-01 MED ORDER — FAMOTIDINE 10 MG PO TABS
10.0000 mg | ORAL_TABLET | Freq: Every day | ORAL | Status: DC
  Administered 2015-03-02 – 2015-03-05 (×4): 10 mg via ORAL
  Filled 2015-03-01 (×4): qty 1

## 2015-03-01 MED ORDER — IOHEXOL 300 MG/ML  SOLN
100.0000 mL | Freq: Once | INTRAMUSCULAR | Status: AC | PRN
  Administered 2015-03-01: 100 mL via INTRAVENOUS

## 2015-03-01 MED ORDER — ONDANSETRON HCL 4 MG/2ML IJ SOLN
4.0000 mg | Freq: Once | INTRAMUSCULAR | Status: AC
  Administered 2015-03-01: 4 mg via INTRAVENOUS
  Filled 2015-03-01: qty 2

## 2015-03-01 MED ORDER — HYDROMORPHONE HCL 1 MG/ML IJ SOLN
0.5000 mg | Freq: Once | INTRAMUSCULAR | Status: AC
  Administered 2015-03-01: 0.5 mg via INTRAVENOUS
  Filled 2015-03-01: qty 1

## 2015-03-01 MED ORDER — FINASTERIDE 5 MG PO TABS
5.0000 mg | ORAL_TABLET | Freq: Every day | ORAL | Status: DC
  Administered 2015-03-02 – 2015-03-16 (×14): 5 mg via ORAL
  Filled 2015-03-01 (×15): qty 1

## 2015-03-01 MED ORDER — OXYCODONE HCL 5 MG PO TABS
5.0000 mg | ORAL_TABLET | ORAL | Status: DC | PRN
  Administered 2015-03-02 – 2015-03-05 (×2): 5 mg via ORAL
  Filled 2015-03-01: qty 2
  Filled 2015-03-01 (×2): qty 1
  Filled 2015-03-01 (×2): qty 2

## 2015-03-01 MED ORDER — ACETAMINOPHEN 650 MG RE SUPP: 650.0000 mg | Freq: Four times a day (QID) | RECTAL | Status: DC | PRN

## 2015-03-01 MED ORDER — HYDROMORPHONE HCL 1 MG/ML IJ SOLN
1.0000 mg | INTRAMUSCULAR | Status: DC | PRN
  Administered 2015-03-01 – 2015-03-15 (×24): 1 mg via INTRAVENOUS
  Filled 2015-03-01 (×24): qty 1

## 2015-03-01 MED ORDER — ONDANSETRON HCL 4 MG PO TABS: 4.0000 mg | ORAL_TABLET | Freq: Four times a day (QID) | ORAL | Status: DC | PRN

## 2015-03-01 MED ORDER — MORPHINE SULFATE 4 MG/ML IJ SOLN
4.0000 mg | Freq: Once | INTRAMUSCULAR | Status: AC
  Administered 2015-03-01: 4 mg via INTRAVENOUS
  Filled 2015-03-01: qty 1

## 2015-03-01 MED ORDER — PRAVASTATIN SODIUM 80 MG PO TABS
80.0000 mg | ORAL_TABLET | Freq: Every day | ORAL | Status: DC
  Administered 2015-03-02 – 2015-03-15 (×14): 80 mg via ORAL
  Filled 2015-03-01 (×15): qty 1

## 2015-03-01 MED ORDER — ONDANSETRON HCL 4 MG/2ML IJ SOLN
4.0000 mg | Freq: Four times a day (QID) | INTRAMUSCULAR | Status: DC | PRN
  Administered 2015-03-03: 4 mg via INTRAVENOUS
  Filled 2015-03-01: qty 2

## 2015-03-01 MED ORDER — FUROSEMIDE 40 MG PO TABS
40.0000 mg | ORAL_TABLET | Freq: Every day | ORAL | Status: DC
Start: 2015-03-02 — End: 2015-03-05
  Administered 2015-03-02 – 2015-03-05 (×4): 40 mg via ORAL
  Filled 2015-03-01 (×4): qty 1

## 2015-03-01 MED ORDER — SODIUM CHLORIDE 0.9 % IJ SOLN
3.0000 mL | Freq: Two times a day (BID) | INTRAMUSCULAR | Status: DC
  Administered 2015-03-01 – 2015-03-03 (×4): 3 mL via INTRAVENOUS

## 2015-03-01 NOTE — H&P (Signed)
PCP: Dr. Damita Dunnings Pulmonologist: Dr. Joya Gaskins Hematologist: Dr. Beryle Beams  Cardiology [facility]    Chief Complaint:  Abdominal pain  HPI: James [name] is a 81 y.o. male   has a past medical history of Bradycardia; carotid bruit, right; hypercholesterolemia; Obesity, morbid; Vasculitis; Immune thrombocytopenic purpura (5/20-5/27/2010; 2011); Dermatitis; Bleeding gums; Chronic low back pain; Myalgia and myositis; Unspecified vitamin D deficiency; Bladder diverticulum; Primary osteoarthritis of both knees; Nephrolithiasis; History of elevated glucose; Thrombophlebitis; History of BPH; History of colonic polyps; Epididymitis, left; Splenomegaly (05/15/09); Hypertension, benign; Neuropathy; Bruises easily; H/O hiatal hernia; GERD (gastroesophageal reflux disease); Urinary frequency; Urinary urgency; Nocturia; Urinary leakage; Abnormality of gait (03/19/2013); DVT of lower limb, acute (11/19/2013); Polio (1941); CHF (congestive heart failure); DVT (deep venous thrombosis) (10/2013); Pneumonia, bacterial; Pneumonia (1933); History of blood transfusion (05/2014); Anemia (05/2014); Iron deficiency anemia (05/2014); Arthritis; Gout; Malignant melanoma of ear; Thrombophlebitis of superficial veins of right lower extremity (06/23/2014); Shortness of breath; Esophageal varices without mention of bleeding (08/08/2014); and Portal vein thrombosis.   Presented with   Patient have had recent complicated medical course including in November thousand 14 he will diagnosed with left leg DVT he was on Xarelto for 6 months. After that he was admitted with dyspnea and was found to be severely anemic with hemoglobin down to 7.9. He was given blood transfusion and IV iron. Patient was followed by Dr. Beryle Beams. In August 2015 he had endoscopy showing portalgastropathy he had an MRI scan done that showed no significant parenchymal lesions but unusual appearance and aneurysmal dilatation of the portal vein branches, In September  2015 he was found to have portal vein thrombosis by duplex ultrasound and was started on Coumadin. He then developed blood per rectum and was admitted to [facility]. Extensive workup including capsule endoscopy showing esophageal varies  and evidence of early portal hypertension and colonoscopy  showed some small bowel AVMs and 2 small polyps as well as portal colopathy.    In November patient developed gross hematuria and was seen by urology and was found to have bladder tumor. He has undergone resection pathology showing a noninvasive low grade papillary urothelial malignancy.Treated by intravesical mitomycin.    Due to significant bleeding Dr Beryle Beams  took him off of Coumadin on 02/20/15.  EGD was done in February 2016 showed no bleeding site but capsule study in [place] 2016 showed a site of bleeding terminal ileum.  Itching has lost at least 30 pounds. Patient was having severe right lower quadrant pain that was present for the past few weeks for which she went to see GI. Patient reports pain to be excruciating worse with movement. He denies any nausea vomiting  Diarrhea, no fever. Given severity of his symptoms he was asked to present to emergency department.  CT scan was done Showing Bulky duodenal mass with significant adjacent right upper quadrant lymphadenopathy. There is also hepatic metastatic disease, retroperitoneal lymphadenopathy and portal vein thrombosis. Possible metastatic lesion involving the left ileal psoas muscle  She has history of nonobstructive CAD and chronic diastolic CHF 24/58 with EF 60-65% and aortic sclerosis without significant stenosis His last cardiac cath was in July 2015 Patient had DES to LAD.He is on aspirin and Plavix  Of note patient has COPD is followed by Dr. Joya Gaskins he has chronic dyspnea.  Hospitalist was called for admission for pain management and further work up  Review of Systems:    Pertinent positives include: abdominal pain,   Constitutional:    No weight loss, night  sweats, Fevers, chills, fatigue, weight loss  HEENT:  No headaches, Difficulty swallowing,Tooth/dental problems,Sore throat,  No sneezing, itching, ear ache, nasal congestion, post nasal drip,  Cardio-vascular:  No chest pain, Orthopnea, PND, anasarca, dizziness, palpitations.no Bilateral lower extremity swelling  GI:  No heartburn, indigestion, nausea, vomiting, diarrhea, change in bowel habits, loss of appetite, melena, blood in stool, hematemesis Resp:  no shortness of breath at rest. No dyspnea on exertion, No excess mucus, no productive cough, No non-productive cough, No coughing up of blood.No change in color of mucus.No wheezing. Skin:  no rash or lesions. No jaundice GU:  no dysuria, change in color of urine, no urgency or frequency. No straining to urinate.  No flank pain.  Musculoskeletal:  No joint pain or no joint swelling. No decreased range of motion. No back pain.  Psych:  No change in mood or affect. No depression or anxiety. No memory loss.  Neuro: no localizing neurological complaints, no tingling, no weakness, no double vision, no gait abnormality, no slurred speech, no confusion  Otherwise ROS are negative except for above, 10 systems were reviewed  Past Medical History: Past Medical History  Diagnosis Date  . Bradycardia     Hypertensive  . carotid bruit, right   . hypercholesterolemia     Trig 234/293;takes Simcor daily  . Obesity, morbid   . Vasculitis   . 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 Beryle Beams; Dr. Beryle Beams , normal platelets  . Dermatitis     legs  . Bleeding gums   . Chronic low back pain   . Myalgia and myositis   . Unspecified vitamin D deficiency   . Bladder diverticulum   . Primary osteoarthritis of both knees   . Nephrolithiasis   . History of elevated glucose   . Thrombophlebitis     right forearm  . History of BPH   . History of colonic polyps     [name]   . Epididymitis, left     S/P Epidimectomy  . Splenomegaly 05/15/09    abd U/S NML  . Hypertension, benign     takes Micardis daily  . Neuropathy     acute  . Bruises easily   . H/O hiatal hernia   . GERD (gastroesophageal reflux disease)     hx of but doesn't take any meds now  . Urinary frequency   . Urinary urgency   . Nocturia   . Urinary leakage   . Abnormality of gait 03/19/2013  . DVT of lower limb, acute 11/19/2013    Peroneal & distal tibial left 11/01/13  . Polio 1941    "mild case"  . CHF (congestive heart failure)     "low grade" (06/22/2014)  . DVT (deep venous thrombosis) 10/2013    LLE  . Pneumonia, bacterial     RML resolved, spirometry, normal  . Pneumonia 1933  . History of blood transfusion 05/2014  . Anemia 05/2014    "related to Xarelto"  . Iron deficiency anemia 05/2014    "got iron infusion"  . Arthritis     "all over"  . Gout   . Malignant melanoma of ear     right  . Thrombophlebitis of superficial veins of right lower extremity 06/23/2014    Incidental finding doppler 05/30/14  . Shortness of breath   . Esophageal varices without mention of bleeding 08/08/2014  . Portal vein thrombosis    Past Surgical History  Procedure Laterality Date  .  Cystoscopy w/ stone manipulation  1950's  . Fem cutaneous nerve entrapment  1977    due to surgery (mild lat anesth of bilat legs)  . Limited pelvis/hip bone scan  04/99    negative  . Bone scan  04/00  . Cataract extraction w/ intraocular lens  implant, bilateral Bilateral ~ 2004  . Melanoma excision Right 06/04    ear; Wide local excision,,with flap  . Stress cardiolite  05/11/04    mild inf. ischemia, EF 71%  . Shoulder surgery Left 06/17/2005    "tore it all to pieces when I fell; not fractured"  . Bronchoscopy  09/08    RML collapse with chronic pneumonia, bx neg (Dr. Joya Gaskins)  . Cyst excision Right 03/07/09    middle finger, DIP Joint Mucoid (Dr. Fredna Dow)  . Eyelid & eyebrow lift  1996  . Cyst  excision Right 08/15/09    middle finger, DIP Joint Mucoid Cyst (Dr. Fredna Dow)  . Appendectomy  1950  . Excision of mucoid tumor    . Colonoscopy    . Total knee arthroplasty  01/10/2012    Procedure: TOTAL KNEE ARTHROPLASTY;  Surgeon: Augustin Schooling, MD;  Location: [place];  Service: Orthopedics;  Laterality: Left;  . Surgery scrotal / testicular Left ~ 2000    removal of mass, noncancerous  . Cardiac catheterization  05/23/04    mild plague-statin  . Coronary angioplasty with stent placement  06/22/2014    to proximal LAD  . Inguinal hernia repair Bilateral 1959  . Inguinal hernia repair Left 1970's  . Esophagogastroduodenoscopy N/A 08/08/2014    Procedure: ESOPHAGOGASTRODUODENOSCOPY (EGD);  Surgeon: Gatha Mayer, MD;  Location[facility] ENDOSCOPY;  Service: Endoscopy;  Laterality: N/A;  . Colonoscopy N/A 08/08/2014    Procedure: COLONOSCOPY;  Surgeon: Gatha Mayer, MD;  Location: [place];  Service: Endoscopy;  Laterality: N/A;  . Flexible sigmoidoscopy N/A 09/21/2014    Procedure: FLEXIBLE SIGMOIDOSCOPY;  Surgeon: Jerene Bears, MD;  Location: [place];  Service: Gastroenterology;  Laterality: N/A;  . Givens capsule study N/A 09/21/2014    Procedure: GIVENS CAPSULE STUDY;  Surgeon: Jerene Bears, MD;  Location: [facility] ENDOSCOPY;  Service: Endoscopy;  Laterality: N/A;  . Left and right heart catheterization with coronary angiogram N/A 06/22/2014    Procedure: LEFT AND RIGHT HEART CATHETERIZATION WITH CORONARY ANGIOGRAM;  Surgeon: Larey Dresser, MD;  Location: [facility] CATH LAB;  Service: Cardiovascular;  Laterality: N/A;  . Percutaneous coronary stent intervention (pci-s)  06/22/2014    Procedure: PERCUTANEOUS CORONARY STENT INTERVENTION (PCI-S);  Surgeon: Larey Dresser, MD;  Location: [facility] CATH LAB;  Service: Cardiovascular;;  . Colonoscopy N/A 01/25/2015    Procedure: COLONOSCOPY;  Surgeon: Gatha Mayer, MD;  Location: [facility] ENDOSCOPY;  Service: Endoscopy;  Laterality: N/A;  . Cystoscopy with biopsy  Right 02/03/2015    Procedure: CYSTOSCOPY WITH COLD CUP BLADDER BIOPSY CAUTHERIZAITON OF RIGHT BLADDER CANCER AND SATILITE, TURBT RIGHT BLADDER BASE;  Surgeon: Ailene Rud, MD;  Location: [facility] ORS;  Service: Urology;  Laterality: Right;     Medications: Prior to Admission medications   Medication Sig Start Date End Date Taking? Authorizing Provider  allopurinol (ZYLOPRIM) 300 MG tablet Take 0.5 tablets (150 mg total) by mouth every morning. 10/31/14  Yes Tonia Ghent, MD  aspirin EC 81 MG tablet Take 81 mg by mouth every evening.    Yes Historical Provider, MD  Cholecalciferol (VITAMIN D3) 2000 UNITS capsule Take 2,000 Units by mouth every morning.  Yes Historical Provider, MD  clopidogrel (PLAVIX) 75 MG tablet Take 75 mg by mouth every morning.    Yes Historical Provider, MD  finasteride (PROSCAR) 5 MG tablet Take 5 mg by mouth daily.   Yes Historical Provider, MD  furosemide (LASIX) 40 MG tablet Take 1 tablet (40 mg total) by mouth daily. 02/28/15  Yes Larey Dresser, MD  iron polysaccharides (NIFEREX) 150 MG capsule Take 150 mg by mouth 2 (two) times daily.   Yes Historical Provider, MD  LIVALO 4 MG TABS TAKE 1 TABLET AT BEDTIME 10/03/14  Yes Jolaine Artist, MD  Multiple Vitamin (MULTIVITAMIN WITH MINERALS) TABS tablet Take 1 tablet by mouth daily.   Yes Historical Provider, MD  OVER THE COUNTER MEDICATION Place 2 drops into both eyes 4 (four) times daily. Wash type eye drop-   Yes Historical Provider, MD  oxyCODONE-acetaminophen (ROXICET) 5-325 MG per tablet Take 1 tablet by mouth every 4 (four) hours as needed for severe pain. 02/03/15  Yes Carolan Clines, MD  pantoprazole (PROTONIX) 40 MG tablet Take 40 mg by mouth at bedtime.   Yes Historical Provider, MD  phenazopyridine (PYRIDIUM) 200 MG tablet Take 1 tablet (200 mg total) by mouth 3 (three) times daily as needed for pain. 02/03/15  Yes Carolan Clines, MD  Probiotic Product (PROBIOTIC & ACIDOPHILUS EX ST PO) Take 1  tablet by mouth every morning.    Yes Historical Provider, MD  Psyllium (METAMUCIL PO) Take 20 mLs by mouth every evening.   Yes Historical Provider, MD  ranitidine (ZANTAC) 150 MG tablet Take 150 mg by mouth at bedtime.   Yes Historical Provider, MD  spironolactone (ALDACTONE) 25 MG tablet Take 1 tablet (25 mg total) by mouth daily. 10/26/14  Yes Larey Dresser, MD  nitroGLYCERIN (NITROSTAT) 0.4 MG SL tablet Place 1 tablet (0.4 mg total) under the tongue every 5 (five) minutes as needed. For chest pain 07/07/14   Larey Dresser, MD    Allergies:   Allergies  Allergen Reactions  . Sulfa Antibiotics Other (See Comments)    Caused patient have ITP  . Aleve [Naproxen Sodium] Other (See Comments)    Causes platlet drop  . Rofecoxib Other (See Comments)    Celebrex  - affected low platelets  . Sulfamethoxazole-Trimethoprim Other (See Comments)     Causes low platelets and bleeding  . Tape Other (See Comments)    Adhesive tape makes skin tear.  . Tramadol Hcl Other (See Comments)    dizziness, drowsiness  . Neomycin-Bacitracin Zn-Polymyx Rash  . Neosporin [Neomycin-Polymyxin-Gramicidin] Rash  . Penicillins Swelling and Rash    Took dose of amoxicillin 2000mg  02/02/15 at dentist office without any complications    Social History:  Ambulatory occasionally needs  Kasandra Knudsen, Lives at home  With family     reports that he quit smoking about 47 years ago. His smoking use included Cigarettes and Cigars. He has a 40 pack-year smoking history. He has never used smokeless tobacco. He reports that he does not drink alcohol or use illicit drugs.    Family History: family history includes Colon cancer in his brother; Diabetes in his father; Heart attack in his father; Heart disease in his father; Stroke in his brother; Thyroid cancer in his mother. There is no history of Anesthesia problems, Hypotension, Malignant hyperthermia, Pseudochol deficiency, or Prostate cancer.    Physical Exam: Patient  Vitals for the past 24 hrs:  BP Temp Temp src Pulse Resp SpO2  03/01/15 2030 125/57 mmHg - -  62 - 92 %  03/01/15 2000 133/61 mmHg - - 62 - 97 %  03/01/15 1916 (!) 135/53 mmHg - - 63 18 98 %  03/01/15 1618 168/67 mmHg 97.8 F (36.6 C) Oral 72 16 100 %  03/01/15 1425 136/67 mmHg 97.5 F (36.4 C) Oral 68 19 100 %    1. General:  in No Acute distress 2. Psychological: Alert and  Oriented 3. Head/ENT:     Dry Mucous Membranes                          Head Non traumatic, neck supple                          Normal   Dentition 4. SKIN: normal   Skin turgor,  Skin clean Dry and intact no rash 5. Heart: Regular rate and rhythm no Murmur, Rub or gallop 6. Lungs: Clear to auscultation bilaterally, no wheezes or crackles   7. Abdomen: Soft, non-tender, Non distended 8. Lower extremities: no clubbing, cyanosis, or edema 9. Neurologically Grossly intact, moving all 4 extremities equally 10. MSK: Normal range of motion  body mass index is unknown because there is no weight on file.   Labs on Admission:   Results for orders placed or performed during the hospital encounter of 03/01/15 (from the past 24 hour(s))  CBC with Differential     Status: Abnormal   Collection Time: 03/01/15  3:00 PM  Result Value Ref Range   WBC 10.0 4.0 - 10.5 K/uL   RBC 3.89 (L) 4.22 - 5.81 MIL/uL   Hemoglobin 10.5 (L) 13.0 - 17.0 g/dL   HCT 34.0 (L) 39.0 - 52.0 %   MCV 87.4 78.0 - 100.0 fL   MCH 27.0 26.0 - 34.0 pg   MCHC 30.9 30.0 - 36.0 g/dL   RDW 20.2 (H) 11.5 - 15.5 %   Platelets 382 150 - 400 K/uL   Neutrophils Relative % 72 43 - 77 %   Neutro Abs 7.3 1.7 - 7.7 K/uL   Lymphocytes Relative 12 12 - 46 %   Lymphs Abs 1.2 0.7 - 4.0 K/uL   Monocytes Relative 13 (H) 3 - 12 %   Monocytes Absolute 1.3 (H) 0.1 - 1.0 K/uL   Eosinophils Relative 2 0 - 5 %   Eosinophils Absolute 0.2 0.0 - 0.7 K/uL   Basophils Relative 1 0 - 1 %   Basophils Absolute 0.1 0.0 - 0.1 K/uL  Comprehensive metabolic panel     Status:  Abnormal   Collection Time: 03/01/15  3:00 PM  Result Value Ref Range   Sodium 134 (L) 135 - 145 mmol/L   Potassium 3.7 3.5 - 5.1 mmol/L   Chloride 101 96 - 112 mmol/L   CO2 24 19 - 32 mmol/L   Glucose, Bld 105 (H) 70 - 99 mg/dL   BUN 24 (H) 6 - 23 mg/dL   Creatinine, Ser 1.26 0.50 - 1.35 mg/dL   Calcium 9.2 8.4 - 10.5 mg/dL   Total Protein 7.4 6.0 - 8.3 g/dL   Albumin 3.7 3.5 - 5.2 g/dL   AST 25 0 - 37 U/L   ALT 37 0 - 53 U/L   Alkaline Phosphatase 140 (H) 39 - 117 U/L   Total Bilirubin 0.8 0.3 - 1.2 mg/dL   GFR calc non Af Amer 51 (L) >90 mL/min   GFR calc Af Amer 59 (L) >90  mL/min   Anion gap 9 5 - 15  Lipase, blood     Status: None   Collection Time: 03/01/15  3:00 PM  Result Value Ref Range   Lipase 21 11 - 59 U/L    UA not obtained  Lab Results  Component Value Date   HGBA1C 6.4 05/05/2014    Estimated Creatinine Clearance: 54.1 mL/min (by C-G formula based on Cr of 1.26).  BNP (last 3 results)  Recent Labs  05/29/14 1910 06/06/14 1244 11/29/14 0944  PROBNP 36.3 83.0 114.6    Other results:  I have pearsonaly reviewed this: ECG NOT OBTAINED There were no vitals filed for this visit.   Cultures:    Component Value Date/Time   SDES URINE, CLEAN CATCH 01/29/2015 1721   SPECREQUEST NONE 01/29/2015 1721   CULT NO GROWTH Performed at [facility]  01/29/2015 1721   REPTSTATUS 01/31/2015 FINAL 01/29/2015 1721     Radiological Exams on Admission: Ct Abdomen Pelvis W Contrast  03/01/2015   CLINICAL DATA:  Right flank pain which began 10 days ago. Progressively worsening. Bladder cancer diagnosed 02/03/2015  EXAM: CT ABDOMEN AND PELVIS WITH CONTRAST  TECHNIQUE: Multidetector CT imaging of the abdomen and pelvis was performed using the standard protocol following bolus administration of intravenous contrast.  CONTRAST:  142mL OMNIPAQUE IOHEXOL 300 MG/ML SOLN, 30mL OMNIPAQUE IOHEXOL 300 MG/ML SOLN  COMPARISON:  09/24/2014  FINDINGS: Lower chest: The  lung bases are clear of acute process. Minimal dependent atelectasis and basilar scarring changes. The heart is normal in size coronary artery calcifications are noted. Moderate aortic calcifications. There is a small hiatal hernia.  Hepatobiliary: The E liver demonstrates a bizarre enhancement pattern due to portal vein thrombosis also demonstrated on the prior study. The right hepatic lobe liver lesions seen on the prior study have coalesced into a large mass occupying the inferior aspect of the right hepatic lobe.  Pancreas: Stable fatty changes.  No mass or inflammation.  Spleen: Normal size.  No focal lesions.  Adrenals/Urinary Tract: Stable right adrenal gland angioma mild lipoma. The kidneys are unremarkable.  Stomach/Bowel: The stomach is unremarkable. There is a large duodenum mass likely accounting for the patient's hepatic metastatic disease. There is also now bulky portal, peripancreatic and celiac axis lymphadenopathy. A necrotic appearing node measures 4.1 x 2.7 cm on image number 24. The duodenum mass measures approximately 4.3 x 4.3 cm. The small bowel and colon are unremarkable. No inflammation or obstruction.  Vascular/Lymphatic: There is left-sided retroperitoneal lymphadenopathy in addition to the upper abdominal adenopathy surrounding the duodenum. The aorta demonstrates stable atherosclerotic calcifications. No focal aneurysm.  Pelvis: Without obvious discrete bladder mass. Prostate gland is enlarged. The seminal vesicles are unremarkable. No pelvic lymphadenopathy. No inguinal lymphadenopathy. A left inguinal hernia is noted containing fat. There is a lesion in the left ileus psoas muscle suspicious for a muscle metastasis. A liquified hematoma is also possible.  Musculoskeletal: No obvious destructive metastatic bone lesions. There is a stable sclerotic lesion involving the right acetabular. Vague lucencies are noted in the left iliac bone anteriorly.  IMPRESSION: 1. Bulky duodenal mass with  significant adjacent right upper quadrant lymphadenopathy. There is also hepatic metastatic disease, retroperitoneal lymphadenopathy and portal vein thrombosis. 2. Possible metastatic lesion involving the left ileal psoas muscle. A liquified hematoma is also possible. 3. Stable small lucencies in the left iliac bone. No obvious osseous metastatic disease.   Electronically Signed   By: Marijo Sanes M.D.   On: 03/01/2015  20:12    Chart has been reviewed  Assessment/Plan 81 yo male with history of coronary artery disease, diastolic heart failure, chronic anemia due to chronic blood loss, portal vein thrombosis, presents with excruciating right low quadrant pain and was found to have new duodenal mass as well as large hepatic masses. Patient is on Plavix for history of drug eluded stent placed in July  Present on Admission:  . 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 Patient is on Plavix will need to discontinue for next few days so that biopsy can be obtained. Patient was told by cardiologist that it was all right to stop his Plavix if needed.  Marland Kitchen HYPERTENSION, BENIGN - continue home medications  . CAD, NATIVE VESSEL - stable continue home medications but holding aspirin and Plavix as he will likely need biopsy in the near future  . Chronic diastolic CHF (congestive heart failure) currently appears to be euvolemic continue Lasix at 40  . Anemia due to chronic blood loss stable currently does not require transfusion. We'll continue to monitor  . Abdominal pain likely secondary to hepatic mass. Will start on by mouth oxycodone and IV Dilaudid as needed once able to see how much this he required we'll need to start him along glass to medications  . Hepatic metastases - discussed with GI and radiology. Per GI liver would be a better biopsy site. We'll need to consult interventional radiology tomorrow. Patient has to be off of Plavix for 5 days  . Portal vein thrombosis -this  is chronic patient did not tolerate in the past anticoagulation secondary to repeated bleeding   Prophylaxis:Lovenox, Protonix  CODE STATUS:  FULL CODE   Other plan as per orders.  I have spent a total of 61min on this admission  James [name] 03/01/2015, 9:26 PM  [facility]  Pager [phone]   after 2 AM please page floor coverage PA If 7AM-7PM, please contact the day team taking care of the patient  [email or web address]  Password [email or web address]

## 2015-03-01 NOTE — ED Notes (Signed)
Pt was sent over here from [place]. Pt c/o  right side abd pain x 18 days and just getting worse.  Pt denies n/v/d or urinary problems.

## 2015-03-01 NOTE — Telephone Encounter (Signed)
Patient reports right flank pain. He states pain on his right flank area "right at the level of my belt".  Pain started 8-10 days ago and has gotten progressively worse.  He reports that the pain is "excruciating" with movement, but "if I'm still I'm fine".  He denies any other symptoms.  He denies nausea, vomiting, fever, diarrhea, constipation or other complaints.  He will come in today at 1:30.  He is asked to be NPO after 10:30 incase he needs imaging this pm.  He will see Tye Savoy RNP

## 2015-03-01 NOTE — Progress Notes (Signed)
     History of Present Illness:  Patient is an 81 year old male with multiple medical problems known to Dr. Carlean Purl. Some of his medical problems include coronary artery disease with stent placement,  CHF, obesity, DVT, and portal vein thrombosis resulting in portal HTN .  We evaluated him last August for anemia. Surprisingly esophageal varices and possible rectal varix found on endoscopy. Subsequent video capsule study late September suggested abnormalities of distal ileum in form of edema but also debris. Repeat capsule study in January showed blood in distal ileum. Repeat colonoscopy with terminal ileal intubation was scheduled early February. Patient began bleeding during the prep. He was hospitalized and inpatient colonoscopy showed some areas of trauma and also probable rectal varix. Hematology who follows him for PVT and multifactorial anemia made the decision to discontinue coumadin. No further bleeding  Patient worked in today for ten day history of progressive right mid abdominal pain. Pain unrelated to eating.Movement increases the pain. No vomiting. Bowels moving okay. Patient has never had this pain before.    Current Medications, Allergies, Past Medical History, Past Surgical History, Family History and Social History were reviewed in [employer] record.   Physical Exam: General: Pleasant, obese white male having intermittent episodes of severe abdominal pain during exam.  Head: Normocephalic and atraumatic Eyes:  sclerae anicteric, conjunctiva pink  Ears: Normal auditory acuity Lungs: Clear throughout to auscultation Heart: Regular rate and rhythm Abdomen: Soft,  Obese, no peritoneal signs but markedly tenderness of right mid abdomen.. Active bowel sounds Musculoskeletal: Symmetrical with no gross deformities  Extremities: No edema  Neurological: Alert oriented x 4, grossly nonfocal Psychological:  Alert and cooperative. Normal mood and  affect  Assessment and Recommendations:   81 year old male with complex medical history presenting with progressive right sided abdominal pain. He is remarkably tender on exam and tearful when pain strikes. Patient has known PVT, now off coumadin. Not sure if he has had an ischemic event or what is going on. Patient needs immediate workup with labs / CTscan. Patient's daughter is driving him over to [facility] ED now. Our inpatient team will be contacted

## 2015-03-01 NOTE — ED Notes (Signed)
Attempted to give report to floor nurse, per secretary, receiving nurse is in a pt's room and requested to return call.

## 2015-03-01 NOTE — Patient Instructions (Signed)
Please go to [facility] Emergency room now for evaluation.

## 2015-03-01 NOTE — ED Notes (Signed)
Pt stated he has not had anything to eat or drink since this morning and is unable to urinate at this time.

## 2015-03-01 NOTE — ED Provider Notes (Signed)
CSN: [id number]     Arrival date & time 03/01/15  1418 History   First MD Initiated Contact with Patient 03/01/15 1811     Chief Complaint  Patient presents with  . Abdominal Pain     (Consider location/radiation/quality/duration/timing/severity/associated sxs/prior Treatment) HPI Comments: Patient presents with abdominal pain. He states over last 8-10 days she's had some pain in his right midabdomen. He states it's been getting worse over the last 2 days. He's had some decreased appetite and some nausea but no vomiting. He denies any fevers or chills. He states the pain is nonradiating. He denies any urinary symptoms. He has already  had his appendix out many years ago. [facility] and sent over here for further evaluation. He does have a history of DVT and portal vein thrombosis and was on Coumadin until last week when he was taken off the Coumadin. He's had problems with ongoing anemia and it was felt that the Coumadin might be contributing to his chronic anemia.  Patient is a 81 y.o. male presenting with abdominal pain.  Abdominal Pain Associated symptoms: nausea   Associated symptoms: no chest pain, no chills, no cough, no diarrhea, no fatigue, no fever, no hematuria, no shortness of breath and no vomiting     Past Medical History  Diagnosis Date  . Bradycardia     Hypertensive  . carotid bruit, right   . hypercholesterolemia     Trig 234/293;takes Simcor daily  . Obesity, morbid   . Vasculitis   . 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 Beryle Beams; Dr. Beryle Beams , normal platelets  . Dermatitis     legs  . Bleeding gums   . Chronic low back pain   . Myalgia and myositis   . Unspecified vitamin D deficiency   . Bladder diverticulum   . Primary osteoarthritis of both knees   . Nephrolithiasis   . History of elevated glucose   . Thrombophlebitis     right forearm  . History of BPH   . History of colonic  polyps     [name]  . Epididymitis, left     S/P Epidimectomy  . Splenomegaly 05/15/09    abd U/S NML  . Hypertension, benign     takes Micardis daily  . Neuropathy     acute  . Bruises easily   . H/O hiatal hernia   . GERD (gastroesophageal reflux disease)     hx of but doesn't take any meds now  . Urinary frequency   . Urinary urgency   . Nocturia   . Urinary leakage   . Abnormality of gait 03/19/2013  . DVT of lower limb, acute 11/19/2013    Peroneal & distal tibial left 11/01/13  . Polio 1941    "mild case"  . CHF (congestive heart failure)     "low grade" (06/22/2014)  . DVT (deep venous thrombosis) 10/2013    LLE  . Pneumonia, bacterial     RML resolved, spirometry, normal  . Pneumonia 1933  . History of blood transfusion 05/2014  . Anemia 05/2014    "related to Xarelto"  . Iron deficiency anemia 05/2014    "got iron infusion"  . Arthritis     "all over"  . Gout   . Malignant melanoma of ear     right  . Thrombophlebitis of superficial veins of right lower extremity 06/23/2014    Incidental finding doppler 05/30/14  .  Shortness of breath   . Esophageal varices without mention of bleeding 08/08/2014  . Portal vein thrombosis    Past Surgical History  Procedure Laterality Date  . Cystoscopy w/ stone manipulation  1950's  . Fem cutaneous nerve entrapment  1977    due to surgery (mild lat anesth of bilat legs)  . Limited pelvis/hip bone scan  04/99    negative  . Bone scan  04/00  . Cataract extraction w/ intraocular lens  implant, bilateral Bilateral ~ 2004  . Melanoma excision Right 06/04    ear; Wide local excision,,with flap  . Stress cardiolite  05/11/04    mild inf. ischemia, EF 71%  . Shoulder surgery Left 06/17/2005    "tore it all to pieces when I fell; not fractured"  . Bronchoscopy  09/08    RML collapse with chronic pneumonia, bx neg (Dr. Joya Gaskins)  . Cyst excision Right 03/07/09    middle finger, DIP Joint Mucoid (Dr. Fredna Dow)  . Eyelid & eyebrow  lift  1996  . Cyst excision Right 08/15/09    middle finger, DIP Joint Mucoid Cyst (Dr. Fredna Dow)  . Appendectomy  1950  . Excision of mucoid tumor    . Colonoscopy    . Total knee arthroplasty  01/10/2012    Procedure: TOTAL KNEE ARTHROPLASTY;  Surgeon: Augustin Schooling, MD;  Location: [place];  Service: Orthopedics;  Laterality: Left;  . Surgery scrotal / testicular Left ~ 2000    removal of mass, noncancerous  . Cardiac catheterization  05/23/04    mild plague-statin  . Coronary angioplasty with stent placement  06/22/2014    to proximal LAD  . Inguinal hernia repair Bilateral 1959  . Inguinal hernia repair Left 1970's  . Esophagogastroduodenoscopy N/A 08/08/2014    Procedure: ESOPHAGOGASTRODUODENOSCOPY (EGD);  Surgeon: Gatha Mayer, MD;  Location: [facility] ENDOSCOPY;  Service: Endoscopy;  Laterality: N/A;  . Colonoscopy N/A 08/08/2014    Procedure: COLONOSCOPY;  Surgeon: Gatha Mayer, MD;  Location: [place];  Service: Endoscopy;  Laterality: N/A;  . Flexible sigmoidoscopy N/A 09/21/2014    Procedure: FLEXIBLE SIGMOIDOSCOPY;  Surgeon: Jerene Bears, MD;  Location: [place];  Service: Gastroenterology;  Laterality: N/A;  . Givens capsule study N/A 09/21/2014    Procedure: GIVENS CAPSULE STUDY;  Surgeon: Jerene Bears, MD;  Location: [facility] ENDOSCOPY;  Service: Endoscopy;  Laterality: N/A;  . Left and right heart catheterization with coronary angiogram N/A 06/22/2014    Procedure: LEFT AND RIGHT HEART CATHETERIZATION WITH CORONARY ANGIOGRAM;  Surgeon: Larey Dresser, MD;  Location: [facility] CATH LAB;  Service: Cardiovascular;  Laterality: N/A;  . Percutaneous coronary stent intervention (pci-s)  06/22/2014    Procedure: PERCUTANEOUS CORONARY STENT INTERVENTION (PCI-S);  Surgeon: Larey Dresser, MD;  Location: [facility] CATH LAB;  Service: Cardiovascular;;  . Colonoscopy N/A 01/25/2015    Procedure: COLONOSCOPY;  Surgeon: Gatha Mayer, MD;  Location: [facility] ENDOSCOPY;  Service: Endoscopy;  Laterality: N/A;  .  Cystoscopy with biopsy Right 02/03/2015    Procedure: CYSTOSCOPY WITH COLD CUP BLADDER BIOPSY CAUTHERIZAITON OF RIGHT BLADDER CANCER AND SATILITE, TURBT RIGHT BLADDER BASE;  Surgeon: Ailene Rud, MD;  Location: [facility] ORS;  Service: Urology;  Laterality: Right;   Family History  Problem Relation Age of Onset  . Thyroid cancer Mother   . Diabetes Father   . Heart disease Father   . Stroke Brother     cerebral hemm  . Colon cancer Brother   . Anesthesia problems Neg  Hx   . Hypotension Neg Hx   . Malignant hyperthermia Neg Hx   . Pseudochol deficiency Neg Hx   . Prostate cancer Neg Hx   . Heart attack Father    History  Substance Use Topics  . Smoking status: Former Smoker -- 2.00 packs/day for 20 years    Types: Cigarettes, Cigars    Quit date: 09/27/1967  . Smokeless tobacco: Never Used  . Alcohol Use: No    Review of Systems  Constitutional: Positive for appetite change. Negative for fever, chills, diaphoresis and fatigue.  HENT: Negative for congestion, rhinorrhea and sneezing.   Eyes: Negative.   Respiratory: Negative for cough, chest tightness and shortness of breath.   Cardiovascular: Negative for chest pain and leg swelling.  Gastrointestinal: Positive for nausea and abdominal pain. Negative for vomiting, diarrhea and blood in stool.  Genitourinary: Negative for frequency, hematuria, flank pain and difficulty urinating.  Musculoskeletal: Negative for back pain and arthralgias.  Skin: Negative for rash.  Neurological: Negative for dizziness, speech difficulty, weakness, numbness and headaches.      Allergies  Sulfa antibiotics; Aleve; Rofecoxib; Sulfamethoxazole-trimethoprim; Tape; Tramadol hcl; Neomycin-bacitracin zn-polymyx; Neosporin; and Penicillins  Home Medications   Prior to Admission medications   Medication Sig Start Date End Date Taking? Authorizing Provider  allopurinol (ZYLOPRIM) 300 MG tablet Take 0.5 tablets (150 mg total) by mouth every morning.  10/31/14  Yes Tonia Ghent, MD  aspirin EC 81 MG tablet Take 81 mg by mouth every evening.    Yes Historical Provider, MD  Cholecalciferol (VITAMIN D3) 2000 UNITS capsule Take 2,000 Units by mouth every morning.    Yes Historical Provider, MD  clopidogrel (PLAVIX) 75 MG tablet Take 75 mg by mouth every morning.    Yes Historical Provider, MD  finasteride (PROSCAR) 5 MG tablet Take 5 mg by mouth daily.   Yes Historical Provider, MD  furosemide (LASIX) 40 MG tablet Take 1 tablet (40 mg total) by mouth daily. 02/28/15  Yes Larey Dresser, MD  iron polysaccharides (NIFEREX) 150 MG capsule Take 150 mg by mouth 2 (two) times daily.   Yes Historical Provider, MD  LIVALO 4 MG TABS TAKE 1 TABLET AT BEDTIME 10/03/14  Yes Jolaine Artist, MD  Multiple Vitamin (MULTIVITAMIN WITH MINERALS) TABS tablet Take 1 tablet by mouth daily.   Yes Historical Provider, MD  OVER THE COUNTER MEDICATION Place 2 drops into both eyes 4 (four) times daily. Wash type eye drop-   Yes Historical Provider, MD  oxyCODONE-acetaminophen (ROXICET) 5-325 MG per tablet Take 1 tablet by mouth every 4 (four) hours as needed for severe pain. 02/03/15  Yes Carolan Clines, MD  pantoprazole (PROTONIX) 40 MG tablet Take 40 mg by mouth at bedtime.   Yes Historical Provider, MD  phenazopyridine (PYRIDIUM) 200 MG tablet Take 1 tablet (200 mg total) by mouth 3 (three) times daily as needed for pain. 02/03/15  Yes Carolan Clines, MD  Probiotic Product (PROBIOTIC & ACIDOPHILUS EX ST PO) Take 1 tablet by mouth every morning.    Yes Historical Provider, MD  Psyllium (METAMUCIL PO) Take 20 mLs by mouth every evening.   Yes Historical Provider, MD  ranitidine (ZANTAC) 150 MG tablet Take 150 mg by mouth at bedtime.   Yes Historical Provider, MD  spironolactone (ALDACTONE) 25 MG tablet Take 1 tablet (25 mg total) by mouth daily. 10/26/14  Yes Larey Dresser, MD  nitroGLYCERIN (NITROSTAT) 0.4 MG SL tablet Place 1 tablet (0.4 mg total) under the  tongue every 5 (five) minutes as needed. For chest pain 07/07/14   Larey Dresser, MD   BP 131/50 mmHg  Pulse 63  Temp(Src) 97.6 F (36.4 C) (Oral)  Resp 20  Ht 5' 11.5" (1.816 m)  Wt 227 lb 11.8 oz (103.3 kg)  BMI 31.32 kg/m2  SpO2 95% Physical Exam  Constitutional: He is oriented to person, place, and time. He appears well-developed and well-nourished.  HENT:  Head: Normocephalic and atraumatic.  Eyes: Pupils are equal, round, and reactive to light.  Neck: Normal range of motion. Neck supple.  Cardiovascular: Normal rate, regular rhythm and normal heart sounds.   Pulmonary/Chest: Effort normal and breath sounds normal. No respiratory distress. He has no wheezes. He has no rales. He exhibits no tenderness.  Abdominal: Soft. Bowel sounds are normal. There is tenderness (marked tenderness to the right midabdomen with voluntary guarding). There is no rebound and no guarding.  Musculoskeletal: Normal range of motion. He exhibits no edema.  Lymphadenopathy:    He has no cervical adenopathy.  Neurological: He is alert and oriented to person, place, and time.  Skin: Skin is warm and dry. No rash noted.  Psychiatric: He has a normal mood and affect.    ED Course  Procedures (including critical care time) Labs Review Labs Reviewed  CBC WITH DIFFERENTIAL/PLATELET - Abnormal; Notable for the following:    RBC 3.89 (*)    Hemoglobin 10.5 (*)    HCT 34.0 (*)    RDW 20.2 (*)    Monocytes Relative 13 (*)    Monocytes Absolute 1.3 (*)    All other components within normal limits  COMPREHENSIVE METABOLIC PANEL - Abnormal; Notable for the following:    Sodium 134 (*)    Glucose, Bld 105 (*)    BUN 24 (*)    Alkaline Phosphatase 140 (*)    GFR calc non Af Amer 51 (*)    GFR calc Af Amer 59 (*)    All other components within normal limits  LIPASE, BLOOD  URINALYSIS, ROUTINE W REFLEX MICROSCOPIC  CEA  CA 125  CANCER ANTIGEN 19-9  MAGNESIUM  PHOSPHORUS  TSH  COMPREHENSIVE METABOLIC  PANEL  CBC  CBC  CREATININE, SERUM  OCCULT BLOOD X 1 CARD TO LAB, STOOL  PROTIME-INR  TYPE AND SCREEN    Imaging Review Ct Abdomen Pelvis W Contrast  03/01/2015   CLINICAL DATA:  Right flank pain which began 10 days ago. Progressively worsening. Bladder cancer diagnosed 02/03/2015  EXAM: CT ABDOMEN AND PELVIS WITH CONTRAST  TECHNIQUE: Multidetector CT imaging of the abdomen and pelvis was performed using the standard protocol following bolus administration of intravenous contrast.  CONTRAST:  184mL OMNIPAQUE IOHEXOL 300 MG/ML SOLN, 71mL OMNIPAQUE IOHEXOL 300 MG/ML SOLN  COMPARISON:  09/24/2014  FINDINGS: Lower chest: The lung bases are clear of acute process. Minimal dependent atelectasis and basilar scarring changes. The heart is normal in size coronary artery calcifications are noted. Moderate aortic calcifications. There is a small hiatal hernia.  Hepatobiliary: The E liver demonstrates a bizarre enhancement pattern due to portal vein thrombosis also demonstrated on the prior study. The right hepatic lobe liver lesions seen on the prior study have coalesced into a large mass occupying the inferior aspect of the right hepatic lobe.  Pancreas: Stable fatty changes.  No mass or inflammation.  Spleen: Normal size.  No focal lesions.  Adrenals/Urinary Tract: Stable right adrenal gland angioma mild lipoma. The kidneys are unremarkable.  Stomach/Bowel: The stomach is unremarkable.  There is a large duodenum mass likely accounting for the patient's hepatic metastatic disease. There is also now bulky portal, peripancreatic and celiac axis lymphadenopathy. A necrotic appearing node measures 4.1 x 2.7 cm on image number 24. The duodenum mass measures approximately 4.3 x 4.3 cm. The small bowel and colon are unremarkable. No inflammation or obstruction.  Vascular/Lymphatic: There is left-sided retroperitoneal lymphadenopathy in addition to the upper abdominal adenopathy surrounding the duodenum. The aorta  demonstrates stable atherosclerotic calcifications. No focal aneurysm.  Pelvis: Without obvious discrete bladder mass. Prostate gland is enlarged. The seminal vesicles are unremarkable. No pelvic lymphadenopathy. No inguinal lymphadenopathy. A left inguinal hernia is noted containing fat. There is a lesion in the left ileus psoas muscle suspicious for a muscle metastasis. A liquified hematoma is also possible.  Musculoskeletal: No obvious destructive metastatic bone lesions. There is a stable sclerotic lesion involving the right acetabular. Vague lucencies are noted in the left iliac bone anteriorly.  IMPRESSION: 1. Bulky duodenal mass with significant adjacent right upper quadrant lymphadenopathy. There is also hepatic metastatic disease, retroperitoneal lymphadenopathy and portal vein thrombosis. 2. Possible metastatic lesion involving the left ileal psoas muscle. A liquified hematoma is also possible. 3. Stable small lucencies in the left iliac bone. No obvious osseous metastatic disease.   Electronically Signed   By: Marijo Sanes M.D.   On: 03/01/2015 20:12     EKG Interpretation None      MDM   Final diagnoses:  Abdominal mass    Patient has evidence of metastatic disease in his abdomen. He does have a significant amount of pain on palpation of his abdomen. I feel that he needs to be admitted for pain management and further inpatient evaluation.  I spoke with Dr. Roel Cluck who will admit the pt.    Malvin Johns, MD 03/01/15 2322

## 2015-03-02 DIAGNOSIS — C801 Malignant (primary) neoplasm, unspecified: Secondary | ICD-10-CM

## 2015-03-02 DIAGNOSIS — I81 Portal vein thrombosis: Secondary | ICD-10-CM

## 2015-03-02 DIAGNOSIS — C787 Secondary malignant neoplasm of liver and intrahepatic bile duct: Secondary | ICD-10-CM

## 2015-03-02 DIAGNOSIS — E039 Hypothyroidism, unspecified: Secondary | ICD-10-CM | POA: Diagnosis present

## 2015-03-02 DIAGNOSIS — K3189 Other diseases of stomach and duodenum: Secondary | ICD-10-CM

## 2015-03-02 DIAGNOSIS — R1011 Right upper quadrant pain: Secondary | ICD-10-CM

## 2015-03-02 DIAGNOSIS — E43 Unspecified severe protein-calorie malnutrition: Secondary | ICD-10-CM | POA: Diagnosis present

## 2015-03-02 LAB — URINALYSIS, ROUTINE W REFLEX MICROSCOPIC
BILIRUBIN URINE: NEGATIVE
Glucose, UA: NEGATIVE mg/dL
HGB URINE DIPSTICK: NEGATIVE
Ketones, ur: NEGATIVE mg/dL
NITRITE: NEGATIVE
PH: 5.5 (ref 5.0–8.0)
Protein, ur: NEGATIVE mg/dL
Specific Gravity, Urine: 1.022 (ref 1.005–1.030)
Urobilinogen, UA: 1 mg/dL (ref 0.0–1.0)

## 2015-03-02 LAB — CBC
HCT: 33 % — ABNORMAL LOW (ref 39.0–52.0)
HCT: 30.6 % — ABNORMAL LOW (ref 39.0–52.0)
HEMOGLOBIN: 10.2 g/dL — AB (ref 13.0–17.0)
HEMOGLOBIN: 9.5 g/dL — AB (ref 13.0–17.0)
MCH: 26.8 pg (ref 26.0–34.0)
MCH: 27.5 pg (ref 26.0–34.0)
MCHC: 30.9 g/dL (ref 30.0–36.0)
MCHC: 31 g/dL (ref 30.0–36.0)
MCV: 86.8 fL (ref 78.0–100.0)
MCV: 88.4 fL (ref 78.0–100.0)
Platelets: 376 10*3/uL (ref 150–400)
Platelets: 347 10*3/uL (ref 150–400)
RBC: 3.8 MIL/uL — AB (ref 4.22–5.81)
RBC: 3.46 MIL/uL — ABNORMAL LOW (ref 4.22–5.81)
RDW: 20.2 % — ABNORMAL HIGH (ref 11.5–15.5)
RDW: 20.5 % — AB (ref 11.5–15.5)
WBC: 12.1 10*3/uL — ABNORMAL HIGH (ref 4.0–10.5)
WBC: 8.1 10*3/uL (ref 4.0–10.5)

## 2015-03-02 LAB — COMPREHENSIVE METABOLIC PANEL
ALBUMIN: 3 g/dL — AB (ref 3.5–5.2)
ALT: 33 U/L (ref 0–53)
ANION GAP: 10 (ref 5–15)
AST: 26 U/L (ref 0–37)
Alkaline Phosphatase: 122 U/L — ABNORMAL HIGH (ref 39–117)
BUN: 22 mg/dL (ref 6–23)
CHLORIDE: 102 mmol/L (ref 96–112)
CO2: 26 mmol/L (ref 19–32)
Calcium: 9 mg/dL (ref 8.4–10.5)
Creatinine, Ser: 1.19 mg/dL (ref 0.50–1.35)
GFR, EST AFRICAN AMERICAN: 63 mL/min — AB (ref >90)
GFR, EST NON AFRICAN AMERICAN: 54 mL/min — AB (ref >90)
Glucose, Bld: 96 mg/dL (ref 70–99)
POTASSIUM: 3.9 mmol/L (ref 3.5–5.1)
SODIUM: 138 mmol/L (ref 135–145)
Total Bilirubin: 0.9 mg/dL (ref 0.3–1.2)
Total Protein: 5.9 g/dL — ABNORMAL LOW (ref 6.0–8.3)

## 2015-03-02 LAB — TYPE AND SCREEN
ABO/RH(D): A NEG
Antibody Screen: NEGATIVE

## 2015-03-02 LAB — CREATININE, SERUM
Creatinine, Ser: 1.13 mg/dL (ref 0.50–1.35)
GFR calc non Af Amer: 58 mL/min — ABNORMAL LOW (ref >90)
GFR, EST AFRICAN AMERICAN: 67 mL/min — AB (ref >90)

## 2015-03-02 LAB — URINE MICROSCOPIC-ADD ON

## 2015-03-02 LAB — PROTIME-INR
INR: 1.18 (ref 0.00–1.49)
Prothrombin Time: 15.1 seconds (ref 11.6–15.2)

## 2015-03-02 LAB — TSH: TSH: 12.277 u[IU]/mL — ABNORMAL HIGH (ref 0.350–4.500)

## 2015-03-02 LAB — MAGNESIUM: Magnesium: 2 mg/dL (ref 1.5–2.5)

## 2015-03-02 LAB — PHOSPHORUS: Phosphorus: 4.8 mg/dL — ABNORMAL HIGH (ref 2.3–4.6)

## 2015-03-02 MED ORDER — LEVOTHYROXINE SODIUM 25 MCG PO TABS
25.0000 ug | ORAL_TABLET | Freq: Every day | ORAL | Status: DC
  Administered 2015-03-02 – 2015-03-16 (×14): 25 ug via ORAL
  Filled 2015-03-02 (×16): qty 1

## 2015-03-02 MED ORDER — CETYLPYRIDINIUM CHLORIDE 0.05 % MT LIQD
7.0000 mL | Freq: Two times a day (BID) | OROMUCOSAL | Status: DC
  Administered 2015-03-02 – 2015-03-16 (×21): 7 mL via OROMUCOSAL

## 2015-03-02 MED ORDER — CHLORHEXIDINE GLUCONATE 0.12 % MT SOLN
15.0000 mL | Freq: Two times a day (BID) | OROMUCOSAL | Status: DC
  Administered 2015-03-02 – 2015-03-16 (×24): 15 mL via OROMUCOSAL
  Filled 2015-03-02 (×32): qty 15

## 2015-03-02 NOTE — Consult Note (Signed)
Referring Provider: [facility] Primary Care Physician:  [name], MD Primary Gastroenterologist:  Dr.Gessner  Reason for Consultation: duodenal mass, abdominal pain    HPI: James [name] is a 81 y.o. male with a history of nonobstructive coronary artery disease with a DES on  Plavix, chronic diastolic CHF, DVT, portal vein thrombosis,  iron deficiency anemia, bradycardia, carotid bruit, hypercholesterolemia, vasculitis, thrombophlebitis, arthritis, gout, and esophageal varices. Patient had CHF in December 2014 with an ejection fraction 66% aortic sclerosis without significant stenosis. His last cardiac cath was in July 2015. Patient had a DES to LAD. He is on aspirin and Plavix.  In November 2014 he experienced a left leg DVT and was on Xarelto for several months. Several months later was found to be anemic with a level of 7.9 and was transfused and in August 2015 had an EGD that showed portal gastropathy. In September 2015 he was found to have portal vein thrombosis and started on Coumadin and then developed rectal bleeding and was admitted to [facility]. Workup included capsule endoscopy that showed esophageal varices and evidence of portal hypertension. Colonoscopy showed small bowel AVMs as well as portal colopathy. In November he developed hematuria and had a urologic evaluation and was found to have a bladder tumor which was resected. 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 Due to bleeding his Coumadin was stopped on 02/20/2015. EGD was done in February 2016 and showed no bleeding site capsule study in February 2016 showed a site of bleeding in the terminal ileum.  The patient says he developed right-sided abdominal pain approximately 10 days ago that has been fairly constant and becoming more severe. The pain is exacerbated with movement and radiates into the lower abdomen. He has had no associated nausea,  vomiting or change in bowel habits. He has had no bright red blood per rectum or melena. He was seen in the GI office yesterday and advised to go to the emergency room for evaluation. In the ER he had a CT scan that showed a bulky duodenal mass with significant adjacent right upper quadrant lymphadenopathy. There is also hepatic metastatic disease, retroperitoneal lymphadenopathy, and portal vein thrombosis. There is a possible metastatic lesion involving the left iliopsoas muscle as well. Patient reports his appetite has been poor for the past several weeks and he states since the fall he has lost 55 pounds.   Past Medical History  Diagnosis Date  . Bradycardia     Hypertensive  . carotid bruit, right   . hypercholesterolemia     Trig 234/293;takes Simcor daily  . Obesity, morbid   . Vasculitis   . 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 Beryle Beams; Dr. Beryle Beams , normal platelets  . Dermatitis     legs  . Bleeding gums   . Chronic low back pain   . Myalgia and myositis   . Unspecified vitamin D deficiency   . Bladder diverticulum   . Primary osteoarthritis of both knees   . Nephrolithiasis   . History of elevated glucose   . Thrombophlebitis     right forearm  . History of BPH   . History of colonic polyps     [name]  . Epididymitis, left     S/P Epidimectomy  . Splenomegaly 05/15/09    abd U/S NML  . Hypertension, benign     takes Micardis daily  . Neuropathy     acute  .  Bruises easily   . H/O hiatal hernia   . GERD (gastroesophageal reflux disease)     hx of but doesn't take any meds now  . Urinary frequency   . Urinary urgency   . Nocturia   . Urinary leakage   . Abnormality of gait 03/19/2013  . DVT of lower limb, acute 11/19/2013    Peroneal & distal tibial left 11/01/13  . Polio 1941    "mild case"  . CHF (congestive heart failure)     "low grade" (06/22/2014)  . DVT (deep venous thrombosis) 10/2013    LLE  .  Pneumonia, bacterial     RML resolved, spirometry, normal  . Pneumonia 1933  . History of blood transfusion 05/2014  . Anemia 05/2014    "related to Xarelto"  . Iron deficiency anemia 05/2014    "got iron infusion"  . Arthritis     "all over"  . Gout   . Malignant melanoma of ear     right  . Thrombophlebitis of superficial veins of right lower extremity 06/23/2014    Incidental finding doppler 05/30/14  . Shortness of breath   . Esophageal varices without mention of bleeding 08/08/2014  . Portal vein thrombosis     Past Surgical History  Procedure Laterality Date  . Cystoscopy w/ stone manipulation  1950's  . Fem cutaneous nerve entrapment  1977    due to surgery (mild lat anesth of bilat legs)  . Limited pelvis/hip bone scan  04/99    negative  . Bone scan  04/00  . Cataract extraction w/ intraocular lens  implant, bilateral Bilateral ~ 2004  . Melanoma excision Right 06/04    ear; Wide local excision,,with flap  . Stress cardiolite  05/11/04    mild inf. ischemia, EF 71%  . Shoulder surgery Left 06/17/2005    "tore it all to pieces when I fell; not fractured"  . Bronchoscopy  09/08    RML collapse with chronic pneumonia, bx neg (Dr. Joya Gaskins)  . Cyst excision Right 03/07/09    middle finger, DIP Joint Mucoid (Dr. Fredna Dow)  . Eyelid & eyebrow lift  1996  . Cyst excision Right 08/15/09    middle finger, DIP Joint Mucoid Cyst (Dr. Fredna Dow)  . Appendectomy  1950  . Excision of mucoid tumor    . Colonoscopy    . Total knee arthroplasty  01/10/2012    Procedure: TOTAL KNEE ARTHROPLASTY;  Surgeon: Augustin Schooling, MD;  Location: [place];  Service: Orthopedics;  Laterality: Left;  . Surgery scrotal / testicular Left ~ 2000    removal of mass, noncancerous  . Cardiac catheterization  05/23/04    mild plague-statin  . Coronary angioplasty with stent placement  06/22/2014    to proximal LAD  . Inguinal hernia repair Bilateral 1959  . Inguinal hernia repair Left 1970's  .  Esophagogastroduodenoscopy N/A 08/08/2014    Procedure: ESOPHAGOGASTRODUODENOSCOPY (EGD);  Surgeon: Gatha Mayer, MD;  Location: [facility] ENDOSCOPY;  Service: Endoscopy;  Laterality: N/A;  . Colonoscopy N/A 08/08/2014    Procedure: COLONOSCOPY;  Surgeon: Gatha Mayer, MD;  Location: [place];  Service: Endoscopy;  Laterality: N/A;  . Flexible sigmoidoscopy N/A 09/21/2014    Procedure: FLEXIBLE SIGMOIDOSCOPY;  Surgeon: Jerene Bears, MD;  Location: [place];  Service: Gastroenterology;  Laterality: N/A;  . Givens capsule study N/A 09/21/2014    Procedure: GIVENS CAPSULE STUDY;  Surgeon: Jerene Bears, MD;  Location: [facility] ENDOSCOPY;  Service: Endoscopy;  Laterality:  N/A;  . Left and right heart catheterization with coronary angiogram N/A 06/22/2014    Procedure: LEFT AND RIGHT HEART CATHETERIZATION WITH CORONARY ANGIOGRAM;  Surgeon: Larey Dresser, MD;  Location: [facility] CATH LAB;  Service: Cardiovascular;  Laterality: N/A;  . Percutaneous coronary stent intervention (pci-s)  06/22/2014    Procedure: PERCUTANEOUS CORONARY STENT INTERVENTION (PCI-S);  Surgeon: Larey Dresser, MD;  Location: [facility] CATH LAB;  Service: Cardiovascular;;  . Colonoscopy N/A 01/25/2015    Procedure: COLONOSCOPY;  Surgeon: Gatha Mayer, MD;  Location: [facility] ENDOSCOPY;  Service: Endoscopy;  Laterality: N/A;  . Cystoscopy with biopsy Right 02/03/2015    Procedure: CYSTOSCOPY WITH COLD CUP BLADDER BIOPSY CAUTHERIZAITON OF RIGHT BLADDER CANCER AND SATILITE, TURBT RIGHT BLADDER BASE;  Surgeon: Ailene Rud, MD;  Location: [facility] ORS;  Service: Urology;  Laterality: Right;    Prior to Admission medications   Medication Sig Start Date End Date Taking? Authorizing Provider  allopurinol (ZYLOPRIM) 300 MG tablet Take 0.5 tablets (150 mg total) by mouth every morning. 10/31/14  Yes Tonia Ghent, MD  aspirin EC 81 MG tablet Take 81 mg by mouth every evening.    Yes Historical Provider, MD  Cholecalciferol (VITAMIN D3) 2000 UNITS capsule Take  2,000 Units by mouth every morning.    Yes Historical Provider, MD  clopidogrel (PLAVIX) 75 MG tablet Take 75 mg by mouth every morning.    Yes Historical Provider, MD  finasteride (PROSCAR) 5 MG tablet Take 5 mg by mouth daily.   Yes Historical Provider, MD  furosemide (LASIX) 40 MG tablet Take 1 tablet (40 mg total) by mouth daily. 02/28/15  Yes Larey Dresser, MD  iron polysaccharides (NIFEREX) 150 MG capsule Take 150 mg by mouth 2 (two) times daily.   Yes Historical Provider, MD  LIVALO 4 MG TABS TAKE 1 TABLET AT BEDTIME 10/03/14  Yes Jolaine Artist, MD  Multiple Vitamin (MULTIVITAMIN WITH MINERALS) TABS tablet Take 1 tablet by mouth daily.   Yes Historical Provider, MD  OVER THE COUNTER MEDICATION Place 2 drops into both eyes 4 (four) times daily. Wash type eye drop-   Yes Historical Provider, MD  oxyCODONE-acetaminophen (ROXICET) 5-325 MG per tablet Take 1 tablet by mouth every 4 (four) hours as needed for severe pain. 02/03/15  Yes Carolan Clines, MD  pantoprazole (PROTONIX) 40 MG tablet Take 40 mg by mouth at bedtime.   Yes Historical Provider, MD  phenazopyridine (PYRIDIUM) 200 MG tablet Take 1 tablet (200 mg total) by mouth 3 (three) times daily as needed for pain. 02/03/15  Yes Carolan Clines, MD  Probiotic Product (PROBIOTIC & ACIDOPHILUS EX ST PO) Take 1 tablet by mouth every morning.    Yes Historical Provider, MD  Psyllium (METAMUCIL PO) Take 20 mLs by mouth every evening.   Yes Historical Provider, MD  ranitidine (ZANTAC) 150 MG tablet Take 150 mg by mouth at bedtime.   Yes Historical Provider, MD  spironolactone (ALDACTONE) 25 MG tablet Take 1 tablet (25 mg total) by mouth daily. 10/26/14  Yes Larey Dresser, MD  nitroGLYCERIN (NITROSTAT) 0.4 MG SL tablet Place 1 tablet (0.4 mg total) under the tongue every 5 (five) minutes as needed. For chest pain 07/07/14   Larey Dresser, MD    Current Facility-Administered Medications  Medication Dose Route Frequency Provider Last  Rate Last Dose  . 0.9 %  sodium chloride infusion  250 mL Intravenous PRN Toy Baker, MD      . acetaminophen (TYLENOL) tablet  650 mg  650 mg Oral Q6H PRN Toy Baker, MD       Or  . acetaminophen (TYLENOL) suppository 650 mg  650 mg Rectal Q6H PRN Toy Baker, MD      . allopurinol (ZYLOPRIM) tablet 150 mg  150 mg Oral Daily Toy Baker, MD      . antiseptic oral rinse (CPC / CETYLPYRIDINIUM CHLORIDE 0.05%) solution 7 mL  7 mL Mouth Rinse q12n4p Toy Baker, MD      . chlorhexidine (PERIDEX) 0.12 % solution 15 mL  15 mL Mouth Rinse BID Toy Baker, MD   15 mL at 03/02/15 0800  . docusate sodium (COLACE) capsule 100 mg  100 mg Oral BID Toy Baker, MD   100 mg at 03/01/15 2352  . enoxaparin (LOVENOX) injection 40 mg  40 mg Subcutaneous QHS Toy Baker, MD   40 mg at 03/01/15 2353  . famotidine (PEPCID) tablet 10 mg  10 mg Oral Daily Toy Baker, MD      . feeding supplement (ENSURE COMPLETE) (ENSURE COMPLETE) liquid 237 mL  237 mL Oral BID BM Toy Baker, MD      . finasteride (PROSCAR) tablet 5 mg  5 mg Oral Daily Toy Baker, MD      . furosemide (LASIX) tablet 40 mg  40 mg Oral Daily Toy Baker, MD      . HYDROmorphone (DILAUDID) injection 1 mg  1 mg Intravenous Q2H PRN Toy Baker, MD   1 mg at 03/02/15 0339  . iron polysaccharides (NIFEREX) capsule 150 mg  150 mg Oral BID Toy Baker, MD   150 mg at 03/01/15 2352  . ondansetron (ZOFRAN) tablet 4 mg  4 mg Oral Q6H PRN Toy Baker, MD       Or  . ondansetron (ZOFRAN) injection 4 mg  4 mg Intravenous Q6H PRN Toy Baker, MD      . oxyCODONE (Oxy IR/ROXICODONE) immediate release tablet 5-10 mg  5-10 mg Oral Q4H PRN Toy Baker, MD   5 mg at 03/02/15 0339  . pantoprazole (PROTONIX) EC tablet 40 mg  40 mg Oral QHS Toy Baker, MD   40 mg at 03/01/15 2352  . pravastatin (PRAVACHOL) tablet 80 mg  80 mg Oral q1800  Toy Baker, MD      . sodium chloride 0.9 % injection 3 mL  3 mL Intravenous Q12H Toy Baker, MD   3 mL at 03/01/15 2352  . sodium chloride 0.9 % injection 3 mL  3 mL Intravenous PRN Toy Baker, MD      . spironolactone (ALDACTONE) tablet 25 mg  25 mg Oral Daily Toy Baker, MD        Allergies as of 03/01/2015 - Review Complete 03/01/2015  Allergen Reaction Noted  . Sulfa antibiotics Other (See Comments) 06/16/2014  . Aleve [naproxen sodium] Other (See Comments) 07/29/2014  . Rofecoxib Other (See Comments)   . Sulfamethoxazole-trimethoprim Other (See Comments) 06/09/2009  . Tape Other (See Comments) 02/03/2015  . Tramadol hcl Other (See Comments) 12/07/2008  . Neomycin-bacitracin zn-polymyx Rash 06/16/2014  . Neosporin [neomycin-polymyxin-gramicidin] Rash 12/31/2011  . Penicillins Swelling and Rash     Family History  Problem Relation Age of Onset  . Thyroid cancer Mother   . Diabetes Father   . Heart disease Father   . Stroke Brother     cerebral hemm  . Colon cancer Brother   . Anesthesia problems Neg Hx   . Hypotension Neg Hx   . Malignant hyperthermia Neg Hx   .  Pseudochol deficiency Neg Hx   . Prostate cancer Neg Hx   . Heart attack Father     History   Social History  . Marital Status: Widowed    Spouse Name: N/A  . Number of Children: 2  . Years of Education: college   Occupational History  . Retired     1996 [employer]   Social History Main Topics  . Smoking status: Former Smoker -- 2.00 packs/day for 20 years    Types: Cigarettes, Cigars    Quit date: 09/27/1967  . Smokeless tobacco: Never Used  . Alcohol Use: No  . Drug Use: No  . Sexual Activity: Not on file   Other Topics Concern  . Not on file   Social History Narrative   Widowed 2013 after 63+ years of marriage, Wife had CHF and multiple myeloma   [name], daughter in Sports coach, lives with patient   Patient does not get regular exercise   No caffeine    Retired from [employer] ([employer]), retired from [employer]: Gen: Denies any fever, chills, sweats, anorexia, fatigue, weakness, malaise, and sleep disorder. Has wt loss CV: Denies chest pain, angina, palpitations, syncope, orthopnea, PND, peripheral edema, and claudication. Resp: Denies dyspnea at rest, dyspnea with exercise, cough, sputum, wheezing, coughing up blood, and pleurisy. GI: Denies vomiting blood, jaundice, and fecal incontinence.   Denies dysphagia or odynophagia. GU : Denies urinary burning, blood in urine, urinary frequency, urinary hesitancy, nocturnal urination, and urinary incontinence. MS: Denies joint pain, limitation of movement, and swelling, stiffness, low back pain, extremity pain. Denies muscle weakness, cramps, atrophy.  Derm: Denies rash, dry skin, hives, moles, warts, or unhealing ulcers.  Psych: Denies depression, anxiety, memory loss, suicidal ideation, hallucinations, paranoia, and confusion. Heme: Denies bruising, bleeding, and enlarged lymph nodes. Neuro:  Denies any headaches, dizziness, paresthesias. Endo:  Denies any problems with DM, thyroid, adrenal function.  Physical Exam: Vital signs in last 24 hours: Temp:  [97.4 F (36.3 C)-97.8 F (36.6 C)] 97.6 F (36.4 C) (03/10 0733) Pulse Rate:  [58-72] 60 (03/10 0733) Resp:  [16-20] 20 (03/10 0733) BP: (101-168)/(47-70) 106/53 mmHg (03/10 0733) SpO2:  [92 %-100 %] 96 % (03/10 0733) Weight:  [227 lb 11.8 oz (103.3 kg)-229 lb (103.874 kg)] 227 lb 11.8 oz (103.3 kg) (03/09 2233) Last BM Date: 03/01/15 General:   Alert,  Well-developed, well-nourished, pleasant and cooperative in NAD Head:  Normocephalic and atraumatic. Eyes:  Sclera clear, no icterus. Conjunctiva pink. Ears:  Normal auditory acuity. Nose:  No deformity, discharge,  or lesions. Mouth:  No deformity or lesions.   Neck:  Supple; no masses or thyromegaly. Lungs:  Clear throughout to auscultation.   No wheezes,  crackles, or rhonchi . Heart:  Regular rate and rhythm; no murmurs, clicks, rubs,  or gallops. Abdomen:  Soft,Tender right upper quadrant BS active,nonpalp mass or hsm.   Rectal:  Deferred  Msk:  Symmetrical without gross deformities. . Pulses:  Normal pulses noted. Extremities:  Without clubbing or edema. Neurologic:  Alert and  oriented x4;  grossly normal neurologically. Skin:  Intact without significant lesions or rashes.. Psych:  Alert and cooperative. Normal mood and affect.     Lab Results:  Recent Labs  03/01/15 1500 03/01/15 2219 03/02/15 0507  WBC 10.0 12.1* 8.1  HGB 10.5* 10.2* 9.5*  HCT 34.0* 33.0* 30.6*  PLT 382 376 347   BMET  Recent Labs  03/01/15  1500 03/01/15 2219  NA 134*  --   K 3.7  --   CL 101  --   CO2 24  --   GLUCOSE 105*  --   BUN 24*  --   CREATININE 1.26 1.13  CALCIUM 9.2  --    LFT  Recent Labs  03/01/15 1500  PROT 7.4  ALBUMIN 3.7  AST 25  ALT 37  ALKPHOS 140*  BILITOT 0.8   PT/INR  Recent Labs  03/02/15 0507  LABPROT 15.1  INR 1.18   Hepatitis Panel No results for input(s): HEPBSAG, HCVAB, HEPAIGM, HEPBIGM in the last 72 hours.    Studies/Results: Dg Chest 2 View  03/01/2015   CLINICAL DATA:  CHF.  EXAM: CHEST  2 VIEW  COMPARISON:  05/27/2014  FINDINGS: Heart size is normal. Negative for pulmonary edema. Densities at the left lung base and along the left cardiac border appear chronic. Atherosclerotic calcifications in the thoracic aorta. Negative for pleural effusions.  IMPRESSION: No acute chest findings.   Electronically Signed   By: Markus Daft M.D.   On: 03/01/2015 23:44   Ct Abdomen Pelvis W Contrast  03/01/2015   CLINICAL DATA:  Right flank pain which began 10 days ago. Progressively worsening. Bladder cancer diagnosed 02/03/2015  EXAM: CT ABDOMEN AND PELVIS WITH CONTRAST  TECHNIQUE: Multidetector CT imaging of the abdomen and pelvis was performed using the standard protocol following bolus administration of  intravenous contrast.  CONTRAST:  15m OMNIPAQUE IOHEXOL 300 MG/ML SOLN, 527mOMNIPAQUE IOHEXOL 300 MG/ML SOLN  COMPARISON:  09/24/2014  FINDINGS: Lower chest: The lung bases are clear of acute process. Minimal dependent atelectasis and basilar scarring changes. The heart is normal in size coronary artery calcifications are noted. Moderate aortic calcifications. There is a small hiatal hernia.  Hepatobiliary: The E liver demonstrates a bizarre enhancement pattern due to portal vein thrombosis also demonstrated on the prior study. The right hepatic lobe liver lesions seen on the prior study have coalesced into a large mass occupying the inferior aspect of the right hepatic lobe.  Pancreas: Stable fatty changes.  No mass or inflammation.  Spleen: Normal size.  No focal lesions.  Adrenals/Urinary Tract: Stable right adrenal gland angioma mild lipoma. The kidneys are unremarkable.  Stomach/Bowel: The stomach is unremarkable. There is a large duodenum mass likely accounting for the patient's hepatic metastatic disease. There is also now bulky portal, peripancreatic and celiac axis lymphadenopathy. A necrotic appearing node measures 4.1 x 2.7 cm on image number 24. The duodenum mass measures approximately 4.3 x 4.3 cm. The small bowel and colon are unremarkable. No inflammation or obstruction.  Vascular/Lymphatic: There is left-sided retroperitoneal lymphadenopathy in addition to the upper abdominal adenopathy surrounding the duodenum. The aorta demonstrates stable atherosclerotic calcifications. No focal aneurysm.  Pelvis: Without obvious discrete bladder mass. Prostate gland is enlarged. The seminal vesicles are unremarkable. No pelvic lymphadenopathy. No inguinal lymphadenopathy. A left inguinal hernia is noted containing fat. There is a lesion in the left ileus psoas muscle suspicious for a muscle metastasis. A liquified hematoma is also possible.  Musculoskeletal: No obvious destructive metastatic bone lesions.  There is a stable sclerotic lesion involving the right acetabular. Vague lucencies are noted in the left iliac bone anteriorly.  IMPRESSION: 1. Bulky duodenal mass with significant adjacent right upper quadrant lymphadenopathy. There is also hepatic metastatic disease, retroperitoneal lymphadenopathy and portal vein thrombosis. 2. Possible metastatic lesion involving the left ileal psoas muscle. A liquified hematoma is also possible. 3. Stable small  lucencies in the left iliac bone. No obvious osseous metastatic disease.   Electronically Signed   By: Marijo Sanes M.D.   On: 03/01/2015 20:12    IMPRESSION/PLAN: 81 year old male presenting with right-sided abdominal pain found to have a duodenal mass hepatic metastases. Question lymphoma versus duodenal cancer. Patient will need a biopsy for further delineation will likely need liver biopsy as well. We'll need to keep off Plavix for several days that biopsies can be obtained. Will discuss with Dr. Hilarie Fredrickson.  Hypertension, stable on home meds. Chronic diastolic CHF. Continue Lasix. Anemia. Likely from chronic blood loss. Stable at this time. Will monitor H&H.   [place]ellas Guard, [name] 03/02/2015,  Pager [phone]  Reviewed with Dr Hilarie Fredrickson. Pt for EGD with duodenal bx tomorrow morning.

## 2015-03-02 NOTE — Progress Notes (Signed)
INITIAL NUTRITION ASSESSMENT  DOCUMENTATION CODES Per approved criteria  -Severe malnutrition in the context of chronic illness  Pt meets criteria for severe MALNUTRITION in the context of chronic illness as evidenced by 13% wt loss in <3 months and reported po <75% for >1 month.  INTERVENTION: - Ensure Complete po BID, each supplement provides 350 kcal and 13 grams of protein - Magic cup TID with meals, each supplement provides 290 kcal and 9 grams of protein - RD will continue to monitor  NUTRITION DIAGNOSIS: Inadequate oral intake related to loss of appetite as evidenced by wt loss.   Goal: Pt to meet >/= 90% of their estimated nutrition needs   Monitor:  Weight trend, po intake, acceptance of supplements, labs  Reason for Assessment: Malnutrition Screening Tool  81 y.o. 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 Question lymphoma versus duodenal cancer. Patient will need a biopsy for further delineation will likely need liver biopsy as well.   - Pt with 34 lb wt loss in the past 3 months (significant for time frame.) - Pt reports a loss of appetite for the past few months. He finds it hard to eat more than a little at a time. Pt given ideas on ways to increase calories and protein in diet. Will order nutritional supplements according to pt preference.  - Pt with no fat or muscle wasting - labs reviewed  Height: Ht Readings from Last 1 Encounters:  03/01/15 5' 11.5" (1.816 m)    Weight: Wt Readings from Last 1 Encounters:  03/01/15 227 lb 11.8 oz (103.3 kg)    Ideal Body Weight: 76.5 kg  % Ideal Body Weight: 135%  Wt Readings from Last 10 Encounters:  03/01/15 227 lb 11.8 oz (103.3 kg)  03/01/15 229 lb (103.874 kg)  02/20/15 235 lb 14.4 oz (107.004 kg)  02/16/15 227 lb 9.6 oz (103.239 kg)  02/03/15 234 lb 6 oz (106.312 kg)   01/25/15 240 lb (108.863 kg)  01/17/15 240 lb (108.863 kg)  12/09/14 251 lb 9.6 oz (114.125 kg)  12/06/14 254 lb (115.214 kg)  11/29/14 261 lb 8 oz (118.616 kg)    Usual Body Weight: 280 lbs  % Usual Body Weight: 81%  BMI:  Body mass index is 31.32 kg/(m^2).  Estimated Nutritional Needs: Kcal: 2200-2400 Protein: 115-130 g Fluid: 2.3 L/day  Skin: intact  Diet Order: Diet Heart Diet NPO time specified  EDUCATION NEEDS: -Education needs addressed   Intake/Output Summary (Last 24 hours) at 03/02/15 1537 Last data filed at 03/02/15 0900  Gross per 24 hour  Intake    360 ml  Output      0 ml  Net    360 ml    Last BM: prior to admission   Labs:   Recent Labs Lab 03/01/15 1500 03/01/15 2219 03/02/15 0507  NA 134*  --  138  K 3.7  --  3.9  CL 101  --  102  CO2 24  --  26  BUN 24*  --  22  CREATININE 1.26 1.13 1.19  CALCIUM 9.2  --  9.0  MG  --   --  2.0  PHOS  --   --  4.8*  GLUCOSE 105*  --  96    CBG (last 3)  No results for input(s): GLUCAP in the last 72 hours.  Scheduled Meds: . allopurinol  150 mg Oral  Daily  . antiseptic oral rinse  7 mL Mouth Rinse q12n4p  . chlorhexidine  15 mL Mouth Rinse BID  . docusate sodium  100 mg Oral BID  . enoxaparin (LOVENOX) injection  40 mg Subcutaneous QHS  . famotidine  10 mg Oral Daily  . feeding supplement (ENSURE COMPLETE)  237 mL Oral BID BM  . finasteride  5 mg Oral Daily  . furosemide  40 mg Oral Daily  . iron polysaccharides  150 mg Oral BID  . pantoprazole  40 mg Oral QHS  . pravastatin  80 mg Oral q1800  . sodium chloride  3 mL Intravenous Q12H  . spironolactone  25 mg Oral Daily    Continuous Infusions:   Past Medical History  Diagnosis Date  . Bradycardia     Hypertensive  . carotid bruit, right   . hypercholesterolemia     Trig 234/293;takes Simcor daily  . Obesity, morbid   . Vasculitis   . 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 Beryle Beams; Dr.  Beryle Beams , normal platelets  . Dermatitis     legs  . Bleeding gums   . Chronic low back pain   . Myalgia and myositis   . Unspecified vitamin D deficiency   . Bladder diverticulum   . Primary osteoarthritis of both knees   . Nephrolithiasis   . History of elevated glucose   . Thrombophlebitis     right forearm  . History of BPH   . History of colonic polyps     [name]  . Epididymitis, left     S/P Epidimectomy  . Splenomegaly 05/15/09    abd U/S NML  . Hypertension, benign     takes Micardis daily  . Neuropathy     acute  . Bruises easily   . H/O hiatal hernia   . GERD (gastroesophageal reflux disease)     hx of but doesn't take any meds now  . Urinary frequency   . Urinary urgency   . Nocturia   . Urinary leakage   . Abnormality of gait 03/19/2013  . DVT of lower limb, acute 11/19/2013    Peroneal & distal tibial left 11/01/13  . Polio 1941    "mild case"  . CHF (congestive heart failure)     "low grade" (06/22/2014)  . DVT (deep venous thrombosis) 10/2013    LLE  . Pneumonia, bacterial     RML resolved, spirometry, normal  . Pneumonia 1933  . History of blood transfusion 05/2014  . Anemia 05/2014    "related to Xarelto"  . Iron deficiency anemia 05/2014    "got iron infusion"  . Arthritis     "all over"  . Gout   . Malignant melanoma of ear     right  . Thrombophlebitis of superficial veins of right lower extremity 06/23/2014    Incidental finding doppler 05/30/14  . Shortness of breath   . Esophageal varices without mention of bleeding 08/08/2014  . Portal vein thrombosis     Past Surgical History  Procedure Laterality Date  . Cystoscopy w/ stone manipulation  1950's  . Fem cutaneous nerve entrapment  1977    due to surgery (mild lat anesth of bilat legs)  . Limited pelvis/hip bone scan  04/99    negative  . Bone scan  04/00  . Cataract extraction w/ intraocular lens  implant, bilateral Bilateral ~ 2004  . Melanoma excision Right 06/04    ear;  Wide local excision,,with flap  . Stress cardiolite  05/11/04    mild inf. ischemia, EF 71%  . Shoulder surgery Left 06/17/2005    "tore it all to pieces when I fell; not fractured"  . Bronchoscopy  09/08    RML collapse with chronic pneumonia, bx neg (Dr. Joya Gaskins)  . Cyst excision Right 03/07/09    middle finger, DIP Joint Mucoid (Dr. Fredna Dow)  . Eyelid & eyebrow lift  1996  . Cyst excision Right 08/15/09    middle finger, DIP Joint Mucoid Cyst (Dr. Fredna Dow)  . Appendectomy  1950  . Excision of mucoid tumor    . Colonoscopy    . Total knee arthroplasty  01/10/2012    Procedure: TOTAL KNEE ARTHROPLASTY;  Surgeon: Augustin Schooling, MD;  Location: [facility];  Service: Orthopedics;  Laterality: Left;  . Surgery scrotal / testicular Left ~ 2000    removal of mass, noncancerous  . Cardiac catheterization  05/23/04    mild plague-statin  . Coronary angioplasty with stent placement  06/22/2014    to proximal LAD  . Inguinal hernia repair Bilateral 1959  . Inguinal hernia repair Left 1970's  . Esophagogastroduodenoscopy N/A 08/08/2014    Procedure: ESOPHAGOGASTRODUODENOSCOPY (EGD);  Surgeon: Gatha Mayer, MD;  Location[facility] ENDOSCOPY;  Service: Endoscopy;  Laterality: N/A;  . Colonoscopy N/A 08/08/2014    Procedure: COLONOSCOPY;  Surgeon: Gatha Mayer, MD;  Location: [place];  Service: Endoscopy;  Laterality: N/A;  . Flexible sigmoidoscopy N/A 09/21/2014    Procedure: FLEXIBLE SIGMOIDOSCOPY;  Surgeon: Jerene Bears, MD;  Location: [place];  Service: Gastroenterology;  Laterality: N/A;  . Givens capsule study N/A 09/21/2014    Procedure: GIVENS CAPSULE STUDY;  Surgeon: Jerene Bears, MD;  Location: [facility] ENDOSCOPY;  Service: Endoscopy;  Laterality: N/A;  . Left and right heart catheterization with coronary angiogram N/A 06/22/2014    Procedure: LEFT AND RIGHT HEART CATHETERIZATION WITH CORONARY ANGIOGRAM;  Surgeon: Larey Dresser, MD;  Location: [facility] CATH LAB;  Service: Cardiovascular;  Laterality: N/A;   . Percutaneous coronary stent intervention (pci-s)  06/22/2014    Procedure: PERCUTANEOUS CORONARY STENT INTERVENTION (PCI-S);  Surgeon: Larey Dresser, MD;  Location: [facility] CATH LAB;  Service: Cardiovascular;;  . Colonoscopy N/A 01/25/2015    Procedure: COLONOSCOPY;  Surgeon: Gatha Mayer, MD;  Location: [facility] ENDOSCOPY;  Service: Endoscopy;  Laterality: N/A;  . Cystoscopy with biopsy Right 02/03/2015    Procedure: CYSTOSCOPY WITH COLD CUP BLADDER BIOPSY CAUTHERIZAITON OF RIGHT BLADDER CANCER AND SATILITE, TURBT RIGHT BLADDER BASE;  Surgeon: Ailene Rud, MD;  Location: [facility] ORS;  Service: Urology;  Laterality: Right;    Laurette Schimke [place], [place], [place]

## 2015-03-02 NOTE — Progress Notes (Signed)
[facility] PROGRESS NOTE  James [name] BPZ:[id number] DOB: Mar 11, 1928 [name]: 03/01/2015 PCP: [name], MD  Summary I have seen and examined James [name] at bedside in the presence of his daughter and reviewed his chart. Appreciate GI/IR. James [name] is a pleasant 81 yo male with history of coronary artery disease, diastolic heart failure, chronic anemia due to chronic blood loss, portal vein thrombosis, who presented with excruciating right low quadrant pain and was found to have new duodenal mass as well as large hepatic masses. Patient is on Plavix for history of drug eluded stent placed in July, but this was held at the time of admission. GI plans EGD with possible biopsy in a.m. Patient is also incidentally found to have hypothyroidism(tsh 12.2). He has leukocytosis whose significance is not entirely clear at this point. Will check Free T4/T3, start low dose Synthroid, check UA and follow GI recommendations. Will inform oncology of patient's admission tomorrow. Patient and daughter hope to follow with Dr. Benay Spice. Plan Duodenal mass/Hepatic metastases/Liver mass/Abdominal pain/Cancer related pain/Portal vein thrombosis  For EGD in a.m.  Defer management to GI. Will inform Dr. Benay Spice of patient's admission. Hypothyroidism  Check free T4/T3  Synthroid HYPERTENSION, BENIGN/CAD, NATIVE VESSEL/Chronic diastolic CHF (congestive heart failure)  Will hold Plavix in view of pending intervention otherwise continue home medications Anemia due to chronic blood loss/Protein-calorie malnutrition, severe  Monitor hemoglobin  Supportive care Code Status: Full Family Communication: Daughter at the bedside Disposition Plan: Eventually home   Consultants:  GI  Interventional cardiology  Oncology  Procedures:  For EGD on 03/03/2015  Antibiotics:  None  HPI/Subjective: Complains of some abdominal pain.  Objective: Filed Vitals:   03/02/15 1132  BP: 113/48  Pulse: 54   Temp: 98 F (36.7 C)  Resp: 20    Intake/Output Summary (Last 24 hours) at 03/02/15 1843 Last data filed at 03/02/15 0900  Gross per 24 hour  Intake    360 ml  Output      0 ml  Net    360 ml   Filed Weights   03/01/15 2233  Weight: 103.3 kg (227 lb 11.8 oz)    Exam:   General:  Comfortable at rest.  Cardiovascular: S1-S2 normal. No murmurs. Pulse regular.  Respiratory: Good air entry bilaterally. No rhonchi or rales.  Abdomen: Soft but tender to palpation right upper quadrant. Normal bowel sounds.   Musculoskeletal: No pedal edema   Neurological: Intact  Data Reviewed: Basic Metabolic Panel:  Recent Labs Lab 03/01/15 1500 03/01/15 2219 03/02/15 0507  NA 134*  --  138  K 3.7  --  3.9  CL 101  --  102  CO2 24  --  26  GLUCOSE 105*  --  96  BUN 24*  --  22  CREATININE 1.26 1.13 1.19  CALCIUM 9.2  --  9.0  MG  --   --  2.0  PHOS  --   --  4.8*   Liver Function Tests:  Recent Labs Lab 03/01/15 1500 03/02/15 0507  AST 25 26  ALT 37 33  ALKPHOS 140* 122*  BILITOT 0.8 0.9  PROT 7.4 5.9*  ALBUMIN 3.7 3.0*    Recent Labs Lab 03/01/15 1500  LIPASE 21   No results for input(s): AMMONIA in the last 168 hours. CBC:  Recent Labs Lab 03/01/15 1500 03/01/15 2219 03/02/15 0507  WBC 10.0 12.1* 8.1  NEUTROABS 7.3  --   --   HGB 10.5* 10.2* 9.5*  HCT 34.0* 33.0*  30.6*  MCV 87.4 86.8 88.4  PLT 382 376 347   Cardiac Enzymes: No results for input(s): CKTOTAL, CKMB, CKMBINDEX, TROPONINI in the last 168 hours. BNP (last 3 results) No results for input(s): BNP in the last 8760 hours.  ProBNP (last 3 results)  Recent Labs  05/29/14 1910 06/06/14 1244 11/29/14 0944  PROBNP 36.3 83.0 114.6    CBG: No results for input(s): GLUCAP in the last 168 hours.  No results found for this or any previous visit (from the past 240 hour(s)).   Studies: Dg Chest 2 View  03/01/2015   CLINICAL DATA:  CHF.  EXAM: CHEST  2 VIEW  COMPARISON:  05/27/2014   FINDINGS: Heart size is normal. Negative for pulmonary edema. Densities at the left lung base and along the left cardiac border appear chronic. Atherosclerotic calcifications in the thoracic aorta. Negative for pleural effusions.  IMPRESSION: No acute chest findings.   Electronically Signed   By: Markus Daft M.D.   On: 03/01/2015 23:44   Ct Abdomen Pelvis W Contrast  03/01/2015   CLINICAL DATA:  Right flank pain which began 10 days ago. Progressively worsening. Bladder cancer diagnosed 02/03/2015  EXAM: CT ABDOMEN AND PELVIS WITH CONTRAST  TECHNIQUE: Multidetector CT imaging of the abdomen and pelvis was performed using the standard protocol following bolus administration of intravenous contrast.  CONTRAST:  156mL OMNIPAQUE IOHEXOL 300 MG/ML SOLN, 64mL OMNIPAQUE IOHEXOL 300 MG/ML SOLN  COMPARISON:  09/24/2014  FINDINGS: Lower chest: The lung bases are clear of acute process. Minimal dependent atelectasis and basilar scarring changes. The heart is normal in size coronary artery calcifications are noted. Moderate aortic calcifications. There is a small hiatal hernia.  Hepatobiliary: The E liver demonstrates a bizarre enhancement pattern due to portal vein thrombosis also demonstrated on the prior study. The right hepatic lobe liver lesions seen on the prior study have coalesced into a large mass occupying the inferior aspect of the right hepatic lobe.  Pancreas: Stable fatty changes.  No mass or inflammation.  Spleen: Normal size.  No focal lesions.  Adrenals/Urinary Tract: Stable right adrenal gland angioma mild lipoma. The kidneys are unremarkable.  Stomach/Bowel: The stomach is unremarkable. There is a large duodenum mass likely accounting for the patient's hepatic metastatic disease. There is also now bulky portal, peripancreatic and celiac axis lymphadenopathy. A necrotic appearing node measures 4.1 x 2.7 cm on image number 24. The duodenum mass measures approximately 4.3 x 4.3 cm. The small bowel and colon are  unremarkable. No inflammation or obstruction.  Vascular/Lymphatic: There is left-sided retroperitoneal lymphadenopathy in addition to the upper abdominal adenopathy surrounding the duodenum. The aorta demonstrates stable atherosclerotic calcifications. No focal aneurysm.  Pelvis: Without obvious discrete bladder mass. Prostate gland is enlarged. The seminal vesicles are unremarkable. No pelvic lymphadenopathy. No inguinal lymphadenopathy. A left inguinal hernia is noted containing fat. There is a lesion in the left ileus psoas muscle suspicious for a muscle metastasis. A liquified hematoma is also possible.  Musculoskeletal: No obvious destructive metastatic bone lesions. There is a stable sclerotic lesion involving the right acetabular. Vague lucencies are noted in the left iliac bone anteriorly.  IMPRESSION: 1. Bulky duodenal mass with significant adjacent right upper quadrant lymphadenopathy. There is also hepatic metastatic disease, retroperitoneal lymphadenopathy and portal vein thrombosis. 2. Possible metastatic lesion involving the left ileal psoas muscle. A liquified hematoma is also possible. 3. Stable small lucencies in the left iliac bone. No obvious osseous metastatic disease.   Electronically Signed   By:  Marijo Sanes M.D.   On: 03/01/2015 20:12    Scheduled Meds: . allopurinol  150 mg Oral Daily  . antiseptic oral rinse  7 mL Mouth Rinse q12n4p  . chlorhexidine  15 mL Mouth Rinse BID  . docusate sodium  100 mg Oral BID  . famotidine  10 mg Oral Daily  . feeding supplement (ENSURE COMPLETE)  237 mL Oral BID BM  . finasteride  5 mg Oral Daily  . furosemide  40 mg Oral Daily  . iron polysaccharides  150 mg Oral BID  . levothyroxine  25 mcg Oral QAC breakfast  . pantoprazole  40 mg Oral QHS  . pravastatin  80 mg Oral q1800  . sodium chloride  3 mL Intravenous Q12H  . spironolactone  25 mg Oral Daily   Continuous Infusions:    Time spent: 25 minutes    James [name]  [facility] Pager [phone]. If 7PM-7AM, please contact night-coverage at [email or web address], password [facility] 03/02/2015, 6:43 PM  LOS: 1 day

## 2015-03-03 ENCOUNTER — Encounter (HOSPITAL_COMMUNITY)
Admission: EM | Disposition: A | Discharge status: Home Health | Payer: Self-pay | Source: Home / Self Care | Attending: Internal Medicine

## 2015-03-03 ENCOUNTER — Encounter (HOSPITAL_COMMUNITY): Payer: Self-pay

## 2015-03-03 DIAGNOSIS — N390 Urinary tract infection, site not specified: Secondary | ICD-10-CM | POA: Diagnosis present

## 2015-03-03 DIAGNOSIS — K315 Obstruction of duodenum: Secondary | ICD-10-CM | POA: Insufficient documentation

## 2015-03-03 HISTORY — PX: ESOPHAGOGASTRODUODENOSCOPY: SHX5428

## 2015-03-03 LAB — T4, FREE: Free T4: 0.94 ng/dL (ref 0.80–1.80)

## 2015-03-03 LAB — COMPREHENSIVE METABOLIC PANEL
ALBUMIN: 3 g/dL — AB (ref 3.5–5.2)
ALT: 33 U/L (ref 0–53)
ANION GAP: 9 (ref 5–15)
AST: 24 U/L (ref 0–37)
Alkaline Phosphatase: 124 U/L — ABNORMAL HIGH (ref 39–117)
BILIRUBIN TOTAL: 0.4 mg/dL (ref 0.3–1.2)
BUN: 19 mg/dL (ref 6–23)
CHLORIDE: 102 mmol/L (ref 96–112)
CO2: 26 mmol/L (ref 19–32)
CREATININE: 1.04 mg/dL (ref 0.50–1.35)
Calcium: 9.1 mg/dL (ref 8.4–10.5)
GFR calc Af Amer: 74 mL/min — ABNORMAL LOW (ref >90)
GFR, EST NON AFRICAN AMERICAN: 64 mL/min — AB (ref >90)
Glucose, Bld: 116 mg/dL — ABNORMAL HIGH (ref 70–99)
Potassium: 4.4 mmol/L (ref 3.5–5.1)
Sodium: 137 mmol/L (ref 135–145)
Total Protein: 6.2 g/dL (ref 6.0–8.3)

## 2015-03-03 LAB — CBC WITH DIFFERENTIAL/PLATELET
Basophils Absolute: 0 10*3/uL (ref 0.0–0.1)
Basophils Relative: 0 % (ref 0–1)
EOS ABS: 0.2 10*3/uL (ref 0.0–0.7)
Eosinophils Relative: 2 % (ref 0–5)
HEMATOCRIT: 30.6 % — AB (ref 39.0–52.0)
HEMOGLOBIN: 9.5 g/dL — AB (ref 13.0–17.0)
Lymphocytes Relative: 12 % (ref 12–46)
Lymphs Abs: 1.1 10*3/uL (ref 0.7–4.0)
MCH: 27.2 pg (ref 26.0–34.0)
MCHC: 31 g/dL (ref 30.0–36.0)
MCV: 87.7 fL (ref 78.0–100.0)
MONO ABS: 1 10*3/uL (ref 0.1–1.0)
MONOS PCT: 10 % (ref 3–12)
Neutro Abs: 7.2 10*3/uL (ref 1.7–7.7)
Neutrophils Relative %: 76 % (ref 43–77)
Platelets: 313 10*3/uL (ref 150–400)
RBC: 3.49 MIL/uL — ABNORMAL LOW (ref 4.22–5.81)
RDW: 20.2 % — AB (ref 11.5–15.5)
WBC: 9.4 10*3/uL (ref 4.0–10.5)

## 2015-03-03 LAB — CEA: CEA: 1.4 ng/mL (ref 0.0–4.7)

## 2015-03-03 LAB — CANCER ANTIGEN 19-9: CA 19 9: 2 U/mL (ref 0–35)

## 2015-03-03 LAB — CA 125: CA 125: 54.2 U/mL — ABNORMAL HIGH (ref 0.0–34.0)

## 2015-03-03 LAB — PROTIME-INR
INR: 1.15 (ref 0.00–1.49)
Prothrombin Time: 14.8 seconds (ref 11.6–15.2)

## 2015-03-03 SURGERY — EGD (ESOPHAGOGASTRODUODENOSCOPY)
Anesthesia: Moderate Sedation

## 2015-03-03 MED ORDER — SODIUM CHLORIDE 0.9 % IV SOLN
INTRAVENOUS | Status: DC
  Administered 2015-03-03: 500 mL via INTRAVENOUS
  Administered 2015-03-03 – 2015-03-05 (×2): via INTRAVENOUS

## 2015-03-03 MED ORDER — BUTAMBEN-TETRACAINE-BENZOCAINE 2-2-14 % EX AERO
INHALATION_SPRAY | CUTANEOUS | Status: DC | PRN
  Administered 2015-03-03: 2 via TOPICAL

## 2015-03-03 MED ORDER — MIDAZOLAM HCL 10 MG/2ML IJ SOLN
INTRAMUSCULAR | Status: DC | PRN
  Administered 2015-03-03: 1 mg via INTRAVENOUS
  Administered 2015-03-03 (×2): 2 mg via INTRAVENOUS

## 2015-03-03 MED ORDER — LEVOFLOXACIN IN D5W 750 MG/150ML IV SOLN
750.0000 mg | Freq: Every day | INTRAVENOUS | Status: DC
  Administered 2015-03-03 – 2015-03-08 (×6): 750 mg via INTRAVENOUS
  Filled 2015-03-03 (×6): qty 150

## 2015-03-03 MED ORDER — DIPHENHYDRAMINE HCL 50 MG/ML IJ SOLN
INTRAMUSCULAR | Status: AC
  Filled 2015-03-03: qty 1

## 2015-03-03 MED ORDER — FENTANYL CITRATE 0.05 MG/ML IJ SOLN
INTRAMUSCULAR | Status: DC | PRN
  Administered 2015-03-03 (×3): 25 ug via INTRAVENOUS

## 2015-03-03 MED ORDER — FENTANYL CITRATE 0.05 MG/ML IJ SOLN
INTRAMUSCULAR | Status: AC
  Filled 2015-03-03: qty 2

## 2015-03-03 MED ORDER — MIDAZOLAM HCL 10 MG/2ML IJ SOLN
INTRAMUSCULAR | Status: AC
  Filled 2015-03-03: qty 2

## 2015-03-03 MED ORDER — MORPHINE SULFATE ER 15 MG PO TBCR
15.0000 mg | EXTENDED_RELEASE_TABLET | Freq: Two times a day (BID) | ORAL | Status: DC
  Administered 2015-03-03 – 2015-03-14 (×18): 15 mg via ORAL
  Filled 2015-03-03 (×20): qty 1

## 2015-03-03 NOTE — Progress Notes (Signed)
Agree with Ms. Guenther's assessment and plan. Carl E. Gessner, MD, FACG   

## 2015-03-03 NOTE — Op Note (Signed)
[place], [phone]   ENDOSCOPY PROCEDURE REPORT  PATIENT: James [name], James [name]  MR#: [id number] BIRTHDATE: 08-02-1928 , 86  yrs. old GENDER: male ENDOSCOPIST: Jerene Bears, MD REFERRED BY:  [facility] PROCEDURE DATE:  03/03/2015 PROCEDURE:  EGD w/ biopsy ASA CLASS:     Class III INDICATIONS:  abnormal CT of the GI tract and abdominal pain in the upper right quadrant. MEDICATIONS: Fentanyl 75 mcg IV and Versed 5 mg IV TOPICAL ANESTHETIC: Cetacaine Spray  DESCRIPTION OF PROCEDURE: After the risks benefits and alternatives of the procedure were thoroughly explained, informed consent was obtained.  The Pentax Gastroscope [id number] endoscope was introduced through the mouth and advanced to the second portion of the duodenum , Without limitations.  The instrument was slowly withdrawn as the mucosa was fully examined.   ESOPHAGUS: There were small varices in the lower third of the esophagus.  The varices were not bleeding.   There was food residue in the esophagus  STOMACH: There was a large amount of residual food.  Due to the residual food, complete mucosal examination could not be performed. No abnormal gastric mucosa was seen.  DUODENUM: A fungating mass with friable surfaces was found in the duodenal bulb, immediately past the pylorus.  There was significant food residue in the bulb, which also limited the exam.  Due to the presence of food and luminal narrowing the scope was unable to be advanced into the 2nd portion of the duodenum. Multiple biopsies were performed using cold forceps.  Retroflexed views revealed a hiatal hernia.     The scope was then withdrawn from the patient and the procedure completed.  COMPLICATIONS: There were no immediate complications.   ENDOSCOPIC IMPRESSION: 1.   Small distal esophageal varices without bleeding 2.   Food residue in the esophagus secondary to reflux in the setting of partial  duodenal obstruction 3.   Food in the stomach due to partial duodenal obstruction 4.   Small hiatal hernia and no gastric abnormality seen 5.   Fungating soft and friable mass in the duodenal bulb with significant luminal narrowing. Visualization limited by food residue. Multiple biopsies taken  RECOMMENDATIONS: 1.  Await biopsy results 2.  Hold Plavix 3.  Liquid diet 4.  Monitor Hgb  eSigned:  Jerene Bears, MD 03/03/2015 8:10 AM    UP:JSRP Carlean Purl, MD, Murriel Hopper, MD, and The Patient  PATIENT NAME:  [name], [name] MR#: [id number]

## 2015-03-03 NOTE — Progress Notes (Signed)
[facility] PROGRESS NOTE  James [name]:[id number] DOB: [birth year] [name]: 03/01/2015 PCP: [name], MD  Summary I have seen and examined James [name] at bedside in the presence of his daughters and reviewed his chart. Appreciate GI/IR. Spoke with Dr Benay Spice who will see patient later. James [name] is a pleasant 81 yo male with history of coronary artery disease, diastolic heart failure, chronic anemia due to chronic blood loss, portal vein thrombosis, who presented with excruciating right low quadrant pain and was found to have new duodenal mass as well as large hepatic masses. Patient was on Plavix for history of drug eluded stent placed in July, but this was held at the time of admission. GI perfomed EGD with biopsy on 03/03/15. Incidentally, patient was found to have hypothyroidism(tsh 12.2). He has UTI with leukocytosis. Free T4 is normal. Patient was started on low dose Synthroid. He seems to have some confusion-?due to UTI, if doesn't clear up, ?worry about brain mets. Will add Levaquin/check Ammonia level. Cons Plan Duodenal mass/Hepatic metastases/Liver mass/Abdominal pain/Cancer related pain/Portal vein thrombosis  Follow pathology  Defer management to GI/Dr. Benay Spice Hypothyroidism  Continue Synthroid  Needs follow up TFTs in 6 weeks. HYPERTENSION, BENIGN/CAD, NATIVE VESSEL/Chronic diastolic CHF (congestive heart failure)  Will hold Plavix in view of pending intervention otherwise continue home medications Anemia due to chronic blood loss/Protein-calorie malnutrition, severe  Monitor hemoglobin  Supportive care UTI  Follow urine culture.  Levaquin to complete 7 days. Code Status: Full Family Communication: Daughter at the bedside Disposition Plan: Eventually home   Consultants:  GI  Interventional cardiology  Oncology  Procedures:  EGD on 03/03/2015  Antibiotics:  Levaquin 03/03/15.  HPI/Subjective: Complains of some abdominal pain, not very  specific in his responses.  Objective: Filed Vitals:   03/03/15 1415  BP: 128/46  Pulse: 64  Temp: 97.5 F (36.4 C)  Resp: 18    Intake/Output Summary (Last 24 hours) at 03/03/15 1524 Last data filed at 03/03/15 0800  Gross per 24 hour  Intake    220 ml  Output    400 ml  Net   -180 ml   Filed Weights   03/01/15 2233  Weight: 103.3 kg (227 lb 11.8 oz)    Exam:   General:  Comfortable at rest.  Cardiovascular: S1-S2 normal. No murmurs. Pulse regular.  Respiratory: Good air entry bilaterally. No rhonchi or rales.  Abdomen: Soft and tender right side. Normal bowel sounds. No organomegaly.  Musculoskeletal: No pedal edema   Neurological: Intact  Data Reviewed: Basic Metabolic Panel:  Recent Labs Lab 03/01/15 1500 03/01/15 2219 03/02/15 0507 03/03/15 0535  NA 134*  --  138 137  K 3.7  --  3.9 4.4  CL 101  --  102 102  CO2 24  --  26 26  GLUCOSE 105*  --  96 116*  BUN 24*  --  22 19  CREATININE 1.26 1.13 1.19 1.04  CALCIUM 9.2  --  9.0 9.1  MG  --   --  2.0  --   PHOS  --   --  4.8*  --    Liver Function Tests:  Recent Labs Lab 03/01/15 1500 03/02/15 0507 03/03/15 0535  AST 25 26 24   ALT 37 33 33  ALKPHOS 140* 122* 124*  BILITOT 0.8 0.9 0.4  PROT 7.4 5.9* 6.2  ALBUMIN 3.7 3.0* 3.0*    Recent Labs Lab 03/01/15 1500  LIPASE 21   No results for input(s): AMMONIA in the last  168 hours. CBC:  Recent Labs Lab 03/01/15 1500 03/01/15 2219 03/02/15 0507 03/03/15 0535  WBC 10.0 12.1* 8.1 9.4  NEUTROABS 7.3  --   --  7.2  HGB 10.5* 10.2* 9.5* 9.5*  HCT 34.0* 33.0* 30.6* 30.6*  MCV 87.4 86.8 88.4 87.7  PLT 382 376 347 313   Cardiac Enzymes: No results for input(s): CKTOTAL, CKMB, CKMBINDEX, TROPONINI in the last 168 hours. BNP (last 3 results) No results for input(s): BNP in the last 8760 hours.  ProBNP (last 3 results)  Recent Labs  05/29/14 1910 06/06/14 1244 11/29/14 0944  PROBNP 36.3 83.0 114.6    CBG: No results for  input(s): GLUCAP in the last 168 hours.  No results found for this or any previous visit (from the past 240 hour(s)).   Studies: Dg Chest 2 View  03/01/2015   CLINICAL DATA:  CHF.  EXAM: CHEST  2 VIEW  COMPARISON:  05/27/2014  FINDINGS: Heart size is normal. Negative for pulmonary edema. Densities at the left lung base and along the left cardiac border appear chronic. Atherosclerotic calcifications in the thoracic aorta. Negative for pleural effusions.  IMPRESSION: No acute chest findings.   Electronically Signed   By: Markus Daft M.D.   On: 03/01/2015 23:44   Ct Abdomen Pelvis W Contrast  03/01/2015   CLINICAL DATA:  Right flank pain which began 10 days ago. Progressively worsening. Bladder cancer diagnosed 02/03/2015  EXAM: CT ABDOMEN AND PELVIS WITH CONTRAST  TECHNIQUE: Multidetector CT imaging of the abdomen and pelvis was performed using the standard protocol following bolus administration of intravenous contrast.  CONTRAST:  162mL OMNIPAQUE IOHEXOL 300 MG/ML SOLN, 2mL OMNIPAQUE IOHEXOL 300 MG/ML SOLN  COMPARISON:  09/24/2014  FINDINGS: Lower chest: The lung bases are clear of acute process. Minimal dependent atelectasis and basilar scarring changes. The heart is normal in size coronary artery calcifications are noted. Moderate aortic calcifications. There is a small hiatal hernia.  Hepatobiliary: The E liver demonstrates a bizarre enhancement pattern due to portal vein thrombosis also demonstrated on the prior study. The right hepatic lobe liver lesions seen on the prior study have coalesced into a large mass occupying the inferior aspect of the right hepatic lobe.  Pancreas: Stable fatty changes.  No mass or inflammation.  Spleen: Normal size.  No focal lesions.  Adrenals/Urinary Tract: Stable right adrenal gland angioma mild lipoma. The kidneys are unremarkable.  Stomach/Bowel: The stomach is unremarkable. There is a large duodenum mass likely accounting for the patient's hepatic metastatic disease.  There is also now bulky portal, peripancreatic and celiac axis lymphadenopathy. A necrotic appearing node measures 4.1 x 2.7 cm on image number 24. The duodenum mass measures approximately 4.3 x 4.3 cm. The small bowel and colon are unremarkable. No inflammation or obstruction.  Vascular/Lymphatic: There is left-sided retroperitoneal lymphadenopathy in addition to the upper abdominal adenopathy surrounding the duodenum. The aorta demonstrates stable atherosclerotic calcifications. No focal aneurysm.  Pelvis: Without obvious discrete bladder mass. Prostate gland is enlarged. The seminal vesicles are unremarkable. No pelvic lymphadenopathy. No inguinal lymphadenopathy. A left inguinal hernia is noted containing fat. There is a lesion in the left ileus psoas muscle suspicious for a muscle metastasis. A liquified hematoma is also possible.  Musculoskeletal: No obvious destructive metastatic bone lesions. There is a stable sclerotic lesion involving the right acetabular. Vague lucencies are noted in the left iliac bone anteriorly.  IMPRESSION: 1. Bulky duodenal mass with significant adjacent right upper quadrant lymphadenopathy. There is also hepatic metastatic  disease, retroperitoneal lymphadenopathy and portal vein thrombosis. 2. Possible metastatic lesion involving the left ileal psoas muscle. A liquified hematoma is also possible. 3. Stable small lucencies in the left iliac bone. No obvious osseous metastatic disease.   Electronically Signed   By: Marijo Sanes M.D.   On: 03/01/2015 20:12    Scheduled Meds: . allopurinol  150 mg Oral Daily  . antiseptic oral rinse  7 mL Mouth Rinse q12n4p  . chlorhexidine  15 mL Mouth Rinse BID  . docusate sodium  100 mg Oral BID  . famotidine  10 mg Oral Daily  . feeding supplement (ENSURE COMPLETE)  237 mL Oral BID BM  . finasteride  5 mg Oral Daily  . furosemide  40 mg Oral Daily  . iron polysaccharides  150 mg Oral BID  . levofloxacin (LEVAQUIN) IV  750 mg  Intravenous Daily  . levothyroxine  25 mcg Oral QAC breakfast  . morphine  15 mg Oral Q12H  . pantoprazole  40 mg Oral QHS  . pravastatin  80 mg Oral q1800  . sodium chloride  3 mL Intravenous Q12H  . spironolactone  25 mg Oral Daily   Continuous Infusions: . sodium chloride 20 mL/hr at 03/03/15 0900     Time spent: 25 minutes    Alliya Marcon  [facility] Pager [phone]. If 7PM-7AM, please contact night-coverage at [email or web address], password [facility] 03/03/2015, 3:24 PM  LOS: 2 days

## 2015-03-03 NOTE — Progress Notes (Signed)
Patient requested HPOA and Living Will materials. Came with packet and went over it with him. His daughter and his sister were also in the room. He asked to read over it before he actually signs it. I instructed him to let the nurse know when he is ready to have the paperwork notarized.  Afterwards, they all talked about the death of the patient's spouse from Multiple Myeloma. She had been a patient here at the [facility]. Grief support and listening.  Epifania Gore, PhD, [place]

## 2015-03-04 DIAGNOSIS — R1013 Epigastric pain: Secondary | ICD-10-CM

## 2015-03-04 LAB — URINE CULTURE
CULTURE: NO GROWTH
Colony Count: NO GROWTH

## 2015-03-04 LAB — CBC WITH DIFFERENTIAL/PLATELET
BASOS PCT: 0 % (ref 0–1)
Basophils Absolute: 0 10*3/uL (ref 0.0–0.1)
Eosinophils Absolute: 0.1 10*3/uL (ref 0.0–0.7)
Eosinophils Relative: 1 % (ref 0–5)
HCT: 31.3 % — ABNORMAL LOW (ref 39.0–52.0)
Hemoglobin: 9.6 g/dL — ABNORMAL LOW (ref 13.0–17.0)
Lymphocytes Relative: 6 % — ABNORMAL LOW (ref 12–46)
Lymphs Abs: 0.6 10*3/uL — ABNORMAL LOW (ref 0.7–4.0)
MCH: 27.3 pg (ref 26.0–34.0)
MCHC: 30.7 g/dL (ref 30.0–36.0)
MCV: 88.9 fL (ref 78.0–100.0)
Monocytes Absolute: 0.7 10*3/uL (ref 0.1–1.0)
Monocytes Relative: 7 % (ref 3–12)
Neutro Abs: 8.3 10*3/uL — ABNORMAL HIGH (ref 1.7–7.7)
Neutrophils Relative %: 86 % — ABNORMAL HIGH (ref 43–77)
Platelets: 305 10*3/uL (ref 150–400)
RBC: 3.52 MIL/uL — ABNORMAL LOW (ref 4.22–5.81)
RDW: 20.4 % — AB (ref 11.5–15.5)
WBC: 9.7 10*3/uL (ref 4.0–10.5)

## 2015-03-04 LAB — COMPREHENSIVE METABOLIC PANEL
ALBUMIN: 2.8 g/dL — AB (ref 3.5–5.2)
ALT: 32 U/L (ref 0–53)
AST: 22 U/L (ref 0–37)
Alkaline Phosphatase: 121 U/L — ABNORMAL HIGH (ref 39–117)
Anion gap: 7 (ref 5–15)
BILIRUBIN TOTAL: 0.5 mg/dL (ref 0.3–1.2)
BUN: 15 mg/dL (ref 6–23)
CO2: 26 mmol/L (ref 19–32)
Calcium: 8.8 mg/dL (ref 8.4–10.5)
Chloride: 100 mmol/L (ref 96–112)
Creatinine, Ser: 1.03 mg/dL (ref 0.50–1.35)
GFR, EST AFRICAN AMERICAN: 75 mL/min — AB (ref >90)
GFR, EST NON AFRICAN AMERICAN: 65 mL/min — AB (ref >90)
Glucose, Bld: 125 mg/dL — ABNORMAL HIGH (ref 70–99)
POTASSIUM: 4.7 mmol/L (ref 3.5–5.1)
SODIUM: 133 mmol/L — AB (ref 135–145)
TOTAL PROTEIN: 5.9 g/dL — AB (ref 6.0–8.3)

## 2015-03-04 LAB — T3, FREE: T3 FREE: 2.3 pg/mL (ref 2.0–4.4)

## 2015-03-04 LAB — AMMONIA: Ammonia: 31 umol/L (ref 11–32)

## 2015-03-04 NOTE — Progress Notes (Signed)
[facility] PROGRESS NOTE  James [name] OEV:[id number] DOB: 1928-06-14 [name]: 03/01/2015 PCP: James Stain, MD  Summary Appreciate GI/IR/Oncology. James [name] is a pleasant 81 yo male with history of coronary artery disease, diastolic heart failure, chronic anemia due to chronic blood loss, portal vein thrombosis, urothelial bladder CA(biopsy 02/06/15), who presented with excruciating right low quadrant pain and was found to have new duodenal mass as well as large hepatic masses. Patient was on Plavix for history of drug eluded stent placed in July, but this was held at the time of admission in anticipation of surgical procedures. GI perfomed EGD with biopsy on 03/03/15. Incidentally, patient was found to have hypothyroidism(tsh 12.2). Free T4 is normal. Patient was started on low dose Synthroid. He was also found to have UTI.  He seemed to have some confusion-?due to UTI but this has cleared up today ?if recurrence, worry about brain mets. Urine cultures negative. Will continue Levaquin and await duodenal mass biopsy results. Keep holding Plavix as patient may need more invasive procedures.  Plan Duodenal mass/Hepatic metastases/Liver mass/Abdominal pain/Cancer related pain/Portal vein thrombosis  Follow pathology  Defer management to GI/Dr. Benay [name]  Consider CT brain if evidence of confusion to exclude brain metastases. Hypothyroidism  Continue Synthroid  Needs follow up TFTs in 6 weeks. HYPERTENSION, BENIGN/CAD, NATIVE VESSEL/Chronic diastolic CHF (congestive heart failure)  Will hold Plavix in view of pending intervention otherwise continue home medications Anemia due to chronic blood loss/Protein-calorie malnutrition, severe  Monitor hemoglobin  Supportive care UTI  Levaquin to complete 7 days. Code Status: Full Family Communication: Daughter at the bedside Disposition Plan: Eventually home   Consultants:  GI  Interventional cardiology  Oncology  Procedures:  EGD  on 03/03/2015  Antibiotics:  Levaquin 03/03/15>  HPI/Subjective: Says abdominal pain is better  Objective: Filed Vitals:   03/04/15 0543  BP: 133/48  Pulse: 63  Temp: 98.1 F (36.7 C)  Resp: 18    Intake/Output Summary (Last 24 hours) at 03/04/15 1014 Last data filed at 03/04/15 0600  Gross per 24 hour  Intake    920 ml  Output    200 ml  Net    720 ml   Filed Weights   03/01/15 2233  Weight: 103.3 kg (227 lb 11.8 oz)    Exam:   General:  Comfortable at rest.  Cardiovascular: S1-S2 normal. No murmurs. Pulse regular.  Respiratory: Good air entry bilaterally. No rhonchi or rales.  Abdomen: Soft and nontender. Normal bowel sounds. No organomegaly.  Musculoskeletal: No pedal edema   Neurological: Intact  Data Reviewed: Basic Metabolic Panel:  Recent Labs Lab 03/01/15 1500 03/01/15 2219 03/02/15 0507 03/03/15 0535 03/04/15 0507  NA 134*  --  138 137 133*  K 3.7  --  3.9 4.4 4.7  CL 101  --  102 102 100  CO2 24  --  26 26 26   GLUCOSE 105*  --  96 116* 125*  BUN 24*  --  22 19 15   CREATININE 1.26 1.13 1.19 1.04 1.03  CALCIUM 9.2  --  9.0 9.1 8.8  MG  --   --  2.0  --   --   PHOS  --   --  4.8*  --   --    Liver Function Tests:  Recent Labs Lab 03/01/15 1500 03/02/15 0507 03/03/15 0535 03/04/15 0507  AST 25 26 24 22   ALT 37 33 33 32  ALKPHOS 140* 122* 124* 121*  BILITOT 0.8 0.9 0.4 0.5  PROT 7.4  5.9* 6.2 5.9*  ALBUMIN 3.7 3.0* 3.0* 2.8*    Recent Labs Lab 03/01/15 1500  LIPASE 21    Recent Labs Lab 03/04/15 0507  AMMONIA 31   CBC:  Recent Labs Lab 03/01/15 1500 03/01/15 2219 03/02/15 0507 03/03/15 0535 03/04/15 0507  WBC 10.0 12.1* 8.1 9.4 9.7  NEUTROABS 7.3  --   --  7.2 8.3*  HGB 10.5* 10.2* 9.5* 9.5* 9.6*  HCT 34.0* 33.0* 30.6* 30.6* 31.3*  MCV 87.4 86.8 88.4 87.7 88.9  PLT 382 376 347 313 305   Cardiac Enzymes: No results for input(s): CKTOTAL, CKMB, CKMBINDEX, TROPONINI in the last 168 hours. BNP (last 3  results) No results for input(s): BNP in the last 8760 hours.  ProBNP (last 3 results)  Recent Labs  05/29/14 1910 06/06/14 1244 11/29/14 0944  PROBNP 36.3 83.0 114.6    CBG: No results for input(s): GLUCAP in the last 168 hours.  Recent Results (from the past 240 hour(s))  Culture, Urine     Status: None   Collection Time: 03/02/15  8:40 PM  Result Value Ref Range Status   Specimen Description URINE, CLEAN CATCH  Final   Special Requests NONE  Final   Colony Count NO GROWTH Performed at [facility]   Final   Culture NO GROWTH Performed at [facility]   Final   Report Status 03/04/2015 FINAL  Final     Studies: No results found.  Scheduled Meds: . allopurinol  150 mg Oral Daily  . antiseptic oral rinse  7 mL Mouth Rinse q12n4p  . chlorhexidine  15 mL Mouth Rinse BID  . docusate sodium  100 mg Oral BID  . famotidine  10 mg Oral Daily  . feeding supplement (ENSURE COMPLETE)  237 mL Oral BID BM  . finasteride  5 mg Oral Daily  . furosemide  40 mg Oral Daily  . iron polysaccharides  150 mg Oral BID  . levofloxacin (LEVAQUIN) IV  750 mg Intravenous Daily  . levothyroxine  25 mcg Oral QAC breakfast  . morphine  15 mg Oral Q12H  . pantoprazole  40 mg Oral QHS  . pravastatin  80 mg Oral q1800  . sodium chloride  3 mL Intravenous Q12H  . spironolactone  25 mg Oral Daily   Continuous Infusions: . sodium chloride 20 mL/hr at 03/03/15 0900     Time spent: 15 minutes    James [name]  [facility] Pager [phone]. If 7PM-7AM, please contact night-coverage at [email or web address], password [facility] 03/04/2015, 10:14 AM  LOS: 3 days

## 2015-03-04 NOTE — Progress Notes (Signed)
Progress Note   Subjective  Feels about the same today, still with upper abdominal pain pressure with bloating, worse after breakfast He had full liquid diet ordered by me yesterday, changed to heart healthy this morning. He tried small amount of pancakes and ate fruit No vomiting No rectal bleeding or melena   Objective  Vital signs in last 24 hours: Temp:  [97.5 F (36.4 C)-98.4 F (36.9 C)] 98.1 F (36.7 C) (03/12 0543) Pulse Rate:  [58-71] 63 (03/12 0543) Resp:  [18] 18 (03/12 0543) BP: (110-133)/(46-51) 133/48 mmHg (03/12 0543) SpO2:  [95 %-100 %] 95 % (03/12 0543) Last BM Date: 03/03/15 General:   Alert,  Well-developed,   in NAD Heart:  Regular rate and rhythm; no murmurs Pulm: cta b/l Abdomen:  Soft, tender in the upper abdomen with mild distention. Normal bowel sounds, without guarding, and without rebound.   Extremities:  Trace to 1+ LE edema Neurologic:  Alert and  oriented x4;  grossly normal neurologically. Psych:  Alert and cooperative. Normal mood and affect.  Intake/Output from previous day: 03/11 0701 - 03/12 0700 In: 1170 [P.O.:480; I.V.:540; IV Piggyback:150] Out: 200 [Urine:200] Intake/Output this shift:    Lab Results:  Recent Labs  03/02/15 0507 03/03/15 0535 03/04/15 0507  WBC 8.1 9.4 9.7  HGB 9.5* 9.5* 9.6*  HCT 30.6* 30.6* 31.3*  PLT 347 313 305   BMET  Recent Labs  03/02/15 0507 03/03/15 0535 03/04/15 0507  NA 138 137 133*  K 3.9 4.4 4.7  CL 102 102 100  CO2 26 26 26   GLUCOSE 96 116* 125*  BUN 22 19 15   CREATININE 1.19 1.04 1.03  CALCIUM 9.0 9.1 8.8   LFT  Recent Labs  03/04/15 0507  PROT 5.9*  ALBUMIN 2.8*  AST 22  ALT 32  ALKPHOS 121*  BILITOT 0.5   PT/INR  Recent Labs  03/02/15 0507 03/03/15 0535  LABPROT 15.1 14.8  INR 1.18 1.15     Assessment & Plan  81 yo male with hx of PVT with portal hypertension, occult GI bleeding, CAD, CHF, DVT, IDA, bradycardia, HL, arthritis admitted with RUQ pain and CT  showing duodenal mass with concern for hepatic metastatic disease  1.  Duodenal mass with partial obstruction/liver mets -- EGD yesterday showing fungating though soft tumor in the duodenal bulb. Views obscured by retained food consistent with partial duodenal obstruction. Awaiting pathology results. --Await pathology results --Oncology consult to Dr. Benay Spice once pathology results available, Dr. Benay Spice already knows about the patient and visited with him yesterday --Diet needs to be as tolerated, I reduced to soft/low fiber diet today. If this causes bloating and worsening pain the need to return to full liquids. I stressed to him that he needs to avoid high-fiber foods. Hesitant to start Reglan given the partial obstruction and concern of this worsening pain. Can consider trying it later if necessary --Hemoglobin is stable today without evidence for bleeding. --Continue to hold antiplatelet and anticoagulants   Active Problems:   HYPERTENSION, BENIGN   CAD, NATIVE VESSEL   Chronic diastolic CHF (congestive heart failure)   Portal vein thrombosis   Anemia due to chronic blood loss   Duodenal mass   Abdominal pain   Hepatic metastases   Liver mass   Cancer related pain   CHF (congestive heart failure)   Protein-calorie malnutrition, severe   Hypothyroidism   UTI (urinary tract infection)   Duodenal obstruction, acquired     LOS: 3 days  Jerene Bears  03/04/2015, 9:53 AM

## 2015-03-05 ENCOUNTER — Inpatient Hospital Stay (HOSPITAL_COMMUNITY): Payer: Medicare Other

## 2015-03-05 DIAGNOSIS — Z86718 Personal history of other venous thrombosis and embolism: Secondary | ICD-10-CM

## 2015-03-05 LAB — BASIC METABOLIC PANEL
ANION GAP: 10 (ref 5–15)
BUN: 16 mg/dL (ref 6–23)
CALCIUM: 8.7 mg/dL (ref 8.4–10.5)
CHLORIDE: 100 mmol/L (ref 96–112)
CO2: 25 mmol/L (ref 19–32)
Creatinine, Ser: 1.02 mg/dL (ref 0.50–1.35)
GFR calc non Af Amer: 65 mL/min — ABNORMAL LOW (ref >90)
GFR, EST AFRICAN AMERICAN: 76 mL/min — AB (ref >90)
GLUCOSE: 115 mg/dL — AB (ref 70–99)
Potassium: 4.7 mmol/L (ref 3.5–5.1)
Sodium: 135 mmol/L (ref 135–145)

## 2015-03-05 LAB — CBC
HCT: 29.8 % — ABNORMAL LOW (ref 39.0–52.0)
Hemoglobin: 9.2 g/dL — ABNORMAL LOW (ref 13.0–17.0)
MCH: 27.3 pg (ref 26.0–34.0)
MCHC: 30.9 g/dL (ref 30.0–36.0)
MCV: 88.4 fL (ref 78.0–100.0)
PLATELETS: 316 10*3/uL (ref 150–400)
RBC: 3.37 MIL/uL — ABNORMAL LOW (ref 4.22–5.81)
RDW: 20.5 % — ABNORMAL HIGH (ref 11.5–15.5)
WBC: 8.1 10*3/uL (ref 4.0–10.5)

## 2015-03-05 MED ORDER — ONDANSETRON HCL 4 MG/2ML IJ SOLN
4.0000 mg | Freq: Four times a day (QID) | INTRAMUSCULAR | Status: DC
  Administered 2015-03-05 – 2015-03-14 (×32): 4 mg via INTRAVENOUS
  Filled 2015-03-05 (×32): qty 2

## 2015-03-05 MED ORDER — KCL IN DEXTROSE-NACL 20-5-0.45 MEQ/L-%-% IV SOLN
INTRAVENOUS | Status: DC
  Administered 2015-03-05: 19:00:00 via INTRAVENOUS
  Filled 2015-03-05 (×8): qty 1000

## 2015-03-05 MED ORDER — PANTOPRAZOLE SODIUM 40 MG IV SOLR
40.0000 mg | INTRAVENOUS | Status: DC
  Administered 2015-03-05 – 2015-03-14 (×10): 40 mg via INTRAVENOUS
  Filled 2015-03-05 (×11): qty 40

## 2015-03-05 NOTE — Progress Notes (Signed)
[facility] PROGRESS NOTE  James [name] GYI:[id number] DOB: Feb 25, 1928 [name]: 03/01/2015 PCP: [name], MD  Summary Appreciate GI/IR/Oncology. Noted visit by Dr Beryle Beams. James [name] is a pleasant 81 yo male with history of coronary artery disease, diastolic heart failure, chronic anemia due to chronic blood loss, portal vein thrombosis, urothelial bladder CA(biopsy 02/06/15), who presented with excruciating right low quadrant pain and was found to have new fungating duodenal mass as well as large hepatic masses. Patient was on Plavix for history of drug eluded stent placed in July, but this was held at the time of admission in anticipation of surgical procedures. GI perfomed EGD with biopsy on 03/03/15. Incidentally, patient was found to have hypothyroidism(tsh 12.2). Free T4 is normal. Patient was started on low dose Synthroid. He was also found to have UTI. He seemed to have some confusion-?due to UTI but this has cleared up today ?if recurrence, worry about brain mets. Urine cultures negative. Will continue Levaquin and await duodenal mass biopsy results. Keep holding Plavix as patient may need more invasive procedures. Will defer to GI/Oncology/IR regarding further work up. Patient has no specific complaints but mentions he developed hisscups after dinner yesterday. He may retaining food in the stomach because of duodenal mass. Agree with slowing down feeding depending on patient's tolerance. I have discussed this with patient and his daughter. Plan Duodenal mass/Hepatic metastases/Liver mass/Abdominal pain/Cancer related pain/Portal vein thrombosis  Follow pathology  Defer management to GI/Dr. Benay Spice  Consider CT brain if evidence of confusion to exclude brain metastases.  Diet as tolerated. Hypothyroidism  Continue Synthroid  Needs follow up TFTs in 6 weeks. HYPERTENSION, BENIGN/CAD, NATIVE VESSEL/Chronic diastolic CHF (congestive heart failure)  Will hold Plavix in view of  pending intervention otherwise continue home medications Anemia due to chronic blood loss/Protein-calorie malnutrition, severe  Monitor hemoglobin, which is stable at present, and continue to hold antiplatelet/anticoagulant agents.  Supportive care UTI  Levaquin to complete 7 days. Code Status: Full Family Communication: Daughter at the bedside Disposition Plan: Eventually home   Consultants:  GI  Interventional cardiology  Oncology  Procedures:  EGD on 03/03/2015  Antibiotics:  Levaquin 03/03/15>  HPI/Subjective: Some hiccups last night.  Objective: Filed Vitals:   03/05/15 0605  BP: 128/48  Pulse: 68  Temp: 98.2 F (36.8 C)  Resp: 20    Intake/Output Summary (Last 24 hours) at 03/05/15 1246 Last data filed at 03/05/15 0845  Gross per 24 hour  Intake 553.67 ml  Output      0 ml  Net 553.67 ml   Filed Weights   03/01/15 2233 03/04/15 1338 03/05/15 0834  Weight: 103.3 kg (227 lb 11.8 oz) 107.3 kg (236 lb 8.9 oz) 104.781 kg (231 lb)    Exam:   General:  Comfortable at rest.  Cardiovascular: S1-S2 normal. No murmurs. Pulse regular.  Respiratory: Good air entry bilaterally. No rhonchi or rales.  Abdomen: Abdominal fullness and tenderness to palpation right upper quadrant.  Musculoskeletal: No pedal edema   Neurological: Intact  Data Reviewed: Basic Metabolic Panel:  Recent Labs Lab 03/01/15 1500 03/01/15 2219 03/02/15 0507 03/03/15 0535 03/04/15 0507 03/05/15 0515  NA 134*  --  138 137 133* 135  K 3.7  --  3.9 4.4 4.7 4.7  CL 101  --  102 102 100 100  CO2 24  --  26 26 26 25   GLUCOSE 105*  --  96 116* 125* 115*  BUN 24*  --  22 19 15 16   CREATININE 1.26  1.13 1.19 1.04 1.03 1.02  CALCIUM 9.2  --  9.0 9.1 8.8 8.7  MG  --   --  2.0  --   --   --   PHOS  --   --  4.8*  --   --   --    Liver Function Tests:  Recent Labs Lab 03/01/15 1500 03/02/15 0507 03/03/15 0535 03/04/15 0507  AST 25 26 24 22   ALT 37 33 33 32  ALKPHOS 140*  122* 124* 121*  BILITOT 0.8 0.9 0.4 0.5  PROT 7.4 5.9* 6.2 5.9*  ALBUMIN 3.7 3.0* 3.0* 2.8*    Recent Labs Lab 03/01/15 1500  LIPASE 21    Recent Labs Lab 03/04/15 0507  AMMONIA 31   CBC:  Recent Labs Lab 03/01/15 1500 03/01/15 2219 03/02/15 0507 03/03/15 0535 03/04/15 0507 03/05/15 0515  WBC 10.0 12.1* 8.1 9.4 9.7 8.1  NEUTROABS 7.3  --   --  7.2 8.3*  --   HGB 10.5* 10.2* 9.5* 9.5* 9.6* 9.2*  HCT 34.0* 33.0* 30.6* 30.6* 31.3* 29.8*  MCV 87.4 86.8 88.4 87.7 88.9 88.4  PLT 382 376 347 313 305 316   Cardiac Enzymes: No results for input(s): CKTOTAL, CKMB, CKMBINDEX, TROPONINI in the last 168 hours. BNP (last 3 results) No results for input(s): BNP in the last 8760 hours.  ProBNP (last 3 results)  Recent Labs  05/29/14 1910 06/06/14 1244 11/29/14 0944  PROBNP 36.3 83.0 114.6    CBG: No results for input(s): GLUCAP in the last 168 hours.  Recent Results (from the past 240 hour(s))  Culture, Urine     Status: None   Collection Time: 03/02/15  8:40 PM  Result Value Ref Range Status   Specimen Description URINE, CLEAN CATCH  Final   Special Requests NONE  Final   Colony Count NO GROWTH Performed at [facility]   Final   Culture NO GROWTH Performed at [facility]   Final   Report Status 03/04/2015 FINAL  Final     Studies: No results found.  Scheduled Meds: . allopurinol  150 mg Oral Daily  . antiseptic oral rinse  7 mL Mouth Rinse q12n4p  . chlorhexidine  15 mL Mouth Rinse BID  . docusate sodium  100 mg Oral BID  . famotidine  10 mg Oral Daily  . feeding supplement (ENSURE COMPLETE)  237 mL Oral BID BM  . finasteride  5 mg Oral Daily  . furosemide  40 mg Oral Daily  . iron polysaccharides  150 mg Oral BID  . levofloxacin (LEVAQUIN) IV  750 mg Intravenous Daily  . levothyroxine  25 mcg Oral QAC breakfast  . morphine  15 mg Oral Q12H  . pantoprazole  40 mg Oral QHS  . pravastatin  80 mg Oral q1800  . sodium chloride  3 mL  Intravenous Q12H  . spironolactone  25 mg Oral Daily   Continuous Infusions: . sodium chloride 20 mL/hr at 03/03/15 0900     Time spent: 25 minutes    Stevon Gough  [facility] Pager [phone]. If 7PM-7AM, please contact night-coverage at [email or web address], password [facility] 03/05/2015, 12:46 PM  LOS: 4 days

## 2015-03-05 NOTE — Progress Notes (Signed)
Patient ID: James [name], male   DOB: 12-Jul-1928, 81 y.o.   MRN: [id number] Hematology: Pleasant 81 year old man I have followed since 2010 initially for drug-induced ITP. I was asked to reevaluate him again in July 2015 when he developed a sudden unexplained fall in his hemoglobin from his baseline. Please see my office note dated February 29th 2016 for complete details of his evaluation since that time. Briefly, he had recurrent bouts of melena and fall in his hemoglobin over the next few months. To complicate matters further, he had intermittent gross hematuria as well. He was on both antiplatelet agents in view of coronary stent placement in July 2015 and anticoagulants in view of initial left lower extremity DVT in December 2014 and subsequent unexpected findings of portal vein thrombosis discovered in August 2015. No obvious parenchymal lesions in his liver were reported at time of CT scan done 08/11/2014 or MRI of the liver done on 08/20/2014 although the right lobe of the liver appeared abnormal at that time, changes were felt to be consistent with vascular abnormalities related to the recent portal vein thrombosis. I was suspicious that his recurrent thrombotic events, especially in unusual locations, might be secondary to an underlying malignancy but we were unable to find anything at the time of  multiple procedures including upper endoscopy and colonoscopy done on 08/08/2014, recent colonoscopy done on 01/27/2015, capsule endoscopy done on 09/21/2014 and then repeated on 12/29/2014. Most recent colonoscopy showed a small amount of blood in the lumen of the distal ileum which prompted the  follow-up colonoscopy. He underwent cystoscopy with biopsy of a lesion in his bladder on 02/03/2015 for further evaluation of hematuria. Findings were a low-grade papillary urothelial cancer which was noninvasive.  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  He now presents with a 12 day history of right upper quadrant pain. CT scan in the emergency department now shows a number of abnormal findings with an approximate 4 x 4 centimeter mass in the duodenum, bulky portal peripancreatic and celiac axis lymphadenopathy with a large, necrotic appearing lymph node measuring 4 x 3 cm, and an unusual enhancement pattern in the right lobe of his liver previously felt to be vascular now felt more likely to be metastatic involvement with cancer.  He underwent upper endoscopy on March 11 with findings of a friable, fungating mass in the duodenal bulb just distal to the pylorus partially obstructing with presence of retained food. Biopsies were taken and pathology is pending.  CEA tumor marker is normal at 1.4.  On current examination pertinent finding is exquisite tenderness on palpation in the right upper quadrant without any palpable mass or organomegaly.  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  I reviewed the clinical and radiographic data with the patient and his daughter. He is interested in pursuing aggressive treatment. I told him that at his age and with his underlying cardiac condition we have to be careful not to over treat him. He is going to be seen in consultation by Dr. Benay Spice, GI oncology. He may need to involve a multidisciplinary team. Right now bleeding and obstruction are the immediate threat. We may need to consider a local palliative surgical procedure or local radiation to the duodenal area. He will need chemotherapy to be determined by results  of the pathology. If this is a GI primary, given his age, single agent Capecitabine may be the best option. If this turns out to be metastatic melanoma, it may open up other options including immunotherapy. I told the patient and his daughter  that I'm sure that we would be able to come up with a treatment plan to keep the cancer under control but it did not look like we were in a curative situation since the cancer has already spread. I will be available to the patient and his family for any support. At this point I will defer to Dr. Benay Spice for all treatment considerations. Thank you for informing me of the patient's admission.

## 2015-03-05 NOTE — Progress Notes (Signed)
Progress Note   Subjective  More distention today, more nausea and discomfort No vomiting Hiccups bothered him last night and today and hurts Passing gas Has no appetite, though was eating as recently as 2 days ago Very little liquids today Both daughter at bedside along with 7 other family/friends   Objective  Vital signs in last 24 hours: Temp:  [98 F (36.7 C)-98.2 F (36.8 C)] 98.2 F (36.8 C) March 12, 2023 0605) Pulse Rate:  [68-77] 68 2023-03-12 0605) Resp:  [20] 20 Mar 12, 2023 0605) BP: (128-160)/(48-61) 128/48 mmHg March 12, 2023 0605) SpO2:  [95 %] 95 % 03/12/2023 0605) Weight:  [231 lb (104.781 kg)] 231 lb (104.781 kg) 03-12-23 0834) Last BM Date: 03/03/15 Gen: awake, alert, NAD though uncomfortable with movement HEENT: anicteric, op clear CV: RRR, no mrg Pulm: CTA b/l Abd: soft, very tender in the upper abd, more distended today, +BS throughout Ext: no c/c, 1+ LE edema Neuro: nonfocal   Intake/Output from previous day: 03/12 0701 - 2023-03-12 0700 In: 473.7 [P.O.:240; I.V.:233.7] Out: -  Intake/Output this shift: Total I/O In: 320 [P.O.:320] Out: -   Lab Results:  Recent Labs  03/03/15 0535 03/04/15 0507 03-12-2015 0515  WBC 9.4 9.7 8.1  HGB 9.5* 9.6* 9.2*  HCT 30.6* 31.3* 29.8*  PLT 313 305 316   BMET  Recent Labs  03/03/15 0535 03/04/15 0507 2015-03-12 0515  NA 137 133* 135  K 4.4 4.7 4.7  CL 102 100 100  CO2 26 26 25   GLUCOSE 116* 125* 115*  BUN 19 15 16   CREATININE 1.04 1.03 1.02  CALCIUM 9.1 8.8 8.7   LFT  Recent Labs  03/04/15 0507  PROT 5.9*  ALBUMIN 2.8*  AST 22  ALT 32  ALKPHOS 121*  BILITOT 0.5   PT/INR  Recent Labs  03/03/15 0535  LABPROT 14.8  INR 1.15   Studies/Results: Dg Abd 2 Views  03/12/15   CLINICAL DATA:  Abdominal distention and pain for 10 days. History of bowel obstruction and duodenal mass. Porta hepatis adenopathy and liver mass.  EXAM: ABDOMEN - 2 VIEW  COMPARISON:  03/01/2015  FINDINGS: Orally administered contrast  medium is present in the colon. Thoracolumbar spondylosis. No free intraperitoneal gas.  No dilated bowel or abnormal air-fluid levels. Stable mixed sclerotic and lucent lesion of the right acetabular roof, stable.  IMPRESSION: 1. No dilated bowel or significant abnormal air fluid levels are observed to favor current obstruction. Orally administered contrast medium is present in the colon.   Electronically Signed   By: Van Clines M.D.   On: 03-12-15 17:25      Assessment & Plan  81 yo male with hx of PVT with portal hypertension, occult GI bleeding, CAD, CHF, DVT, IDA, bradycardia, HL, arthritis admitted with RUQ pain and CT showing duodenal mass with concern for hepatic metastatic disease  1. Duodenal mass with partial obstruction/liver mets -- waiting on pathology.  He is more uncomfortable today which is consistent with worsening duodenual obstruction.  Xray does not show air fluid levels.  He may require an NGT to decompress the stomach.  This process seems to be very aggressive and moving quickly (also evidence by the fact it was not seen at [facility] done earlier this year).  --Await pathology results --seen by Dr. Beryle Beams today, will see Dr. Benay Spice once path results available --Liquid diet only, and NPO if worsening pain or vomiting.  May need NGT (will leave order in case needed tonight) --Increase IVFs to avoid vol depletion --  may need TPN --may need diverting gastro-j vs. Oncology treatment (chemo/xrt) to shrink tumor.  Unsure is stent possible as we would need to be able to traverse the lesion --continue to ambulate as much as possible --discuss with pt/family/friends --Hemoglobin is stable today without evidence for bleeding. --Continue to hold antiplatelet and anticoagulants    Active Problems:   HYPERTENSION, BENIGN   CAD, NATIVE VESSEL   Chronic diastolic CHF (congestive heart failure)   Portal vein thrombosis   Anemia due to chronic blood loss   Duodenal mass    Abdominal pain   Hepatic metastases   Liver mass   Cancer related pain   CHF (congestive heart failure)   Protein-calorie malnutrition, severe   Hypothyroidism   UTI (urinary tract infection)   Duodenal obstruction, acquired   History of DVT of lower extremity     LOS: 4 days   Justn Quale M  03/05/2015, 5:31 PM

## 2015-03-06 ENCOUNTER — Encounter (HOSPITAL_COMMUNITY): Payer: Self-pay | Admitting: Internal Medicine

## 2015-03-06 DIAGNOSIS — K922 Gastrointestinal hemorrhage, unspecified: Secondary | ICD-10-CM

## 2015-03-06 DIAGNOSIS — Z862 Personal history of diseases of the blood and blood-forming organs and certain disorders involving the immune mechanism: Secondary | ICD-10-CM

## 2015-03-06 DIAGNOSIS — R1909 Other intra-abdominal and pelvic swelling, mass and lump: Secondary | ICD-10-CM

## 2015-03-06 DIAGNOSIS — R109 Unspecified abdominal pain: Secondary | ICD-10-CM

## 2015-03-06 DIAGNOSIS — R591 Generalized enlarged lymph nodes: Secondary | ICD-10-CM

## 2015-03-06 DIAGNOSIS — Z86718 Personal history of other venous thrombosis and embolism: Secondary | ICD-10-CM

## 2015-03-06 LAB — CBC
HEMATOCRIT: 28.9 % — AB (ref 39.0–52.0)
Hemoglobin: 9 g/dL — ABNORMAL LOW (ref 13.0–17.0)
MCH: 27.8 pg (ref 26.0–34.0)
MCHC: 31.1 g/dL (ref 30.0–36.0)
MCV: 89.2 fL (ref 78.0–100.0)
Platelets: 281 10*3/uL (ref 150–400)
RBC: 3.24 MIL/uL — AB (ref 4.22–5.81)
RDW: 20.4 % — ABNORMAL HIGH (ref 11.5–15.5)
WBC: 7.4 10*3/uL (ref 4.0–10.5)

## 2015-03-06 LAB — BASIC METABOLIC PANEL
Anion gap: 7 (ref 5–15)
BUN: 18 mg/dL (ref 6–23)
CALCIUM: 8.9 mg/dL (ref 8.4–10.5)
CO2: 28 mmol/L (ref 19–32)
Chloride: 101 mmol/L (ref 96–112)
Creatinine, Ser: 0.99 mg/dL (ref 0.50–1.35)
GFR, EST AFRICAN AMERICAN: 85 mL/min — AB (ref >90)
GFR, EST NON AFRICAN AMERICAN: 73 mL/min — AB (ref >90)
GLUCOSE: 107 mg/dL — AB (ref 70–99)
Potassium: 4.6 mmol/L (ref 3.5–5.1)
SODIUM: 136 mmol/L (ref 135–145)

## 2015-03-06 NOTE — Progress Notes (Signed)
[facility] Progress Note  Subjective:  Feels slightly better today compared to yesterday.  Still has pain, but is using pain meds now so it is better.  Has a lot of belching, but hiccups have been better.  He is passing flatus.  Is on liquid diet and tolerating minimal amounts.  Objective:  Vital signs in last 24 hours: Temp:  [97.6 F (36.4 C)-98 F (36.7 C)] 97.6 F (36.4 C) (03/14 0535) Pulse Rate:  [57-66] 57 (03/14 0535) Resp:  [20] 20 (03/14 0535) BP: (90-130)/(41-54) 90/41 mmHg (03/14 0535) SpO2:  [92 %-97 %] 97 % (03/14 0535) Last BM Date: 03/03/15 General:  Alert, Well-developed, in NAD Heart:  Regular rate and rhythm; no murmurs Pulm: Abdomen:  Soft, nontender and nondistended. Normal bowel sounds, without guarding, and without rebound.   Extremities:  Without edema. Neurologic:  Alert and  oriented x4;  grossly normal neurologically. Psych:  Alert and cooperative. Normal mood and affect.  Intake/Output from previous day: 03/13 0701 - 03/14 0700 In: 320 [P.O.:320] Out: -   Lab Results:  Recent Labs  03/04/15 0507 03/05/15 0515 03/06/15 0440  WBC 9.7 8.1 7.4  HGB 9.6* 9.2* 9.0*  HCT 31.3* 29.8* 28.9*  PLT 305 316 281   BMET  Recent Labs  03/04/15 0507 03/05/15 0515 03/06/15 0440  NA 133* 135 136  K 4.7 4.7 4.6  CL 100 100 101  CO2 26 25 28   GLUCOSE 125* 115* 107*  BUN 15 16 18   CREATININE 1.03 1.02 0.99  CALCIUM 8.8 8.7 8.9   LFT  Recent Labs  03/04/15 0507  PROT 5.9*  ALBUMIN 2.8*  AST 22  ALT 32  ALKPHOS 121*  BILITOT 0.5   Dg Abd 2 Views  03/05/2015   CLINICAL DATA:  Abdominal distention and pain for 10 days. History of bowel obstruction and duodenal mass. Porta hepatis adenopathy and liver mass.  EXAM: ABDOMEN - 2 VIEW  COMPARISON:  03/01/2015  FINDINGS: Orally administered contrast medium is present in the colon. Thoracolumbar spondylosis. No free intraperitoneal gas.  No dilated bowel or abnormal air-fluid  levels. Stable mixed sclerotic and lucent lesion of the right acetabular roof, stable.  IMPRESSION: 1. No dilated bowel or significant abnormal air fluid levels are observed to favor current obstruction. Orally administered contrast medium is present in the colon.   Electronically Signed   By: Van Clines M.D.   On: 03/05/2015 17:25    Assessment / Plan: 81 yo male with hx of PVT with portal hypertension, occult GI bleeding, CAD, CHF, DVT, IDA, bradycardia, HL, arthritis admitted with RUQ pain and CT showing duodenal mass with concern for hepatic metastatic disease  1.  Duodenal mass with partial obstruction/liver mets -- waiting on pathology.  He is slightly more comfortable than yesterday.  Xray did not show air fluid levels; he has not required NGT at this point.  This process seems to be very aggressive and moving quickly (also evidenced by the fact it was not seen at [facility] done earlier this year).   --Await pathology results (spoke with pathology department; staff reported that the case will be reviewed by Dr. Saralyn Pilar and was likely given to him today). --seen by Dr. Beryle Beams over the weekend; Dr. Benay Spice saw him this morning. --Liquid diet only, and NPO if worsening pain or vomiting.  May need NGT if symptoms worsen. --may need TPN --may need diverting gastro-j vs.Oncology treatment (chemo/xrt) to shrink tumor.  We do not think that  this is amenable to endoscopic stent.  Will consult surgery. --Continue to ambulate as much as possible --Hemoglobin is fairly stable without evidence for bleeding. --Continue to hold antiplatelet and anticoagulants    LOS: 5 days   ZEHR, JESSICA D.  03/06/2015, 8:53 AM  Pager number [phone]  GI ATTENDING  Case reviewed in GI morning report with colleagues. Interval history and data reviewed. Patient personally seen and examined (daughter in room). Has right upper quadrant pain. Tolerating limited clears without vomiting. Pathology pending. Given  the size, high-grade nature, and configuration of the tumor stenting not felt to the technically feasible with increased risk of perforation. As such, recommending surgical gastrojejunostomy to bypass duodenal obstruction. Discussed with patient and daughter. Will consult surgery.  Docia Chuck. Geri Seminole., M.D. [facility] of Gastroenterology

## 2015-03-06 NOTE — Progress Notes (Signed)
[facility] PROGRESS NOTE  James [name] EGB:[id number] DOB: 1928-03-07 [name]: 03/01/2015 PCP: [name], MD  Summary Appreciate GI/IR/Oncology/Surgery. James [name] is a pleasant 81 yo male with history of coronary artery disease, diastolic heart failure, chronic anemia due to chronic blood loss, portal vein thrombosis, urothelial bladder CA(biopsy 02/06/15), who presented with excruciating right low quadrant pain and was found to have new fungating duodenal mass as well as large hepatic masses. Patient was on Plavix for history of drug eluded stent placed in July, but this was held at the time of admission in anticipation of surgical procedures. GI perfomed EGD with biopsy on 03/03/15. Incidentally, patient was found to have hypothyroidism(tsh 12.2). Free T4 is normal. Patient was started on low dose Synthroid. He was also found to have UTI. He seemed to have some confusion-?due to UTI but this has cleared up today ?if recurrence, worry about brain mets. Urine cultures negative. Will continue Levaquin and await duodenal mass biopsy results. Keep holding Plavix as patient may need more invasive procedures. Will defer to GI/Oncology/IR/Surgery regarding further work up. Patient has tolerated food better overnight. However, there is concern that he may retaining food in the stomach because of duodenal mass. Agree with slowing down feeding depending on patient's tolerance. I encouraged him to take small meals and ambulate.  Plan Duodenal mass/Hepatic metastases/Liver mass/Abdominal pain/Cancer related pain/Portal vein thrombosis  Follow pathology  Defer management to GI/Dr. Benay Spice  Consider CT brain if evidence of confusion to exclude brain metastases.  Diet as tolerated. Ambulate. Hypothyroidism  Continue Synthroid  Needs follow up TFTs in 6 weeks. HYPERTENSION, BENIGN/CAD, NATIVE VESSEL/Chronic diastolic CHF (congestive heart failure)  Continue to hold Plavix in view of pending  intervention otherwise continue home medications Anemia due to chronic blood loss/Protein-calorie malnutrition, severe  Monitor hemoglobin, which is stable at present, and continue to hold antiplatelet/anticoagulant agents.  Supportive care UTI  Levaquin to complete 7 days. Code Status: Full Family Communication: Daughter at the bedside Disposition Plan: Eventually home   Consultants:  GI  Interventional cardiology  Oncology  General surgery  Procedures:  EGD on 03/03/2015  Antibiotics:  Levaquin 03/03/15>  HPI/Subjective: Less abdominal pain  Objective: Filed Vitals:   03/06/15 1413  BP: 130/52  Pulse: 60  Temp: 97.5 F (36.4 C)  Resp: 20    Intake/Output Summary (Last 24 hours) at 03/06/15 2021 Last data filed at 03/06/15 1826  Gross per 24 hour  Intake   1260 ml  Output      0 ml  Net   1260 ml   Filed Weights   03/01/15 2233 03/04/15 1338 03/05/15 0834  Weight: 103.3 kg (227 lb 11.8 oz) 107.3 kg (236 lb 8.9 oz) 104.781 kg (231 lb)    Exam:   General:  Comfortable at rest.  Cardiovascular: S1-S2 normal. No murmurs. Pulse regular.  Respiratory: Good air entry bilaterally. No rhonchi or rales.  Abdomen: Soft and tender to palpation. Normal bowel sounds. No organomegaly.  Musculoskeletal: No pedal edema   Neurological: Intact  Data Reviewed: Basic Metabolic Panel:  Recent Labs Lab 03/02/15 0507 03/03/15 0535 03/04/15 0507 03/05/15 0515 03/06/15 0440  NA 138 137 133* 135 136  K 3.9 4.4 4.7 4.7 4.6  CL 102 102 100 100 101  CO2 26 26 26 25 28   GLUCOSE 96 116* 125* 115* 107*  BUN 22 19 15 16 18   CREATININE 1.19 1.04 1.03 1.02 0.99  CALCIUM 9.0 9.1 8.8 8.7 8.9  MG 2.0  --   --   --   --  PHOS 4.8*  --   --   --   --    Liver Function Tests:  Recent Labs Lab 03/01/15 1500 03/02/15 0507 03/03/15 0535 03/04/15 0507  AST 25 26 24 22   ALT 37 33 33 32  ALKPHOS 140* 122* 124* 121*  BILITOT 0.8 0.9 0.4 0.5  PROT 7.4 5.9* 6.2  5.9*  ALBUMIN 3.7 3.0* 3.0* 2.8*    Recent Labs Lab 03/01/15 1500  LIPASE 21    Recent Labs Lab 03/04/15 0507  AMMONIA 31   CBC:  Recent Labs Lab 03/01/15 1500  03/02/15 0507 03/03/15 0535 03/04/15 0507 03/05/15 0515 03/06/15 0440  WBC 10.0  < > 8.1 9.4 9.7 8.1 7.4  NEUTROABS 7.3  --   --  7.2 8.3*  --   --   HGB 10.5*  < > 9.5* 9.5* 9.6* 9.2* 9.0*  HCT 34.0*  < > 30.6* 30.6* 31.3* 29.8* 28.9*  MCV 87.4  < > 88.4 87.7 88.9 88.4 89.2  PLT 382  < > 347 313 305 316 281  < > = values in this interval not displayed. Cardiac Enzymes: No results for input(s): CKTOTAL, CKMB, CKMBINDEX, TROPONINI in the last 168 hours. BNP (last 3 results) No results for input(s): BNP in the last 8760 hours.  ProBNP (last 3 results)  Recent Labs  05/29/14 1910 06/06/14 1244 11/29/14 0944  PROBNP 36.3 83.0 114.6    CBG: No results for input(s): GLUCAP in the last 168 hours.  Recent Results (from the past 240 hour(s))  Culture, Urine     Status: None   Collection Time: 03/02/15  8:40 PM  Result Value Ref Range Status   Specimen Description URINE, CLEAN CATCH  Final   Special Requests NONE  Final   Colony Count NO GROWTH Performed at [facility]   Final   Culture NO GROWTH Performed at [facility]   Final   Report Status 03/04/2015 FINAL  Final     Studies: Dg Abd 2 Views  03/05/2015   CLINICAL DATA:  Abdominal distention and pain for 10 days. History of bowel obstruction and duodenal mass. Porta hepatis adenopathy and liver mass.  EXAM: ABDOMEN - 2 VIEW  COMPARISON:  03/01/2015  FINDINGS: Orally administered contrast medium is present in the colon. Thoracolumbar spondylosis. No free intraperitoneal gas.  No dilated bowel or abnormal air-fluid levels. Stable mixed sclerotic and lucent lesion of the right acetabular roof, stable.  IMPRESSION: 1. No dilated bowel or significant abnormal air fluid levels are observed to favor current obstruction. Orally  administered contrast medium is present in the colon.   Electronically Signed   By: Van Clines M.D.   On: 03/05/2015 17:25    Scheduled Meds: . allopurinol  150 mg Oral Daily  . antiseptic oral rinse  7 mL Mouth Rinse q12n4p  . chlorhexidine  15 mL Mouth Rinse BID  . feeding supplement (ENSURE COMPLETE)  237 mL Oral BID BM  . finasteride  5 mg Oral Daily  . iron polysaccharides  150 mg Oral BID  . levofloxacin (LEVAQUIN) IV  750 mg Intravenous Daily  . levothyroxine  25 mcg Oral QAC breakfast  . morphine  15 mg Oral Q12H  . ondansetron  4 mg Intravenous Q6H  . pantoprazole (PROTONIX) IV  40 mg Intravenous Q24H  . pravastatin  80 mg Oral q1800   Continuous Infusions: . dextrose 5 % and 0.45 % NaCl with KCl 20 mEq/L 75 mL/hr at 03/05/15 1841  Time spent: 15 minutes    Tenesha Garza  [facility] Pager [phone]. If 7PM-7AM, please contact night-coverage at [email or web address], password [facility] 03/06/2015, 8:21 PM  LOS: 5 days

## 2015-03-06 NOTE — Consult Note (Signed)
Reason for Consult: Duodenal mass and gastric outlet obstruction  Referring Physician: Afnan Emberton is an 81 y.o. male.  HPI: We are asked to see this patient who is admitted for days ago due to progressive abdominal pain and intolerance of diet. He has a significant history of initially DVT and subsequently portal vein thrombosis occurring in July and August of last year. The patient was also found to be anemic and had evidence of GI blood loss. He underwent an extensive workup at that time including upper and lower endoscopy without significant finding. Capsule endoscopy at that time indicated some bleeding from the terminal ileum not confirmed on colonoscopy. He was found to have a superficial bladder tumor is well at that time treated nonsurgically. He was initially on Coumadin but then changed later to aspirin and Plavix due to anemia and suspicion of GI blood loss. He was also found to have esophageal varices, splenomegaly and atypical portal venous circulation. For the last several weeks the patient has had progressive abdominal pain in his right upper quadrant and right flank. This has been constant and worse with motion. Over the same period of time he has complained of upper abdominal bloating, gas, belching and nausea and inability to eat. He has lost 50 pounds since last fall. He was admitted and aspirin and Plavix discontinued. CT scan as below which I have personally reviewed shows a large mass in the first portion of the duodenum with partial obstruction with an apparent large necrotic lymph node adjacent to the duodenum and an apparent liver metastasis. Upper endoscopy which I reviewed showed a large friable fungating mass partially obstructing in the proximal duodenum. The patient currently is feeling better with pain control in the hospital. He has been unable to tolerate solid food due to bloating and gas and discomfort and has been restricted to clear liquids. Pathology from his  duodenal biopsy is pending. Significant previous abdominal surgery includes only open appendectomy and inguinal hernia repairs.  Past Medical History  Diagnosis Date  . Bradycardia     Hypertensive  . carotid bruit, right   . hypercholesterolemia     Trig 234/293;takes Simcor daily  . Obesity, morbid   . Vasculitis   . 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 Beryle Beams; Dr. Beryle Beams , normal platelets  . Dermatitis     legs  . Bleeding gums   . Chronic low back pain   . Myalgia and myositis   . Unspecified vitamin D deficiency   . Bladder diverticulum   . Primary osteoarthritis of both knees   . Nephrolithiasis   . History of elevated glucose   . Thrombophlebitis     right forearm  . History of BPH   . History of colonic polyps     [name]  . Epididymitis, left     S/P Epidimectomy  . Splenomegaly 05/15/09    abd U/S NML  . Hypertension, benign     takes Micardis daily  . Neuropathy     acute  . Bruises easily   . H/O hiatal hernia   . GERD (gastroesophageal reflux disease)     hx of but doesn't take any meds now  . Urinary frequency   . Urinary urgency   . Nocturia   . Urinary leakage   . Abnormality of gait 03/19/2013  . DVT of lower limb, acute 11/19/2013    Peroneal & distal tibial left 11/01/13  . Polio 1941    "  mild case"  . CHF (congestive heart failure)     "low grade" (06/22/2014)  . DVT (deep venous thrombosis) 10/2013    LLE  . Pneumonia, bacterial     RML resolved, spirometry, normal  . Pneumonia 1933  . History of blood transfusion 05/2014  . Anemia 05/2014    "related to Xarelto"  . Iron deficiency anemia 05/2014    "got iron infusion"  . Arthritis     "all over"  . Gout   . Malignant melanoma of ear     right  . Thrombophlebitis of superficial veins of right lower extremity 06/23/2014    Incidental finding doppler 05/30/14  . Shortness of breath   . Esophageal varices without mention of bleeding  08/08/2014  . Portal vein thrombosis     Past Surgical History  Procedure Laterality Date  . Cystoscopy w/ stone manipulation  1950's  . Fem cutaneous nerve entrapment  1977    due to surgery (mild lat anesth of bilat legs)  . Limited pelvis/hip bone scan  04/99    negative  . Bone scan  04/00  . Cataract extraction w/ intraocular lens  implant, bilateral Bilateral ~ 2004  . Melanoma excision Right 06/04    ear; Wide local excision,,with flap  . Stress cardiolite  05/11/04    mild inf. ischemia, EF 71%  . Shoulder surgery Left 06/17/2005    "tore it all to pieces when I fell; not fractured"  . Bronchoscopy  09/08    RML collapse with chronic pneumonia, bx neg (Dr. Joya Gaskins)  . Cyst excision Right 03/07/09    middle finger, DIP Joint Mucoid (Dr. Fredna Dow)  . Eyelid & eyebrow lift  1996  . Cyst excision Right 08/15/09    middle finger, DIP Joint Mucoid Cyst (Dr. Fredna Dow)  . Appendectomy  1950  . Excision of mucoid tumor    . Colonoscopy    . Total knee arthroplasty  01/10/2012    Procedure: TOTAL KNEE ARTHROPLASTY;  Surgeon: Augustin Schooling, MD;  Location: [place];  Service: Orthopedics;  Laterality: Left;  . Surgery scrotal / testicular Left ~ 2000    removal of mass, noncancerous  . Cardiac catheterization  05/23/04    mild plague-statin  . Coronary angioplasty with stent placement  06/22/2014    to proximal LAD  . Inguinal hernia repair Bilateral 1959  . Inguinal hernia repair Left 1970's  . Esophagogastroduodenoscopy N/A 08/08/2014    Procedure: ESOPHAGOGASTRODUODENOSCOPY (EGD);  Surgeon: Gatha Mayer, MD;  Location: [facility] ENDOSCOPY;  Service: Endoscopy;  Laterality: N/A;  . Colonoscopy N/A 08/08/2014    Procedure: COLONOSCOPY;  Surgeon: Gatha Mayer, MD;  Location: [place];  Service: Endoscopy;  Laterality: N/A;  . Flexible sigmoidoscopy N/A 09/21/2014    Procedure: FLEXIBLE SIGMOIDOSCOPY;  Surgeon: Jerene Bears, MD;  Location: [place];  Service: Gastroenterology;   Laterality: N/A;  . Givens capsule study N/A 09/21/2014    Procedure: GIVENS CAPSULE STUDY;  Surgeon: Jerene Bears, MD;  Location: [facility] ENDOSCOPY;  Service: Endoscopy;  Laterality: N/A;  . Left and right heart catheterization with coronary angiogram N/A 06/22/2014    Procedure: LEFT AND RIGHT HEART CATHETERIZATION WITH CORONARY ANGIOGRAM;  Surgeon: Larey Dresser, MD;  Location: [facility] CATH LAB;  Service: Cardiovascular;  Laterality: N/A;  . Percutaneous coronary stent intervention (pci-s)  06/22/2014    Procedure: PERCUTANEOUS CORONARY STENT INTERVENTION (PCI-S);  Surgeon: Larey Dresser, MD;  Location: [facility] CATH LAB;  Service: Cardiovascular;;  .  Colonoscopy N/A 01/25/2015    Procedure: COLONOSCOPY;  Surgeon: Gatha Mayer, MD;  Location: [facility] ENDOSCOPY;  Service: Endoscopy;  Laterality: N/A;  . Cystoscopy with biopsy Right 02/03/2015    Procedure: CYSTOSCOPY WITH COLD CUP BLADDER BIOPSY CAUTHERIZAITON OF RIGHT BLADDER CANCER AND SATILITE, TURBT RIGHT BLADDER BASE;  Surgeon: Ailene Rud, MD;  Location: [facility] ORS;  Service: Urology;  Laterality: Right;  . Esophagogastroduodenoscopy N/A 03/03/2015    Procedure: ESOPHAGOGASTRODUODENOSCOPY (EGD);  Surgeon: Jerene Bears, MD;  Location: [facility] ENDOS[facility];  Service: Gastroenterology;  Laterality: N/A;    Family History  Problem Relation Age of Onset  . Thyroid cancer Mother   . Diabetes Father   . Heart disease Father   . Stroke Brother     cerebral hemm  . Colon cancer Brother   . Anesthesia problems Neg Hx   . Hypotension Neg Hx   . Malignant hyperthermia Neg Hx   . Pseudochol deficiency Neg Hx   . Prostate cancer Neg Hx   . Heart attack Father     Social History:  reports that he quit smoking about 47 years ago. His smoking use included Cigarettes and Cigars. He has a 40 pack-year smoking history. He has never used smokeless tobacco. He reports that he does not drink alcohol or use illicit drugs.  Allergies:  Allergies  Allergen Reactions  . Sulfa  Antibiotics Other (See Comments)    Caused patient have ITP  . Aleve [Naproxen Sodium] Other (See Comments)    Causes platlet drop  . Rofecoxib Other (See Comments)    Celebrex  - affected low platelets  . Sulfamethoxazole-Trimethoprim Other (See Comments)     Causes low platelets and bleeding  . Tape Other (See Comments)    Adhesive tape makes skin tear.  . Tramadol Hcl Other (See Comments)    dizziness, drowsiness  . Neomycin-Bacitracin Zn-Polymyx Rash  . Neosporin [Neomycin-Polymyxin-Gramicidin] Rash  . Penicillins Swelling and Rash    Took dose of amoxicillin 2028m 02/02/15 at dentist office without any complications   Current Facility-Administered Medications  Medication Dose Route Frequency Provider Last Rate Last Dose  . acetaminophen (TYLENOL) tablet 650 mg  650 mg Oral Q6H PRN AToy Baker MD       Or  . acetaminophen (TYLENOL) suppository 650 mg  650 mg Rectal Q6H PRN AToy Baker MD      . allopurinol (ZYLOPRIM) tablet 150 mg  150 mg Oral Daily AToy Baker MD   150 mg at 03/05/15 0951  . antiseptic oral rinse (CPC / CETYLPYRIDINIUM CHLORIDE 0.05%) solution 7 mL  7 mL Mouth Rinse q12n4p AToy Baker MD   7 mL at 03/05/15 1600  . chlorhexidine (PERIDEX) 0.12 % solution 15 mL  15 mL Mouth Rinse BID AToy Baker MD   15 mL at 03/06/15 0758  . dextrose 5 % and 0.45 % NaCl with KCl 20 mEq/L infusion   Intravenous Continuous JJerene Bears MD 75 mL/hr at 03/05/15 1841    . feeding supplement (ENSURE COMPLETE) (ENSURE COMPLETE) liquid 237 mL  237 mL Oral BID BM Anastassia Doutova, MD   237 mL at 03/05/15 1000  . finasteride (PROSCAR) tablet 5 mg  5 mg Oral Daily AToy Baker MD   5 mg at 03/06/15 1000  . HYDROmorphone (DILAUDID) injection 1 mg  1 mg Intravenous Q2H PRN AToy Baker MD   1 mg at 03/06/15 1144  . iron polysaccharides (NIFEREX) capsule 150 mg  150 mg Oral BID AToy Baker MD  150 mg at 03/06/15 1000  .  levofloxacin (LEVAQUIN) IVPB 750 mg  750 mg Intravenous Daily Simbiso Ranga, MD   750 mg at 03/06/15 1000  . levothyroxine (SYNTHROID, LEVOTHROID) tablet 25 mcg  25 mcg Oral QAC breakfast Simbiso Ranga, MD   25 mcg at 03/06/15 0757  . morphine (MS CONTIN) 12 hr tablet 15 mg  15 mg Oral Q12H Simbiso Ranga, MD   15 mg at 03/05/15 1802  . ondansetron (ZOFRAN) injection 4 mg  4 mg Intravenous Q6H Jerene Bears, MD   4 mg at 03/06/15 0756  . oxyCODONE (Oxy IR/ROXICODONE) immediate release tablet 5-10 mg  5-10 mg Oral Q4H PRN Toy Baker, MD   5 mg at 03/05/15 2059  . pantoprazole (PROTONIX) injection 40 mg  40 mg Intravenous Q24H Jerene Bears, MD   40 mg at 03/05/15 2106  . pravastatin (PRAVACHOL) tablet 80 mg  80 mg Oral q1800 Toy Baker, MD   80 mg at 03/05/15 1802    CMP Latest Ref Rng 03/06/2015 03/05/2015 03/04/2015  Glucose 70 - 99 mg/dL 107(H) 115(H) 125(H)  BUN 6 - 23 mg/dL _0 Creatinine 0.50 - 1.35 mg/dL 0.99 1.02 1.03  Sodium 135 - 145 mmol/L 136 135 133(L)  Potassium 3.5 - 5.1 mmol/L 4.6 4.7 4.7  Chloride 96 - 112 mmol/L 101 100 100  CO2 19 - 32 mmol/L _1 Calcium 8.4 - 10.5 mg/dL 8.9 8.7 8.8  Total Protein 6.0 - 8.3 g/dL - - 5.9(L)  Total Bilirubin 0.3 - 1.2 mg/dL - - 0.5  Alkaline Phos 39 - 117 U/L - - 121(H)  AST 0 - 37 U/L - - 22  ALT 0 - 53 U/L - - 32      Results for orders placed or performed during the hospital encounter of 03/01/15 (from the past 48 hour(s))  CBC     Status: Abnormal   Collection Time: 03/05/15  5:15 AM  Result Value Ref Range   WBC 8.1 4.0 - 10.5 K/uL   RBC 3.37 (L) 4.22 - 5.81 MIL/uL   Hemoglobin 9.2 (L) 13.0 - 17.0 g/dL   HCT 29.8 (L) 39.0 - 52.0 %   MCV 88.4 78.0 - 100.0 fL   MCH 27.3 26.0 - 34.0 pg   MCHC 30.9 30.0 - 36.0 g/dL   RDW 20.5 (H) 11.5 - 15.5 %   Platelets 316 150 - 400 K/uL  Basic metabolic panel     Status: Abnormal   Collection Time: 03/05/15  5:15 AM  Result Value Ref Range   Sodium 135 135 - 145  mmol/L   Potassium 4.7 3.5 - 5.1 mmol/L   Chloride 100 96 - 112 mmol/L   CO2 25 19 - 32 mmol/L   Glucose, Bld 115 (H) 70 - 99 mg/dL   BUN 16 6 - 23 mg/dL   Creatinine, Ser 1.02 0.50 - 1.35 mg/dL   Calcium 8.7 8.4 - 10.5 mg/dL   GFR calc non Af Amer 65 (L) >90 mL/min   GFR calc Af Amer 76 (L) >90 mL/min    Comment: (NOTE) The eGFR has been calculated using the CKD EPI equation. This calculation has not been validated in all clinical situations. eGFR's persistently <90 mL/min signify possible Chronic Kidney Disease.    Anion gap 10 5 - 15  CBC     Status: Abnormal   Collection Time: 03/06/15  4:40 AM  Result Value Ref Range   WBC 7.4  4.0 - 10.5 K/uL   RBC 3.24 (L) 4.22 - 5.81 MIL/uL   Hemoglobin 9.0 (L) 13.0 - 17.0 g/dL   HCT 28.9 (L) 39.0 - 52.0 %   MCV 89.2 78.0 - 100.0 fL   MCH 27.8 26.0 - 34.0 pg   MCHC 31.1 30.0 - 36.0 g/dL   RDW 20.4 (H) 11.5 - 15.5 %   Platelets 281 150 - 400 K/uL  Basic metabolic panel     Status: Abnormal   Collection Time: 03/06/15  4:40 AM  Result Value Ref Range   Sodium 136 135 - 145 mmol/L   Potassium 4.6 3.5 - 5.1 mmol/L   Chloride 101 96 - 112 mmol/L   CO2 28 19 - 32 mmol/L   Glucose, Bld 107 (H) 70 - 99 mg/dL   BUN 18 6 - 23 mg/dL   Creatinine, Ser 0.99 0.50 - 1.35 mg/dL   Calcium 8.9 8.4 - 10.5 mg/dL   GFR calc non Af Amer 73 (L) >90 mL/min   GFR calc Af Amer 85 (L) >90 mL/min    Comment: (NOTE) The eGFR has been calculated using the CKD EPI equation. This calculation has not been validated in all clinical situations. eGFR's persistently <90 mL/min signify possible Chronic Kidney Disease.    Anion gap 7 5 - 15  Dg Abd 2 Views  03/05/2015   CLINICAL DATA:  Abdominal distention and pain for 10 days. History of bowel obstruction and duodenal mass. Porta hepatis adenopathy and liver mass.  EXAM: ABDOMEN - 2 VIEW  COMPARISON:  03/01/2015  FINDINGS: Orally administered contrast medium is present in the colon. Thoracolumbar spondylosis. No  free intraperitoneal gas.  No dilated bowel or abnormal air-fluid levels. Stable mixed sclerotic and lucent lesion of the right acetabular roof, stable.  IMPRESSION: 1. No dilated bowel or significant abnormal air fluid levels are observed to favor current obstruction. Orally administered contrast medium is present in the colon.   Electronically Signed   By: Van Clines M.D.   On: 03/05/2015 17:25    Review of Systems  Constitutional: Positive for weight loss and malaise/fatigue.  Respiratory: Negative.   Cardiovascular: Negative.   Gastrointestinal: Positive for nausea and abdominal pain. Negative for vomiting, diarrhea and constipation.  Genitourinary: Negative.   Psychiatric/Behavioral: Positive for memory loss.   Blood pressure 90/41, pulse 57, temperature 97.6 F (36.4 C), temperature source Oral, resp. rate 20, height 5' 11.5" (1.816 m), weight 231 lb (104.781 kg), SpO2 97 %. Physical Exam General: Alert, Pleasant obese elderly Caucasian male, in no distress Skin: Warm and dry without rash or infection. HEENT: No palpable masses or thyromegaly. Sclera nonicteric. Pupils equal round and reactive. Oropharynx clear. Postsurgical deformity right ear Lymph nodes: No cervical, supraclavicular, or inguinal nodes palpable. Lungs: Breath sounds clear and equal without increased work of breathing Cardiovascular: Regular rate and rhythm without murmur. No JVD or edema. Peripheral pulses intact. Abdomen: Mildly distended. Mild to moderate tenderness in the right upper quadrant. Well-healed incisions.. No masses palpable. No organomegaly. No palpable hernias. Extremities: No edema or joint swelling or deformity. No chronic venous stasis changes. Neurologic: Alert and fully oriented. No gross motor deficits  Assessment/Plan: 81 year old male with a number of chronic medical problems and history of DVT and peripheral vein thrombosis 2015. Now presents with a large partially obstructing mass in  the proximal duodenum with evidence of regional adenopathy and apparent hepatic metastasis. Pathology is pending. He has a history of melanoma remotely that would seem unlikely to  be the source. Possible duodenal primary. Regardless of the source this does not appear to be surgically resectable.He does appear to have significant gastric outlet obstruction and I believe would be a candidate for bypass with gastrojejunostomy that could be done laparoscopically. He is not felt to be a candidate for stenting. This was all discussed with the patient and his daughter. I'm going to await the final pathology although I doubt this will change the surgical plan. I would like him off his Plavix for about 1 week. Potentially could look at surgical intervention in about 2 days.  Caterra Ostroff T 03/06/2015, 12:27 PM

## 2015-03-06 NOTE — Progress Notes (Signed)
New Hematology/Oncology Consult   Referral MD:Jay Pyrtle        Reason for Referral: Duodenal mass    HPI: [name] has a complex medical history including a portal vein thrombosis diagnosed in September 2015. 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 He was admitted on 03/01/2015 with severe abdominal pain. A CT of the abdomen and pelvis on 03/01/2015 revealed a duodenal mass. A bizarre enhancement pattern was noted in the liver due to portal vein thrombosis. Right hepatic lobe lesion seen on a previous study have coalesced into a large mass occupying the inferior right liver. Bulky portal, peripancreatic, and celiac axis lymphadenopathy was noted. There is also left-sided retroperitoneal lymphadenopathy. A left iliopsoas mass was noted.  He was taken to an upper endoscopy 03/03/2015 by Dr.Pyrtle. Small varices were noted in the lower esophagus. A large amount of residual food in the stomach. A fungating mass was found in the duodenal bulb immediately pass the pylorus. The scope could not be advanced into the second portion of the duodenum. Multiple biopsies were performed.  Preliminary review of the pathology by Dr. Saralyn Pilar reveals a high-grade malignancy. Immunohistochemical stains are pending.  [name] continues to have pain in the right abdomen.     Past Medical History  Diagnosis Date  . Bradycardia     Hypertensive  . carotid bruit, right   . hypercholesterolemia     Trig 234/293;takes Simcor daily  . Obesity, morbid   . Vasculitis   . 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 Beryle Beams; Dr. Beryle Beams , normal platelets  . Dermatitis     legs  . Bleeding gums   . Chronic low back pain   . Myalgia and myositis   . Unspecified vitamin D deficiency   . Bladder diverticulum   . Primary osteoarthritis of both knees   . Nephrolithiasis   . History of elevated glucose   . Thrombophlebitis     right  forearm  . History of BPH   . History of colonic polyps     [name]  . Epididymitis, left     S/P Epidimectomy  . Splenomegaly 05/15/09    abd U/S NML  . Hypertension, benign     takes Micardis daily  . Neuropathy     acute  .  coronary artery disease-status post a drug-eluting stent in the LAD   July 2015   . H/O hiatal hernia   . GERD (gastroesophageal reflux disease)     hx of but doesn't take any meds now  . Urinary frequency   . Urinary urgency   . Nocturia   . Urinary leakage   . Abnormality of gait 03/19/2013  . DVT of lower limb, acute 11/19/2013    Peroneal & distal tibial left 11/01/13  . Polio 1941    "mild case"  . CHF (congestive heart failure)     "low grade" (06/22/2014)  . DVT (deep venous thrombosis) 10/2013    LLE  . Pneumonia, bacterial     RML resolved, spirometry, normal  . Pneumonia 1933  . History of blood transfusion 05/2014  . Anemia 05/2014    "related to Xarelto"  . Iron deficiency anemia 05/2014    "got iron infusion"  . Arthritis     "all over"  . Gout   . Malignant melanoma of ear     right  . Thrombophlebitis of superficial veins of right lower extremity 06/23/2014  Incidental finding doppler 05/30/14  . Shortness of breath   . Esophageal varices without mention of bleeding 08/08/2014  . Portal vein thrombosis  September 2015   : .   Noninvasive low-grade papillary urothelial malignancy, bladder                      February 2016   Past Surgical History  Procedure Laterality Date  . Cystoscopy w/ stone manipulation  1950's  . Fem cutaneous nerve entrapment  1977    due to surgery (mild lat anesth of bilat legs)  . Limited pelvis/hip bone scan  04/99    negative  . Bone scan  04/00  . Cataract extraction w/ intraocular lens  implant, bilateral Bilateral ~ 2004  . Melanoma excision Right 06/04    ear; Wide local excision,,with flap  . Stress cardiolite  05/11/04    mild inf. ischemia, EF 71%  . Shoulder surgery Left 06/17/2005     "tore it all to pieces when I fell; not fractured"  . Bronchoscopy  09/08    RML collapse with chronic pneumonia, bx neg (Dr. Joya Gaskins)  . Cyst excision Right 03/07/09    middle finger, DIP Joint Mucoid (Dr. Fredna Dow)  . Eyelid & eyebrow lift  1996  . Cyst excision Right 08/15/09    middle finger, DIP Joint Mucoid Cyst (Dr. Fredna Dow)  . Appendectomy  1950  . Excision of mucoid tumor    . Colonoscopy    . Total knee arthroplasty  01/10/2012    Procedure: TOTAL KNEE ARTHROPLASTY;  Surgeon: Augustin Schooling, MD;  Location: [place];  Service: Orthopedics;  Laterality: Left;  . Surgery scrotal / testicular Left ~ 2000    removal of mass, noncancerous  . Cardiac catheterization  05/23/04    mild plague-statin  . Coronary angioplasty with stent placement  06/22/2014    to proximal LAD  . Inguinal hernia repair Bilateral 1959  . Inguinal hernia repair Left 1970's  . Esophagogastroduodenoscopy N/A 08/08/2014    Procedure: ESOPHAGOGASTRODUODENOSCOPY (EGD);  Surgeon: Gatha Mayer, MD;  Location: [facility] ENDOSCOPY;  Service: Endoscopy;  Laterality: N/A;  . Colonoscopy N/A 08/08/2014    Procedure: COLONOSCOPY;  Surgeon: Gatha Mayer, MD;  Location: [place];  Service: Endoscopy;  Laterality: N/A;  . Flexible sigmoidoscopy N/A 09/21/2014    Procedure: FLEXIBLE SIGMOIDOSCOPY;  Surgeon: Jerene Bears, MD;  Location: [place];  Service: Gastroenterology;  Laterality: N/A;  . Givens capsule study N/A 09/21/2014    Procedure: GIVENS CAPSULE STUDY;  Surgeon: Jerene Bears, MD;  Location: [facility] ENDOSCOPY;  Service: Endoscopy;  Laterality: N/A;  . Left and right heart catheterization with coronary angiogram N/A 06/22/2014    Procedure: LEFT AND RIGHT HEART CATHETERIZATION WITH CORONARY ANGIOGRAM;  Surgeon: Larey Dresser, MD;  Location: [facility] CATH LAB;  Service: Cardiovascular;  Laterality: N/A;  . Percutaneous coronary stent intervention (pci-s)  06/22/2014    Procedure: PERCUTANEOUS CORONARY STENT INTERVENTION (PCI-S);   Surgeon: Larey Dresser, MD;  Location: [facility] CATH LAB;  Service: Cardiovascular;;  . Colonoscopy N/A 01/25/2015    Procedure: COLONOSCOPY;  Surgeon: Gatha Mayer, MD;  Location: [facility] ENDOSCOPY;  Service: Endoscopy;  Laterality: N/A;  . Cystoscopy with biopsy Right 02/03/2015    Procedure: CYSTOSCOPY WITH COLD CUP BLADDER BIOPSY CAUTHERIZAITON OF RIGHT BLADDER CANCER AND SATILITE, TURBT RIGHT BLADDER BASE;  Surgeon: Ailene Rud, MD;  Location: [facility] ORS;  Service: Urology;  Laterality: Right;  . Esophagogastroduodenoscopy N/A 03/03/2015  Procedure: ESOPHAGOGASTRODUODENOSCOPY (EGD);  Surgeon: Jerene Bears, MD;  Location: [facility] END[facility];  Service: Gastroenterology;  Laterality: N/A;  :   Current facility-administered medications:  .  acetaminophen (TYLENOL) tablet 650 mg, 650 mg, Oral, Q6H PRN **OR** acetaminophen (TYLENOL) suppository 650 mg, 650 mg, Rectal, Q6H PRN, Toy Baker, MD .  allopurinol (ZYLOPRIM) tablet 150 mg, 150 mg, Oral, Daily, Toy Baker, MD, 150 mg at 03/06/15 1000 .  antiseptic oral rinse (CPC / CETYLPYRIDINIUM CHLORIDE 0.05%) solution 7 mL, 7 mL, Mouth Rinse, q12n4p, Toy Baker, MD, 7 mL at 03/06/15 1600 .  chlorhexidine (PERIDEX) 0.12 % solution 15 mL, 15 mL, Mouth Rinse, BID, Toy Baker, MD, 15 mL at 03/06/15 0758 .  dextrose 5 % and 0.45 % NaCl with KCl 20 mEq/L infusion, , Intravenous, Continuous, Jerene Bears, MD, Last Rate: 75 mL/hr at 03/05/15 1841 .  feeding supplement (ENSURE COMPLETE) (ENSURE COMPLETE) liquid 237 mL, 237 mL, Oral, BID BM, Anastassia Doutova, MD, 237 mL at 03/06/15 1400 .  finasteride (PROSCAR) tablet 5 mg, 5 mg, Oral, Daily, Toy Baker, MD, 5 mg at 03/06/15 1000 .  HYDROmorphone (DILAUDID) injection 1 mg, 1 mg, Intravenous, Q2H PRN, Toy Baker, MD, 1 mg at 03/06/15 1144 .  iron polysaccharides (NIFEREX) capsule 150 mg, 150 mg, Oral, BID, Toy Baker, MD, 150 mg at 03/06/15 1000 .  levofloxacin  (LEVAQUIN) IVPB 750 mg, 750 mg, Intravenous, Daily, Simbiso Ranga, MD, 750 mg at 03/06/15 1000 .  levothyroxine (SYNTHROID, LEVOTHROID) tablet 25 mcg, 25 mcg, Oral, QAC breakfast, Simbiso Ranga, MD, 25 mcg at 03/06/15 0757 .  morphine (MS CONTIN) 12 hr tablet 15 mg, 15 mg, Oral, Q12H, Simbiso Ranga, MD, 15 mg at 03/06/15 1708 .  ondansetron (ZOFRAN) injection 4 mg, 4 mg, Intravenous, Q6H, Jerene Bears, MD, 4 mg at 03/06/15 1457 .  oxyCODONE (Oxy IR/ROXICODONE) immediate release tablet 5-10 mg, 5-10 mg, Oral, Q4H PRN, Toy Baker, MD, 5 mg at 03/05/15 2059 .  pantoprazole (PROTONIX) injection 40 mg, 40 mg, Intravenous, Q24H, Jerene Bears, MD, 40 mg at 03/05/15 2106 .  pravastatin (PRAVACHOL) tablet 80 mg, 80 mg, Oral, q1800, Toy Baker, MD, 80 mg at 03/06/15 1708:  . allopurinol  150 mg Oral Daily  . antiseptic oral rinse  7 mL Mouth Rinse q12n4p  . chlorhexidine  15 mL Mouth Rinse BID  . feeding supplement (ENSURE COMPLETE)  237 mL Oral BID BM  . finasteride  5 mg Oral Daily  . iron polysaccharides  150 mg Oral BID  . levofloxacin (LEVAQUIN) IV  750 mg Intravenous Daily  . levothyroxine  25 mcg Oral QAC breakfast  . morphine  15 mg Oral Q12H  . ondansetron  4 mg Intravenous Q6H  . pantoprazole (PROTONIX) IV  40 mg Intravenous Q24H  . pravastatin  80 mg Oral q1800  :  Allergies  Allergen Reactions  . Sulfa Antibiotics Other (See Comments)    Caused patient have ITP  . Aleve [Naproxen Sodium] Other (See Comments)    Causes platlet drop  . Rofecoxib Other (See Comments)    Celebrex  - affected low platelets  . Sulfamethoxazole-Trimethoprim Other (See Comments)     Causes low platelets and bleeding  . Tape Other (See Comments)    Adhesive tape makes skin tear.  . Tramadol Hcl Other (See Comments)    dizziness, drowsiness  . Neomycin-Bacitracin Zn-Polymyx Rash  . Neosporin [Neomycin-Polymyxin-Gramicidin] Rash  . Penicillins Swelling and Rash    Took dose of  amoxicillin 2031m 02/02/15 at dentist office without any complications  :  Family History  Problem Relation Age of Onset  . Thyroid cancer Mother   . Diabetes Father   . Heart disease Father   . Stroke Brother     cerebral hemm  . Colon cancer Brother   . Anesthesia problems Neg Hx   . Hypotension Neg Hx   . Malignant hyperthermia Neg Hx   . Pseudochol deficiency Neg Hx   . Prostate cancer Neg Hx   . Heart attack Father   :  History   Social History  . Marital Status: Widowed    Spouse Name: N/A  . Number of Children: 2  . Years of Education: college   Occupational History  . Retired     1996 [employer]   Social History Main Topics  . Smoking status: Former Smoker -- 2.00 packs/day for 20 years    Types: Cigarettes, Cigars    Quit date: 09/27/1967  . Smokeless tobacco: Never Used  . Alcohol Use: No  . Drug Use: No  . Sexual Activity: Not on file   Other Topics Concern  . Not on file   Social History Narrative   Widowed 2013 after 63+ years of marriage, Wife had CHF and multiple myeloma   POlin Hauser daughter in lSports coach lives with patient   Patient does not get regular exercise   No caffeine   Retired from [employer]), retired from [employer]  :  Review of Systems:  Positives include:Right abdominal pain, anorexia, weight loss, nausea  A complete ROS was otherwise negative.   Physical Exam:  Blood pressure 130/52, pulse 60, temperature 97.5 F (36.4 C), temperature source Oral, resp. rate 20, height 5' 11.5" (1.816 m), weight 231 lb (104.781 kg), SpO2 98 %.  HEENT: Neck without mass  Lungs: Clear bilaterally  Cardiac: Regular rate and rhythm  Abdomen: No apparent ascites, no spinal megaly, fullness in the right upper abdomen with associated tenderness   Vascular: No leg edema  Lymph nodes: No cervical, supraclavicular, axillary, or inguinal nodes  Neurologic: Alert and oriented, follows commands, moves extremities  Skin: Right  dear melanoma excision site without evidence of recurrent tumor    LABS:   Recent Labs  03/05/15 0515 03/06/15 0440  WBC 8.1 7.4  HGB 9.2* 9.0*  HCT 29.8* 28.9*  PLT 316 281     Recent Labs  03/05/15 0515 03/06/15 0440  NA 135 136  K 4.7 4.6  CL 100 101  CO2 25 28  GLUCOSE 115* 107*  BUN 16 18  CREATININE 1.02 0.99  CALCIUM 8.7 8.9      RADIOLOGY:  Dg Chest 2 View  03/01/2015   CLINICAL DATA:  CHF.  EXAM: CHEST  2 VIEW  COMPARISON:  05/27/2014  FINDINGS: Heart size is normal. Negative for pulmonary edema. Densities at the left lung base and along the left cardiac border appear chronic. Atherosclerotic calcifications in the thoracic aorta. Negative for pleural effusions.  IMPRESSION: No acute chest findings.   Electronically Signed   By: AMarkus DaftM.D.   On: 03/01/2015 23:44   Ct Abdomen Pelvis W Contrast  03/01/2015   CLINICAL DATA:  Right flank pain which began 10 days ago. Progressively worsening. Bladder cancer diagnosed 02/03/2015  EXAM: CT ABDOMEN AND PELVIS WITH CONTRAST  TECHNIQUE: Multidetector CT imaging of the abdomen and pelvis was performed using the standard protocol following bolus administration of intravenous contrast.  CONTRAST:  136m OMNIPAQUE IOHEXOL 300 MG/ML SOLN, 589mOMNIPAQUE IOHEXOL 300 MG/ML SOLN  COMPARISON:  09/24/2014  FINDINGS: Lower chest: The lung bases are clear of acute process. Minimal dependent atelectasis and basilar scarring changes. The heart is normal in size coronary artery calcifications are noted. Moderate aortic calcifications. There is a small hiatal hernia.  Hepatobiliary: The E liver demonstrates a bizarre enhancement pattern due to portal vein thrombosis also demonstrated on the prior study. The right hepatic lobe liver lesions seen on the prior study have coalesced into a large mass occupying the inferior aspect of the right hepatic lobe.  Pancreas: Stable fatty changes.  No mass or inflammation.  Spleen: Normal size.  No focal  lesions.  Adrenals/Urinary Tract: Stable right adrenal gland angioma mild lipoma. The kidneys are unremarkable.  Stomach/Bowel: The stomach is unremarkable. There is a large duodenum mass likely accounting for the patient's hepatic metastatic disease. There is also now bulky portal, peripancreatic and celiac axis lymphadenopathy. A necrotic appearing node measures 4.1 x 2.7 cm on image number 24. The duodenum mass measures approximately 4.3 x 4.3 cm. The small bowel and colon are unremarkable. No inflammation or obstruction.  Vascular/Lymphatic: There is left-sided retroperitoneal lymphadenopathy in addition to the upper abdominal adenopathy surrounding the duodenum. The aorta demonstrates stable atherosclerotic calcifications. No focal aneurysm.  Pelvis: Without obvious discrete bladder mass. Prostate gland is enlarged. The seminal vesicles are unremarkable. No pelvic lymphadenopathy. No inguinal lymphadenopathy. A left inguinal hernia is noted containing fat. There is a lesion in the left ileus psoas muscle suspicious for a muscle metastasis. A liquified hematoma is also possible.  Musculoskeletal: No obvious destructive metastatic bone lesions. There is a stable sclerotic lesion involving the right acetabular. Vague lucencies are noted in the left iliac bone anteriorly.  IMPRESSION: 1. Bulky duodenal mass with significant adjacent right upper quadrant lymphadenopathy. There is also hepatic metastatic disease, retroperitoneal lymphadenopathy and portal vein thrombosis. 2. Possible metastatic lesion involving the left ileal psoas muscle. A liquified hematoma is also possible. 3. Stable small lucencies in the left iliac bone. No obvious osseous metastatic disease.   Electronically Signed   By: P.Marijo Sanes.D.   On: 03/01/2015 20:12   Dg Abd 2 Views  03/05/2015   CLINICAL DATA:  Abdominal distention and pain for 10 days. History of bowel obstruction and duodenal mass. Porta hepatis adenopathy and liver mass.   EXAM: ABDOMEN - 2 VIEW  COMPARISON:  03/01/2015  FINDINGS: Orally administered contrast medium is present in the colon. Thoracolumbar spondylosis. No free intraperitoneal gas.  No dilated bowel or abnormal air-fluid levels. Stable mixed sclerotic and lucent lesion of the right acetabular roof, stable.  IMPRESSION: 1. No dilated bowel or significant abnormal air fluid levels are observed to favor current obstruction. Orally administered contrast medium is present in the colon.   Electronically Signed   By: WaVan Clines.D.   On: 03/05/2015 17:25    Assessment and Plan:   1. Duodenal mass, abdominal/retroperitoneal lymphadenopathy, and liver metastases noted on a CT 03/01/2015  Upper endoscopy 03/03/2015 confirmed a duodenal mass, status post a biopsy with the final pathology pending  2. Gastric outlet obstruction secondary to #1  3. 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 Portal vein thrombosis diagnosed in September 2015  5. Left leg DVT December 2014  6. History of coronary artery disease, status post an LAD drug-eluting stent July 2015  7. Remote history of melanoma of the right ear  8. Noninvasive  low-grade papillary carcinoma the bladder February 2016  9. History of drug related ITP  10. Abdominal pain secondary to the duodenal mass/liver metastases/adenopathy   [name] presents with severe abdominal pain. The pain is most likely secondary to liver metastases in the setting of a duodenal mass and abdominal lymphadenopathy. I discussed the case with Dr. Saralyn Pilar. Preliminary review of the 03/03/2015 biopsy confirms a high-grade malignancy. The differential diagnosis includes non-Hodgkin's lymphoma, malignant melanoma, and carcinoma. Immunohistochemical stains should be available on 03/04/2015. I discussed the preliminary diagnosis with [name] and his family. Dr. Excell Seltzer has been consulted to consider a palliative bypass procedure. Surgery should be  considered unless the pathology reveals a tumor that is expected to respond quickly to systemic therapy.  Recommendations: 1. Follow-up on the final pathology from the duodenal mass biopsy 2. Hold anticoagulation 3. Decision on a gastrojejunostomy pending the final pathology result 4. I will continue following him in the hospital and he will be scheduled for an outpatient appointment at [facility].   James Coder, MD 03/06/2015, 5:46 PM

## 2015-03-07 ENCOUNTER — Other Ambulatory Visit: Payer: Medicare Other

## 2015-03-07 DIAGNOSIS — Z01810 Encounter for preprocedural cardiovascular examination: Secondary | ICD-10-CM

## 2015-03-07 DIAGNOSIS — I10 Essential (primary) hypertension: Secondary | ICD-10-CM

## 2015-03-07 DIAGNOSIS — I2510 Atherosclerotic heart disease of native coronary artery without angina pectoris: Secondary | ICD-10-CM

## 2015-03-07 LAB — SURGICAL PCR SCREEN
MRSA, PCR: NEGATIVE
STAPHYLOCOCCUS AUREUS: NEGATIVE

## 2015-03-07 MED ORDER — DEXTROSE 5 % IV SOLN
2.0000 g | INTRAVENOUS | Status: AC
  Filled 2015-03-07: qty 2

## 2015-03-07 NOTE — Progress Notes (Signed)
[facility] PROGRESS NOTE  James [name] AJO:[id number] DOB: 16-May-1928 [name]: 03/01/2015 PCP: [name], MD  Summary Appreciate GI/IR/Oncology/Surgery/cardiology. James [name] is a pleasant 81 yo male with history of coronary artery disease, diastolic heart failure, chronic anemia due to chronic blood loss, portal vein thrombosis, urothelial bladder CA(biopsy 02/06/15), who presented with excruciating right low quadrant pain and was found to have new fungating duodenal mass(confirmed to be poorly differentiated carcinoma) as well as large hepatic masses. GI perfomed EGD with biopsy on 03/03/15 and general surgery plans laparoscopic and possible open gastrojejunostomy on 02/1615. Patient seen by cardiology for risk stratification. Incidentally, patient was found to have hypothyroidism(tsh 12.2). Free T4 is normal, therefore he was started on low dose Synthroid. He was also found to have UTI butUrine culture was negative. He is on Levaquin to complete course. Patient was on Plavix for history of drug eluting stent placed in July 2015, but this was held at the time of admission in anticipation of surgical procedures. Will keep holding Plavix as patient may need more invasive procedures. Will defer to GI/Oncology/IR/Surgery regarding further work up and management. Will defer feeding plans to general surgery. Patient denies any complaints today Plan Duodenal mass/Hepatic metastases/Liver mass/Abdominal pain/Cancer related pain/Portal vein thrombosis  Reviewed pathology results-poorly differentiated adenocarcinoma  Defer management to GI/Dr. Benay Spice  Consider CT brain if evidence of confusion to exclude brain metastases.  4 laparoscopic/possible open gastrojejunostomy on 03/08/2015. Nothing by mouth after midnight. Hypothyroidism  Continue Synthroid  Needs follow up TFTs in 6 weeks. HYPERTENSION, BENIGN/CAD, NATIVE VESSEL/Chronic diastolic CHF (congestive heart failure)  Continue to hold  Plavix in view of pending intervention otherwise continue home medications  2-D echocardiogram  Follow cardiology recommendations Anemia due to chronic blood loss/Protein-calorie malnutrition, severe  Monitor hemoglobin, which is stable at present, and continue to hold antiplatelet/anticoagulant agents.  Supportive care UTI  Levaquin to complete 7 days. Code Status: Full Family Communication: Daughter at the bedside Disposition Plan: Eventually home   Consultants:  GI  Interventional cardiology  Oncology  General surgery  Cardiology  Procedures:  EGD on 03/03/2015  Antibiotics:  Levaquin 03/03/15>  HPI/Subjective: Denies abdominal pain  Objective: Filed Vitals:   03/07/15 2238  BP: 107/53  Pulse: 64  Temp: 97.6 F (36.4 C)  Resp: 18    Intake/Output Summary (Last 24 hours) at 03/07/15 2326 Last data filed at 03/07/15 0858  Gross per 24 hour  Intake    480 ml  Output      0 ml  Net    480 ml   Filed Weights   03/04/15 1338 03/05/15 0834 03/07/15 2238  Weight: 107.3 kg (236 lb 8.9 oz) 104.781 kg (231 lb) 103.5 kg (228 lb 2.8 oz)    Exam:   General:  Comfortable at rest.  Cardiovascular: S1-S2 normal. No murmurs. Pulse regular.  Respiratory: Good air entry bilaterally. No rhonchi or rales.  Abdomen: Abdominal distention  Musculoskeletal: No pedal edema   Neurological: Intact  Data Reviewed: Basic Metabolic Panel:  Recent Labs Lab 03/02/15 0507 03/03/15 0535 03/04/15 0507 03/05/15 0515 03/06/15 0440  NA 138 137 133* 135 136  K 3.9 4.4 4.7 4.7 4.6  CL 102 102 100 100 101  CO2 26 26 26 25 28   GLUCOSE 96 116* 125* 115* 107*  BUN 22 19 15 16 18   CREATININE 1.19 1.04 1.03 1.02 0.99  CALCIUM 9.0 9.1 8.8 8.7 8.9  MG 2.0  --   --   --   --  PHOS 4.8*  --   --   --   --    Liver Function Tests:  Recent Labs Lab 03/01/15 1500 03/02/15 0507 03/03/15 0535 03/04/15 0507  AST 25 26 24 22   ALT 37 33 33 32  ALKPHOS 140* 122*  124* 121*  BILITOT 0.8 0.9 0.4 0.5  PROT 7.4 5.9* 6.2 5.9*  ALBUMIN 3.7 3.0* 3.0* 2.8*    Recent Labs Lab 03/01/15 1500  LIPASE 21    Recent Labs Lab 03/04/15 0507  AMMONIA 31   CBC:  Recent Labs Lab 03/01/15 1500  03/02/15 0507 03/03/15 0535 03/04/15 0507 03/05/15 0515 03/06/15 0440  WBC 10.0  < > 8.1 9.4 9.7 8.1 7.4  NEUTROABS 7.3  --   --  7.2 8.3*  --   --   HGB 10.5*  < > 9.5* 9.5* 9.6* 9.2* 9.0*  HCT 34.0*  < > 30.6* 30.6* 31.3* 29.8* 28.9*  MCV 87.4  < > 88.4 87.7 88.9 88.4 89.2  PLT 382  < > 347 313 305 316 281  < > = values in this interval not displayed. Cardiac Enzymes: No results for input(s): CKTOTAL, CKMB, CKMBINDEX, TROPONINI in the last 168 hours. BNP (last 3 results) No results for input(s): BNP in the last 8760 hours.  ProBNP (last 3 results)  Recent Labs  05/29/14 1910 06/06/14 1244 11/29/14 0944  PROBNP 36.3 83.0 114.6    CBG: No results for input(s): GLUCAP in the last 168 hours.  Recent Results (from the past 240 hour(s))  Culture, Urine     Status: None   Collection Time: 03/02/15  8:40 PM  Result Value Ref Range Status   Specimen Description URINE, CLEAN CATCH  Final   Special Requests NONE  Final   Colony Count NO GROWTH Performed at [facility]   Final   Culture NO GROWTH Performed at [facility]   Final   Report Status 03/04/2015 FINAL  Final  Surgical pcr screen     Status: None   Collection Time: 03/07/15  2:20 PM  Result Value Ref Range Status   MRSA, PCR NEGATIVE NEGATIVE Final   Staphylococcus aureus NEGATIVE NEGATIVE Final    Comment:        The Xpert SA Assay (FDA approved for NASAL specimens in patients over 3 years of age), is one component of a comprehensive surveillance program.  Test performance has been validated by [facility] for patients greater than or equal to 85 year old. It is not intended to diagnose infection nor to guide or monitor treatment.      Studies: No  results found.  Scheduled Meds: . allopurinol  150 mg Oral Daily  . antiseptic oral rinse  7 mL Mouth Rinse q12n4p  . [START ON 03/08/2015] cefoTEtan (CEFOTAN) IV  2 g Intravenous On Call to OR  . chlorhexidine  15 mL Mouth Rinse BID  . feeding supplement (ENSURE COMPLETE)  237 mL Oral BID BM  . finasteride  5 mg Oral Daily  . iron polysaccharides  150 mg Oral BID  . levofloxacin (LEVAQUIN) IV  750 mg Intravenous Daily  . levothyroxine  25 mcg Oral QAC breakfast  . morphine  15 mg Oral Q12H  . ondansetron  4 mg Intravenous Q6H  . pantoprazole (PROTONIX) IV  40 mg Intravenous Q24H  . pravastatin  80 mg Oral q1800   Continuous Infusions: . dextrose 5 % and 0.45 % NaCl with KCl 20 mEq/L 75 mL/hr at 03/05/15  1841     Time spent: 25 minutes    Antinio Sanderfer  [facility] Pager [phone]. If 7PM-7AM, please contact night-coverage at [email or web address], password [facility] 03/07/2015, 11:26 PM  LOS: 6 days

## 2015-03-07 NOTE — Progress Notes (Signed)
[facility] Progress Note  Subjective:  Feels about the same as yesterday.  Belching a lot and passing flatus.  Tolerating small amounts of liquids.  Still has a lot of RUQ pain but pain meds definitely help.  Objective:  Vital signs in last 24 hours: Temp:  [97.5 F (36.4 C)-98 F (36.7 C)] 98 F (36.7 C) (03/15 0520) Pulse Rate:  [56-64] 56 (03/15 0520) Resp:  [20] 20 (03/15 0520) BP: (100-130)/(50-52) 100/50 mmHg (03/15 0520) SpO2:  [94 %-98 %] 94 % (03/15 0520) Last BM Date: 03/03/15 General:  Alert, Well-developed, in NAD Heart:  Slightly bradycardic. Pulm:  CTAB.  No W/R/R. Abdomen:  Soft, non-distended.  BS present.  Diffuse TTP > in the RUQ. Extremities:  Without edema. Neurologic:  Alert and  oriented x4;  grossly normal neurologically. Psych:  Alert and cooperative. Normal mood and affect.  Intake/Output from previous day: 03/14 0701 - 03/15 0700 In: 1260 [P.O.:360; I.V.:900] Out: -  Intake/Output this shift: Total I/O In: 480 [P.O.:480] Out: -   Lab Results:  Recent Labs  03/05/15 0515 03/06/15 0440  WBC 8.1 7.4  HGB 9.2* 9.0*  HCT 29.8* 28.9*  PLT 316 281   BMET  Recent Labs  03/05/15 0515 03/06/15 0440  NA 135 136  K 4.7 4.6  CL 100 101  CO2 25 28  GLUCOSE 115* 107*  BUN 16 18  CREATININE 1.02 0.99  CALCIUM 8.7 8.9   Dg Abd 2 Views  03/05/2015   CLINICAL DATA:  Abdominal distention and pain for 10 days. History of bowel obstruction and duodenal mass. Porta hepatis adenopathy and liver mass.  EXAM: ABDOMEN - 2 VIEW  COMPARISON:  03/01/2015  FINDINGS: Orally administered contrast medium is present in the colon. Thoracolumbar spondylosis. No free intraperitoneal gas.  No dilated bowel or abnormal air-fluid levels. Stable mixed sclerotic and lucent lesion of the right acetabular roof, stable.  IMPRESSION: 1. No dilated bowel or significant abnormal air fluid levels are observed to favor current obstruction. Orally administered  contrast medium is present in the colon.   Electronically Signed   By: Van Clines M.D.   On: 03/05/2015 17:25    Assessment / Plan: 81 yo male with hx of PVT with portal hypertension, occult GI bleeding, CAD, CHF, DVT, IDA, bradycardia, HL, arthritis admitted with RUQ pain and CT showing duodenal mass with concern for hepatic metastatic disease  1. Duodenal mass with partial obstruction/liver mets -- waiting on final pathology, but preliminary from pathologist shows high-grade carcinoma, not classically appearing like duodenal adenocarcinoma but should have result possibly today as to whether it is lymphoma, melanoma, etc. He feels about the same as yesterday; he has not required NGT at this point. This process seems to be very aggressive and moving quickly (also evidenced by the fact it was not seen at [facility] done earlier this year).  --seen by Dr. Benay Spice who is following along. --Liquid diet only, and NPO if worsening pain or vomiting. May need NGT if symptoms worsen. --may need TPN --may need diverting gastro-j vs.oncology treatment (chemo/xrt) to shrink tumor. We do not think that this is amenable to endoscopic stent.  Dr. Excell Seltzer saw the patient and is considering gastro-jejunostomy possibly tomorrow. --Continue to ambulate as much as possible --Hemoglobin is fairly stable without evidence for bleeding. --Continue to hold antiplatelet and anticoagulants (Plavix)   LOS: 6 days   ZEHR, JESSICA D.  03/07/2015, 9:09 AM  Pager number [phone]  GI ATTENDING  Interval history and data reviewed. Agree with progress note as outlined above. Biopsies show cancer. Oncology on board. Surgery has seen for gastrojejunostomy for duodenal obstruction. We're available if needed for questions or problems. Thank you  Docia Chuck. Geri Seminole., M.D. [facility] Division of Gastroenterology

## 2015-03-07 NOTE — Consult Note (Signed)
CARDIOLOGY CONSULT NOTE   Patient ID: James [name] MRN: [id number], DOB/AGE: January 27, 1928   Admit date: 03/01/2015 Date of Consult: 03/07/2015  Primary Physician: [name], MD Primary Cardiologist: Dr Aundra Dubin  Reason for consult:  Preoperative consultation for Duodenal mass with liver metastasis, with new gastric outlet obstruction  Problem List  Past Medical History  Diagnosis Date  . Bradycardia     Hypertensive  . carotid bruit, right   . hypercholesterolemia     Trig 234/293;takes Simcor daily  . Obesity, morbid   . Vasculitis   . 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 Beryle Beams; Dr. Beryle Beams , normal platelets  . Dermatitis     legs  . Bleeding gums   . Chronic low back pain   . Myalgia and myositis   . Unspecified vitamin D deficiency   . Bladder diverticulum   . Primary osteoarthritis of both knees   . Nephrolithiasis   . History of elevated glucose   . Thrombophlebitis     right forearm  . History of BPH   . History of colonic polyps     [name]  . Epididymitis, left     S/P Epidimectomy  . Splenomegaly 05/15/09    abd U/S NML  . Hypertension, benign     takes Micardis daily  . Neuropathy     acute  . Bruises easily   . H/O hiatal hernia   . GERD (gastroesophageal reflux disease)     hx of but doesn't take any meds now  . Urinary frequency   . Urinary urgency   . Nocturia   . Urinary leakage   . Abnormality of gait 03/19/2013  . DVT of lower limb, acute 11/19/2013    Peroneal & distal tibial left 11/01/13  . Polio 1941    "mild case"  . CHF (congestive heart failure)     "low grade" (06/22/2014)  . DVT (deep venous thrombosis) 10/2013    LLE  . Pneumonia, bacterial     RML resolved, spirometry, normal  . Pneumonia 1933  . History of blood transfusion 05/2014  . Anemia 05/2014    "related to Xarelto"  . Iron deficiency anemia 05/2014    "got iron infusion"  . Arthritis     "all over"  . Gout    . Malignant melanoma of ear     right  . Thrombophlebitis of superficial veins of right lower extremity 06/23/2014    Incidental finding doppler 05/30/14  . Shortness of breath   . Esophageal varices without mention of bleeding 08/08/2014  . Portal vein thrombosis     Past Surgical History  Procedure Laterality Date  . Cystoscopy w/ stone manipulation  1950's  . Fem cutaneous nerve entrapment  1977    due to surgery (mild lat anesth of bilat legs)  . Limited pelvis/hip bone scan  04/99    negative  . Bone scan  04/00  . Cataract extraction w/ intraocular lens  implant, bilateral Bilateral ~ 2004  . Melanoma excision Right 06/04    ear; Wide local excision,,with flap  . Stress cardiolite  05/11/04    mild inf. ischemia, EF 71%  . Shoulder surgery Left 06/17/2005    "tore it all to pieces when I fell; not fractured"  . Bronchoscopy  09/08    RML collapse with chronic pneumonia, bx neg (Dr. Joya Gaskins)  . Cyst excision Right 03/07/09    middle finger, DIP Joint Mucoid (Dr. Fredna Dow)  .  Eyelid & eyebrow lift  1996  . Cyst excision Right 08/15/09    middle finger, DIP Joint Mucoid Cyst (Dr. Fredna Dow)  . Appendectomy  1950  . Excision of mucoid tumor    . Colonoscopy    . Total knee arthroplasty  01/10/2012    Procedure: TOTAL KNEE ARTHROPLASTY;  Surgeon: Augustin Schooling, MD;  Location: [place];  Service: Orthopedics;  Laterality: Left;  . Surgery scrotal / testicular Left ~ 2000    removal of mass, noncancerous  . Cardiac catheterization  05/23/04    mild plague-statin  . Coronary angioplasty with stent placement  06/22/2014    to proximal LAD  . Inguinal hernia repair Bilateral 1959  . Inguinal hernia repair Left 1970's  . Esophagogastroduodenoscopy N/A 08/08/2014    Procedure: ESOPHAGOGASTRODUODENOSCOPY (EGD);  Surgeon: Gatha Mayer, MD;  Location: [facility] ENDOSCOPY;  Service: Endoscopy;  Laterality: N/A;  . Colonoscopy N/A 08/08/2014    Procedure: COLONOSCOPY;  Surgeon: Gatha Mayer, MD;   Location: [place];  Service: Endoscopy;  Laterality: N/A;  . Flexible sigmoidoscopy N/A 09/21/2014    Procedure: FLEXIBLE SIGMOIDOSCOPY;  Surgeon: Jerene Bears, MD;  Location: [place];  Service: Gastroenterology;  Laterality: N/A;  . Givens capsule study N/A 09/21/2014    Procedure: GIVENS CAPSULE STUDY;  Surgeon: Jerene Bears, MD;  Location: [facility] ENDOSCOPY;  Service: Endoscopy;  Laterality: N/A;  . Left and right heart catheterization with coronary angiogram N/A 06/22/2014    Procedure: LEFT AND RIGHT HEART CATHETERIZATION WITH CORONARY ANGIOGRAM;  Surgeon: Larey Dresser, MD;  Location: [facility] CATH LAB;  Service: Cardiovascular;  Laterality: N/A;  . Percutaneous coronary stent intervention (pci-s)  06/22/2014    Procedure: PERCUTANEOUS CORONARY STENT INTERVENTION (PCI-S);  Surgeon: Larey Dresser, MD;  Location: [facility] CATH LAB;  Service: Cardiovascular;;  . Colonoscopy N/A 01/25/2015    Procedure: COLONOSCOPY;  Surgeon: Gatha Mayer, MD;  Location: [facility] ENDOSCOPY;  Service: Endoscopy;  Laterality: N/A;  . Cystoscopy with biopsy Right 02/03/2015    Procedure: CYSTOSCOPY WITH COLD CUP BLADDER BIOPSY CAUTHERIZAITON OF RIGHT BLADDER CANCER AND SATILITE, TURBT RIGHT BLADDER BASE;  Surgeon: Ailene Rud, MD;  Location: [facility] ORS;  Service: Urology;  Laterality: Right;  . Esophagogastroduodenoscopy N/A 03/03/2015    Procedure: ESOPHAGOGASTRODUODENOSCOPY (EGD);  Surgeon: Jerene Bears, MD;  Location: [facility] ENDOSCOPY;  Service: Gastroenterology;  Laterality: N/A;     Allergies  Allergies  Allergen Reactions  . Sulfa Antibiotics Other (See Comments)    Caused patient have ITP  . Aleve [Naproxen Sodium] Other (See Comments)    Causes platlet drop  . Rofecoxib Other (See Comments)    Celebrex  - affected low platelets  . Sulfamethoxazole-Trimethoprim Other (See Comments)     Causes low platelets and bleeding  . Tape Other (See Comments)    Adhesive tape makes skin tear.  . Tramadol Hcl Other (See  Comments)    dizziness, drowsiness  . Neomycin-Bacitracin Zn-Polymyx Rash  . Neosporin [Neomycin-Polymyxin-Gramicidin] Rash  . Penicillins Swelling and Rash    Took dose of amoxicillin 2045m 02/02/15 at dentist office without any complications    HPI  81yo with history of  CAD and chronic diastolic CHF [facility] with abdominal pain and was found to have Duodenal mass, abdominal/retroperitoneal lymphadenopathy, and liver metastases noted on a CT 03/01/2015. This is causing gastric outlet obstruction and the patient is scheduled for a a gastrojejunostomy tomorrow.  We were called for preop evaluation.  The patient was just seen by Dr Aundra Dubin in [place] clinic on 02/28/3015.  His history is complex:  Patient has been on Lasix 80 mg daily for several years now. He has chronic exertional dyspnea that waxes and wanes. He had an echo in 12/14 with EF 60-65% and aortic sclerosis without significant stenosis. He was admitted in 6/15 with dyspnea. CTA chest was negative for PE. Lower extremity dopplers showed a superficial thrombus but no DVT. He was found to be anemic with hemoglobin 7.9. He got 1 unit PRBCs and IV iron. He also was diuresed with IV Lasix. He says that he really did not feel much better. He did not have overt GI bleeding. Lexiscan Cardiolite was done in 6/15, showing basal to mid inferior mild ischemia (overall low risk study).   Given ongoing significant exertional symptoms, I took him for [facility] in 7/15. RHC showed normal filling pressures and cardiac output. LHC showed a borderline LAD stenosis. FFR was done, suggesting that the stenosis was hemodynamically significant. Patient had DES to LAD. He saw Dr. Beryle Beams shortly thereafter, and it was decided that he was low risk for future DVT so Xarelto was not started.   After the PCI, patient did not note any significant change to his chronic exertional dyspnea. He was diagnosed with portal vein thrombosis  in 9/15 and started on coumadin. He then had hematochezia and was admitted to [facility] later in 9/15. Possible small bowel AVMs. Capsule endoscopy was negative. Last CT abdomen in 10/15 showed small ascites and subtotal portal vein occlusion. He did not have cirrhosis.   He has continued to have suspected slow GI blood loss possibly from small bowel AVMs. EGD in 2/16 showed no bleeding site and capsule endoscopy in 2/16 showed a site of bleeding in the terminal ileium. Around Thanksgiving, he developed gross hematuria. He saw urology and was found to have a bladder tumor. He had transurethral resection of the tumor in 2/16 with pathology showing a noninvasive low grade papillary urothelial malignancy. He will be getting intravesical mitomycin C> Due to bleeding issues, Dr Beryle Beams took him off warfarin. He is still anemic and had transfusion of 2 units last week. He has the same exertional dyspnea as in the past. He is short of breath after walking about 50 feet. No chest pain. Weight is down 31 lbs since September 2015.  Today the patient states that his symptoms haven't changed since the last visit, He is chest pain free, was able to walk in hospital hallways with no SOB, he denies orthopnea or PND. He has no palpitations. His only complain is abdominal pain that is under control with Dilaudid. He has been off Plavix and ASA since 3/10.     Inpatient Medications  . allopurinol  150 mg Oral Daily  . antiseptic oral rinse  7 mL Mouth Rinse q12n4p  . [START ON 03/08/2015] cefoTEtan (CEFOTAN) IV  2 g Intravenous On Call to OR  . chlorhexidine  15 mL Mouth Rinse BID  . feeding supplement (ENSURE COMPLETE)  237 mL Oral BID BM  . finasteride  5 mg Oral Daily  . iron polysaccharides  150 mg Oral BID  . levofloxacin (LEVAQUIN) IV  750 mg Intravenous Daily  . levothyroxine  25 mcg Oral QAC breakfast  . morphine  15 mg Oral Q12H  . ondansetron  4 mg Intravenous Q6H  . pantoprazole  (PROTONIX) IV  40 mg Intravenous Q24H  . pravastatin  80 mg Oral q1800  Family History Family History  Problem Relation Age of Onset  . Thyroid cancer Mother   . Diabetes Father   . Heart disease Father   . Stroke Brother     cerebral hemm  . Colon cancer Brother   . Anesthesia problems Neg Hx   . Hypotension Neg Hx   . Malignant hyperthermia Neg Hx   . Pseudochol deficiency Neg Hx   . Prostate cancer Neg Hx   . Heart attack Father      Social History History   Social History  . Marital Status: Widowed    Spouse Name: N/A  . Number of Children: 2  . Years of Education: college   Occupational History  . Retired     1996 [employer]   Social History Main Topics  . Smoking status: Former Smoker -- 2.00 packs/day for 20 years    Types: Cigarettes, Cigars    Quit date: 09/27/1967  . Smokeless tobacco: Never Used  . Alcohol Use: No  . Drug Use: No  . Sexual Activity: Not on file   Other Topics Concern  . Not on file   Social History Narrative   Widowed 2013 after 63+ years of marriage, Wife had CHF and multiple myeloma   [name], daughter in Sports coach, lives with patient   Patient does not get regular exercise   No caffeine   Retired from [employer] ([employer]), retired from [place]:  No chills, fever, night sweats or weight changes.  Cardiovascular:  No chest pain, dyspnea on exertion, edema, orthopnea, palpitations, paroxysmal nocturnal dyspnea. Dermatological: No rash, lesions/masses Respiratory: No cough, dyspnea Urologic: No hematuria, dysuria Abdominal:   No nausea, vomiting, diarrhea, bright red blood per rectum, melena, or hematemesis Neurologic:  No visual changes, wkns, changes in mental status. All other systems reviewed and are otherwise negative except as noted above.  Physical Exam  Blood pressure 120/53, pulse 65, temperature 97.4 F (36.3 C), temperature source Oral, resp. rate 20, height 5'  11.5" (1.816 m), weight 231 lb (104.781 kg), SpO2 97 %.  General: Pleasant, NAD Psych: Normal affect. Neuro: Alert and oriented X 3. Moves all extremities spontaneously. HEENT: Normal  Neck: Supple without bruits or JVD. Lungs:  Resp regular and unlabored, CTA. Heart: RRR no s3, s4, or murmurs. Abdomen: Soft, non-tender, non-distended, BS + x 4.  Extremities: No clubbing, cyanosis or edema. DP/PT/Radials 2+ and equal bilaterally.  Labs  No results for input(s): CKTOTAL, CKMB, TROPONINI in the last 72 hours. Lab Results  Component Value Date   WBC 7.4 03/06/2015   HGB 9.0* 03/06/2015   HCT 28.9* 03/06/2015   MCV 89.2 03/06/2015   PLT 281 03/06/2015    Recent Labs Lab 03/04/15 0507  03/06/15 0440  NA 133*  < > 136  K 4.7  < > 4.6  CL 100  < > 101  CO2 26  < > 28  BUN 15  < > 18  CREATININE 1.03  < > 0.99  CALCIUM 8.8  < > 8.9  PROT 5.9*  --   --   BILITOT 0.5  --   --   ALKPHOS 121*  --   --   ALT 32  --   --   AST 22  --   --   GLUCOSE 125*  < > 107*  < > = values in this interval not displayed. Lab Results  Component  Value Date   CHOL 144 10/24/2014   HDL 28.50* 10/24/2014   LDLCALC 54 10/22/2013   TRIG 206.0* 10/24/2014   Lab Results  Component Value Date   DDIMER 6.08* 09/05/2014   Invalid input(s): POCBNP  Radiology/Studies  Dg Chest 2 View  03/01/2015   CLINICAL DATA:  CHF.  EXAM: CHEST  2 VIEW  COMPARISON:  05/27/2014  FINDINGS: Heart size is normal. Negative for pulmonary edema. Densities at the left lung base and along the left cardiac border appear chronic. Atherosclerotic calcifications in the thoracic aorta. Negative for pleural effusions.  IMPRESSION: No acute chest findings.   Electronically Signed   By: Markus Daft M.D.   On: 03/01/2015 23:44   Ct Abdomen Pelvis W Contrast  03/01/2015   CLINICAL DATA:  Right flank pain which began 10 days ago. Progressively worsening. Bladder cancer diagnosed 02/03/2015 IMPRESSION: 1. Bulky duodenal mass with  significant adjacent right upper quadrant lymphadenopathy. There is also hepatic metastatic disease, retroperitoneal lymphadenopathy and portal vein thrombosis. 2. Possible metastatic lesion involving the left ileal psoas muscle. A liquified hematoma is also possible. 3. Stable small lucencies in the left iliac bone. No obvious osseous metastatic disease.     Dg Abd 2 Views  03/05/2015   CLINICAL DATA:  Abdominal distention and pain for 10 days.  IMPRESSION: 1. No dilated bowel or significant abnormal air fluid levels are observed to favor current obstruction. Orally administered contrast medium is present in the colon.   Electronically   Echocardiogram - 12/08/2013 Left ventricle: The cavity size was normal. There was mild concentric hypertrophy. Systolic function was normal. The estimated ejection fraction was in the range of 60% to 65%. Wall motion was normal; there were no regional wall motion abnormalities. Doppler parameters are consistent with abnormal left ventricular relaxation (grade 1 diastolic dysfunction). - Aortic valve: Sclerosis without stenosis. - Left atrium: The atrium was mildly dilated.  ECG: Sinus bradycardia, 59 BPM, otherwise normal ECG, unchanged from 10/31/2014  Lexiscan nuclear stress test: 05/2014 Impression Exercise Capacity: Lexiscan with no exercise. BP Response: Hypertensive blood pressure response to 220/50. Clinical Symptoms: Mild shortness of breath ECG Impression: No significant ST segment change suggestive of  ischemia. Comparison with Prior Nuclear Study: No significant change from  previous study  Overall Impression: Low risk stress nuclear study demonstrating  a small region of upper-mid to basal mild ischemia with normal LV contractility unchanged from 2005.  LV Wall Motion: NL LV Function, EF 74%; NL Wall Motion  KELLY,THOMAS A, MD 06/17/2014 4:40 PM  Left cardiac cath: 06/22/2014 Coronary Anatomy: Reviewed from Dr. Claris Gladden  note  Left mainstem: Short with luminal irregularity.   Left Anterior Descending (LAD): 60-70% proximal LAD stenosis after the 1st diagonal. The remainder of the vessel is relatively free of disease.  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 30% mid LCx stenosis. Moderate OM1, moderate OM2, PLOM, and left PDA with luminal irregularities POST-OPERATIVE DIAGNOSIS:   Hemodynamically significant early mid LAD lesion with FFR of 0.73, treated with Xience Element DES 3.25 mm x 15 mm, post-dilated to 3.45 mm     ASSESSMENT AND PLAN  81 year old male admitted with duodenal mass causing gastric outlet obstruction scheduled for a gastrojejunostomy tomorrow.   1. CAD - the patient is off ASA and plavix since 03/02/15 for surgery, it has been 8 months since his stent, his EGG today is unchanged from prior. We will follow closely. He has been on Plavix > 6 months s/p  2nd generation DES, restart ASA post surgery as soon as ok with surgery. Continue pravastatin, no betablocker as he is bradycardic at baseline.  2. Chronic diastolic CHF - he was on lasix 0 mg po daily prior to the admission, we will monitor post surgery for fluid overload and management.   3. HTN: controlled.  4. Preoperative risk assessment: The patient is considered an intermediate risk for an intermediate risk surgery. He currently has no signs of angina of CHF and has no other contraindicatiosn for planned surgery.   Signed, Dorothy Spark, MD, [facility] 03/07/2015, 7:16 PM

## 2015-03-07 NOTE — Progress Notes (Signed)
IP PROGRESS NOTE  Subjective:   James [name] continues to have abdominal pain. He saw Dr. Excell Seltzer today and is scheduled for a gastrojejunostomy tomorrow.  Objective: Vital signs in last 24 hours: Blood pressure 120/53, pulse 65, temperature 97.4 F (36.3 C), temperature source Oral, resp. rate 20, height 5' 11.5" (1.816 m), weight 231 lb (104.781 kg), SpO2 97 %.  Intake/Output from previous day: 03/14 0701 - 03/15 0700 In: 1260 [P.O.:360; I.V.:900] Out: -   Physical Exam:  Abdomen: Distended, tender in the right upper abdomen, no discrete mass GU: Testes without mass Extremities: No leg edema   Lab Results:  Recent Labs  2015/03/13 0515 03/06/15 0440  WBC 8.1 7.4  HGB 9.2* 9.0*  HCT 29.8* 28.9*  PLT 316 281    BMET  Recent Labs  03/13/2015 0515 03/06/15 0440  NA 135 136  K 4.7 4.6  CL 100 101  CO2 25 28  GLUCOSE 115* 107*  BUN 16 18  CREATININE 1.02 0.99  CALCIUM 8.7 8.9    Studies/Results: Dg Abd 2 Views  03/13/2015   CLINICAL DATA:  Abdominal distention and pain for 10 days. History of bowel obstruction and duodenal mass. Porta hepatis adenopathy and liver mass.  EXAM: ABDOMEN - 2 VIEW  COMPARISON:  03/01/2015  FINDINGS: Orally administered contrast medium is present in the colon. Thoracolumbar spondylosis. No free intraperitoneal gas.  No dilated bowel or abnormal air-fluid levels. Stable mixed sclerotic and lucent lesion of the right acetabular roof, stable.  IMPRESSION: 1. No dilated bowel or significant abnormal air fluid levels are observed to favor current obstruction. Orally administered contrast medium is present in the colon.   Electronically Signed   By: Van Clines M.D.   On: 03-13-15 17:25    Medications: I have reviewed the patient's current medications.  Assessment/Plan:  1.Duodenal mass, abdominal/retroperitoneal lymphadenopathy, and liver metastases noted on a CT 03/01/2015  Upper endoscopy 03/03/2015 confirmed a duodenal mass, status  post a biopsy with the final pathology confirming a carcinoma, immunohistochemical stains negative for lymphoma and melanoma  2. Gastric outlet obstruction secondary to #1  3. 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 Portal vein thrombosis diagnosed in September 2015  5. Left leg DVT December 2014  6. History of coronary artery disease, status post an LAD drug-eluting stent July 2015  7. Remote history of melanoma of the right ear  8. 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 History of drug related ITP  10. Abdominal pain secondary to the duodenal mass/liver metastases/adenopathy  James [name] appears unchanged. I discussed the pathology findings with James [name] in his family. He has a poorly differentiate a carcinoma involving the duodenal mass. A gastrointestinal primary is most likely. Dr. Saralyn Pilar plans additional imaging histochemical stains to rule out a high-grade neuroendocrine tumor. I agree with the plan for a palliative gastrojejunostomy. We will consider supportive care versus a trial of systemic therapy pending his recovery from surgery.    LOS: 6 days   Emily Forse, Dominica Severin  03/07/2015, 4:29 PM

## 2015-03-07 NOTE — Progress Notes (Signed)
4 Days Post-Op  Subjective: He is stable and awaiting all the information and final decision making about treatment. He remains quite tender RUQ.    Objective: Vital signs in last 24 hours: Temp:  [97.5 F (36.4 C)-98 F (36.7 C)] 98 F (36.7 C) (03/15 0520) Pulse Rate:  [56-64] 56 (03/15 0520) Resp:  [20] 20 (03/15 0520) BP: (100-130)/(50-52) 100/50 mmHg (03/15 0520) SpO2:  [94 %-98 %] 94 % (03/15 0520) Last BM Date: 03/03/15 360 PO yesterday  Diet:  clears 480 Po this AM Afebrile, VSS, BP down some yesterday Labs stable Intake/Output from previous day: 03/14 0701 - 03/15 0700 In: 1260 [P.O.:360; I.V.:900] Out: -  Intake/Output this shift: Total I/O In: 480 [P.O.:480] Out: -   General appearance: alert, cooperative and no distress Resp: clear to auscultation bilaterally Chest wall: no tenderness, chest is clear to Ascultation GI: tender, and a little distended, + BS, doesn't take much PO becasue he fills up so fast  Lab Results:   Recent Labs  03/26/15 0515 03/06/15 0440  WBC 8.1 7.4  HGB 9.2* 9.0*  HCT 29.8* 28.9*  PLT 316 281    BMET  Recent Labs  03/26/2015 0515 03/06/15 0440  NA 135 136  K 4.7 4.6  CL 100 101  CO2 25 28  GLUCOSE 115* 107*  BUN 16 18  CREATININE 1.02 0.99  CALCIUM 8.7 8.9   PT/INR No results for input(s): LABPROT, INR in the last 72 hours.   Recent Labs Lab 03/01/15 1500 03/02/15 0507 03/03/15 0535 03/04/15 0507  AST 25 26 24 22   ALT 37 33 33 32  ALKPHOS 140* 122* 124* 121*  BILITOT 0.8 0.9 0.4 0.5  PROT 7.4 5.9* 6.2 5.9*  ALBUMIN 3.7 3.0* 3.0* 2.8*     Lipase     Component Value Date/Time   LIPASE 21 03/01/2015 1500     Studies/Results: Dg Abd 2 Views  March 26, 2015   CLINICAL DATA:  Abdominal distention and pain for 10 days. History of bowel obstruction and duodenal mass. Porta hepatis adenopathy and liver mass.  EXAM: ABDOMEN - 2 VIEW  COMPARISON:  03/01/2015  FINDINGS: Orally administered contrast medium is  present in the colon. Thoracolumbar spondylosis. No free intraperitoneal gas.  No dilated bowel or abnormal air-fluid levels. Stable mixed sclerotic and lucent lesion of the right acetabular roof, stable.  IMPRESSION: 1. No dilated bowel or significant abnormal air fluid levels are observed to favor current obstruction. Orally administered contrast medium is present in the colon.   Electronically Signed   By: Van Clines M.D.   On: 2015/03/26 17:25    Medications: . allopurinol  150 mg Oral Daily  . antiseptic oral rinse  7 mL Mouth Rinse q12n4p  . chlorhexidine  15 mL Mouth Rinse BID  . feeding supplement (ENSURE COMPLETE)  237 mL Oral BID BM  . finasteride  5 mg Oral Daily  . iron polysaccharides  150 mg Oral BID  . levofloxacin (LEVAQUIN) IV  750 mg Intravenous Daily  . levothyroxine  25 mcg Oral QAC breakfast  . morphine  15 mg Oral Q12H  . ondansetron  4 mg Intravenous Q6H  . pantoprazole (PROTONIX) IV  40 mg Intravenous Q24H  . pravastatin  80 mg Oral q1800   . dextrose 5 % and 0.45 % NaCl with KCl 20 mEq/L 75 mL/hr at 2015-03-26 1841   Prior to Admission medications   Medication Sig Start Date End Date Taking? Authorizing Provider  allopurinol (ZYLOPRIM) 300  MG tablet Take 0.5 tablets (150 mg total) by mouth every morning. 10/31/14  Yes Tonia Ghent, MD  aspirin EC 81 MG tablet Take 81 mg by mouth every evening.    Yes Historical Provider, MD  Cholecalciferol (VITAMIN D3) 2000 UNITS capsule Take 2,000 Units by mouth every morning.    Yes Historical Provider, MD  clopidogrel (PLAVIX) 75 MG tablet Take 75 mg by mouth every morning.    Yes Historical Provider, MD  finasteride (PROSCAR) 5 MG tablet Take 5 mg by mouth daily.   Yes Historical Provider, MD  furosemide (LASIX) 40 MG tablet Take 1 tablet (40 mg total) by mouth daily. 02/28/15  Yes Larey Dresser, MD  iron polysaccharides (NIFEREX) 150 MG capsule Take 150 mg by mouth 2 (two) times daily.   Yes Historical Provider, MD   LIVALO 4 MG TABS TAKE 1 TABLET AT BEDTIME 10/03/14  Yes Jolaine Artist, MD  Multiple Vitamin (MULTIVITAMIN WITH MINERALS) TABS tablet Take 1 tablet by mouth daily.   Yes Historical Provider, MD  OVER THE COUNTER MEDICATION Place 2 drops into both eyes 4 (four) times daily. Wash type eye drop-   Yes Historical Provider, MD  oxyCODONE-acetaminophen (ROXICET) 5-325 MG per tablet Take 1 tablet by mouth every 4 (four) hours as needed for severe pain. 02/03/15  Yes Carolan Clines, MD  pantoprazole (PROTONIX) 40 MG tablet Take 40 mg by mouth at bedtime.   Yes Historical Provider, MD  phenazopyridine (PYRIDIUM) 200 MG tablet Take 1 tablet (200 mg total) by mouth 3 (three) times daily as needed for pain. 02/03/15  Yes Carolan Clines, MD  Probiotic Product (PROBIOTIC & ACIDOPHILUS EX ST PO) Take 1 tablet by mouth every morning.    Yes Historical Provider, MD  Psyllium (METAMUCIL PO) Take 20 mLs by mouth every evening.   Yes Historical Provider, MD  ranitidine (ZANTAC) 150 MG tablet Take 150 mg by mouth at bedtime.   Yes Historical Provider, MD  spironolactone (ALDACTONE) 25 MG tablet Take 1 tablet (25 mg total) by mouth daily. 10/26/14  Yes Larey Dresser, MD  furosemide (LASIX) 40 MG tablet Take 1 tablet (40 mg total) by mouth daily. 03/02/15   Jolaine Artist, MD  nitroGLYCERIN (NITROSTAT) 0.4 MG SL tablet Place 1 tablet (0.4 mg total) under the tongue every 5 (five) minutes as needed. For chest pain 07/07/14   Larey Dresser, MD    Assessment/Plan 1.  Duodenal mass with liver metastasis, with new gastric outlet obstruction S/p EGD with biopsy 03/03/15 Dr. Zenovia Jarred Path pending 2.  History of Melanoma 3.  Papillary urothelial cancer  4.  CAD/ hx of DES/Aortic stenosis/hx of chronic diastolic CHF (last stress test 06/18/15 shows low risk study EF 74%) CATH 06/22/14:  60-70%LAD, LCIRC aneurysmal, 30% CJRC  RCA small. Dr. Loralie Champagne. 5.  ITP - thought to be due to septra 6.  PORTAL VEIN  THROMBOSIS 7.  Hypertension 8.  Hx of peripheral neuropathy, positional Vertigo 9.  Anemia/recurrent GI bleed 10.  PCM 11.  UTI 12.  Hx of OSA 13.  GERD 14.  Currently no DVT prophylaxis 15.  Gout 16.  BPH  Plan:  Await path and final determination of plans by Oncology and Dr. Excell Seltzer.     LOS: 6 days    Argie Applegate 03/07/2015

## 2015-03-08 LAB — CBC
HCT: 30.1 % — ABNORMAL LOW (ref 39.0–52.0)
Hemoglobin: 9.4 g/dL — ABNORMAL LOW (ref 13.0–17.0)
MCH: 27.6 pg (ref 26.0–34.0)
MCHC: 31.2 g/dL (ref 30.0–36.0)
MCV: 88.5 fL (ref 78.0–100.0)
PLATELETS: 331 10*3/uL (ref 150–400)
RBC: 3.4 MIL/uL — AB (ref 4.22–5.81)
RDW: 20.6 % — ABNORMAL HIGH (ref 11.5–15.5)
WBC: 8.3 10*3/uL (ref 4.0–10.5)

## 2015-03-08 LAB — TYPE AND SCREEN
ABO/RH(D): A NEG
Antibody Screen: NEGATIVE

## 2015-03-08 LAB — BASIC METABOLIC PANEL
Anion gap: 10 (ref 5–15)
BUN: 16 mg/dL (ref 6–23)
CALCIUM: 8.8 mg/dL (ref 8.4–10.5)
CO2: 26 mmol/L (ref 19–32)
CREATININE: 1.03 mg/dL (ref 0.50–1.35)
Chloride: 100 mmol/L (ref 96–112)
GFR calc Af Amer: 75 mL/min — ABNORMAL LOW (ref >90)
GFR, EST NON AFRICAN AMERICAN: 65 mL/min — AB (ref >90)
Glucose, Bld: 101 mg/dL — ABNORMAL HIGH (ref 70–99)
Potassium: 4.3 mmol/L (ref 3.5–5.1)
Sodium: 136 mmol/L (ref 135–145)

## 2015-03-08 LAB — PROTIME-INR
INR: 1.29 (ref 0.00–1.49)
PROTHROMBIN TIME: 16.2 s — AB (ref 11.6–15.2)

## 2015-03-08 LAB — APTT: aPTT: 39 seconds — ABNORMAL HIGH (ref 24–37)

## 2015-03-08 NOTE — Progress Notes (Signed)
[facility] PROGRESS NOTE  James [name] DQQ:[id number] DOB: 02/01/1928 [name]: 03/01/2015 PCP: [name], MD  Assessment/Plan: 1. Duodenal mass/poorly differentiated high-grade carcinoma -CT scan of abdomen and pelvis performed on 03/01/2015 showing bulky duodenal mass wasn't thickened adjacent right upper quadrant lymphadenopathy. Also noted were hepatic metastatic disease, retroperitoneal lymphadenopathy and portal vein thrombosis. Possible metastatic disease involving left iliopsoas muscle. -Patient undergoing EGD on 03/03/2015 that showed fungating soft and friable mass in the duodenal bulb with significant luminal narrowing, multiple biopsies were taken. 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 surgery has been consulted, plan for surgical gastrojejunostomy, as it was felt that gastric outlet obstruction would not be quickly reversed with chemoradiation therapy -Plan for surgical intervention in a.m.  2.  Coronary artery disease. -Patient with history of coronary artery disease, status post cardiac catheterization on 06/22/2014 undergoing percutaneous intervention to mid LAD lesion with a drug-eluting stent. -Patient has been on antiplatelet therapy for 8 months -Plavix held for upcoming surgical procedure. -Cardiology consulted, plan to restart aspirin therapy post surgery   3.  Chronic diastolic congestive heart failure -Last transthoracic echocardiogram performed 12/08/2013 that showed preserved ejection fraction of 60-65% -Patient does not have clinical evidence of fluid overload, on IV fluids will continue to monitor.  4.  Urinary tract infection. -Urine culture from 03/02/2015 showing no growth -Will stop IV Levaquin  5.  Hypothyroidism. -Continue Synthroid  Code Status: Full code Family Communication:  Disposition Plan: Plan for surgery in a.m.   Consultants:  General surgery  Medical  oncology  GI  Procedures:  EGD   HPI/Subjective: James [name] is a pleasant 81 yo male with history of coronary artery disease, diastolic heart failure, chronic anemia due to chronic blood loss, portal vein thrombosis, urothelial bladder CA(biopsy 02/06/15), who presented with excruciating right low quadrant pain and was found to have new fungating duodenal mass as well as large hepatic masses. Patient was on Plavix for history of drug eluded stent placed in July, but this was held at the time of admission in anticipation of surgical procedures. GI perfomed EGD with biopsy on 03/03/15. Incidentally, patient was found to have hypothyroidism(tsh 12.2). Free T4 is normal. Patient was started on low dose Synthroid. He was also found to have UTI. He seemed to have some confusion-?due to UTI but this has cleared up today ?if recurrence, worry about brain mets. Urine cultures negative. Will continue Levaquin and await duodenal mass biopsy results. Keep holding Plavix as patient may need more invasive procedures. Will defer to GI/Oncology/IR/Surgery regarding further work up. Patient has tolerated food better overnight. However, there is concern that he may retaining food in the stomach because of duodenal mass. Agree with slowing down feeding depending on patient's tolerance. I encouraged him to take small meals and ambulate.   Objective: Filed Vitals:   03/08/15 0554  BP: 100/43  Pulse: 53  Temp: 97.9 F (36.6 C)  Resp: 18    Intake/Output Summary (Last 24 hours) at 03/08/15 1357 Last data filed at 03/08/15 0959  Gross per 24 hour  Intake    360 ml  Output      0 ml  Net    360 ml   Filed Weights   03/04/15 1338 03/05/15 0834 03/07/15 2238  Weight: 107.3 kg (236 lb 8.9 oz) 104.781 kg (231 lb) 103.5 kg (228 lb 2.8 oz)    Exam:   General:  Patient is in no acute distress, he is awake and alert oriented  Cardiovascular: Regular rate and rhythm normal S1-S2  Respiratory: Normal  respiratory effort, lungs are clear to auscultation bilaterally no wheezing rhonchi or rales  Abdomen: Patient having pain to palpation over right lower and right upper quadrant, no rebound tenderness or guarding  Musculoskeletal: He has 1-2 less bilateral extremity pitting edema  Data Reviewed: Basic Metabolic Panel:  Recent Labs Lab 03/02/15 0507 03/03/15 0535 03/04/15 0507 03/05/15 0515 03/06/15 0440 03/08/15 0510  NA 138 137 133* 135 136 136  K 3.9 4.4 4.7 4.7 4.6 4.3  CL 102 102 100 100 101 100  CO2 26 26 26 25 28 26   GLUCOSE 96 116* 125* 115* 107* 101*  BUN 22 19 15 16 18 16   CREATININE 1.19 1.04 1.03 1.02 0.99 1.03  CALCIUM 9.0 9.1 8.8 8.7 8.9 8.8  MG 2.0  --   --   --   --   --   PHOS 4.8*  --   --   --   --   --    Liver Function Tests:  Recent Labs Lab 03/01/15 1500 03/02/15 0507 03/03/15 0535 03/04/15 0507  AST 25 26 24 22   ALT 37 33 33 32  ALKPHOS 140* 122* 124* 121*  BILITOT 0.8 0.9 0.4 0.5  PROT 7.4 5.9* 6.2 5.9*  ALBUMIN 3.7 3.0* 3.0* 2.8*    Recent Labs Lab 03/01/15 1500  LIPASE 21    Recent Labs Lab 03/04/15 0507  AMMONIA 31   CBC:  Recent Labs Lab 03/01/15 1500  03/03/15 0535 03/04/15 0507 03/05/15 0515 03/06/15 0440 03/08/15 0510  WBC 10.0  < > 9.4 9.7 8.1 7.4 8.3  NEUTROABS 7.3  --  7.2 8.3*  --   --   --   HGB 10.5*  < > 9.5* 9.6* 9.2* 9.0* 9.4*  HCT 34.0*  < > 30.6* 31.3* 29.8* 28.9* 30.1*  MCV 87.4  < > 87.7 88.9 88.4 89.2 88.5  PLT 382  < > 313 305 316 281 331  < > = values in this interval not displayed. Cardiac Enzymes: No results for input(s): CKTOTAL, CKMB, CKMBINDEX, TROPONINI in the last 168 hours. BNP (last 3 results) No results for input(s): BNP in the last 8760 hours.  ProBNP (last 3 results)  Recent Labs  05/29/14 1910 06/06/14 1244 11/29/14 0944  PROBNP 36.3 83.0 114.6    CBG: No results for input(s): GLUCAP in the last 168 hours.  Recent Results (from the past 240 hour(s))  Culture, Urine      Status: None   Collection Time: 03/02/15  8:40 PM  Result Value Ref Range Status   Specimen Description URINE, CLEAN CATCH  Final   Special Requests NONE  Final   Colony Count NO GROWTH Performed at [facility]   Final   Culture NO GROWTH Performed at [facility]   Final   Report Status 03/04/2015 FINAL  Final  Surgical pcr screen     Status: None   Collection Time: 03/07/15  2:20 PM  Result Value Ref Range Status   MRSA, PCR NEGATIVE NEGATIVE Final   Staphylococcus aureus NEGATIVE NEGATIVE Final    Comment:        The Xpert SA Assay (FDA approved for NASAL specimens in patients over 87 years of age), is one component of a comprehensive surveillance program.  Test performance has been validated by [facility] for patients greater than or equal to 37 year old. It is not intended to diagnose infection nor to guide  or monitor treatment.      Studies: No results found.  Scheduled Meds: . allopurinol  150 mg Oral Daily  . antiseptic oral rinse  7 mL Mouth Rinse q12n4p  . cefoTEtan (CEFOTAN) IV  2 g Intravenous On Call to OR  . chlorhexidine  15 mL Mouth Rinse BID  . feeding supplement (ENSURE COMPLETE)  237 mL Oral BID BM  . finasteride  5 mg Oral Daily  . iron polysaccharides  150 mg Oral BID  . levofloxacin (LEVAQUIN) IV  750 mg Intravenous Daily  . levothyroxine  25 mcg Oral QAC breakfast  . morphine  15 mg Oral Q12H  . ondansetron  4 mg Intravenous Q6H  . pantoprazole (PROTONIX) IV  40 mg Intravenous Q24H  . pravastatin  80 mg Oral q1800   Continuous Infusions: . dextrose 5 % and 0.45 % NaCl with KCl 20 mEq/L 75 mL/hr at 03/05/15 1841    Active Problems:   HYPERTENSION, BENIGN   CAD, NATIVE VESSEL   Chronic diastolic CHF (congestive heart failure)   Portal vein thrombosis   Anemia due to chronic blood loss   Duodenal mass   Abdominal pain   Hepatic metastases   Liver mass   Cancer related pain   CHF (congestive heart failure)    Protein-calorie malnutrition, severe   Hypothyroidism   UTI (urinary tract infection)   Duodenal obstruction, acquired   History of DVT of lower extremity    Time spent: 35 minutes    Kelvin Cellar  [facility] Pager [phone]. If 7PM-7AM, please contact night-coverage at [email or web address], password [facility] 03/08/2015, 1:57 PM  LOS: 7 days

## 2015-03-08 NOTE — Progress Notes (Signed)
5 Days Post-Op  Subjective: No complaints, we had to put off his surgery for an emergency that will be complicated.    Objective: Vital signs in last 24 hours: Temp:  [97.4 F (36.3 C)-97.9 F (36.6 C)] 97.9 F (36.6 C) (03/16 0554) Pulse Rate:  [53-65] 53 (03/16 0554) Resp:  [18-20] 18 (03/16 0554) BP: (100-120)/(43-53) 100/43 mmHg (03/16 0554) SpO2:  [97 %] 97 % (03/16 0554) Weight:  [103.5 kg (228 lb 2.8 oz)] 103.5 kg (228 lb 2.8 oz) (03/15 2238) Last BM Date: 03/05/15  Intake/Output from previous day: 03/15 0701 - 03/16 0700 In: 480 [P.O.:480] Out: -  Intake/Output this shift: Total I/O In: 360 [P.O.:360] Out: -   General appearance: alert, cooperative and no distress GI: soft, he remains tender on exam right side upper abdomen with palpation.  No pain lying still.  + BS.  Lab Results:   Recent Labs  03/06/15 0440 03/08/15 0510  WBC 7.4 8.3  HGB 9.0* 9.4*  HCT 28.9* 30.1*  PLT 281 331    BMET  Recent Labs  03/06/15 0440 03/08/15 0510  NA 136 136  K 4.6 4.3  CL 101 100  CO2 28 26  GLUCOSE 107* 101*  BUN 18 16  CREATININE 0.99 1.03  CALCIUM 8.9 8.8   PT/INR  Recent Labs  03/08/15 0510  LABPROT 16.2*  INR 1.29     Recent Labs Lab 03/01/15 1500 03/02/15 0507 03/03/15 0535 03/04/15 0507  AST 25 26 24 22   ALT 37 33 33 32  ALKPHOS 140* 122* 124* 121*  BILITOT 0.8 0.9 0.4 0.5  PROT 7.4 5.9* 6.2 5.9*  ALBUMIN 3.7 3.0* 3.0* 2.8*     Lipase     Component Value Date/Time   LIPASE 21 03/01/2015 1500     Studies/Results: No results found.  Medications: . allopurinol  150 mg Oral Daily  . antiseptic oral rinse  7 mL Mouth Rinse q12n4p  . cefoTEtan (CEFOTAN) IV  2 g Intravenous On Call to OR  . chlorhexidine  15 mL Mouth Rinse BID  . feeding supplement (ENSURE COMPLETE)  237 mL Oral BID BM  . finasteride  5 mg Oral Daily  . iron polysaccharides  150 mg Oral BID  . levofloxacin (LEVAQUIN) IV  750 mg Intravenous Daily  .  levothyroxine  25 mcg Oral QAC breakfast  . morphine  15 mg Oral Q12H  . ondansetron  4 mg Intravenous Q6H  . pantoprazole (PROTONIX) IV  40 mg Intravenous Q24H  . pravastatin  80 mg Oral q1800   . dextrose 5 % and 0.45 % NaCl with KCl 20 mEq/L 75 mL/hr at 03/05/15 1841    Assessment/Plan 1. Duodenal mass with liver metastasis, with new gastric outlet obstruction S/p EGD with biopsy 03/03/15 Dr. Zenovia Jarred Path pending 2. History of Melanoma 3. Papillary urothelial cancer  4. CAD/ hx of DES/Aortic stenosis/hx of chronic diastolic CHF (last stress test 06/18/15 shows low risk study EF 74%) CATH 06/22/14: 60-70%LAD, LCIRC aneurysmal, 30% CJRC RCA small. Dr. Loralie Champagne. 5. ITP - thought to be due to septra 6. PORTAL VEIN THROMBOSIS 7. Hypertension 8. Hx of peripheral neuropathy, positional Vertigo 9. Anemia/recurrent GI bleed 10. PCM 11. UTI 12. Hx of OSA 13. GERD 14. Currently no DVT prophylaxis 15. Gout 16. BPH  17.  SCD for DVT prophylaxis  Plan:  Surgery tomorrow.  Working on time now.    LOS: 7 days    Jayline Kilburg 03/08/2015

## 2015-03-09 ENCOUNTER — Inpatient Hospital Stay (HOSPITAL_COMMUNITY): Payer: Medicare Other | Admitting: Certified Registered"

## 2015-03-09 ENCOUNTER — Encounter (HOSPITAL_COMMUNITY)
Admission: EM | Disposition: A | Discharge status: Home Health | Payer: Self-pay | Source: Home / Self Care | Attending: Internal Medicine

## 2015-03-09 ENCOUNTER — Encounter (HOSPITAL_COMMUNITY): Payer: Self-pay | Admitting: Certified Registered"

## 2015-03-09 DIAGNOSIS — I509 Heart failure, unspecified: Secondary | ICD-10-CM

## 2015-03-09 HISTORY — PX: GASTROJEJUNOSTOMY: SHX1697

## 2015-03-09 SURGERY — GASTROJEJUNOSTOMY, LAPAROSCOPIC
Anesthesia: General | Site: Abdomen

## 2015-03-09 MED ORDER — LACTATED RINGERS IV SOLN
INTRAVENOUS | Status: DC
  Administered 2015-03-09: 1000 mL via INTRAVENOUS

## 2015-03-09 MED ORDER — PROPOFOL 10 MG/ML IV BOLUS
INTRAVENOUS | Status: DC | PRN
  Administered 2015-03-09: 150 mg via INTRAVENOUS

## 2015-03-09 MED ORDER — TISSEEL VH 10 ML EX KIT
PACK | CUTANEOUS | Status: AC
  Filled 2015-03-09: qty 1

## 2015-03-09 MED ORDER — LACTATED RINGERS IV SOLN
INTRAVENOUS | Status: DC | PRN
Start: 2015-03-09 — End: 2015-03-09
  Administered 2015-03-09: 10:00:00 via INTRAVENOUS

## 2015-03-09 MED ORDER — PROMETHAZINE HCL 25 MG/ML IJ SOLN: 6.2500 mg | INTRAMUSCULAR | Status: DC | PRN

## 2015-03-09 MED ORDER — KCL IN DEXTROSE-NACL 20-5-0.45 MEQ/L-%-% IV SOLN
INTRAVENOUS | Status: DC
  Administered 2015-03-09: 100 mL/h via INTRAVENOUS
  Administered 2015-03-12: 02:00:00 via INTRAVENOUS
  Filled 2015-03-09 (×7): qty 1000

## 2015-03-09 MED ORDER — FENTANYL CITRATE 0.05 MG/ML IJ SOLN
INTRAMUSCULAR | Status: DC | PRN
  Administered 2015-03-09: 100 ug via INTRAVENOUS
  Administered 2015-03-09 (×3): 50 ug via INTRAVENOUS

## 2015-03-09 MED ORDER — MEPERIDINE HCL 50 MG/ML IJ SOLN: 6.2500 mg | INTRAMUSCULAR | Status: DC | PRN

## 2015-03-09 MED ORDER — LACTATED RINGERS IR SOLN
Status: DC | PRN
  Administered 2015-03-09: 1

## 2015-03-09 MED ORDER — GLYCOPYRROLATE 0.2 MG/ML IJ SOLN
INTRAMUSCULAR | Status: DC | PRN
  Administered 2015-03-09: 0.6 mg via INTRAVENOUS

## 2015-03-09 MED ORDER — NEOSTIGMINE METHYLSULFATE 10 MG/10ML IV SOLN
INTRAVENOUS | Status: AC
  Filled 2015-03-09: qty 1

## 2015-03-09 MED ORDER — FENTANYL CITRATE 0.05 MG/ML IJ SOLN: 25.0000 ug | INTRAMUSCULAR | Status: DC | PRN

## 2015-03-09 MED ORDER — GLYCOPYRROLATE 0.2 MG/ML IJ SOLN
INTRAMUSCULAR | Status: AC
  Filled 2015-03-09: qty 3

## 2015-03-09 MED ORDER — ONDANSETRON HCL 4 MG/2ML IJ SOLN
INTRAMUSCULAR | Status: AC
  Filled 2015-03-09: qty 2

## 2015-03-09 MED ORDER — DEXAMETHASONE SODIUM PHOSPHATE 10 MG/ML IJ SOLN
INTRAMUSCULAR | Status: AC
  Filled 2015-03-09: qty 1

## 2015-03-09 MED ORDER — NEOSTIGMINE METHYLSULFATE 10 MG/10ML IV SOLN
INTRAVENOUS | Status: DC | PRN
Start: 2015-03-09 — End: 2015-03-09
  Administered 2015-03-09: 4 mg via INTRAVENOUS

## 2015-03-09 MED ORDER — ROCURONIUM BROMIDE 100 MG/10ML IV SOLN
INTRAVENOUS | Status: DC | PRN
  Administered 2015-03-09: 30 mg via INTRAVENOUS
  Administered 2015-03-09: 10 mg via INTRAVENOUS

## 2015-03-09 MED ORDER — ROCURONIUM BROMIDE 100 MG/10ML IV SOLN
INTRAVENOUS | Status: AC
  Filled 2015-03-09: qty 1

## 2015-03-09 MED ORDER — SUCCINYLCHOLINE CHLORIDE 20 MG/ML IJ SOLN
INTRAMUSCULAR | Status: DC | PRN
  Administered 2015-03-09: 100 mg via INTRAVENOUS

## 2015-03-09 MED ORDER — TISSEEL VH 10 ML EX KIT
PACK | CUTANEOUS | Status: DC | PRN
  Administered 2015-03-09: 10 mL

## 2015-03-09 MED ORDER — CEFOTETAN DISODIUM-DEXTROSE 2-2.08 GM-% IV SOLR
INTRAVENOUS | Status: AC
  Filled 2015-03-09: qty 50

## 2015-03-09 MED ORDER — LIDOCAINE HCL (PF) 2 % IJ SOLN
INTRAMUSCULAR | Status: DC | PRN
  Administered 2015-03-09: 40 mg via INTRADERMAL

## 2015-03-09 MED ORDER — 0.9 % SODIUM CHLORIDE (POUR BTL) OPTIME
TOPICAL | Status: DC | PRN
  Administered 2015-03-09: 1000 mL

## 2015-03-09 MED ORDER — FENTANYL CITRATE 0.05 MG/ML IJ SOLN
INTRAMUSCULAR | Status: AC
  Filled 2015-03-09: qty 5

## 2015-03-09 MED ORDER — PROPOFOL 10 MG/ML IV BOLUS
INTRAVENOUS | Status: AC
  Filled 2015-03-09: qty 20

## 2015-03-09 MED ORDER — BUPIVACAINE-EPINEPHRINE (PF) 0.25% -1:200000 IJ SOLN
INTRAMUSCULAR | Status: DC | PRN
  Administered 2015-03-09: 30 mL

## 2015-03-09 MED ORDER — ONDANSETRON HCL 4 MG/2ML IJ SOLN
INTRAMUSCULAR | Status: DC | PRN
  Administered 2015-03-09: 4 mg via INTRAVENOUS

## 2015-03-09 MED ORDER — HYDROMORPHONE HCL 1 MG/ML IJ SOLN
INTRAMUSCULAR | Status: DC | PRN
  Administered 2015-03-09 (×2): 1 mg via INTRAVENOUS

## 2015-03-09 MED ORDER — HYDROMORPHONE HCL 2 MG/ML IJ SOLN
INTRAMUSCULAR | Status: AC
  Filled 2015-03-09: qty 1

## 2015-03-09 MED ORDER — DEXAMETHASONE SODIUM PHOSPHATE 10 MG/ML IJ SOLN
INTRAMUSCULAR | Status: DC | PRN
  Administered 2015-03-09: 10 mg via INTRAVENOUS

## 2015-03-09 MED ORDER — CEFOTETAN DISODIUM 2 G IJ SOLR
2.0000 g | INTRAMUSCULAR | Status: DC | PRN
  Administered 2015-03-09: 2 g via INTRAVENOUS

## 2015-03-09 MED ORDER — BUPIVACAINE-EPINEPHRINE (PF) 0.25% -1:200000 IJ SOLN
INTRAMUSCULAR | Status: AC
  Filled 2015-03-09: qty 30

## 2015-03-09 SURGICAL SUPPLY — 54 items
APPLIER CLIP ROT 10 11.4 M/L (STAPLE)
BLADE HEX COATED 2.75 (ELECTRODE) ×3 IMPLANT
CLAMP ENDO BABCK 10MM (STAPLE) IMPLANT
CLIP APPLIE ROT 10 11.4 M/L (STAPLE) IMPLANT
CLIP SUT LAPRA TY ABSORB (SUTURE) ×3 IMPLANT
COVER MAYO STAND STRL (DRAPES) ×3 IMPLANT
DRAPE LAPAROSCOPIC ABDOMINAL (DRAPES) ×3 IMPLANT
DRAPE WARM FLUID 44X44 (DRAPE) ×3 IMPLANT
DUPLOJECT EASY PREP 4ML (MISCELLANEOUS) ×3 IMPLANT
ELECT REM PT RETURN 9FT ADLT (ELECTROSURGICAL) ×3
ELECTRODE REM PT RTRN 9FT ADLT (ELECTROSURGICAL) ×1 IMPLANT
ENDOLOOP SUT PDS II  0 18 (SUTURE)
ENDOLOOP SUT PDS II 0 18 (SUTURE) IMPLANT
GAUZE SPONGE 4X4 12PLY STRL (GAUZE/BANDAGES/DRESSINGS) ×3 IMPLANT
GLOVE BIOGEL PI IND STRL 7.0 (GLOVE) ×1 IMPLANT
GLOVE BIOGEL PI INDICATOR 7.0 (GLOVE) ×2
GOWN STRL REUS W/TWL LRG LVL3 (GOWN DISPOSABLE) ×3 IMPLANT
GOWN STRL REUS W/TWL XL LVL3 (GOWN DISPOSABLE) ×6 IMPLANT
KIT BASIN OR (CUSTOM PROCEDURE TRAY) ×3 IMPLANT
LIGASURE IMPACT 36 18CM CVD LR (INSTRUMENTS) IMPLANT
LIQUID BAND (GAUZE/BANDAGES/DRESSINGS) IMPLANT
MARKER SKIN DUAL TIP RULER LAB (MISCELLANEOUS) IMPLANT
NS IRRIG 1000ML POUR BTL (IV SOLUTION) ×6 IMPLANT
PENCIL BUTTON HOLSTER BLD 10FT (ELECTRODE) ×3 IMPLANT
POUCH SPECIMEN RETRIEVAL 10MM (ENDOMECHANICALS) IMPLANT
RELOAD STAPLER BLUE 60MM (STAPLE) ×1 IMPLANT
SCISSORS LAP 5X35 DISP (ENDOMECHANICALS) ×3 IMPLANT
SET IRRIG TUBING LAPAROSCOPIC (IRRIGATION / IRRIGATOR) ×3 IMPLANT
SHEARS HARMONIC ACE PLUS 36CM (ENDOMECHANICALS) IMPLANT
SLEEVE ADV FIXATION 12X100MM (TROCAR) ×3 IMPLANT
SLEEVE ADV FIXATION 5X100MM (TROCAR) ×6 IMPLANT
SPONGE LAP 18X18 X RAY DECT (DISPOSABLE) ×3 IMPLANT
STAPLER ECHELON LONG 60 440 (INSTRUMENTS) ×3 IMPLANT
STAPLER RELOAD BLUE 60MM (STAPLE) ×3
STAPLER VISISTAT 35W (STAPLE) ×3 IMPLANT
SUT DVC VICRYL PGA 2.0X39 (SUTURE) ×9 IMPLANT
SUT MNCRL AB 4-0 PS2 18 (SUTURE) IMPLANT
SUT SILK 2 0 (SUTURE) ×2
SUT SILK 2 0 SH CR/8 (SUTURE) ×3 IMPLANT
SUT SILK 2-0 18XBRD TIE 12 (SUTURE) ×1 IMPLANT
SUT SILK 3 0 (SUTURE)
SUT SILK 3 0 SH CR/8 (SUTURE) IMPLANT
SUT SILK 3-0 18XBRD TIE 12 (SUTURE) IMPLANT
SUT VIC AB 2-0 SH 27 (SUTURE) ×2
SUT VIC AB 2-0 SH 27X BRD (SUTURE) ×1 IMPLANT
TOWEL OR 17X26 10 PK STRL BLUE (TOWEL DISPOSABLE) ×6 IMPLANT
TRAY FOLEY CATH 14FRSI W/METER (CATHETERS) ×3 IMPLANT
TRAY LAPAROSCOPIC (CUSTOM PROCEDURE TRAY) ×3 IMPLANT
TROCAR BLADELESS OPT 5 75 (ENDOMECHANICALS) ×3 IMPLANT
TROCAR XCEL BLUNT TIP 100MML (ENDOMECHANICALS) IMPLANT
TROCAR XCEL NON-BLD 11X100MML (ENDOMECHANICALS) IMPLANT
TROCAR XCEL UNIV SLVE 11M 100M (ENDOMECHANICALS) IMPLANT
TUBING INSUFFLATION 10FT LAP (TUBING) ×3 IMPLANT
YANKAUER SUCT BULB TIP 10FT TU (MISCELLANEOUS) ×3 IMPLANT

## 2015-03-09 NOTE — Progress Notes (Signed)
[facility] PROGRESS NOTE  James [name] QMV:[id number] DOB: 12/17/1928 [name]: 03/01/2015 PCP: [name], MD  Assessment/Plan: 1. Duodenal mass/poorly differentiated high-grade carcinoma -CT scan of abdomen and pelvis performed on 03/01/2015 showing bulky duodenal mass wasn't thickened adjacent right upper quadrant lymphadenopathy. Also noted were hepatic metastatic disease, retroperitoneal lymphadenopathy and portal vein thrombosis. Possible metastatic disease involving left iliopsoas muscle. -Patient undergoing EGD on 03/03/2015 that showed fungating soft and friable mass in the duodenal bulb with significant luminal narrowing, multiple biopsies were taken. 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 surgery performing gastro-jejunostomy today, he tolerated procedure well, there where no complications.  -Surgery recommending NPO status until tomorrow.    2.  Coronary artery disease. -Patient with history of coronary artery disease, status post cardiac catheterization on 06/22/2014 undergoing percutaneous intervention to mid LAD lesion with a drug-eluting stent. -Patient has been on antiplatelet therapy for 8 months -Plavix held for upcoming surgical procedure. -Cardiology consulted, plan to restart aspirin therapy post surgery   3.  Chronic diastolic congestive heart failure -Last transthoracic echocardiogram performed 12/08/2013 that showed preserved ejection fraction of 60-65% -Patient does not have clinical evidence of fluid overload, on IV fluids will continue to monitor.  4.  Urinary tract infection. -Urine culture from 03/02/2015 showing no growth -IV Levaquin stopped on 03/08/2015  5.  Hypothyroidism. 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 Patient was on Plavix for history of drug eluded stent placed in July, but this was held at the time of admission in anticipation of surgical procedures. GI perfomed EGD with biopsy on 03/03/15. Incidentally, patient was found to have hypothyroidism(tsh 12.2). Free T4 is normal. Patient was started on low dose Synthroid. He was also found to have UTI. He seemed to have some confusion-?due to UTI but this has cleared up today ?if recurrence, worry about brain mets. Urine cultures negative. Will continue Levaquin and await duodenal mass biopsy results. Keep holding Plavix as patient may need more invasive procedures. Will defer to GI/Oncology/IR/Surgery regarding further work up. Patient has tolerated food better overnight. However, there is concern that he may retaining food in the stomach because of duodenal mass.   Objective: Filed Vitals:   03/09/15 1446  BP: 119/58  Pulse: 78  Temp: 97.5 F (36.4 C)  Resp: 14    Intake/Output Summary (Last 24 hours) at 03/09/15 1720 Last data filed at 03/09/15 1230  Gross per 24 hour  Intake 5367.5 ml  Output    680 ml  Net 4687.5 ml   Filed Weights   03/04/15 1338 03/05/15 0834 03/07/15 2238  Weight: 107.3 kg (236 lb 8.9 oz) 104.781 kg (231 lb) 103.5 kg (228 lb 2.8 oz)    Exam:   General:  Patient is in no acute distress, he is awake and alert oriented  Cardiovascular: Regular rate and rhythm normal S1-S2  Respiratory: Normal respiratory effort, lungs are clear to auscultation bilaterally no wheezing rhonchi or rales  Abdomen: Incision sites  appear clean, there was mild tenderness to palpation across abdomen. No peritoneal signs.   Musculoskeletal: He has 1-2 less bilateral extremity pitting edema  Data Reviewed: Basic Metabolic Panel:  Recent Labs Lab 03/03/15 0535 03/04/15 0507 03/05/15 0515 03/06/15 0440 03/08/15 0510  NA 137 133* 135 136 136  K 4.4 4.7 4.7 4.6 4.3  CL 102 100 100 101 100  CO2 26 26 25 28 26   GLUCOSE 116* 125* 115* 107* 101*  BUN 19 15 16 18 16   CREATININE 1.04 1.03 1.02 0.99 1.03  CALCIUM 9.1 8.8 8.7 8.9 8.8   Liver Function Tests:  Recent Labs Lab 03/03/15 0535 03/04/15 0507  AST 24 22  ALT 33 32  ALKPHOS 124* 121*  BILITOT 0.4 0.5  PROT 6.2 5.9*  ALBUMIN 3.0* 2.8*   No results for input(s): LIPASE, AMYLASE in the last 168 hours.  Recent Labs Lab 03/04/15 0507  AMMONIA 31   CBC:  Recent Labs Lab 03/03/15 0535 03/04/15 0507 03/05/15 0515 03/06/15 0440 03/08/15 0510  WBC 9.4 9.7 8.1 7.4 8.3  NEUTROABS 7.2 8.3*  --   --   --   HGB 9.5* 9.6* 9.2* 9.0* 9.4*  HCT 30.6* 31.3* 29.8* 28.9* 30.1*  MCV 87.7 88.9 88.4 89.2 88.5  PLT 313 305 316 281 331   Cardiac Enzymes: No results for input(s): CKTOTAL, CKMB, CKMBINDEX, TROPONINI in the last 168 hours. BNP (last 3 results) No results for input(s): BNP in the last 8760 hours.  ProBNP (last 3 results)  Recent Labs  05/29/14 1910 06/06/14 1244 11/29/14 0944  PROBNP 36.3 83.0 114.6    CBG: No results for input(s): GLUCAP in the last 168 hours.  Recent Results (from the past 240 hour(s))  Culture, Urine     Status: None   Collection Time: 03/02/15  8:40 PM  Result Value Ref Range Status   Specimen Description URINE, CLEAN CATCH  Final   Special Requests NONE  Final   Colony Count NO GROWTH Performed at [facility]   Final   Culture NO GROWTH Performed at [facility]   Final   Report Status 03/04/2015 FINAL  Final  Surgical pcr screen     Status: None   Collection Time: 03/07/15  2:20 PM   Result Value Ref Range Status   MRSA, PCR NEGATIVE NEGATIVE Final   Staphylococcus aureus NEGATIVE NEGATIVE Final    Comment:        The Xpert SA Assay (FDA approved for NASAL specimens in patients over 39 years of age), is one component of a comprehensive surveillance program.  Test performance has been validated by [facility] for patients greater than or equal to 31 year old. It is not intended to diagnose infection nor to guide or monitor treatment.      Studies: No results found.  Scheduled Meds: . allopurinol  150 mg Oral Daily  . antiseptic oral rinse  7 mL Mouth Rinse q12n4p  . chlorhexidine  15 mL Mouth Rinse BID  . feeding supplement (ENSURE COMPLETE)  237 mL Oral BID BM  . finasteride  5 mg Oral Daily  . iron polysaccharides  150 mg Oral BID  . levothyroxine  25 mcg Oral QAC breakfast  . morphine  15 mg Oral Q12H  . ondansetron  4 mg Intravenous Q6H  . pantoprazole (PROTONIX) IV  40 mg Intravenous Q24H  . pravastatin  80 mg Oral q1800   Continuous Infusions: . dextrose 5 % and 0.45 % NaCl  with KCl 20 mEq/L      Active Problems:   HYPERTENSION, BENIGN   CAD, NATIVE VESSEL   Chronic diastolic CHF (congestive heart failure)   Portal vein thrombosis   Anemia due to chronic blood loss   Duodenal mass   Abdominal pain   Hepatic metastases   Liver mass   Cancer related pain   CHF (congestive heart failure)   Protein-calorie malnutrition, severe   Hypothyroidism   UTI (urinary tract infection)   Duodenal obstruction, acquired   History of DVT of lower extremity    Time spent: 35 minutes    Kelvin Cellar  [facility] Pager [phone]. If 7PM-7AM, please contact night-coverage at [email or web address], password [facility] 03/09/2015, 5:20 PM  LOS: 8 days

## 2015-03-09 NOTE — Progress Notes (Signed)
PT Cancellation Note  Patient Details Name: ANSAR SKODA MRN: [id number] DOB: 06/17/1928   Cancelled Treatment:    Reason Eval/Treat Not Completed: Patient at procedure or test/unavailable--surgery today. Will check back another day to attempt eval. Thanks.    Weston Anna, MPT Pager: [phone]

## 2015-03-09 NOTE — Progress Notes (Signed)
  Echocardiogram 2D Echocardiogram has been performed.  Darlina Sicilian M 03/09/2015, 2:13 PM

## 2015-03-09 NOTE — Progress Notes (Signed)
NUTRITION FOLLOW UP  Intervention:   - Once diet advanced, continue Ensure Enlive po BID, each supplement provides 350 kcal and 20 grams of protein - Once diet advanced, continue Magic cup BID (pt prefers orange cream) with meals, each supplement provides 290 kcal and 9 grams of protein - RD will continue to monitor.   Nutrition Dx:   Inadequate oral intake related to inability to eat as evidenced by NPO  Goal:   Pt to meet >/= 90% of their estimated nutrition needs   Monitor:   Weight trend, NPO, diet advancement, labs  Assessment:   81 year old male presenting with right-sided abdominal pain found to have a duodenal mass hepatic metastases. Question lymphoma versus duodenal cancer. Patient will need a biopsy for further delineation will likely need liver biopsy as well.   3/10: - Pt with 34 lb wt loss in the past 3 months (significant for time frame.) - Pt reports a loss of appetite for the past few months. He finds it hard to eat more than a little at a time. Pt given ideas on ways to increase calories and protein in diet. Will order nutritional supplements according to pt preference.  - Pt with no fat or muscle wasting  3/17: - Pt s/p laparoscopic gastrojejunostomy. Pt still NPO from surgery.  - Per family, pt was eating Magic Cups and drinking Ensure prior to NPO. Pt prefers orange cream and says that vanilla flavor is too sweet.  - Will re-order nutritional supplements once diet advanced.  - Labs reviewed  Height: Ht Readings from Last 1 Encounters:  03/01/15 5' 11.5" (1.816 m)    Weight Status:   Wt Readings from Last 1 Encounters:  03/07/15 228 lb 2.8 oz (103.5 kg)    Re-estimated needs:  Kcal: 2200-2400 Protein: 115-130 g Fluid: 2.3 L/day  Skin: closed incision on abdomen  Diet Order: Diet NPO time specified   Intake/Output Summary (Last 24 hours) at 03/09/15 1315 Last data filed at 03/09/15 1230  Gross per 24 hour  Intake 5367.5 ml  Output    680 ml   Net 4687.5 ml    Last BM: 3/13   Labs:   Recent Labs Lab 03/05/15 0515 03/06/15 0440 03/08/15 0510  NA 135 136 136  K 4.7 4.6 4.3  CL 100 101 100  CO2 25 28 26   BUN 16 18 16   CREATININE 1.02 0.99 1.03  CALCIUM 8.7 8.9 8.8  GLUCOSE 115* 107* 101*    CBG (last 3)  No results for input(s): GLUCAP in the last 72 hours.  Scheduled Meds: . allopurinol  150 mg Oral Daily  . antiseptic oral rinse  7 mL Mouth Rinse q12n4p  . chlorhexidine  15 mL Mouth Rinse BID  . feeding supplement (ENSURE COMPLETE)  237 mL Oral BID BM  . finasteride  5 mg Oral Daily  . iron polysaccharides  150 mg Oral BID  . levothyroxine  25 mcg Oral QAC breakfast  . morphine  15 mg Oral Q12H  . ondansetron  4 mg Intravenous Q6H  . pantoprazole (PROTONIX) IV  40 mg Intravenous Q24H  . pravastatin  80 mg Oral q1800    Continuous Infusions: . dextrose 5 % and 0.45 % NaCl with KCl 20 mEq/L      Laurette Schimke [place], [place], [place]

## 2015-03-09 NOTE — Transfer of Care (Signed)
Immediate Anesthesia Transfer of Care Note  Patient: James [name]  Procedure(s) Performed: Procedure(s) (LRB): LAPAROSCOPIC GASTROJEJUNOSTOMY (N/A)  Patient Location: PACU  Anesthesia Type: General  Level of Consciousness: sedated, patient cooperative and responds to stimulation  Airway & Oxygen Therapy: Patient Spontanous Breathing and Patient connected to face mask oxgen  Post-op Assessment: Report given to PACU RN and Post -op Vital signs reviewed and stable  Post vital signs: Reviewed and stable  Complications: No apparent anesthesia complications

## 2015-03-09 NOTE — Anesthesia Postprocedure Evaluation (Signed)
  Anesthesia Post-op Note  Patient: James [name]  Procedure(s) Performed: Procedure(s) (LRB): LAPAROSCOPIC GASTROJEJUNOSTOMY (N/A)  Patient Location: PACU  Anesthesia Type: General  Level of Consciousness: awake and alert   Airway and Oxygen Therapy: Patient Spontanous Breathing  Post-op Pain: mild  Post-op Assessment: Post-op Vital signs reviewed, Patient's Cardiovascular Status Stable, Respiratory Function Stable, Patent Airway and No signs of Nausea or vomiting  Last Vitals:  Filed Vitals:   03/09/15 1446  BP: 119/58  Pulse: 78  Temp: 36.4 C  Resp: 14    Post-op Vital Signs: stable   Complications: No apparent anesthesia complications

## 2015-03-09 NOTE — Anesthesia Procedure Notes (Signed)
Procedure Name: Intubation Date/Time: 03/09/2015 9:57 AM Performed by: Lajuana Carry E Pre-anesthesia Checklist: Patient identified, Emergency Drugs available, Suction available and Patient being monitored Patient Re-evaluated:Patient Re-evaluated prior to inductionOxygen Delivery Method: Circle System Utilized Preoxygenation: Pre-oxygenation with 100% oxygen Intubation Type: IV induction Ventilation: Mask ventilation without difficulty Laryngoscope Size: Mac and 4 Grade View: Grade II Tube type: Oral Tube size: 7.5 mm Number of attempts: 1 Airway Equipment and Method: Stylet and Oral airway Placement Confirmation: ETT inserted through vocal cords under direct vision,  positive ETCO2 and breath sounds checked- equal and bilateral Secured at: 20 cm Tube secured with: Tape Dental Injury: Teeth and Oropharynx as per pre-operative assessment

## 2015-03-09 NOTE — Op Note (Signed)
Preoperative Diagnosis: Malignant duodenal obstruction  Postoprative Diagnosis: Same  Procedure: Procedure(s): LAPAROSCOPIC GASTROJEJUNOSTOMY   Surgeon: Edward Jolly   Assistants: Erroll Luna  Anesthesia:  General endotracheal anesthesia  Indications: Patient is an 81 year old male who presents with a large mass in the duodenum with local adenopathy in apparent liver metastasis. Biopsy has revealed high-grade carcinoma. He has significant gastric outlet obstruction and we have recommended proceeding with laparoscopic gastro-jejunostomy as a palliative procedure. I discussed the procedure in detail and its indications with the patient and his family as well as risks of anesthetic complications, cardiovascular complications, bleeding, infection, leakage and possible failure to improve his symptoms. They understand and agree to proceed.  Procedure Detail:  Patient was brought to the operating room, placed in the supine position on the operating table, and general endotracheal anesthesia induced. He received preoperative IV antibiotics. PAS were in place. The abdomen was widely sterilely prepped and draped. Patient timeout was performed and correct procedure verified. Access was obtained without difficulty with a 5 mm Optiview trocar in the left upper quadrant and pneumoperitoneum established. Under direct vision a 5 mm trocar was placed just to the left and above the umbilicus for the camera port and 2 12 mm trochars placed in the lateral right upper quadrant and mid right upper quadrant. There was an obvious mass in the area of the duodenum with some local inflammatory change. No evidence however of any peritoneal studding. The stomach was not particularly distended. I chose a portion along the mid greater curve for the gastrojejunostomy and the lesser omentum was taken down off of the greater curve with the Harmonic scalpel entering the lesser sac and freeing a good portion of the greater  curve of the stomach. We then located the ligament of Treitz and found a mobile portion of proximal jejunum that would come up easily over the colon to the stomach for the gastrojejunostomy. Initially a posterior row of running 2-0 Vicryl was placed between the jejunum and greater curve of the stomach. Enterotomies were then made in the stomach and jejunum with Harmonic scalpel and a long gastrojejunostomy created with a single firing of the 60 mm blue load echelon stapler. The staple line was intact and without bleeding. The common enterotomy was closed with running 2-0 Vicryl begun at either end of the enterotomy and tied centrally. An outer row of seromuscular 2-0 Vicryl was placed reinforcing the entire anterior staple and suture line. There was no bleeding or other problems. All CO2 was evacuated and trochars removed. Skin incisions were closed with subcuticular Monocryl and Dermabond. Sponge needle and instrument counts were correct.    Findings: As above  Estimated Blood Loss:  Minimal         Drains: none  Blood Given: none          Specimens: None        Complications:  * No complications entered in OR log *         Disposition: PACU - hemodynamically stable.         Condition: stable

## 2015-03-09 NOTE — Anesthesia Preprocedure Evaluation (Addendum)
Anesthesia Evaluation  Patient identified by MRN, date of birth, ID band Patient awake    Reviewed: Allergy & Precautions, H&P , NPO status , Patient's Chart, lab work & pertinent test results  Airway Mallampati: II  TM Distance: >3 FB Neck ROM: Full    Dental  (+) Teeth Intact, Dental Advisory Given   Pulmonary shortness of breath, pneumonia -, resolved, COPDformer smoker,  breath sounds clear to auscultation        Cardiovascular hypertension, Pt. on medications + CAD (Drug eluting stents placed ini July 3267 without complications), + Peripheral Vascular Disease and +CHF Rhythm:Regular Rate:Normal     Neuro/Psych PSYCHIATRIC DISORDERS Anxiety    GI/Hepatic Neg liver ROS, hiatal hernia, GERD-  Medicated and Controlled,Liver mets   Endo/Other  negative endocrine ROS  Renal/GU Renal disease     Musculoskeletal  (+) Arthritis -,   Abdominal   Peds  Hematology negative hematology ROS (+) anemia , ITP   Anesthesia Other Findings   Reproductive/Obstetrics negative OB ROS                            Anesthesia Physical  Anesthesia Plan  ASA: III  Anesthesia Plan: General   Post-op Pain Management:    Induction: Intravenous  Airway Management Planned: Oral ETT  Additional Equipment:   Intra-op Plan:   Post-operative Plan: Extubation in OR  Informed Consent: I have reviewed the patients History and Physical, chart, labs and discussed the procedure including the risks, benefits and alternatives for the proposed anesthesia with the patient or authorized representative who has indicated his/her understanding and acceptance.   Dental advisory given  Plan Discussed with: CRNA  Anesthesia Plan Comments:         Anesthesia Quick Evaluation

## 2015-03-10 ENCOUNTER — Encounter (HOSPITAL_COMMUNITY): Payer: Self-pay | Admitting: General Surgery

## 2015-03-10 LAB — CBC
HCT: 30.4 % — ABNORMAL LOW (ref 39.0–52.0)
HEMOGLOBIN: 9.4 g/dL — AB (ref 13.0–17.0)
MCH: 27.8 pg (ref 26.0–34.0)
MCHC: 30.9 g/dL (ref 30.0–36.0)
MCV: 89.9 fL (ref 78.0–100.0)
Platelets: 297 10*3/uL (ref 150–400)
RBC: 3.38 MIL/uL — ABNORMAL LOW (ref 4.22–5.81)
RDW: 21.3 % — ABNORMAL HIGH (ref 11.5–15.5)
WBC: 8.8 10*3/uL (ref 4.0–10.5)

## 2015-03-10 LAB — BASIC METABOLIC PANEL
Anion gap: 9 (ref 5–15)
BUN: 15 mg/dL (ref 6–23)
CALCIUM: 9 mg/dL (ref 8.4–10.5)
CO2: 23 mmol/L (ref 19–32)
Chloride: 104 mmol/L (ref 96–112)
Creatinine, Ser: 1.05 mg/dL (ref 0.50–1.35)
GFR calc Af Amer: 73 mL/min — ABNORMAL LOW (ref >90)
GFR calc non Af Amer: 63 mL/min — ABNORMAL LOW (ref >90)
Glucose, Bld: 178 mg/dL — ABNORMAL HIGH (ref 70–99)
Potassium: 4.6 mmol/L (ref 3.5–5.1)
Sodium: 136 mmol/L (ref 135–145)

## 2015-03-10 NOTE — Progress Notes (Signed)
IP PROGRESS NOTE  Subjective:   Mr. Propes is alert. He underwent a gastrojejunostomy procedure yesterday.  Objective: Vital signs in last 24 hours: Blood pressure 104/41, pulse 50, temperature 98 F (36.7 C), temperature source Oral, resp. rate 16, height 5' 11.5" (1.816 m), weight 228 lb 2.8 oz (103.5 kg), SpO2 94 %.  Intake/Output from previous day: 03/17 0701 - 03/18 0700 In: 2035 [I.V.:2035] Out: 880 [Urine:880]  Physical Exam:  Abdomen: Dry laparoscopic incisions  Lab Results:  Recent Labs  03/08/15 0510 03/10/15 0508  WBC 8.3 8.8  HGB 9.4* 9.4*  HCT 30.1* 30.4*  PLT 331 297    BMET  Recent Labs  03/08/15 0510 03/10/15 0508  NA 136 136  K 4.3 4.6  CL 100 104  CO2 26 23  GLUCOSE 101* 178*  BUN 16 15  CREATININE 1.03 1.05  CALCIUM 8.8 9.0    Studies/Results: No results found.  Medications: I have reviewed the patient's current medications.  Assessment/Plan:  1.Duodenal mass, abdominal/retroperitoneal lymphadenopathy, and liver metastases noted on a CT 03/01/2015  Upper endoscopy 03/03/2015 confirmed a duodenal mass, status post a biopsy with the final pathology confirming a carcinoma, immunohistochemical stains negative for lymphoma, neuroendocrine carcinoma, and melanoma markers. Positive staining for CDX2, cytokeratin AE1/AE3, and CD10  2. Gastric outlet obstruction secondary to #1, status post a palliative gastrojejunostomy 03/09/2015  3. 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 Portal vein thrombosis diagnosed in September 2015  5. Left leg DVT December 2014  6. History of coronary artery disease, status post an LAD drug-eluting stent July 2015  7. Remote history of melanoma of the right ear  8. 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 History of drug related ITP  10. Abdominal pain secondary to the duodenal mass/liver metastases/adenopathy  Mr. Tomko is recovering from the  gastrojejunostomy procedure. I had additional discussion with Mr. Kervin and his family regarding the cancer diagnosis. He most likely has a gastrointestinal malignancy. We will consider chemotherapy options when he has recovered from surgery.  I will check on him in the hospital next week and we will schedule an outpatient appointment at the [facility].    LOS: 9 days   Hebah Bogosian  03/10/2015, 11:58 AM

## 2015-03-10 NOTE — Progress Notes (Signed)
SUBJECTIVE:  No complaints  OBJECTIVE:   Vitals:   Filed Vitals:   03/09/15 1253 03/09/15 1446 03/09/15 2208 03/10/15 0620  BP: 138/59 119/58 113/48 104/41  Pulse: 67 78 60 50  Temp: 97.3 F (36.3 C) 97.5 F (36.4 C) 97.4 F (36.3 C) 98 F (36.7 C)  TempSrc:  Oral Oral Oral  Resp: 12 14 18 16   Height:      Weight:      SpO2: 98% 98% 96% 94%   I&O's:   Intake/Output Summary (Last 24 hours) at 03/10/15 0710 Last data filed at 03/10/15 0600  Gross per 24 hour  Intake   2035 ml  Output    880 ml  Net   1155 ml   TELEMETRY: Reviewed telemetry pt in NSR:     PHYSICAL EXAM General: Well developed, well nourished, in no acute distress Head: Eyes PERRLA, No xanthomas.   Normal cephalic and atramatic  Lungs:   Clear bilaterally to auscultation and percussion. Heart:   HRRR S1 S2 Pulses are 2+ & equal. Abdomen: Bowel sounds are positive, abdomen soft and non-tender without masses  Extremities:   No clubbing, cyanosis or edema.  DP +1 Neuro: Alert and oriented X 3. Psych:  Good affect, responds appropriately   LABS: Basic Metabolic Panel:  Recent Labs  03/08/15 0510 03/10/15 0508  NA 136 136  K 4.3 4.6  CL 100 104  CO2 26 23  GLUCOSE 101* 178*  BUN 16 15  CREATININE 1.03 1.05  CALCIUM 8.8 9.0   Liver Function Tests: No results for input(s): AST, ALT, ALKPHOS, BILITOT, PROT, ALBUMIN in the last 72 hours. No results for input(s): LIPASE, AMYLASE in the last 72 hours. CBC:  Recent Labs  03/08/15 0510 03/10/15 0508  WBC 8.3 8.8  HGB 9.4* 9.4*  HCT 30.1* 30.4*  MCV 88.5 89.9  PLT 331 297   Cardiac Enzymes: No results for input(s): CKTOTAL, CKMB, CKMBINDEX, TROPONINI in the last 72 hours. BNP: Invalid input(s): POCBNP D-Dimer: No results for input(s): DDIMER in the last 72 hours. Hemoglobin A1C: No results for input(s): HGBA1C in the last 72 hours. Fasting Lipid Panel: No results for input(s): CHOL, HDL, LDLCALC, TRIG, CHOLHDL, LDLDIRECT in the  last 72 hours. Thyroid Function Tests: No results for input(s): TSH, T4TOTAL, T3FREE, THYROIDAB in the last 72 hours.  Invalid input(s): FREET3 Anemia Panel: No results for input(s): VITAMINB12, FOLATE, FERRITIN, TIBC, IRON, RETICCTPCT in the last 72 hours. Coag Panel:   Lab Results  Component Value Date   INR 1.29 03/08/2015   INR 1.15 03/03/2015   INR 1.18 03/02/2015    RADIOLOGY: Dg Chest 2 View  03/01/2015   CLINICAL DATA:  CHF.  EXAM: CHEST  2 VIEW  COMPARISON:  05/27/2014  FINDINGS: Heart size is normal. Negative for pulmonary edema. Densities at the left lung base and along the left cardiac border appear chronic. Atherosclerotic calcifications in the thoracic aorta. Negative for pleural effusions.  IMPRESSION: No acute chest findings.   Electronically Signed   By: Markus Daft M.D.   On: 03/01/2015 23:44   Ct Abdomen Pelvis W Contrast  03/01/2015   CLINICAL DATA:  Right flank pain which began 10 days ago. Progressively worsening. Bladder cancer diagnosed 02/03/2015  EXAM: CT ABDOMEN AND PELVIS WITH CONTRAST  TECHNIQUE: Multidetector CT imaging of the abdomen and pelvis was performed using the standard protocol following bolus administration of intravenous contrast.  CONTRAST:  157mL OMNIPAQUE IOHEXOL 300 MG/ML SOLN, 17mL OMNIPAQUE IOHEXOL  300 MG/ML SOLN  COMPARISON:  09/24/2014  FINDINGS: Lower chest: The lung bases are clear of acute process. Minimal dependent atelectasis and basilar scarring changes. The heart is normal in size coronary artery calcifications are noted. Moderate aortic calcifications. There is a small hiatal hernia.  Hepatobiliary: The E liver demonstrates a bizarre enhancement pattern due to portal vein thrombosis also demonstrated on the prior study. The right hepatic lobe liver lesions seen on the prior study have coalesced into a large mass occupying the inferior aspect of the right hepatic lobe.  Pancreas: Stable fatty changes.  No mass or inflammation.  Spleen: Normal  size.  No focal lesions.  Adrenals/Urinary Tract: Stable right adrenal gland angioma mild lipoma. The kidneys are unremarkable.  Stomach/Bowel: The stomach is unremarkable. There is a large duodenum mass likely accounting for the patient's hepatic metastatic disease. There is also now bulky portal, peripancreatic and celiac axis lymphadenopathy. A necrotic appearing node measures 4.1 x 2.7 cm on image number 24. The duodenum mass measures approximately 4.3 x 4.3 cm. The small bowel and colon are unremarkable. No inflammation or obstruction.  Vascular/Lymphatic: There is left-sided retroperitoneal lymphadenopathy in addition to the upper abdominal adenopathy surrounding the duodenum. The aorta demonstrates stable atherosclerotic calcifications. No focal aneurysm.  Pelvis: Without obvious discrete bladder mass. Prostate gland is enlarged. The seminal vesicles are unremarkable. No pelvic lymphadenopathy. No inguinal lymphadenopathy. A left inguinal hernia is noted containing fat. There is a lesion in the left ileus psoas muscle suspicious for a muscle metastasis. A liquified hematoma is also possible.  Musculoskeletal: No obvious destructive metastatic bone lesions. There is a stable sclerotic lesion involving the right acetabular. Vague lucencies are noted in the left iliac bone anteriorly.  IMPRESSION: 1. Bulky duodenal mass with significant adjacent right upper quadrant lymphadenopathy. There is also hepatic metastatic disease, retroperitoneal lymphadenopathy and portal vein thrombosis. 2. Possible metastatic lesion involving the left ileal psoas muscle. A liquified hematoma is also possible. 3. Stable small lucencies in the left iliac bone. No obvious osseous metastatic disease.   Electronically Signed   By: Marijo Sanes M.D.   On: 03/01/2015 20:12   Dg Abd 2 Views  03/05/2015   CLINICAL DATA:  Abdominal distention and pain for 10 days. History of bowel obstruction and duodenal mass. Porta hepatis adenopathy  and liver mass.  EXAM: ABDOMEN - 2 VIEW  COMPARISON:  03/01/2015  FINDINGS: Orally administered contrast medium is present in the colon. Thoracolumbar spondylosis. No free intraperitoneal gas.  No dilated bowel or abnormal air-fluid levels. Stable mixed sclerotic and lucent lesion of the right acetabular roof, stable.  IMPRESSION: 1. No dilated bowel or significant abnormal air fluid levels are observed to favor current obstruction. Orally administered contrast medium is present in the colon.   Electronically Signed   By: Van Clines M.D.   On: 03/05/2015 17:25   ASSESSMENT AND PLAN  81 year old male admitted with duodenal mass causing gastric outlet obstruction now s/p laparoscopic gastrojejunostomy  1. CAD - the patient is off ASA and plavix since 03/02/15 for surgery, it has been 8 months since his stent, his EGG today is unchanged from prior.  He has been on Plavix > 6 months s/p 2nd generation DES, restart ASA post surgery as soon as ok with surgery. Continue pravastatin, no betablocker as he is bradycardic at baseline.  2. Chronic diastolic CHF - he was on lasix  daily prior to the admission, we will monitor post surgery for fluid overload and  management. 2D echo showed normal LVF with mild to moderate MR  3. HTN: controlled.  No new recs at this time. Restart  ASA and soon as ok with surgery.  Will sign off  Sueanne Margarita, MD  03/10/2015  7:10 AM

## 2015-03-10 NOTE — Progress Notes (Signed)
[facility] PROGRESS NOTE  James [name] ZOX:[id number] DOB: Feb 28, 1928 [name]: 03/01/2015 PCP: [name], MD  Assessment/Plan: 1. Duodenal mass/poorly differentiated high-grade carcinoma -CT scan of abdomen and pelvis performed on 03/01/2015 showing bulky duodenal mass wasn't thickened adjacent right upper quadrant lymphadenopathy. Also noted were hepatic metastatic disease, retroperitoneal lymphadenopathy and portal vein thrombosis. Possible metastatic disease involving left iliopsoas muscle. -Patient undergoing EGD on 03/03/2015 that showed fungating soft and friable mass in the duodenal bulb with significant luminal narrowing, multiple biopsies were taken. 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 surgery performing gastro-jejunostomy today, he tolerated procedure well, there where no complications.  -Advancing diet to clears today -Medical oncology following, will further discuss treatment options in the outpatient setting next week when recovered from surgery.   2.  Coronary artery disease. -Patient with history of coronary artery disease, status post cardiac catheterization on 06/22/2014 undergoing percutaneous intervention to mid LAD lesion with a drug-eluting stent. -Patient has been on antiplatelet therapy for 8 months -Plavix held for upcoming surgical procedure. -Cardiology consulted, plan to restart aspirin therapy post surgery   3.  Chronic diastolic congestive heart failure -Last transthoracic echocardiogram performed 12/08/2013 that showed preserved ejection fraction of 60-65% -Patient does not have clinical evidence of fluid overload, on IV fluids will continue to monitor.  4.  Urinary tract infection. -Urine culture from 03/02/2015 showing no growth -IV Levaquin stopped on 03/08/2015  5.  Hypothyroidism. 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 Patient was on Plavix for history of drug eluded stent placed in July, but this was held at the time of admission in anticipation of surgical procedures. GI perfomed EGD with biopsy on 03/03/15. Incidentally, patient was found to have hypothyroidism(tsh 12.2). Free T4 is normal. Patient was started on low dose Synthroid. He was also found to have UTI. He seemed to have some confusion-?due to UTI but this has cleared up today ?if recurrence, worry about brain mets. Urine cultures negative. Will continue Levaquin and await duodenal mass biopsy results. Keep holding Plavix as patient may need more invasive procedures. Will defer to GI/Oncology/IR/Surgery regarding further work up. Patient has tolerated food better overnight. However, there is concern that he may retaining food in the stomach because of duodenal mass.   Objective: Filed Vitals:   03/10/15 0620  BP: 104/41  Pulse: 50  Temp: 98 F (36.7 C)  Resp: 16    Intake/Output Summary (Last 24 hours) at 03/10/15 1354 Last data filed at 03/10/15 0600  Gross per 24 hour  Intake   1135 ml  Output    600 ml  Net    535 ml   Filed Weights   03/04/15 1338 03/05/15 0834 03/07/15 2238  Weight: 107.3 kg (236 lb 8.9 oz) 104.781 kg (231 lb) 103.5 kg (228 lb 2.8 oz)    Exam:   General:  Patient is in no acute distress, he is awake and alert oriented  Cardiovascular:  Regular rate and  rhythm normal S1-S2  Respiratory: Normal respiratory effort, lungs are clear to auscultation bilaterally no wheezing rhonchi or rales  Abdomen: Incision sites appear clean, there was mild tenderness to palpation across abdomen. No peritoneal signs.   Musculoskeletal: He has 1-2 less bilateral extremity pitting edema  Data Reviewed: Basic Metabolic Panel:  Recent Labs Lab 03/04/15 0507 03/05/15 0515 03/06/15 0440 03/08/15 0510 03/10/15 0508  NA 133* 135 136 136 136  K 4.7 4.7 4.6 4.3 4.6  CL 100 100 101 100 104  CO2 26 25 28 26 23   GLUCOSE 125* 115* 107* 101* 178*  BUN 15 16 18 16 15   CREATININE 1.03 1.02 0.99 1.03 1.05  CALCIUM 8.8 8.7 8.9 8.8 9.0   Liver Function Tests:  Recent Labs Lab 03/04/15 0507  AST 22  ALT 32  ALKPHOS 121*  BILITOT 0.5  PROT 5.9*  ALBUMIN 2.8*   No results for input(s): LIPASE, AMYLASE in the last 168 hours.  Recent Labs Lab 03/04/15 0507  AMMONIA 31   CBC:  Recent Labs Lab 03/04/15 0507 03/05/15 0515 03/06/15 0440 03/08/15 0510 03/10/15 0508  WBC 9.7 8.1 7.4 8.3 8.8  NEUTROABS 8.3*  --   --   --   --   HGB 9.6* 9.2* 9.0* 9.4* 9.4*  HCT 31.3* 29.8* 28.9* 30.1* 30.4*  MCV 88.9 88.4 89.2 88.5 89.9  PLT 305 316 281 331 297   Cardiac Enzymes: No results for input(s): CKTOTAL, CKMB, CKMBINDEX, TROPONINI in the last 168 hours. BNP (last 3 results) No results for input(s): BNP in the last 8760 hours.  ProBNP (last 3 results)  Recent Labs  05/29/14 1910 06/06/14 1244 11/29/14 0944  PROBNP 36.3 83.0 114.6    CBG: No results for input(s): GLUCAP in the last 168 hours.  Recent Results (from the past 240 hour(s))  Culture, Urine     Status: None   Collection Time: 03/02/15  8:40 PM  Result Value Ref Range Status   Specimen Description URINE, CLEAN CATCH  Final   Special Requests NONE  Final   Colony Count NO GROWTH Performed at [facility]   Final   Culture NO GROWTH Performed at [facility]    Final   Report Status 03/04/2015 FINAL  Final  Surgical pcr screen     Status: None   Collection Time: 03/07/15  2:20 PM  Result Value Ref Range Status   MRSA, PCR NEGATIVE NEGATIVE Final   Staphylococcus aureus NEGATIVE NEGATIVE Final    Comment:        The Xpert SA Assay (FDA approved for NASAL specimens in patients over 70 years of age), is one component of a comprehensive surveillance program.  Test performance has been validated by [facility] for patients greater than or equal to 33 year old. It is not intended to diagnose infection nor to guide or monitor treatment.      Studies: No results found.  Scheduled Meds: . allopurinol  150 mg Oral Daily  . antiseptic oral rinse  7 mL Mouth Rinse q12n4p  . chlorhexidine  15 mL Mouth Rinse BID  . feeding supplement (ENSURE COMPLETE)  237 mL Oral BID BM  . finasteride  5 mg Oral Daily  . iron polysaccharides  150 mg Oral BID  . levothyroxine  25 mcg Oral QAC breakfast  . morphine  15 mg Oral Q12H  . ondansetron  4 mg Intravenous Q6H  . pantoprazole (PROTONIX) IV  40 mg Intravenous  Q24H  . pravastatin  80 mg Oral q1800   Continuous Infusions: . dextrose 5 % and 0.45 % NaCl with KCl 20 mEq/L 100 mL/hr (03/09/15 1739)    Active Problems:   HYPERTENSION, BENIGN   CAD, NATIVE VESSEL   Chronic diastolic CHF (congestive heart failure)   Portal vein thrombosis   Anemia due to chronic blood loss   Duodenal mass   Abdominal pain   Hepatic metastases   Liver mass   Cancer related pain   CHF (congestive heart failure)   Protein-calorie malnutrition, severe   Hypothyroidism   UTI (urinary tract infection)   Duodenal obstruction, acquired   History of DVT of lower extremity    Time spent: 35 minutes    Kelvin Cellar  [facility] Pager [phone]. If 7PM-7AM, please contact night-coverage at [email or web address], password [facility] 03/10/2015, 1:54 PM  LOS: 9 days

## 2015-03-10 NOTE — Evaluation (Signed)
Physical Therapy Evaluation Patient Details Name: James [name] MRN: [id number] DOB: 1928/01/23 Today's Date: 03/10/2015   History of Present Illness  81 yo male s/p gatrojejunostomy 03/09/15.   Clinical Impression  On eval, pt was Min guard assist for mobility-able to ambulate ~1200 feet. Pt tolerated activity well. Dyspnea 2/4 with ambulation. Pt reports he has already walked with nursing earlier today as well. Do not feel pt has any f/u PT needs at this time. Recommend daily ambulation in hallway 2-3x/day with nursing or family supervision. Will sign off.     Follow Up Recommendations No PT follow up; Intermittent supervision    Equipment Recommendations  None recommended by PT    Recommendations for Other Services       Precautions / Restrictions Precautions Precautions: Fall Precaution Comments: abdominal surgery Restrictions Weight Bearing Restrictions: No      Mobility  Bed Mobility Overal bed mobility: Modified Independent             General bed mobility comments: HOB elevated  Transfers Overall transfer level: Needs assistance Equipment used: None Transfers: Sit to/from Stand Sit to Stand: Supervision         General transfer comment: for safety  Ambulation/Gait Ambulation/Gait assistance: Min guard Ambulation Distance (Feet): 1200 Feet Assistive device: None Gait Pattern/deviations: Step-through pattern     General Gait Details: first 500 feet with IV pole, remaining distance without external support. close guard for safety. good gait speed (slightly fast at times). dyspnea 2/4-pt reports this is normal for him  Financial trader Rankin (Stroke Patients Only)       Balance           Standing balance support: No upper extremity supported;During functional activity Standing balance-Leahy Scale: Good                               Pertinent Vitals/Pain Pain Assessment: Faces Faces  Pain Scale: Hurts little more Pain Location: abdomen Pain Intervention(s): Monitored during session    Home Living Family/patient expects to be discharged to:: Private residence Living Arrangements: Children (daughter) Available Help at Discharge: Family Type of Home: House Home Access: Level entry     Home Layout: Able to live on main level with bedroom/bathroom;Two level Home Equipment: Cane - single point;Walker - 2 wheels      Prior Function Level of Independence: Independent               Hand Dominance        Extremity/Trunk Assessment   Upper Extremity Assessment: Overall WFL for tasks assessed           Lower Extremity Assessment: Overall WFL for tasks assessed      Cervical / Trunk Assessment: Normal  Communication   Communication: No difficulties  Cognition Arousal/Alertness: Awake/alert Behavior During Therapy: WFL for tasks assessed/performed Overall Cognitive Status: Within Functional Limits for tasks assessed                      General Comments      Exercises        Assessment/Plan    PT Assessment Patent does not need any further PT services  PT Diagnosis Acute pain   PT Problem List    PT Treatment Interventions     PT Goals (Current goals can be found in the Care  Plan section) Acute Rehab PT Goals Patient Stated Goal: to return to PLOF PT Goal Formulation: All assessment and education complete, DC therapy    Frequency     Barriers to discharge        Co-evaluation               End of Session Equipment Utilized During Treatment: Gait belt Activity Tolerance: Patient tolerated treatment well Patient left: in bed;with call bell/phone within reach;with family/visitor present           Time: 7096-4383 PT Time Calculation (min) (ACUTE ONLY): 20 min   Charges:   PT Evaluation $Initial PT Evaluation Tier I: 1 Procedure     PT G Codes:        Weston Anna, MPT Pager: [phone]

## 2015-03-10 NOTE — Progress Notes (Signed)
Patient ID: James [name], male   DOB: 09/19/1928, 81 y.o.   MRN: [id number] 1 Day Post-Op  Subjective: No complaints this morning. A little mild abdominal discomfort. No nausea, some occasional belching.  Objective: Vital signs in last 24 hours: Temp:  [97.1 F (36.2 C)-98 F (36.7 C)] 98 F (36.7 C) (03/18 0620) Pulse Rate:  [50-78] 50 (03/18 0620) Resp:  [10-18] 16 (03/18 0620) BP: (104-140)/(41-59) 104/41 mmHg (03/18 0620) SpO2:  [94 %-100 %] 94 % (03/18 0620) Last BM Date: 03/05/15  Intake/Output from previous day: 03/17 0701 - 03/18 0700 In: 2035 [I.V.:2035] Out: 880 [Urine:880] Intake/Output this shift:    General appearance: alert, cooperative and no distress GI: normal findings: soft, non-tender Incision/Wound: clean and dry  Lab Results:   Recent Labs  03/08/15 0510 03/10/15 0508  WBC 8.3 8.8  HGB 9.4* 9.4*  HCT 30.1* 30.4*  PLT 331 297   BMET  Recent Labs  03/08/15 0510 03/10/15 0508  NA 136 136  K 4.3 4.6  CL 100 104  CO2 26 23  GLUCOSE 101* 178*  BUN 16 15  CREATININE 1.03 1.05  CALCIUM 8.8 9.0     Studies/Results: No results found.  Anti-infectives: Anti-infectives    Start     Dose/Rate Route Frequency Ordered Stop   03/08/15 0600  cefoTEtan (CEFOTAN) 2 g in dextrose 5 % 50 mL IVPB     2 g 100 mL/hr over 30 Minutes Intravenous On call to O.R. 03/07/15 1258 03/09/15 0559   03/03/15 0800  levofloxacin (LEVAQUIN) IVPB 750 mg  Status:  Discontinued     750 mg 100 mL/hr over 90 Minutes Intravenous Daily 03/03/15 0742 03/08/15 1410      Assessment/Plan: s/p Procedure(s): LAPAROSCOPIC GASTROJEJUNOSTOMY Doing well without apparent complication. Start clear liquid diet.   LOS: 9 days    [name] T 03/10/2015

## 2015-03-11 MED ORDER — BISACODYL 10 MG RE SUPP
10.0000 mg | Freq: Every day | RECTAL | Status: DC | PRN
  Administered 2015-03-11: 10 mg via RECTAL
  Filled 2015-03-11: qty 1

## 2015-03-11 MED ORDER — POLYETHYLENE GLYCOL 3350 17 G PO PACK
17.0000 g | PACK | Freq: Two times a day (BID) | ORAL | Status: DC
Start: 2015-03-11 — End: 2015-03-16
  Administered 2015-03-11 – 2015-03-16 (×11): 17 g via ORAL
  Filled 2015-03-11 (×10): qty 1

## 2015-03-11 MED ORDER — CLOPIDOGREL BISULFATE 75 MG PO TABS
75.0000 mg | ORAL_TABLET | Freq: Every day | ORAL | Status: DC
  Filled 2015-03-11 (×2): qty 1

## 2015-03-11 MED ORDER — ASPIRIN 81 MG PO CHEW
81.0000 mg | CHEWABLE_TABLET | Freq: Every day | ORAL | Status: DC
  Administered 2015-03-11 – 2015-03-16 (×6): 81 mg via ORAL
  Filled 2015-03-11 (×6): qty 1

## 2015-03-11 NOTE — Progress Notes (Signed)
Patient ID: James [name], male   DOB: 01-Jul-1928, 81 y.o.   MRN: [id number] 2 Days Post-Op  Subjective: Feels pretty well. Not needing any pain medication. Tolerating a clear liquid diet without nausea or bloating. Has not had a bowel movement.  Objective: Vital signs in last 24 hours: Temp:  [97.5 F (36.4 C)-97.8 F (36.6 C)] 97.7 F (36.5 C) (03/19 0700) Pulse Rate:  [48-57] 48 (03/19 0700) Resp:  [16-17] 16 (03/19 0700) BP: (93-130)/(45-50) 93/50 mmHg (03/19 0700) SpO2:  [95 %-100 %] 95 % (03/19 0700) Last BM Date: 03/05/15  Intake/Output from previous day: 03/18 0701 - 03/19 0700 In: 240 [P.O.:240] Out: 1425 [Urine:1425] Intake/Output this shift:    General appearance: alert, cooperative, no distress and ambulatory GI: normal findings: soft, non-tender Incision/Wound: clean and dry  Lab Results:   Recent Labs  03/10/15 0508  WBC 8.8  HGB 9.4*  HCT 30.4*  PLT 297   BMET  Recent Labs  03/10/15 0508  NA 136  K 4.6  CL 104  CO2 23  GLUCOSE 178*  BUN 15  CREATININE 1.05  CALCIUM 9.0     Studies/Results: No results found.  Anti-infectives: Anti-infectives    Start     Dose/Rate Route Frequency Ordered Stop   03/08/15 0600  cefoTEtan (CEFOTAN) 2 g in dextrose 5 % 50 mL IVPB     2 g 100 mL/hr over 30 Minutes Intravenous On call to O.R. 03/07/15 1258 03/09/15 0559   03/03/15 0800  levofloxacin (LEVAQUIN) IVPB 750 mg  Status:  Discontinued     750 mg 100 mL/hr over 90 Minutes Intravenous Daily 03/03/15 0742 03/08/15 1410      Assessment/Plan: s/p Procedure(s): LAPAROSCOPIC GASTROJEJUNOSTOMY Doing well postoperatively without apparent complication Advance to full liquid diet Was constipated preop. We will give Mira lax today.   LOS: 10 days    Joyanne Eddinger T 03/11/2015

## 2015-03-11 NOTE — Progress Notes (Signed)
[facility] PROGRESS NOTE  James [name] TTS:[id number] DOB: [birth year] [name]: 03/01/2015 PCP: [name], MD  Assessment/Plan: 1. Duodenal mass/poorly differentiated high-grade carcinoma -CT scan of abdomen and pelvis performed on 03/01/2015 showing bulky duodenal mass wasn't thickened adjacent right upper quadrant lymphadenopathy. Also noted were hepatic metastatic disease, retroperitoneal lymphadenopathy and portal vein thrombosis. Possible metastatic disease involving left iliopsoas muscle. -Patient undergoing EGD on 03/03/2015 that showed fungating soft and friable mass in the duodenal bulb with significant luminal narrowing, multiple biopsies were taken. 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 surgery performing gastro-jejunostomy today, he tolerated procedure well, there where no complications.  -Advancing diet to clears today -Medical oncology following, will further discuss treatment options in the outpatient setting next week when recovered from surgery.  -Advanced diet to full liquid.   2.  Coronary artery disease. -Patient with history of coronary artery disease, status post cardiac catheterization on 06/22/2014 undergoing percutaneous intervention to mid LAD lesion with a drug-eluting stent. -Patient has been on antiplatelet therapy for 8 months -Plavix held for upcoming surgical procedure. -Cardiology consulted, plan to restart aspirin therapy post surgery   3.  Chronic diastolic congestive heart failure -Last transthoracic echocardiogram performed 12/08/2013 that showed preserved ejection fraction of 60-65% -Patient does not have clinical evidence of fluid overload, on IV fluids will continue to monitor.  4.  Urinary tract infection. -Urine culture from 03/02/2015 showing no growth -IV Levaquin stopped on 03/08/2015  5.  Hypothyroidism. -Continue Synthroid  Code Status: Full code Family Communication:  Disposition Plan:  s/p surgical intervention, pt consultation, anticipate discharge home when medically stable   Consultants:  General surgery  Medical oncology  GI  Procedures:  EGD  Laparoscopic Gastrojejunostomy performed on 03/09/2015   HPI/Subjective: James [name] is a pleasant 81 yo male with history of coronary artery disease, diastolic heart failure, chronic anemia due to chronic blood loss, portal vein thrombosis, urothelial bladder CA(biopsy 02/06/15), who presented with excruciating right low quadrant pain and was found to have new fungating duodenal mass as well as large hepatic masses. Patient was on Plavix for history of drug eluded stent placed in July, but this was held at the time of admission in anticipation of surgical procedures. GI perfomed EGD with biopsy on 03/03/15. Incidentally, patient was found to have hypothyroidism(tsh 12.2). Free T4 is normal. Patient was started on low dose Synthroid. He was also found to have UTI. He seemed to have some confusion-?due to UTI but this has cleared up today ?if recurrence, worry about brain mets. Urine cultures negative. Will continue Levaquin and await duodenal mass biopsy results. Keep holding Plavix as patient may need more invasive procedures. Will defer to GI/Oncology/IR/Surgery regarding further work up. Patient has tolerated food better overnight. However, there is concern that he may retaining food in the stomach because of duodenal mass.   Objective: Filed Vitals:   03/11/15 1412  BP: 123/55  Pulse: 53  Temp:   Resp: 16    Intake/Output Summary (Last 24 hours) at 03/11/15 1602 Last data filed at 03/11/15 1100  Gross per 24 hour  Intake    240 ml  Output   1725 ml  Net  -1485 ml   Filed Weights   03/04/15 1338 03/05/15 0834 03/07/15 2238  Weight: 107.3 kg (236 lb 8.9 oz) 104.781 kg (231 lb) 103.5 kg (228 lb 2.8 oz)    Exam:   General:  Patient is in no acute distress, he is awake and alert oriented  Cardiovascular:  Regular rate and rhythm normal S1-S2  Respiratory: Normal respiratory effort, lungs are clear to auscultation bilaterally no wheezing rhonchi or rales  Abdomen: Incision sites appear clean, there was mild tenderness to palpation across abdomen. No peritoneal signs.   Musculoskeletal: He has 1-2 less bilateral extremity pitting edema  Data Reviewed: Basic Metabolic Panel:  Recent Labs Lab 03/05/15 0515 03/06/15 0440 03/08/15 0510 03/10/15 0508  NA 135 136 136 136  K 4.7 4.6 4.3 4.6  CL 100 101 100 104  CO2 25 28 26 23   GLUCOSE 115* 107* 101* 178*  BUN 16 18 16 15   CREATININE 1.02 0.99 1.03 1.05  CALCIUM 8.7 8.9 8.8 9.0   Liver Function Tests: No results for input(s): AST, ALT, ALKPHOS, BILITOT, PROT, ALBUMIN in the last 168 hours. No results for input(s): LIPASE, AMYLASE in the last 168 hours. No results for input(s): AMMONIA in the last 168 hours. CBC:  Recent Labs Lab 03/05/15 0515 03/06/15 0440 03/08/15 0510 03/10/15 0508  WBC 8.1 7.4 8.3 8.8  HGB 9.2* 9.0* 9.4* 9.4*  HCT 29.8* 28.9* 30.1* 30.4*  MCV 88.4 89.2 88.5 89.9  PLT 316 281 331 297   Cardiac Enzymes: No results for input(s): CKTOTAL, CKMB, CKMBINDEX, TROPONINI in the last 168 hours. BNP (last 3 results) No results for input(s): BNP in the last 8760 hours.  ProBNP (last 3 results)  Recent Labs  05/29/14 1910 06/06/14 1244 11/29/14 0944  PROBNP 36.3 83.0 114.6    CBG: No results for input(s): GLUCAP in the last 168 hours.  Recent Results (from the past 240 hour(s))  Culture, Urine     Status: None   Collection Time: 03/02/15  8:40 PM  Result Value Ref Range Status   Specimen Description URINE, CLEAN CATCH  Final   Special Requests NONE  Final   Colony Count NO GROWTH Performed at [facility]   Final   Culture NO GROWTH Performed at [facility]   Final   Report Status 03/04/2015 FINAL  Final  Surgical pcr screen     Status: None   Collection Time: 03/07/15  2:20 PM   Result Value Ref Range Status   MRSA, PCR NEGATIVE NEGATIVE Final   Staphylococcus aureus NEGATIVE NEGATIVE Final    Comment:        The Xpert SA Assay (FDA approved for NASAL specimens in patients over 30 years of age), is one component of a comprehensive surveillance program.  Test performance has been validated by [facility] for patients greater than or equal to 22 year old. It is not intended to diagnose infection nor to guide or monitor treatment.      Studies: No results found.  Scheduled Meds: . allopurinol  150 mg Oral Daily  . antiseptic oral rinse  7 mL Mouth Rinse q12n4p  . aspirin  81 mg Oral Daily  . chlorhexidine  15 mL Mouth Rinse BID  . feeding supplement (ENSURE COMPLETE)  237 mL Oral BID BM  . finasteride  5 mg Oral Daily  . iron polysaccharides  150 mg Oral BID  . levothyroxine  25 mcg Oral QAC breakfast  . morphine  15 mg Oral Q12H  . ondansetron  4 mg Intravenous Q6H  . pantoprazole (PROTONIX) IV  40 mg Intravenous Q24H  . polyethylene glycol  17 g Oral BID  . pravastatin  80 mg Oral q1800   Continuous Infusions: . dextrose 5 % and 0.45 % NaCl with KCl 20 mEq/L 50  mL/hr at 03/10/15 2130    Active Problems:   HYPERTENSION, BENIGN   CAD, NATIVE VESSEL   Chronic diastolic CHF (congestive heart failure)   Portal vein thrombosis   Anemia due to chronic blood loss   Duodenal mass   Abdominal pain   Hepatic metastases   Liver mass   Cancer related pain   CHF (congestive heart failure)   Protein-calorie malnutrition, severe   Hypothyroidism   UTI (urinary tract infection)   Duodenal obstruction, acquired   History of DVT of lower extremity    Time spent: 15 minutes    Kelvin Cellar  [facility] Pager [phone]. If 7PM-7AM, please contact night-coverage at [email or web address], password [facility] 03/11/2015, 4:02 PM  LOS: 10 days

## 2015-03-12 DIAGNOSIS — D500 Iron deficiency anemia secondary to blood loss (chronic): Secondary | ICD-10-CM

## 2015-03-12 MED ORDER — SPIRONOLACTONE 25 MG PO TABS
25.0000 mg | ORAL_TABLET | Freq: Every day | ORAL | Status: DC
  Administered 2015-03-12 – 2015-03-16 (×5): 25 mg via ORAL
  Filled 2015-03-12 (×5): qty 1

## 2015-03-12 MED ORDER — FUROSEMIDE 40 MG PO TABS
40.0000 mg | ORAL_TABLET | Freq: Every day | ORAL | Status: DC
Start: 2015-03-12 — End: 2015-03-16
  Administered 2015-03-12 – 2015-03-16 (×5): 40 mg via ORAL
  Filled 2015-03-12 (×5): qty 1

## 2015-03-12 MED ORDER — FLEET ENEMA 7-19 GM/118ML RE ENEM
1.0000 | ENEMA | Freq: Once | RECTAL | Status: AC
  Administered 2015-03-12: 1 via RECTAL
  Filled 2015-03-12: qty 1

## 2015-03-12 NOTE — Progress Notes (Signed)
Patient ID: James [name], male   DOB: [birth year], 81 y.o.   MRN: [id number]  [facility]  POD#: 3  Subjective: Patient up and ambulating this AM.  Daughter at bedside.  Complains of abdominal distension, had one small BM.  Objective: Vital signs in last 24 hours: Temp:  [97.8 F (36.6 C)-97.9 F (36.6 C)] 97.8 F (36.6 C) (03/20 0539) Pulse Rate:  [48-55] 48 (03/20 0539) Resp:  [16-18] 16 (03/20 0539) BP: (103-123)/(49-55) 103/49 mmHg (03/20 0539) SpO2:  [97 %-100 %] 97 % (03/20 0539) Last BM Date: 03/11/15  Intake/Output from previous day: 03/19 0701 - 03/20 0700 In: 1100 [P.O.:500; I.V.:600] Out: 900 [Urine:900] Intake/Output this shift:    Physical Exam: HEENT - sclerae clear, mucous membranes moist Neck - soft Chest - clear bilaterally Cor - RRR Abdomen - protuberant, mild tightness; wounds dry and intact; active BS present; minimally tender Ext - no edema, non-tender Neuro - alert & oriented, no focal deficits  Lab Results:   Recent Labs  03/10/15 0508  WBC 8.8  HGB 9.4*  HCT 30.4*  PLT 297   BMET  Recent Labs  03/10/15 0508  NA 136  K 4.6  CL 104  CO2 23  GLUCOSE 178*  BUN 15  CREATININE 1.05  CALCIUM 9.0   PT/INR No results for input(s): LABPROT, INR in the last 72 hours. Comprehensive Metabolic Panel:    Component Value Date/Time   NA 136 03/10/2015 0508   NA 136 03/08/2015 0510   NA 135* 02/23/2015 0845   NA 141 11/24/2013 0813   K 4.6 03/10/2015 0508   K 4.3 03/08/2015 0510   K 4.1 02/23/2015 0845   K 3.8 11/24/2013 0813   CL 104 03/10/2015 0508   CL 100 03/08/2015 0510   CO2 23 03/10/2015 0508   CO2 26 03/08/2015 0510   CO2 20* 02/23/2015 0845   CO2 23 11/24/2013 0813   BUN 15 03/10/2015 0508   BUN 16 03/08/2015 0510   BUN 21.1 02/23/2015 0845   BUN 12.3 11/24/2013 0813   CREATININE 1.05 03/10/2015 0508   CREATININE 1.03 03/08/2015 0510   CREATININE 1.1 02/23/2015 0845   CREATININE  1.12 02/13/2015 0925   CREATININE 1.23 11/07/2014 0921   CREATININE 0.9 11/24/2013 0813   GLUCOSE 178* 03/10/2015 0508   GLUCOSE 101* 03/08/2015 0510   GLUCOSE 155* 02/23/2015 0845   GLUCOSE 163* 11/24/2013 0813   CALCIUM 9.0 03/10/2015 0508   CALCIUM 8.8 03/08/2015 0510   CALCIUM 9.4 02/23/2015 0845   CALCIUM 9.5 11/24/2013 0813   AST 22 03/04/2015 0507   AST 24 03/03/2015 0535   AST 20 02/23/2015 0845   AST 25 11/24/2013 0813   ALT 32 03/04/2015 0507   ALT 33 03/03/2015 0535   ALT 29 02/23/2015 0845   ALT 26 11/24/2013 0813   ALKPHOS 121* 03/04/2015 0507   ALKPHOS 124* 03/03/2015 0535   ALKPHOS 121 02/23/2015 0845   ALKPHOS 87 11/24/2013 0813   BILITOT 0.5 03/04/2015 0507   BILITOT 0.4 03/03/2015 0535   BILITOT 0.72 02/23/2015 0845   BILITOT 0.57 11/24/2013 0813   PROT 5.9* 03/04/2015 0507   PROT 6.2 03/03/2015 0535   PROT 6.4 02/23/2015 0845   PROT 7.0 11/24/2013 0813   ALBUMIN 2.8* 03/04/2015 0507   ALBUMIN 3.0* 03/03/2015 0535   ALBUMIN 2.9* 02/23/2015 0845   ALBUMIN 3.6 11/24/2013 0813    Studies/Results: No results found.  Anti-infectives: Anti-infectives  Start     Dose/Rate Route Frequency Ordered Stop   03/08/15 0600  cefoTEtan (CEFOTAN) 2 g in dextrose 5 % 50 mL IVPB     2 g 100 mL/hr over 30 Minutes Intravenous On call to O.R. 03/07/15 1258 03/09/15 0559   03/03/15 0800  levofloxacin (LEVAQUIN) IVPB 750 mg  Status:  Discontinued     750 mg 100 mL/hr over 90 Minutes Intravenous Daily 03/03/15 0742 03/08/15 1410      Assessment & Plans: Status post lap gastrojejunostomy  Continue full liquid diet today  Will give Fleets enema this AM  Continue Miralax orally  Re-start diuretics  OOB, ambulate in halls  Earnstine Regal, MD, [facility] Office: [place] 03/12/2015

## 2015-03-12 NOTE — Progress Notes (Signed)
[facility] PROGRESS NOTE  James [name] GUR:[id number] DOB: 02-18-1928 [name]: 03/01/2015 PCP: [name], MD  Assessment/Plan: 1. Duodenal mass/poorly differentiated high-grade carcinoma -CT scan of abdomen and pelvis performed on 03/01/2015 showing bulky duodenal mass wasn't thickened adjacent right upper quadrant lymphadenopathy. Also noted were hepatic metastatic disease, retroperitoneal lymphadenopathy and portal vein thrombosis. Possible metastatic disease involving left iliopsoas muscle. -Patient undergoing EGD on 03/03/2015 that showed fungating soft and friable mass in the duodenal bulb with significant luminal narrowing, multiple biopsies were taken. 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 surgery performing gastro-jejunostomy today, he tolerated procedure well, there where no complications.  -Advancing diet to clears today -Medical oncology following, will further discuss treatment options in the outpatient setting next week when recovered from surgery.  -Advanced diet to full liquid.   2.  Coronary artery disease. -Patient with history of coronary artery disease, status post cardiac catheterization on 06/22/2014 undergoing percutaneous intervention to mid LAD lesion with a drug-eluting stent. -His aspirin was restarted postoperatively. Having a second generation DES and after being on plavix for at least 6 months, Plavix was not restarted by Cards.   3.  Chronic diastolic congestive heart failure -Last transthoracic echocardiogram performed 12/08/2013 that showed preserved ejection fraction of 60-65% -His diuretics were restarted today, will stop IV fluids.   4.  Urinary tract infection. -Urine culture from 03/02/2015 showing no growth -IV Levaquin stopped on 03/08/2015  5.  Hypothyroidism. -Continue Synthroid  6. Constipation -Patient complaining of abdominal cramping, which in part could related to constipation -Surgery  giving Fleet   Code Status: Full code Family Communication:  Disposition Plan: s/p surgical intervention, pt consultation, anticipate discharge home when medically stable   Consultants:  General surgery  Medical oncology  GI  Procedures:  EGD  Laparoscopic Gastrojejunostomy performed on 03/09/2015   HPI/Subjective: James [name] is a pleasant 81 yo male with history of coronary artery disease, diastolic heart failure, chronic anemia due to chronic blood loss, portal vein thrombosis, urothelial bladder CA(biopsy 02/06/15), who presented with excruciating right low quadrant pain and was found to have new fungating duodenal mass as well as large hepatic masses. Patient was on Plavix for history of drug eluded stent placed in July, but this was held at the time of admission in anticipation of surgical procedures. GI perfomed EGD with biopsy on 03/03/15. Incidentally, patient was found to have hypothyroidism(tsh 12.2). Free T4 is normal. Patient was started on low dose Synthroid. He was also found to have UTI. He seemed to have some confusion-?due to UTI but this has cleared up today ?if recurrence, worry about brain mets. Urine cultures negative. Will continue Levaquin and await duodenal mass biopsy results. Keep holding Plavix as patient may need more invasive procedures. Will defer to GI/Oncology/IR/Surgery regarding further work up. Patient has tolerated food better overnight. However, there is concern that he may retaining food in the stomach because of duodenal mass.   Objective: Filed Vitals:   03/12/15 0539  BP: 103/49  Pulse: 48  Temp: 97.8 F (36.6 C)  Resp: 16    Intake/Output Summary (Last 24 hours) at 03/12/15 1233 Last data filed at 03/11/15 2000  Gross per 24 hour  Intake    860 ml  Output    600 ml  Net    260 ml   Filed Weights   03/04/15 1338 03/05/15 0834 03/07/15 2238  Weight: 107.3 kg (236 lb 8.9 oz) 104.781 kg (231 lb) 103.5 kg (228 lb 2.8 oz)  Exam:   General:  Patient is in no acute distress, he is awake and alert oriented  Cardiovascular: Regular rate and rhythm normal S1-S2  Respiratory: Normal respiratory effort, lungs are clear to auscultation bilaterally no wheezing rhonchi or rales  Abdomen: Incision sites appear clean, there was mild tenderness to palpation across abdomen. No peritoneal signs.   Musculoskeletal: He has 1-2 less bilateral extremity pitting edema  Data Reviewed: Basic Metabolic Panel:  Recent Labs Lab 03/06/15 0440 03/08/15 0510 03/10/15 0508  NA 136 136 136  K 4.6 4.3 4.6  CL 101 100 104  CO2 28 26 23   GLUCOSE 107* 101* 178*  BUN 18 16 15   CREATININE 0.99 1.03 1.05  CALCIUM 8.9 8.8 9.0   Liver Function Tests: No results for input(s): AST, ALT, ALKPHOS, BILITOT, PROT, ALBUMIN in the last 168 hours. No results for input(s): LIPASE, AMYLASE in the last 168 hours. No results for input(s): AMMONIA in the last 168 hours. CBC:  Recent Labs Lab 03/06/15 0440 03/08/15 0510 03/10/15 0508  WBC 7.4 8.3 8.8  HGB 9.0* 9.4* 9.4*  HCT 28.9* 30.1* 30.4*  MCV 89.2 88.5 89.9  PLT 281 331 297   Cardiac Enzymes: No results for input(s): CKTOTAL, CKMB, CKMBINDEX, TROPONINI in the last 168 hours. BNP (last 3 results) No results for input(s): BNP in the last 8760 hours.  ProBNP (last 3 results)  Recent Labs  05/29/14 1910 06/06/14 1244 11/29/14 0944  PROBNP 36.3 83.0 114.6    CBG: No results for input(s): GLUCAP in the last 168 hours.  Recent Results (from the past 240 hour(s))  Culture, Urine     Status: None   Collection Time: 03/02/15  8:40 PM  Result Value Ref Range Status   Specimen Description URINE, CLEAN CATCH  Final   Special Requests NONE  Final   Colony Count NO GROWTH Performed at [facility]   Final   Culture NO GROWTH Performed at [facility]   Final   Report Status 03/04/2015 FINAL  Final  Surgical pcr screen     Status: None   Collection  Time: 03/07/15  2:20 PM  Result Value Ref Range Status   MRSA, PCR NEGATIVE NEGATIVE Final   Staphylococcus aureus NEGATIVE NEGATIVE Final    Comment:        The Xpert SA Assay (FDA approved for NASAL specimens in patients over 25 years of age), is one component of a comprehensive surveillance program.  Test performance has been validated by [facility] for patients greater than or equal to 62 year old. It is not intended to diagnose infection nor to guide or monitor treatment.      Studies: No results found.  Scheduled Meds: . allopurinol  150 mg Oral Daily  . antiseptic oral rinse  7 mL Mouth Rinse q12n4p  . aspirin  81 mg Oral Daily  . chlorhexidine  15 mL Mouth Rinse BID  . feeding supplement (ENSURE COMPLETE)  237 mL Oral BID BM  . finasteride  5 mg Oral Daily  . furosemide  40 mg Oral Daily  . iron polysaccharides  150 mg Oral BID  . levothyroxine  25 mcg Oral QAC breakfast  . morphine  15 mg Oral Q12H  . ondansetron  4 mg Intravenous Q6H  . pantoprazole (PROTONIX) IV  40 mg Intravenous Q24H  . polyethylene glycol  17 g Oral BID  . pravastatin  80 mg Oral q1800  . spironolactone  25 mg Oral Daily  Continuous Infusions:    Active Problems:   HYPERTENSION, BENIGN   CAD, NATIVE VESSEL   Chronic diastolic CHF (congestive heart failure)   Portal vein thrombosis   Anemia due to chronic blood loss   Duodenal mass   Abdominal pain   Hepatic metastases   Liver mass   Cancer related pain   CHF (congestive heart failure)   Protein-calorie malnutrition, severe   Hypothyroidism   UTI (urinary tract infection)   Duodenal obstruction, acquired   History of DVT of lower extremity    Time spent: 15 minutes    Kelvin Cellar  [facility] Pager [phone]. If 7PM-7AM, please contact night-coverage at [email or web address], password [facility] 03/12/2015, 12:33 PM  LOS: 11 days

## 2015-03-13 DIAGNOSIS — C170 Malignant neoplasm of duodenum: Principal | ICD-10-CM

## 2015-03-13 LAB — BASIC METABOLIC PANEL
Anion gap: 10 (ref 5–15)
BUN: 12 mg/dL (ref 6–23)
CO2: 25 mmol/L (ref 19–32)
Calcium: 8.8 mg/dL (ref 8.4–10.5)
Chloride: 99 mmol/L (ref 96–112)
Creatinine, Ser: 1.04 mg/dL (ref 0.50–1.35)
GFR calc Af Amer: 74 mL/min — ABNORMAL LOW (ref >90)
GFR calc non Af Amer: 64 mL/min — ABNORMAL LOW (ref >90)
Glucose, Bld: 104 mg/dL — ABNORMAL HIGH (ref 70–99)
Potassium: 4.7 mmol/L (ref 3.5–5.1)
Sodium: 134 mmol/L — ABNORMAL LOW (ref 135–145)

## 2015-03-13 LAB — CBC
HCT: 31.7 % — ABNORMAL LOW (ref 39.0–52.0)
Hemoglobin: 10.1 g/dL — ABNORMAL LOW (ref 13.0–17.0)
MCH: 28.9 pg (ref 26.0–34.0)
MCHC: 31.9 g/dL (ref 30.0–36.0)
MCV: 90.6 fL (ref 78.0–100.0)
Platelets: 288 10*3/uL (ref 150–400)
RBC: 3.5 MIL/uL — ABNORMAL LOW (ref 4.22–5.81)
RDW: 22.3 % — ABNORMAL HIGH (ref 11.5–15.5)
WBC: 9.3 10*3/uL (ref 4.0–10.5)

## 2015-03-13 MED ORDER — BISACODYL 5 MG PO TBEC
5.0000 mg | DELAYED_RELEASE_TABLET | Freq: Two times a day (BID) | ORAL | Status: DC
  Administered 2015-03-13 – 2015-03-16 (×7): 5 mg via ORAL
  Filled 2015-03-13 (×7): qty 1

## 2015-03-13 NOTE — Progress Notes (Signed)
[facility] PROGRESS NOTE  James [name] KVQ:[id number] DOB: 1928-09-03 [name]: 03/01/2015 PCP: [name], MD  Interim summary James [name] is a pleasant 80 yo male with history of coronary artery disease, diastolic heart failure, chronic anemia due to chronic blood loss, portal vein thrombosis (previously on anticoagulation was stopped by his hematologist Dr Beryle Beams), 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 Patient with history of coronary artery disease, previously on dual agent antiplatelet therapy with drug eluded stent placed in July, but this was held at the time of admission in anticipation of surgical procedures. He had a CT scan of abdomen and pelvis performed on 03/01/2015 showing bulky duodenal mass with associated lymphadenopathy as well as findings suggestive of hepatic metastatic disease. GI perfomed EGD with biopsy on 03/03/15 that showed fungating soft and friable mass in the duodenal involvement with significant luminal narrowing. 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 General surgery was consulted as he underwent gastro-jejunostomy on 03/09/2015. He tolerated procedure well. With regard to antiplatelet therapy cardiology recommended restarting aspirin therapy, did not recommend restarting Plavix. He had a second generation drug-eluting stent placed greater than 6 months ago.   Assessment/Plan: 1. Duodenal mass/poorly differentiated high-grade carcinoma -CT scan of abdomen and pelvis performed on 03/01/2015 showing bulky duodenal mass wasn't thickened adjacent right upper quadrant lymphadenopathy. Also noted were hepatic metastatic disease, retroperitoneal lymphadenopathy and portal vein thrombosis. Possible metastatic disease involving left iliopsoas muscle. -Patient undergoing EGD on 03/03/2015 that showed fungating soft and friable mass in the duodenal bulb with significant luminal narrowing, multiple  biopsies were taken. 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 surgery performing gastro-jejunostomy today, he tolerated procedure well, there where no complications.  -Advancing diet to clears today -Medical oncology following, will further discuss treatment options in the outpatient setting next week when recovered from surgery.  -On full liquid diet however remains nauseous complaining of his abdomen feeling "bloated" Still hasn't had a bowel movement. General surgery gave Fleet enema yesterday with min success.He was given dulcolax and miralax today.     2.  Coronary artery disease. -Patient with history of coronary artery disease, status post cardiac catheterization on 06/22/2014 undergoing percutaneous intervention to mid LAD lesion with a drug-eluting stent. -His aspirin was restarted postoperatively. Having a second generation DES and after being on plavix for at least 6 months, Plavix was not restarted by Cards.   3.  Chronic diastolic congestive heart failure -Last transthoracic echocardiogram performed 12/08/2013 that showed preserved ejection fraction of 60-65% -His diuretics were restarted on 03/12/2015   4.  Urinary tract infection. -Urine culture from 03/02/2015 showing no growth -IV Levaquin stopped on 03/08/2015  5.  Hypothyroidism. -Continue Synthroid  6. Constipation -Patient complaining of abdominal cramping, which in part could related to constipation -Surgery giving Fleet   Code Status: Full code Family Communication:  Disposition Plan: s/p surgical intervention, pt consultation, anticipate discharge home when medically stable   Consultants:  General surgery  Medical oncology  GI  Procedures:  EGD  Laparoscopic Gastrojejunostomy performed on 03/09/2015   HPI/Subjective: He complains of ongoing abdominal distention, bloating, nausea, still no bowel movement.   Objective: Filed Vitals:   03/13/15 1348  BP:  114/55  Pulse: 73  Temp: 98.5 F (36.9 C)  Resp: 20    Intake/Output Summary (Last 24 hours) at 03/13/15 1435 Last data filed at 03/13/15 1408  Gross per 24 hour  Intake   1080 ml  Output  650 ml  Net    430 ml   Filed Weights   03/04/15 1338 03/05/15 0834 03/07/15 2238  Weight: 107.3 kg (236 lb 8.9 oz) 104.781 kg (231 lb) 103.5 kg (228 lb 2.8 oz)    Exam:   General:  Patient is in no acute distress, he is awake and alert oriented  Cardiovascular: Regular rate and rhythm normal S1-S2  Respiratory: Normal respiratory effort, lungs are clear to auscultation bilaterally no wheezing rhonchi or rales  Abdomen: Incision sites appear clean, there was mild tenderness to palpation across abdomen. No peritoneal signs.   Musculoskeletal: He has 1-2 less bilateral extremity pitting edema  Data Reviewed: Basic Metabolic Panel:  Recent Labs Lab 03/08/15 0510 03/10/15 0508 03/13/15 0513  NA 136 136 134*  K 4.3 4.6 4.7  CL 100 104 99  CO2 26 23 25   GLUCOSE 101* 178* 104*  BUN 16 15 12   CREATININE 1.03 1.05 1.04  CALCIUM 8.8 9.0 8.8   Liver Function Tests: No results for input(s): AST, ALT, ALKPHOS, BILITOT, PROT, ALBUMIN in the last 168 hours. No results for input(s): LIPASE, AMYLASE in the last 168 hours. No results for input(s): AMMONIA in the last 168 hours. CBC:  Recent Labs Lab 03/08/15 0510 03/10/15 0508 03/13/15 0513  WBC 8.3 8.8 9.3  HGB 9.4* 9.4* 10.1*  HCT 30.1* 30.4* 31.7*  MCV 88.5 89.9 90.6  PLT 331 297 288   Cardiac Enzymes: No results for input(s): CKTOTAL, CKMB, CKMBINDEX, TROPONINI in the last 168 hours. BNP (last 3 results) No results for input(s): BNP in the last 8760 hours.  ProBNP (last 3 results)  Recent Labs  05/29/14 1910 06/06/14 1244 11/29/14 0944  PROBNP 36.3 83.0 114.6    CBG: No results for input(s): GLUCAP in the last 168 hours.  Recent Results (from the past 240 hour(s))  Surgical pcr screen     Status: None    Collection Time: 03/07/15  2:20 PM  Result Value Ref Range Status   MRSA, PCR NEGATIVE NEGATIVE Final   Staphylococcus aureus NEGATIVE NEGATIVE Final    Comment:        The Xpert SA Assay (FDA approved for NASAL specimens in patients over 56 years of age), is one component of a comprehensive surveillance program.  Test performance has been validated by [facility] for patients greater than or equal to 49 year old. It is not intended to diagnose infection nor to guide or monitor treatment.      Studies: No results found.  Scheduled Meds: . allopurinol  150 mg Oral Daily  . antiseptic oral rinse  7 mL Mouth Rinse q12n4p  . aspirin  81 mg Oral Daily  . bisacodyl  5 mg Oral BID  . chlorhexidine  15 mL Mouth Rinse BID  . feeding supplement (ENSURE COMPLETE)  237 mL Oral BID BM  . finasteride  5 mg Oral Daily  . furosemide  40 mg Oral Daily  . iron polysaccharides  150 mg Oral BID  . levothyroxine  25 mcg Oral QAC breakfast  . morphine  15 mg Oral Q12H  . ondansetron  4 mg Intravenous Q6H  . pantoprazole (PROTONIX) IV  40 mg Intravenous Q24H  . polyethylene glycol  17 g Oral BID  . pravastatin  80 mg Oral q1800  . spironolactone  25 mg Oral Daily   Continuous Infusions:    Principal Problem:   Duodenal adenocarcinoma s/p resection [id number] Active Problems:   HYPERTENSION, BENIGN   CAD,  NATIVE VESSEL   Chronic diastolic CHF (congestive heart failure)   Portal vein thrombosis   Anemia due to chronic blood loss   Abdominal pain   Hepatic metastases   Liver mass   Cancer related pain   CHF (congestive heart failure)   Protein-calorie malnutrition, severe   Hypothyroidism   UTI (urinary tract infection)   Duodenal obstruction, acquired   History of DVT of lower extremity    Time spent: 15 minutes    Kelvin Cellar  [facility] Pager [phone]. If 7PM-7AM, please contact night-coverage at [email or web address], password [facility] 03/13/2015, 2:35 PM  LOS: 12 days

## 2015-03-13 NOTE — Progress Notes (Signed)
Patient ID: WARDEN BUFFA, male   DOB: 03/23/1928, 81 y.o.   MRN: [id number] 4 Days Post-Op  Subjective: Feels bloated and constipated. No bowel movement since admission. Up walking in the halls. No nausea or vomiting but doesn't want to try to eat due to bloating and constipation.  Objective: Vital signs in last 24 hours: Temp:  [97.4 F (36.3 C)-97.8 F (36.6 C)] 97.4 F (36.3 C) (03/21 0528) Pulse Rate:  [52-65] 65 (03/21 0528) Resp:  [16-22] 22 (03/21 0528) BP: (106-134)/(54-65) 134/64 mmHg (03/21 0528) SpO2:  [97 %-98 %] 98 % (03/21 0528) Last BM Date: 03/11/15  Intake/Output from previous day: 03/20 0701 - 03/21 0700 In: 240 [P.O.:240] Out: -  Intake/Output this shift: Total I/O In: 360 [P.O.:360] Out: -   General appearance: alert, cooperative and no distress GI: soft and nondistended. Mild appropriate incisional tenderness. Incision/Wound: clean and dry  Lab Results:   Recent Labs  03/13/15 0513  WBC 9.3  HGB 10.1*  HCT 31.7*  PLT 288   BMET  Recent Labs  03/13/15 0513  NA 134*  K 4.7  CL 99  CO2 25  GLUCOSE 104*  BUN 12  CREATININE 1.04  CALCIUM 8.8     Studies/Results: No results found.  Anti-infectives: Anti-infectives    Start     Dose/Rate Route Frequency Ordered Stop   03/08/15 0600  cefoTEtan (CEFOTAN) 2 g in dextrose 5 % 50 mL IVPB     2 g 100 mL/hr over 30 Minutes Intravenous On call to O.R. 03/07/15 1258 03/09/15 0559   03/03/15 0800  levofloxacin (LEVAQUIN) IVPB 750 mg  Status:  Discontinued     750 mg 100 mL/hr over 90 Minutes Intravenous Daily 03/03/15 0742 03/08/15 1410      Assessment/Plan: s/p Procedure(s): LAPAROSCOPIC GASTROJEJUNOSTOMY No apparent postoperative complications. I think constipation is his main issue. He is on Mira lax but we will increase this regimen.   LOS: 12 days    Shanelle Clontz T 03/13/2015

## 2015-03-14 DIAGNOSIS — R110 Nausea: Secondary | ICD-10-CM

## 2015-03-14 MED ORDER — ONDANSETRON HCL 4 MG/2ML IJ SOLN
4.0000 mg | Freq: Four times a day (QID) | INTRAMUSCULAR | Status: DC | PRN
  Administered 2015-03-15: 4 mg via INTRAVENOUS
  Filled 2015-03-14: qty 2

## 2015-03-14 MED ORDER — LACTATED RINGERS IV BOLUS (SEPSIS): 1000.0000 mL | Freq: Three times a day (TID) | INTRAVENOUS | Status: DC | PRN

## 2015-03-14 MED ORDER — SACCHAROMYCES BOULARDII 250 MG PO CAPS
250.0000 mg | ORAL_CAPSULE | Freq: Two times a day (BID) | ORAL | Status: DC
  Administered 2015-03-14 – 2015-03-16 (×4): 250 mg via ORAL
  Filled 2015-03-14 (×5): qty 1

## 2015-03-14 NOTE — Progress Notes (Signed)
[facility] PROGRESS NOTE  James [name] WCB:[id number] DOB: 03/14/1928 [name]: 03/01/2015 PCP: [name], MD  Interim summary James [name] is a pleasant 81 yo male with history of coronary artery disease, diastolic heart failure, chronic anemia due to chronic blood loss, portal vein thrombosis (previously on anticoagulation was stopped by his hematologist Dr Beryle Beams), 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 Patient with history of coronary artery disease, previously on dual agent antiplatelet therapy with drug eluded stent placed in July, but this was held at the time of admission in anticipation of surgical procedures. He had a CT scan of abdomen and pelvis performed on 03/01/2015 showing bulky duodenal mass with associated lymphadenopathy as well as findings suggestive of hepatic metastatic disease. GI perfomed EGD with biopsy on 03/03/15 that showed fungating soft and friable mass in the duodenal involvement with significant luminal narrowing. 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 General surgery was consulted as he underwent gastro-jejunostomy on 03/09/2015. He tolerated procedure well. With regard to antiplatelet therapy cardiology recommended restarting aspirin therapy, did not recommend restarting Plavix. He had a second generation drug-eluting stent placed greater than 6 months ago.   Assessment/Plan: 1. Duodenal mass/poorly differentiated high-grade carcinoma -CT scan of abdomen and pelvis performed on 03/01/2015 showing bulky duodenal mass wasn't thickened adjacent right upper quadrant lymphadenopathy. Also noted were hepatic metastatic disease, retroperitoneal lymphadenopathy and portal vein thrombosis. Possible metastatic disease involving left iliopsoas muscle. -Patient undergoing EGD on 03/03/2015 that showed fungating soft and friable mass in the duodenal bulb with significant luminal narrowing, multiple  biopsies were taken. 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 surgery performing gastro-jejunostomy today, he tolerated procedure well, there where no complications.  -Medical oncology following, will further discuss treatment options in the outpatient setting next week when recovered from surgery.  -Has been started on soft diet by surgery but still not able to tolerate this due to nausea and lack of appetite   - he is severely constipated and relieving this may improve his nausea and appetite  2.  Coronary artery disease. -Patient with history of coronary artery disease, status post cardiac catheterization on 06/22/2014 undergoing percutaneous intervention to mid LAD lesion with a drug-eluting stent. -His aspirin was restarted postoperatively. Having a second generation DES and after being on plavix for at least 6 months, Plavix was not restarted by Cards.   3.  Chronic diastolic congestive heart failure -Last transthoracic echocardiogram performed 12/08/2013 that showed preserved ejection fraction of 60-65% -His diuretics were restarted on 03/12/2015   4.  Urinary tract infection. -Urine culture from 03/02/2015 showing no growth -IV Levaquin stopped on 03/08/2015  5.  Hypothyroidism. -Continue Synthroid  6. Constipation -Patient complaining of abdominal cramping, which in part could related to constipation -Surgery order soapsuds enema today-per RN the patient had a couple of large bowel movements  Code Status: Full code Family Communication:  Disposition Plan: s/p surgical intervention, pt consultation, anticipate discharge home when medically stable   Consultants:  General surgery  Medical oncology  GI  Procedures:  EGD  Laparoscopic Gastrojejunostomy performed on 03/09/2015   HPI/Subjective: Continues to have nausea and has not had a bowel movement in over a week.  Objective: Filed Vitals:   03/14/15 1400  BP: 119/57   Pulse: 68  Temp: 98.2 F (36.8 C)  Resp: 18    Intake/Output Summary (Last 24 hours) at 03/14/15 1647 Last data filed at 03/14/15 0745  Gross per 24 hour  Intake    400 ml  Output      0 ml  Net    400 ml   Filed Weights   03/05/15 0834 03/07/15 2238 03/14/15 1001  Weight: 104.781 kg (231 lb) 103.5 kg (228 lb 2.8 oz) 104.4 kg (230 lb 2.6 oz)    Exam:   General:  Patient is in no acute distress, he is awake and alert oriented  Cardiovascular: Regular rate and rhythm normal S1-S2  Respiratory: Normal respiratory effort, lungs are clear to auscultation bilaterally no wheezing rhonchi or rales  Abdomen: Incision sites appear clean, there was mild tenderness to palpation across abdomen. No peritoneal signs. Bowel sounds are active, mild distention  Musculoskeletal: Mild bilateral lower extremity pitting edema-cyanosis or clubbing  Data Reviewed: Basic Metabolic Panel:  Recent Labs Lab 03/08/15 0510 03/10/15 0508 03/13/15 0513  NA 136 136 134*  K 4.3 4.6 4.7  CL 100 104 99  CO2 26 23 25   GLUCOSE 101* 178* 104*  BUN 16 15 12   CREATININE 1.03 1.05 1.04  CALCIUM 8.8 9.0 8.8   Liver Function Tests: No results for input(s): AST, ALT, ALKPHOS, BILITOT, PROT, ALBUMIN in the last 168 hours. No results for input(s): LIPASE, AMYLASE in the last 168 hours. No results for input(s): AMMONIA in the last 168 hours. CBC:  Recent Labs Lab 03/08/15 0510 03/10/15 0508 03/13/15 0513  WBC 8.3 8.8 9.3  HGB 9.4* 9.4* 10.1*  HCT 30.1* 30.4* 31.7*  MCV 88.5 89.9 90.6  PLT 331 297 288   Cardiac Enzymes: No results for input(s): CKTOTAL, CKMB, CKMBINDEX, TROPONINI in the last 168 hours. BNP (last 3 results) No results for input(s): BNP in the last 8760 hours.  ProBNP (last 3 results)  Recent Labs  05/29/14 1910 06/06/14 1244 11/29/14 0944  PROBNP 36.3 83.0 114.6    CBG: No results for input(s): GLUCAP in the last 168 hours.  Recent Results (from the past 240  hour(s))  Surgical pcr screen     Status: None   Collection Time: 03/07/15  2:20 PM  Result Value Ref Range Status   MRSA, PCR NEGATIVE NEGATIVE Final   Staphylococcus aureus NEGATIVE NEGATIVE Final    Comment:        The Xpert SA Assay (FDA approved for NASAL specimens in patients over 61 years of age), is one component of a comprehensive surveillance program.  Test performance has been validated by [facility] for patients greater than or equal to 29 year old. It is not intended to diagnose infection nor to guide or monitor treatment.      Studies: No results found.  Scheduled Meds: . allopurinol  150 mg Oral Daily  . antiseptic oral rinse  7 mL Mouth Rinse q12n4p  . aspirin  81 mg Oral Daily  . bisacodyl  5 mg Oral BID  . chlorhexidine  15 mL Mouth Rinse BID  . feeding supplement (ENSURE COMPLETE)  237 mL Oral BID BM  . finasteride  5 mg Oral Daily  . furosemide  40 mg Oral Daily  . iron polysaccharides  150 mg Oral BID  . levothyroxine  25 mcg Oral QAC breakfast  . morphine  15 mg Oral Q12H  . pantoprazole (PROTONIX) IV  40 mg Intravenous Q24H  . polyethylene glycol  17 g Oral BID  . pravastatin  80 mg Oral q1800  . spironolactone  25 mg Oral Daily   Continuous Infusions:    Time spent: 15 minutes    Sinai Mahany  [facility] Pager: [email or web address], password [facility] 03/14/2015, 4:47 PM  LOS: 13 days

## 2015-03-14 NOTE — Progress Notes (Signed)
Soap suds enema given. Resulted in large black stool x2.

## 2015-03-14 NOTE — Progress Notes (Signed)
5 Days Post-Op  Subjective: His only complaint is his constipation.  His sites look fine, he says he can't eat much because of the constipation.  No other compalints Objective: Vital signs in last 24 hours: Temp:  [97.8 F (36.6 C)-98.6 F (37 C)] 97.8 F (36.6 C) (03/22 0624) Pulse Rate:  [60-73] 60 (03/22 0624) Resp:  [18-20] 18 (03/22 0624) BP: (104-114)/(54-56) 111/56 mmHg (03/22 0624) SpO2:  [95 %-97 %] 96 % (03/22 0624) Last BM Date: 03/11/15 840 PO  Full liquids No Bm recorded Afebrile, VSS Labs OK Intake/Output from previous day: 03/21 0701 - 03/22 0700 In: 840 [P.O.:840] Out: 650 [Urine:650] Intake/Output this shift: Total I/O In: 400 [P.O.:400] Out: -   General appearance: alert, cooperative and no distress GI: soft, non-tender; bowel sounds normal; no masses,  no organomegaly Port sites all look fine. Lab Results:   Recent Labs  03/13/15 0513  WBC 9.3  HGB 10.1*  HCT 31.7*  PLT 288    BMET  Recent Labs  03/13/15 0513  NA 134*  K 4.7  CL 99  CO2 25  GLUCOSE 104*  BUN 12  CREATININE 1.04  CALCIUM 8.8   PT/INR No results for input(s): LABPROT, INR in the last 72 hours.  No results for input(s): AST, ALT, ALKPHOS, BILITOT, PROT, ALBUMIN in the last 168 hours.   Lipase     Component Value Date/Time   LIPASE 21 03/01/2015 1500     Studies/Results: No results found.  Medications: . allopurinol  150 mg Oral Daily  . antiseptic oral rinse  7 mL Mouth Rinse q12n4p  . aspirin  81 mg Oral Daily  . bisacodyl  5 mg Oral BID  . chlorhexidine  15 mL Mouth Rinse BID  . feeding supplement (ENSURE COMPLETE)  237 mL Oral BID BM  . finasteride  5 mg Oral Daily  . furosemide  40 mg Oral Daily  . iron polysaccharides  150 mg Oral BID  . levothyroxine  25 mcg Oral QAC breakfast  . morphine  15 mg Oral Q12H  . ondansetron  4 mg Intravenous Q6H  . pantoprazole (PROTONIX) IV  40 mg Intravenous Q24H  . polyethylene glycol  17 g Oral BID  .  pravastatin  80 mg Oral q1800  . spironolactone  25 mg Oral Daily     Assessment/Plan 1. Duodenal mass with liver metastasis, with new gastric outlet obstruction S/p EGD with biopsy 03/03/15 Dr. Zenovia Jarred Pathology:  Duodenal Ampulla/Bulb, r/o cancer - POORLY DIFFERENTIATED CARCINOMA. 2. History of Melanoma 3. Papillary urothelial cancer  4. CAD/ hx of DES/Aortic stenosis/hx of chronic diastolic CHF (last stress test 06/18/15 shows low risk study EF 74%) CATH 06/22/14: 60-70%LAD, LCIRC aneurysmal, 30% [facility] RCA small. Dr. Loralie Champagne. 5. ITP - thought to be due to septra 6. PORTAL VEIN THROMBOSIS 7. Hypertension 8. Hx of peripheral neuropathy, positional Vertigo 9. Anemia/recurrent GI bleed 10. PCM 11. UTI 12. Hx of OSA 13. GERD 14. Currently no DVT prophylaxis 15. Gout 16. BPH 17. SCD for DVT prophylaxis   Plan:  SSE,and if he does well discuss soft diet. If he is eating and voiding should be able to go home soon. SSE worked and now he would like something more.  Will try soft diet.    LOS: 13 days    Kimba Lottes 03/14/2015

## 2015-03-14 NOTE — Discharge Instructions (Signed)
[facility] ______[facility] °LAPAROSCOPIC SURGERY: POST OP INSTRUCTIONS °Always review your discharge instruction sheet given to you by the facility where your surgery was performed. °IF YOU HAVE DISABILITY OR FAMILY LEAVE FORMS, YOU MUST BRING THEM TO THE OFFICE FOR PROCESSING.   °DO NOT GIVE THEM TO YOUR DOCTOR. ° °1. A prescription for pain medication may be given to you upon discharge.  Take your pain medication as prescribed, if needed.  If narcotic pain medicine is not needed, then you may take acetaminophen (Tylenol) or ibuprofen (Advil) as needed. °2. Take your usually prescribed medications unless otherwise directed. °3. If you need a refill on your pain medication, please contact your pharmacy.  They will contact our office to request authorization. Prescriptions will not be filled after 5pm or on week-ends. °4. You should follow a light diet the first few days after arrival home, such as soup and crackers, etc.  Be sure to include lots of fluids daily. °5. Most patients will experience some swelling and bruising in the area of the incisions.  Ice packs will help.  Swelling and bruising can take several days to resolve.  °6. It is common to experience some constipation if taking pain medication after surgery.  Increasing fluid intake and taking a stool softener (such as Colace) will usually help or prevent this problem from occurring.  A mild laxative (Milk of Magnesia or Miralax) should be taken according to package instructions if there are no bowel movements after 48 hours. °7. Unless discharge instructions indicate otherwise, you may remove your bandages 24-48 hours after surgery, and you may shower at that time.  You may have steri-strips (small skin tapes) in place directly over the incision.  These strips should be left on the skin for 7-10 days.  If your surgeon used skin glue on the incision, you may shower in 24 hours.  The glue will flake off over the next 2-3 weeks.  Any sutures or  staples will be removed at the office during your follow-up visit. °8. ACTIVITIES:  You may resume regular (light) daily activities beginning the next day--such as daily self-care, walking, climbing stairs--gradually increasing activities as tolerated.  You may have sexual intercourse when it is comfortable.  Refrain from any heavy lifting or straining until approved by your doctor. °a. You may drive when you are no longer taking prescription pain medication, you can comfortably wear a seatbelt, and you can safely maneuver your car and apply brakes. °b. RETURN TO WORK:  __________________________________________________________ °9. You should see your doctor in the office for a follow-up appointment approximately 2-3 weeks after your surgery.  Make sure that you call for this appointment within a day or two after you arrive home to insure a convenient appointment time. °10. OTHER INSTRUCTIONS: __________________________________________________________________________________________________________________________ __________________________________________________________________________________________________________________________ °WHEN TO CALL YOUR DOCTOR: °1. Fever over 101.0 °2. Inability to urinate °3. Continued bleeding from incision. °4. Increased pain, redness, or drainage from the incision. °5. Increasing abdominal pain ° °The clinic staff is available to answer your questions during regular business hours.  Please don’t hesitate to call and ask to speak to one of the nurses for clinical concerns.  If you have a medical emergency, go to the nearest emergency room or call 911.  A surgeon from [facility] is always on call at the hospital. °[address], [address], [place], [place]  [address] ? [address] [address], [place], [place]   [address] °[phone] ? [phone] ? FAX [phone] °Web site:   [email or web address] °

## 2015-03-15 DIAGNOSIS — E43 Unspecified severe protein-calorie malnutrition: Secondary | ICD-10-CM

## 2015-03-15 LAB — CREATININE, SERUM
Creatinine, Ser: 1.04 mg/dL (ref 0.50–1.35)
GFR, EST AFRICAN AMERICAN: 74 mL/min — AB (ref >90)
GFR, EST NON AFRICAN AMERICAN: 64 mL/min — AB (ref >90)

## 2015-03-15 MED ORDER — ENSURE ENLIVE PO LIQD
237.0000 mL | Freq: Three times a day (TID) | ORAL | Status: DC
  Administered 2015-03-16: 237 mL via ORAL

## 2015-03-15 MED ORDER — PANTOPRAZOLE SODIUM 40 MG PO TBEC
40.0000 mg | DELAYED_RELEASE_TABLET | Freq: Every day | ORAL | Status: DC
  Administered 2015-03-15 – 2015-03-16 (×2): 40 mg via ORAL
  Filled 2015-03-15 (×2): qty 1

## 2015-03-15 MED ORDER — ONDANSETRON HCL 4 MG PO TABS: 4.0000 mg | ORAL_TABLET | Freq: Four times a day (QID) | ORAL | Status: DC | PRN

## 2015-03-15 MED ORDER — OXYCODONE HCL 5 MG PO TABS: 5.0000 mg | ORAL_TABLET | ORAL | Status: DC | PRN

## 2015-03-15 MED ORDER — METOCLOPRAMIDE HCL 5 MG PO TABS
5.0000 mg | ORAL_TABLET | Freq: Three times a day (TID) | ORAL | Status: DC
  Administered 2015-03-15 – 2015-03-16 (×4): 5 mg via ORAL
  Filled 2015-03-15 (×7): qty 1

## 2015-03-15 MED ORDER — ACETAMINOPHEN 500 MG PO TABS
1000.0000 mg | ORAL_TABLET | Freq: Three times a day (TID) | ORAL | Status: DC
  Administered 2015-03-15 – 2015-03-16 (×3): 1000 mg via ORAL
  Filled 2015-03-15 (×5): qty 2

## 2015-03-15 MED ORDER — OXYCODONE-ACETAMINOPHEN 5-325 MG PO TABS
1.0000 | ORAL_TABLET | ORAL | Status: DC | PRN
Start: 2015-03-15 — End: 2015-03-15
  Administered 2015-03-15: 1 via ORAL
  Filled 2015-03-15: qty 1

## 2015-03-15 NOTE — Progress Notes (Signed)
6 Days Post-Op  Subjective: He was not able to eat a soft diet last PM it was to salty for him.  He had an egg and wants to vomit now.  Port sites all look good.  Objective: Vital signs in last 24 hours: Temp:  [98.2 F (36.8 C)-98.3 F (36.8 C)] 98.2 F (36.8 C) (03/23 1610) Pulse Rate:  [62-68] 62 (03/23 0611) Resp:  [18-20] 20 (03/23 0611) BP: (109-119)/(45-57) 109/45 mmHg (03/23 0611) SpO2:  [95 %-96 %] 95 % (03/23 0611) Weight:  [101.6 kg (223 lb 15.8 oz)-102.286 kg (225 lb 8 oz)] 102.286 kg (225 lb 8 oz) (03/23 0859) Last BM Date: 03/14/15 400 PO recorded,  2 BM yesterday and one this AM recorded. Afebrile, VSS, Creatinine is normal today, labs normal yesterday  soft diet _  Intake/Output from previous day: 03/22 0701 - 03/23 0700 In: 400 [P.O.:400] Out: -  Intake/Output this shift: Total I/O In: 240 [P.O.:240] Out: -   General appearance: alert, cooperative, no distress and complains of nausea and bucket is nearby. Resp: clear to auscultation bilaterally GI: soft, normal soreness, incisions/ports all look good.  he has had a BM this AM.    Lab Results:   Recent Labs  03/13/15 0513  WBC 9.3  HGB 10.1*  HCT 31.7*  PLT 288    BMET  Recent Labs  03/13/15 0513 03/15/15 0510  NA 134*  --   K 4.7  --   CL 99  --   CO2 25  --   GLUCOSE 104*  --   BUN 12  --   CREATININE 1.04 1.04  CALCIUM 8.8  --    PT/INR No results for input(s): LABPROT, INR in the last 72 hours.  No results for input(s): AST, ALT, ALKPHOS, BILITOT, PROT, ALBUMIN in the last 168 hours.   Lipase     Component Value Date/Time   LIPASE 21 03/01/2015 1500     Studies/Results: No results found.  Medications: . allopurinol  150 mg Oral Daily  . antiseptic oral rinse  7 mL Mouth Rinse q12n4p  . aspirin  81 mg Oral Daily  . bisacodyl  5 mg Oral BID  . chlorhexidine  15 mL Mouth Rinse BID  . feeding supplement (ENSURE COMPLETE)  237 mL Oral BID BM  . finasteride  5 mg Oral  Daily  . furosemide  40 mg Oral Daily  . iron polysaccharides  150 mg Oral BID  . levothyroxine  25 mcg Oral QAC breakfast  . morphine  15 mg Oral Q12H  . pantoprazole (PROTONIX) IV  40 mg Intravenous Q24H  . polyethylene glycol  17 g Oral BID  . pravastatin  80 mg Oral q1800  . saccharomyces boulardii  250 mg Oral BID  . spironolactone  25 mg Oral Daily     Prior to Admission medications   Medication Sig Start Date End Date Taking? Authorizing Provider  allopurinol (ZYLOPRIM) 300 MG tablet Take 0.5 tablets (150 mg total) by mouth every morning. 10/31/14  Yes Tonia Ghent, MD  aspirin EC 81 MG tablet Take 81 mg by mouth every evening.    Yes Historical Provider, MD  Cholecalciferol (VITAMIN D3) 2000 UNITS capsule Take 2,000 Units by mouth every morning.    Yes Historical Provider, MD  clopidogrel (PLAVIX) 75 MG tablet Take 75 mg by mouth every morning.    Yes Historical Provider, MD  finasteride (PROSCAR) 5 MG tablet Take 5 mg by mouth daily.   Yes  Historical Provider, MD  furosemide (LASIX) 40 MG tablet Take 1 tablet (40 mg total) by mouth daily. 02/28/15  Yes Larey Dresser, MD  iron polysaccharides (NIFEREX) 150 MG capsule Take 150 mg by mouth 2 (two) times daily.   Yes Historical Provider, MD  LIVALO 4 MG TABS TAKE 1 TABLET AT BEDTIME 10/03/14  Yes Jolaine Artist, MD  Multiple Vitamin (MULTIVITAMIN WITH MINERALS) TABS tablet Take 1 tablet by mouth daily.   Yes Historical Provider, MD  OVER THE COUNTER MEDICATION Place 2 drops into both eyes 4 (four) times daily. Wash type eye drop-   Yes Historical Provider, MD  oxyCODONE-acetaminophen (ROXICET) 5-325 MG per tablet Take 1 tablet by mouth every 4 (four) hours as needed for severe pain. 02/03/15  Yes Carolan Clines, MD  pantoprazole (PROTONIX) 40 MG tablet Take 40 mg by mouth at bedtime.   Yes Historical Provider, MD  phenazopyridine (PYRIDIUM) 200 MG tablet Take 1 tablet (200 mg total) by mouth 3 (three) times daily as needed for  pain. 02/03/15  Yes Carolan Clines, MD  Probiotic Product (PROBIOTIC & ACIDOPHILUS EX ST PO) Take 1 tablet by mouth every morning.    Yes Historical Provider, MD  Psyllium (METAMUCIL PO) Take 20 mLs by mouth every evening.   Yes Historical Provider, MD  ranitidine (ZANTAC) 150 MG tablet Take 150 mg by mouth at bedtime.   Yes Historical Provider, MD  spironolactone (ALDACTONE) 25 MG tablet Take 1 tablet (25 mg total) by mouth daily. 10/26/14  Yes Larey Dresser, MD  furosemide (LASIX) 40 MG tablet Take 1 tablet (40 mg total) by mouth daily. 03/02/15   Jolaine Artist, MD  nitroGLYCERIN (NITROSTAT) 0.4 MG SL tablet Place 1 tablet (0.4 mg total) under the tongue every 5 (five) minutes as needed. For chest pain 07/07/14   Larey Dresser, MD     Assessment/Plan 1. Duodenal mass with liver metastasis, with new gastric outlet obstruction S/p EGD with biopsy 03/03/15 Dr. Zenovia Jarred Pathology: Duodenal Ampulla/Bulb, r/o cancer - POORLY DIFFERENTIATED CARCINOMA. 2. History of Melanoma 3. Papillary urothelial cancer  4. CAD/ hx of DES/Aortic stenosis/hx of chronic diastolic CHF (last stress test 06/18/15 shows low risk study EF 74%) CATH 06/22/14: 60-70%LAD, LCIRC aneurysmal, 30% [facility] RCA small. Dr. Loralie Champagne. 5. ITP - thought to be due to septra 6. PORTAL VEIN THROMBOSIS 7. Hypertension 8. Hx of peripheral neuropathy, positional Vertigo 9. Anemia/recurrent GI bleed 10. PCM 11. UTI 12. Hx of OSA 13. GERD 14. Currently no DVT prophylaxis 15. Gout 16. BPH 17. SCD for DVT prophylaxis   Plan:  He would like to go home but isn't ready, feels like he wants to vomit.  He was placed on MS contin, but not having that much pain, and has not used last 2 times, asking for something else.  So I will stop it.  Leave percocet and tylenol. Recheck labs in AM.  If nausea resolved, and he is tolerating diet he may be able to go home at that time.      LOS: 14 days     James [name] 03/15/2015

## 2015-03-15 NOTE — Progress Notes (Addendum)
[facility] PROGRESS NOTE  Tashi Band Hamed TDV:[id number] DOB: 1928/04/16 [name]: 03/01/2015 PCP: [name], MD  Interim summary Banner Huckaba is a pleasant 81 yo male with history of coronary artery disease, diastolic heart failure, chronic anemia due to chronic blood loss, portal vein thrombosis (previously on anticoagulation was stopped by his hematologist Dr Beryle Beams), 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 Patient with history of coronary artery disease, previously on dual agent antiplatelet therapy with drug eluded stent placed in July, but this was held at the time of admission in anticipation of surgical procedures. He had a CT scan of abdomen and pelvis performed on 03/01/2015 showing bulky duodenal mass with associated lymphadenopathy as well as findings suggestive of hepatic metastatic disease. GI perfomed EGD with biopsy on 03/03/15 that showed fungating soft and friable mass in the duodenal involvement with significant luminal narrowing. 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 General surgery was consulted as he underwent gastro-jejunostomy on 03/09/2015. He tolerated procedure well. With regard to antiplatelet therapy cardiology recommended restarting aspirin therapy, did not recommend restarting Plavix. He had a second generation drug-eluting stent placed greater than 6 months ago.   HPI/Subjective: Had nausea after taking solids for breakfast. No vomiting.   Assessment/Plan: 1. Duodenal mass/poorly differentiated high-grade carcinoma -CT scan of abdomen and pelvis performed on 03/01/2015 showing bulky duodenal mass wasn't thickened adjacent right upper quadrant lymphadenopathy. Also noted were hepatic metastatic disease, retroperitoneal lymphadenopathy and portal vein thrombosis. Possible metastatic disease involving left iliopsoas muscle. -Patient undergoing EGD on 03/03/2015 that showed fungating soft and  friable mass in the duodenal bulb with significant luminal narrowing, multiple biopsies were taken. 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 surgery performing gastro-jejunostomy today, he tolerated procedure well, there where no complications.  -Medical oncology following, will further discuss treatment options in the outpatient setting next week when recovered from surgery.  -Has been started on soft diet by surgery but still not able to tolerate this due to nausea and lack of appetite    - he was severely constipated but an enema helped resolve this on 3/22  2.  Coronary artery disease. -Patient with history of coronary artery disease, status post cardiac catheterization on 06/22/2014 undergoing percutaneous intervention to mid LAD lesion with a drug-eluting stent. -His aspirin was restarted postoperatively. Having a second generation DES and after being on plavix for at least 6 months, Plavix was not restarted by Cards.   3.  Chronic diastolic congestive heart failure -Last transthoracic echocardiogram performed 12/08/2013 that showed preserved ejection fraction of 60-65% -His diuretics were restarted on 03/12/2015   4.  Urinary tract infection. -Urine culture from 03/02/2015 showing no growth -IV Levaquin stopped on 03/08/2015  5.  Hypothyroidism. -Continue Synthroid  6. Constipation -Patient complaining of abdominal cramping, which in part could related to constipation -Surgery order soapsuds enema -per RN the patient had a couple of large bowel movements  Code Status: Full code Family Communication:  Disposition Plan: s/p surgical intervention, pt consultation, anticipate discharge home when medically stable   Consultants:  General surgery  Medical oncology  GI  Procedures:  EGD  Laparoscopic Gastrojejunostomy performed on 03/09/2015     Objective: Filed Vitals:   03/15/15 0611  BP: 109/45  Pulse: 62  Temp: 98.2 F  (36.8 C)  Resp: 20    Intake/Output Summary (Last 24 hours) at 03/15/15 1318 Last data filed at 03/15/15 0900  Gross per 24 hour  Intake  240 ml  Output      0 ml  Net    240 ml   Filed Weights   03/14/15 1001 03/15/15 0611 03/15/15 0859  Weight: 104.4 kg (230 lb 2.6 oz) 101.6 kg (223 lb 15.8 oz) 102.286 kg (225 lb 8 oz)    Exam:   General:  Patient is in no acute distress, he is awake and alert oriented  Cardiovascular: Regular rate and rhythm normal S1-S2  Respiratory: Normal respiratory effort, lungs are clear to auscultation bilaterally no wheezing rhonchi or rales  Abdomen: Incision sites appear clean, there was mild tenderness to palpation across abdomen. No peritoneal signs. Bowel sounds are active, mild distention  Musculoskeletal: Mild bilateral lower extremity pitting edema-cyanosis or clubbing  Data Reviewed: Basic Metabolic Panel:  Recent Labs Lab 03/10/15 0508 03/13/15 0513 03/15/15 0510  NA 136 134*  --   K 4.6 4.7  --   CL 104 99  --   CO2 23 25  --   GLUCOSE 178* 104*  --   BUN 15 12  --   CREATININE 1.05 1.04 1.04  CALCIUM 9.0 8.8  --    Liver Function Tests: No results for input(s): AST, ALT, ALKPHOS, BILITOT, PROT, ALBUMIN in the last 168 hours. No results for input(s): LIPASE, AMYLASE in the last 168 hours. No results for input(s): AMMONIA in the last 168 hours. CBC:  Recent Labs Lab 03/10/15 0508 03/13/15 0513  WBC 8.8 9.3  HGB 9.4* 10.1*  HCT 30.4* 31.7*  MCV 89.9 90.6  PLT 297 288   Cardiac Enzymes: No results for input(s): CKTOTAL, CKMB, CKMBINDEX, TROPONINI in the last 168 hours. BNP (last 3 results) No results for input(s): BNP in the last 8760 hours.  ProBNP (last 3 results)  Recent Labs  05/29/14 1910 06/06/14 1244 11/29/14 0944  PROBNP 36.3 83.0 114.6    CBG: No results for input(s): GLUCAP in the last 168 hours.  Recent Results (from the past 240 hour(s))  Surgical pcr screen     Status: None    Collection Time: 03/07/15  2:20 PM  Result Value Ref Range Status   MRSA, PCR NEGATIVE NEGATIVE Final   Staphylococcus aureus NEGATIVE NEGATIVE Final    Comment:        The Xpert SA Assay (FDA approved for NASAL specimens in patients over 50 years of age), is one component of a comprehensive surveillance program.  Test performance has been validated by [facility] for patients greater than or equal to 79 year old. It is not intended to diagnose infection nor to guide or monitor treatment.      Studies: No results found.  Scheduled Meds: . acetaminophen  1,000 mg Oral TID  . allopurinol  150 mg Oral Daily  . antiseptic oral rinse  7 mL Mouth Rinse q12n4p  . aspirin  81 mg Oral Daily  . bisacodyl  5 mg Oral BID  . chlorhexidine  15 mL Mouth Rinse BID  . feeding supplement (ENSURE COMPLETE)  237 mL Oral BID BM  . finasteride  5 mg Oral Daily  . furosemide  40 mg Oral Daily  . iron polysaccharides  150 mg Oral BID  . levothyroxine  25 mcg Oral QAC breakfast  . pantoprazole  40 mg Oral Q1200  . polyethylene glycol  17 g Oral BID  . pravastatin  80 mg Oral q1800  . saccharomyces boulardii  250 mg Oral BID  . spironolactone  25 mg Oral Daily   Continuous  Infusions:    Time spent: 20 minutes    [facility] Pager: [email or web address], password [facility] 03/15/2015, 1:18 PM  LOS: 14 days

## 2015-03-15 NOTE — Plan of Care (Signed)
Problem: Food- and Nutrition-Related Knowledge Deficit (NB-1.1) Goal: Nutrition education Formal process to instruct or train a patient/client in a skill or to impart knowledge to help patients/clients voluntarily manage or modify food choices and eating behavior to maintain or improve health. Outcome: Completed/Met Date Met:  03/15/15 Nutrition Education Note  Received consult for diet education for pt s/p gastric resection.  Discussed 6-8 week post op diet with pt. Focused on:  - Importance of small, frequent meals. - Separating meals from liquids.  - Choosing foods and liquids low in sugar.  - Avoiding raw fruits and vegetables - Choosing foods that are well-cooked, soft and easy to digest.  - Avoidance of caffeinated beverages - Having protein with every meal and using nutritional supplements to increase protein intake.   James [name] [place], [place], [place]

## 2015-03-16 ENCOUNTER — Other Ambulatory Visit: Payer: Self-pay | Admitting: *Deleted

## 2015-03-16 DIAGNOSIS — C170 Malignant neoplasm of duodenum: Secondary | ICD-10-CM

## 2015-03-16 LAB — CBC
HCT: 30.8 % — ABNORMAL LOW (ref 39.0–52.0)
HEMOGLOBIN: 9.4 g/dL — AB (ref 13.0–17.0)
MCH: 27.7 pg (ref 26.0–34.0)
MCHC: 30.5 g/dL (ref 30.0–36.0)
MCV: 90.9 fL (ref 78.0–100.0)
Platelets: 321 10*3/uL (ref 150–400)
RBC: 3.39 MIL/uL — AB (ref 4.22–5.81)
RDW: 23.1 % — ABNORMAL HIGH (ref 11.5–15.5)
WBC: 10.6 10*3/uL — ABNORMAL HIGH (ref 4.0–10.5)

## 2015-03-16 LAB — BASIC METABOLIC PANEL
ANION GAP: 12 (ref 5–15)
BUN: 21 mg/dL (ref 6–23)
CHLORIDE: 97 mmol/L (ref 96–112)
CO2: 27 mmol/L (ref 19–32)
Calcium: 8.8 mg/dL (ref 8.4–10.5)
Creatinine, Ser: 1.08 mg/dL (ref 0.50–1.35)
GFR calc non Af Amer: 61 mL/min — ABNORMAL LOW (ref >90)
GFR, EST AFRICAN AMERICAN: 71 mL/min — AB (ref >90)
Glucose, Bld: 107 mg/dL — ABNORMAL HIGH (ref 70–99)
Potassium: 4.1 mmol/L (ref 3.5–5.1)
SODIUM: 136 mmol/L (ref 135–145)

## 2015-03-16 MED ORDER — DRONABINOL 2.5 MG PO CAPS: 2.5000 mg | ORAL_CAPSULE | Freq: Two times a day (BID) | ORAL | Status: DC

## 2015-03-16 MED ORDER — ENSURE ENLIVE PO LIQD: 237.0000 mL | Freq: Three times a day (TID) | ORAL | Status: AC

## 2015-03-16 MED ORDER — SALINE SPRAY 0.65 % NA SOLN
1.0000 | NASAL | Status: DC | PRN
  Administered 2015-03-16: 1 via NASAL
  Filled 2015-03-16: qty 44

## 2015-03-16 MED ORDER — SALINE SPRAY 0.65 % NA SOLN: 1.0000 | NASAL | Status: DC | PRN

## 2015-03-16 NOTE — Progress Notes (Signed)
Patient ID: James [name], male   DOB: May 03, 1928, 81 y.o.   MRN: [id number]     [name]      [name] [place], [place] [address]    Phone: [phone] FAX: [phone]     Subjective: No n/v.  Pain controlled.   Objective:  Vital signs:  Filed Vitals:   03/15/15 2212 03/16/15 0531 03/16/15 0740 03/16/15 0958  BP: 110/47 118/48 113/50 126/53  Pulse: 68 59 59 70  Temp: 98.2 F (36.8 C) 97.9 F (36.6 C) 97.9 F (36.6 C) 98.1 F (36.7 C)  TempSrc: Oral Oral Oral Oral  Resp: _0 Height:      Weight:  100.4 kg (221 lb 5.5 oz)    SpO2: 96% 95% 97% 99%    Last BM Date: 03/16/15  Intake/Output   Yesterday:  03/23 0701 - 03/24 0700 In: 240 [P.O.:240] Out: -  This shift:  Total I/O In: 120 [P.O.:120] Out: -    Physical Exam: General: Pt awake/alert/oriented x4 in no acute distress  Abdomen: Soft.  Nondistended.  Incisions are c/d/i.  Mildly tender at incisions only.  No evidence of peritonitis.  No incarcerated hernias.    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 Active Problems:   HYPERTENSION, BENIGN   CAD, NATIVE VESSEL   Chronic diastolic CHF (congestive heart failure)   Portal vein thrombosis   Anemia due to chronic blood loss   Abdominal pain   Hepatic metastases   Liver mass   Cancer related pain   CHF (congestive heart failure)   Protein-calorie malnutrition, severe   Hypothyroidism   UTI (urinary tract infection)   Duodenal obstruction, acquired   History of DVT of lower extremity    Results:   Labs: Results for orders placed or performed during the hospital encounter of 03/01/15 (from the past 48 hour(s))  Creatinine, serum     Status: Abnormal   Collection Time: 03/15/15  5:10 AM  Result Value Ref Range   Creatinine, Ser 1.04 0.50 - 1.35 mg/dL   GFR calc non Af Amer 64 (L) >90 mL/min   GFR calc Af Amer 74 (L) >90 mL/min   Comment: (NOTE) The eGFR has been calculated using the CKD EPI equation. This calculation has not been validated in all clinical situations. eGFR's persistently <90 mL/min signify possible Chronic Kidney Disease.   CBC     Status: Abnormal   Collection Time: 03/16/15  5:20 AM  Result Value Ref Range   WBC 10.6 (H) 4.0 - 10.5 K/uL   RBC 3.39 (L) 4.22 - 5.81 MIL/uL   Hemoglobin 9.4 (L) 13.0 - 17.0 g/dL   HCT 30.8 (L) 39.0 - 52.0 %   MCV 90.9 78.0 - 100.0 fL   MCH 27.7 26.0 - 34.0 pg   MCHC 30.5 30.0 - 36.0 g/dL   RDW 23.1 (H) 11.5 - 15.5 %   Platelets 321 150 - 400 K/uL  Basic metabolic panel     Status: Abnormal   Collection Time: 03/16/15  5:20 AM  Result Value Ref Range   Sodium 136 135 - 145 mmol/L   Potassium 4.1 3.5 - 5.1 mmol/L   Chloride 97 96 - 112 mmol/L   CO2 27 19 - 32 mmol/L   Glucose, Bld 107 (H) 70 - 99 mg/dL   BUN 21 6 - 23 mg/dL   Creatinine, Ser 1.08 0.50 - 1.35 mg/dL  Calcium 8.8 8.4 - 10.5 mg/dL   GFR calc non Af Amer 61 (L) >90 mL/min   GFR calc Af Amer 71 (L) >90 mL/min    Comment: (NOTE) The eGFR has been calculated using the CKD EPI equation. This calculation has not been validated in all clinical situations. eGFR's persistently <90 mL/min signify possible Chronic Kidney Disease.    Anion gap 12 5 - 15    Imaging / Studies: No results found.  Medications / Allergies:  Scheduled Meds: . acetaminophen  1,000 mg Oral TID  . allopurinol  150 mg Oral Daily  . antiseptic oral rinse  7 mL Mouth Rinse q12n4p  . aspirin  81 mg Oral Daily  . bisacodyl  5 mg Oral BID  . chlorhexidine  15 mL Mouth Rinse BID  . feeding supplement (ENSURE ENLIVE)  237 mL Oral TID WC  . finasteride  5 mg Oral Daily  . furosemide  40 mg Oral Daily  . iron polysaccharides  150 mg Oral BID  . levothyroxine  25 mcg Oral QAC breakfast  . metoCLOPramide  5 mg Oral TID AC & HS  . pantoprazole  40 mg Oral Q1200  . polyethylene glycol  17 g Oral BID  . pravastatin  80 mg Oral  q1800  . saccharomyces boulardii  250 mg Oral BID  . spironolactone  25 mg Oral Daily   Continuous Infusions:  PRN Meds:.bisacodyl, HYDROmorphone (DILAUDID) injection, lactated ringers, ondansetron, ondansetron, oxyCODONE, sodium chloride  Antibiotics: Anti-infectives    Start     Dose/Rate Route Frequency Ordered Stop   03/08/15 0600  cefoTEtan (CEFOTAN) 2 g in dextrose 5 % 50 mL IVPB     2 g 100 mL/hr over 30 Minutes Intravenous On call to O.R. 03/07/15 1258 03/09/15 0559   03/03/15 0800  levofloxacin (LEVAQUIN) IVPB 750 mg  Status:  Discontinued     750 mg 100 mL/hr over 90 Minutes Intravenous Daily 03/03/15 0742 03/08/15 1410        Assessment/Plan POD#7 laparoscopic gastrojejunostomy for duodenal mass with liver metastasis--Dr. Excell Seltzer  -tolerating PO, ambulating, pain controlled, stable for dc from surgical standpoint.  Erby Pian, [facility] Pager [phone])    03/16/2015 1:11 PM

## 2015-03-16 NOTE — [facility] Oncology Navigator Note (Signed)
According to Dr. Benay Spice, James [name] will be discharged soon from hospital. Need to schedule him for PET scan and hospital follow up appointment. Small bowel cancer

## 2015-03-16 NOTE — Progress Notes (Signed)
James [name] discharged home, with instructions given on medications ,and follow up visits,patient verbalized understanding.Prescriptions sent with patient. No c/o pain or discomfort noted. Staff accompanied patient to awaiting vehicle.

## 2015-03-16 NOTE — Discharge Summary (Signed)
Physician Discharge Summary  James [name] OVF:[id number] DOB: Jun 27, 1928 [name]: 03/01/2015  PCP: [name], MD  Admit date: 03/01/2015 Discharge date: 03/16/2015  Time spent: 45 minutes  Recommendations for Outpatient Follow-up:  1. F/u on appetite issues 2. 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-   Duodenal obstruction, acquired Active Problems:  Hepatic metastases   HYPERTENSION, BENIGN   Protein-calorie malnutrition, severe   CAD, NATIVE VESSEL   Chronic diastolic CHF (congestive heart failure)   Portal vein thrombosis   Anemia due to chronic blood loss   Abdominal pain   Chronic diastolic CHF (congestive heart failure)  UTI (urinary tract infection)   History of DVT of lower extremity   History of present illness:  James [name] is a pleasant 81 yo male with history of coronary artery disease, diastolic heart failure, chronic anemia due to chronic blood loss, portal vein thrombosis (previously on anticoagulation was stopped by his hematologist Dr Beryle Beams), 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 Patient with history of coronary artery disease, previously on dual agent antiplatelet therapy with drug eluded stent placed in July, but this was held at the time of admission in anticipation of surgical procedures. He had a CT scan of abdomen and pelvis performed on 03/01/2015 showing bulky duodenal mass with associated lymphadenopathy as well as findings suggestive of hepatic metastatic disease. GI perfomed EGD with biopsy on 03/03/15 that showed fungating soft and friable mass in the duodenal involvement with significant luminal  narrowing. 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 General surgery was consulted as he underwent gastro-jejunostomy on 03/09/2015. He tolerated procedure well. With regard to antiplatelet therapy cardiology recommended restarting aspirin therapy, did not recommend restarting Plavix. He had a second generation drug-eluting stent placed greater than 6 months ago.    Hospital Course:   Duodenal mass with obstruction/poorly differentiated high-grade carcinoma -CT scan of abdomen and pelvis performed on 03/01/2015 showing bulky duodenal mass wasn't thickened adjacent right upper quadrant lymphadenopathy. Also noted were hepatic metastatic disease, retroperitoneal lymphadenopathy and portal vein thrombosis. Possible metastatic disease involving left iliopsoas muscle. -Patient undergoing EGD on 03/03/2015 that showed fungating soft and friable mass in the duodenal bulb with significant luminal narrowing, multiple biopsies were taken. 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 surgery performing gastro-jejunostomy today, he tolerated procedure well, there where no complications.  -Medical oncology following, will further discuss treatment options in the outpatient setting next week when recovered from surgery.  -Has been started on soft diet by surgery but has no appetite and is only eating a few bites at each meal-see below - he was severely constipated but an enema helped resolve this on 3/22  2. Severe protein calorie malnutrition Due to cancer and recent surgery- have added Ensure TID and Marinol- will need to follow this closely as outpt  3. Coronary artery disease. -Patient with history of coronary artery disease, status post cardiac catheterization on 06/22/2014 undergoing percutaneous intervention to mid LAD lesion with a drug-eluting stent. -His aspirin was restarted postoperatively. Having a second generation DES  and after being on plavix for at least 6 months, Plavix was not restarted by Cards.  4. Chronic diastolic congestive heart failure -Last transthoracic echocardiogram performed 12/08/2013 that showed preserved ejection fraction of 60-65% -His diuretics were restarted on 03/12/2015   5. Urinary tract infection. -Urine culture from 03/02/2015 showing no growth -IV Levaquin stopped on 03/08/2015  6. Hypothyroidism. -Continue Synthroid  7. Constipation -Patient complaining of abdominal cramping, which in part could related to constipation -Surgery order soapsuds enema -per RN the patient had a couple of large bowel movements afterwards    Consultants:  General surgery  Medical oncology  GI  Procedures:  EGD  Laparoscopic Gastrojejunostomy performed on 03/09/2015  Discharge Exam: Filed Weights   03/15/15 2330 03/15/15 0859 03/16/15 0531  Weight: 101.6 kg (223 lb 15.8 oz) 102.286 kg (225 lb 8 oz) 100.4 kg (221 lb 5.5 oz)   Filed Vitals:   03/16/15 0958  BP: 126/53  Pulse: 70  Temp: 98.1 F (36.7 C)  Resp: 16    General: AAO x 3, no distress Cardiovascular: RRR, no murmurs  Respiratory: clear to auscultation bilaterally GI: soft, non-tender, non-distended, bowel sound positive  Discharge Instructions You were cared for by a hospitalist during your hospital stay. If you have any questions about your discharge medications or the care you received while you were in the hospital after you are discharged, you can call the unit and asked to speak with the hospitalist on call if the hospitalist that took care of you is not available. Once you are discharged, your primary care physician will handle any further medical issues. Please note that NO REFILLS for any discharge medications will be authorized once you are discharged, as it is imperative that you return to your primary care physician (or establish a relationship with a primary care physician if you do not have one) for  your aftercare needs so that they can reassess your need for medications and monitor your lab values.  Discharge Instructions    Discharge instructions    Complete by:  As directed   Low sodium diet     Increase activity slowly    Complete by:  As directed             Medication List    TAKE these medications        allopurinol 300 MG tablet  Commonly known as:  ZYLOPRIM  Take 0.5 tablets (150 mg total) by mouth every morning.     aspirin EC 81 MG tablet  Take 81 mg by mouth every evening.     clopidogrel 75 MG tablet  Commonly known as:  PLAVIX  Take 75 mg by mouth every morning.     dronabinol 2.5 MG capsule  Commonly known as:  MARINOL  Take 1 capsule (2.5 mg total) by mouth 2 (two) times daily before lunch and supper.     feeding supplement (ENSURE ENLIVE) Liqd  Take 237 mLs by mouth 3 (three) times daily between meals.     finasteride 5 MG tablet  Commonly known as:  PROSCAR  Take 5 mg by mouth daily.     furosemide 40 MG tablet  Commonly known as:  LASIX  Take 1 tablet (40 mg total) by mouth daily.     iron polysaccharides 150 MG capsule  Commonly known as:  NIFEREX  Take 150 mg by mouth 2 (two) times daily.     LIVALO 4 MG Tabs  Generic drug:  Pitavastatin Calcium  TAKE 1 TABLET AT BEDTIME     METAMUCIL PO  Take 20 mLs by mouth every evening.  multivitamin with minerals Tabs tablet  Take 1 tablet by mouth daily.     nitroGLYCERIN 0.4 MG SL tablet  Commonly known as:  NITROSTAT  Place 1 tablet (0.4 mg total) under the tongue every 5 (five) minutes as needed. For chest pain     OVER THE COUNTER MEDICATION  Place 2 drops into both eyes 4 (four) times daily. Wash type eye drop-     oxyCODONE-acetaminophen 5-325 MG per tablet  Commonly known as:  ROXICET  Take 1 tablet by mouth every 4 (four) hours as needed for severe pain.     pantoprazole 40 MG tablet  Commonly known as:  PROTONIX  Take 40 mg by mouth at bedtime.     phenazopyridine 200  MG tablet  Commonly known as:  PYRIDIUM  Take 1 tablet (200 mg total) by mouth 3 (three) times daily as needed for pain.     PROBIOTIC & ACIDOPHILUS EX ST PO  Take 1 tablet by mouth every morning.     ranitidine 150 MG tablet  Commonly known as:  ZANTAC  Take 150 mg by mouth at bedtime.     spironolactone 25 MG tablet  Commonly known as:  ALDACTONE  Take 1 tablet (25 mg total) by mouth daily.     Vitamin D3 2000 UNITS capsule  Take 2,000 Units by mouth every morning.       Allergies  Allergen Reactions  . Sulfa Antibiotics Other (See Comments)    Caused patient have ITP  . Aleve [Naproxen Sodium] Other (See Comments)    Causes platlet drop  . Rofecoxib Other (See Comments)    Celebrex  - affected low platelets  . Sulfamethoxazole-Trimethoprim Other (See Comments)     Causes low platelets and bleeding  . Tape Other (See Comments)    Adhesive tape makes skin tear.  . Tramadol Hcl Other (See Comments)    dizziness, drowsiness  . Neomycin-Bacitracin Zn-Polymyx Rash  . Neosporin [Neomycin-Polymyxin-Gramicidin] Rash  . Penicillins Swelling and Rash    Took dose of amoxicillin 2000mg  02/02/15 at dentist office without any complications       Follow-up Information    Follow up with [name], MD In 1 week.   Specialty:  Family Medicine   Contact information:   [place] [address] [phone]       Follow up with Edward Jolly, MD. Schedule an appointment as soon as possible for a visit in 2 weeks.   Specialty:  General Surgery   Contact information:   [address] [place] [address] [phone]       Follow up with Betsy Coder, MD.   Specialty:  Oncology   Contact information:   [place] [address] [phone]        The results of significant diagnostics from this hospitalization (including imaging, microbiology, ancillary and laboratory) are listed below for reference.     Significant Diagnostic Studies: Dg Chest 2 View  03/01/2015   CLINICAL DATA:  CHF.  EXAM: CHEST  2 VIEW  COMPARISON:  05/27/2014  FINDINGS: Heart size is normal. Negative for pulmonary edema. Densities at the left lung base and along the left cardiac border appear chronic. Atherosclerotic calcifications in the thoracic aorta. Negative for pleural effusions.  IMPRESSION: No acute chest findings.   Electronically Signed   By: Markus Daft M.D.   On: 03/01/2015 23:44   Ct Abdomen Pelvis W Contrast  03/01/2015  CLINICAL DATA:  Right flank pain which began 10 days ago. Progressively worsening. Bladder cancer diagnosed 02/03/2015  EXAM: CT ABDOMEN AND PELVIS WITH CONTRAST  TECHNIQUE: Multidetector CT imaging of the abdomen and pelvis was performed using the standard protocol following bolus administration of intravenous contrast.  CONTRAST:  124mL OMNIPAQUE IOHEXOL 300 MG/ML SOLN, 50mL OMNIPAQUE IOHEXOL 300 MG/ML SOLN  COMPARISON:  09/24/2014  FINDINGS: Lower chest: The lung bases are clear of acute process. Minimal dependent atelectasis and basilar scarring changes. The heart is normal in size coronary artery calcifications are noted. Moderate aortic calcifications. There is a small hiatal hernia.  Hepatobiliary: The E liver demonstrates a bizarre enhancement pattern due to portal vein thrombosis also demonstrated on the prior study. The right hepatic lobe liver lesions seen on the prior study have coalesced into a large mass occupying the inferior aspect of the right hepatic lobe.  Pancreas: Stable fatty changes.  No mass or inflammation.  Spleen: Normal size.  No focal lesions.  Adrenals/Urinary Tract: Stable right adrenal gland angioma mild lipoma. The kidneys are unremarkable.  Stomach/Bowel: The stomach is unremarkable. There is a large duodenum mass likely accounting for the patient's hepatic metastatic disease. There is also now bulky portal, peripancreatic and celiac axis lymphadenopathy. A necrotic  appearing node measures 4.1 x 2.7 cm on image number 24. The duodenum mass measures approximately 4.3 x 4.3 cm. The small bowel and colon are unremarkable. No inflammation or obstruction.  Vascular/Lymphatic: There is left-sided retroperitoneal lymphadenopathy in addition to the upper abdominal adenopathy surrounding the duodenum. The aorta demonstrates stable atherosclerotic calcifications. No focal aneurysm.  Pelvis: Without obvious discrete bladder mass. Prostate gland is enlarged. The seminal vesicles are unremarkable. No pelvic lymphadenopathy. No inguinal lymphadenopathy. A left inguinal hernia is noted containing fat. There is a lesion in the left ileus psoas muscle suspicious for a muscle metastasis. A liquified hematoma is also possible.  Musculoskeletal: No obvious destructive metastatic bone lesions. There is a stable sclerotic lesion involving the right acetabular. Vague lucencies are noted in the left iliac bone anteriorly.  IMPRESSION: 1. Bulky duodenal mass with significant adjacent right upper quadrant lymphadenopathy. There is also hepatic metastatic disease, retroperitoneal lymphadenopathy and portal vein thrombosis. 2. Possible metastatic lesion involving the left ileal psoas muscle. A liquified hematoma is also possible. 3. Stable small lucencies in the left iliac bone. No obvious osseous metastatic disease.   Electronically Signed   By: Marijo Sanes M.D.   On: 03/01/2015 20:12   Dg Abd 2 Views  03/05/2015   CLINICAL DATA:  Abdominal distention and pain for 10 days. History of bowel obstruction and duodenal mass. Porta hepatis adenopathy and liver mass.  EXAM: ABDOMEN - 2 VIEW  COMPARISON:  03/01/2015  FINDINGS: Orally administered contrast medium is present in the colon. Thoracolumbar spondylosis. No free intraperitoneal gas.  No dilated bowel or abnormal air-fluid levels. Stable mixed sclerotic and lucent lesion of the right acetabular roof, stable.  IMPRESSION: 1. No dilated bowel or  significant abnormal air fluid levels are observed to favor current obstruction. Orally administered contrast medium is present in the colon.   Electronically Signed   By: Van Clines M.D.   On: 03/05/2015 17:25    Microbiology: Recent Results (from the past 240 hour(s))  Surgical pcr screen     Status: None   Collection Time: 03/07/15  2:20 PM  Result Value Ref Range Status   MRSA, PCR NEGATIVE NEGATIVE Final   Staphylococcus aureus NEGATIVE NEGATIVE Final  Comment:        The Xpert SA Assay (FDA approved for NASAL specimens in patients over 63 years of age), is one component of a comprehensive surveillance program.  Test performance has been validated by [facility] for patients greater than or equal to 36 year old. It is not intended to diagnose infection nor to guide or monitor treatment.      Labs: Basic Metabolic Panel:  Recent Labs Lab 03/10/15 0508 03/13/15 0513 03/15/15 0510 03/16/15 0520  NA 136 134*  --  136  K 4.6 4.7  --  4.1  CL 104 99  --  97  CO2 23 25  --  27  GLUCOSE 178* 104*  --  107*  BUN 15 12  --  21  CREATININE 1.05 1.04 1.04 1.08  CALCIUM 9.0 8.8  --  8.8   Liver Function Tests: No results for input(s): AST, ALT, ALKPHOS, BILITOT, PROT, ALBUMIN in the last 168 hours. No results for input(s): LIPASE, AMYLASE in the last 168 hours. No results for input(s): AMMONIA in the last 168 hours. CBC:  Recent Labs Lab 03/10/15 0508 03/13/15 0513 03/16/15 0520  WBC 8.8 9.3 10.6*  HGB 9.4* 10.1* 9.4*  HCT 30.4* 31.7* 30.8*  MCV 89.9 90.6 90.9  PLT 297 288 321   Cardiac Enzymes: No results for input(s): CKTOTAL, CKMB, CKMBINDEX, TROPONINI in the last 168 hours. BNP: BNP (last 3 results) No results for input(s): BNP in the last 8760 hours.  ProBNP (last 3 results)  Recent Labs  05/29/14 1910 06/06/14 1244 11/29/14 0944  PROBNP 36.3 83.0 114.6    CBG: No results for input(s): GLUCAP in the last 168  hours.     SignedDebbe Odea, MD [facility] 03/16/2015, 12:53 PM

## 2015-03-16 NOTE — Progress Notes (Signed)
Communicated [place] RN referral to [facility] rep, James [name]. Patient also stated that his daughter has been unable to locate the ensure enlive. James [name] and they stated that they are able to order by the case (24 cans for 94.34) and have delivered the next day. Ordered a case of chocolate per the patient request and [facility] will contact patient once receive shipment.

## 2015-03-17 ENCOUNTER — Telehealth: Payer: Self-pay | Admitting: Oncology

## 2015-03-17 NOTE — Telephone Encounter (Signed)
S/w pt confirming MD visit per 03/25 POF..... KJ

## 2015-03-21 ENCOUNTER — Other Ambulatory Visit (HOSPITAL_BASED_OUTPATIENT_CLINIC_OR_DEPARTMENT_OTHER): Payer: Medicare Other

## 2015-03-21 ENCOUNTER — Other Ambulatory Visit: Payer: Self-pay | Admitting: *Deleted

## 2015-03-21 ENCOUNTER — Telehealth: Payer: Self-pay | Admitting: Oncology

## 2015-03-21 ENCOUNTER — Ambulatory Visit (HOSPITAL_BASED_OUTPATIENT_CLINIC_OR_DEPARTMENT_OTHER): Payer: Medicare Other | Admitting: Oncology

## 2015-03-21 VITALS — BP 118/56 | HR 86 | Temp 97.0°F | Resp 18 | Ht 71.5 in | Wt 221.9 lb

## 2015-03-21 DIAGNOSIS — C787 Secondary malignant neoplasm of liver and intrahepatic bile duct: Secondary | ICD-10-CM

## 2015-03-21 DIAGNOSIS — D509 Iron deficiency anemia, unspecified: Secondary | ICD-10-CM

## 2015-03-21 DIAGNOSIS — R109 Unspecified abdominal pain: Secondary | ICD-10-CM

## 2015-03-21 DIAGNOSIS — C801 Malignant (primary) neoplasm, unspecified: Secondary | ICD-10-CM

## 2015-03-21 DIAGNOSIS — I81 Portal vein thrombosis: Secondary | ICD-10-CM

## 2015-03-21 DIAGNOSIS — R591 Generalized enlarged lymph nodes: Secondary | ICD-10-CM | POA: Diagnosis not present

## 2015-03-21 DIAGNOSIS — C170 Malignant neoplasm of duodenum: Secondary | ICD-10-CM

## 2015-03-21 DIAGNOSIS — K639 Disease of intestine, unspecified: Secondary | ICD-10-CM

## 2015-03-21 DIAGNOSIS — G629 Polyneuropathy, unspecified: Secondary | ICD-10-CM

## 2015-03-21 DIAGNOSIS — I82402 Acute embolism and thrombosis of unspecified deep veins of left lower extremity: Secondary | ICD-10-CM

## 2015-03-21 LAB — CBC WITH DIFFERENTIAL/PLATELET
BASO%: 0.4 % (ref 0.0–2.0)
BASOS ABS: 0.1 10*3/uL (ref 0.0–0.1)
EOS%: 1.1 % (ref 0.0–7.0)
Eosinophils Absolute: 0.1 10*3/uL (ref 0.0–0.5)
HCT: 28.8 % — ABNORMAL LOW (ref 38.4–49.9)
HGB: 9.1 g/dL — ABNORMAL LOW (ref 13.0–17.1)
LYMPH%: 10.3 % — AB (ref 14.0–49.0)
MCH: 29.2 pg (ref 27.2–33.4)
MCHC: 31.6 g/dL — AB (ref 32.0–36.0)
MCV: 92.3 fL (ref 79.3–98.0)
MONO#: 1.2 10*3/uL — ABNORMAL HIGH (ref 0.1–0.9)
MONO%: 9.2 % (ref 0.0–14.0)
NEUT#: 10.3 10*3/uL — ABNORMAL HIGH (ref 1.5–6.5)
NEUT%: 79 % — AB (ref 39.0–75.0)
PLATELETS: 331 10*3/uL (ref 140–400)
RBC: 3.12 10*6/uL — AB (ref 4.20–5.82)
RDW: 24.2 % — ABNORMAL HIGH (ref 11.0–14.6)
WBC: 13 10*3/uL — ABNORMAL HIGH (ref 4.0–10.3)
lymph#: 1.3 10*3/uL (ref 0.9–3.3)

## 2015-03-21 MED ORDER — PROCHLORPERAZINE MALEATE 5 MG PO TABS: 5.0000 mg | ORAL_TABLET | Freq: Four times a day (QID) | ORAL | Status: AC | PRN

## 2015-03-21 MED ORDER — OXYCODONE-ACETAMINOPHEN 5-325 MG PO TABS: 1.0000 | ORAL_TABLET | ORAL | Status: AC | PRN

## 2015-03-21 NOTE — [facility] Oncology Navigator Note (Signed)
Met with patient and daughter during hospital follow up visit. Explained the role of the GI Nurse Navigator and provided contact information.  Merceda Elks, RN, BSN GI Oncology [place]

## 2015-03-21 NOTE — Telephone Encounter (Signed)
Gave avs & calendar for April. °

## 2015-03-21 NOTE — Progress Notes (Signed)
  James OFFICE PROGRESS NOTE   Diagnosis: Metastatic carcinoma  INTERVAL HISTORY:   James [name] was discharged on 03/16/2015 after a lengthy hospital admission related to an obstructing duodenal mass. He underwent a palliative gastrojejunostomy procedure while in the hospital. He reports tolerating liquids without difficulty. He takes 2-3 nutrition supplements per day. He reports 1 episode of vomiting since discharge from the hospital. His bowels are moving. James [name] is ambulatory in the home and performing self care. He has "soreness "in the right abdomen, but he is not taking pain medication.  Objective:  Vital signs in last 24 hours:  Blood pressure 118/56, pulse 86, temperature 97 F (36.1 C), temperature source Oral, resp. rate 18, height 5' 11.5" (1.816 m), weight 221 lb 14.4 oz (100.653 kg), SpO2 100 %.   Resp: Lungs clear bilaterally Cardio: Regular rate and rhythm GI: Mild fullness in the right upper abdomen without a discrete liver edge, healed surgical incisions Vascular: No leg edema   Lab Results:  Lab Results  Component Value Date   WBC 13.0* 03/21/2015   HGB 9.1* 03/21/2015   HCT 28.8* 03/21/2015   MCV 92.3 03/21/2015   PLT 331 03/21/2015   NEUTROABS 10.3* 03/21/2015     Lab Results  Component Value Date   CEA 1.4 03/01/2015    Medications: I have reviewed the patient's current medications.  Assessment/Plan: 1.Duodenal mass, abdominal/retroperitoneal lymphadenopathy, and liver metastases noted on a CT 03/01/2015  Upper endoscopy 03/03/2015 confirmed a duodenal mass, status post a biopsy with the final pathology confirming a carcinoma, immunohistochemical stains negative for lymphoma, neuroendocrine carcinoma, and melanoma markers. Positive staining for CDX2, cytokeratin AE1/AE3, and CD10  2. Gastric outlet obstruction secondary to #1, status post a palliative gastrojejunostomy 03/09/2015  3. 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 Portal vein thrombosis diagnosed in September 2015  5. Left leg DVT December 2014  6. History of coronary artery disease, status post an LAD drug-eluting stent July 2015  7. Remote history of melanoma of the right ear  8. 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 History of drug related ITP  10. Abdominal pain secondary to the duodenal mass/liver metastases/adenopathy  11. Neuropathy    Disposition:  James [name] has been diagnosed with metastatic carcinoma involving the liver. There is an obstructing duodenal mass. He most likely has a gastrointestinal primary and the small bowel mass may be the primary tumor site.  No therapy will be curative. We discussed supportive care, single agent capecitabine, and FOLFOX chemotherapy. James [name] would like to consider a multiagent chemotherapy regimen that may have the highest chance of yielding a clinical response.  He will continue to work on his diet and mobility. He is scheduled for a PET scan 03/29/2015. He will return for an office visit here on 03/31/2015 to make a treatment plan.  I gave him prescriptions for Percocet and Compazine today.  Betsy Coder, MD  03/21/2015  5:00 PM

## 2015-03-22 ENCOUNTER — Ambulatory Visit (INDEPENDENT_AMBULATORY_CARE_PROVIDER_SITE_OTHER): Payer: Medicare Other | Admitting: Family Medicine

## 2015-03-22 ENCOUNTER — Telehealth: Payer: Self-pay | Admitting: *Deleted

## 2015-03-22 ENCOUNTER — Encounter: Payer: Self-pay | Admitting: Family Medicine

## 2015-03-22 ENCOUNTER — Telehealth: Payer: Self-pay | Admitting: Nurse Practitioner

## 2015-03-22 VITALS — BP 118/62 | HR 76 | Temp 97.8°F | Wt 223.5 lb

## 2015-03-22 DIAGNOSIS — I2510 Atherosclerotic heart disease of native coronary artery without angina pectoris: Secondary | ICD-10-CM

## 2015-03-22 DIAGNOSIS — E790 Hyperuricemia without signs of inflammatory arthritis and tophaceous disease: Secondary | ICD-10-CM | POA: Diagnosis not present

## 2015-03-22 DIAGNOSIS — R319 Hematuria, unspecified: Secondary | ICD-10-CM

## 2015-03-22 DIAGNOSIS — R0602 Shortness of breath: Secondary | ICD-10-CM | POA: Diagnosis not present

## 2015-03-22 DIAGNOSIS — Z87440 Personal history of urinary (tract) infections: Secondary | ICD-10-CM | POA: Diagnosis not present

## 2015-03-22 DIAGNOSIS — C170 Malignant neoplasm of duodenum: Secondary | ICD-10-CM

## 2015-03-22 LAB — BASIC METABOLIC PANEL
BUN: 28 mg/dL — ABNORMAL HIGH (ref 6–23)
CHLORIDE: 98 meq/L (ref 96–112)
CO2: 30 mEq/L (ref 19–32)
Calcium: 9.2 mg/dL (ref 8.4–10.5)
Creatinine, Ser: 1.19 mg/dL (ref 0.40–1.50)
GFR: 61.8 mL/min (ref >60.00)
GLUCOSE: 171 mg/dL — AB (ref 70–99)
Potassium: 4.2 mEq/L (ref 3.5–5.1)
Sodium: 134 mEq/L — ABNORMAL LOW (ref 135–145)

## 2015-03-22 LAB — URIC ACID: URIC ACID, SERUM: 7.6 mg/dL (ref 4.0–7.8)

## 2015-03-22 LAB — BRAIN NATRIURETIC PEPTIDE: Pro B Natriuretic peptide (BNP): 71 pg/mL (ref 0.0–100.0)

## 2015-03-22 NOTE — Telephone Encounter (Signed)
Per Dr. Benay Spice; notified pt's daughter Jeannene Patella that hemoglobin is stable.  [name] verbalized understanding of information.  Also request 03/31/15 apt be changed d/t conflict with another MD appt that day at 9:30. POF sent to schedulers per request.

## 2015-03-22 NOTE — Progress Notes (Signed)
Pre visit review using our clinic review tool, if applicable. No additional management support is needed unless otherwise documented below in the visit note.  Admitted with anemia, GIB, found to have CA in duodenum, mets to liver.  S/p gastric surgery to relieve obstruction.  He is supplementing diet with ensure tid.  He still has trouble getting some solids down, not a new issue for patient.    Had f/u with onc, PET pending, with plan to make decision re: chemo after that.  Patient is aware the chemo wouldn't be curative.    He is sore but not in pain, from the surgery.  Still with some SOB, at baseline.  Less edema recently, sig weight loss noted with the illness.  Recently with black stools, more normal today and that is a noted change.  No dysuria.  Does have recent inc in WBC noted, but no fevers.    Living at home with [facility] currently.  He was asking about his current meds, ie allopurinol.  Due for f/u labs.  Still on lasix.  We talked about his fluid status, with concern for CHF.    PMH and SH reviewed  ROS: See HPI, otherwise noncontributory.  Meds, vitals, and allergies reviewed.   nad ncat Mmm Neck supple, no LA rrr ctab abd soft, not ttp, port sites healing well, no rebound, normal BS Ext w/o edema No jaundice

## 2015-03-22 NOTE — Telephone Encounter (Signed)
-----   Message from Ladell Pier, MD sent at 03/21/2015  5:10 PM EDT ----- Please call patient, hb is stable

## 2015-03-22 NOTE — Patient Instructions (Signed)
I would keep taking the allopurinol for now.   Go to the lab on the way out.  We'll contact you with your lab report. We'll see which way to go on your lasix when I see you labs.  If you have a fever or chills or burning with urination, then notify me.   Take care.  Glad to see you.

## 2015-03-22 NOTE — Telephone Encounter (Signed)
Left message to confrim appointment for April 8.

## 2015-03-23 ENCOUNTER — Encounter: Payer: Self-pay | Admitting: Family Medicine

## 2015-03-23 LAB — URINE CULTURE

## 2015-03-23 NOTE — Assessment & Plan Note (Signed)
We did culture his urine, given the WBC from yesterday.  He has no sx.  He has no other focal signs/sx at this point.  He isn't obstructed and doesn't have an acute abdomen.  Continue current meds for now, see notes on labs.   We'll likely have to adjust his lasix based on his Cr and fluid status.  He is trying to supplement his diet as much as possible.  We talked about his prognosis and he is realistic.  I'll await the PET and onc plans.   >25 minutes spent in face to face time with patient, >50% spent in counselling or coordination of care.

## 2015-03-27 ENCOUNTER — Telehealth: Payer: Self-pay | Admitting: Nutrition

## 2015-03-27 NOTE — Telephone Encounter (Signed)
Contacted patient by phone.  Left message for patient to call me with his needs.

## 2015-03-29 ENCOUNTER — Ambulatory Visit (HOSPITAL_COMMUNITY)
Admission: RE | Admit: 2015-03-29 | Discharge: 2015-03-29 | Disposition: A | Discharge status: Home / Self Care | Payer: Medicare Other | Source: Ambulatory Visit | Attending: Oncology | Admitting: Oncology

## 2015-03-29 ENCOUNTER — Ambulatory Visit: Payer: TRICARE For Life (TFL) | Admitting: Oncology

## 2015-03-29 DIAGNOSIS — R160 Hepatomegaly, not elsewhere classified: Secondary | ICD-10-CM | POA: Diagnosis not present

## 2015-03-29 DIAGNOSIS — C170 Malignant neoplasm of duodenum: Secondary | ICD-10-CM | POA: Diagnosis present

## 2015-03-29 DIAGNOSIS — Z8551 Personal history of malignant neoplasm of bladder: Secondary | ICD-10-CM | POA: Diagnosis not present

## 2015-03-29 DIAGNOSIS — C772 Secondary and unspecified malignant neoplasm of intra-abdominal lymph nodes: Secondary | ICD-10-CM | POA: Insufficient documentation

## 2015-03-29 LAB — GLUCOSE, CAPILLARY: GLUCOSE-CAPILLARY: 132 mg/dL — AB (ref 70–99)

## 2015-03-29 MED ORDER — FLUDEOXYGLUCOSE F - 18 (FDG) INJECTION
11.9000 | Freq: Once | INTRAVENOUS | Status: AC | PRN
  Administered 2015-03-29: 11.9 via INTRAVENOUS

## 2015-03-31 ENCOUNTER — Telehealth: Payer: Self-pay | Admitting: Oncology

## 2015-03-31 ENCOUNTER — Ambulatory Visit (HOSPITAL_BASED_OUTPATIENT_CLINIC_OR_DEPARTMENT_OTHER): Payer: Medicare Other | Admitting: Nurse Practitioner

## 2015-03-31 ENCOUNTER — Telehealth: Payer: Self-pay | Admitting: *Deleted

## 2015-03-31 ENCOUNTER — Ambulatory Visit: Payer: TRICARE For Life (TFL) | Admitting: Nurse Practitioner

## 2015-03-31 VITALS — BP 119/54 | HR 73 | Resp 18 | Ht 71.0 in

## 2015-03-31 DIAGNOSIS — Z86711 Personal history of pulmonary embolism: Secondary | ICD-10-CM | POA: Diagnosis not present

## 2015-03-31 DIAGNOSIS — C679 Malignant neoplasm of bladder, unspecified: Secondary | ICD-10-CM

## 2015-03-31 DIAGNOSIS — C170 Malignant neoplasm of duodenum: Secondary | ICD-10-CM

## 2015-03-31 DIAGNOSIS — I81 Portal vein thrombosis: Secondary | ICD-10-CM

## 2015-03-31 DIAGNOSIS — G622 Polyneuropathy due to other toxic agents: Secondary | ICD-10-CM | POA: Diagnosis not present

## 2015-03-31 DIAGNOSIS — G893 Neoplasm related pain (acute) (chronic): Secondary | ICD-10-CM

## 2015-03-31 DIAGNOSIS — D500 Iron deficiency anemia secondary to blood loss (chronic): Secondary | ICD-10-CM

## 2015-03-31 DIAGNOSIS — C787 Secondary malignant neoplasm of liver and intrahepatic bile duct: Secondary | ICD-10-CM | POA: Diagnosis not present

## 2015-03-31 DIAGNOSIS — Z85820 Personal history of malignant melanoma of skin: Secondary | ICD-10-CM

## 2015-03-31 MED ORDER — PREDNISONE 5 MG PO TABS: 5.0000 mg | ORAL_TABLET | Freq: Every day | ORAL | Status: DC

## 2015-03-31 NOTE — Telephone Encounter (Signed)
gave and printed appt sched and avs fo rpt for April adn May.....sed added tx. °

## 2015-03-31 NOTE — Progress Notes (Addendum)
[facility] OFFICE PROGRESS NOTE   Diagnosis:  Metastatic carcinoma  INTERVAL HISTORY:   Mr. Batterman returns as scheduled. He continues to have a poor appetite. He did not tolerate Marinol well and discontinued it. He would like something for his appetite. Energy level is poor though he feels he has had some improvement. He has occasional nausea/vomiting. Bowels have been somewhat erratic. He reports baseline peripheral neuropathy affecting the fingertips and feet.  Objective:  Vital signs in last 24 hours:  Blood pressure 119/54, pulse 73, temperature 0 F (-17.8 C), resp. rate 18, height 5\' 11"  (1.803 m), SpO2 97 %.    HEENT: No thrush or ulcers. Resp: Lungs clear bilaterally. Cardio: Regular rate and rhythm. GI: Abdomen is soft. Tender at the right upper abdomen. No hepatomegaly. No mass. Vascular: Trace edema left lower leg.   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 Remote history of bladder carcinoma.  EXAM: NUCLEAR MEDICINE PET SKULL BASE TO THIGH  TECHNIQUE: 11.9 mCi F-18 FDG was injected intravenously. Full-Golberg PET imaging was performed from the skull base to thigh after the radiotracer. CT data was obtained and used for attenuation correction and anatomic localization.  FASTING BLOOD GLUCOSE:  Value: 132 mg/dl  COMPARISON:  CT 09/24/2014, 29,000  FINDINGS: NECK  No hypermetabolic lymph nodes in the neck.  CHEST  No hypermetabolic mediastinal or hilar nodes. No suspicious pulmonary nodules on the CT scan.  ABDOMEN/PELVIS  Large intensely hypermetabolic mass circumferentially narrowing the second portion duodenum. Mass measures 5.8 cm in diameter (image 122) with  SUV max equal 11. There are adjacent hypermetabolic lymph nodes medial to the duodenum mass. For example 19 mm short axis lymph node on image 126 with SUV max 10.2. There is a a irregular hypermetabolic nodal mass in the inferior aspect of the gastro hepatic ligament measuring 37 mm with SUV max 7.5.  Hypermetabolic nodal metastasis in the retroperitoneum left of the aorta measures 14 mm with SUV equal 12.6  There is no abnormal metabolic activity within the enlarged inferior right lobe of the liver.  Patient status post gastrojejunostomy without complication.  SKELETON  There is diffuse mild increase in marrow activity which may relate to anemia. No abnormal uptake within the left iliac psoas muscle where there is a small lucency on image 190.  IMPRESSION: 1. Intense hypermetabolic activity associated circumferential mural thickening of the second portion duodenum consistent with duodenal adenocarcinoma. 2. Patient status post gastrojejunostomy without complication 3. Local nodal metastasis medial and superior to the duodenum. 4. Retroperitoneal nodal metastasis left of the aorta. 5. No abnormal metabolic activity within the enlarged right lobe of liver. Enlargement likely relates to thrombosis of the right portal vein rather than liver metastasis. 6. No evidence of more distant metastatic disease 7. Diffuse increase in marrow activity likely relates to anemia   Electronically Signed   By: Suzy Bouchard M.D.   On: 03/29/2015 17:32    Medications: I have reviewed the patient's current medications.  Assessment/Plan: 1.Duodenal mass, abdominal/retroperitoneal lymphadenopathy, and liver metastases noted on a CT 03/01/2015  Upper endoscopy 03/03/2015 confirmed a duodenal mass, status post a biopsy with the  final pathology confirming a carcinoma, immunohistochemical stains negative for lymphoma, neuroendocrine carcinoma, and melanoma markers. Positive staining for CDX2, cytokeratin AE1/AE3, and CD10  PET  scan 03/29/2015 showed a large intensely hypermetabolic mass circumferentially narrowing the second portion of the duodenum. Adjacent hypermetabolic lymph nodes medial to the duodenum mass. Irregular hypermetabolic nodal mass in the inferior aspect of the gastrohepatic ligament. Hypermetabolic nodal metastasis in the retroperitoneum left of the aorta. No abnormal metabolic activity within the enlarged inferior right lobe of the liver.  2. Gastric outlet obstruction secondary to #1, status post a palliative gastrojejunostomy 03/09/2015  3. 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 Portal vein thrombosis diagnosed in September 2015  5. Left leg DVT December 2014  6. History of coronary artery disease, status post an LAD drug-eluting stent July 2015  7. Remote history of melanoma of the right ear  8. 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 History of drug related ITP  10. Abdominal pain secondary to the duodenal mass/liver metastases/adenopathy  11. Neuropathy   Disposition: Dr. Benay Spice reviewed the PET scan report/images with Mr. Boehning and his daughter. They understand that despite the lack of abnormal metabolic activity involving the liver, Dr. Benay Spice remains suspicious that there is metastatic disease involving the liver based on previous scans.  We discussed treatment options. They understand that no therapy will be curative. Dr. Benay Spice discussed a supportive care approach, single agent Xeloda, FOLFOX or FOLFIRI. Mr. Farabee would like to proceed with FOLFOX. We reviewed potential toxicities associated with chemotherapy including nausea/vomiting, myelosuppression, hair loss, allergic reaction. We discussed potential toxicities associated with 5-fluorouracil including mouth sores, diarrhea, hand-foot syndrome, rash, skin hyperpigmentation, increased sensitivity to sunlight, conjunctivitis. We reviewed  potential toxicities associated with oxaliplatin including cold sensitivity, peripheral neuropathy, acute laryngo- pharyngeal dysesthesia, diplopia, ataxia. He understands the pre-existing peripheral neuropathy may worsen and could potentially be permanent. He is willing to accept this risk. He will attend a chemotherapy education class.  We made a referral to Dr. Excell Seltzer for placement of a Port-A-Cath.  For appetite stimulation he will begin prednisone 5 mg daily.  He will return to proceed with cycle 1 FOLFOX on 04/10/2015. We will see him in follow-up prior to cycle 2 on 04/24/2015. He will contact the office in the interim with any problems.  Patient seen with Dr. Benay Spice. 45 minutes were spent face-to-face at today's visit with the majority of that time involved in counseling/coordination of care.  Ned Card ANP/GNP-BC   03/31/2015  3:21 PM  This was a shared visit with Ned Card. Mr. Hausman was interviewed and examined. His performance status appears slightly improved compared to when he was here on 03/21/2015. We reviewed the PET images with Mr. Biven and his daughter. He appears to have metastatic disease involving abdominal/retroperitoneal lymph nodes. Despite the lack of PET uptake I suspect he has liver metastases. His case was presented at the GI tumor conference earlier this week and the liver lesions have progressed consistent with metastases.  We reviewed treatment options in detail with Mr. Thoman. He would like to proceed with systemic therapy. We discussed single agent capecitabine, FOLFOX, and FOLFIRI. Mr. General is most comfortable proceeding with FOLFOX.  We will refer him for placement of a Port-A-Cath with the plan to begin a first cycle of FOLFOX on 04/10/2015.  Julieanne Manson, M.D.

## 2015-03-31 NOTE — Telephone Encounter (Signed)
Called and informed [facility] that patient would like to stop home care.  Per Elby Showers. Marcello Moores, NP.

## 2015-03-31 NOTE — Telephone Encounter (Signed)
Per staff message and POF I have scheduled appts. Advised scheduler of appts. JMW  

## 2015-04-02 ENCOUNTER — Other Ambulatory Visit: Payer: Self-pay | Admitting: Family Medicine

## 2015-04-03 ENCOUNTER — Encounter (HOSPITAL_BASED_OUTPATIENT_CLINIC_OR_DEPARTMENT_OTHER): Payer: Self-pay | Admitting: *Deleted

## 2015-04-03 NOTE — Progress Notes (Signed)
Labs and ekg done-recent surgery-daughter to bring him-

## 2015-04-04 ENCOUNTER — Other Ambulatory Visit: Payer: Medicare Other

## 2015-04-04 ENCOUNTER — Encounter: Payer: Self-pay | Admitting: *Deleted

## 2015-04-05 ENCOUNTER — Ambulatory Visit (HOSPITAL_COMMUNITY): Payer: Medicare Other

## 2015-04-05 ENCOUNTER — Ambulatory Visit (HOSPITAL_BASED_OUTPATIENT_CLINIC_OR_DEPARTMENT_OTHER)
Admission: RE | Admit: 2015-04-05 | Discharge: 2015-04-05 | Disposition: A | Discharge status: Home / Self Care | Payer: Medicare Other | Source: Ambulatory Visit | Attending: General Surgery | Admitting: General Surgery

## 2015-04-05 ENCOUNTER — Encounter (HOSPITAL_BASED_OUTPATIENT_CLINIC_OR_DEPARTMENT_OTHER)
Admission: RE | Disposition: A | Discharge status: Home / Self Care | Payer: Self-pay | Source: Ambulatory Visit | Attending: General Surgery

## 2015-04-05 ENCOUNTER — Ambulatory Visit (HOSPITAL_BASED_OUTPATIENT_CLINIC_OR_DEPARTMENT_OTHER): Payer: Medicare Other | Admitting: Anesthesiology

## 2015-04-05 ENCOUNTER — Encounter (HOSPITAL_BASED_OUTPATIENT_CLINIC_OR_DEPARTMENT_OTHER): Payer: Self-pay

## 2015-04-05 DIAGNOSIS — I10 Essential (primary) hypertension: Secondary | ICD-10-CM | POA: Insufficient documentation

## 2015-04-05 DIAGNOSIS — K219 Gastro-esophageal reflux disease without esophagitis: Secondary | ICD-10-CM | POA: Insufficient documentation

## 2015-04-05 DIAGNOSIS — I2510 Atherosclerotic heart disease of native coronary artery without angina pectoris: Secondary | ICD-10-CM | POA: Diagnosis not present

## 2015-04-05 DIAGNOSIS — Z86718 Personal history of other venous thrombosis and embolism: Secondary | ICD-10-CM | POA: Diagnosis not present

## 2015-04-05 DIAGNOSIS — R591 Generalized enlarged lymph nodes: Secondary | ICD-10-CM | POA: Insufficient documentation

## 2015-04-05 DIAGNOSIS — C170 Malignant neoplasm of duodenum: Secondary | ICD-10-CM | POA: Diagnosis not present

## 2015-04-05 DIAGNOSIS — M109 Gout, unspecified: Secondary | ICD-10-CM | POA: Insufficient documentation

## 2015-04-05 DIAGNOSIS — I739 Peripheral vascular disease, unspecified: Secondary | ICD-10-CM | POA: Insufficient documentation

## 2015-04-05 DIAGNOSIS — Z7982 Long term (current) use of aspirin: Secondary | ICD-10-CM | POA: Insufficient documentation

## 2015-04-05 DIAGNOSIS — Z85820 Personal history of malignant melanoma of skin: Secondary | ICD-10-CM | POA: Diagnosis not present

## 2015-04-05 DIAGNOSIS — N400 Enlarged prostate without lower urinary tract symptoms: Secondary | ICD-10-CM | POA: Diagnosis not present

## 2015-04-05 DIAGNOSIS — Z95828 Presence of other vascular implants and grafts: Secondary | ICD-10-CM

## 2015-04-05 DIAGNOSIS — A809 Acute poliomyelitis, unspecified: Secondary | ICD-10-CM | POA: Diagnosis not present

## 2015-04-05 DIAGNOSIS — Z955 Presence of coronary angioplasty implant and graft: Secondary | ICD-10-CM | POA: Diagnosis not present

## 2015-04-05 DIAGNOSIS — E780 Pure hypercholesterolemia: Secondary | ICD-10-CM | POA: Diagnosis not present

## 2015-04-05 DIAGNOSIS — E039 Hypothyroidism, unspecified: Secondary | ICD-10-CM | POA: Insufficient documentation

## 2015-04-05 DIAGNOSIS — I509 Heart failure, unspecified: Secondary | ICD-10-CM | POA: Insufficient documentation

## 2015-04-05 DIAGNOSIS — D509 Iron deficiency anemia, unspecified: Secondary | ICD-10-CM | POA: Diagnosis not present

## 2015-04-05 DIAGNOSIS — M1990 Unspecified osteoarthritis, unspecified site: Secondary | ICD-10-CM | POA: Insufficient documentation

## 2015-04-05 DIAGNOSIS — Z87891 Personal history of nicotine dependence: Secondary | ICD-10-CM | POA: Diagnosis not present

## 2015-04-05 DIAGNOSIS — M170 Bilateral primary osteoarthritis of knee: Secondary | ICD-10-CM | POA: Insufficient documentation

## 2015-04-05 DIAGNOSIS — F419 Anxiety disorder, unspecified: Secondary | ICD-10-CM | POA: Diagnosis not present

## 2015-04-05 DIAGNOSIS — J449 Chronic obstructive pulmonary disease, unspecified: Secondary | ICD-10-CM | POA: Insufficient documentation

## 2015-04-05 HISTORY — PX: PORTACATH PLACEMENT: SHX2246

## 2015-04-05 SURGERY — INSERTION, TUNNELED CENTRAL VENOUS DEVICE, WITH PORT
Anesthesia: General | Site: Chest | Laterality: Right

## 2015-04-05 MED ORDER — BUPIVACAINE-EPINEPHRINE (PF) 0.25% -1:200000 IJ SOLN
INTRAMUSCULAR | Status: AC
  Filled 2015-04-05: qty 30

## 2015-04-05 MED ORDER — FENTANYL CITRATE 0.05 MG/ML IJ SOLN
INTRAMUSCULAR | Status: AC
  Filled 2015-04-05: qty 4

## 2015-04-05 MED ORDER — HEPARIN SOD (PORK) LOCK FLUSH 100 UNIT/ML IV SOLN
INTRAVENOUS | Status: DC | PRN
  Administered 2015-04-05: 500 [IU] via INTRAVENOUS

## 2015-04-05 MED ORDER — FENTANYL CITRATE 0.05 MG/ML IJ SOLN
50.0000 ug | INTRAMUSCULAR | Status: DC | PRN
  Administered 2015-04-05: 50 ug via INTRAVENOUS

## 2015-04-05 MED ORDER — LIDOCAINE HCL (PF) 1 % IJ SOLN
INTRAMUSCULAR | Status: AC
  Filled 2015-04-05: qty 30

## 2015-04-05 MED ORDER — CIPROFLOXACIN IN D5W 400 MG/200ML IV SOLN
INTRAVENOUS | Status: AC
  Filled 2015-04-05: qty 200

## 2015-04-05 MED ORDER — LACTATED RINGERS IV SOLN
INTRAVENOUS | Status: DC
  Administered 2015-04-05 (×2): via INTRAVENOUS

## 2015-04-05 MED ORDER — BUPIVACAINE-EPINEPHRINE 0.25% -1:200000 IJ SOLN
INTRAMUSCULAR | Status: DC | PRN
  Administered 2015-04-05: 14 mL

## 2015-04-05 MED ORDER — MIDAZOLAM HCL 2 MG/2ML IJ SOLN: 1.0000 mg | INTRAMUSCULAR | Status: DC | PRN

## 2015-04-05 MED ORDER — HEPARIN SOD (PORK) LOCK FLUSH 100 UNIT/ML IV SOLN
INTRAVENOUS | Status: AC
  Filled 2015-04-05: qty 5

## 2015-04-05 MED ORDER — HEPARIN (PORCINE) IN NACL 2-0.9 UNIT/ML-% IJ SOLN
INTRAMUSCULAR | Status: AC
Start: 2015-04-05 — End: 2015-04-05
  Filled 2015-04-05: qty 500

## 2015-04-05 MED ORDER — LIDOCAINE HCL (CARDIAC) 20 MG/ML IV SOLN
INTRAVENOUS | Status: DC | PRN
  Administered 2015-04-05: 40 mg via INTRAVENOUS

## 2015-04-05 MED ORDER — SODIUM BICARBONATE 4 % IV SOLN
INTRAVENOUS | Status: AC
  Filled 2015-04-05: qty 5

## 2015-04-05 MED ORDER — DEXAMETHASONE SODIUM PHOSPHATE 4 MG/ML IJ SOLN
INTRAMUSCULAR | Status: DC | PRN
  Administered 2015-04-05: 10 mg via INTRAVENOUS

## 2015-04-05 MED ORDER — HEPARIN (PORCINE) IN NACL 2-0.9 UNIT/ML-% IJ SOLN
INTRAMUSCULAR | Status: DC | PRN
  Administered 2015-04-05: 1 via INTRAVENOUS

## 2015-04-05 MED ORDER — ONDANSETRON HCL 4 MG/2ML IJ SOLN
INTRAMUSCULAR | Status: DC | PRN
  Administered 2015-04-05: 4 mg via INTRAVENOUS

## 2015-04-05 MED ORDER — PROPOFOL 10 MG/ML IV BOLUS
INTRAVENOUS | Status: DC | PRN
  Administered 2015-04-05: 120 mg via INTRAVENOUS

## 2015-04-05 MED ORDER — CIPROFLOXACIN IN D5W 400 MG/200ML IV SOLN
INTRAVENOUS | Status: DC | PRN
  Administered 2015-04-05: 400 mg via INTRAVENOUS

## 2015-04-05 SURGICAL SUPPLY — 42 items
BAG DECANTER FOR FLEXI CONT (MISCELLANEOUS) ×2 IMPLANT
BANDAGE ADH SHEER 1  50/CT (GAUZE/BANDAGES/DRESSINGS) ×2 IMPLANT
BLADE SURG 15 STRL LF DISP TIS (BLADE) ×1 IMPLANT
BLADE SURG 15 STRL SS (BLADE) ×1
CHLORAPREP W/TINT 26ML (MISCELLANEOUS) ×2 IMPLANT
COVER BACK TABLE 60X90IN (DRAPES) ×2 IMPLANT
COVER MAYO STAND STRL (DRAPES) ×2 IMPLANT
DECANTER SPIKE VIAL GLASS SM (MISCELLANEOUS) IMPLANT
DRAPE C-ARM 42X72 X-RAY (DRAPES) ×2 IMPLANT
DRAPE LAPAROTOMY T 102X78X121 (DRAPES) ×2 IMPLANT
DRAPE UTILITY XL STRL (DRAPES) ×2 IMPLANT
ELECT REM PT RETURN 9FT ADLT (ELECTROSURGICAL) ×2
ELECTRODE REM PT RTRN 9FT ADLT (ELECTROSURGICAL) ×1 IMPLANT
GLOVE BIOGEL M STRL SZ7.5 (GLOVE) ×2 IMPLANT
GLOVE BIOGEL PI IND STRL 6.5 (GLOVE) ×1 IMPLANT
GLOVE BIOGEL PI IND STRL 8 (GLOVE) ×2 IMPLANT
GLOVE BIOGEL PI INDICATOR 6.5 (GLOVE) ×1
GLOVE BIOGEL PI INDICATOR 8 (GLOVE) ×2
GLOVE ECLIPSE 6.5 STRL STRAW (GLOVE) ×2 IMPLANT
GLOVE ECLIPSE 7.5 STRL STRAW (GLOVE) ×2 IMPLANT
GOWN STRL REUS W/ TWL LRG LVL3 (GOWN DISPOSABLE) ×1 IMPLANT
GOWN STRL REUS W/ TWL XL LVL3 (GOWN DISPOSABLE) ×2 IMPLANT
GOWN STRL REUS W/TWL LRG LVL3 (GOWN DISPOSABLE) ×1
GOWN STRL REUS W/TWL XL LVL3 (GOWN DISPOSABLE) ×2
IV CATH PLACEMENT UNIT 16 GA (IV SOLUTION) IMPLANT
IV KIT MINILOC 20X1 SAFETY (NEEDLE) IMPLANT
KIT PORT POWER 8FR ISP CVUE (Catheter) ×2 IMPLANT
LIQUID BAND (GAUZE/BANDAGES/DRESSINGS) ×2 IMPLANT
NEEDLE HYPO 22GX1.5 SAFETY (NEEDLE) ×2 IMPLANT
NEEDLE HYPO 25X1 1.5 SAFETY (NEEDLE) ×2 IMPLANT
PACK BASIN DAY SURGERY FS (CUSTOM PROCEDURE TRAY) ×2 IMPLANT
PENCIL BUTTON HOLSTER BLD 10FT (ELECTRODE) ×2 IMPLANT
SET SHEATH INTRODUCER 10FR (MISCELLANEOUS) IMPLANT
SHEATH COOK PEEL AWAY SET 9F (SHEATH) IMPLANT
SLEEVE SCD COMPRESS KNEE MED (MISCELLANEOUS) ×2 IMPLANT
SUT MON AB 4-0 PC3 18 (SUTURE) ×2 IMPLANT
SUT PROLENE 2 0 CT2 30 (SUTURE) ×2 IMPLANT
SUT SILK 4 0 TIES 17X18 (SUTURE) IMPLANT
SYR 5ML LUER SLIP (SYRINGE) ×2 IMPLANT
SYR CONTROL 10ML LL (SYRINGE) ×2 IMPLANT
TOWEL OR 17X24 6PK STRL BLUE (TOWEL DISPOSABLE) ×4 IMPLANT
TOWEL OR NON WOVEN STRL DISP B (DISPOSABLE) ×2 IMPLANT

## 2015-04-05 NOTE — Discharge Instructions (Signed)

## 2015-04-05 NOTE — Anesthesia Preprocedure Evaluation (Signed)
Anesthesia Evaluation  Patient identified by MRN, date of birth, ID band Patient awake    Reviewed: Allergy & Precautions, H&P , NPO status , Patient's Chart, lab work & pertinent test results  Airway Mallampati: II  TM Distance: >3 FB Neck ROM: Full    Dental  (+) Teeth Intact, Dental Advisory Given   Pulmonary shortness of breath, pneumonia -, resolved, COPDformer smoker,  breath sounds clear to auscultation        Cardiovascular hypertension, Pt. on medications + CAD (Drug eluting stents placed ini July 0539 without complications), + Peripheral Vascular Disease and +CHF Rhythm:Regular Rate:Normal     Neuro/Psych PSYCHIATRIC DISORDERS Anxiety    GI/Hepatic Neg liver ROS, hiatal hernia, GERD-  Medicated and Controlled,Liver mets   Endo/Other  negative endocrine ROS  Renal/GU Renal disease     Musculoskeletal  (+) Arthritis -,   Abdominal   Peds  Hematology negative hematology ROS (+) anemia , ITP   Anesthesia Other Findings   Reproductive/Obstetrics negative OB ROS                             Anesthesia Physical  Anesthesia Plan  ASA: III  Anesthesia Plan: General   Post-op Pain Management:    Induction: Intravenous  Airway Management Planned: LMA  Additional Equipment:   Intra-op Plan:   Post-operative Plan: Extubation in OR  Informed Consent: I have reviewed the patients History and Physical, chart, labs and discussed the procedure including the risks, benefits and alternatives for the proposed anesthesia with the patient or authorized representative who has indicated his/her understanding and acceptance.   Dental advisory given  Plan Discussed with: CRNA  Anesthesia Plan Comments:         Anesthesia Quick Evaluation

## 2015-04-05 NOTE — Op Note (Signed)
Preoperative diagnosis: Cancer of the duodenum poor venous access  Postoperative diagnosis: Same  Procedure: Placement of ClearVue subcutaneous venous port  Surgeon: Excell Seltzer M.D.  Anesthesia: LMA general  Description of procedure: Patient is brought to the operating room and placed in the supine position on the operating table. IV sedation was administered. The entire upper chest and neck were widely sterilely prepped and draped. Local anesthesia was used to infiltrate the insertion of port site. The right internal jugular vein was cannulated with a needle and guidewire without difficulty and position in the superior vena cava was confirmed by fluoroscopy. The introducer was then placed over the guidewire and the flushed catheter placed via the introducer which was stripped away and the tip of the catheter positioned near the cavoatrial junction. A small transverse incision was made in the anterior chest wall and subcutaneous pocket created. The catheter was tunneled into the pocket, trimmed to length, and attached to the flushed port which was positioned in the pocket. The port was sutured to the chest wall with interrupted 2-0 Prolene. The incisions were closed with subcutaneous interrupted Monocryl and the skin incisions closed with subcuticular Monocryl and Dermabond. The port was accessed and flushed and aspirated easily and was left flushed with concentrated heparin solution. Sponge needle as the counts were correct. The patient was taken to recovery in good condition.  Ragan Reale T  04/05/2015

## 2015-04-05 NOTE — Anesthesia Procedure Notes (Signed)
Procedure Name: LMA Insertion Date/Time: 04/05/2015 1:04 PM Performed by: Lyndee Leo Pre-anesthesia Checklist: Patient identified, Emergency Drugs available, Suction available and Patient being monitored Patient Re-evaluated:Patient Re-evaluated prior to inductionOxygen Delivery Method: Circle System Utilized Preoxygenation: Pre-oxygenation with 100% oxygen Intubation Type: IV induction Ventilation: Mask ventilation without difficulty LMA: LMA with gastric port inserted LMA Size: 5.0 Number of attempts: 1 Placement Confirmation: positive ETCO2 Tube secured with: Tape Dental Injury: Teeth and Oropharynx as per pre-operative assessment

## 2015-04-05 NOTE — Transfer of Care (Signed)
Immediate Anesthesia Transfer of Care Note  Patient: James [name]  Procedure(s) Performed: Procedure(s): INSERTION PORT-A-CATH (Right)  Patient Location: PACU  Anesthesia Type:General  Level of Consciousness: awake, sedated and patient cooperative  Airway & Oxygen Therapy: Patient Spontanous Breathing and Patient connected to face mask oxygen  Post-op Assessment: Report given to RN and Post -op Vital signs reviewed and stable  Post vital signs: Reviewed and stable  Last Vitals:  Filed Vitals:   04/05/15 1400  BP: 121/44  Pulse: 65  Temp:   Resp: 16    Complications: No apparent anesthesia complications

## 2015-04-05 NOTE — Anesthesia Postprocedure Evaluation (Signed)
  Anesthesia Post-op Note  Patient: James [name]  Procedure(s) Performed: Procedure(s): INSERTION PORT-A-CATH (Right)  Patient Location: PACU  Anesthesia Type:General  Level of Consciousness: awake, sedated and patient cooperative  Airway and Oxygen Therapy: Patient Spontanous Breathing and Patient connected to face mask oxygen  Post-op Pain: none  Post-op Assessment: Patient's Cardiovascular Status Stable  Post-op Vital Signs: Reviewed and stable  Last Vitals:  Filed Vitals:   04/05/15 1400  BP: 121/44  Pulse: 65  Temp:   Resp: 16    Complications: No apparent anesthesia complications

## 2015-04-06 ENCOUNTER — Encounter (HOSPITAL_BASED_OUTPATIENT_CLINIC_OR_DEPARTMENT_OTHER): Payer: Self-pay | Admitting: General Surgery

## 2015-04-06 ENCOUNTER — Other Ambulatory Visit: Payer: Self-pay

## 2015-04-06 MED ORDER — LIDOCAINE-PRILOCAINE 2.5-2.5 % EX CREA: 1.0000 "application " | TOPICAL_CREAM | CUTANEOUS | Status: AC | PRN

## 2015-04-06 NOTE — H&P (Signed)
History of Present Illness James Kitchen T. James [name]; 03/31/2015 10:46 AM) Patient words: post-op.  The patient is a 81 year old male presenting for a post-operative visit. He returns 3 weeks following laparoscopic gastrojejunostomy for an obstructing primary malignancy of the proximal duodenum. He has been home about 2 weeks. He is still having some issues with eating but is definitely improved from presentation to the hospital when he was unable to tolerate any oral intake. He can occasionally eat small soft meals without difficulty but at other times feels that food gets stuck in his throat or has had some episodes of vomiting. He was on an appetite stimulant for about a week and this seemed to provoke more vomiting and since he has been off it for 4 days he is doing better. Overall certainly tolerating oral much better than at presentation. He has had a PET scan and has follow-up at the cancer center today to discuss treatment options with Dr. Benay Spice   Other Problems James [name], [name]; 03/31/2015 10:10 AM) Bladder Problems Cancer Congestive Heart Failure Enlarged Prostate Gastroesophageal Reflux Disease Hypercholesterolemia Melanoma Transfusion history  Past Surgical History James [name], [name]; 03/31/2015 10:10 AM) Appendectomy Cataract Surgery Bilateral. Knee Surgery Left. Open Inguinal Hernia Surgery Bilateral. Oral Surgery Resection of Small Bowel Shoulder Surgery Left.  Diagnostic Studies History James [name], [name]; 03/31/2015 10:10 AM) Colonoscopy within last year  Medication History James [name], [name]; 03/31/2015 10:16 AM) Finasteride (5MG  Tablet, Oral) Active. Oxybutynin Chloride (5MG  Tablet, Oral) Active. Prochlorperazine Maleate (5MG  Tablet, Oral) Active. Vigamox (0.5% Solution, Ophthalmic) Active. Clobetasol Propionate (0.05% Cream, External) Active. Nitrofurantoin Monohyd Macro (100MG  Capsule, Oral) Active. Oxycodone-Acetaminophen (5-325MG  Tablet,  Oral as needed) Active. Marinol (2.5MG  Capsule, Oral) Active. Aspirin EC (81MG  Tablet DR, Oral) Active. Allopurinol (300MG  Tablet, Oral) Active. Vitamin D (400UNIT Tablet, Oral) Active. Spironolactone (25MG  Tablet, Oral) Active. Psyllium-Calcium (Oral) Active. Probiotic & Acidophilus Ex St (Oral) Active. Nitrostat (0.4MG  Tab Sublingual, Sublingual as needed) Active. Livalo (4MG  Tablet, Oral) Active. Pantoprazole Sodium (40MG  Tablet DR, Oral) Active. Compazine (5MG  Tablet, Oral) Active. Ensure (Oral) Active. Medications Reconciled  Social History James [name], [name]; 03/31/2015 10:10 AM) Alcohol use Occasional alcohol use. No caffeine use No drug use Tobacco use Former smoker.  Family History James [name], [name]; 03/31/2015 10:10 AM) Alcohol Abuse Brother. Bleeding disorder Sister. Cancer Mother. Cerebrovascular Accident Sister. Colon Cancer Brother. Colon Polyps Brother, Daughter. Heart Disease Father. Hypertension Father, Sister. Migraine Headache Sister. Respiratory Condition Sister. Thyroid problems Mother.  Review of Systems ([place]; 03/31/2015 10:10 AM) General Present- Appetite Loss.   Vitals (James [name]; 03/31/2015 10:12 AM) 03/31/2015 10:11 AM Weight: 218 lb Height: 71in Body Surface Area: 2.23 m Body Mass Index: 30.4 kg/m Temp.: 97.63F(Temporal)  Pulse: 79 (Regular)  BP: 130/78 (Sitting, Left Arm, Standard)    Physical Exam James Kitchen T. Satara Virella [name]; 03/31/2015 10:47 AM) The physical exam findings are as follows: Note:General: Somewhat chronically ill but alert and no distress Abdomen: Soft and nontender. Incisions well-healed    Assessment & Plan James Kitchen T. [name] [name]; 03/31/2015 10:48 AM) POSTOP CHECK (V67.00  Z09) Impression: Status post laparoscopic gastrojejunostomy for malignant obstruction of the duodenum. I suspect his bypass is open as he is able to eat moderately and is much improved from  preoperatively. I would not get any studies of his stomach unless he worsened and hopefully things will continue to improve for the next few weeks as he recovers from surgery.  Dr Benay Spice has requested Port-A-Cath placement.  This is discussed with  the patient including the indications and risks of bleeding, infection, thrombosis.

## 2015-04-06 NOTE — Telephone Encounter (Signed)
Pt called requesting EMLA cream for his new port placed 04/05/15 to his pharmacy [facility] in [place]. Verbal order from Ned Card, NP. Called and informed pt it will be at his pharmacy.

## 2015-04-09 ENCOUNTER — Other Ambulatory Visit: Payer: Self-pay | Admitting: Oncology

## 2015-04-11 ENCOUNTER — Ambulatory Visit (HOSPITAL_BASED_OUTPATIENT_CLINIC_OR_DEPARTMENT_OTHER): Payer: Medicare Other

## 2015-04-11 ENCOUNTER — Other Ambulatory Visit: Payer: TRICARE For Life (TFL)

## 2015-04-11 ENCOUNTER — Ambulatory Visit (HOSPITAL_COMMUNITY)
Admission: RE | Admit: 2015-04-11 | Discharge: 2015-04-11 | Disposition: A | Discharge status: Home / Self Care | Payer: Medicare Other | Source: Ambulatory Visit | Attending: Oncology | Admitting: Oncology

## 2015-04-11 ENCOUNTER — Other Ambulatory Visit: Payer: Self-pay | Admitting: Medical Oncology

## 2015-04-11 ENCOUNTER — Ambulatory Visit: Payer: Medicare Other | Admitting: Nutrition

## 2015-04-11 ENCOUNTER — Encounter: Payer: TRICARE For Life (TFL) | Admitting: Nutrition

## 2015-04-11 ENCOUNTER — Other Ambulatory Visit (HOSPITAL_BASED_OUTPATIENT_CLINIC_OR_DEPARTMENT_OTHER): Payer: Medicare Other

## 2015-04-11 VITALS — BP 128/58 | HR 80 | Temp 97.9°F | Resp 18

## 2015-04-11 DIAGNOSIS — C170 Malignant neoplasm of duodenum: Secondary | ICD-10-CM

## 2015-04-11 DIAGNOSIS — C679 Malignant neoplasm of bladder, unspecified: Secondary | ICD-10-CM

## 2015-04-11 DIAGNOSIS — C787 Secondary malignant neoplasm of liver and intrahepatic bile duct: Secondary | ICD-10-CM

## 2015-04-11 DIAGNOSIS — Z5111 Encounter for antineoplastic chemotherapy: Secondary | ICD-10-CM

## 2015-04-11 DIAGNOSIS — D6489 Other specified anemias: Secondary | ICD-10-CM

## 2015-04-11 LAB — CBC WITH DIFFERENTIAL/PLATELET
BASO%: 0.2 % (ref 0.0–2.0)
Basophils Absolute: 0 10*3/uL (ref 0.0–0.1)
EOS%: 0.2 % (ref 0.0–7.0)
Eosinophils Absolute: 0 10*3/uL (ref 0.0–0.5)
HCT: 27.1 % — ABNORMAL LOW (ref 38.4–49.9)
HGB: 8.3 g/dL — ABNORMAL LOW (ref 13.0–17.1)
LYMPH%: 6 % — ABNORMAL LOW (ref 14.0–49.0)
MCH: 28 pg (ref 27.2–33.4)
MCHC: 30.6 g/dL — ABNORMAL LOW (ref 32.0–36.0)
MCV: 91.6 fL (ref 79.3–98.0)
MONO#: 1.2 10*3/uL — AB (ref 0.1–0.9)
MONO%: 7.2 % (ref 0.0–14.0)
NEUT#: 14.1 10*3/uL — ABNORMAL HIGH (ref 1.5–6.5)
NEUT%: 86.4 % — ABNORMAL HIGH (ref 39.0–75.0)
Platelets: 514 10*3/uL — ABNORMAL HIGH (ref 140–400)
RBC: 2.96 10*6/uL — AB (ref 4.20–5.82)
RDW: 20.7 % — ABNORMAL HIGH (ref 11.0–14.6)
WBC: 16.3 10*3/uL — AB (ref 4.0–10.3)
lymph#: 1 10*3/uL (ref 0.9–3.3)

## 2015-04-11 LAB — COMPREHENSIVE METABOLIC PANEL (CC13)
ALK PHOS: 501 U/L — AB (ref 40–150)
ALT: 76 U/L — AB (ref 0–55)
ANION GAP: 19 meq/L — AB (ref 3–11)
AST: 40 U/L — ABNORMAL HIGH (ref 5–34)
Albumin: 3 g/dL — ABNORMAL LOW (ref 3.5–5.0)
BILIRUBIN TOTAL: 1.3 mg/dL — AB (ref 0.20–1.20)
BUN: 37.3 mg/dL — ABNORMAL HIGH (ref 7.0–26.0)
CO2: 17 meq/L — AB (ref 22–29)
Calcium: 9 mg/dL (ref 8.4–10.4)
Chloride: 100 mEq/L (ref 98–109)
Creatinine: 1.5 mg/dL — ABNORMAL HIGH (ref 0.7–1.3)
EGFR: 43 mL/min/{1.73_m2} — ABNORMAL LOW (ref >90)
Glucose: 210 mg/dl — ABNORMAL HIGH (ref 70–140)
Potassium: 4.2 mEq/L (ref 3.5–5.1)
SODIUM: 135 meq/L — AB (ref 136–145)
Total Protein: 6.6 g/dL (ref 6.4–8.3)

## 2015-04-11 MED ORDER — OXALIPLATIN CHEMO INJECTION 100 MG/20ML
85.0000 mg/m2 | Freq: Once | INTRAVENOUS | Status: AC
  Administered 2015-04-11: 190 mg via INTRAVENOUS
  Filled 2015-04-11: qty 38

## 2015-04-11 MED ORDER — SODIUM CHLORIDE 0.9 % IV SOLN
Freq: Once | INTRAVENOUS | Status: AC
  Administered 2015-04-11: 15:00:00 via INTRAVENOUS
  Filled 2015-04-11: qty 4

## 2015-04-11 MED ORDER — SODIUM CHLORIDE 0.9 % IV SOLN
1680.0000 mg/m2 | INTRAVENOUS | Status: DC
  Administered 2015-04-11: 3800 mg via INTRAVENOUS
  Filled 2015-04-11: qty 76

## 2015-04-11 MED ORDER — FLUOROURACIL CHEMO INJECTION 2.5 GM/50ML
280.0000 mg/m2 | Freq: Once | INTRAVENOUS | Status: AC
  Administered 2015-04-11: 650 mg via INTRAVENOUS
  Filled 2015-04-11: qty 13

## 2015-04-11 MED ORDER — FLUOROURACIL CHEMO INJECTION 2.5 GM/50ML
400.0000 mg/m2 | Freq: Once | INTRAVENOUS | Status: DC
  Filled 2015-04-11: qty 18

## 2015-04-11 MED ORDER — DEXTROSE 5 % IV SOLN
Freq: Once | INTRAVENOUS | Status: AC
  Administered 2015-04-11: 14:00:00 via INTRAVENOUS

## 2015-04-11 MED ORDER — DEXTROSE 5 % IV SOLN
400.0000 mg/m2 | Freq: Once | INTRAVENOUS | Status: AC
  Administered 2015-04-11: 900 mg via INTRAVENOUS
  Filled 2015-04-11: qty 45

## 2015-04-11 MED ORDER — SODIUM CHLORIDE 0.9 % IV SOLN
2400.0000 mg/m2 | INTRAVENOUS | Status: DC
  Filled 2015-04-11: qty 108

## 2015-04-11 NOTE — Progress Notes (Signed)
Ok to treat per MD Magrinat

## 2015-04-11 NOTE — Progress Notes (Signed)
81 year old male diagnosed with duodenal mass and gastric outlet obstruction status post palliative gastrojejunostomy.  Past medical history includes anemia, DVT, CAD, melanoma and bladder cancer.  Medications include vitamin D 3, Lasix, multivitamin, Protonix, and prednisone.  Labs include sodium 134, glucose 171.  Height: 5 feet 11 inches. Weight: 207 pounds April 13. Usual body weight: 250 pounds December 2015. BMI: 28.88.  Patient is consuming some liquids. Reports the only oral supplement he tolerates is ensure Enlive.   He complains of metallic taste.  Nutrition diagnosis: Malnutrition related to inadequate oral intake as evidenced by 17% weight loss in 4 months and less than 75% of estimated energy needs for greater than one month.  Intervention:  Educated patient to increase ensure Enlive to a minimum of 3 times daily.   Recommended patient try clear liquid oral nutrition supplements such as boost breeze and ensure clear. Provided samples and coupons. Reviewed soft bland diet with patient and daughter. Educated patient on strategies for improving altered taste. Provided fact sheets. Questions were answered.  Teach back method used.  Contact information provided.  Monitoring, evaluation, goals: Patient will increase oral intake to minimize further weight loss and improve quality-of-life.  Next visit: Patient will contact me for questions or concerns.  **Disclaimer: This note was dictated with voice recognition software. Similar sounding words can inadvertently be transcribed and this note may contain transcription errors which may not have been corrected upon publication of note.**

## 2015-04-11 NOTE — Patient Instructions (Signed)
[facility] Discharge Instructions for Patients Receiving Chemotherapy  Today you received the following chemotherapy agents FOLFOX.   To help prevent nausea and vomiting after your treatment, we encourage you to take your nausea medication as directed.    If you develop nausea and vomiting that is not controlled by your nausea medication, call the clinic.   BELOW ARE SYMPTOMS THAT SHOULD BE REPORTED IMMEDIATELY:  *FEVER GREATER THAN 100.5 F  *CHILLS WITH OR WITHOUT FEVER  NAUSEA AND VOMITING THAT IS NOT CONTROLLED WITH YOUR NAUSEA MEDICATION  *UNUSUAL SHORTNESS OF BREATH  *UNUSUAL BRUISING OR BLEEDING  TENDERNESS IN MOUTH AND THROAT WITH OR WITHOUT PRESENCE OF ULCERS  *URINARY PROBLEMS  *BOWEL PROBLEMS  UNUSUAL RASH Items with * indicate a potential emergency and should be followed up as soon as possible.  Feel free to call the clinic you have any questions or concerns. The clinic phone number is [phone].  Please show the CHEMO ALERT CARD at check-in to the Emergency Department and triage nurse.   

## 2015-04-12 ENCOUNTER — Telehealth: Payer: Self-pay | Admitting: *Deleted

## 2015-04-12 ENCOUNTER — Telehealth: Payer: Self-pay

## 2015-04-12 ENCOUNTER — Encounter (HOSPITAL_COMMUNITY): Payer: Self-pay | Admitting: *Deleted

## 2015-04-12 ENCOUNTER — Emergency Department (HOSPITAL_COMMUNITY): Payer: Medicare Other

## 2015-04-12 ENCOUNTER — Inpatient Hospital Stay (HOSPITAL_COMMUNITY)
Admission: EM | Admit: 2015-04-12 | Discharge: 2015-04-17 | DRG: 947 | Disposition: A | Discharge status: Home / Self Care | Payer: Medicare Other | Attending: Family Medicine | Admitting: Family Medicine

## 2015-04-12 DIAGNOSIS — N400 Enlarged prostate without lower urinary tract symptoms: Secondary | ICD-10-CM | POA: Diagnosis present

## 2015-04-12 DIAGNOSIS — C170 Malignant neoplasm of duodenum: Secondary | ICD-10-CM | POA: Diagnosis present

## 2015-04-12 DIAGNOSIS — Z882 Allergy status to sulfonamides status: Secondary | ICD-10-CM | POA: Diagnosis not present

## 2015-04-12 DIAGNOSIS — N179 Acute kidney failure, unspecified: Secondary | ICD-10-CM | POA: Diagnosis present

## 2015-04-12 DIAGNOSIS — D6481 Anemia due to antineoplastic chemotherapy: Secondary | ICD-10-CM | POA: Diagnosis present

## 2015-04-12 DIAGNOSIS — R4182 Altered mental status, unspecified: Secondary | ICD-10-CM | POA: Diagnosis not present

## 2015-04-12 DIAGNOSIS — C787 Secondary malignant neoplasm of liver and intrahepatic bile duct: Secondary | ICD-10-CM | POA: Diagnosis present

## 2015-04-12 DIAGNOSIS — Z86010 Personal history of colonic polyps: Secondary | ICD-10-CM

## 2015-04-12 DIAGNOSIS — I2510 Atherosclerotic heart disease of native coronary artery without angina pectoris: Secondary | ICD-10-CM | POA: Diagnosis present

## 2015-04-12 DIAGNOSIS — Z888 Allergy status to other drugs, medicaments and biological substances status: Secondary | ICD-10-CM | POA: Diagnosis not present

## 2015-04-12 DIAGNOSIS — E039 Hypothyroidism, unspecified: Secondary | ICD-10-CM | POA: Diagnosis present

## 2015-04-12 DIAGNOSIS — K7682 Hepatic encephalopathy: Secondary | ICD-10-CM

## 2015-04-12 DIAGNOSIS — Z86718 Personal history of other venous thrombosis and embolism: Secondary | ICD-10-CM | POA: Diagnosis not present

## 2015-04-12 DIAGNOSIS — D6489 Other specified anemias: Secondary | ICD-10-CM

## 2015-04-12 DIAGNOSIS — K311 Adult hypertrophic pyloric stenosis: Secondary | ICD-10-CM | POA: Diagnosis present

## 2015-04-12 DIAGNOSIS — Z961 Presence of intraocular lens: Secondary | ICD-10-CM | POA: Diagnosis present

## 2015-04-12 DIAGNOSIS — K920 Hematemesis: Secondary | ICD-10-CM | POA: Diagnosis present

## 2015-04-12 DIAGNOSIS — Z85820 Personal history of malignant melanoma of skin: Secondary | ICD-10-CM

## 2015-04-12 DIAGNOSIS — Z955 Presence of coronary angioplasty implant and graft: Secondary | ICD-10-CM

## 2015-04-12 DIAGNOSIS — Z96652 Presence of left artificial knee joint: Secondary | ICD-10-CM | POA: Diagnosis present

## 2015-04-12 DIAGNOSIS — Z880 Allergy status to penicillin: Secondary | ICD-10-CM

## 2015-04-12 DIAGNOSIS — I5032 Chronic diastolic (congestive) heart failure: Secondary | ICD-10-CM | POA: Diagnosis present

## 2015-04-12 DIAGNOSIS — Z6828 Body mass index (BMI) 28.0-28.9, adult: Secondary | ICD-10-CM | POA: Diagnosis not present

## 2015-04-12 DIAGNOSIS — Z9842 Cataract extraction status, left eye: Secondary | ICD-10-CM

## 2015-04-12 DIAGNOSIS — I8500 Esophageal varices without bleeding: Secondary | ICD-10-CM | POA: Diagnosis present

## 2015-04-12 DIAGNOSIS — D649 Anemia, unspecified: Secondary | ICD-10-CM | POA: Diagnosis present

## 2015-04-12 DIAGNOSIS — R591 Generalized enlarged lymph nodes: Secondary | ICD-10-CM | POA: Diagnosis present

## 2015-04-12 DIAGNOSIS — G629 Polyneuropathy, unspecified: Secondary | ICD-10-CM | POA: Diagnosis present

## 2015-04-12 DIAGNOSIS — Z9841 Cataract extraction status, right eye: Secondary | ICD-10-CM

## 2015-04-12 DIAGNOSIS — Z87891 Personal history of nicotine dependence: Secondary | ICD-10-CM | POA: Diagnosis not present

## 2015-04-12 DIAGNOSIS — E860 Dehydration: Secondary | ICD-10-CM | POA: Diagnosis present

## 2015-04-12 DIAGNOSIS — I509 Heart failure, unspecified: Secondary | ICD-10-CM

## 2015-04-12 DIAGNOSIS — K219 Gastro-esophageal reflux disease without esophagitis: Secondary | ICD-10-CM | POA: Diagnosis present

## 2015-04-12 DIAGNOSIS — D693 Immune thrombocytopenic purpura: Secondary | ICD-10-CM | POA: Diagnosis present

## 2015-04-12 DIAGNOSIS — T451X5A Adverse effect of antineoplastic and immunosuppressive drugs, initial encounter: Secondary | ICD-10-CM | POA: Diagnosis present

## 2015-04-12 DIAGNOSIS — R531 Weakness: Secondary | ICD-10-CM | POA: Diagnosis present

## 2015-04-12 DIAGNOSIS — Z7952 Long term (current) use of systemic steroids: Secondary | ICD-10-CM | POA: Diagnosis not present

## 2015-04-12 DIAGNOSIS — D638 Anemia in other chronic diseases classified elsewhere: Secondary | ICD-10-CM | POA: Diagnosis present

## 2015-04-12 DIAGNOSIS — K7290 Hepatic failure, unspecified without coma: Secondary | ICD-10-CM

## 2015-04-12 DIAGNOSIS — C786 Secondary malignant neoplasm of retroperitoneum and peritoneum: Secondary | ICD-10-CM | POA: Diagnosis not present

## 2015-04-12 DIAGNOSIS — M109 Gout, unspecified: Secondary | ICD-10-CM | POA: Diagnosis present

## 2015-04-12 DIAGNOSIS — Z8672 Personal history of thrombophlebitis: Secondary | ICD-10-CM | POA: Diagnosis not present

## 2015-04-12 DIAGNOSIS — D500 Iron deficiency anemia secondary to blood loss (chronic): Secondary | ICD-10-CM | POA: Diagnosis not present

## 2015-04-12 DIAGNOSIS — Z9861 Coronary angioplasty status: Secondary | ICD-10-CM

## 2015-04-12 DIAGNOSIS — I81 Portal vein thrombosis: Secondary | ICD-10-CM | POA: Diagnosis present

## 2015-04-12 DIAGNOSIS — D72829 Elevated white blood cell count, unspecified: Secondary | ICD-10-CM | POA: Diagnosis present

## 2015-04-12 DIAGNOSIS — I10 Essential (primary) hypertension: Secondary | ICD-10-CM | POA: Diagnosis present

## 2015-04-12 DIAGNOSIS — G9340 Encephalopathy, unspecified: Secondary | ICD-10-CM | POA: Diagnosis present

## 2015-04-12 DIAGNOSIS — E780 Pure hypercholesterolemia: Secondary | ICD-10-CM | POA: Diagnosis present

## 2015-04-12 DIAGNOSIS — Z7982 Long term (current) use of aspirin: Secondary | ICD-10-CM

## 2015-04-12 DIAGNOSIS — C679 Malignant neoplasm of bladder, unspecified: Secondary | ICD-10-CM | POA: Diagnosis not present

## 2015-04-12 DIAGNOSIS — R188 Other ascites: Secondary | ICD-10-CM

## 2015-04-12 LAB — CBC WITH DIFFERENTIAL/PLATELET
Basophils Absolute: 0 10*3/uL (ref 0.0–0.1)
Basophils Relative: 0 % (ref 0–1)
Eosinophils Absolute: 0 10*3/uL (ref 0.0–0.7)
Eosinophils Relative: 0 % (ref 0–5)
HCT: 25.1 % — ABNORMAL LOW (ref 39.0–52.0)
Hemoglobin: 7.7 g/dL — ABNORMAL LOW (ref 13.0–17.0)
LYMPHS ABS: 1.3 10*3/uL (ref 0.7–4.0)
LYMPHS PCT: 6 % — AB (ref 12–46)
MCH: 27.8 pg (ref 26.0–34.0)
MCHC: 30.7 g/dL (ref 30.0–36.0)
MCV: 90.6 fL (ref 78.0–100.0)
MONOS PCT: 7 % (ref 3–12)
Monocytes Absolute: 1.4 10*3/uL — ABNORMAL HIGH (ref 0.1–1.0)
NEUTROS PCT: 87 % — AB (ref 43–77)
Neutro Abs: 17.1 10*3/uL — ABNORMAL HIGH (ref 1.7–7.7)
Platelets: 475 10*3/uL — ABNORMAL HIGH (ref 150–400)
RBC: 2.77 MIL/uL — AB (ref 4.22–5.81)
RDW: 20.4 % — ABNORMAL HIGH (ref 11.5–15.5)
WBC: 19.7 10*3/uL — AB (ref 4.0–10.5)

## 2015-04-12 LAB — URINALYSIS, ROUTINE W REFLEX MICROSCOPIC
BILIRUBIN URINE: NEGATIVE
Glucose, UA: NEGATIVE mg/dL
HGB URINE DIPSTICK: NEGATIVE
Ketones, ur: NEGATIVE mg/dL
Leukocytes, UA: NEGATIVE
Nitrite: NEGATIVE
Protein, ur: NEGATIVE mg/dL
Specific Gravity, Urine: 1.02 (ref 1.005–1.030)
UROBILINOGEN UA: 0.2 mg/dL (ref 0.0–1.0)
pH: 5.5 (ref 5.0–8.0)

## 2015-04-12 LAB — PREPARE RBC (CROSSMATCH)

## 2015-04-12 LAB — I-STAT CG4 LACTIC ACID, ED: Lactic Acid, Venous: 2.02 mmol/L (ref 0.5–2.0)

## 2015-04-12 LAB — COMPREHENSIVE METABOLIC PANEL
ALBUMIN: 3 g/dL — AB (ref 3.5–5.2)
ALT: 78 U/L — ABNORMAL HIGH (ref 0–53)
AST: 53 U/L — ABNORMAL HIGH (ref 0–37)
Alkaline Phosphatase: 382 U/L — ABNORMAL HIGH (ref 39–117)
Anion gap: 12 (ref 5–15)
BUN: 55 mg/dL — AB (ref 6–23)
CO2: 22 mmol/L (ref 19–32)
CREATININE: 1.84 mg/dL — AB (ref 0.50–1.35)
Calcium: 8.9 mg/dL (ref 8.4–10.5)
Chloride: 101 mmol/L (ref 96–112)
GFR calc Af Amer: 37 mL/min — ABNORMAL LOW (ref >90)
GFR, EST NON AFRICAN AMERICAN: 32 mL/min — AB (ref >90)
Glucose, Bld: 146 mg/dL — ABNORMAL HIGH (ref 70–99)
Potassium: 4.6 mmol/L (ref 3.5–5.1)
Sodium: 135 mmol/L (ref 135–145)
Total Bilirubin: 1.3 mg/dL — ABNORMAL HIGH (ref 0.3–1.2)
Total Protein: 6.5 g/dL (ref 6.0–8.3)

## 2015-04-12 LAB — CEA: CEA: 1.2 ng/mL (ref 0.0–5.0)

## 2015-04-12 LAB — AMMONIA: Ammonia: 50 umol/L — ABNORMAL HIGH (ref 11–32)

## 2015-04-12 MED ORDER — OXYCODONE-ACETAMINOPHEN 5-325 MG PO TABS
1.0000 | ORAL_TABLET | ORAL | Status: DC | PRN
  Filled 2015-04-12: qty 1

## 2015-04-12 MED ORDER — POLYSACCHARIDE IRON COMPLEX 150 MG PO CAPS
150.0000 mg | ORAL_CAPSULE | Freq: Two times a day (BID) | ORAL | Status: DC
  Administered 2015-04-13 – 2015-04-17 (×6): 150 mg via ORAL
  Filled 2015-04-12 (×11): qty 1

## 2015-04-12 MED ORDER — ONDANSETRON HCL 4 MG/2ML IJ SOLN
4.0000 mg | Freq: Four times a day (QID) | INTRAMUSCULAR | Status: DC | PRN
  Administered 2015-04-13 (×3): 4 mg via INTRAVENOUS
  Filled 2015-04-12 (×3): qty 2

## 2015-04-12 MED ORDER — ASPIRIN EC 81 MG PO TBEC
81.0000 mg | DELAYED_RELEASE_TABLET | Freq: Every evening | ORAL | Status: DC
  Filled 2015-04-12: qty 1

## 2015-04-12 MED ORDER — ADULT MULTIVITAMIN W/MINERALS CH
1.0000 | ORAL_TABLET | Freq: Every day | ORAL | Status: DC
  Administered 2015-04-13 – 2015-04-17 (×4): 1 via ORAL
  Filled 2015-04-12 (×4): qty 1

## 2015-04-12 MED ORDER — ONDANSETRON HCL 4 MG PO TABS: 4.0000 mg | ORAL_TABLET | Freq: Four times a day (QID) | ORAL | Status: DC | PRN

## 2015-04-12 MED ORDER — NITROGLYCERIN 0.4 MG SL SUBL: 0.4000 mg | SUBLINGUAL_TABLET | SUBLINGUAL | Status: DC | PRN

## 2015-04-12 MED ORDER — LACTULOSE 10 GM/15ML PO SOLN
10.0000 g | Freq: Once | ORAL | Status: AC
  Administered 2015-04-13: 10 g via ORAL
  Filled 2015-04-12: qty 15

## 2015-04-12 MED ORDER — SODIUM CHLORIDE 0.9 % IV SOLN
Freq: Once | INTRAVENOUS | Status: AC
  Administered 2015-04-13: 01:00:00 via INTRAVENOUS

## 2015-04-12 MED ORDER — PSYLLIUM 95 % PO PACK
1.0000 | PACK | Freq: Every day | ORAL | Status: DC
  Filled 2015-04-12 (×5): qty 1

## 2015-04-12 MED ORDER — PREDNISONE 5 MG PO TABS
5.0000 mg | ORAL_TABLET | Freq: Every day | ORAL | Status: DC
  Administered 2015-04-13 – 2015-04-17 (×4): 5 mg via ORAL
  Filled 2015-04-12: qty 0.5
  Filled 2015-04-12 (×2): qty 1
  Filled 2015-04-12: qty 0.5
  Filled 2015-04-12: qty 1

## 2015-04-12 MED ORDER — ENOXAPARIN SODIUM 40 MG/0.4ML ~~LOC~~ SOLN
40.0000 mg | Freq: Every day | SUBCUTANEOUS | Status: DC
  Administered 2015-04-13: 40 mg via SUBCUTANEOUS
  Filled 2015-04-12 (×2): qty 0.4

## 2015-04-12 MED ORDER — PANTOPRAZOLE SODIUM 40 MG PO TBEC
40.0000 mg | DELAYED_RELEASE_TABLET | Freq: Every day | ORAL | Status: DC
  Administered 2015-04-13: 40 mg via ORAL
  Filled 2015-04-12: qty 1

## 2015-04-12 MED ORDER — SPIRONOLACTONE 25 MG PO TABS
25.0000 mg | ORAL_TABLET | Freq: Every day | ORAL | Status: DC
  Filled 2015-04-12: qty 1

## 2015-04-12 MED ORDER — FINASTERIDE 5 MG PO TABS
5.0000 mg | ORAL_TABLET | Freq: Every day | ORAL | Status: DC
  Administered 2015-04-13 – 2015-04-17 (×4): 5 mg via ORAL
  Filled 2015-04-12 (×4): qty 1

## 2015-04-12 MED ORDER — ACETAMINOPHEN 325 MG PO TABS: 650.0000 mg | ORAL_TABLET | Freq: Four times a day (QID) | ORAL | Status: DC | PRN

## 2015-04-12 MED ORDER — ENSURE ENLIVE PO LIQD
237.0000 mL | Freq: Three times a day (TID) | ORAL | Status: DC
  Administered 2015-04-14 – 2015-04-17 (×7): 237 mL via ORAL

## 2015-04-12 MED ORDER — FAMOTIDINE 20 MG PO TABS
20.0000 mg | ORAL_TABLET | Freq: Two times a day (BID) | ORAL | Status: DC
  Administered 2015-04-13: 20 mg via ORAL
  Filled 2015-04-12 (×3): qty 1

## 2015-04-12 MED ORDER — PROCHLORPERAZINE MALEATE 10 MG PO TABS: 5.0000 mg | ORAL_TABLET | Freq: Four times a day (QID) | ORAL | Status: DC | PRN

## 2015-04-12 MED ORDER — FUROSEMIDE 40 MG PO TABS
40.0000 mg | ORAL_TABLET | Freq: Every day | ORAL | Status: DC
  Filled 2015-04-12: qty 1

## 2015-04-12 MED ORDER — SODIUM CHLORIDE 0.9 % IV BOLUS (SEPSIS)
500.0000 mL | Freq: Once | INTRAVENOUS | Status: AC
  Administered 2015-04-12: 500 mL via INTRAVENOUS

## 2015-04-12 MED ORDER — ACETAMINOPHEN 650 MG RE SUPP: 650.0000 mg | Freq: Four times a day (QID) | RECTAL | Status: DC | PRN

## 2015-04-12 MED ORDER — SODIUM CHLORIDE 0.9 % IJ SOLN
3.0000 mL | Freq: Two times a day (BID) | INTRAMUSCULAR | Status: DC
  Administered 2015-04-14 – 2015-04-17 (×6): 3 mL via INTRAVENOUS

## 2015-04-12 MED ORDER — PRAVASTATIN SODIUM 20 MG PO TABS
20.0000 mg | ORAL_TABLET | Freq: Every day | ORAL | Status: DC
  Administered 2015-04-14 – 2015-04-16 (×3): 20 mg via ORAL
  Filled 2015-04-12 (×4): qty 1

## 2015-04-12 NOTE — ED Notes (Signed)
Patient transported to CT 

## 2015-04-12 NOTE — ED Notes (Signed)
Bed: WA21 Expected date:  Expected time:  Means of arrival:  Comments: Ems  

## 2015-04-12 NOTE — ED Notes (Signed)
Patient is with CT right now, unable to get updated vitals at this time.

## 2015-04-12 NOTE — ED Notes (Signed)
EDP made aware of patient James [name] lactic result. 

## 2015-04-12 NOTE — ED Notes (Signed)
CAUTION---CHEMO BAG ATTACHED TO UPPER BED RT SHOULDER OF PT. INFUSING CHEMO/ PT SUPPLY VIA PORTA CATH.

## 2015-04-12 NOTE — Telephone Encounter (Signed)
James [name] called stating her father has not wanted to get up all day. She is starting to get concerned. I told her to get him up, make him go to the bathroom, make him drink some water and possibly some ensure. See how he feels, and call us back.

## 2015-04-12 NOTE — Telephone Encounter (Addendum)
Debbie called and stated her father is in a hard sleep. She did not get him out of bed. Attempted to transfer call to [facility], but it did not go through. Called Debbie that I spoke with Oren Beckmann and she will talk with Doree Albee, NP, then call her back.

## 2015-04-12 NOTE — ED Provider Notes (Signed)
CSN: [id number]     Arrival date & time 04/12/15  1722 History   First MD Initiated Contact with Patient 04/12/15 1738     Chief Complaint  Patient presents with  . ca pt, weakness      (Consider location/radiation/quality/duration/timing/severity/associated sxs/prior Treatment) HPI  81 year old male with a past medical history of duodenal cancer presents with fatigue, weakness, and intermittent confusion. This is been gone since this morning. Received his first dose of chemotherapy yesterday. A few weeks ago he had extensive surgery to remove an obstructing mass from his duodenal cancer. Patient today is sleepier than normal and has been moderately confused. Daughters at the bedside on most of the history. They state that usually he is awake and alert and has the mind of a 81 year old. He has the energy of a 81 year old. Both of these are significantly changed today. Concern for a UTI as he has had these in the past. No complaints of urinary symptoms. No fevers.  Past Medical History  Diagnosis Date  . Bradycardia     Hypertensive  . carotid bruit, right   . hypercholesterolemia     Trig 234/293;takes Simcor daily  . Obesity, morbid   . Vasculitis   . 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 Beryle Beams; Dr. Beryle Beams , normal platelets  . Dermatitis     legs  . Bleeding gums   . Chronic low back pain   . Myalgia and myositis   . Unspecified vitamin D deficiency   . Bladder diverticulum   . Primary osteoarthritis of both knees   . Nephrolithiasis   . History of elevated glucose   . Thrombophlebitis     right forearm  . History of BPH   . History of colonic polyps     [name]  . Epididymitis, left     S/P Epidimectomy  . Splenomegaly 05/15/09    abd U/S NML  . Hypertension, benign     takes Micardis daily  . Neuropathy     acute  . Bruises easily   . H/O hiatal hernia   . GERD (gastroesophageal reflux disease)     hx of but  doesn't take any meds now  . Urinary frequency   . Urinary urgency   . Nocturia   . Urinary leakage   . Abnormality of gait 03/19/2013  . DVT of lower limb, acute 11/19/2013    Peroneal & distal tibial left 11/01/13  . Polio 1941    "mild case"  . CHF (congestive heart failure)     "low grade" (06/22/2014)  . DVT (deep venous thrombosis) 10/2013    LLE  . Pneumonia, bacterial     RML resolved, spirometry, normal  . Pneumonia 1933  . History of blood transfusion 05/2014  . Anemia 05/2014    "related to Xarelto"  . Iron deficiency anemia 05/2014    "got iron infusion"  . Arthritis     "all over"  . Gout   . Malignant melanoma of ear     right  . Thrombophlebitis of superficial veins of right lower extremity 06/23/2014    Incidental finding doppler 05/30/14  . Shortness of breath   . Esophageal varices without mention of bleeding 08/08/2014  . Portal vein thrombosis   . Duodenal cancer    Past Surgical History  Procedure Laterality Date  . Cystoscopy w/ stone manipulation  1950's  . Fem cutaneous nerve entrapment  1977    due to surgery (mild  lat anesth of bilat legs)  . Limited pelvis/hip bone scan  04/99    negative  . Bone scan  04/00  . Cataract extraction w/ intraocular lens  implant, bilateral Bilateral ~ 2004  . Melanoma excision Right 06/04    ear; Wide local excision,,with flap  . Stress cardiolite  05/11/04    mild inf. ischemia, EF 71%  . Shoulder surgery Left 06/17/2005    "tore it all to pieces when I fell; not fractured"  . Bronchoscopy  09/08    RML collapse with chronic pneumonia, bx neg (Dr. Joya Gaskins)  . Cyst excision Right 03/07/09    middle finger, DIP Joint Mucoid (Dr. Fredna Dow)  . Eyelid & eyebrow lift  1996  . Cyst excision Right 08/15/09    middle finger, DIP Joint Mucoid Cyst (Dr. Fredna Dow)  . Appendectomy  1950  . Excision of mucoid tumor    . Colonoscopy    . Total knee arthroplasty  01/10/2012    Procedure: TOTAL KNEE ARTHROPLASTY;  Surgeon: Augustin Schooling, MD;  Location: [place];  Service: Orthopedics;  Laterality: Left;  . Surgery scrotal / testicular Left ~ 2000    removal of mass, noncancerous  . Cardiac catheterization  05/23/04    mild plague-statin  . Coronary angioplasty with stent placement  06/22/2014    to proximal LAD  . Inguinal hernia repair Bilateral 1959  . Inguinal hernia repair Left 1970's  . Esophagogastroduodenoscopy N/A 08/08/2014    Procedure: ESOPHAGOGASTRODUODENOSCOPY (EGD);  Surgeon: Gatha Mayer, MD;  Location: [facility] ENDOSCOPY;  Service: Endoscopy;  Laterality: N/A;  . Colonoscopy N/A 08/08/2014    Procedure: COLONOSCOPY;  Surgeon: Gatha Mayer, MD;  Location: [place];  Service: Endoscopy;  Laterality: N/A;  . Flexible sigmoidoscopy N/A 09/21/2014    Procedure: FLEXIBLE SIGMOIDOSCOPY;  Surgeon: Jerene Bears, MD;  Location: [place];  Service: Gastroenterology;  Laterality: N/A;  . Givens capsule study N/A 09/21/2014    Procedure: GIVENS CAPSULE STUDY;  Surgeon: Jerene Bears, MD;  Location: [facility] ENDOSCOPY;  Service: Endoscopy;  Laterality: N/A;  . Left and right heart catheterization with coronary angiogram N/A 06/22/2014    Procedure: LEFT AND RIGHT HEART CATHETERIZATION WITH CORONARY ANGIOGRAM;  Surgeon: Larey Dresser, MD;  Location: [facility] CATH LAB;  Service: Cardiovascular;  Laterality: N/A;  . Percutaneous coronary stent intervention (pci-s)  06/22/2014    Procedure: PERCUTANEOUS CORONARY STENT INTERVENTION (PCI-S);  Surgeon: Larey Dresser, MD;  Location: [facility] CATH LAB;  Service: Cardiovascular;;  . Colonoscopy N/A 01/25/2015    Procedure: COLONOSCOPY;  Surgeon: Gatha Mayer, MD;  Location: [facility] ENDOSCOPY;  Service: Endoscopy;  Laterality: N/A;  . Cystoscopy with biopsy Right 02/03/2015    Procedure: CYSTOSCOPY WITH COLD CUP BLADDER BIOPSY CAUTHERIZAITON OF RIGHT BLADDER CANCER AND SATILITE, TURBT RIGHT BLADDER BASE;  Surgeon: Ailene Rud, MD;  Location: [facility] ORS;  Service: Urology;  Laterality: Right;  .  Esophagogastroduodenoscopy N/A 03/03/2015    Procedure: ESOPHAGOGASTRODUODENOSCOPY (EGD);  Surgeon: Jerene Bears, MD;  Location: [facility] ENDOSCOPY;  Service: Gastroenterology;  Laterality: N/A;  . Gastrojejunostomy N/A 03/09/2015    Procedure: LAPAROSCOPIC GASTROJEJUNOSTOMY;  Surgeon: Excell Seltzer, MD;  Location: [facility] ORS;  Service: General;  Laterality: N/A;  . Portacath placement Right 04/05/2015    Procedure: INSERTION PORT-A-CATH;  Surgeon: Excell Seltzer, MD;  Location: [place];  Service: General;  Laterality: Right;   Family History  Problem Relation Age of Onset  . Thyroid cancer Mother   .  Diabetes Father   . Heart disease Father   . Stroke Brother     cerebral hemm  . Colon cancer Brother   . Anesthesia problems Neg Hx   . Hypotension Neg Hx   . Malignant hyperthermia Neg Hx   . Pseudochol deficiency Neg Hx   . Prostate cancer Neg Hx   . Heart attack Father    History  Substance Use Topics  . Smoking status: Former Smoker -- 2.00 packs/day for 20 years    Types: Cigarettes, Cigars    Quit date: 09/27/1967  . Smokeless tobacco: Never Used  . Alcohol Use: No    Review of Systems  Unable to perform ROS: Mental status change      Allergies  Sulfa antibiotics; Aleve; Rofecoxib; Sulfamethoxazole-trimethoprim; Tape; Tramadol hcl; Neomycin-bacitracin zn-polymyx; Neosporin; and Penicillins  Home Medications   Prior to Admission medications   Medication Sig Start Date End Date Taking? Authorizing Provider  aspirin EC 81 MG tablet Take 81 mg by mouth every evening.    Yes Historical Provider, MD  Cholecalciferol (VITAMIN D3) 2000 UNITS capsule Take 2,000 Units by mouth every morning.    Yes Historical Provider, MD  feeding supplement, ENSURE ENLIVE, (ENSURE ENLIVE) LIQD Take 237 mLs by mouth 3 (three) times daily between meals. 03/16/15  Yes Debbe Odea, MD  finasteride (PROSCAR) 5 MG tablet Take 5 mg by mouth daily.   Yes Historical Provider, MD   furosemide (LASIX) 40 MG tablet Take 1 tablet (40 mg total) by mouth daily. 02/28/15  Yes Larey Dresser, MD  iron polysaccharides (NIFEREX) 150 MG capsule Take 150 mg by mouth 2 (two) times daily.   Yes Historical Provider, MD  lidocaine-prilocaine (EMLA) cream Apply 1 application topically as needed. 04/06/15  Yes Owens Shark, NP  LIVALO 4 MG TABS TAKE 1 TABLET AT BEDTIME 10/03/14  Yes Jolaine Artist, MD  Multiple Vitamin (MULTIVITAMIN WITH MINERALS) TABS tablet Take 1 tablet by mouth daily.   Yes Historical Provider, MD  OVER THE COUNTER MEDICATION Place 2 drops into both eyes 4 (four) times daily. Wash type eye drop-   Yes Historical Provider, MD  oxyCODONE-acetaminophen (ROXICET) 5-325 MG per tablet Take 1 tablet by mouth every 4 (four) hours as needed for severe pain. 03/21/15  Yes Ladell Pier, MD  pantoprazole (PROTONIX) 40 MG tablet Take 40 mg by mouth daily.    Yes Historical Provider, MD  predniSONE (DELTASONE) 5 MG tablet Take 1 tablet (5 mg total) by mouth daily with breakfast. 03/31/15  Yes Owens Shark, NP  Probiotic Product (PROBIOTIC & ACIDOPHILUS EX ST PO) Take 1 tablet by mouth every morning.    Yes Historical Provider, MD  prochlorperazine (COMPAZINE) 5 MG tablet Take 1 tablet (5 mg total) by mouth every 6 (six) hours as needed for nausea or vomiting. 03/21/15  Yes Ladell Pier, MD  Psyllium (METAMUCIL PO) Take 20 mLs by mouth every evening.   Yes Historical Provider, MD  ranitidine (ZANTAC) 150 MG tablet Take 150 mg by mouth at bedtime.   Yes Historical Provider, MD  spironolactone (ALDACTONE) 25 MG tablet Take 1 tablet (25 mg total) by mouth daily. 10/26/14  Yes Larey Dresser, MD  allopurinol (ZYLOPRIM) 300 MG tablet Take 0.5 tablets (150 mg total) by mouth every morning. 10/31/14   Tonia Ghent, MD  allopurinol (ZYLOPRIM) 300 MG tablet TAKE 1 TABLET DAILY 04/03/15   Tonia Ghent, MD  nitroGLYCERIN (NITROSTAT) 0.4 MG SL tablet Place  1 tablet (0.4 mg total) under  the tongue every 5 (five) minutes as needed. For chest pain Patient not taking: Reported on 03/31/2015 07/07/14   Larey Dresser, MD   BP 107/51 mmHg  Pulse 62  Temp(Src) 97.6 F (36.4 C) (Oral)  Resp 18  SpO2 100% Physical Exam  Constitutional: He appears well-developed and well-nourished.  HENT:  Head: Normocephalic and atraumatic.  Right Ear: External ear normal.  Left Ear: External ear normal.  Nose: Nose normal.  Eyes: EOM are normal. Pupils are equal, round, and reactive to light. Right eye exhibits no discharge. Left eye exhibits no discharge.  Neck: Neck supple.  Cardiovascular: Normal rate, regular rhythm, normal heart sounds and intact distal pulses.   Pulmonary/Chest: Effort normal. He has no wheezes. He has no rales.  Abdominal: Soft. There is tenderness (mild diffuse tenderness).  Musculoskeletal: He exhibits no edema.  Neurological: He is alert. He is disoriented. GCS eye subscore is 4. GCS verbal subscore is 4. GCS motor subscore is 6.  Normal strength in all 4 extremities. Awake, alert but is confused and does not answer questions appropriately  Skin: Skin is warm and dry.  Nursing note and vitals reviewed.   ED Course  Procedures (including critical care time) Labs Review Labs Reviewed  CBC WITH DIFFERENTIAL/PLATELET - Abnormal; Notable for the following:    WBC 19.7 (*)    RBC 2.77 (*)    Hemoglobin 7.7 (*)    HCT 25.1 (*)    RDW 20.4 (*)    Platelets 475 (*)    Neutrophils Relative % 87 (*)    Neutro Abs 17.1 (*)    Lymphocytes Relative 6 (*)    Monocytes Absolute 1.4 (*)    All other components within normal limits  COMPREHENSIVE METABOLIC PANEL - Abnormal; Notable for the following:    Glucose, Bld 146 (*)    BUN 55 (*)    Creatinine, Ser 1.84 (*)    Albumin 3.0 (*)    AST 53 (*)    ALT 78 (*)    Alkaline Phosphatase 382 (*)    Total Bilirubin 1.3 (*)    GFR calc non Af Amer 32 (*)    GFR calc Af Amer 37 (*)    All other components within  normal limits  AMMONIA - Abnormal; Notable for the following:    Ammonia 50 (*)    All other components within normal limits  I-STAT CG4 LACTIC ACID, ED - Abnormal; Notable for the following:    Lactic Acid, Venous 2.02 (*)    All other components within normal limits  URINE CULTURE  CULTURE, BLOOD (ROUTINE X 2)  CULTURE, BLOOD (ROUTINE X 2)  URINALYSIS, ROUTINE W REFLEX MICROSCOPIC  COMPREHENSIVE METABOLIC PANEL  CBC WITH DIFFERENTIAL/PLATELET  TROPONIN I  CBC  CK  LACTIC ACID, PLASMA  TYPE AND SCREEN  PREPARE RBC (CROSSMATCH)    Imaging Review Ct Head Wo Contrast  04/12/2015   CLINICAL DATA:  81 year old male with altered mental status. Medical history includes gastric cancer metastatic to the liver and ongoing chemotherapy.  EXAM: CT HEAD WITHOUT CONTRAST  TECHNIQUE: Contiguous axial images were obtained from the base of the skull through the vertex without intravenous contrast.  COMPARISON:  Prior PET-CT 03/29/2015; prior brain MRI 01/06/2013; prior head CT 01/16/2011  FINDINGS: Negative for acute intracranial hemorrhage, acute infarction, mass, mass effect, hydrocephalus or midline shift. Gray-white differentiation is preserved throughout. Cerebral and cerebellar cortical volume loss. Mild chronic microvascular ischemic white  matter disease. No focal soft tissue or calvarial abnormality. Symmetric globes bilaterally. Normal aeration of the mastoid air cells paranasal sinuses. Mild hyperostosis frontalis interna. Intracranial atherosclerosis.  IMPRESSION: No acute intracranial abnormality.  Stable atrophy and mild chronic microvascular ischemic white matter disease.   Electronically Signed   By: Jacqulynn Cadet M.D.   On: 04/12/2015 20:11   Dg Chest Port 1 View  04/12/2015   CLINICAL DATA:  81 year old male with 1 day history of weakness and lethargy. Current clinical history includes gastric cancer metastatic to the liver. Patient received chemotherapy yesterday.  EXAM: PORTABLE  CHEST - 1 VIEW  COMPARISON:  Prior chest x-ray 04/05/2015  FINDINGS: Single-lumen power injectable port catheter via a right internal jugular venous approach. Catheter tip projects over the mid SVC. Mild cardiomegaly. Atherosclerotic calcifications present in the aorta. No focal airspace consolidation, pleural effusion or pneumothorax. No evidence of pulmonary edema. Inspiratory volumes are slightly low with mild bibasilar atelectasis. No acute osseous abnormality.  IMPRESSION: Low inspiratory volumes with minimal bibasilar atelectasis.  No acute cardiopulmonary process.   Electronically Signed   By: Jacqulynn Cadet M.D.   On: 04/12/2015 18:31     EKG Interpretation None      MDM   Final diagnoses:  Weakness  AKI  No obvious cause for change in mental status. Is awake but confused. Mild elevation in ammonia could be contributing, will give dose of lactulose. Could also be the chemotherapy agent he was given earlier. CT head negative, as well as no obvious infectious source on workup. Will admit for fluids and further workup.    Sherwood Gambler, MD 04/13/15 [phone]

## 2015-04-12 NOTE — Telephone Encounter (Signed)
Spoke with pt, he seemed confused. Disoriented to date. Unable to tell me his birth date. Reviewed with Ned Card, NP: instructed pt's daughter to take him to ED to be evaluated due to new onset confusion. [name] voiced understanding. She will call EMS to transport if unable to get him in the car.

## 2015-04-12 NOTE — ED Notes (Signed)
Per ems pt currently being treated for stomach cancer, with hepatic mets. Pt received chemo yesterday, chemo pump connected to PICC line. Pt reports weakness and lethargy x1 day. Denies pain. daughter reports pt has had intermittent confusion. At present alert and oriented x4.  Pt hgb yesterday was 8, scheduled from blood transfusion for Friday.

## 2015-04-12 NOTE — Telephone Encounter (Signed)
Patient's daughter James [name] called reporting her dad is "still in bed. B/P 124/57, P = 57, R = 12 to 14, T = 95.9 with oral digital thermometer.  He is in bed and does not want to be bothered.  Hasn't yelled at [place] but says leave me alone, don't touch.  At this time he is more alert.  He had a bm last night because he didn't flush and we smell his depends so we know his bladder is working.  He left [facility] at 5:30 pm and after the twenty minute ride, he slept off and on before and during dinner.  He went to bed, turned thermostat up to 82 degrees but got up XC/O too hot.  we heard him gagging at the sink.  He got up three hours after adjusting thermostat and not sure after that how many times he got up.  .  We noticed a pulse or twitching below his knee."  Call transferred to collaborative nurse

## 2015-04-12 NOTE — Telephone Encounter (Signed)
Called pt for chemo follow up. Asleep, per Jackelyn Poling.

## 2015-04-12 NOTE — Progress Notes (Signed)
EDCM spoke to patient and his family at bedside.  Patient noted to be admitted and discharged from the hospital from 03/09 to 03/24.  Patient confirms his pcp is Dr. [name].  Patient's daughter Jeannene Patella reports patient has had home health services in the past with [facility] but, "They weren't doing anything that we weren't doing,"  So home health services were stopped.  Patient has a bed at home with rails.  Patient alos has a walker and cane at home which he doesn't get much use of per daughter.  [name] reports patient's house is handicap friendly.  [name] reports she is currently living upstairs from patient.  Patient reports he is able to perform his ADL's on his own.  Patient's daughter reports patient has received a discharge follow up phone call from previous admission and that he has been seen by "many" doctors since his discharge.  Patient is seen by Dr. Learta Codding oncology.  This information was placed in readmission focus note.  Patient reports he does nto have any home health or dme needs at this time.  No further EDCM needs at this time.

## 2015-04-13 ENCOUNTER — Other Ambulatory Visit: Payer: TRICARE For Life (TFL)

## 2015-04-13 ENCOUNTER — Telehealth: Payer: Self-pay | Admitting: *Deleted

## 2015-04-13 DIAGNOSIS — I5032 Chronic diastolic (congestive) heart failure: Secondary | ICD-10-CM

## 2015-04-13 DIAGNOSIS — C170 Malignant neoplasm of duodenum: Secondary | ICD-10-CM

## 2015-04-13 DIAGNOSIS — I2510 Atherosclerotic heart disease of native coronary artery without angina pectoris: Secondary | ICD-10-CM

## 2015-04-13 DIAGNOSIS — Z85820 Personal history of malignant melanoma of skin: Secondary | ICD-10-CM

## 2015-04-13 DIAGNOSIS — I81 Portal vein thrombosis: Secondary | ICD-10-CM

## 2015-04-13 DIAGNOSIS — R4182 Altered mental status, unspecified: Secondary | ICD-10-CM

## 2015-04-13 DIAGNOSIS — C679 Malignant neoplasm of bladder, unspecified: Secondary | ICD-10-CM

## 2015-04-13 DIAGNOSIS — R531 Weakness: Principal | ICD-10-CM

## 2015-04-13 DIAGNOSIS — Z9861 Coronary angioplasty status: Secondary | ICD-10-CM

## 2015-04-13 DIAGNOSIS — G893 Neoplasm related pain (acute) (chronic): Secondary | ICD-10-CM

## 2015-04-13 DIAGNOSIS — C786 Secondary malignant neoplasm of retroperitoneum and peritoneum: Secondary | ICD-10-CM

## 2015-04-13 DIAGNOSIS — D649 Anemia, unspecified: Secondary | ICD-10-CM

## 2015-04-13 DIAGNOSIS — G629 Polyneuropathy, unspecified: Secondary | ICD-10-CM

## 2015-04-13 LAB — CBC WITH DIFFERENTIAL/PLATELET
BASOS ABS: 0 10*3/uL (ref 0.0–0.1)
Basophils Relative: 0 % (ref 0–1)
EOS PCT: 0 % (ref 0–5)
Eosinophils Absolute: 0 10*3/uL (ref 0.0–0.7)
HCT: 27.5 % — ABNORMAL LOW (ref 39.0–52.0)
Hemoglobin: 8.6 g/dL — ABNORMAL LOW (ref 13.0–17.0)
LYMPHS ABS: 1.7 10*3/uL (ref 0.7–4.0)
Lymphocytes Relative: 10 % — ABNORMAL LOW (ref 12–46)
MCH: 28.2 pg (ref 26.0–34.0)
MCHC: 31.3 g/dL (ref 30.0–36.0)
MCV: 90.2 fL (ref 78.0–100.0)
MONO ABS: 0.5 10*3/uL (ref 0.1–1.0)
Monocytes Relative: 3 % (ref 3–12)
Neutro Abs: 14.4 10*3/uL — ABNORMAL HIGH (ref 1.7–7.7)
Neutrophils Relative %: 87 % — ABNORMAL HIGH (ref 43–77)
PLATELETS: 438 10*3/uL — AB (ref 150–400)
RBC: 3.05 MIL/uL — AB (ref 4.22–5.81)
RDW: 19.5 % — ABNORMAL HIGH (ref 11.5–15.5)
WBC: 16.6 10*3/uL — ABNORMAL HIGH (ref 4.0–10.5)

## 2015-04-13 LAB — COMPREHENSIVE METABOLIC PANEL
ALBUMIN: 3.1 g/dL — AB (ref 3.5–5.2)
ALT: 93 U/L — AB (ref 0–53)
ANION GAP: 11 (ref 5–15)
AST: 49 U/L — ABNORMAL HIGH (ref 0–37)
Alkaline Phosphatase: 357 U/L — ABNORMAL HIGH (ref 39–117)
BILIRUBIN TOTAL: 1.1 mg/dL (ref 0.3–1.2)
BUN: 63 mg/dL — AB (ref 6–23)
CHLORIDE: 105 mmol/L (ref 96–112)
CO2: 21 mmol/L (ref 19–32)
Calcium: 9 mg/dL (ref 8.4–10.5)
Creatinine, Ser: 1.79 mg/dL — ABNORMAL HIGH (ref 0.50–1.35)
GFR calc Af Amer: 38 mL/min — ABNORMAL LOW (ref >90)
GFR calc non Af Amer: 33 mL/min — ABNORMAL LOW (ref >90)
Glucose, Bld: 151 mg/dL — ABNORMAL HIGH (ref 70–99)
POTASSIUM: 4.7 mmol/L (ref 3.5–5.1)
SODIUM: 137 mmol/L (ref 135–145)
TOTAL PROTEIN: 6.3 g/dL (ref 6.0–8.3)

## 2015-04-13 LAB — AMMONIA: AMMONIA: 127 umol/L — AB (ref 11–32)

## 2015-04-13 LAB — GLUCOSE, CAPILLARY: GLUCOSE-CAPILLARY: 162 mg/dL — AB (ref 70–99)

## 2015-04-13 LAB — CK: Total CK: 23 U/L (ref 7–232)

## 2015-04-13 LAB — HEMOGLOBIN AND HEMATOCRIT, BLOOD
HCT: 26.5 % — ABNORMAL LOW (ref 39.0–52.0)
HEMOGLOBIN: 8.2 g/dL — AB (ref 13.0–17.0)

## 2015-04-13 LAB — MRSA PCR SCREENING: MRSA by PCR: NEGATIVE

## 2015-04-13 LAB — LACTIC ACID, PLASMA: LACTIC ACID, VENOUS: 1.9 mmol/L (ref 0.5–2.0)

## 2015-04-13 LAB — PREPARE RBC (CROSSMATCH)

## 2015-04-13 LAB — TROPONIN I: Troponin I: 0.04 ng/mL — ABNORMAL HIGH (ref <0.031)

## 2015-04-13 MED ORDER — HEPARIN SOD (PORK) LOCK FLUSH 100 UNIT/ML IV SOLN
500.0000 [IU] | Freq: Every day | INTRAVENOUS | Status: DC | PRN
  Filled 2015-04-13: qty 5

## 2015-04-13 MED ORDER — SODIUM CHLORIDE 0.9 % IV SOLN
8.0000 mg | Freq: Four times a day (QID) | INTRAVENOUS | Status: DC | PRN
  Administered 2015-04-14: 8 mg via INTRAVENOUS
  Filled 2015-04-13 (×3): qty 4

## 2015-04-13 MED ORDER — SODIUM CHLORIDE 0.9 % IV SOLN
Freq: Once | INTRAVENOUS | Status: AC
  Administered 2015-04-13: 22:00:00 via INTRAVENOUS

## 2015-04-13 MED ORDER — SODIUM CHLORIDE 0.9 % IJ SOLN
3.0000 mL | INTRAMUSCULAR | Status: DC | PRN
Start: 2015-04-13 — End: 2015-04-16

## 2015-04-13 MED ORDER — SODIUM CHLORIDE 0.9 % IJ SOLN: 10.0000 mL | INTRAMUSCULAR | Status: DC | PRN

## 2015-04-13 MED ORDER — OCTREOTIDE LOAD VIA INFUSION
50.0000 ug | Freq: Once | INTRAVENOUS | Status: AC
  Administered 2015-04-13: 50 ug via INTRAVENOUS
  Filled 2015-04-13: qty 25

## 2015-04-13 MED ORDER — SODIUM CHLORIDE 0.9 % IV SOLN
INTRAVENOUS | Status: DC
Start: 2015-04-13 — End: 2015-04-13

## 2015-04-13 MED ORDER — ACETAMINOPHEN 325 MG PO TABS: 650.0000 mg | ORAL_TABLET | Freq: Once | ORAL | Status: DC

## 2015-04-13 MED ORDER — FUROSEMIDE 40 MG PO TABS
40.0000 mg | ORAL_TABLET | Freq: Once | ORAL | Status: AC
  Administered 2015-04-13: 40 mg via ORAL
  Filled 2015-04-13: qty 1

## 2015-04-13 MED ORDER — LACTULOSE 10 GM/15ML PO SOLN
10.0000 g | Freq: Two times a day (BID) | ORAL | Status: DC
  Administered 2015-04-13 – 2015-04-16 (×5): 10 g via ORAL
  Filled 2015-04-13 (×4): qty 15
  Filled 2015-04-13: qty 30
  Filled 2015-04-13: qty 15

## 2015-04-13 MED ORDER — DIPHENHYDRAMINE HCL 25 MG PO CAPS: 25.0000 mg | ORAL_CAPSULE | Freq: Once | ORAL | Status: DC

## 2015-04-13 MED ORDER — PANTOPRAZOLE SODIUM 40 MG IV SOLR: 40.0000 mg | Freq: Two times a day (BID) | INTRAVENOUS | Status: DC

## 2015-04-13 MED ORDER — SODIUM CHLORIDE 0.9 % IV SOLN
80.0000 mg | Freq: Once | INTRAVENOUS | Status: AC
  Administered 2015-04-13: 80 mg via INTRAVENOUS
  Filled 2015-04-13: qty 80

## 2015-04-13 MED ORDER — SODIUM CHLORIDE 0.9 % IV SOLN
50.0000 ug/h | INTRAVENOUS | Status: DC
  Administered 2015-04-13 – 2015-04-14 (×2): 50 ug/h via INTRAVENOUS
  Filled 2015-04-13 (×9): qty 1

## 2015-04-13 MED ORDER — SODIUM CHLORIDE 0.9 % IV SOLN
250.0000 mL | Freq: Once | INTRAVENOUS | Status: AC
  Administered 2015-04-14: 250 mL via INTRAVENOUS

## 2015-04-13 MED ORDER — HEPARIN SOD (PORK) LOCK FLUSH 100 UNIT/ML IV SOLN
250.0000 [IU] | INTRAVENOUS | Status: DC | PRN
  Filled 2015-04-13: qty 3

## 2015-04-13 MED ORDER — PROMETHAZINE HCL 25 MG/ML IJ SOLN
6.2500 mg | INTRAMUSCULAR | Status: AC | PRN
Start: 2015-04-13 — End: 2015-04-14
  Administered 2015-04-13 – 2015-04-14 (×2): 12.5 mg via INTRAVENOUS
  Filled 2015-04-13 (×2): qty 1

## 2015-04-13 MED ORDER — FUROSEMIDE 10 MG/ML IJ SOLN
20.0000 mg | Freq: Once | INTRAMUSCULAR | Status: AC
  Administered 2015-04-14: 20 mg via INTRAVENOUS
  Filled 2015-04-13: qty 2

## 2015-04-13 MED ORDER — SODIUM CHLORIDE 0.9 % IV SOLN
8.0000 mg/h | INTRAVENOUS | Status: DC
  Administered 2015-04-13 – 2015-04-15 (×5): 8 mg/h via INTRAVENOUS
  Filled 2015-04-13 (×13): qty 80

## 2015-04-13 MED ORDER — ONDANSETRON HCL 8 MG PO TABS: 8.0000 mg | ORAL_TABLET | Freq: Four times a day (QID) | ORAL | Status: DC | PRN

## 2015-04-13 MED ORDER — DEXTROSE-NACL 5-0.9 % IV SOLN
INTRAVENOUS | Status: DC
  Administered 2015-04-13 – 2015-04-15 (×4): via INTRAVENOUS

## 2015-04-13 MED ORDER — FENTANYL CITRATE (PF) 100 MCG/2ML IJ SOLN: 25.0000 ug | INTRAMUSCULAR | Status: DC | PRN

## 2015-04-13 NOTE — Progress Notes (Signed)
Subjective: 81 y.o. 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 Patient was brought to the ER later and CT head did not show anything acute. Ammonia level is minimally elevated. Patient's hemoglobin shows decline from his base of ear. Per the family patient did not have any nausea vomiting abdominal pain diarrhea or chest pain or shortness of breath. As per the family patient's mentation has come back to baseline. On exam patient looks weak. Patient follows commands. Patient will be admitted for further management of patient generalized weakness and altered mental status which has improved at this time  This morning he has already received one unit PRBC. Filed Vitals:   04/13/15 1400  BP: 138/56  Pulse: 67  Temp: 97.6 F (36.4 C)  Resp: 16    Chest: Clear Bilaterally Heart : S1S2 RRR Abdomen: Soft, nontender Ext : No edema Neuro: Alert, oriented x 3  A/P Generalized weakness Acute kidney injury Leukocytosis Encephalopathy Anemia Upper GI bleed  Patient received 1 unit of PRBC this morning, hemoglobin at 5:30 AM is 8.6. We will continue with IV fluids D5 normal saline at 75 MR per hour. Will check CBC and a BMP in a.m. Will hold the diuretics at this time due to the renal insufficiency.  Patient had 3 episodes of coffee-ground emesis this morning. He has history of chronic GI bleed due to duodenal cancer. I called and discussed with Dr. Learta Codding, and he recommends to continue monitoring patient's H&H. And transfuse as needed.  [facility] [phone]

## 2015-04-13 NOTE — Progress Notes (Signed)
Pt has one unit of PRBC transfusing and has been tolerating it well, sleeping. At 0220 pt sat up on side of bed and vomited about 200cc of coffee ground emesis. His daughter stated his emesis looked like this when he last vomited 2 days ago as well. Gave pt zofran 4mg  IV. Pt unable to verbalize his complaints to me, which has been his baseline since admission (mostly yes or no answers). Pt is also belching excessively. Dtr states he has been doing this, but not quite this bad. Pt has laid back down and appears to be resting now. Continue to monitor. Hortencia Conradi RN

## 2015-04-13 NOTE — Progress Notes (Signed)
IP PROGRESS NOTE  Subjective:   James [name] begin a first cycle of FOLFOX on 04/11/2015. He presented to the emergency room last night with confusion. His daughter reports he became confused yesterday morning. No nausea/vomiting, bleeding, or other new symptoms. He has been intermittently more alert since the EMS team arrived at his house.  Objective: Vital signs in last 24 hours: Blood pressure 133/51, pulse 63, temperature 97.6 F (36.4 C), temperature source Oral, resp. rate 18, height 5\' 11"  (1.803 m), weight 205 lb 11.2 oz (93.305 kg), SpO2 100 %.  Intake/Output from previous day: 04/20 0701 - 04/21 0700 In: 365 [Blood:365] Out: -   Physical Exam:  HEENT: No thrush Lungs: Clear anteriorly Cardiac: Regular rate and rhythm Abdomen: Soft and nontender Extremities: No leg edema Neurologic: Alert, follows simple commands, he is verbal, but not able to answer questions. He moves all extremities. Mild tremor in the hands  Portacath/PICC-without erythema  Lab Results:  Recent Labs  04/12/15 1816 04/13/15 0530  WBC 19.7* 16.6*  HGB 7.7* 8.6*  HCT 25.1* 27.5*  PLT 475* 438*    BMET  Recent Labs  04/12/15 1816 04/13/15 0530  NA 135 137  K 4.6 4.7  CL 101 105  CO2 22 21  GLUCOSE 146* 151*  BUN 55* 63*  CREATININE 1.84* 1.79*  CALCIUM 8.9 9.0    Studies/Results: Ct Head Wo Contrast  04/12/2015   CLINICAL DATA:  81 year old male with altered mental status. Medical history includes gastric cancer metastatic to the liver and ongoing chemotherapy.  EXAM: CT HEAD WITHOUT CONTRAST  TECHNIQUE: Contiguous axial images were obtained from the base of the skull through the vertex without intravenous contrast.  COMPARISON:  Prior PET-CT 03/29/2015; prior brain MRI 01/06/2013; prior head CT 01/16/2011  FINDINGS: Negative for acute intracranial hemorrhage, acute infarction, mass, mass effect, hydrocephalus or midline shift. Gray-white differentiation is preserved throughout. Cerebral  and cerebellar cortical volume loss. Mild chronic microvascular ischemic white matter disease. No focal soft tissue or calvarial abnormality. Symmetric globes bilaterally. Normal aeration of the mastoid air cells paranasal sinuses. Mild hyperostosis frontalis interna. Intracranial atherosclerosis.  IMPRESSION: No acute intracranial abnormality.  Stable atrophy and mild chronic microvascular ischemic white matter disease.   Electronically Signed   By: Jacqulynn Cadet M.D.   On: 04/12/2015 20:11   Dg Chest Port 1 View  04/12/2015   CLINICAL DATA:  81 year old male with 1 day history of weakness and lethargy. Current clinical history includes gastric cancer metastatic to the liver. Patient received chemotherapy yesterday.  EXAM: PORTABLE CHEST - 1 VIEW  COMPARISON:  Prior chest x-ray 04/05/2015  FINDINGS: Single-lumen power injectable port catheter via a right internal jugular venous approach. Catheter tip projects over the mid SVC. Mild cardiomegaly. Atherosclerotic calcifications present in the aorta. No focal airspace consolidation, pleural effusion or pneumothorax. No evidence of pulmonary edema. Inspiratory volumes are slightly low with mild bibasilar atelectasis. No acute osseous abnormality.  IMPRESSION: Low inspiratory volumes with minimal bibasilar atelectasis.  No acute cardiopulmonary process.   Electronically Signed   By: Jacqulynn Cadet M.D.   On: 04/12/2015 18:31    Medications: I have reviewed the patient's current medications.  Assessment/Plan:  1.Duodenal mass, abdominal/retroperitoneal lymphadenopathy, and liver metastases noted on a CT 03/01/2015  Upper endoscopy 03/03/2015 confirmed a duodenal mass, status post a biopsy with the final pathology confirming a carcinoma, immunohistochemical stains negative for lymphoma, neuroendocrine carcinoma, and melanoma markers. Positive staining for CDX2, cytokeratin AE1/AE3, and CD10  PET scan 03/29/2015  showed a large intensely hypermetabolic  mass circumferentially narrowing the second portion of the duodenum. Adjacent hypermetabolic lymph nodes medial to the duodenum mass. Irregular hypermetabolic nodal mass in the inferior aspect of the gastrohepatic ligament. Hypermetabolic nodal metastasis in the retroperitoneum left of the aorta. No abnormal metabolic activity within the enlarged inferior right lobe of the liver.  Cycle 1 FOLFOX 04/11/2015  2. Gastric outlet obstruction secondary to #1, status post a palliative gastrojejunostomy 03/09/2015  3. 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 Portal vein thrombosis diagnosed in September 2015  5. Left leg DVT December 2014  6. History of coronary artery disease, status post an LAD drug-eluting stent July 2015  7. Remote history of melanoma of the right ear  8. 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 History of drug related ITP  10. Abdominal pain secondary to the duodenal mass/liver metastases/adenopathy  11. Neuropathy  12. Altered mental status 04/12/2015  James [name] is admitted with an altered mental status. I doubt this is related to chemotherapy. The altered mental status is most likely secondary to delirium in the setting of hepatic/renal insufficiency. No clinical evidence of an infection, but his white count is elevated.  Recommendations: 1. Discontinue 5-FU infusion 2. Follow-up blood cultures 3. Trial of lactulose    LOS: 1 day   Bertil Brickey  04/13/2015, 8:25 AM

## 2015-04-13 NOTE — Telephone Encounter (Signed)
Notified [facility] of pt's admission. Transfusion appt canceled.

## 2015-04-13 NOTE — Progress Notes (Signed)
Got a call from the nurse that patient is vomiting bright red blood. Called GI on call Dr Ardis Hughs from [facility]. EGD from march 2016 showed esophageal varices,Recommend starting IV protonix, IV octreotide bolus and infusion. Will transfuse two units PRBC. Will transfer to step down unit.

## 2015-04-13 NOTE — H&P (Addendum)
[facility] History and Physical  James [name] PNT:[id number] DOB: [birth year] [name]: 04/12/2015  Referring physician: Dr. Ardith Dark. ER physician. PCP: [name], MD  Specialists: Dr. Learta Codding. Oncologist.  Chief Complaint: Weakness and confusion.  HPI: James [name] is a 81 y.o. 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 Patient was brought to the ER later and CT head did not show anything acute. Ammonia level is minimally elevated. Patient's hemoglobin shows decline from his base of ear. Per the family patient did not have any nausea vomiting abdominal pain diarrhea or chest pain or shortness of breath. As per the family patient's mentation has come back to baseline. On exam patient looks weak. Patient follows commands. Patient will be admitted for further management of patient generalized weakness and altered mental status which has improved at this time.   Review of Systems: As presented in the history of presenting illness, rest negative.  Past Medical History  Diagnosis Date  . Bradycardia     Hypertensive  . carotid bruit, right   . hypercholesterolemia     Trig 234/293;takes Simcor daily  . Obesity, morbid   . Vasculitis   . 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 Beryle Beams; Dr. Beryle Beams , normal platelets  . Dermatitis     legs  . Bleeding gums   . Chronic low back pain   . Myalgia and myositis   . Unspecified vitamin D deficiency   . Bladder diverticulum   . Primary osteoarthritis of both knees   . Nephrolithiasis   . History of elevated glucose   . Thrombophlebitis     right forearm  . History of BPH   . History of colonic polyps     [name]  . Epididymitis, left     S/P Epidimectomy  . Splenomegaly 05/15/09    abd U/S NML  . Hypertension,  benign     takes Micardis daily  . Neuropathy     acute  . Bruises easily   . H/O hiatal hernia   . GERD (gastroesophageal reflux disease)     hx of but doesn't take any meds now  . Urinary frequency   . Urinary urgency   . Nocturia   . Urinary leakage   . Abnormality of gait 03/19/2013  . DVT of lower limb, acute 11/19/2013    Peroneal & distal tibial left 11/01/13  . Polio 1941    "mild case"  . CHF (congestive heart failure)     "low grade" (06/22/2014)  . DVT (deep venous thrombosis) 10/2013    LLE  . Pneumonia, bacterial     RML resolved, spirometry, normal  . Pneumonia 1933  . History of blood transfusion 05/2014  . Anemia 05/2014    "related to Xarelto"  . Iron deficiency anemia 05/2014    "got iron infusion"  . Arthritis     "all over"  . Gout   . Malignant melanoma of ear     right  . Thrombophlebitis of superficial veins of right lower extremity 06/23/2014    Incidental finding doppler 05/30/14  . Shortness of breath   . Esophageal varices without mention of bleeding 08/08/2014  . Portal vein thrombosis   . Duodenal cancer    Past Surgical History  Procedure Laterality Date  . Cystoscopy w/ stone manipulation  1950's  . Fem  cutaneous nerve entrapment  1977    due to surgery (mild lat anesth of bilat legs)  . Limited pelvis/hip bone scan  04/99    negative  . Bone scan  04/00  . Cataract extraction w/ intraocular lens  implant, bilateral Bilateral ~ 2004  . Melanoma excision Right 06/04    ear; Wide local excision,,with flap  . Stress cardiolite  05/11/04    mild inf. ischemia, EF 71%  . Shoulder surgery Left 06/17/2005    "tore it all to pieces when I fell; not fractured"  . Bronchoscopy  09/08    RML collapse with chronic pneumonia, bx neg (Dr. Joya Gaskins)  . Cyst excision Right 03/07/09    middle finger, DIP Joint Mucoid (Dr. Fredna Dow)  . Eyelid & eyebrow lift  1996  . Cyst excision Right 08/15/09    middle finger, DIP Joint Mucoid Cyst (Dr. Fredna Dow)  .  Appendectomy  1950  . Excision of mucoid tumor    . Colonoscopy    . Total knee arthroplasty  01/10/2012    Procedure: TOTAL KNEE ARTHROPLASTY;  Surgeon: Augustin Schooling, MD;  Location: [place];  Service: Orthopedics;  Laterality: Left;  . Surgery scrotal / testicular Left ~ 2000    removal of mass, noncancerous  . Cardiac catheterization  05/23/04    mild plague-statin  . Coronary angioplasty with stent placement  06/22/2014    to proximal LAD  . Inguinal hernia repair Bilateral 1959  . Inguinal hernia repair Left 1970's  . Esophagogastroduodenoscopy N/A 08/08/2014    Procedure: ESOPHAGOGASTRODUODENOSCOPY (EGD);  Surgeon: Gatha Mayer, MD;  Location: [facility] ENDOSCOPY;  Service: Endoscopy;  Laterality: N/A;  . Colonoscopy N/A 08/08/2014    Procedure: COLONOSCOPY;  Surgeon: Gatha Mayer, MD;  Location: [place];  Service: Endoscopy;  Laterality: N/A;  . Flexible sigmoidoscopy N/A 09/21/2014    Procedure: FLEXIBLE SIGMOIDOSCOPY;  Surgeon: Jerene Bears, MD;  Location: [place];  Service: Gastroenterology;  Laterality: N/A;  . Givens capsule study N/A 09/21/2014    Procedure: GIVENS CAPSULE STUDY;  Surgeon: Jerene Bears, MD;  Location: [facility] ENDOSCOPY;  Service: Endoscopy;  Laterality: N/A;  . Left and right heart catheterization with coronary angiogram N/A 06/22/2014    Procedure: LEFT AND RIGHT HEART CATHETERIZATION WITH CORONARY ANGIOGRAM;  Surgeon: Larey Dresser, MD;  Location: [facility] CATH LAB;  Service: Cardiovascular;  Laterality: N/A;  . Percutaneous coronary stent intervention (pci-s)  06/22/2014    Procedure: PERCUTANEOUS CORONARY STENT INTERVENTION (PCI-S);  Surgeon: Larey Dresser, MD;  Location: [facility] CATH LAB;  Service: Cardiovascular;;  . Colonoscopy N/A 01/25/2015    Procedure: COLONOSCOPY;  Surgeon: Gatha Mayer, MD;  Location: [facility] ENDOSCOPY;  Service: Endoscopy;  Laterality: N/A;  . Cystoscopy with biopsy Right 02/03/2015    Procedure: CYSTOSCOPY WITH COLD CUP BLADDER BIOPSY CAUTHERIZAITON  OF RIGHT BLADDER CANCER AND SATILITE, TURBT RIGHT BLADDER BASE;  Surgeon: Ailene Rud, MD;  Location: [facility] ORS;  Service: Urology;  Laterality: Right;  . Esophagogastroduodenoscopy N/A 03/03/2015    Procedure: ESOPHAGOGASTRODUODENOSCOPY (EGD);  Surgeon: Jerene Bears, MD;  Location: [facility] ENDOSCOPY;  Service: Gastroenterology;  Laterality: N/A;  . Gastrojejunostomy N/A 03/09/2015    Procedure: LAPAROSCOPIC GASTROJEJUNOSTOMY;  Surgeon: Excell Seltzer, MD;  Location: [facility] ORS;  Service: General;  Laterality: N/A;  . Portacath placement Right 04/05/2015    Procedure: INSERTION PORT-A-CATH;  Surgeon: Excell Seltzer, MD;  Location: [place];  Service: General;  Laterality: Right;   Social History:  reports that he quit smoking about 47 years ago. His smoking use included Cigarettes and Cigars. He has a 40 pack-year smoking history. He has never used smokeless tobacco. He reports that he does not drink alcohol or use illicit drugs. Where does patient live at home. Can patient participate in ADLs? Yes.  Allergies  Allergen Reactions  . Sulfa Antibiotics Other (See Comments)    Caused patient have ITP  . Aleve [Naproxen Sodium] Other (See Comments)    Causes platlet drop  . Rofecoxib Other (See Comments)    Celebrex  - affected low platelets  . Sulfamethoxazole-Trimethoprim Other (See Comments)     Causes low platelets and bleeding  . Tape Other (See Comments)    Adhesive tape makes skin tear.  . Tramadol Hcl Other (See Comments)    dizziness, drowsiness  . Neomycin-Bacitracin Zn-Polymyx Rash  . Neosporin [Neomycin-Polymyxin-Gramicidin] Rash  . Penicillins Swelling and Rash    Took dose of amoxicillin 2000mg  02/02/15 at dentist office without any complications    Family History:  Family History  Problem Relation Age of Onset  . Thyroid cancer Mother   . Diabetes Father   . Heart disease Father   . Stroke Brother     cerebral hemm  . Colon cancer Brother   .  Anesthesia problems Neg Hx   . Hypotension Neg Hx   . Malignant hyperthermia Neg Hx   . Pseudochol deficiency Neg Hx   . Prostate cancer Neg Hx   . Heart attack Father       Prior to Admission medications   Medication Sig Start Date End Date Taking? Authorizing Provider  aspirin EC 81 MG tablet Take 81 mg by mouth every evening.    Yes Historical Provider, MD  Cholecalciferol (VITAMIN D3) 2000 UNITS capsule Take 2,000 Units by mouth every morning.    Yes Historical Provider, MD  feeding supplement, ENSURE ENLIVE, (ENSURE ENLIVE) LIQD Take 237 mLs by mouth 3 (three) times daily between meals. 03/16/15  Yes Debbe Odea, MD  finasteride (PROSCAR) 5 MG tablet Take 5 mg by mouth daily.   Yes Historical Provider, MD  furosemide (LASIX) 40 MG tablet Take 1 tablet (40 mg total) by mouth daily. 02/28/15  Yes Larey Dresser, MD  iron polysaccharides (NIFEREX) 150 MG capsule Take 150 mg by mouth 2 (two) times daily.   Yes Historical Provider, MD  lidocaine-prilocaine (EMLA) cream Apply 1 application topically as needed. 04/06/15  Yes Owens Shark, NP  LIVALO 4 MG TABS TAKE 1 TABLET AT BEDTIME 10/03/14  Yes Jolaine Artist, MD  Multiple Vitamin (MULTIVITAMIN WITH MINERALS) TABS tablet Take 1 tablet by mouth daily.   Yes Historical Provider, MD  OVER THE COUNTER MEDICATION Place 2 drops into both eyes 4 (four) times daily. Wash type eye drop-   Yes Historical Provider, MD  oxyCODONE-acetaminophen (ROXICET) 5-325 MG per tablet Take 1 tablet by mouth every 4 (four) hours as needed for severe pain. 03/21/15  Yes Ladell Pier, MD  pantoprazole (PROTONIX) 40 MG tablet Take 40 mg by mouth daily.    Yes Historical Provider, MD  predniSONE (DELTASONE) 5 MG tablet Take 1 tablet (5 mg total) by mouth daily with breakfast. 03/31/15  Yes Owens Shark, NP  Probiotic Product (PROBIOTIC & ACIDOPHILUS EX ST PO) Take 1 tablet by mouth every morning.    Yes Historical Provider, MD  prochlorperazine (COMPAZINE) 5 MG  tablet Take 1 tablet (5 mg total) by mouth every 6 (six)  hours as needed for nausea or vomiting. 03/21/15  Yes Ladell Pier, MD  Psyllium (METAMUCIL PO) Take 20 mLs by mouth every evening.   Yes Historical Provider, MD  ranitidine (ZANTAC) 150 MG tablet Take 150 mg by mouth at bedtime.   Yes Historical Provider, MD  spironolactone (ALDACTONE) 25 MG tablet Take 1 tablet (25 mg total) by mouth daily. 10/26/14  Yes Larey Dresser, MD  allopurinol (ZYLOPRIM) 300 MG tablet Take 0.5 tablets (150 mg total) by mouth every morning. 10/31/14   Tonia Ghent, MD  allopurinol (ZYLOPRIM) 300 MG tablet TAKE 1 TABLET DAILY 04/03/15   Tonia Ghent, MD  nitroGLYCERIN (NITROSTAT) 0.4 MG SL tablet Place 1 tablet (0.4 mg total) under the tongue every 5 (five) minutes as needed. For chest pain Patient not taking: Reported on 03/31/2015 07/07/14   Larey Dresser, MD    Physical Exam: Filed Vitals:   04/12/15 2152 04/13/15 0051 04/13/15 0114 04/13/15 0343  BP: 123/48 99/55 132/45 133/51  Pulse: 65 61 56 63  Temp: 98.3 F (36.8 C) 97.5 F (36.4 C) 98.3 F (36.8 C) 97.6 F (36.4 C)  TempSrc: Oral Oral Oral Oral  Resp: 16 18 18 18   Height: 5\' 11"  (1.803 m)     Weight: 93.305 kg (205 lb 11.2 oz)     SpO2: 100% 99% 99% 100%     General:  Well built and nourished.  Eyes: Anicteric no pallor.  ENT: No discharge from the ears eyes nose and mouth.  Neck: No mass felt.  Cardiovascular: S1 and S2 heard.  Respiratory: No rhonchi or crepitations.  Abdomen: Soft nontender bowel sounds present.  Skin: No rash.  Musculoskeletal: No edema.  Psychiatric: Appears normal.  Neurologic: Patient is sleepy but answers questions appropriately. Follows commands and moves all extremities.  Labs on Admission:  Basic Metabolic Panel:  Recent Labs Lab 04/11/15 1249 04/12/15 1816  NA 135* 135  K 4.2 4.6  CL  --  101  CO2 17* 22  GLUCOSE 210* 146*  BUN 37.3* 55*  CREATININE 1.5* 1.84*  CALCIUM 9.0 8.9    Liver Function Tests:  Recent Labs Lab 04/11/15 1249 04/12/15 1816  AST 40* 53*  ALT 76* 78*  ALKPHOS 501* 382*  BILITOT 1.30* 1.3*  PROT 6.6 6.5  ALBUMIN 3.0* 3.0*   No results for input(s): LIPASE, AMYLASE in the last 168 hours.  Recent Labs Lab 04/12/15 1816  AMMONIA 50*   CBC:  Recent Labs Lab 04/11/15 1249 04/12/15 1816 04/13/15 0530  WBC 16.3* 19.7* 16.6*  NEUTROABS 14.1* 17.1* PENDING  HGB 8.3* 7.7* 8.6*  HCT 27.1* 25.1* 27.5*  MCV 91.6 90.6 90.2  PLT 514* 475* 438*   Cardiac Enzymes: No results for input(s): CKTOTAL, CKMB, CKMBINDEX, TROPONINI in the last 168 hours.  BNP (last 3 results) No results for input(s): BNP in the last 8760 hours.  ProBNP (last 3 results)  Recent Labs  06/06/14 1244 11/29/14 0944 03/22/15 1513  PROBNP 83.0 114.6 71.0    CBG: No results for input(s): GLUCAP in the last 168 hours.  Radiological Exams on Admission: Ct Head Wo Contrast  04/12/2015   CLINICAL DATA:  81 year old male with altered mental status. Medical history includes gastric cancer metastatic to the liver and ongoing chemotherapy.  EXAM: CT HEAD WITHOUT CONTRAST  TECHNIQUE: Contiguous axial images were obtained from the base of the skull through the vertex without intravenous contrast.  COMPARISON:  Prior PET-CT 03/29/2015; prior brain MRI 01/06/2013;  prior head CT 01/16/2011  FINDINGS: Negative for acute intracranial hemorrhage, acute infarction, mass, mass effect, hydrocephalus or midline shift. Gray-white differentiation is preserved throughout. Cerebral and cerebellar cortical volume loss. Mild chronic microvascular ischemic white matter disease. No focal soft tissue or calvarial abnormality. Symmetric globes bilaterally. Normal aeration of the mastoid air cells paranasal sinuses. Mild hyperostosis frontalis interna. Intracranial atherosclerosis.  IMPRESSION: No acute intracranial abnormality.  Stable atrophy and mild chronic microvascular ischemic white  matter disease.   Electronically Signed   By: Jacqulynn Cadet M.D.   On: 04/12/2015 20:11   Dg Chest Port 1 View  04/12/2015   CLINICAL DATA:  81 year old male with 1 day history of weakness and lethargy. Current clinical history includes gastric cancer metastatic to the liver. Patient received chemotherapy yesterday.  EXAM: PORTABLE CHEST - 1 VIEW  COMPARISON:  Prior chest x-ray 04/05/2015  FINDINGS: Single-lumen power injectable port catheter via a right internal jugular venous approach. Catheter tip projects over the mid SVC. Mild cardiomegaly. Atherosclerotic calcifications present in the aorta. No focal airspace consolidation, pleural effusion or pneumothorax. No evidence of pulmonary edema. Inspiratory volumes are slightly low with mild bibasilar atelectasis. No acute osseous abnormality.  IMPRESSION: Low inspiratory volumes with minimal bibasilar atelectasis.  No acute cardiopulmonary process.   Electronically Signed   By: Jacqulynn Cadet M.D.   On: 04/12/2015 18:31     Assessment/Plan Principal Problem:   Weakness Active Problems:   HYPERTENSION, BENIGN   CAD S/P LAD DES after FFR 06/22/14   CHF (congestive heart failure)   Hypothyroidism   Altered mental status   Normocytic anemia   1. Generalized weakness - in a patient receiving chemotherapy from yesterday. May be from deconditioning and also from patient receiving chemotherapy and also from poor oral intake. Patient's anemia also may be a major contribution to patient's generalized weakness. At this time I have ordered 1 unit of PRBC transfusion and patient may need more given the patient history of CAD. With the given history of CHF at this time we'll closely monitor for respiratory status after each unit. Physical therapy consult. Check troponins and CK levels and TSH. 2. Acute encephalopathy - essentially has improved and on exam patient is well-oriented and follows commands. Patient's ammonia level was mildly elevated the ER and  was given 1 dose of lactulose. Repeat ammonia levels in a.m. CT head was unremarkable. Closely observe. 3. Acute renal failure - probably from poor intake and dehydration. Patient's UA is unremarkable. Closely follow metabolic panel after hydration. 4. Leucocytosis - afebrile. No defenite source of infection. Check blood cultures. Follow cbc. 5. Hypothyroidism - continue Synthroid check TSH. 6. CAD status post stenting - patient is on aspirin and statins. Check troponin and CK levels. 7. Chronic diastolic CHF - last EF measured in 2014 was 60-65% - patient is receiving blood transfusion. Closely follow respiratory status. Patient is on Lasix and spironolactone which will be continued at this time. Patient's diuretics may need to be held if patient's creatinine does not improve. 8. Normocytic anemia - see #1. 9. Duodenal cancer status post recent surgery on chemotherapy - per oncologist. Dr. Learta Codding listed.   DVT Prophylaxis Lovenox.  Code Status: Full code.  Family Communication: Patient's daughter at the bedside.  Disposition Plan: Admit to inpatient. Likely stay 2-3 days.   Wilmon Conover N. [facility] Pager [phone].  If 7PM-7AM, please contact night-coverage [email or web address] Password [facility] 04/13/2015, 5:48 AM

## 2015-04-13 NOTE — Progress Notes (Signed)
INITIAL NUTRITION ASSESSMENT  DOCUMENTATION CODES Per approved criteria  -Severe malnutrition in the context of chronic illness  Pt meets criteria for severe MALNUTRITION in the context of chronic illness as evidenced by 15% weight loss x 2.5 months and energy intake <75% for >/=1 month.    INTERVENTION: Continue Ensure Enlive po TID, each supplement provides 350 kcal and 20 grams of protein Encourage PO intake RD to continue to monitor  NUTRITION DIAGNOSIS: Malnutrition related to chronic illness as evidenced by 15% weight loss x 2.5 months and energy intake <75% for >/=1 month.   Goal: Pt to meet >/= 90% of their estimated nutrition needs   Monitor:  PO and supplemental intake, weight, labs, I/O's  Reason for Assessment: Pt identified as at nutrition risk on the Malnutrition Screen Tool  Admitting Dx: Weakness  ASSESSMENT: 81 y.o. 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   Pt followed by [place] RD, last seen 4/19.  Pt in room with daughter. Per pt's daughter, pt has been eating "minimally" the last month since his gastric surgery. Pt has been able to tolerate Ensure Enlive drinks which have been ordered TID. Pt requests room temperature and chocolate flavor only.  Pt's daughter asked pt if felt like eating anything and he shook his head no. Pt was heaving during RD visit. Pt has been vomiting per daughter.   Pt has lost 35 lb since 2/03 (15% weight loss x 2.5 months, significant for time frame).  RD provided room temperature Ensure to pt and encouraged trying a sip today to see if he can tolerate the supplement.   Pt with mild-moderate depletion present in arm regions. Pt with non-pitting edema in RLE & LLE.  Labs reviewed: Elevated BUN & Creatinine  Height: Ht Readings from Last 1 Encounters:  04/12/15 5\' 11"  (1.803 m)     Weight: Wt Readings from Last 1 Encounters:  04/12/15 205 lb 11.2 oz (93.305 kg)    Ideal Body Weight: 172 lb  % Ideal Body Weight: 119%  Wt Readings from Last 10 Encounters:  04/12/15 205 lb 11.2 oz (93.305 kg)  04/05/15 207 lb (93.895 kg)  03/22/15 223 lb 8 oz (101.379 kg)  03/21/15 221 lb 14.4 oz (100.653 kg)  03/16/15 221 lb 5.5 oz (100.4 kg)  03/01/15 229 lb (103.874 kg)  02/20/15 235 lb 14.4 oz (107.004 kg)  02/16/15 227 lb 9.6 oz (103.239 kg)  02/03/15 234 lb 6 oz (106.312 kg)  01/25/15 240 lb (108.863 kg)    Usual Body Weight: 250 lb  % Usual Body Weight: 82%  BMI:  Body mass index is 28.7 kg/(m^2).  Estimated Nutritional Needs: Kcal: 2000-2200 Protein: 110-120g Fluid: 2L/day  Skin: chest and neck incision  Diet Order: Diet Heart Room service appropriate?: Yes; Fluid consistency:: Thin  EDUCATION NEEDS: -No education needs identified at this time   Intake/Output Summary (Last 24 hours) at 04/13/15 1057 Last data filed at 04/13/15 1000  Gross per 24 hour  Intake    365 ml  Output    300 ml  Net     65 ml    Last BM: 4/20  Labs:   Recent Labs Lab 04/11/15 1249 04/12/15 1816 04/13/15 0530  NA 135* 135 137  K 4.2 4.6 4.7  CL  --  101 105  CO2 17* 22 21  BUN 37.3* 55* 63*  CREATININE 1.5* 1.84*  1.79*  CALCIUM 9.0 8.9 9.0  GLUCOSE 210* 146* 151*    CBG (last 3)  No results for input(s): GLUCAP in the last 72 hours.  Scheduled Meds: . aspirin EC  81 mg Oral QPM  . enoxaparin (LOVENOX) injection  40 mg Subcutaneous QHS  . famotidine  20 mg Oral BID  . feeding supplement (ENSURE ENLIVE)  237 mL Oral TID BM  . finasteride  5 mg Oral Daily  . furosemide  40 mg Oral Once  . iron polysaccharides  150 mg Oral BID  . lactulose  10 g Oral BID  . multivitamin with minerals  1 tablet Oral Daily  . pantoprazole  40 mg Oral Daily  . pravastatin  20 mg Oral q1800  . predniSONE  5 mg Oral Q breakfast  . psyllium  1 packet Oral Daily  .  sodium chloride  3 mL Intravenous Q12H    Continuous Infusions: . dextrose 5 % and 0.9% NaCl      Past Medical History  Diagnosis Date  . Bradycardia     Hypertensive  . carotid bruit, right   . hypercholesterolemia     Trig 234/293;takes Simcor daily  . Obesity, morbid   . Vasculitis   . 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 Beryle Beams; Dr. Beryle Beams , normal platelets  . Dermatitis     legs  . Bleeding gums   . Chronic low back pain   . Myalgia and myositis   . Unspecified vitamin D deficiency   . Bladder diverticulum   . Primary osteoarthritis of both knees   . Nephrolithiasis   . History of elevated glucose   . Thrombophlebitis     right forearm  . History of BPH   . History of colonic polyps     [name]  . Epididymitis, left     S/P Epidimectomy  . Splenomegaly 05/15/09    abd U/S NML  . Hypertension, benign     takes Micardis daily  . Neuropathy     acute  . Bruises easily   . H/O hiatal hernia   . GERD (gastroesophageal reflux disease)     hx of but doesn't take any meds now  . Urinary frequency   . Urinary urgency   . Nocturia   . Urinary leakage   . Abnormality of gait 03/19/2013  . DVT of lower limb, acute 11/19/2013    Peroneal & distal tibial left 11/01/13  . Polio 1941    "mild case"  . CHF (congestive heart failure)     "low grade" (06/22/2014)  . DVT (deep venous thrombosis) 10/2013    LLE  . Pneumonia, bacterial     RML resolved, spirometry, normal  . Pneumonia 1933  . History of blood transfusion 05/2014  . Anemia 05/2014    "related to Xarelto"  . Iron deficiency anemia 05/2014    "got iron infusion"  . Arthritis     "all over"  . Gout   . Malignant melanoma of ear     right  . Thrombophlebitis of superficial veins of right lower extremity 06/23/2014    Incidental finding doppler 05/30/14  . Shortness of breath   . Esophageal varices without mention of bleeding 08/08/2014  . Portal vein  thrombosis   . Duodenal cancer     Past Surgical History  Procedure Laterality Date  . Cystoscopy w/ stone manipulation  1950's  . Fem cutaneous nerve entrapment  1977  due to surgery (mild lat anesth of bilat legs)  . Limited pelvis/hip bone scan  04/99    negative  . Bone scan  04/00  . Cataract extraction w/ intraocular lens  implant, bilateral Bilateral ~ 2004  . Melanoma excision Right 06/04    ear; Wide local excision,,with flap  . Stress cardiolite  05/11/04    mild inf. ischemia, EF 71%  . Shoulder surgery Left 06/17/2005    "tore it all to pieces when I fell; not fractured"  . Bronchoscopy  09/08    RML collapse with chronic pneumonia, bx neg (Dr. Joya Gaskins)  . Cyst excision Right 03/07/09    middle finger, DIP Joint Mucoid (Dr. Fredna Dow)  . Eyelid & eyebrow lift  1996  . Cyst excision Right 08/15/09    middle finger, DIP Joint Mucoid Cyst (Dr. Fredna Dow)  . Appendectomy  1950  . Excision of mucoid tumor    . Colonoscopy    . Total knee arthroplasty  01/10/2012    Procedure: TOTAL KNEE ARTHROPLASTY;  Surgeon: Augustin Schooling, MD;  Location: [place];  Service: Orthopedics;  Laterality: Left;  . Surgery scrotal / testicular Left ~ 2000    removal of mass, noncancerous  . Cardiac catheterization  05/23/04    mild plague-statin  . Coronary angioplasty with stent placement  06/22/2014    to proximal LAD  . Inguinal hernia repair Bilateral 1959  . Inguinal hernia repair Left 1970's  . Esophagogastroduodenoscopy N/A 08/08/2014    Procedure: ESOPHAGOGASTRODUODENOSCOPY (EGD);  Surgeon: Gatha Mayer, MD;  Location: [facility] ENDOSCOPY;  Service: Endoscopy;  Laterality: N/A;  . Colonoscopy N/A 08/08/2014    Procedure: COLONOSCOPY;  Surgeon: Gatha Mayer, MD;  Location: [place];  Service: Endoscopy;  Laterality: N/A;  . Flexible sigmoidoscopy N/A 09/21/2014    Procedure: FLEXIBLE SIGMOIDOSCOPY;  Surgeon: Jerene Bears, MD;  Location: [place];  Service: Gastroenterology;  Laterality:  N/A;  . Givens capsule study N/A 09/21/2014    Procedure: GIVENS CAPSULE STUDY;  Surgeon: Jerene Bears, MD;  Location: [facility] ENDOSCOPY;  Service: Endoscopy;  Laterality: N/A;  . Left and right heart catheterization with coronary angiogram N/A 06/22/2014    Procedure: LEFT AND RIGHT HEART CATHETERIZATION WITH CORONARY ANGIOGRAM;  Surgeon: Larey Dresser, MD;  Location: [facility] CATH LAB;  Service: Cardiovascular;  Laterality: N/A;  . Percutaneous coronary stent intervention (pci-s)  06/22/2014    Procedure: PERCUTANEOUS CORONARY STENT INTERVENTION (PCI-S);  Surgeon: Larey Dresser, MD;  Location: [facility] CATH LAB;  Service: Cardiovascular;;  . Colonoscopy N/A 01/25/2015    Procedure: COLONOSCOPY;  Surgeon: Gatha Mayer, MD;  Location: [facility] ENDOSCOPY;  Service: Endoscopy;  Laterality: N/A;  . Cystoscopy with biopsy Right 02/03/2015    Procedure: CYSTOSCOPY WITH COLD CUP BLADDER BIOPSY CAUTHERIZAITON OF RIGHT BLADDER CANCER AND SATILITE, TURBT RIGHT BLADDER BASE;  Surgeon: Ailene Rud, MD;  Location: [facility] ORS;  Service: Urology;  Laterality: Right;  . Esophagogastroduodenoscopy N/A 03/03/2015    Procedure: ESOPHAGOGASTRODUODENOSCOPY (EGD);  Surgeon: Jerene Bears, MD;  Location: [facility] ENDOS[facility];  Service: Gastroenterology;  Laterality: N/A;  . Gastrojejunostomy N/A 03/09/2015    Procedure: LAPAROSCOPIC GASTROJEJUNOSTOMY;  Surgeon: Excell Seltzer, MD;  Location: [facility] ORS;  Service: General;  Laterality: N/A;  . Portacath placement Right 04/05/2015    Procedure: INSERTION PORT-A-CATH;  Surgeon: Excell Seltzer, MD;  Location: [place];  Service: General;  Laterality: Right;    Clayton Bibles, MS, RD, LDN Pager: [phone] After Hours Pager: [phone]

## 2015-04-13 NOTE — Progress Notes (Signed)
PT Cancellation Note  Patient Details Name: James [name] MRN: [id number] DOB: 08-16-1928   Cancelled Treatment:    Reason Eval/Treat Not Completed: Fatigue/lethargy limiting ability to participate (pt feeling very weak, also sleepy after receiving Zofran per daughter.  will check back as schedule permits.)   Janith Nielson,KATHrine E 04/13/2015, 9:23 AM Carmelia Bake, PT, DPT 04/13/2015 Pager: [phone]

## 2015-04-13 NOTE — Progress Notes (Signed)
eLink Physician-Brief Progress Note Patient Name: James [name] DOB: [birth year] MRN: [id number]   Date of Service  04/13/2015  HPI/Events of Note  Needs DVT prophylaxis. Admitted with GI bleed.   eICU Interventions  Will order SCD's.     Intervention Category Intermediate Interventions: Best-practice therapies (e.g. DVT, beta blocker, etc.)  Shavon Zenz Eugene 04/13/2015, 8:35 PM

## 2015-04-14 ENCOUNTER — Other Ambulatory Visit: Payer: Self-pay

## 2015-04-14 ENCOUNTER — Encounter (HOSPITAL_COMMUNITY)
Admission: EM | Disposition: A | Discharge status: Home / Self Care | Payer: TRICARE For Life (TFL) | Source: Home / Self Care | Attending: Family Medicine

## 2015-04-14 ENCOUNTER — Encounter (HOSPITAL_COMMUNITY): Payer: TRICARE For Life (TFL)

## 2015-04-14 ENCOUNTER — Encounter (HOSPITAL_COMMUNITY): Payer: Self-pay | Admitting: Internal Medicine

## 2015-04-14 DIAGNOSIS — D500 Iron deficiency anemia secondary to blood loss (chronic): Secondary | ICD-10-CM

## 2015-04-14 DIAGNOSIS — I10 Essential (primary) hypertension: Secondary | ICD-10-CM

## 2015-04-14 DIAGNOSIS — R5381 Other malaise: Secondary | ICD-10-CM

## 2015-04-14 DIAGNOSIS — I509 Heart failure, unspecified: Secondary | ICD-10-CM

## 2015-04-14 DIAGNOSIS — K920 Hematemesis: Secondary | ICD-10-CM

## 2015-04-14 DIAGNOSIS — R5383 Other fatigue: Secondary | ICD-10-CM

## 2015-04-14 DIAGNOSIS — D6481 Anemia due to antineoplastic chemotherapy: Secondary | ICD-10-CM

## 2015-04-14 HISTORY — PX: ESOPHAGOGASTRODUODENOSCOPY: SHX5428

## 2015-04-14 LAB — BASIC METABOLIC PANEL WITH GFR
Anion gap: 8 (ref 5–15)
BUN: 56 mg/dL — ABNORMAL HIGH (ref 6–23)
CO2: 21 mmol/L (ref 19–32)
Calcium: 8.4 mg/dL (ref 8.4–10.5)
Chloride: 109 mmol/L (ref 96–112)
Creatinine, Ser: 1.72 mg/dL — ABNORMAL HIGH (ref 0.50–1.35)
GFR calc Af Amer: 40 mL/min — ABNORMAL LOW (ref >90)
GFR calc non Af Amer: 35 mL/min — ABNORMAL LOW (ref >90)
Glucose, Bld: 152 mg/dL — ABNORMAL HIGH (ref 70–99)
Potassium: 4 mmol/L (ref 3.5–5.1)
Sodium: 138 mmol/L (ref 135–145)

## 2015-04-14 LAB — CBC
HCT: 27.6 % — ABNORMAL LOW (ref 39.0–52.0)
Hemoglobin: 8.9 g/dL — ABNORMAL LOW (ref 13.0–17.0)
MCH: 28.6 pg (ref 26.0–34.0)
MCHC: 32.2 g/dL (ref 30.0–36.0)
MCV: 88.7 fL (ref 78.0–100.0)
Platelets: 301 10*3/uL (ref 150–400)
RBC: 3.11 MIL/uL — AB (ref 4.22–5.81)
RDW: 18.8 % — ABNORMAL HIGH (ref 11.5–15.5)
WBC: 9.2 10*3/uL (ref 4.0–10.5)

## 2015-04-14 LAB — AMMONIA: AMMONIA: 91 umol/L — AB (ref 11–32)

## 2015-04-14 LAB — PREPARE RBC (CROSSMATCH)

## 2015-04-14 LAB — URINE CULTURE
Colony Count: NO GROWTH
Culture: NO GROWTH

## 2015-04-14 LAB — HEMOGLOBIN AND HEMATOCRIT, BLOOD
HCT: 28.1 % — ABNORMAL LOW (ref 39.0–52.0)
HCT: 30.7 % — ABNORMAL LOW (ref 39.0–52.0)
HEMOGLOBIN: 9.6 g/dL — AB (ref 13.0–17.0)
Hemoglobin: 9 g/dL — ABNORMAL LOW (ref 13.0–17.0)

## 2015-04-14 SURGERY — EGD (ESOPHAGOGASTRODUODENOSCOPY)
Anesthesia: Moderate Sedation

## 2015-04-14 MED ORDER — DILTIAZEM HCL 100 MG IV SOLR
5.0000 mg/h | INTRAVENOUS | Status: DC
  Administered 2015-04-14: 5 mg/h via INTRAVENOUS
  Filled 2015-04-14: qty 100

## 2015-04-14 MED ORDER — MIDAZOLAM HCL 10 MG/2ML IJ SOLN
INTRAMUSCULAR | Status: DC | PRN
  Administered 2015-04-14: 1 mg via INTRAVENOUS
  Administered 2015-04-14: 2 mg via INTRAVENOUS

## 2015-04-14 MED ORDER — FENTANYL CITRATE (PF) 100 MCG/2ML IJ SOLN
INTRAMUSCULAR | Status: AC
  Filled 2015-04-14: qty 2

## 2015-04-14 MED ORDER — NOREPINEPHRINE BITARTRATE 1 MG/ML IV SOLN
0.0000 ug/min | INTRAVENOUS | Status: DC
  Administered 2015-04-14: 5.333 ug/min via INTRAVENOUS
  Filled 2015-04-14: qty 4

## 2015-04-14 MED ORDER — SODIUM CHLORIDE 0.9 % IV SOLN
INTRAVENOUS | Status: DC
  Administered 2015-04-14 – 2015-04-15 (×3): via INTRAVENOUS

## 2015-04-14 MED ORDER — MIDAZOLAM HCL 10 MG/2ML IJ SOLN
INTRAMUSCULAR | Status: AC
  Filled 2015-04-14: qty 2

## 2015-04-14 MED ORDER — FENTANYL CITRATE (PF) 100 MCG/2ML IJ SOLN
INTRAMUSCULAR | Status: DC | PRN
  Administered 2015-04-14: 25 ug via INTRAVENOUS

## 2015-04-14 MED ORDER — DILTIAZEM LOAD VIA INFUSION
10.0000 mg | Freq: Once | INTRAVENOUS | Status: AC
  Administered 2015-04-14: 10 mg via INTRAVENOUS
  Filled 2015-04-14: qty 10

## 2015-04-14 MED ORDER — SODIUM CHLORIDE 0.9 % IV BOLUS (SEPSIS)
250.0000 mL | Freq: Once | INTRAVENOUS | Status: AC
  Administered 2015-04-14: 250 mL via INTRAVENOUS

## 2015-04-14 NOTE — Progress Notes (Signed)
IP PROGRESS NOTE  Subjective:   Mr. Piscopo had several episodes of dark emesis yesterday. His daughter reports he became more alert after I saw him yesterday morning.  Objective: Vital signs in last 24 hours: Blood pressure 106/21, pulse 58, temperature 98.1 F (36.7 C), temperature source Oral, resp. rate 16, height 5\' 11"  (1.803 m), weight 205 lb 11.2 oz (93.305 kg), SpO2 98 %.  Intake/Output from previous day: 04/21 0701 - 04/22 0700 In: 1867.5 [I.V.:1207.5; Blood:660] Out: 500 [Urine:200; Emesis/NG output:300]  Physical Exam:  HEENT: No thrush Lungs: Clear anteriorly Cardiac: Regular rate and rhythm Abdomen: Soft and nontender Extremities: No leg edema Neurologic: Alert, follows simple commands, he is verbal, but not answering appropriately.   Portacath/PICC-without erythema  Lab Results:  Recent Labs  04/13/15 0530  04/14/15 0645 04/14/15 1239  WBC 16.6*  --  9.2  --   HGB 8.6*  < > 8.9* 9.0*  HCT 27.5*  < > 27.6* 28.1*  PLT 438*  --  301  --   < > = values in this interval not displayed.  BMET  Recent Labs  04/13/15 0530 04/14/15 0645  NA 137 138  K 4.7 4.0  CL 105 109  CO2 21 21  GLUCOSE 151* 152*  BUN 63* 56*  CREATININE 1.79* 1.72*  CALCIUM 9.0 8.4    Studies/Results: Ct Head Wo Contrast  04/12/2015   CLINICAL DATA:  81 year old male with altered mental status. Medical history includes gastric cancer metastatic to the liver and ongoing chemotherapy.  EXAM: CT HEAD WITHOUT CONTRAST  TECHNIQUE: Contiguous axial images were obtained from the base of the skull through the vertex without intravenous contrast.  COMPARISON:  Prior PET-CT 03/29/2015; prior brain MRI 01/06/2013; prior head CT 01/16/2011  FINDINGS: Negative for acute intracranial hemorrhage, acute infarction, mass, mass effect, hydrocephalus or midline shift. Gray-white differentiation is preserved throughout. Cerebral and cerebellar cortical volume loss. Mild chronic microvascular ischemic  white matter disease. No focal soft tissue or calvarial abnormality. Symmetric globes bilaterally. Normal aeration of the mastoid air cells paranasal sinuses. Mild hyperostosis frontalis interna. Intracranial atherosclerosis.  IMPRESSION: No acute intracranial abnormality.  Stable atrophy and mild chronic microvascular ischemic white matter disease.   Electronically Signed   By: Jacqulynn Cadet M.D.   On: 04/12/2015 20:11   Dg Chest Port 1 View  04/12/2015   CLINICAL DATA:  81 year old male with 1 day history of weakness and lethargy. Current clinical history includes gastric cancer metastatic to the liver. Patient received chemotherapy yesterday.  EXAM: PORTABLE CHEST - 1 VIEW  COMPARISON:  Prior chest x-ray 04/05/2015  FINDINGS: Single-lumen power injectable port catheter via a right internal jugular venous approach. Catheter tip projects over the mid SVC. Mild cardiomegaly. Atherosclerotic calcifications present in the aorta. No focal airspace consolidation, pleural effusion or pneumothorax. No evidence of pulmonary edema. Inspiratory volumes are slightly low with mild bibasilar atelectasis. No acute osseous abnormality.  IMPRESSION: Low inspiratory volumes with minimal bibasilar atelectasis.  No acute cardiopulmonary process.   Electronically Signed   By: Jacqulynn Cadet M.D.   On: 04/12/2015 18:31    Medications: I have reviewed the patient's current medications.  Assessment/Plan:  1.Duodenal mass, abdominal/retroperitoneal lymphadenopathy, and liver metastases noted on a CT 03/01/2015  Upper endoscopy 03/03/2015 confirmed a duodenal mass, status post a biopsy with the final pathology confirming a carcinoma, immunohistochemical stains negative for lymphoma, neuroendocrine carcinoma, and melanoma markers. Positive staining for CDX2, cytokeratin AE1/AE3, and CD10  PET scan 03/29/2015 showed a large  intensely hypermetabolic mass circumferentially narrowing the second portion of the duodenum.  Adjacent hypermetabolic lymph nodes medial to the duodenum mass. Irregular hypermetabolic nodal mass in the inferior aspect of the gastrohepatic ligament. Hypermetabolic nodal metastasis in the retroperitoneum left of the aorta. No abnormal metabolic activity within the enlarged inferior right lobe of the liver.  Cycle 1 FOLFOX 04/11/2015  2. Gastric outlet obstruction secondary to #1, status post a palliative gastrojejunostomy 03/09/2015  3. History of transfusion-dependent anemia secondary to chronic GI bleeding-hemoglobin stable    Progressive anemia secondary to chemotherapy and GI bleeding  4. Portal vein thrombosis diagnosed in September 2015  5. Left leg DVT December 2014  6. History of coronary artery disease, status post an LAD drug-eluting stent July 2015  7. Remote history of melanoma of the right ear  8. 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 History of drug related ITP  10. Abdominal pain secondary to the duodenal mass/liver metastases/adenopathy  11. Neuropathy  12. Altered mental status 04/12/2015  James [name] is admitted with an altered mental status. He remains confused. I suspect this is related to delirium in the setting of advanced metastatic small bowel carcinoma and liver/renal dysfunction. He had dark emesis yesterday. He could be bleeding from the upper small bowel tumor or varices.  Recommendations: 1. Gastroenterology consult to consider an upper endoscopy, management of GI bleeding 2. Continue supportive care, follow-up blood cultures 3. Please call oncology as needed over the weekend. I will check on him 04/17/2015.    LOS: 2 days   Javaris Wigington, Dominica Severin  04/14/2015, 2:40 PM

## 2015-04-14 NOTE — Progress Notes (Signed)
Bedside EGD. Report given to ICU nurse. Pt tolerated well with decreased dyastolic blood pressure from sedation.

## 2015-04-14 NOTE — Progress Notes (Signed)
PT Cancellation Note  Patient Details Name: James [name] MRN: [id number] DOB: [birth year]   Cancelled Treatment:    Reason Eval/Treat Not Completed: Medical issues which prohibited therapy (RN reports pt is still sedated after procedure)   Alyla Pietila,KATHrine E 04/14/2015, 2:09 PM Carmelia Bake, PT, DPT 04/14/2015 Pager: [phone]

## 2015-04-14 NOTE — Consult Note (Signed)
Referring Provider: [facility] Primary Care Physician:  [name], MD Primary Gastroenterologist:  Dr. Carlean Purl  Reason for Consultation:  Hematemesis  HPI: James [name] is a 81 y.o. male who is a patient of Dr. Celesta Aver and is well-known to the GI practice. He has a history of chronic diastolic CHF, DVT, portal vein thrombosis, iron deficiency anemia, bradycardia, carotid bruit, hypercholesterolemia, vasculitis, thrombophlebitis, arthritis, gout, esophageal varices, and nonobstructive coronary artery disease with a DES for which he was previously on Plavix. In November 2014 he experienced a left leg DVT and was on Xarelto for several months. Several months later was found to be anemic with a level of 7.9 and was transfused and in August 2015 had an EGD that showed portal gastropathy. In September 2015 he was found to have portal vein thrombosis and started on Coumadin and then developed rectal bleeding and was admitted to [facility]. Workup included capsule endoscopy that showed esophageal varices and evidence of portal hypertension. Colonoscopy showed small bowel AVMs as well as portal colopathy. In November he developed hematuria and had a urologic evaluation and was found to have a bladder tumor which was resected. 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 Due to bleeding his Coumadin was stopped on 02/20/2015. EGD was done in February 2016 and showed no bleeding site capsule study in February 2016 showed a site of bleeding in the terminal ileum. In early March of this year he was seen in the office with right sided abdominal pain of 10 days' duration. He was sent to the emergency room where he had a CT that showed bulky duodenal mass with significant adjacent right upper quadrant lymphadenopathy. There was also hepatic metastatic disease, retroperitoneal lymphadenopathy, and portal vein thrombosis. Upper  endoscopy in 03/03/2015 by Dr. Elmo Putt revealed small varices in the lower esophagus and a large amount of residual food in the stomach. A fungating mass was found in the duodenal bulb immediately past the pylorus and the scope could not be advanced into the second portion of the duodenum. Pathology revealed a high-grade malignancy. On March 17 he underwent a laparoscopic gastrojejunostomy. He is well-known to Dr. Benay Spice and started a cycle of FOLFOX on April 19. He came to the emergency room on the 20th with confusion. His daughters reported that he had been confused for a day. Today his 2 daughters are with him and they note that since surgery he has had intermittent episodes of brown liquid vomit. Last night while in the hospital he apparently had frank hematemesis. He was started on octreotide and PPI drip. He was given 2 units of packed red blood cells. He has had no further vomiting since then. He denies abdominal pain at this time.    Past Medical History  Diagnosis Date  . Bradycardia     Hypertensive  . carotid bruit, right   . hypercholesterolemia     Trig 234/293;takes Simcor daily  . Obesity, morbid   . Vasculitis   . 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 Beryle Beams; Dr. Beryle Beams , normal platelets  . Dermatitis     legs  . Bleeding gums   . Chronic low back pain   . Myalgia and myositis   . Unspecified vitamin D deficiency   . Bladder diverticulum   . Primary osteoarthritis of both knees   . Nephrolithiasis   . History of elevated glucose   . Thrombophlebitis  right forearm  . History of BPH   . History of colonic polyps     [name]  . Epididymitis, left     S/P Epidimectomy  . Splenomegaly 05/15/09    abd U/S NML  . Hypertension, benign     takes Micardis daily  . Neuropathy     acute  . Bruises easily   . H/O hiatal hernia   . GERD (gastroesophageal reflux disease)     hx of but doesn't take any meds now  .  Urinary frequency   . Urinary urgency   . Nocturia   . Urinary leakage   . Abnormality of gait 03/19/2013  . DVT of lower limb, acute 11/19/2013    Peroneal & distal tibial left 11/01/13  . Polio 1941    "mild case"  . CHF (congestive heart failure)     "low grade" (06/22/2014)  . DVT (deep venous thrombosis) 10/2013    LLE  . Pneumonia, bacterial     RML resolved, spirometry, normal  . Pneumonia 1933  . History of blood transfusion 05/2014  . Anemia 05/2014    "related to Xarelto"  . Iron deficiency anemia 05/2014    "got iron infusion"  . Arthritis     "all over"  . Gout   . Malignant melanoma of ear     right  . Thrombophlebitis of superficial veins of right lower extremity 06/23/2014    Incidental finding doppler 05/30/14  . Shortness of breath   . Esophageal varices without mention of bleeding 08/08/2014  . Portal vein thrombosis   . Duodenal cancer     Past Surgical History  Procedure Laterality Date  . Cystoscopy w/ stone manipulation  1950's  . Fem cutaneous nerve entrapment  1977    due to surgery (mild lat anesth of bilat legs)  . Limited pelvis/hip bone scan  04/99    negative  . Bone scan  04/00  . Cataract extraction w/ intraocular lens  implant, bilateral Bilateral ~ 2004  . Melanoma excision Right 06/04    ear; Wide local excision,,with flap  . Stress cardiolite  05/11/04    mild inf. ischemia, EF 71%  . Shoulder surgery Left 06/17/2005    "tore it all to pieces when I fell; not fractured"  . Bronchoscopy  09/08    RML collapse with chronic pneumonia, bx neg (Dr. Joya Gaskins)  . Cyst excision Right 03/07/09    middle finger, DIP Joint Mucoid (Dr. Fredna Dow)  . Eyelid & eyebrow lift  1996  . Cyst excision Right 08/15/09    middle finger, DIP Joint Mucoid Cyst (Dr. Fredna Dow)  . Appendectomy  1950  . Excision of mucoid tumor    . Colonoscopy    . Total knee arthroplasty  01/10/2012    Procedure: TOTAL KNEE ARTHROPLASTY;  Surgeon: Augustin Schooling, MD;  Location: [place];   Service: Orthopedics;  Laterality: Left;  . Surgery scrotal / testicular Left ~ 2000    removal of mass, noncancerous  . Cardiac catheterization  05/23/04    mild plague-statin  . Coronary angioplasty with stent placement  06/22/2014    to proximal LAD  . Inguinal hernia repair Bilateral 1959  . Inguinal hernia repair Left 1970's  . Esophagogastroduodenoscopy N/A 08/08/2014    Procedure: ESOPHAGOGASTRODUODENOSCOPY (EGD);  Surgeon: Gatha Mayer, MD;  Location: [facility] ENDOSCOPY;  Service: Endoscopy;  Laterality: N/A;  . Colonoscopy N/A 08/08/2014    Procedure: COLONOSCOPY;  Surgeon: Gatha Mayer, MD;  Location:  [place] ENDOSCOPY;  Service: Endoscopy;  Laterality: N/A;  . Flexible sigmoidoscopy N/A 09/21/2014    Procedure: FLEXIBLE SIGMOIDOSCOPY;  Surgeon: Jerene Bears, MD;  Location: [place];  Service: Gastroenterology;  Laterality: N/A;  . Givens capsule study N/A 09/21/2014    Procedure: GIVENS CAPSULE STUDY;  Surgeon: Jerene Bears, MD;  Location: [name] ENDOSCOPY;  Service: Endoscopy;  Laterality: N/A;  . Left and right heart catheterization with coronary angiogram N/A 06/22/2014    Procedure: LEFT AND RIGHT HEART CATHETERIZATION WITH CORONARY ANGIOGRAM;  Surgeon: Larey Dresser, MD;  Location: [facility] CATH LAB;  Service: Cardiovascular;  Laterality: N/A;  . Percutaneous coronary stent intervention (pci-s)  06/22/2014    Procedure: PERCUTANEOUS CORONARY STENT INTERVENTION (PCI-S);  Surgeon: Larey Dresser, MD;  Location: [facility] CATH LAB;  Service: Cardiovascular;;  . Colonoscopy N/A 01/25/2015    Procedure: COLONOSCOPY;  Surgeon: Gatha Mayer, MD;  Location: [facility] ENDOSCOPY;  Service: Endoscopy;  Laterality: N/A;  . Cystoscopy with biopsy Right 02/03/2015    Procedure: CYSTOSCOPY WITH COLD CUP BLADDER BIOPSY CAUTHERIZAITON OF RIGHT BLADDER CANCER AND SATILITE, TURBT RIGHT BLADDER BASE;  Surgeon: Ailene Rud, MD;  Location: [facility] ORS;  Service: Urology;  Laterality: Right;  . Esophagogastroduodenoscopy N/A  03/03/2015    Procedure: ESOPHAGOGASTRODUODENOSCOPY (EGD);  Surgeon: Jerene Bears, MD;  Location: [facility] ENDOSCOPY;  Service: Gastroenterology;  Laterality: N/A;  . Gastrojejunostomy N/A 03/09/2015    Procedure: LAPAROSCOPIC GASTROJEJUNOSTOMY;  Surgeon: Excell Seltzer, MD;  Location: [facility] ORS;  Service: General;  Laterality: N/A;  . Portacath placement Right 04/05/2015    Procedure: INSERTION PORT-A-CATH;  Surgeon: Excell Seltzer, MD;  Location: [place];  Service: General;  Laterality: Right;    Prior to Admission medications   Medication Sig Start Date End Date Taking? Authorizing Provider  aspirin EC 81 MG tablet Take 81 mg by mouth every evening.    Yes Historical Provider, MD  Cholecalciferol (VITAMIN D3) 2000 UNITS capsule Take 2,000 Units by mouth every morning.    Yes Historical Provider, MD  feeding supplement, ENSURE ENLIVE, (ENSURE ENLIVE) LIQD Take 237 mLs by mouth 3 (three) times daily between meals. 03/16/15  Yes Debbe Odea, MD  finasteride (PROSCAR) 5 MG tablet Take 5 mg by mouth daily.   Yes Historical Provider, MD  furosemide (LASIX) 40 MG tablet Take 1 tablet (40 mg total) by mouth daily. 02/28/15  Yes Larey Dresser, MD  iron polysaccharides (NIFEREX) 150 MG capsule Take 150 mg by mouth 2 (two) times daily.   Yes Historical Provider, MD  lidocaine-prilocaine (EMLA) cream Apply 1 application topically as needed. 04/06/15  Yes Owens Shark, NP  LIVALO 4 MG TABS TAKE 1 TABLET AT BEDTIME 10/03/14  Yes Jolaine Artist, MD  Multiple Vitamin (MULTIVITAMIN WITH MINERALS) TABS tablet Take 1 tablet by mouth daily.   Yes Historical Provider, MD  OVER THE COUNTER MEDICATION Place 2 drops into both eyes 4 (four) times daily. Wash type eye drop-   Yes Historical Provider, MD  oxyCODONE-acetaminophen (ROXICET) 5-325 MG per tablet Take 1 tablet by mouth every 4 (four) hours as needed for severe pain. 03/21/15  Yes Ladell Pier, MD  pantoprazole (PROTONIX) 40 MG tablet Take  40 mg by mouth daily.    Yes Historical Provider, MD  predniSONE (DELTASONE) 5 MG tablet Take 1 tablet (5 mg total) by mouth daily with breakfast. 03/31/15  Yes Owens Shark, NP  Probiotic Product (PROBIOTIC & ACIDOPHILUS EX ST PO) Take 1  tablet by mouth every morning.    Yes Historical Provider, MD  prochlorperazine (COMPAZINE) 5 MG tablet Take 1 tablet (5 mg total) by mouth every 6 (six) hours as needed for nausea or vomiting. 03/21/15  Yes Ladell Pier, MD  Psyllium (METAMUCIL PO) Take 20 mLs by mouth every evening.   Yes Historical Provider, MD  ranitidine (ZANTAC) 150 MG tablet Take 150 mg by mouth at bedtime.   Yes Historical Provider, MD  spironolactone (ALDACTONE) 25 MG tablet Take 1 tablet (25 mg total) by mouth daily. 10/26/14  Yes Larey Dresser, MD  allopurinol (ZYLOPRIM) 300 MG tablet Take 0.5 tablets (150 mg total) by mouth every morning. 10/31/14   Tonia Ghent, MD  allopurinol (ZYLOPRIM) 300 MG tablet TAKE 1 TABLET DAILY 04/03/15   Tonia Ghent, MD  nitroGLYCERIN (NITROSTAT) 0.4 MG SL tablet Place 1 tablet (0.4 mg total) under the tongue every 5 (five) minutes as needed. For chest pain Patient not taking: Reported on 03/31/2015 07/07/14   Larey Dresser, MD    Current Facility-Administered Medications  Medication Dose Route Frequency Provider Last Rate Last Dose  . acetaminophen (TYLENOL) tablet 650 mg  650 mg Oral Q6H PRN Rise Patience, MD       Or  . acetaminophen (TYLENOL) suppository 650 mg  650 mg Rectal Q6H PRN Rise Patience, MD      . acetaminophen (TYLENOL) tablet 650 mg  650 mg Oral Once Chauncey Cruel, MD   Stopped at 04/14/15 0000  . dextrose 5 %-0.9 % sodium chloride infusion   Intravenous Continuous Radene Gunning, NP 50 mL/hr at 04/14/15 0138    . diphenhydrAMINE (BENADRYL) capsule 25 mg  25 mg Oral Once Chauncey Cruel, MD   Stopped at 04/14/15 0000  . feeding supplement (ENSURE ENLIVE) (ENSURE ENLIVE) liquid 237 mL  237 mL Oral TID BM Clayton Bibles, RD   237 mL at 04/13/15 1123  . fentaNYL (SUBLIMAZE) injection 25-50 mcg  25-50 mcg Intravenous Q5 min PRN Suzette Battiest, MD      . finasteride (PROSCAR) tablet 5 mg  5 mg Oral Daily Rise Patience, MD   5 mg at 04/13/15 1011  . heparin lock flush 100 unit/mL  500 Units Intracatheter Daily PRN Chauncey Cruel, MD      . heparin lock flush 100 unit/mL  500 Units Intracatheter Daily PRN Annia Belt, MD      . heparin lock flush 100 unit/mL  250 Units Intracatheter PRN Annia Belt, MD      . iron polysaccharides (NIFEREX) capsule 150 mg  150 mg Oral BID Rise Patience, MD   Stopped at 04/13/15 2200  . lactulose (CHRONULAC) 10 GM/15ML solution 10 g  10 g Oral BID Ladell Pier, MD   Stopped at 04/13/15 2200  . multivitamin with minerals tablet 1 tablet  1 tablet Oral Daily Rise Patience, MD   1 tablet at 04/13/15 1008  . nitroGLYCERIN (NITROSTAT) SL tablet 0.4 mg  0.4 mg Sublingual Q5 min PRN Rise Patience, MD      . octreotide (SANDOSTATIN) 500 mcg in sodium chloride 0.9 % 250 mL (2 mcg/mL) infusion  50 mcg/hr Intravenous Continuous Oswald Hillock, MD 25 mL/hr at 04/14/15 0644 50 mcg/hr at 04/14/15 0644  . ondansetron (ZOFRAN) tablet 8 mg  8 mg Oral Q6H PRN Radene Gunning, NP       Or  . ondansetron [facility]) 8  mg in sodium chloride 0.9 % 50 mL IVPB  8 mg Intravenous Q6H PRN Gwenyth Bender, NP      . oxyCODONE-acetaminophen (PERCOCET/ROXICET) 5-325 MG per tablet 1 tablet  1 tablet Oral Q4H PRN Eduard Clos, MD      . pantoprazole (PROTONIX) 80 mg in sodium chloride 0.9 % 250 mL (0.32 mg/mL) infusion  8 mg/hr Intravenous Continuous Meredeth Ide, MD 25 mL/hr at 04/13/15 2112 8 mg/hr at 04/13/15 2112  . [START ON 04/17/2015] pantoprazole (PROTONIX) injection 40 mg  40 mg Intravenous Q12H Meredeth Ide, MD      . pravastatin (PRAVACHOL) tablet 20 mg  20 mg Oral q1800 Eduard Clos, MD      . predniSONE (DELTASONE) tablet 5 mg  5 mg Oral Q  breakfast Eduard Clos, MD   5 mg at 04/13/15 1007  . prochlorperazine (COMPAZINE) tablet 5 mg  5 mg Oral Q6H PRN Eduard Clos, MD      . psyllium (HYDROCIL/METAMUCIL) packet 1 packet  1 packet Oral Daily Eduard Clos, MD   1 packet at 04/13/15 1000  . sodium chloride 0.9 % injection 10 mL  10 mL Intracatheter PRN Lowella Dell, MD      . sodium chloride 0.9 % injection 10 mL  10 mL Intracatheter PRN Levert Feinstein, MD      . sodium chloride 0.9 % injection 3 mL  3 mL Intravenous Q12H Eduard Clos, MD   3 mL at 04/12/15 2315  . sodium chloride 0.9 % injection 3 mL  3 mL Intracatheter PRN Levert Feinstein, MD        Allergies as of 04/12/2015 - Review Complete 04/12/2015  Allergen Reaction Noted  . Sulfa antibiotics Other (See Comments) 06/16/2014  . Aleve [naproxen sodium] Other (See Comments) 07/29/2014  . Rofecoxib Other (See Comments)   . Sulfamethoxazole-trimethoprim Other (See Comments) 06/09/2009  . Tape Other (See Comments) 02/03/2015  . Tramadol hcl Other (See Comments) 12/07/2008  . Neomycin-bacitracin zn-polymyx Rash 06/16/2014  . Neosporin [neomycin-polymyxin-gramicidin] Rash 12/31/2011  . Penicillins Swelling and Rash     Family History  Problem Relation Age of Onset  . Thyroid cancer Mother   . Diabetes Father   . Heart disease Father   . Stroke Brother     cerebral hemm  . Colon cancer Brother   . Anesthesia problems Neg Hx   . Hypotension Neg Hx   . Malignant hyperthermia Neg Hx   . Pseudochol deficiency Neg Hx   . Prostate cancer Neg Hx   . Heart attack Father     History   Social History  . Marital Status: Widowed    Spouse Name: N/A  . Number of Children: 2  . Years of Education: college   Occupational History  . Retired     1996 [employer]   Social History Main Topics  . Smoking status: Former Smoker -- 2.00 packs/day for 20 years    Types: Cigarettes, Cigars    Quit date: 09/27/1967  .  Smokeless tobacco: Never Used  . Alcohol Use: No  . Drug Use: No  . Sexual Activity: Not on file   Other Topics Concern  . Not on file   Social History Narrative   Widowed 2013 after 63+ years of marriage, Wife had CHF and multiple myeloma   Rinaldo Cloud, daughter in Social worker, lives with patient   Patient does not get regular exercise  No caffeine   Retired from [employer] ([employer]), retired from [employer]: Per history of present illness otherwise negative  Physical Exam: Vital signs in last 24 hours: Temp:  [97.4 F (36.3 C)-98.7 F (37.1 C)] 97.5 F (36.4 C) (04/22 0424) Pulse Rate:  [45-76] 45 (04/22 0700) Resp:  [13-24] 13 (04/22 0700) BP: (97-151)/(28-81) 107/28 mmHg (04/22 0700) SpO2:  [97 %-100 %] 97 % (04/22 0700) Last BM Date: 04/12/15 General:   Lethargic ill appearing male in NAD Head:  Normocephalic and atraumatic. Eyes:  Sclera clear, no icterus. Conjunctiva pink. Ears:  Normal auditory acuity. Nose:  No deformity, discharge,  or lesions. Mouth:  No deformity or lesions.   Neck:  Supple; no masses or thyromegaly. Lungs:  Clear throughout to auscultation.   No wheezes, crackles, or rhonchi. Heart:  Regular rate and rhythm; no murmurs, clicks, rubs,  or gallops. Abdomen:  Soft,nontender, BS acti.   Rectal:  Deferred  Msk:  Symmetrical without gross deformities. . Pulses:  Normal pulses noted. Extremities: Without clubbing or edema. Neurologic:Sleepy, easily rousable Skin:  Intact without significant lesions or rashes..   Intake/Output from previous day: 04/21 0701 - 04/22 0700 In: 1717.5 [I.V.:1057.5; Blood:660] Out: 500 [Urine:200; Emesis/NG output:300] Intake/Output this shift:    Lab Results:  Recent Labs  04/12/15 1816 04/13/15 0530 04/13/15 1905 04/14/15 0645  WBC 19.7* 16.6*  --  9.2  HGB 7.7* 8.6* 8.2* 8.9*  HCT 25.1* 27.5* 26.5* 27.6*  PLT 475* 438*  --  301   BMET  Recent Labs  04/12/15 1816 04/13/15 0530  04/14/15 0645  NA 135 137 138  K 4.6 4.7 4.0  CL 101 105 109  CO2 _0 GLUCOSE 146* 151* 152*  BUN 55* 63* 56*  CREATININE 1.84* 1.79* 1.72*  CALCIUM 8.9 9.0 8.4   LFT  Recent Labs  04/13/15 0530  PROT 6.3  ALBUMIN 3.1*  AST 49*  ALT 93*  ALKPHOS 357*  BILITOT 1.1      Studies/Results: Ct Head Wo Contrast  04/12/2015   CLINICAL DATA:  81 year old male with altered mental status. Medical history includes gastric cancer metastatic to the liver and ongoing chemotherapy.  EXAM: CT HEAD WITHOUT CONTRAST  TECHNIQUE: Contiguous axial images were obtained from the base of the skull through the vertex without intravenous contrast.  COMPARISON:  Prior PET-CT 03/29/2015; prior brain MRI 01/06/2013; prior head CT 01/16/2011  FINDINGS: Negative for acute intracranial hemorrhage, acute infarction, mass, mass effect, hydrocephalus or midline shift. Gray-white differentiation is preserved throughout. Cerebral and cerebellar cortical volume loss. Mild chronic microvascular ischemic white matter disease. No focal soft tissue or calvarial abnormality. Symmetric globes bilaterally. Normal aeration of the mastoid air cells paranasal sinuses. Mild hyperostosis frontalis interna. Intracranial atherosclerosis.  IMPRESSION: No acute intracranial abnormality.  Stable atrophy and mild chronic microvascular ischemic white matter disease.   Electronically Signed   By: Jacqulynn Cadet M.D.   On: 04/12/2015 20:11   Dg Chest Port 1 View  04/12/2015   CLINICAL DATA:  81 year old male with 1 day history of weakness and lethargy. Current clinical history includes gastric cancer metastatic to the liver. Patient received chemotherapy yesterday.  EXAM: PORTABLE CHEST - 1 VIEW  COMPARISON:  Prior chest x-ray 04/05/2015  FINDINGS: Single-lumen power injectable port catheter via a right internal jugular venous approach. Catheter tip projects over the mid SVC. Mild cardiomegaly. Atherosclerotic calcifications present in  the aorta. No focal airspace  consolidation, pleural effusion or pneumothorax. No evidence of pulmonary edema. Inspiratory volumes are slightly low with mild bibasilar atelectasis. No acute osseous abnormality.  IMPRESSION: Low inspiratory volumes with minimal bibasilar atelectasis.  No acute cardiopulmonary process.   Electronically Signed   By: Jacqulynn Cadet M.D.   On: 04/12/2015 18:31    IMPRESSION/PLAN: 81 year old male with a history of a duodenal mass and associated abdominal retroperitoneal lymphadenopathy as well as liver metastases found on CT in early March. Upper endoscopy on March 11 confirmed a duodenal mass and biopsies confirmed carcinoma. PET scan on April 6 showed a hypermetabolic mass circumferentially narrowing second portion of the duodenum. There was adjacent hypermetabolic lymph nodes near the duodenum and no mass inferior to the gastric attic ligament. Patient had a gastric outlet obstruction due to a duodenal mass and underwent heliotrope gastric jejunostomy on March 17. He has a history of anemia from chronic GI bleeding. Patient has had ongoing intermittent vomiting her daughters since he returned home from his surgery. Patient had hematemesis last night but has not vomited since then. Hemoglobin currently stable after 2 units of packed red blood cells. Will review patient with Dr. Hilarie Fredrickson as to possible EGD later today to assess if there is variceal bleeding. We'll also review if any further imaging may be of benefit to assess if there is recurrent obstruction.    Diogo Anne, [name] 04/14/2015,  Pager [phone]

## 2015-04-14 NOTE — Progress Notes (Signed)
[facility] PROGRESS NOTE  James [name] ION:[id number] DOB: January 12, 1928 [name]: 04/12/2015 PCP: [name], MD  Assessment/Plan: 1. Hematemesis- patient started vomiting coffee-ground emesis and then bright red blood yesterday evening. Transferred to stepdown unit started on octreotide infusion, Protonix infusion, and GI consultation. Also received 2 units of PRBC. Patient is stable at this time. Gastroenterology will follow the patient today to decide whether EGD should be performed for variceal bleed. EGD from March revealed esophageal varices. Patient has history of duodenal mass and has had chronic GI bleed. Also discussed with patient's oncologist Dr. Benay Spice 2. Anemia- likely combination of chronic GI bleed as well as anemia of chronic disease due to the cancer. Will continue to monitor hemoglobin and transfuse blood as needed. 3. Acute kidney injury- patient's BUN/creatinine is little better this morning. Lasix is currently on hold, will continue to hydrate as patient was dehydrated when he came to the hospital 4. Hypothyroidism-continue Synthroid 5. Chronic diastolic CHF- currently compensated, Lasix on hold due to soft blood pressure. 6. CAD status post stenting- patient currently on aspirin and statin 7. Cancer status post recent surgery on chemotherapy- followed by oncology Dr. Benay Spice.  Code Status: Full code Family Communication: Discussed with patient's daughters at bedside Disposition Plan: To be decided, likely home when medically stable   Consultants:  Gastroenterology  Oncology  Procedures:  None  Antibiotics:  None  HPI/Subjective: 81 y.o. 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 Patient was brought to the ER later and CT head did not show anything acute. Ammonia level is minimally  elevated. Patient's hemoglobin shows decline from his base of ear. Per the family patient did not have any nausea vomiting abdominal pain diarrhea or chest pain or shortness of breath. As per the family patient's mentation has come back to baseline.   Last night patient started vomiting bright red blood. Patient was transferred to stepdown unit, started on octreotide drip, Protonix infusion. GI was consulted. Patient received 2 units PRBC Review of CBC reveals stable hemoglobin this morning 8.9  Patient is more alert, no respiratory distress.   [name] ctiveDanley Danker Vitals:   04/14/15 1000  BP: 118/36  Pulse: 53  Temp:   Resp: 19    Intake/Output Summary (Last 24 hours) at 04/14/15 1109 Last data filed at 04/14/15 1000  Gross per 24 hour  Intake 2167.49 ml  Output    200 ml  Net 1967.49 ml   Filed Weights   04/12/15 2152  Weight: 93.305 kg (205 lb 11.2 oz)    Exam:   GeneralLethargic but comfortable  Cardiovascular: S1-S2 regular, no murmurs rubs or gallops  Respiratory: Clear to auscultation bilaterally   AbdomenSoft, nontender, no organomegaly  Musculoskeletal: *No edema of the lower extremities noted.   Data Reviewed: Basic Metabolic Panel:  Recent Labs Lab 04/11/15 1249 04/12/15 1816 04/13/15 0530 04/14/15 0645  NA 135* 135 137 138  K 4.2 4.6 4.7 4.0  CL  --  101 105 109  CO2 17* 22 21 21   GLUCOSE 210* 146* 151* 152*  BUN 37.3* 55* 63* 56*  CREATININE 1.5* 1.84* 1.79* 1.72*  CALCIUM 9.0 8.9 9.0 8.4   Liver Function Tests:  Recent Labs Lab 04/11/15 1249 04/12/15 1816 04/13/15 0530  AST 40* 53* 49*  ALT 76* 78* 93*  ALKPHOS 501* 382* 357*  BILITOT 1.30* 1.3* 1.1  PROT 6.6  6.5 6.3  ALBUMIN 3.0* 3.0* 3.1*   No results for input(s): LIPASE, AMYLASE in the last 168 hours.  Recent Labs Lab 04/12/15 1816 04/13/15 0655  AMMONIA 50* 127*   CBC:  Recent Labs Lab 04/11/15 1249 04/12/15 1816 04/13/15 0530 04/13/15 1905 04/14/15 0645  WBC  16.3* 19.7* 16.6*  --  9.2  NEUTROABS 14.1* 17.1* 14.4*  --   --   HGB 8.3* 7.7* 8.6* 8.2* 8.9*  HCT 27.1* 25.1* 27.5* 26.5* 27.6*  MCV 91.6 90.6 90.2  --  88.7  PLT 514* 475* 438*  --  301   Cardiac Enzymes:  Recent Labs Lab 04/13/15 0530  CKTOTAL 23  TROPONINI 0.04*   BNP (last 3 results) No results for input(s): BNP in the last 8760 hours.  ProBNP (last 3 results)  Recent Labs  06/06/14 1244 11/29/14 0944 03/22/15 1513  PROBNP 83.0 114.6 71.0    CBG:  Recent Labs Lab 04/13/15 1944  GLUCAP 162*    Recent Results (from the past 240 hour(s))  Urine culture     Status: None   Collection Time: 04/12/15  5:24 PM  Result Value Ref Range Status   Specimen Description URINE, CLEAN CATCH  Final   Special Requests NONE  Final   Colony Count NO GROWTH Performed at [facility]   Final   Culture NO GROWTH Performed at [facility]   Final   Report Status 04/14/2015 FINAL  Final  Culture, blood (routine x 2)     Status: None (Preliminary result)   Collection Time: 04/12/15  9:20 PM  Result Value Ref Range Status   Specimen Description BLOOD LEFT HAND  Final   Special Requests BOTTLES DRAWN AEROBIC ONLY 2CC  Final   Culture   Final           BLOOD CULTURE RECEIVED NO GROWTH TO DATE CULTURE WILL BE HELD FOR 5 DAYS BEFORE ISSUING A FINAL NEGATIVE REPORT Performed at [facility]    Report Status PENDING  Incomplete  Culture, blood (routine x 2)     Status: None (Preliminary result)   Collection Time: 04/12/15 10:17 PM  Result Value Ref Range Status   Specimen Description BLOOD LEFT FOREARM  Final   Special Requests BOTTLES DRAWN AEROBIC ONLY 3CC  Final   Culture   Final           BLOOD CULTURE RECEIVED NO GROWTH TO DATE CULTURE WILL BE HELD FOR 5 DAYS BEFORE ISSUING A FINAL NEGATIVE REPORT Performed at [facility]    Report Status PENDING  Incomplete  MRSA PCR Screening     Status: None   Collection Time: 04/13/15  8:00 PM   Result Value Ref Range Status   MRSA by PCR NEGATIVE NEGATIVE Final    Comment:        The GeneXpert MRSA Assay (FDA approved for NASAL specimens only), is one component of a comprehensive MRSA colonization surveillance program. It is not intended to diagnose MRSA infection nor to guide or monitor treatment for MRSA infections.      Studies: Ct Head Wo Contrast  04/12/2015   CLINICAL DATA:  82 year old male with altered mental status. Medical history includes gastric cancer metastatic to the liver and ongoing chemotherapy.  EXAM: CT HEAD WITHOUT CONTRAST  TECHNIQUE: Contiguous axial images were obtained from the base of the skull through the vertex without intravenous contrast.  COMPARISON:  Prior PET-CT 03/29/2015; prior brain MRI 01/06/2013; prior head CT 01/16/2011  FINDINGS: Negative for acute intracranial hemorrhage, acute infarction, mass, mass effect, hydrocephalus or midline shift. Gray-white differentiation is preserved throughout. Cerebral and cerebellar cortical volume loss. Mild chronic microvascular ischemic white matter disease. No focal soft tissue or calvarial abnormality. Symmetric globes bilaterally. Normal aeration of the mastoid air cells paranasal sinuses. Mild hyperostosis frontalis interna. Intracranial atherosclerosis.  IMPRESSION: No acute intracranial abnormality.  Stable atrophy and mild chronic microvascular ischemic white matter disease.   Electronically Signed   By: Jacqulynn Cadet M.D.   On: 04/12/2015 20:11   Dg Chest Port 1 View  04/12/2015   CLINICAL DATA:  81 year old male with 1 day history of weakness and lethargy. Current clinical history includes gastric cancer metastatic to the liver. Patient received chemotherapy yesterday.  EXAM: PORTABLE CHEST - 1 VIEW  COMPARISON:  Prior chest x-ray 04/05/2015  FINDINGS: Single-lumen power injectable port catheter via a right internal jugular venous approach. Catheter tip projects over the mid SVC. Mild  cardiomegaly. Atherosclerotic calcifications present in the aorta. No focal airspace consolidation, pleural effusion or pneumothorax. No evidence of pulmonary edema. Inspiratory volumes are slightly low with mild bibasilar atelectasis. No acute osseous abnormality.  IMPRESSION: Low inspiratory volumes with minimal bibasilar atelectasis.  No acute cardiopulmonary process.   Electronically Signed   By: Jacqulynn Cadet M.D.   On: 04/12/2015 18:31    Scheduled Meds: . acetaminophen  650 mg Oral Once  . diphenhydrAMINE  25 mg Oral Once  . feeding supplement (ENSURE ENLIVE)  237 mL Oral TID BM  . finasteride  5 mg Oral Daily  . iron polysaccharides  150 mg Oral BID  . lactulose  10 g Oral BID  . multivitamin with minerals  1 tablet Oral Daily  . [START ON 04/17/2015] pantoprazole (PROTONIX) IV  40 mg Intravenous Q12H  . pravastatin  20 mg Oral q1800  . predniSONE  5 mg Oral Q breakfast  . psyllium  1 packet Oral Daily  . sodium chloride  3 mL Intravenous Q12H   Continuous Infusions: . dextrose 5 % and 0.9% NaCl 50 mL/hr at 04/14/15 0138  . octreotide  (SANDOSTATIN)    IV infusion 50 mcg/hr (04/14/15 0644)  . pantoprozole (PROTONIX) infusion 8 mg/hr (04/13/15 2112)    Principal Problem:   Weakness Active Problems:   HYPERTENSION, BENIGN   CAD S/P LAD DES after FFR 06/22/14   CHF (congestive heart failure)   Hypothyroidism   Altered mental status   Normocytic anemia    Time spent: 25 min    [facility]  [facility] Pager [phone]*. If 7PM-7AM, please contact night-coverage at [email or web address], password [facility] 04/14/2015, 11:09 AM  LOS: 2 days

## 2015-04-15 DIAGNOSIS — E039 Hypothyroidism, unspecified: Secondary | ICD-10-CM

## 2015-04-15 DIAGNOSIS — C170 Malignant neoplasm of duodenum: Secondary | ICD-10-CM | POA: Insufficient documentation

## 2015-04-15 LAB — HEMOGLOBIN AND HEMATOCRIT, BLOOD
HCT: 26.4 % — ABNORMAL LOW (ref 39.0–52.0)
HCT: 26 % — ABNORMAL LOW (ref 39.0–52.0)
HEMATOCRIT: 24.9 % — AB (ref 39.0–52.0)
HEMOGLOBIN: 8.3 g/dL — AB (ref 13.0–17.0)
HEMOGLOBIN: 8.1 g/dL — AB (ref 13.0–17.0)
HEMOGLOBIN: 7.7 g/dL — AB (ref 13.0–17.0)

## 2015-04-15 MED ORDER — SODIUM CHLORIDE 0.9 % IV BOLUS (SEPSIS)
250.0000 mL | Freq: Once | INTRAVENOUS | Status: AC
  Administered 2015-04-15: 250 mL via INTRAVENOUS

## 2015-04-15 NOTE — Progress Notes (Signed)
[facility] PROGRESS NOTE  James [name] LEX:[id number] DOB: [birth year] [name]: 04/12/2015 PCP: [name], MD  Assessment/Plan: 1. Hematemesis- patient started vomiting coffee-ground emesis and then bright red blood . Transferred to stepdown unit started on octreotide infusion, Protonix infusion, and GI consultation. Also received 2 units of PRBC. Patient is stable at this time. Gastroenterology will followed the patient and he underwent upper GI endoscopy which showed duodenal cancer with ulcer. Also had small to medium-sized esophageal varices which were not bleeding. Octreotide infusion has been discontinued and patient started on Protonix 40 g IV every 12 hours. We'll continue to monitor hemoglobin and transfuse as needed. 2. Anemia- likely combination of chronic GI bleed as well as anemia of chronic disease due to the cancer. Will continue to monitor hemoglobin and transfuse blood as needed. 3. Acute kidney injury- patient's BUN/creatinine is little better this morning. Lasix is currently on hold, will continue to hydrate as patient was dehydrated when he came to the hospital. 4. Hypothyroidism-continue Synthroid 5. Chronic diastolic CHF- currently compensated, Lasix on hold due to soft blood pressure. 6. CAD status post stenting- stable 7. Cancer status post recent surgery on chemotherapy- followed by oncology Dr. Benay Spice.  Code Status: Full code Family Communication: Discussed with patient's daughters at bedside Disposition Plan: To be decided, likely home when medically stable   Consultants:  Gastroenterology  Oncology  Procedures:  None  Antibiotics:  None  HPI/Subjective: 81 y.o. 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 Patient was brought to the ER later and CT head did not show anything acute.  Ammonia level is minimally elevated. Patient's hemoglobin shows decline from his base of ear. Per the family patient did not have any nausea vomiting abdominal pain diarrhea or chest pain or shortness of breath. As per the family patient's mentation has come back to baseline.  patient started vomiting bright red blood. Patient was transferred to stepdown unit, started on octreotide drip, Protonix infusion. GI was consulted. Patient received 2 units PRBC  Patient is status post upper GI endoscopy, which showed duodenal cancer with overlying ulcer.  He feels better this morning, daughter said that patient is back to his normal self  Obje ctive: Filed Vitals:   04/15/15 0904  BP:   Pulse:   Temp: 97.3 F (36.3 C)  Resp:     Intake/Output Summary (Last 24 hours) at 04/15/15 1025 Last data filed at 04/15/15 0800  Gross per 24 hour  Intake 2713.08 ml  Output   1645 ml  Net 1068.08 ml   Filed Weights   04/12/15 2152  Weight: 93.305 kg (205 lb 11.2 oz)    Exam:   General appears in no acute distress  Cardiovascular: S1-S2 regular, no murmurs rubs or gallops  Respiratory: Clear to auscultation bilaterally   AbdomenSoft, nontender, no organomegaly  Musculoskeletal: *No edema of the lower extremities noted.   Data Reviewed: Basic Metabolic Panel:  Recent Labs Lab 04/11/15 1249 04/12/15 1816 04/13/15 0530 04/14/15 0645  NA 135* 135 137 138  K 4.2 4.6 4.7 4.0  CL  --  101 105 109  CO2 17* 22 21 21   GLUCOSE 210* 146* 151* 152*  BUN 37.3* 55* 63* 56*  CREATININE 1.5* 1.84* 1.79* 1.72*  CALCIUM 9.0 8.9 9.0 8.4   Liver Function Tests:  Recent Labs Lab 04/11/15 1249 04/12/15 1816 04/13/15 0530  AST  40* 53* 49*  ALT 76* 78* 93*  ALKPHOS 501* 382* 357*  BILITOT 1.30* 1.3* 1.1  PROT 6.6 6.5 6.3  ALBUMIN 3.0* 3.0* 3.1*   No results for input(s): LIPASE, AMYLASE in the last 168 hours.  Recent Labs Lab 04/12/15 1816 04/13/15 0655 04/14/15 2153  AMMONIA 50* 127*  91*   CBC:  Recent Labs Lab 04/11/15 1249  04/12/15 1816 04/13/15 0530 04/13/15 1905 04/14/15 0645 04/14/15 1239 04/14/15 2154 04/15/15 0242  WBC 16.3*  --  19.7* 16.6*  --  9.2  --   --   --   NEUTROABS 14.1*  --  17.1* 14.4*  --   --   --   --   --   HGB 8.3*  < > 7.7* 8.6* 8.2* 8.9* 9.0* 9.6* 8.3*  HCT 27.1*  < > 25.1* 27.5* 26.5* 27.6* 28.1* 30.7* 26.4*  MCV 91.6  --  90.6 90.2  --  88.7  --   --   --   PLT 514*  --  475* 438*  --  301  --   --   --   < > = values in this interval not displayed. Cardiac Enzymes:  Recent Labs Lab 04/13/15 0530  CKTOTAL 23  TROPONINI 0.04*   BNP (last 3 results) No results for input(s): BNP in the last 8760 hours.  ProBNP (last 3 results)  Recent Labs  06/06/14 1244 11/29/14 0944 03/22/15 1513  PROBNP 83.0 114.6 71.0    CBG:  Recent Labs Lab 04/13/15 1944  GLUCAP 162*    Recent Results (from the past 240 hour(s))  Urine culture     Status: None   Collection Time: 04/12/15  5:24 PM  Result Value Ref Range Status   Specimen Description URINE, CLEAN CATCH  Final   Special Requests NONE  Final   Colony Count NO GROWTH Performed at [facility]   Final   Culture NO GROWTH Performed at [facility]   Final   Report Status 04/14/2015 FINAL  Final  Culture, blood (routine x 2)     Status: None (Preliminary result)   Collection Time: 04/12/15  9:20 PM  Result Value Ref Range Status   Specimen Description BLOOD LEFT HAND  Final   Special Requests BOTTLES DRAWN AEROBIC ONLY 2CC  Final   Culture   Final           BLOOD CULTURE RECEIVED NO GROWTH TO DATE CULTURE WILL BE HELD FOR 5 DAYS BEFORE ISSUING A FINAL NEGATIVE REPORT Performed at [facility]    Report Status PENDING  Incomplete  Culture, blood (routine x 2)     Status: None (Preliminary result)   Collection Time: 04/12/15 10:17 PM  Result Value Ref Range Status   Specimen Description BLOOD LEFT FOREARM  Final   Special Requests BOTTLES  DRAWN AEROBIC ONLY 3CC  Final   Culture   Final           BLOOD CULTURE RECEIVED NO GROWTH TO DATE CULTURE WILL BE HELD FOR 5 DAYS BEFORE ISSUING A FINAL NEGATIVE REPORT Performed at [facility]    Report Status PENDING  Incomplete  MRSA PCR Screening     Status: None   Collection Time: 04/13/15  8:00 PM  Result Value Ref Range Status   MRSA by PCR NEGATIVE NEGATIVE Final    Comment:        The GeneXpert MRSA Assay (FDA approved for NASAL specimens only), is one component  of a comprehensive MRSA colonization surveillance program. It is not intended to diagnose MRSA infection nor to guide or monitor treatment for MRSA infections.      Studies: No results found.  Scheduled Meds: . acetaminophen  650 mg Oral Once  . diphenhydrAMINE  25 mg Oral Once  . feeding supplement (ENSURE ENLIVE)  237 mL Oral TID BM  . finasteride  5 mg Oral Daily  . iron polysaccharides  150 mg Oral BID  . lactulose  10 g Oral BID  . multivitamin with minerals  1 tablet Oral Daily  . [START ON 04/17/2015] pantoprazole (PROTONIX) IV  40 mg Intravenous Q12H  . pravastatin  20 mg Oral q1800  . predniSONE  5 mg Oral Q breakfast  . psyllium  1 packet Oral Daily  . sodium chloride  3 mL Intravenous Q12H   Continuous Infusions: . sodium chloride 20 mL/hr at 04/14/15 2047  . dextrose 5 % and 0.9% NaCl 50 mL/hr at 04/14/15 2050  . diltiazem (CARDIZEM) infusion Stopped (04/15/15 0133)  . norepinephrine (LEVOPHED) Adult infusion Stopped (04/14/15 1600)  . octreotide  (SANDOSTATIN)    IV infusion Stopped (04/14/15 1300)  . pantoprozole (PROTONIX) infusion 8 mg/hr (04/15/15 0092)    Principal Problem:   Weakness Active Problems:   HYPERTENSION, BENIGN   CAD S/P LAD DES after FFR 06/22/14   CHF (congestive heart failure)   Hypothyroidism   Altered mental status   Normocytic anemia   Hematemesis   Duodenal cancer    Time spent: 25 min    [facility]  [facility] Pager [phone]*.  If 7PM-7AM, please contact night-coverage at [email or web address], password [facility] 04/15/2015, 10:25 AM  LOS: 3 days

## 2015-04-15 NOTE — Progress Notes (Signed)
Spoke with MD about patient's low blood pressure, following two 250 mL boluses, and the patient's decreasing heart rate.  Heart rate is currently in the low 50s. MD instructed RN to monitor the patient and to contact MD if HR dropped to the 30s. Will continue to monitor.

## 2015-04-15 NOTE — Progress Notes (Signed)
    Progress Note   Subjective  Pt with rigors this morning and feeling cold No fever as of yet Ate liquids this am without issues No abd pain   Objective   Vital signs in last 24 hours: Temp:  [97.3 F (36.3 C)-98.2 F (36.8 C)] 97.6 F (36.4 C) (04/23 1200) Pulse Rate:  [26-102] 68 (04/23 0900) Resp:  [11-31] 22 (04/23 1400) BP: (89-135)/(31-83) 114/74 mmHg (04/23 1200) SpO2:  [84 %-100 %] 100 % (04/23 1300) Last BM Date: 04/14/15 Gen: awake, alert, NAD HEENT: anicteric, op clear CV: RRR, no mrg Pulm: CTA b/l Abd: soft, NT/ND, +BS throughout Ext: no c/c Neuro: nonfocal   Intake/Output from previous day: 04/22 0701 - 04/23 0700 In: 2918.1 [I.V.:2362.1; IV Piggyback:556] Out: 8891 [Urine:1645] Intake/Output this shift: Total I/O In: 665 [I.V.:665] Out: -   Lab Results:  Recent Labs  04/12/15 1816 04/13/15 0530  04/14/15 0645  04/14/15 2154 04/15/15 0242 04/15/15 1050  WBC 19.7* 16.6*  --  9.2  --   --   --   --   HGB 7.7* 8.6*  < > 8.9*  < > 9.6* 8.3* 8.1*  HCT 25.1* 27.5*  < > 27.6*  < > 30.7* 26.4* 26.0*  PLT 475* 438*  --  301  --   --   --   --   < > = values in this interval not displayed. BMET  Recent Labs  04/12/15 1816 04/13/15 0530 04/14/15 0645  NA 135 137 138  K 4.6 4.7 4.0  CL 101 105 109  CO2 22 21 21   GLUCOSE 146* 151* 152*  BUN 55* 63* 56*  CREATININE 1.84* 1.79* 1.72*  CALCIUM 8.9 9.0 8.4   LFT  Recent Labs  04/13/15 0530  PROT 6.3  ALBUMIN 3.1*  AST 49*  ALT 93*  ALKPHOS 357*  BILITOT 1.1     Assessment / Plan:   81 year old with duodenal cancer with liver metastasis developed gastric outlet obstruction status post palliative gastrojejunostomy March 2016, history of portal vein thrombosis and portal hypertension, recurrent loss anemia, history of left leg DVT, CAD status post PCI, history of noninvasive bladder cancer February 2016, drug related ITP admitted with altered mental status and hematemesis  1.  Hematemesis/duodenal cancer -- EGD yesterday proved bleeding duodenal mass with ulceration. No endoscopic therapy for this lesion. Hemoglobin stable today. If hemodynamically significant rebleeding would consult IR for possible embolization of the gastroduodenal artery. Monitor hemoglobin, transfuse as necessary --Esophageal varices small to medium, not bleeding --Gastrojejunostomy healthy-appearing and patent --Dr. Benay Spice to return Monday to make decisions regarding resuming chemotherapy  2. Anemia -- see #1, transfuse as necessary  3. Rigors -- unclear etiology, no fever yet. Monitor closely and if febrile then blood cultures  GI will be available, call with questions  Principal Problem:   Weakness Active Problems:   HYPERTENSION, BENIGN   CAD S/P LAD DES after FFR 06/22/14   CHF (congestive heart failure)   Hypothyroidism   Altered mental status   Normocytic anemia   Hematemesis   Duodenal cancer     LOS: 3 days   Cia Garretson M  04/15/2015, 2:22 PM

## 2015-04-15 NOTE — Op Note (Signed)
[place], [id number]   ENDOSCOPY PROCEDURE REPORT  PATIENT: James [name], James [name]  MR#: [id number] BIRTHDATE: 26-May-1928 , 86  yrs. old GENDER: male ENDOSCOPIST: Jerene Bears, MD REFERRED BY:  [facility] PROCEDURE DATE:  04/14/2015 PROCEDURE:  EGD, diagnostic ASA CLASS:     Class III INDICATIONS:  hematemesis and known duodenal cancer. MEDICATIONS: Fentanyl 25 mcg IV and Versed 4 mg IV TOPICAL ANESTHETIC: Cetacaine Spray  DESCRIPTION OF PROCEDURE: After the risks benefits and alternatives of the procedure were thoroughly explained, informed consent was obtained.  The Pentax Gastroscope [id number] endoscope was introduced through the mouth and advanced to the second portion of the duodenum , Without limitations.  The instrument was slowly withdrawn as the mucosa was fully examined.   ESOPHAGUS: There were 4 columns of small to medium-sized varices in the middle third of the esophagus and lower third esophagus.  no active bleeding or stigmata of recent bleeding  STOMACH: A gastroenterostomy was found in the gastric body characterized as healthy in appearance.   No active bleeding from the stomach.  JEJUNUM: The exam showed no abnormalities in the examined  jejunal limb.  DUODENUM: A circumferential ulcerated mass was found beginning just past the duodenal bulb.  This malignant mass has been previously seen. There was adherent clot. This large cancer is not amenable to endoscopic therapy.  Retroflexed views revealed a hiatal hernia Without gastric varices. The scope was then withdrawn from the patient and the procedure completed.  COMPLICATIONS: There were no immediate complications.  ENDOSCOPIC IMPRESSION: 1.  Small to medium-sized, nonbleeding, esophageal varices 2.  Healthy and patent gastroenterostomy was found in the gastric body 3.  Otherwise normal stomach 4.  Normal examined efferent jejunal limb 5.  Duodenal cancer with  ulceration and adherent clot which is source of recent hematemesis/bleeding.  This lesion is not amenable to endoscopic therapy   RECOMMENDATIONS: Supportive care.  Can stop octreotide infusion.  BID PPI.  Plan per oncology.  Transfusion as necessary to support Hgb as tumor bleeding not amenable to endoscopic therapy.  If brisk bleeding, then could consider IR for embolization of the gastroduodenal artery.   eSigned:  Jerene Bears, MD 04/15/2015 8:24 AM  CC: the patient  PATIENT NAME:  James [name], James [name] MR#: [id number]

## 2015-04-15 NOTE — Evaluation (Signed)
Physical Therapy Evaluation Patient Details Name: James [name] MRN: [id number] DOB: 03-24-1928 Today's Date: 04/15/2015   History of Present Illness  81 yo male admitted with weakness, confusion. 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   Clinical Impression  On eval, pt was Min guard-Min assist for mobility-able to ambulate ~800 feet while holding onto IV pole/vs no external support. Pt tolerated activity well. Slightly unsteady at times. Pt plans to d/c home-declines HHPT. Encouraged pt to ambulate daily with nursing supervision.     Follow Up Recommendations Supervision/Assistance - 24 hour-initially (pt declines HHPT)    Equipment Recommendations  None recommended by PT    Recommendations for Other Services       Precautions / Restrictions Precautions Precautions: Fall Restrictions Weight Bearing Restrictions: No      Mobility  Bed Mobility Overal bed mobility: Needs Assistance Bed Mobility: Supine to Sit;Sit to Supine     Supine to sit: Supervision Sit to supine: Supervision   General bed mobility comments: supervision for lines  Transfers Overall transfer level: Needs assistance   Transfers: Sit to/from Stand Sit to Stand: Min guard         General transfer comment: close guard for safety  Ambulation/Gait Ambulation/Gait assistance: Min assist Ambulation Distance (Feet): 800 Feet Assistive device: None (IV pole) Gait Pattern/deviations: Step-through pattern;Staggering left;Staggering right     General Gait Details: intermittent staggering especially with changes in direction/turns. tolerated distance well.   Stairs            Wheelchair Mobility    Modified Rankin (Stroke Patients Only)       Balance                                             Pertinent Vitals/Pain Pain Assessment: No/denies pain    Home Living Family/patient expects to be discharged to:: Private  residence Living Arrangements: Children Available Help at Discharge: Family Type of Home: House Home Access: Level entry     Home Layout: Able to live on main level with bedroom/bathroom;Two level Home Equipment: Cane - single point;Walker - 2 wheels      Prior Function Level of Independence: Independent               Hand Dominance        Extremity/Trunk Assessment   Upper Extremity Assessment: Overall WFL for tasks assessed           Lower Extremity Assessment: Generalized weakness      Cervical / Trunk Assessment: Normal  Communication   Communication: No difficulties  Cognition Arousal/Alertness: Awake/alert Behavior During Therapy: WFL for tasks assessed/performed Overall Cognitive Status: Within Functional Limits for tasks assessed                      General Comments      Exercises        Assessment/Plan    PT Assessment Patient needs continued PT services  PT Diagnosis Difficulty walking   PT Problem List Decreased balance;Decreased mobility  PT Treatment Interventions DME instruction;Gait training;Functional mobility training;Therapeutic activities;Therapeutic exercise;Patient/family education;Balance training   PT Goals (Current goals can be found in the Care Plan section) Acute Rehab PT Goals Patient Stated Goal: home soon PT Goal Formulation: With patient/family Time For Goal Achievement: 04/29/15 Potential to Achieve Goals: Good    Frequency Min 3X/week  Barriers to discharge        Co-evaluation               End of Session Equipment Utilized During Treatment: Gait belt Activity Tolerance: Patient tolerated treatment well Patient left: in bed;with call bell/phone within reach;with family/visitor present           Time: 1347-1416 PT Time Calculation (min) (ACUTE ONLY): 29 min   Charges:   PT Evaluation $Initial PT Evaluation Tier I: 1 Procedure PT Treatments $Gait Training: 8-22 mins   PT G  Codes:        Weston Anna, MPT Pager: [phone]

## 2015-04-16 LAB — COMPREHENSIVE METABOLIC PANEL
ALBUMIN: 2.7 g/dL — AB (ref 3.5–5.2)
ALT: 80 U/L — ABNORMAL HIGH (ref 0–53)
AST: 34 U/L (ref 0–37)
Alkaline Phosphatase: 212 U/L — ABNORMAL HIGH (ref 39–117)
Anion gap: 6 (ref 5–15)
BUN: 23 mg/dL (ref 6–23)
CO2: 16 mmol/L — ABNORMAL LOW (ref 19–32)
Calcium: 7.8 mg/dL — ABNORMAL LOW (ref 8.4–10.5)
Chloride: 111 mmol/L (ref 96–112)
Creatinine, Ser: 1.07 mg/dL (ref 0.50–1.35)
GFR, EST AFRICAN AMERICAN: 71 mL/min — AB (ref >90)
GFR, EST NON AFRICAN AMERICAN: 62 mL/min — AB (ref >90)
Glucose, Bld: 160 mg/dL — ABNORMAL HIGH (ref 70–99)
Potassium: 3.7 mmol/L (ref 3.5–5.1)
Sodium: 133 mmol/L — ABNORMAL LOW (ref 135–145)
TOTAL PROTEIN: 5.6 g/dL — AB (ref 6.0–8.3)
Total Bilirubin: 1.3 mg/dL — ABNORMAL HIGH (ref 0.3–1.2)

## 2015-04-16 LAB — TYPE AND SCREEN
ABO/RH(D): A NEG
Antibody Screen: NEGATIVE
UNIT DIVISION: 0
UNIT DIVISION: 0
Unit division: 0
Unit division: 0
Unit division: 0

## 2015-04-16 LAB — CBC WITH DIFFERENTIAL/PLATELET
Basophils Absolute: 0 10*3/uL (ref 0.0–0.1)
Basophils Relative: 0 % (ref 0–1)
Eosinophils Absolute: 0 10*3/uL (ref 0.0–0.7)
Eosinophils Relative: 0 % (ref 0–5)
HEMATOCRIT: 34.2 % — AB (ref 39.0–52.0)
Hemoglobin: 10.6 g/dL — ABNORMAL LOW (ref 13.0–17.0)
LYMPHS ABS: 0.5 10*3/uL — AB (ref 0.7–4.0)
Lymphocytes Relative: 5 % — ABNORMAL LOW (ref 12–46)
MCH: 28.1 pg (ref 26.0–34.0)
MCHC: 31 g/dL (ref 30.0–36.0)
MCV: 90.7 fL (ref 78.0–100.0)
Monocytes Absolute: 0.3 10*3/uL (ref 0.1–1.0)
Monocytes Relative: 3 % (ref 3–12)
Neutro Abs: 8.8 10*3/uL — ABNORMAL HIGH (ref 1.7–7.7)
Neutrophils Relative %: 92 % — ABNORMAL HIGH (ref 43–77)
Platelets: 182 10*3/uL (ref 150–400)
RBC: 3.77 MIL/uL — AB (ref 4.22–5.81)
RDW: 18.3 % — ABNORMAL HIGH (ref 11.5–15.5)
WBC: 9.6 10*3/uL (ref 4.0–10.5)

## 2015-04-16 LAB — AMMONIA: Ammonia: 53 umol/L — ABNORMAL HIGH (ref 11–32)

## 2015-04-16 LAB — HEMOGLOBIN AND HEMATOCRIT, BLOOD
HCT: 25.5 % — ABNORMAL LOW (ref 39.0–52.0)
Hemoglobin: 8 g/dL — ABNORMAL LOW (ref 13.0–17.0)

## 2015-04-16 MED ORDER — FUROSEMIDE 20 MG PO TABS
20.0000 mg | ORAL_TABLET | Freq: Every day | ORAL | Status: DC
  Administered 2015-04-16 – 2015-04-17 (×2): 20 mg via ORAL
  Filled 2015-04-16 (×2): qty 1

## 2015-04-16 MED ORDER — PANTOPRAZOLE SODIUM 40 MG IV SOLR
40.0000 mg | Freq: Two times a day (BID) | INTRAVENOUS | Status: DC
  Administered 2015-04-16 – 2015-04-17 (×2): 40 mg via INTRAVENOUS
  Filled 2015-04-16 (×2): qty 40

## 2015-04-16 NOTE — Progress Notes (Signed)
CARE MANAGEMENT NOTE 04/16/2015  Patient:  James [name], James [name]   Account Number:  [id number]  Date Initiated:  04/16/2015  Documentation initiated by:  DAVIS,RHONDA  Subjective/Objective Assessment:   patient with multiplke health concerns, c=hx of ca of gi tract now with bleeding of varcies and hx of ssc     Action/Plan:   tbd   Anticipated DC Date:  04/19/2015   Anticipated DC Plan:    In-house referral  NA      DC Planning Services  CM consult      PAC Choice  NA   Choice offered to / List presented to:  NA           Status of service:  In process, will continue to follow Medicare Important Message given?   (If response is "NO", the following Medicare IM given date fields will be blank) Date Medicare IM given:   Medicare IM given by:   Date Additional Medicare IM given:   Additional Medicare IM given by:    Discharge Disposition:    Per UR Regulation:  Reviewed for med. necessity/level of care/duration of stay  If discussed at [place] of Stay Meetings, dates discussed:    Comments:  April 16, 2015/Rhonda L. Rosana Hoes, RN, BSN, CCM. Case Management [place] [phone] No discharge needs present of time of review.

## 2015-04-16 NOTE — Progress Notes (Signed)
[facility] PROGRESS NOTE  James [name] ZJQ:[id number] DOB: [birth year] [name]: 04/12/2015 PCP: [name], MD  Assessment/Plan: 1. Hematemesis- patient started vomiting coffee-ground emesis and then bright red blood . Transferred to stepdown unit started on octreotide infusion, Protonix infusion, and GI consultation. Also received 2 units of PRBC.  Gastroenterology performed  upper GI endoscopy which showed duodenal cancer with ulcer. Also had small to medium-sized esophageal varices which were not bleeding. Octreotide infusion was discontinued and patient started on Protonix 40 g IV every 12 hours. Hemoglobin has been stable.No more episodes of hematemesis. 2. Anemia- likely combination of  GI bleed as well as anemia of chronic disease due to the cancer. Will continue to monitor hemoglobin and transfuse blood as needed. Today the hemoglobin is 8.0 3. Acute kidney injury- patient's BUN/creatinine is little better this morning. Lasix is currently on hold. 4. Hypothyroidism-continue Synthroid 5. Chronic diastolic CHF- currently compensated, patient's lungs are clear and he does not have shortness of breath despite getting blood transfusion. Will recheck renal function today and restart the lasix at low dose. 6. CAD status post stenting- stable 7. Cancer status post recent surgery on chemotherapy- followed by oncology Dr. Benay Spice.  Code Status: Full code Family Communication: Discussed with patient's daughters at bedside Disposition Plan: home when medically stable in next 24- 48 hrs  Consultants:  Gastroenterology  Oncology  Procedures:  None  Antibiotics:  None  HPI/Subjective: 81 y.o. 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 Patient was brought to the ER later and CT head did not show anything acute. Ammonia  level is minimally elevated. Patient's hemoglobin shows decline from his base of ear. Per the family patient did not have any nausea vomiting abdominal pain diarrhea or chest pain or shortness of breath. As per the family patient's mentation has come back to baseline.  patient started vomiting bright red blood. Patient was transferred to stepdown unit, started on octreotide drip, Protonix infusion. GI was consulted. Patient received 2 units PRBC Patient is status post upper GI endoscopy, which showed duodenal cancer with overlying ulcer.   Patient had an episode of shaking chills yesterday, which resolved. No more episodes last night. He has been afebrile, good po intake. Complains of hemorrhoidal pain, no bleeding.  Obje ctiveDanley Danker Vitals:   04/16/15 0826  BP:   Pulse:   Temp: 97.1 F (36.2 C)  Resp:     Intake/Output Summary (Last 24 hours) at 04/16/15 0953 Last data filed at 04/16/15 0600  Gross per 24 hour  Intake   2098 ml  Output    850 ml  Net   1248 ml   Filed Weights   04/12/15 2152 04/16/15 0059  Weight: 93.305 kg (205 lb 11.2 oz) 97.2 kg (214 lb 4.6 oz)    Exam:   General appears in no acute distress  Cardiovascular: S1-S2 regular, no murmurs rubs or gallops  Respiratory: Clear to auscultation bilaterally   AbdomenSoft, nontender, no organomegaly  Musculoskeletal: *No edema of the lower extremities noted.   Neuro AO x3, no focal defecit  Data Reviewed: Basic Metabolic Panel:  Recent Labs Lab 04/11/15 1249 04/12/15 1816 04/13/15 0530 04/14/15 0645  NA 135* 135 137 138  K 4.2 4.6 4.7 4.0  CL  --  101 105 109  CO2 17* 22 21 21   GLUCOSE 210* 146* 151* 152*  BUN 37.3*  55* 63* 56*  CREATININE 1.5* 1.84* 1.79* 1.72*  CALCIUM 9.0 8.9 9.0 8.4   Liver Function Tests:  Recent Labs Lab 04/11/15 1249 04/12/15 1816 04/13/15 0530  AST 40* 53* 49*  ALT 76* 78* 93*  ALKPHOS 501* 382* 357*  BILITOT 1.30* 1.3* 1.1  PROT 6.6 6.5 6.3  ALBUMIN 3.0* 3.0*  3.1*   No results for input(s): LIPASE, AMYLASE in the last 168 hours.  Recent Labs Lab 04/12/15 1816 04/13/15 0655 04/14/15 2153  AMMONIA 50* 127* 91*   CBC:  Recent Labs Lab 04/11/15 1249  04/12/15 1816 04/13/15 0530  04/14/15 0645  04/14/15 2154 04/15/15 0242 04/15/15 1050 04/15/15 2108 04/16/15 0305  WBC 16.3*  --  19.7* 16.6*  --  9.2  --   --   --   --   --   --   NEUTROABS 14.1*  --  17.1* 14.4*  --   --   --   --   --   --   --   --   HGB 8.3*  < > 7.7* 8.6*  < > 8.9*  < > 9.6* 8.3* 8.1* 7.7* 8.0*  HCT 27.1*  < > 25.1* 27.5*  < > 27.6*  < > 30.7* 26.4* 26.0* 24.9* 25.5*  MCV 91.6  --  90.6 90.2  --  88.7  --   --   --   --   --   --   PLT 514*  --  475* 438*  --  301  --   --   --   --   --   --   < > = values in this interval not displayed. Cardiac Enzymes:  Recent Labs Lab 04/13/15 0530  CKTOTAL 23  TROPONINI 0.04*   BNP (last 3 results) No results for input(s): BNP in the last 8760 hours.  ProBNP (last 3 results)  Recent Labs  06/06/14 1244 11/29/14 0944 03/22/15 1513  PROBNP 83.0 114.6 71.0    CBG:  Recent Labs Lab 04/13/15 1944  GLUCAP 162*    Recent Results (from the past 240 hour(s))  Urine culture     Status: None   Collection Time: 04/12/15  5:24 PM  Result Value Ref Range Status   Specimen Description URINE, CLEAN CATCH  Final   Special Requests NONE  Final   Colony Count NO GROWTH Performed at [facility]   Final   Culture NO GROWTH Performed at [facility]   Final   Report Status 04/14/2015 FINAL  Final  Culture, blood (routine x 2)     Status: None (Preliminary result)   Collection Time: 04/12/15  9:20 PM  Result Value Ref Range Status   Specimen Description BLOOD LEFT HAND  Final   Special Requests BOTTLES DRAWN AEROBIC ONLY 2CC  Final   Culture   Final           BLOOD CULTURE RECEIVED NO GROWTH TO DATE CULTURE WILL BE HELD FOR 5 DAYS BEFORE ISSUING A FINAL NEGATIVE REPORT Performed at [facility]    Report Status PENDING  Incomplete  Culture, blood (routine x 2)     Status: None (Preliminary result)   Collection Time: 04/12/15 10:17 PM  Result Value Ref Range Status   Specimen Description BLOOD LEFT FOREARM  Final   Special Requests BOTTLES DRAWN AEROBIC ONLY 3CC  Final   Culture   Final           BLOOD CULTURE RECEIVED  NO GROWTH TO DATE CULTURE WILL BE HELD FOR 5 DAYS BEFORE ISSUING A FINAL NEGATIVE REPORT Performed at [facility]    Report Status PENDING  Incomplete  MRSA PCR Screening     Status: None   Collection Time: 04/13/15  8:00 PM  Result Value Ref Range Status   MRSA by PCR NEGATIVE NEGATIVE Final    Comment:        The GeneXpert MRSA Assay (FDA approved for NASAL specimens only), is one component of a comprehensive MRSA colonization surveillance program. It is not intended to diagnose MRSA infection nor to guide or monitor treatment for MRSA infections.      Studies: No results found.  Scheduled Meds: . acetaminophen  650 mg Oral Once  . diphenhydrAMINE  25 mg Oral Once  . feeding supplement (ENSURE ENLIVE)  237 mL Oral TID BM  . finasteride  5 mg Oral Daily  . iron polysaccharides  150 mg Oral BID  . lactulose  10 g Oral BID  . multivitamin with minerals  1 tablet Oral Daily  . [START ON 04/17/2015] pantoprazole (PROTONIX) IV  40 mg Intravenous Q12H  . pravastatin  20 mg Oral q1800  . predniSONE  5 mg Oral Q breakfast  . psyllium  1 packet Oral Daily  . sodium chloride  3 mL Intravenous Q12H   Continuous Infusions: . sodium chloride 20 mL/hr at 04/15/15 1121  . dextrose 5 % and 0.9% NaCl 50 mL/hr at 04/15/15 1853  . diltiazem (CARDIZEM) infusion Stopped (04/15/15 0133)  . norepinephrine (LEVOPHED) Adult infusion Stopped (04/14/15 1600)  . octreotide  (SANDOSTATIN)    IV infusion Stopped (04/14/15 1300)  . pantoprozole (PROTONIX) infusion 8 mg/hr (04/15/15 1754)    Principal Problem:   Weakness Active Problems:    HYPERTENSION, BENIGN   CAD S/P LAD DES after FFR 06/22/14   CHF (congestive heart failure)   Hypothyroidism   Altered mental status   Normocytic anemia   Hematemesis   Duodenal cancer    Time spent: 25 min    [facility]  [facility] Pager [phone]*. If 7PM-7AM, please contact night-coverage at [email or web address], password [facility] 04/16/2015, 9:53 AM  LOS: 4 days

## 2015-04-17 ENCOUNTER — Inpatient Hospital Stay (HOSPITAL_COMMUNITY): Payer: Medicare Other

## 2015-04-17 MED ORDER — PANTOPRAZOLE SODIUM 40 MG PO TBEC: 40.0000 mg | DELAYED_RELEASE_TABLET | Freq: Two times a day (BID) | ORAL | Status: DC

## 2015-04-17 MED ORDER — SPIRONOLACTONE 25 MG PO TABS: 12.5000 mg | ORAL_TABLET | Freq: Every day | ORAL | Status: AC

## 2015-04-17 MED ORDER — HYDROCORTISONE 2.5 % RE CREA
TOPICAL_CREAM | Freq: Two times a day (BID) | RECTAL | Status: DC
  Administered 2015-04-17: 12:00:00 via TOPICAL
  Filled 2015-04-17: qty 28.35

## 2015-04-17 MED ORDER — FUROSEMIDE 40 MG PO TABS: 20.0000 mg | ORAL_TABLET | Freq: Every day | ORAL | Status: AC

## 2015-04-17 MED ORDER — HEPARIN SOD (PORK) LOCK FLUSH 100 UNIT/ML IV SOLN
500.0000 [IU] | INTRAVENOUS | Status: AC | PRN
Start: 2015-04-17 — End: 2015-04-17
  Administered 2015-04-17: 500 [IU]

## 2015-04-17 MED ORDER — HYDROCORTISONE 2.5 % RE CREA
TOPICAL_CREAM | Freq: Two times a day (BID) | RECTAL | Status: DC
Start: 2015-04-17 — End: 2015-06-08

## 2015-04-17 MED ORDER — SODIUM CHLORIDE 0.9 % IJ SOLN
10.0000 mL | INTRAMUSCULAR | Status: DC | PRN
  Administered 2015-04-17 (×2): 10 mL
  Filled 2015-04-17 (×2): qty 40

## 2015-04-17 NOTE — Progress Notes (Signed)
IP PROGRESS NOTE  Subjective:   James [name] reports no further hematemesis. He complains of a painful hemorrhoid. He has noted frequent bowel movements since starting lactulose.  Objective: Vital signs in last 24 hours: Blood pressure 122/54, pulse 65, temperature 97.7 F (36.5 C), temperature source Oral, resp. rate 18, height 5\' 11"  (1.803 m), weight 209 lb (94.802 kg), SpO2 100 %.  Intake/Output from previous day: 04/24 0701 - 04/25 0700 In: 175 [I.V.:175] Out: 650 [Urine:650]  Physical Exam:  HEENT: No thrush Lungs: Clear anteriorly Cardiac: Regular rate and rhythm Abdomen: Soft and nontender, distended Rectal: Erythema at the perineum, large soft hemorrhoid at the right anal verge Extremities: No leg edema Neurologic: Alert and oriented  Portacath/PICC-without erythema  Lab Results:  Recent Labs  04/16/15 0305 04/16/15 1148  WBC  --  9.6  HGB 8.0* 10.6*  HCT 25.5* 34.2*  PLT  --  182    BMET  Recent Labs  04/16/15 0945  NA 133*  K 3.7  CL 111  CO2 16*  GLUCOSE 160*  BUN 23  CREATININE 1.07  CALCIUM 7.8*    Studies/Results: No results found.  Medications: I have reviewed the patient's current medications.  Assessment/Plan:  1.Duodenal mass, abdominal/retroperitoneal lymphadenopathy, and liver metastases noted on a CT 03/01/2015  Upper endoscopy 03/03/2015 confirmed a duodenal mass, status post a biopsy with the final pathology confirming a carcinoma, immunohistochemical stains negative for lymphoma, neuroendocrine carcinoma, and melanoma markers. Positive staining for CDX2, cytokeratin AE1/AE3, and CD10  PET scan 03/29/2015 showed a large intensely hypermetabolic mass circumferentially narrowing the second portion of the duodenum. Adjacent hypermetabolic lymph nodes medial to the duodenum mass. Irregular hypermetabolic nodal mass in the inferior aspect of the gastrohepatic ligament. Hypermetabolic nodal metastasis in the retroperitoneum left of the  aorta. No abnormal metabolic activity within the enlarged inferior right lobe of the liver.  Cycle 1 FOLFOX 04/11/2015  2. Gastric outlet obstruction secondary to #1, status post a palliative gastrojejunostomy 03/09/2015  3. 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 Portal vein thrombosis diagnosed in September 2015  5. Left leg DVT December 2014  6. History of coronary artery disease, status post an LAD drug-eluting stent July 2015  7. Remote history of melanoma of the right ear  8. 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 History of drug related ITP  10. Abdominal pain secondary to the duodenal mass/liver metastases/adenopathy  11. Neuropathy  12. Altered mental status 04/12/2015-resolved  James [name] appears at baseline with regard to his mental status. The upper endoscopy 04/15/2015 confirmed the duodenal tumor as the bleeding source. No evidence of active bleeding at present. James [name] would like to proceed with chemotherapy as scheduled on 04/25/2015. We can consider an embolization procedure and palliative radiation for recurrent bleeding. I think we should hold the lactulose since it is not clear whether the elevated ammonia level was related to GI bleeding or liver disease.  Recommendations: 1. Continued antiacid therapy 2. Cancel lactulose 3. Diagnostic/therapeutic paracentesis if he has significant ascites 4. Follow-up as scheduled at the [facility] on 04/25/2015.   LOS: 5 days   Chazlyn Cude, Dominica Severin  04/17/2015, 9:41 AM

## 2015-04-17 NOTE — Consult Note (Signed)
   [facility] Inpatient Consult   04/17/2015  James [name] 11-Jun-1928 [id number]   Patient evaluated for [place] Management services. Came to bedside to speak with [name] to explain and offer [facility] Care Management. [name] pleasantly declines as he reports " I have so much follow up and am pretty covered". Accepted [facility] Care Management brochure to call in future if needed. Appreciative of visit. Will make inpatient RNCM aware.  Marthenia Rolling, MSN-Ed, RN,BSN [facility] Liaison [phone]

## 2015-04-17 NOTE — Progress Notes (Signed)
D/C instructions reviewed w/ pt and dtr, both verbalize understanding and all questions answered. Pt d/c in w/c in stable condition by NT to daughter's car. Pt in possession of d/c instructions and all personal belongings. Pt and dtr aware of need to pick up scripts at pharmacy on way home.

## 2015-04-17 NOTE — Progress Notes (Signed)
Physical Therapy Treatment Patient Details Name: James [name] MRN: [id number] DOB: 1928-09-18 Today's Date: May 06, 2015    History of Present Illness 81 yo male admitted with weakness, confusion. 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     PT Comments    Pt tolerated treatment well. Pt very agreeable to ambulation, as he reports he was unable to ambulate with nursing yesterday, however, pt had frequent BM urgency limiting ambulation distance. Pt plans to d/c home today and is progressing well with PT.    Follow Up Recommendations  Supervision for mobility/OOB     Equipment Recommendations  None recommended by PT    Recommendations for Other Services       Precautions / Restrictions Precautions Precautions: Fall Restrictions Weight Bearing Restrictions: No    Mobility  Bed Mobility Overal bed mobility: Needs Assistance Bed Mobility: Supine to Sit     Supine to sit: Supervision     General bed mobility comments: supervision for safety  Transfers Overall transfer level: Needs assistance Equipment used: None Transfers: Sit to/from Stand Sit to Stand: Min guard         General transfer comment: min guard for safety  Ambulation/Gait Ambulation/Gait assistance: Min guard Ambulation Distance (Feet): 160 Feet Assistive device: None Gait Pattern/deviations: Step-through pattern     General Gait Details: min guard for safety. decreased ambulation today due to BM urgency.    Stairs            Wheelchair Mobility    Modified Rankin (Stroke Patients Only)       Balance Overall balance assessment: No apparent balance deficits (not formally assessed)                                  Cognition Arousal/Alertness: Awake/alert Behavior During Therapy: WFL for tasks assessed/performed Overall Cognitive Status: Within Functional Limits for tasks assessed                      Exercises      General Comments        Pertinent Vitals/Pain Pain Assessment: No/denies pain    Home Living                      Prior Function            PT Goals (current goals can now be found in the care plan section) Progress towards PT goals: Progressing toward goals    Frequency  Min 3X/week    PT Plan Discharge plan needs to be updated    Co-evaluation             End of Session Equipment Utilized During Treatment: Gait belt Activity Tolerance: Patient tolerated treatment well Patient left:  (pt left in bathroom. discussed using pull cord for assistance)     Time: 3825-0539 PT Time Calculation (min) (ACUTE ONLY): 18 min  Charges:  $Gait Training: 8-22 mins                    G Codes:      Maxum Cassarino 2015-05-06, 2:54 PM Charlott Holler, SPT

## 2015-04-17 NOTE — Progress Notes (Signed)
Patient ID: James [name], male   DOB: 20-Aug-1928, 81 y.o.   MRN: [id number] Pt presented to [place] dept today for paracentesis. On limited US abd in all four quadrants there is only trace amount of ascites noted which is not amenable to safely tap at this time. Procedure cancelled.

## 2015-04-17 NOTE — Discharge Summary (Signed)
Physician Discharge Summary  James [name] KZS:[id number] DOB: November 06, 1928 [name]: 04/12/2015  PCP: [name], MD  Admit date: 04/12/2015 Discharge date: 04/17/2015  Time spent: 35* minutes  Recommendations for Outpatient Follow-up:  1. Follow up PCP in 2 weeks 2. Follow up Dr Benay Spice in one week  Discharge Diagnoses:  Principal Problem:   Weakness Active Problems:   HYPERTENSION, BENIGN   CAD S/P LAD DES after FFR 06/22/14   CHF (congestive heart failure)   Hypothyroidism   Altered mental status   Normocytic anemia   Hematemesis   Duodenal cancer   Discharge Condition: Stable  Diet recommendation: Regular diet  Filed Weights   04/12/15 2152 04/16/15 0059 04/17/15 0401  Weight: 93.305 kg (205 lb 11.2 oz) 97.2 kg (214 lb 4.6 oz) 94.802 kg (209 lb)    History of present illness:  81 y.o. 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 Patient was brought to the ER later and CT head did not show anything acute. Ammonia level is minimally elevated. Patient's hemoglobin shows decline from his base of ear. Per the family patient did not have any nausea vomiting abdominal pain diarrhea or chest pain or shortness of breath. As per the family patient's mentation has come back to baseline. On exam patient looks weak. Patient follows commands. Patient will be admitted for further management of patient generalized weakness and altered mental status which has improved at this time.   Hospital Course:  1. Hematemesis- patient started vomiting coffee-ground emesis and then bright red blood . Transferred to stepdown unit started on octreotide infusion, Protonix infusion, and GI consultation. Also received 2 units of PRBC. Gastroenterology performed upper GI endoscopy which showed duodenal cancer with ulcer. Also had small to  medium-sized esophageal varices which were not bleeding. Octreotide infusion was discontinued and patient started on Protonix 40 g IV every 12 hours. Hemoglobin has been stable.No more episodes of hematemesis. Patient's last hemoglobin is 10.6. He will be discharged home on po Protonix 40 mg po bid. 2. Anemia- likely combination of GI bleed as well as anemia of chronic disease due to the cancer. Stable. 3. Elevated ammonia-  Ammonia level was elevated to 127 which came down to 53 on 4/24. He does not have any mental status changes. Ammonia level was elevated probably due to GI bleed. Ultrasound did not show large ascites. No paracentesis was done. 4. Acute kidney injury- patient's BUN/creatinine is back to the baseline. 5. Hypothyroidism-continue Synthroid 6. Chronic diastolic CHF- currently compensated, patient's lungs are clear and he does not have shortness of breath despite getting blood transfusion. Will cut down the dose of lasix to 20 mg po daily, spironolactone to 12.5 mg po daily. 7. CAD status post stenting- stable 8. Cancer status post recent surgery on chemotherapy- followed by oncology Dr. Benay Spice.  Procedures:  Ultrasound abdomen  UGI endoscopy  Consultations:  GI  Oncology  Discharge Exam: Filed Vitals:   04/17/15 0401  BP: 122/54  Pulse: 65  Temp: 97.7 F (36.5 C)  Resp: 18    General: Appear in no acute distress Cardiovascular: S1s2 RRR Respiratory: Clear bilaterally  Discharge Instructions   Discharge Instructions    Diet - low sodium heart healthy    Complete by:  As directed      Increase activity slowly    Complete by:  As directed  Current Discharge Medication List    START taking these medications   Details  hydrocortisone (ANUSOL-HC) 2.5 % rectal cream Apply topically 2 (two) times daily. Qty: 30 g, Refills: 0   Associated Diagnoses: Duodenal cancer      CONTINUE these medications which have CHANGED   Details  furosemide  (LASIX) 40 MG tablet Take 0.5 tablets (20 mg total) by mouth daily. Qty: 30 tablet, Refills: 3    pantoprazole (PROTONIX) 40 MG tablet Take 1 tablet (40 mg total) by mouth 2 (two) times daily. Qty: 60 tablet, Refills: 2    spironolactone (ALDACTONE) 25 MG tablet Take 0.5 tablets (12.5 mg total) by mouth daily. Qty: 60 tablet, Refills: 2      CONTINUE these medications which have NOT CHANGED   Details  Cholecalciferol (VITAMIN D3) 2000 UNITS capsule Take 2,000 Units by mouth every morning.     feeding supplement, ENSURE ENLIVE, (ENSURE ENLIVE) LIQD Take 237 mLs by mouth 3 (three) times daily between meals. Qty: 237 mL, Refills: 12    finasteride (PROSCAR) 5 MG tablet Take 5 mg by mouth daily.    iron polysaccharides (NIFEREX) 150 MG capsule Take 150 mg by mouth 2 (two) times daily.    lidocaine-prilocaine (EMLA) cream Apply 1 application topically as needed. Qty: 30 g, Refills: 0    LIVALO 4 MG TABS TAKE 1 TABLET AT BEDTIME Qty: 90 tablet, Refills: 3    Multiple Vitamin (MULTIVITAMIN WITH MINERALS) TABS tablet Take 1 tablet by mouth daily.    OVER THE COUNTER MEDICATION Place 2 drops into both eyes 4 (four) times daily. Wash type eye drop-    oxyCODONE-acetaminophen (ROXICET) 5-325 MG per tablet Take 1 tablet by mouth every 4 (four) hours as needed for severe pain. Qty: 30 tablet, Refills: 0    predniSONE (DELTASONE) 5 MG tablet Take 1 tablet (5 mg total) by mouth daily with breakfast. Qty: 30 tablet, Refills: 1   Associated Diagnoses: Duodenal adenocarcinoma    Probiotic Product (PROBIOTIC & ACIDOPHILUS EX ST PO) Take 1 tablet by mouth every morning.     prochlorperazine (COMPAZINE) 5 MG tablet Take 1 tablet (5 mg total) by mouth every 6 (six) hours as needed for nausea or vomiting. Qty: 30 tablet, Refills: 0    Psyllium (METAMUCIL PO) Take 20 mLs by mouth every evening.    allopurinol (ZYLOPRIM) 300 MG tablet Take 0.5 tablets (150 mg total) by mouth every morning.     nitroGLYCERIN (NITROSTAT) 0.4 MG SL tablet Place 1 tablet (0.4 mg total) under the tongue every 5 (five) minutes as needed. For chest pain Qty: 25 tablet, Refills: 5   Associated Diagnoses: Shortness of breath      STOP taking these medications     aspirin EC 81 MG tablet      ranitidine (ZANTAC) 150 MG tablet        Allergies  Allergen Reactions  . Sulfa Antibiotics Other (See Comments)    Caused patient have ITP  . Aleve [Naproxen Sodium] Other (See Comments)    Causes platlet drop  . Rofecoxib Other (See Comments)    Celebrex  - affected low platelets  . Sulfamethoxazole-Trimethoprim Other (See Comments)     Causes low platelets and bleeding  . Tape Other (See Comments)    Adhesive tape makes skin tear.  . Tramadol Hcl Other (See Comments)    dizziness, drowsiness  . Neomycin-Bacitracin Zn-Polymyx Rash  . Neosporin [Neomycin-Polymyxin-Gramicidin] Rash  . Penicillins Swelling and Rash  Took dose of amoxicillin 2000mg  02/02/15 at dentist office without any complications      The results of significant diagnostics from this hospitalization (including imaging, microbiology, ancillary and laboratory) are listed below for reference.    Significant Diagnostic Studies: Ct Head Wo Contrast  04/12/2015   CLINICAL DATA:  81 year old male with altered mental status. Medical history includes gastric cancer metastatic to the liver and ongoing chemotherapy.  EXAM: CT HEAD WITHOUT CONTRAST  TECHNIQUE: Contiguous axial images were obtained from the base of the skull through the vertex without intravenous contrast.  COMPARISON:  Prior PET-CT 03/29/2015; prior brain MRI 01/06/2013; prior head CT 01/16/2011  FINDINGS: Negative for acute intracranial hemorrhage, acute infarction, mass, mass effect, hydrocephalus or midline shift. Gray-white differentiation is preserved throughout. Cerebral and cerebellar cortical volume loss. Mild chronic microvascular ischemic white matter disease. No focal  soft tissue or calvarial abnormality. Symmetric globes bilaterally. Normal aeration of the mastoid air cells paranasal sinuses. Mild hyperostosis frontalis interna. Intracranial atherosclerosis.  IMPRESSION: No acute intracranial abnormality.  Stable atrophy and mild chronic microvascular ischemic white matter disease.   Electronically Signed   By: Jacqulynn Cadet M.D.   On: 04/12/2015 20:11   Nm Pet Image Initial (pi) Skull Base To Thigh  03/29/2015   CLINICAL DATA:  Initial treatment strategy for duodenum all adenocarcinoma. Remote history of bladder carcinoma.  EXAM: NUCLEAR MEDICINE PET SKULL BASE TO THIGH  TECHNIQUE: 11.9 mCi F-18 FDG was injected intravenously. Full-Kirkpatrick PET imaging was performed from the skull base to thigh after the radiotracer. CT data was obtained and used for attenuation correction and anatomic localization.  FASTING BLOOD GLUCOSE:  Value: 132 mg/dl  COMPARISON:  CT 09/24/2014, 29,000  FINDINGS: NECK  No hypermetabolic lymph nodes in the neck.  CHEST  No hypermetabolic mediastinal or hilar nodes. No suspicious pulmonary nodules on the CT scan.  ABDOMEN/PELVIS  Large intensely hypermetabolic mass circumferentially narrowing the second portion duodenum. Mass measures 5.8 cm in diameter (image 122) with SUV max equal 11. There are adjacent hypermetabolic lymph nodes medial to the duodenum mass. For example 19 mm short axis lymph node on image 126 with SUV max 10.2. There is a a irregular hypermetabolic nodal mass in the inferior aspect of the gastro hepatic ligament measuring 37 mm with SUV max 7.5.  Hypermetabolic nodal metastasis in the retroperitoneum left of the aorta measures 14 mm with SUV equal 12.6  There is no abnormal metabolic activity within the enlarged inferior right lobe of the liver.  Patient status post gastrojejunostomy without complication.  SKELETON  There is diffuse mild increase in marrow activity which may relate to anemia. No abnormal uptake within the left iliac  psoas muscle where there is a small lucency on image 190.  IMPRESSION: 1. Intense hypermetabolic activity associated circumferential mural thickening of the second portion duodenum consistent with duodenal adenocarcinoma. 2. Patient status post gastrojejunostomy without complication 3. Local nodal metastasis medial and superior to the duodenum. 4. Retroperitoneal nodal metastasis left of the aorta. 5. No abnormal metabolic activity within the enlarged right lobe of liver. Enlargement likely relates to thrombosis of the right portal vein rather than liver metastasis. 6. No evidence of more distant metastatic disease 7. Diffuse increase in marrow activity likely relates to anemia   Electronically Signed   By: Suzy Bouchard M.D.   On: 03/29/2015 17:32   US Abdomen Limited  04/17/2015   CLINICAL DATA:  Duodenal adenocarcinoma.  Ascites.  EXAM: LIMITED ABDOMEN ULTRASOUND FOR ASCITES  TECHNIQUE:  Limited ultrasound survey for ascites was performed in all four abdominal quadrants.  COMPARISON:  None.  FINDINGS: A very small amount of ascites is seen in both lower quadrants in the right upper quadrant. This is felt to be insufficient amount for paracentesis.  IMPRESSION: Small amount of ascites seen in 3 of 4 abdominal quadrants.   Electronically Signed   By: Earle Gell M.D.   On: 04/17/2015 11:10   Dg Chest Port 1 View  04/12/2015   CLINICAL DATA:  81 year old male with 1 day history of weakness and lethargy. Current clinical history includes gastric cancer metastatic to the liver. Patient received chemotherapy yesterday.  EXAM: PORTABLE CHEST - 1 VIEW  COMPARISON:  Prior chest x-ray 04/05/2015  FINDINGS: Single-lumen power injectable port catheter via a right internal jugular venous approach. Catheter tip projects over the mid SVC. Mild cardiomegaly. Atherosclerotic calcifications present in the aorta. No focal airspace consolidation, pleural effusion or pneumothorax. No evidence of pulmonary edema. Inspiratory  volumes are slightly low with mild bibasilar atelectasis. No acute osseous abnormality.  IMPRESSION: Low inspiratory volumes with minimal bibasilar atelectasis.  No acute cardiopulmonary process.   Electronically Signed   By: Jacqulynn Cadet M.D.   On: 04/12/2015 18:31   Dg Chest Portable 1 View  04/05/2015   CLINICAL DATA:  Postop from Port-A-Cath placement. Duodenal adenocarcinoma.  EXAM: PORTABLE CHEST - 1 VIEW  COMPARISON:  03/01/2015  FINDINGS: A right-sided Port-A-Cath is seen with tip overlying the distal SVC. No pneumothorax identified.  Both lungs are clear. Heart size is within normal limits. Mild tortuosity and atherosclerotic calcification of thoracic aorta appears stable.  IMPRESSION: Right-sided Port-A-Cath tip in distal SVC. No evidence of pneumothorax or other acute findings.   Electronically Signed   By: Earle Gell M.D.   On: 04/05/2015 14:07   Dg Fluoro Guide Cv Line-no Report  04/05/2015   CLINICAL DATA:    FLOURO GUIDE CV LINE  Fluoroscopy was utilized by the requesting physician.  No radiographic  interpretation.     Microbiology: Recent Results (from the past 240 hour(s))  Urine culture     Status: None   Collection Time: 04/12/15  5:24 PM  Result Value Ref Range Status   Specimen Description URINE, CLEAN CATCH  Final   Special Requests NONE  Final   Colony Count NO GROWTH Performed at [facility]   Final   Culture NO GROWTH Performed at [facility]   Final   Report Status 04/14/2015 FINAL  Final  Culture, blood (routine x 2)     Status: None (Preliminary result)   Collection Time: 04/12/15  9:20 PM  Result Value Ref Range Status   Specimen Description BLOOD LEFT HAND  Final   Special Requests BOTTLES DRAWN AEROBIC ONLY 2CC  Final   Culture   Final           BLOOD CULTURE RECEIVED NO GROWTH TO DATE CULTURE WILL BE HELD FOR 5 DAYS BEFORE ISSUING A FINAL NEGATIVE REPORT Performed at [facility]    Report Status PENDING  Incomplete   Culture, blood (routine x 2)     Status: None (Preliminary result)   Collection Time: 04/12/15 10:17 PM  Result Value Ref Range Status   Specimen Description BLOOD LEFT FOREARM  Final   Special Requests BOTTLES DRAWN AEROBIC ONLY 3CC  Final   Culture   Final           BLOOD CULTURE RECEIVED NO GROWTH TO DATE CULTURE  WILL BE HELD FOR 5 DAYS BEFORE ISSUING A FINAL NEGATIVE REPORT Performed at [facility]    Report Status PENDING  Incomplete  MRSA PCR Screening     Status: None   Collection Time: 04/13/15  8:00 PM  Result Value Ref Range Status   MRSA by PCR NEGATIVE NEGATIVE Final    Comment:        The GeneXpert MRSA Assay (FDA approved for NASAL specimens only), is one component of a comprehensive MRSA colonization surveillance program. It is not intended to diagnose MRSA infection nor to guide or monitor treatment for MRSA infections.      Labs: Basic Metabolic Panel:  Recent Labs Lab 04/11/15 1249 04/12/15 1816 04/13/15 0530 04/14/15 0645 04/16/15 0945  NA 135* 135 137 138 133*  K 4.2 4.6 4.7 4.0 3.7  CL  --  101 105 109 111  CO2 17* 22 21 21  16*  GLUCOSE 210* 146* 151* 152* 160*  BUN 37.3* 55* 63* 56* 23  CREATININE 1.5* 1.84* 1.79* 1.72* 1.07  CALCIUM 9.0 8.9 9.0 8.4 7.8*   Liver Function Tests:  Recent Labs Lab 04/11/15 1249 04/12/15 1816 04/13/15 0530 04/16/15 0945  AST 40* 53* 49* 34  ALT 76* 78* 93* 80*  ALKPHOS 501* 382* 357* 212*  BILITOT 1.30* 1.3* 1.1 1.3*  PROT 6.6 6.5 6.3 5.6*  ALBUMIN 3.0* 3.0* 3.1* 2.7*   No results for input(s): LIPASE, AMYLASE in the last 168 hours.  Recent Labs Lab 04/12/15 1816 04/13/15 0655 04/14/15 2153 04/16/15 0945  AMMONIA 50* 127* 91* 53*   CBC:  Recent Labs Lab 04/11/15 1249  04/12/15 1816 04/13/15 0530  04/14/15 0645  04/15/15 0242 04/15/15 1050 04/15/15 2108 04/16/15 0305 04/16/15 1148  WBC 16.3*  --  19.7* 16.6*  --  9.2  --   --   --   --   --  9.6  NEUTROABS 14.1*  --   17.1* 14.4*  --   --   --   --   --   --   --  8.8*  HGB 8.3*  < > 7.7* 8.6*  < > 8.9*  < > 8.3* 8.1* 7.7* 8.0* 10.6*  HCT 27.1*  < > 25.1* 27.5*  < > 27.6*  < > 26.4* 26.0* 24.9* 25.5* 34.2*  MCV 91.6  --  90.6 90.2  --  88.7  --   --   --   --   --  90.7  PLT 514*  --  475* 438*  --  301  --   --   --   --   --  182  < > = values in this interval not displayed. Cardiac Enzymes:  Recent Labs Lab 04/13/15 0530  CKTOTAL 23  TROPONINI 0.04*   BNP: BNP (last 3 results) No results for input(s): BNP in the last 8760 hours.  ProBNP (last 3 results)  Recent Labs  06/06/14 1244 11/29/14 0944 03/22/15 1513  PROBNP 83.0 114.6 71.0    CBG:  Recent Labs Lab 04/13/15 1944  GLUCAP 162*       Signed:  LAMA,GAGAN S  [facility] 04/17/2015, 1:21 PM

## 2015-04-18 ENCOUNTER — Encounter (HOSPITAL_COMMUNITY): Payer: Self-pay | Admitting: Internal Medicine

## 2015-04-19 LAB — CULTURE, BLOOD (ROUTINE X 2)
Culture: NO GROWTH
Culture: NO GROWTH

## 2015-04-21 ENCOUNTER — Ambulatory Visit: Payer: Medicare Other | Admitting: Internal Medicine

## 2015-04-23 ENCOUNTER — Other Ambulatory Visit: Payer: Self-pay | Admitting: Oncology

## 2015-04-24 ENCOUNTER — Ambulatory Visit (INDEPENDENT_AMBULATORY_CARE_PROVIDER_SITE_OTHER): Payer: Medicare Other | Admitting: Family Medicine

## 2015-04-24 ENCOUNTER — Encounter: Payer: Self-pay | Admitting: Family Medicine

## 2015-04-24 VITALS — BP 130/58 | HR 75 | Temp 97.4°F | Wt 218.8 lb

## 2015-04-24 DIAGNOSIS — K649 Unspecified hemorrhoids: Secondary | ICD-10-CM

## 2015-04-24 DIAGNOSIS — I2510 Atherosclerotic heart disease of native coronary artery without angina pectoris: Secondary | ICD-10-CM | POA: Diagnosis not present

## 2015-04-24 DIAGNOSIS — R059 Cough, unspecified: Secondary | ICD-10-CM

## 2015-04-24 DIAGNOSIS — R05 Cough: Secondary | ICD-10-CM

## 2015-04-24 DIAGNOSIS — R7989 Other specified abnormal findings of blood chemistry: Secondary | ICD-10-CM

## 2015-04-24 DIAGNOSIS — E039 Hypothyroidism, unspecified: Secondary | ICD-10-CM | POA: Diagnosis not present

## 2015-04-24 DIAGNOSIS — C170 Malignant neoplasm of duodenum: Secondary | ICD-10-CM

## 2015-04-24 MED ORDER — BENZONATATE 200 MG PO CAPS: 200.0000 mg | ORAL_CAPSULE | Freq: Three times a day (TID) | ORAL | Status: AC | PRN

## 2015-04-24 MED ORDER — LOPERAMIDE HCL 2 MG PO TABS: 2.0000 mg | ORAL_TABLET | Freq: Two times a day (BID) | ORAL | Status: DC | PRN

## 2015-04-24 MED ORDER — PRAMOXINE HCL 1 % RE FOAM: 1.0000 "application " | Freq: Three times a day (TID) | RECTAL | Status: DC | PRN

## 2015-04-24 MED ORDER — DIBUCAINE 1 % EX OINT: TOPICAL_OINTMENT | Freq: Three times a day (TID) | CUTANEOUS | Status: DC | PRN

## 2015-04-24 NOTE — Progress Notes (Signed)
Pre visit review using our clinic review tool, if applicable. No additional management support is needed unless otherwise documented below in the visit note.  Abnormal TSH but off replacement.   D/w pt about recheck TSH with next set of labs.  I put in the order and I'll ask for this to be done at [facility] center.    Ext hemorrhoids.  Leaking stool occ  Diarrhea frequently.   Not on imodium.  Was prev on lactulose in hospital, not since then.  BMs some thicker in the meantime off lactulose.    He has f/u with onc center re: duodenal cancer pending for f/u chemo.  He isn't having fevers but he continues with a dry cough.  No sputum.   Meds, vitals, and allergies reviewed.   ROS: See HPI.  Otherwise, noncontributory.  nad but chronically ill appearing Mmm, OP wnl Neck supple, no LA rrr Ctab, no focal dec in BS noted abd soft, obese, not ttp Ext with 1+ BLE edema Ext rectal exam with nonbleeding hemorrhoid noted, no gross blood.

## 2015-04-24 NOTE — Patient Instructions (Addendum)
Ask to get your TSH drawn tomorrow with your other labs. We'll contact you with your lab report. Tessalon for cough.  Nupercaine and proctofoam for the hemorrhoids.  Imodium for diarrhea, up to twice a day.  Take care.  Glad to see you.  I'll await the oncology notes.

## 2015-04-25 ENCOUNTER — Other Ambulatory Visit (HOSPITAL_BASED_OUTPATIENT_CLINIC_OR_DEPARTMENT_OTHER): Payer: Medicare Other

## 2015-04-25 ENCOUNTER — Ambulatory Visit (HOSPITAL_BASED_OUTPATIENT_CLINIC_OR_DEPARTMENT_OTHER): Payer: Medicare Other | Admitting: Oncology

## 2015-04-25 ENCOUNTER — Other Ambulatory Visit: Payer: TRICARE For Life (TFL)

## 2015-04-25 ENCOUNTER — Ambulatory Visit (HOSPITAL_BASED_OUTPATIENT_CLINIC_OR_DEPARTMENT_OTHER): Payer: Medicare Other

## 2015-04-25 ENCOUNTER — Ambulatory Visit (HOSPITAL_COMMUNITY)
Admission: RE | Admit: 2015-04-25 | Discharge: 2015-04-25 | Disposition: A | Discharge status: Home / Self Care | Payer: Medicare Other | Source: Ambulatory Visit | Attending: Oncology | Admitting: Oncology

## 2015-04-25 VITALS — BP 111/47 | HR 70 | Temp 97.9°F | Resp 20

## 2015-04-25 DIAGNOSIS — C170 Malignant neoplasm of duodenum: Secondary | ICD-10-CM

## 2015-04-25 DIAGNOSIS — C787 Secondary malignant neoplasm of liver and intrahepatic bile duct: Secondary | ICD-10-CM

## 2015-04-25 DIAGNOSIS — G893 Neoplasm related pain (acute) (chronic): Secondary | ICD-10-CM

## 2015-04-25 DIAGNOSIS — G629 Polyneuropathy, unspecified: Secondary | ICD-10-CM

## 2015-04-25 DIAGNOSIS — D500 Iron deficiency anemia secondary to blood loss (chronic): Secondary | ICD-10-CM | POA: Insufficient documentation

## 2015-04-25 DIAGNOSIS — R599 Enlarged lymph nodes, unspecified: Secondary | ICD-10-CM

## 2015-04-25 DIAGNOSIS — Z86718 Personal history of other venous thrombosis and embolism: Secondary | ICD-10-CM | POA: Diagnosis not present

## 2015-04-25 DIAGNOSIS — Z86711 Personal history of pulmonary embolism: Secondary | ICD-10-CM | POA: Diagnosis not present

## 2015-04-25 DIAGNOSIS — D6481 Anemia due to antineoplastic chemotherapy: Secondary | ICD-10-CM | POA: Diagnosis not present

## 2015-04-25 DIAGNOSIS — K644 Residual hemorrhoidal skin tags: Secondary | ICD-10-CM | POA: Diagnosis not present

## 2015-04-25 DIAGNOSIS — Z85820 Personal history of malignant melanoma of skin: Secondary | ICD-10-CM

## 2015-04-25 DIAGNOSIS — Z5111 Encounter for antineoplastic chemotherapy: Secondary | ICD-10-CM

## 2015-04-25 LAB — COMPREHENSIVE METABOLIC PANEL (CC13)
ALT: 76 U/L — AB (ref 0–55)
ANION GAP: 14 meq/L — AB (ref 3–11)
AST: 37 U/L — AB (ref 5–34)
Albumin: 2.4 g/dL — ABNORMAL LOW (ref 3.5–5.0)
Alkaline Phosphatase: 320 U/L — ABNORMAL HIGH (ref 40–150)
BILIRUBIN TOTAL: 0.89 mg/dL (ref 0.20–1.20)
BUN: 15.3 mg/dL (ref 7.0–26.0)
CO2: 16 meq/L — AB (ref 22–29)
CREATININE: 1 mg/dL (ref 0.7–1.3)
Calcium: 9.5 mg/dL (ref 8.4–10.4)
Chloride: 107 mEq/L (ref 98–109)
EGFR: 71 mL/min/{1.73_m2} — ABNORMAL LOW (ref >90)
Glucose: 143 mg/dl — ABNORMAL HIGH (ref 70–140)
Potassium: 4.3 mEq/L (ref 3.5–5.1)
Sodium: 138 mEq/L (ref 136–145)
Total Protein: 5.7 g/dL — ABNORMAL LOW (ref 6.4–8.3)

## 2015-04-25 LAB — CBC WITH DIFFERENTIAL/PLATELET
BASO%: 0.5 % (ref 0.0–2.0)
Basophils Absolute: 0 10*3/uL (ref 0.0–0.1)
EOS%: 0.5 % (ref 0.0–7.0)
Eosinophils Absolute: 0 10*3/uL (ref 0.0–0.5)
HEMATOCRIT: 25.9 % — AB (ref 38.4–49.9)
HEMOGLOBIN: 8 g/dL — AB (ref 13.0–17.1)
LYMPH#: 0.9 10*3/uL (ref 0.9–3.3)
LYMPH%: 19.2 % (ref 14.0–49.0)
MCH: 27.1 pg — ABNORMAL LOW (ref 27.2–33.4)
MCHC: 30.9 g/dL — ABNORMAL LOW (ref 32.0–36.0)
MCV: 87.8 fL (ref 79.3–98.0)
MONO#: 0.6 10*3/uL (ref 0.1–0.9)
MONO%: 12.7 % (ref 0.0–14.0)
NEUT%: 67.1 % (ref 39.0–75.0)
NEUTROS ABS: 3 10*3/uL (ref 1.5–6.5)
Platelets: 231 10*3/uL (ref 140–400)
RBC: 2.95 10*6/uL — ABNORMAL LOW (ref 4.20–5.82)
RDW: 18.1 % — AB (ref 11.0–14.6)
WBC: 4.4 10*3/uL (ref 4.0–10.3)

## 2015-04-25 LAB — PREPARE RBC (CROSSMATCH)

## 2015-04-25 LAB — FERRITIN CHCC: Ferritin: 126 ng/ml (ref 22–316)

## 2015-04-25 LAB — TSH: TSH: 29.03 u[IU]/mL — AB (ref <=5.90)

## 2015-04-25 LAB — HOLD TUBE, BLOOD BANK

## 2015-04-25 MED ORDER — OXYCODONE-ACETAMINOPHEN 5-325 MG PO TABS
1.0000 | ORAL_TABLET | Freq: Once | ORAL | Status: AC
  Administered 2015-04-25: 1 via ORAL

## 2015-04-25 MED ORDER — DEXTROSE 5 % IV SOLN
280.0000 mg/m2 | Freq: Once | INTRAVENOUS | Status: AC
  Administered 2015-04-25: 630 mg via INTRAVENOUS
  Filled 2015-04-25: qty 31.5

## 2015-04-25 MED ORDER — SODIUM CHLORIDE 0.9 % IV SOLN
1680.0000 mg/m2 | INTRAVENOUS | Status: DC
  Administered 2015-04-25: 3800 mg via INTRAVENOUS
  Filled 2015-04-25: qty 76

## 2015-04-25 MED ORDER — OXALIPLATIN CHEMO INJECTION 100 MG/20ML
85.0000 mg/m2 | Freq: Once | INTRAVENOUS | Status: AC
  Administered 2015-04-25: 190 mg via INTRAVENOUS
  Filled 2015-04-25: qty 38

## 2015-04-25 MED ORDER — OXYCODONE-ACETAMINOPHEN 5-325 MG PO TABS
ORAL_TABLET | ORAL | Status: AC
  Filled 2015-04-25: qty 1

## 2015-04-25 MED ORDER — DEXTROSE 5 % IV SOLN
Freq: Once | INTRAVENOUS | Status: AC
  Administered 2015-04-25: 14:00:00 via INTRAVENOUS

## 2015-04-25 MED ORDER — DEXAMETHASONE SODIUM PHOSPHATE 100 MG/10ML IJ SOLN
Freq: Once | INTRAMUSCULAR | Status: AC
  Administered 2015-04-25: 14:00:00 via INTRAVENOUS
  Filled 2015-04-25: qty 4

## 2015-04-25 MED ORDER — FLUOROURACIL CHEMO INJECTION 2.5 GM/50ML
280.0000 mg/m2 | Freq: Once | INTRAVENOUS | Status: AC
  Administered 2015-04-25: 650 mg via INTRAVENOUS
  Filled 2015-04-25: qty 13

## 2015-04-25 NOTE — Patient Instructions (Signed)
[facility] Discharge Instructions for Patients Receiving Chemotherapy  Today you received the following chemotherapy agents adrucil, lecovorin and oxaliplatin.  To help prevent nausea and vomiting after your treatment, we encourage you to take your nausea medication as directed.    If you develop nausea and vomiting that is not controlled by your nausea medication, call the clinic.   BELOW ARE SYMPTOMS THAT SHOULD BE REPORTED IMMEDIATELY:  *FEVER GREATER THAN 100.5 F  *CHILLS WITH OR WITHOUT FEVER  NAUSEA AND VOMITING THAT IS NOT CONTROLLED WITH YOUR NAUSEA MEDICATION  *UNUSUAL SHORTNESS OF BREATH  *UNUSUAL BRUISING OR BLEEDING  TENDERNESS IN MOUTH AND THROAT WITH OR WITHOUT PRESENCE OF ULCERS  *URINARY PROBLEMS  *BOWEL PROBLEMS  UNUSUAL RASH Items with * indicate a potential emergency and should be followed up as soon as possible.  Feel free to call the clinic you have any questions or concerns. The clinic phone number is [phone].  Please show the [place] at check-in to the Emergency Department and triage nurse.

## 2015-04-25 NOTE — Progress Notes (Signed)
  [facility] OFFICE PROGRESS NOTE   Diagnosis: Small bowel carcinoma  INTERVAL HISTORY:   James [name] was admitted on 04/12/2015 with an altered mental status. His mental status cleared while in the hospital. He completed a first cycle of FOLFOX 04/11/2015. While in the hospital he was noted to have hematemesis. An upper endoscopy confirmed ulceration and an adherent clot at the duodenal mass. James [name] denies recurrent bleeding. His chief complaint is hemorrhoid pain. He has been prescribed multiple hemorrhoid creams including new medication by Dr. Damita Dunnings yesterday. He has pain at the right abdomen when he lies on his right side. James [name] is ambulatory in the home. He is eating.  Objective:  Vital signs in last 24 hours:  Blood pressure 111/47, pulse 70, temperature 97.9 F (36.6 C), temperature source Oral, resp. rate 20, SpO2 99 %.    HEENT: No thrush or ulcers Resp: Lungs clear bilaterally Cardio: Regular rate and rhythm GI: Fullness in the right abdomen without a discrete mass. Nontender. Vascular: No leg edema Neuro: Alert and oriented  Rectal: Large hemorrhoid at the right side of the anal verge, reducible , tender  Portacath/PICC-without erythema  Lab Results:  Lab Results  Component Value Date   WBC 4.4 04/25/2015   HGB 8.0* 04/25/2015   HCT 25.9* 04/25/2015   MCV 87.8 04/25/2015   PLT 231 04/25/2015   NEUTROABS 3.0 04/25/2015     Medications: I have reviewed the patient's current medications.  Assessment/Plan: 1.Duodenal mass, abdominal/retroperitoneal lymphadenopathy, and liver metastases noted on a CT 03/01/2015  Upper endoscopy 03/03/2015 confirmed a duodenal mass, status post a biopsy with the final pathology confirming a carcinoma, immunohistochemical stains negative for lymphoma, neuroendocrine carcinoma, and melanoma markers. Positive staining for CDX2, cytokeratin AE1/AE3, and CD10  PET scan 03/29/2015 showed a large intensely  hypermetabolic mass circumferentially narrowing the second portion of the duodenum. Adjacent hypermetabolic lymph nodes medial to the duodenum mass. Irregular hypermetabolic nodal mass in the inferior aspect of the gastrohepatic ligament. Hypermetabolic nodal metastasis in the retroperitoneum left of the aorta. No abnormal metabolic activity within the enlarged inferior right lobe of the liver.  Cycle 1 FOLFOX 04/11/2015  2. Gastric outlet obstruction secondary to #1, status post a palliative gastrojejunostomy 03/09/2015  3. 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 Portal vein thrombosis diagnosed in September 2015  5. Left leg DVT December 2014  6. History of coronary artery disease, status post an LAD drug-eluting stent July 2015  7. Remote history of melanoma of the right ear  8. 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 History of drug related ITP  10. Abdominal pain secondary to the duodenal mass/liver metastases/adenopathy  11. Neuropathy  12. Altered mental status 04/12/2015-resolved  13. External hemorrhoid-he will continue hemorrhoid creams, I will refer him to Dr. Excell Seltzer  James [name] appears stable. He would like to proceed with cycle 2 FOLFOX. The hemoglobin has drifted down since he was transfused in the hospital. He denies gross bleeding. We will arrange for a red cell transfusion. He will return for an office visit and chemotherapy in 2 weeks. We will see him in the interim as needed.  Betsy Coder, MD  04/25/2015  11:22 AM

## 2015-04-26 ENCOUNTER — Telehealth: Payer: Self-pay | Admitting: *Deleted

## 2015-04-26 ENCOUNTER — Encounter: Payer: Self-pay | Admitting: Family Medicine

## 2015-04-26 DIAGNOSIS — K649 Unspecified hemorrhoids: Secondary | ICD-10-CM | POA: Insufficient documentation

## 2015-04-26 DIAGNOSIS — R059 Cough, unspecified: Secondary | ICD-10-CM | POA: Insufficient documentation

## 2015-04-26 DIAGNOSIS — R05 Cough: Secondary | ICD-10-CM | POA: Insufficient documentation

## 2015-04-26 NOTE — Assessment & Plan Note (Signed)
Can try nupercaine and proctofoam for now.  That may do as much good as anything for comfort.  Has a pillow to use.  The diarrhea appears to be incidental, but clearly isn't helpful; can try imodium prn.

## 2015-04-26 NOTE — Telephone Encounter (Signed)
Received call from daughter Mithcell Schumpert re: pt had chemo yesterday, and is doing fine.  However, pt has pest control service scheduled today.  [name] wanted to know if it is ok for pest service to be done today.  Discussed with Lavella Lemons, desk nurse.   Called [name] back and left message on voice mail re:  OK for pest service to be done outside.  If inside the house needs to be serviced, pt can leave the house for at least couple of hours to make sure all the fumes evaporated, and pt does not need to inhale any chemical fumes. [name]'s   Phone      [phone].

## 2015-04-26 NOTE — Assessment & Plan Note (Signed)
With clear lungs and no fever, would try tessalon for now.  He agrees.  Still okay for outpatient f/u.

## 2015-04-26 NOTE — Assessment & Plan Note (Signed)
Per onc.  App help from onc.

## 2015-04-26 NOTE — Assessment & Plan Note (Signed)
He's off replacement for now, we'll ask for f/u TSH with next set of labs.

## 2015-04-27 ENCOUNTER — Other Ambulatory Visit: Payer: Self-pay | Admitting: *Deleted

## 2015-04-27 ENCOUNTER — Ambulatory Visit (HOSPITAL_BASED_OUTPATIENT_CLINIC_OR_DEPARTMENT_OTHER): Payer: Medicare Other

## 2015-04-27 VITALS — BP 96/44 | HR 57 | Temp 97.8°F | Resp 20

## 2015-04-27 DIAGNOSIS — C170 Malignant neoplasm of duodenum: Secondary | ICD-10-CM

## 2015-04-27 DIAGNOSIS — Z95828 Presence of other vascular implants and grafts: Secondary | ICD-10-CM

## 2015-04-27 DIAGNOSIS — C787 Secondary malignant neoplasm of liver and intrahepatic bile duct: Secondary | ICD-10-CM | POA: Diagnosis not present

## 2015-04-27 MED ORDER — SODIUM CHLORIDE 0.9 % IJ SOLN
10.0000 mL | INTRAMUSCULAR | Status: DC | PRN
  Administered 2015-04-27: 10 mL via INTRAVENOUS
  Filled 2015-04-27: qty 10

## 2015-04-27 MED ORDER — HEPARIN SOD (PORK) LOCK FLUSH 100 UNIT/ML IV SOLN
500.0000 [IU] | Freq: Once | INTRAVENOUS | Status: AC
  Administered 2015-04-27: 500 [IU] via INTRAVENOUS
  Filled 2015-04-27: qty 5

## 2015-04-27 NOTE — Patient Instructions (Signed)
Fluorouracil, 5FU; Diclofenac topical cream What is this medicine? FLUOROURACIL; DICLOFENAC (flure oh YOOR a sil; dye KLOE fen ak) is a combination of a topical chemotherapy agent and non-steroidal anti-inflammatory drug (NSAID). It is used on the skin to treat skin cancer and skin conditions that could become cancer. This medicine may be used for other purposes; ask your health care provider or pharmacist if you have questions. COMMON BRAND NAME(S): FLUORAC What should I tell my health care provider before I take this medicine? They need to know if you have any of these conditions: -bleeding problems -cigarette smoker -DPD enzyme deficiency -heart disease -high blood pressure -if you frequently drink alcohol containing drinks -kidney disease -liver disease -open or infected skin -stomach problems -swelling or open sores at the treatment site -recent or planned coronary artery bypass graft (CABG) surgery -an unusual or allergic reaction to fluorouracil, diclofenac, aspirin, other NSAIDs, other medicines, foods, dyes, or preservatives -pregnant or trying to get pregnant -breast-feeding How should I use this medicine? This medicine is only for use on the skin. Follow the directions on the prescription label. Wash hands before and after use. Wash affected area and gently pat dry. To apply this medicine use a cotton-tipped applicator, or use gloves if applying with fingertips. If applied with unprotected fingertips, it is very important to wash your hands well after you apply this medicine. Avoid applying to the eyes, nose, or mouth. Apply enough medicine to cover the affected area. You can cover the area with a light gauze dressing, but do not use tight or air-tight dressings. Finish the full course prescribed by your doctor or health care professional, even if you think your condition is better. Do not stop taking except on the advice of your doctor or health care professional. Talk to your  pediatrician regarding the use of this medicine in children. Special care may be needed. Overdosage: If you think you've taken too much of this medicine contact a poison control center or emergency room at once. Overdosage: If you think you have taken too much of this medicine contact a poison control center or emergency room at once. NOTE: This medicine is only for you. Do not share this medicine with others. What if I miss a dose? If you miss a dose, apply it as soon as you can. If it is almost time for your next dose, only use that dose. Do not apply extra doses. Contact your doctor or health care professional if you miss more than one dose. What may interact with this medicine? Interactions are not expected. Do not use any other skin products without telling your doctor or health care professional. This list may not describe all possible interactions. Give your health care provider a list of all the medicines, herbs, non-prescription drugs, or dietary supplements you use. Also tell them if you smoke, drink alcohol, or use illegal drugs. Some items may interact with your medicine. What should I watch for while using this medicine? Visit your doctor or health care professional for checks on your progress. You will need to use this medicine for 2 to 6 weeks. This may be longer depending on the condition being treated. You may not see full healing for another 1 to 2 months after you stop using the medicine. Treated areas of skin can look unsightly during and for several weeks after treatment with this medicine. This medicine can make you more sensitive to the sun. Keep out of the sun. If you cannot avoid being in   the sun, wear protective clothing and use sunscreen. Do not use sun lamps or tanning beds/booths. Where should I keep my What side effects may I notice from receiving this medicine? Side effects that you should report to your doctor or health care professional as soon as possible: -allergic  reactions like skin rash, itching or hives, swelling of the face, lips, or tongue -black or bloody stools, blood in the urine or vomit -blurred vision -chest pain -difficulty breathing or wheezing -redness, blistering, peeling or loosening of the skin, including inside the mouth -severe redness and swelling of normal skin -slurred speech or weakness on one side of the body -trouble passing urine or change in the amount of urine -unexplained weight gain or swelling -unusually weak or tired -yellowing of eyes or skin Side effects that usually do not require medical attention (Report these to your doctor or health care professional if they continue or are bothersome.): -increased sensitivity of the skin to sun and ultraviolet light -pain and burning of the affected area -scaling or swelling of the affected area -skin rash, itching of the affected area -tenderness This list may not describe all possible side effects. Call your doctor for medical advice about side effects. You may report side effects to FDA at 1-800-FDA-1088. Where should I keep my medicine? Keep out of the reach of children. Store at room temperature between 20 and 25 degrees C (68 and 77 degrees F). Throw away any unused medicine after the expiration date. NOTE: This sheet is a summary. It may not cover all possible information. If you have questions about this medicine, talk to your doctor, pharmacist, or health care provider.  2015, Elsevier/Gold Standard. (2014-04-11 11:09:58)  

## 2015-04-28 ENCOUNTER — Ambulatory Visit (HOSPITAL_BASED_OUTPATIENT_CLINIC_OR_DEPARTMENT_OTHER): Payer: Medicare Other

## 2015-04-28 VITALS — BP 139/62 | HR 66 | Temp 98.6°F | Resp 18

## 2015-04-28 DIAGNOSIS — D6481 Anemia due to antineoplastic chemotherapy: Secondary | ICD-10-CM

## 2015-04-28 DIAGNOSIS — D500 Iron deficiency anemia secondary to blood loss (chronic): Secondary | ICD-10-CM | POA: Diagnosis not present

## 2015-04-28 DIAGNOSIS — C787 Secondary malignant neoplasm of liver and intrahepatic bile duct: Secondary | ICD-10-CM

## 2015-04-28 DIAGNOSIS — C170 Malignant neoplasm of duodenum: Secondary | ICD-10-CM

## 2015-04-28 MED ORDER — SODIUM CHLORIDE 0.9 % IV SOLN
250.0000 mL | Freq: Once | INTRAVENOUS | Status: AC
  Administered 2015-04-28: 250 mL via INTRAVENOUS

## 2015-04-28 MED ORDER — HEPARIN SOD (PORK) LOCK FLUSH 100 UNIT/ML IV SOLN
500.0000 [IU] | Freq: Every day | INTRAVENOUS | Status: AC | PRN
  Administered 2015-04-28: 500 [IU]
  Filled 2015-04-28: qty 5

## 2015-04-28 MED ORDER — LOPERAMIDE HCL 2 MG PO TABS
4.0000 mg | ORAL_TABLET | Freq: Once | ORAL | Status: AC
  Administered 2015-04-28: 4 mg via ORAL
  Filled 2015-04-28: qty 2

## 2015-04-28 MED ORDER — SODIUM CHLORIDE 0.9 % IJ SOLN
10.0000 mL | INTRAMUSCULAR | Status: AC | PRN
  Administered 2015-04-28: 10 mL
  Filled 2015-04-28: qty 10

## 2015-04-28 MED ORDER — LOPERAMIDE HCL 2 MG PO CAPS
ORAL_CAPSULE | ORAL | Status: AC
  Filled 2015-04-28: qty 2

## 2015-04-28 NOTE — Patient Instructions (Signed)

## 2015-04-29 LAB — TYPE AND SCREEN
ABO/RH(D): A NEG
Antibody Screen: NEGATIVE
Unit division: 0
Unit division: 0

## 2015-05-02 ENCOUNTER — Ambulatory Visit (INDEPENDENT_AMBULATORY_CARE_PROVIDER_SITE_OTHER): Payer: Medicare Other | Admitting: Oncology

## 2015-05-02 VITALS — BP 119/61 | HR 72 | Temp 97.5°F | Ht 71.5 in

## 2015-05-02 DIAGNOSIS — K922 Gastrointestinal hemorrhage, unspecified: Secondary | ICD-10-CM | POA: Diagnosis not present

## 2015-05-02 DIAGNOSIS — Z95818 Presence of other cardiac implants and grafts: Secondary | ICD-10-CM | POA: Diagnosis not present

## 2015-05-02 DIAGNOSIS — C787 Secondary malignant neoplasm of liver and intrahepatic bile duct: Secondary | ICD-10-CM

## 2015-05-02 DIAGNOSIS — C170 Malignant neoplasm of duodenum: Secondary | ICD-10-CM

## 2015-05-02 DIAGNOSIS — C779 Secondary and unspecified malignant neoplasm of lymph node, unspecified: Secondary | ICD-10-CM | POA: Diagnosis not present

## 2015-05-02 DIAGNOSIS — I81 Portal vein thrombosis: Secondary | ICD-10-CM | POA: Diagnosis present

## 2015-05-02 DIAGNOSIS — Z7952 Long term (current) use of systemic steroids: Secondary | ICD-10-CM

## 2015-05-02 DIAGNOSIS — I2510 Atherosclerotic heart disease of native coronary artery without angina pectoris: Secondary | ICD-10-CM | POA: Diagnosis not present

## 2015-05-02 DIAGNOSIS — Z86718 Personal history of other venous thrombosis and embolism: Secondary | ICD-10-CM

## 2015-05-02 NOTE — Progress Notes (Signed)
Patient ID: James [name], male   DOB: Nov 11, 1928, 81 y.o.   MRN: [id number] Hematology and Oncology Follow Up Visit  BLONG BUSK [id number] 09/11/1928 81 y.o. 05/02/2015 10:55 AM   Principle Diagnosis: Encounter Diagnoses  Name Primary?  . Duodenal cancer Yes  . History of DVT of lower extremity   . Portal vein thrombosis   . History of DVT (deep vein thrombosis)      Interim History:  A lot has transpired since his last office visit with me in February. Although we had a suspicion that he might have an underlying malignancy to explain recurrent thrombotic events first in the left leg veins then the portal veins, in addition to unexplained recurrent GI bleeding, despite upper and lower endoscopy and two capsule endoscopy procedures, we were unable to locate a focal source of bleeding until he presented to the hospital on 03/01/2015 with acute right upper quadrant abdominal pain and was found to have a 4 x 4 centimeter soft tissue mass in the area of the duodenum with associated bulky portal, pancreatic and celiac axis adenopathy. Changes in his liver previously felt to be vascular changes related to acute portal vein thrombosis now clearly seem to be related to metastatic cancer. Upper endoscopy on March 11 revealed a fungating mass in the duodenal bulb just distal to the pylorus which was almost completely obstructing. Pathology showed a poorly differentiated carcinoma. 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 Findings were felt to be consistent with a GI primary. He underwent a palliative bypass procedure to relieve bowel obstruction. He was seen in consultation by Dr. Benay Spice. When he otherwise recovered from the surgery he was started on a chemotherapy program with FOLFOX beginning on 04/11/2015. The chemotherapy has caused colitis with associated diarrhea. He has had ongoing transfusion dependent anemia but has not  seen any gross blood in his stools. Appetite is poor. He was put on low-dose prednisone as an appetite stimulant. His cardiac status has remained stable. There is no change in chronic dyspnea on exertion. He is not having any ischemic type chest pain or palpitations. He has developed a atypical cough which only occurs in the supine position. He is not have any significant abdominal pain at this time.   Medications: reviewed  Allergies:  Allergies  Allergen Reactions  . Sulfa Antibiotics Other (See Comments)    Caused patient have ITP  . Aleve [Naproxen Sodium] Other (See Comments)    Causes platlet drop  . Rofecoxib Other (See Comments)    Celebrex  - affected low platelets  . Sulfamethoxazole-Trimethoprim Other (See Comments)     Causes low platelets and bleeding  . Tape Other (See Comments)    Adhesive tape makes skin tear.  . Tramadol Hcl Other (See Comments)    dizziness, drowsiness  . Neomycin-Bacitracin Zn-Polymyx Rash  . Neosporin [Neomycin-Polymyxin-Gramicidin] Rash  . Penicillins Swelling and Rash    Took dose of amoxicillin 2000mg  02/02/15 at dentist office without any complications    Review of Systems:  see history of present illness.  Remaining ROS negative:   Physical Exam: Blood pressure 119/61, pulse 72, temperature 97.5 F (36.4 C), temperature source Oral, height 5' 11.5" (1.816 m), SpO2 99 %. Wt Readings from Last 3 Encounters:  04/24/15 218 lb 12 oz (99.224 kg)  04/17/15 209 lb (94.802 kg)  04/05/15 207 lb (93.895 kg)     General appearance: elderly caucasian  male who has lost weight and muscle mass since last visit HENNT: Pharynx no erythema, exudate, mass, or ulcer. No thyromegaly or thyroid nodules Lymph nodes: No cervical, supraclavicular, or axillary lymphadenopathy Breasts:  Lungs: Clear to auscultation, resonant to percussion throughout Heart: Regular rhythm, no murmur, no gallop, no rub, no click, no edema Abdomen: Soft, nontender, normal bowel  sounds, no mass, no organomegaly. Healed scars from laparoscopic surgery Extremities: No edema, no calf tenderness Musculoskeletal: no joint deformities GU:  Vascular: Carotid pulses 2+, no bruits,  Neurologic: Alert, oriented, PERRL, cranial nerves grossly normal, motor strength 5 over 5, reflexes 1+ symmetric, upper body coordination normal, gait normal, Skin: No rash or ecchymosis  Lab Results: CBC W/Diff    Component Value Date/Time   WBC 4.4 04/25/2015 0929   WBC 9.6 04/16/2015 1148   RBC 2.95* 04/25/2015 0929   RBC 3.77* 04/16/2015 1148   RBC 3.18* 11/14/2014 1132   HGB 8.0* 04/25/2015 0929   HGB 10.6* 04/16/2015 1148   HCT 25.9* 04/25/2015 0929   HCT 34.2* 04/16/2015 1148   PLT 231 04/25/2015 0929   PLT 182 04/16/2015 1148   MCV 87.8 04/25/2015 0929   MCV 90.7 04/16/2015 1148   MCH 27.1* 04/25/2015 0929   MCH 28.1 04/16/2015 1148   MCHC 30.9* 04/25/2015 0929   MCHC 31.0 04/16/2015 1148   RDW 18.1* 04/25/2015 0929   RDW 18.3* 04/16/2015 1148   LYMPHSABS 0.9 04/25/2015 0929   LYMPHSABS 0.5* 04/16/2015 1148   MONOABS 0.6 04/25/2015 0929   MONOABS 0.3 04/16/2015 1148   EOSABS 0.0 04/25/2015 0929   EOSABS 0.0 04/16/2015 1148   BASOSABS 0.0 04/25/2015 0929   BASOSABS 0.0 04/16/2015 1148     Chemistry      Component Value Date/Time   NA 138 04/25/2015 0931   NA 133* 04/16/2015 0945   K 4.3 04/25/2015 0931   K 3.7 04/16/2015 0945   CL 111 04/16/2015 0945   CO2 16* 04/25/2015 0931   CO2 16* 04/16/2015 0945   BUN 15.3 04/25/2015 0931   BUN 23 04/16/2015 0945   CREATININE 1.0 04/25/2015 0931   CREATININE 1.07 04/16/2015 0945   CREATININE 1.12 02/13/2015 0925      Component Value Date/Time   CALCIUM 9.5 04/25/2015 0931   CALCIUM 7.8* 04/16/2015 0945   ALKPHOS 320* 04/25/2015 0931   ALKPHOS 212* 04/16/2015 0945   AST 37* 04/25/2015 0931   AST 34 04/16/2015 0945   ALT 76* 04/25/2015 0931   ALT 80* 04/16/2015 0945   BILITOT 0.89 04/25/2015 0931   BILITOT 1.3*  04/16/2015 0945       Radiological Studies: Ct Head Wo Contrast  04/12/2015   CLINICAL DATA:  81 year old male with altered mental status. Medical history includes gastric cancer metastatic to the liver and ongoing chemotherapy.  EXAM: CT HEAD WITHOUT CONTRAST  TECHNIQUE: Contiguous axial images were obtained from the base of the skull through the vertex without intravenous contrast.  COMPARISON:  Prior PET-CT 03/29/2015; prior brain MRI 01/06/2013; prior head CT 01/16/2011  FINDINGS: Negative for acute intracranial hemorrhage, acute infarction, mass, mass effect, hydrocephalus or midline shift. Gray-white differentiation is preserved throughout. Cerebral and cerebellar cortical volume loss. Mild chronic microvascular ischemic white matter disease. No focal soft tissue or calvarial abnormality. Symmetric globes bilaterally. Normal aeration of the mastoid air cells paranasal sinuses. Mild hyperostosis frontalis interna. Intracranial atherosclerosis.  IMPRESSION: No acute intracranial abnormality.  Stable atrophy and mild chronic microvascular ischemic white matter disease.   Electronically Signed  By: Jacqulynn Cadet M.D.   On: 04/12/2015 20:11   US Abdomen Limited  04/17/2015   CLINICAL DATA:  Duodenal adenocarcinoma.  Ascites.  EXAM: LIMITED ABDOMEN ULTRASOUND FOR ASCITES  TECHNIQUE: Limited ultrasound survey for ascites was performed in all four abdominal quadrants.  COMPARISON:  None.  FINDINGS: A very small amount of ascites is seen in both lower quadrants in the right upper quadrant. This is felt to be insufficient amount for paracentesis.  IMPRESSION: Small amount of ascites seen in 3 of 4 abdominal quadrants.   Electronically Signed   By: Earle Gell M.D.   On: 04/17/2015 11:10   Dg Chest Port 1 View  04/12/2015   CLINICAL DATA:  81 year old male with 1 day history of weakness and lethargy. Current clinical history includes gastric cancer metastatic to the liver. Patient received chemotherapy  yesterday.  EXAM: PORTABLE CHEST - 1 VIEW  COMPARISON:  Prior chest x-ray 04/05/2015  FINDINGS: Single-lumen power injectable port catheter via a right internal jugular venous approach. Catheter tip projects over the mid SVC. Mild cardiomegaly. Atherosclerotic calcifications present in the aorta. No focal airspace consolidation, pleural effusion or pneumothorax. No evidence of pulmonary edema. Inspiratory volumes are slightly low with mild bibasilar atelectasis. No acute osseous abnormality.  IMPRESSION: Low inspiratory volumes with minimal bibasilar atelectasis.  No acute cardiopulmonary process.   Electronically Signed   By: Jacqulynn Cadet M.D.   On: 04/12/2015 18:31   Dg Chest Portable 1 View  04/05/2015   CLINICAL DATA:  Postop from Port-A-Cath placement. Duodenal adenocarcinoma.  EXAM: PORTABLE CHEST - 1 VIEW  COMPARISON:  03/01/2015  FINDINGS: A right-sided Port-A-Cath is seen with tip overlying the distal SVC. No pneumothorax identified.  Both lungs are clear. Heart size is within normal limits. Mild tortuosity and atherosclerotic calcification of thoracic aorta appears stable.  IMPRESSION: Right-sided Port-A-Cath tip in distal SVC. No evidence of pneumothorax or other acute findings.   Electronically Signed   By: Earle Gell M.D.   On: 04/05/2015 14:07   Dg Fluoro Guide Cv Line-no Report  04/05/2015   CLINICAL DATA:    FLOURO GUIDE CV LINE  Fluoroscopy was utilized by the requesting physician.  No radiographic  interpretation.     Impression:  #1. 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 He will continue chemotherapy under the direction of Dr. Benay Spice.  #2. Recurrent GI bleed secondary to #1  #3. Acute bowel obstruction secondary to #1  #4. Recurrent thrombotic events secondary to #1 He is not currently on any anticoagulants.  #5. Underlying coronary artery disease status post drug eluting stent of the proximal LAD  06/22/2014  I told the patient that at the present time, the majority of his care will be provided by my colleague Dr. Benay Spice. I'm happy to help in any way but I did not schedule a formal revisit in this office. I think you will be just too much for him to come here for a visit if I don't have anything medically to offer him at this time.   CC: Patient Care Team: Tonia Ghent, MD as PCP - General [name], MD as Consulting Physician (Pulmonary Disease) Larey Dresser, MD as Consulting Physician (Cardiology) Annia Belt, MD as Consulting Physician (Oncology) Gatha Mayer, MD as Consulting Physician (Gastroenterology)   Annia Belt, MD 5/10/201610:55 AM

## 2015-05-02 NOTE — Patient Instructions (Signed)
Continue follow up with Dr Benay Spice at cancer center Return visit to Dr Beryle Beams as needed

## 2015-05-03 ENCOUNTER — Encounter: Payer: Self-pay | Admitting: Family Medicine

## 2015-05-03 ENCOUNTER — Other Ambulatory Visit: Payer: Self-pay | Admitting: Family Medicine

## 2015-05-03 DIAGNOSIS — E039 Hypothyroidism, unspecified: Secondary | ICD-10-CM

## 2015-05-03 MED ORDER — LEVOTHYROXINE SODIUM 25 MCG PO TABS: 25.0000 ug | ORAL_TABLET | Freq: Every day | ORAL | Status: DC

## 2015-05-04 ENCOUNTER — Encounter: Payer: Self-pay | Admitting: Family Medicine

## 2015-05-04 ENCOUNTER — Other Ambulatory Visit: Payer: Self-pay | Admitting: Family Medicine

## 2015-05-04 DIAGNOSIS — E039 Hypothyroidism, unspecified: Secondary | ICD-10-CM

## 2015-05-04 MED ORDER — LEVOTHYROXINE SODIUM 25 MCG PO TABS: 25.0000 ug | ORAL_TABLET | Freq: Every day | ORAL | Status: DC

## 2015-05-04 NOTE — Telephone Encounter (Signed)
V/M received want to ck on status of synthroid 25 mcg to [facility]; Gus at [facility] said rx is ready for pick up. Mr Gemme notified rx ready for pick up and he voiced understanding.

## 2015-05-05 ENCOUNTER — Telehealth: Payer: Self-pay | Admitting: Nurse Practitioner

## 2015-05-05 NOTE — Telephone Encounter (Signed)
S/w pt confirming labs moved up and ML added due to ML was double booked.... KJ

## 2015-05-07 ENCOUNTER — Other Ambulatory Visit: Payer: Self-pay | Admitting: Oncology

## 2015-05-08 ENCOUNTER — Other Ambulatory Visit (HOSPITAL_COMMUNITY): Payer: Self-pay | Admitting: Cardiology

## 2015-05-09 ENCOUNTER — Ambulatory Visit (HOSPITAL_BASED_OUTPATIENT_CLINIC_OR_DEPARTMENT_OTHER): Payer: Medicare Other | Admitting: Nurse Practitioner

## 2015-05-09 ENCOUNTER — Other Ambulatory Visit (HOSPITAL_BASED_OUTPATIENT_CLINIC_OR_DEPARTMENT_OTHER): Payer: Medicare Other

## 2015-05-09 ENCOUNTER — Telehealth: Payer: Self-pay | Admitting: *Deleted

## 2015-05-09 ENCOUNTER — Ambulatory Visit (HOSPITAL_BASED_OUTPATIENT_CLINIC_OR_DEPARTMENT_OTHER): Payer: Medicare Other

## 2015-05-09 ENCOUNTER — Telehealth: Payer: Self-pay | Admitting: Nurse Practitioner

## 2015-05-09 VITALS — BP 104/54 | HR 78 | Temp 97.8°F | Resp 18 | Ht 71.0 in | Wt 198.0 lb

## 2015-05-09 DIAGNOSIS — Z86711 Personal history of pulmonary embolism: Secondary | ICD-10-CM

## 2015-05-09 DIAGNOSIS — K644 Residual hemorrhoidal skin tags: Secondary | ICD-10-CM | POA: Diagnosis not present

## 2015-05-09 DIAGNOSIS — D6481 Anemia due to antineoplastic chemotherapy: Secondary | ICD-10-CM

## 2015-05-09 DIAGNOSIS — C787 Secondary malignant neoplasm of liver and intrahepatic bile duct: Secondary | ICD-10-CM

## 2015-05-09 DIAGNOSIS — C170 Malignant neoplasm of duodenum: Secondary | ICD-10-CM

## 2015-05-09 DIAGNOSIS — E860 Dehydration: Secondary | ICD-10-CM | POA: Diagnosis present

## 2015-05-09 DIAGNOSIS — D709 Neutropenia, unspecified: Secondary | ICD-10-CM | POA: Diagnosis not present

## 2015-05-09 DIAGNOSIS — G893 Neoplasm related pain (acute) (chronic): Secondary | ICD-10-CM

## 2015-05-09 DIAGNOSIS — Z86718 Personal history of other venous thrombosis and embolism: Secondary | ICD-10-CM | POA: Diagnosis not present

## 2015-05-09 DIAGNOSIS — Z85820 Personal history of malignant melanoma of skin: Secondary | ICD-10-CM | POA: Diagnosis not present

## 2015-05-09 DIAGNOSIS — R599 Enlarged lymph nodes, unspecified: Secondary | ICD-10-CM

## 2015-05-09 DIAGNOSIS — D500 Iron deficiency anemia secondary to blood loss (chronic): Secondary | ICD-10-CM

## 2015-05-09 LAB — COMPREHENSIVE METABOLIC PANEL (CC13)
ALT: 74 U/L — ABNORMAL HIGH (ref 0–55)
AST: 32 U/L (ref 5–34)
Albumin: 2.5 g/dL — ABNORMAL LOW (ref 3.5–5.0)
Alkaline Phosphatase: 273 U/L — ABNORMAL HIGH (ref 40–150)
Anion Gap: 16 mEq/L — ABNORMAL HIGH (ref 3–11)
BUN: 23.4 mg/dL (ref 7.0–26.0)
CO2: 18 mEq/L — ABNORMAL LOW (ref 22–29)
Calcium: 8.7 mg/dL (ref 8.4–10.4)
Chloride: 99 mEq/L (ref 98–109)
Creatinine: 1.4 mg/dL — ABNORMAL HIGH (ref 0.7–1.3)
EGFR: 45 mL/min/{1.73_m2} — ABNORMAL LOW (ref >90)
Glucose: 128 mg/dl (ref 70–140)
Potassium: 4.6 mEq/L (ref 3.5–5.1)
Sodium: 134 mEq/L — ABNORMAL LOW (ref 136–145)
Total Bilirubin: 1.37 mg/dL — ABNORMAL HIGH (ref 0.20–1.20)
Total Protein: 6.3 g/dL — ABNORMAL LOW (ref 6.4–8.3)

## 2015-05-09 LAB — CBC WITH DIFFERENTIAL/PLATELET
BASO%: 1 % (ref 0.0–2.0)
Basophils Absolute: 0 10*3/uL (ref 0.0–0.1)
EOS%: 0 % (ref 0.0–7.0)
Eosinophils Absolute: 0 10*3/uL (ref 0.0–0.5)
HCT: 34.2 % — ABNORMAL LOW (ref 38.4–49.9)
HGB: 11 g/dL — ABNORMAL LOW (ref 13.0–17.1)
LYMPH%: 28.1 % (ref 14.0–49.0)
MCH: 28 pg (ref 27.2–33.4)
MCHC: 32.2 g/dL (ref 32.0–36.0)
MCV: 87 fL (ref 79.3–98.0)
MONO#: 0.7 10*3/uL (ref 0.1–0.9)
MONO%: 36.7 % — ABNORMAL HIGH (ref 0.0–14.0)
NEUT#: 0.7 10*3/uL — ABNORMAL LOW (ref 1.5–6.5)
NEUT%: 34.2 % — ABNORMAL LOW (ref 39.0–75.0)
Platelets: 271 10*3/uL (ref 140–400)
RBC: 3.93 10*6/uL — ABNORMAL LOW (ref 4.20–5.82)
RDW: 17.9 % — ABNORMAL HIGH (ref 11.0–14.6)
WBC: 2 10*3/uL — ABNORMAL LOW (ref 4.0–10.3)
lymph#: 0.6 10*3/uL — ABNORMAL LOW (ref 0.9–3.3)
nRBC: 3 % — ABNORMAL HIGH (ref 0–0)

## 2015-05-09 LAB — TECHNOLOGIST REVIEW

## 2015-05-09 LAB — FERRITIN CHCC: Ferritin: 215 ng/ml (ref 22–316)

## 2015-05-09 LAB — HOLD TUBE, BLOOD BANK

## 2015-05-09 MED ORDER — SODIUM CHLORIDE 0.9 % IJ SOLN
10.0000 mL | INTRAMUSCULAR | Status: DC | PRN
  Administered 2015-05-09: 10 mL via INTRAVENOUS
  Filled 2015-05-09: qty 10

## 2015-05-09 MED ORDER — HEPARIN SOD (PORK) LOCK FLUSH 100 UNIT/ML IV SOLN
500.0000 [IU] | Freq: Once | INTRAVENOUS | Status: AC
  Administered 2015-05-09: 500 [IU] via INTRAVENOUS
  Filled 2015-05-09: qty 5

## 2015-05-09 MED ORDER — SODIUM CHLORIDE 0.9 % IV SOLN
1000.0000 mL | Freq: Once | INTRAVENOUS | Status: AC
  Administered 2015-05-09: 1000 mL via INTRAVENOUS

## 2015-05-09 NOTE — Patient Instructions (Signed)
Dehydration, Adult Dehydration is when you lose more fluids from the body than you take in. Vital organs like the kidneys, brain, and heart cannot function without a proper amount of fluids and salt. Any loss of fluids from the body can cause dehydration.  CAUSES   Vomiting.  Diarrhea.  Excessive sweating.  Excessive urine output.  Fever. SYMPTOMS  Mild dehydration  Thirst.  Dry lips.  Slightly dry mouth. Moderate dehydration  Very dry mouth.  Sunken eyes.  Skin does not bounce back quickly when lightly pinched and released.  Dark urine and decreased urine production.  Decreased tear production.  Headache. Severe dehydration  Very dry mouth.  Extreme thirst.  Rapid, weak pulse (more than 100 beats per minute at rest).  Cold hands and feet.  Not able to sweat in spite of heat and temperature.  Rapid breathing.  Blue lips.  Confusion and lethargy.  Difficulty being awakened.  Minimal urine production.  No tears. DIAGNOSIS  Your caregiver will diagnose dehydration based on your symptoms and your exam. Blood and urine tests will help confirm the diagnosis. The diagnostic evaluation should also identify the cause of dehydration. TREATMENT  Treatment of mild or moderate dehydration can often be done at home by increasing the amount of fluids that you drink. It is best to drink small amounts of fluid more often. Drinking too much at one time can make vomiting worse. Refer to the home care instructions below. Severe dehydration needs to be treated at the hospital where you will probably be given intravenous (IV) fluids that contain water and electrolytes. HOME CARE INSTRUCTIONS   Ask your caregiver about specific rehydration instructions.  Drink enough fluids to keep your urine clear or pale yellow.  Drink small amounts frequently if you have nausea and vomiting.  Eat as you normally do.  Avoid:  Foods or drinks high in sugar.  Carbonated  drinks.  Juice.  Extremely hot or cold fluids.  Drinks with caffeine.  Fatty, greasy foods.  Alcohol.  Tobacco.  Overeating.  Gelatin desserts.  Wash your hands well to avoid spreading bacteria and viruses.  Only take over-the-counter or prescription medicines for pain, discomfort, or fever as directed by your caregiver.  Ask your caregiver if you should continue all prescribed and over-the-counter medicines.  Keep all follow-up appointments with your caregiver. SEEK MEDICAL CARE IF:  You have abdominal pain and it increases or stays in one area (localizes).  You have a rash, stiff neck, or severe headache.  You are irritable, sleepy, or difficult to awaken.  You are weak, dizzy, or extremely thirsty. SEEK IMMEDIATE MEDICAL CARE IF:   You are unable to keep fluids down or you get worse despite treatment.  You have frequent episodes of vomiting or diarrhea.  You have blood or green matter (bile) in your vomit.  You have blood in your stool or your stool looks black and tarry.  You have not urinated in 6 to 8 hours, or you have only urinated a small amount of very dark urine.  You have a fever.  You faint. MAKE SURE YOU:   Understand these instructions.  Will watch your condition.  Will get help right away if you are not doing well or get worse. Document Released: 12/09/2005 Document Revised: 03/02/2012 Document Reviewed: 07/29/2011 ExitCare Patient Information 2015 [employer]. This information is not intended to replace advice given to you by your health care provider. Make sure you discuss any questions you have with your health care   provider.  

## 2015-05-09 NOTE — Progress Notes (Addendum)
[facility] OFFICE PROGRESS NOTE  Diagnosis:  Small bowel carcinoma  INTERVAL HISTORY:   James [name] returns as scheduled. He completed cycle 2 FOLFOX 04/25/2015. He denies nausea/vomiting. No mouth sores. He had loose stools for one day. He reports persistent cold sensitivity. He denies any "severe" pain. When he got up this morning he noted that his low back was "sore". Appetite is poor. He is losing weight. He denies fever. No bleeding. He is weak.  Objective:  Vital signs in last 24 hours:  Blood pressure 104/54, pulse 78, temperature 97.8 F (36.6 C), temperature source Oral, resp. rate 18, height 5\' 11"  (1.803 m), weight 198 lb (89.812 kg), SpO2 100 %.    HEENT: No thrush or ulcers. Resp: Clear bilaterally. Cardio: Regular rate and rhythm. GI: Abdomen is nontender. Fullness in the right abdomen. No definite mass. Vascular: No leg edema. Port-A-Cath without erythema.  Lab Results:  Lab Results  Component Value Date   WBC 2.0* 05/09/2015   HGB 11.0* 05/09/2015   HCT 34.2* 05/09/2015   MCV 87.0 05/09/2015   PLT 271 05/09/2015   NEUTROABS 0.7* 05/09/2015    Imaging:  No results found.  Medications: I have reviewed the patient's current medications.  Assessment/Plan: 1.Duodenal mass, abdominal/retroperitoneal lymphadenopathy, and liver metastases noted on a CT 03/01/2015  Upper endoscopy 03/03/2015 confirmed a duodenal mass, status post a biopsy with the final pathology confirming a carcinoma, immunohistochemical stains negative for lymphoma, neuroendocrine carcinoma, and melanoma markers. Positive staining for CDX2, cytokeratin AE1/AE3, and CD10  PET scan 03/29/2015 showed a large intensely hypermetabolic mass circumferentially narrowing the second portion of the duodenum. Adjacent hypermetabolic lymph nodes medial to the duodenum mass. Irregular hypermetabolic nodal mass in the inferior aspect of the gastrohepatic ligament. Hypermetabolic nodal  metastasis in the retroperitoneum left of the aorta. No abnormal metabolic activity within the enlarged inferior right lobe of the liver.  Cycle 1 FOLFOX 04/11/2015  Cycle 2 FOLFOX 04/25/2015  2. Gastric outlet obstruction secondary to #1, status post a palliative gastrojejunostomy 03/09/2015  3. 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 Portal vein thrombosis diagnosed in September 2015  5. Left leg DVT December 2014  6. History of coronary artery disease, status post an LAD drug-eluting stent July 2015  7. Remote history of melanoma of the right ear  8. 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 History of drug related ITP  10. Abdominal pain secondary to the duodenal mass/liver metastases/adenopathy  11. Neuropathy  12. Altered mental status 04/12/2015-resolved  13. External hemorrhoid-he will continue hemorrhoid creams, I will refer him to Dr. Excell Seltzer    Disposition: James [name] has completed 2 cycles of FOLFOX. He is neutropenic on labs today. We will hold today's treatment and reschedule for one week. He understands to contact the office with fever, chills, other signs of infection.  His performance status has declined over the past few weeks. He has lost a significant amount of weight. We discussed the difficulty in giving someone chemotherapy who has a poor performance status. He understands that we will reevaluate his overall condition in one week and decide if he is a candidate for additional chemotherapy versus a supportive care approach.  He appears to have some degree of dehydration at today's visit. He will receive 1  L of IV fluids.  He will return for a follow-up visit and possible  chemotherapy in one week. He will contact the office in the interim with any problems.  Patient seen with Dr. Benay Spice.    Ned Card ANP/GNP-BC   05/09/2015  1:24 PM   This was a shared visit with Ned Card. Chemotherapy will be held secondary to neutropenia and his poor performance status. He will return for an office visit in one week.  Julieanne Manson, M.D.

## 2015-05-09 NOTE — Telephone Encounter (Signed)
Per staff message and POF I have scheduled appts. Advised scheduler of appts. JMW  

## 2015-05-09 NOTE — [facility] Oncology Navigator Note (Signed)
Oncology Nurse Navigator Documentation  Oncology Nurse Navigator Flowsheets 05/09/2015  Navigator Encounter Type Other -1 month visit  Patient Visit Type Medonc  Treatment Phase Treatment  Barriers/Navigation Needs Family concerns,decline  Interventions Emotional support,fall precautions  Time Spent with Patient 15

## 2015-05-09 NOTE — Telephone Encounter (Signed)
Pt confirmed labs/ov per 05/17 POF, gave pt AVS and Calendar.... KJ, sent msg to MR to send records to [facility] Dr. Excell Seltzer urgent.Marland KitchenMarland Kitchen

## 2015-05-10 ENCOUNTER — Telehealth: Payer: Self-pay | Admitting: Oncology

## 2015-05-10 ENCOUNTER — Telehealth: Payer: Self-pay | Admitting: Nutrition

## 2015-05-10 NOTE — Telephone Encounter (Signed)
Patient's daughter, Jackelyn Poling, called and requested additional fact sheets for patient. Patient continues to have taste alterations Reports diarrhea is severe. Provided brief dietary education for patient to follow low fiber diet. Daughter asked me to email fact sheets to her so she could pass them on to family.

## 2015-05-10 NOTE — Telephone Encounter (Signed)
Faxed  Pt medical records to Dr. Excell Seltzer

## 2015-05-15 ENCOUNTER — Other Ambulatory Visit: Payer: Self-pay

## 2015-05-15 ENCOUNTER — Inpatient Hospital Stay (HOSPITAL_COMMUNITY)
Admission: EM | Admit: 2015-05-15 | Discharge: 2015-05-20 | DRG: 871 | Disposition: A | Discharge status: Home Health | Payer: Medicare Other | Attending: Internal Medicine | Admitting: Internal Medicine

## 2015-05-15 ENCOUNTER — Encounter (HOSPITAL_COMMUNITY): Payer: Self-pay | Admitting: Emergency Medicine

## 2015-05-15 ENCOUNTER — Emergency Department (HOSPITAL_COMMUNITY): Payer: Medicare Other

## 2015-05-15 ENCOUNTER — Other Ambulatory Visit (HOSPITAL_COMMUNITY): Payer: Self-pay

## 2015-05-15 DIAGNOSIS — M109 Gout, unspecified: Secondary | ICD-10-CM | POA: Diagnosis present

## 2015-05-15 DIAGNOSIS — Z79899 Other long term (current) drug therapy: Secondary | ICD-10-CM | POA: Diagnosis not present

## 2015-05-15 DIAGNOSIS — Z808 Family history of malignant neoplasm of other organs or systems: Secondary | ICD-10-CM | POA: Diagnosis not present

## 2015-05-15 DIAGNOSIS — E860 Dehydration: Secondary | ICD-10-CM | POA: Diagnosis present

## 2015-05-15 DIAGNOSIS — Z86718 Personal history of other venous thrombosis and embolism: Secondary | ICD-10-CM

## 2015-05-15 DIAGNOSIS — K311 Adult hypertrophic pyloric stenosis: Secondary | ICD-10-CM | POA: Diagnosis present

## 2015-05-15 DIAGNOSIS — Z880 Allergy status to penicillin: Secondary | ICD-10-CM | POA: Diagnosis not present

## 2015-05-15 DIAGNOSIS — C787 Secondary malignant neoplasm of liver and intrahepatic bile duct: Secondary | ICD-10-CM | POA: Diagnosis present

## 2015-05-15 DIAGNOSIS — I81 Portal vein thrombosis: Secondary | ICD-10-CM | POA: Diagnosis present

## 2015-05-15 DIAGNOSIS — Z885 Allergy status to narcotic agent status: Secondary | ICD-10-CM

## 2015-05-15 DIAGNOSIS — R339 Retention of urine, unspecified: Secondary | ICD-10-CM | POA: Diagnosis present

## 2015-05-15 DIAGNOSIS — D61810 Antineoplastic chemotherapy induced pancytopenia: Secondary | ICD-10-CM | POA: Diagnosis present

## 2015-05-15 DIAGNOSIS — Z87442 Personal history of urinary calculi: Secondary | ICD-10-CM

## 2015-05-15 DIAGNOSIS — E039 Hypothyroidism, unspecified: Secondary | ICD-10-CM | POA: Diagnosis present

## 2015-05-15 DIAGNOSIS — R0602 Shortness of breath: Secondary | ICD-10-CM | POA: Diagnosis present

## 2015-05-15 DIAGNOSIS — N183 Chronic kidney disease, stage 3 (moderate): Secondary | ICD-10-CM | POA: Diagnosis present

## 2015-05-15 DIAGNOSIS — Z85820 Personal history of malignant melanoma of skin: Secondary | ICD-10-CM

## 2015-05-15 DIAGNOSIS — Z882 Allergy status to sulfonamides status: Secondary | ICD-10-CM | POA: Diagnosis not present

## 2015-05-15 DIAGNOSIS — I2510 Atherosclerotic heart disease of native coronary artery without angina pectoris: Secondary | ICD-10-CM

## 2015-05-15 DIAGNOSIS — C189 Malignant neoplasm of colon, unspecified: Secondary | ICD-10-CM | POA: Diagnosis present

## 2015-05-15 DIAGNOSIS — J189 Pneumonia, unspecified organism: Secondary | ICD-10-CM

## 2015-05-15 DIAGNOSIS — R1110 Vomiting, unspecified: Secondary | ICD-10-CM | POA: Diagnosis not present

## 2015-05-15 DIAGNOSIS — L8990 Pressure ulcer of unspecified site, unspecified stage: Secondary | ICD-10-CM | POA: Diagnosis present

## 2015-05-15 DIAGNOSIS — Z823 Family history of stroke: Secondary | ICD-10-CM

## 2015-05-15 DIAGNOSIS — C170 Malignant neoplasm of duodenum: Secondary | ICD-10-CM | POA: Diagnosis present

## 2015-05-15 DIAGNOSIS — Z955 Presence of coronary angioplasty implant and graft: Secondary | ICD-10-CM | POA: Diagnosis not present

## 2015-05-15 DIAGNOSIS — M170 Bilateral primary osteoarthritis of knee: Secondary | ICD-10-CM | POA: Diagnosis present

## 2015-05-15 DIAGNOSIS — R410 Disorientation, unspecified: Secondary | ICD-10-CM | POA: Diagnosis not present

## 2015-05-15 DIAGNOSIS — E872 Acidosis: Secondary | ICD-10-CM | POA: Diagnosis present

## 2015-05-15 DIAGNOSIS — Z7952 Long term (current) use of systemic steroids: Secondary | ICD-10-CM | POA: Diagnosis not present

## 2015-05-15 DIAGNOSIS — G629 Polyneuropathy, unspecified: Secondary | ICD-10-CM | POA: Diagnosis present

## 2015-05-15 DIAGNOSIS — K766 Portal hypertension: Secondary | ICD-10-CM | POA: Diagnosis present

## 2015-05-15 DIAGNOSIS — A419 Sepsis, unspecified organism: Secondary | ICD-10-CM | POA: Diagnosis present

## 2015-05-15 DIAGNOSIS — J9601 Acute respiratory failure with hypoxia: Secondary | ICD-10-CM | POA: Diagnosis present

## 2015-05-15 DIAGNOSIS — Z833 Family history of diabetes mellitus: Secondary | ICD-10-CM

## 2015-05-15 DIAGNOSIS — Y95 Nosocomial condition: Secondary | ICD-10-CM | POA: Diagnosis present

## 2015-05-15 DIAGNOSIS — R001 Bradycardia, unspecified: Secondary | ICD-10-CM | POA: Diagnosis present

## 2015-05-15 DIAGNOSIS — Z9109 Other allergy status, other than to drugs and biological substances: Secondary | ICD-10-CM | POA: Diagnosis not present

## 2015-05-15 DIAGNOSIS — B954 Other streptococcus as the cause of diseases classified elsewhere: Secondary | ICD-10-CM | POA: Diagnosis present

## 2015-05-15 DIAGNOSIS — G8929 Other chronic pain: Secondary | ICD-10-CM | POA: Diagnosis present

## 2015-05-15 DIAGNOSIS — Z886 Allergy status to analgesic agent status: Secondary | ICD-10-CM | POA: Diagnosis not present

## 2015-05-15 DIAGNOSIS — I10 Essential (primary) hypertension: Secondary | ICD-10-CM | POA: Diagnosis not present

## 2015-05-15 DIAGNOSIS — I248 Other forms of acute ischemic heart disease: Secondary | ICD-10-CM | POA: Diagnosis present

## 2015-05-15 DIAGNOSIS — D899 Disorder involving the immune mechanism, unspecified: Secondary | ICD-10-CM | POA: Diagnosis present

## 2015-05-15 DIAGNOSIS — E780 Pure hypercholesterolemia: Secondary | ICD-10-CM | POA: Diagnosis present

## 2015-05-15 DIAGNOSIS — E46 Unspecified protein-calorie malnutrition: Secondary | ICD-10-CM | POA: Diagnosis present

## 2015-05-15 DIAGNOSIS — Z8672 Personal history of thrombophlebitis: Secondary | ICD-10-CM

## 2015-05-15 DIAGNOSIS — I129 Hypertensive chronic kidney disease with stage 1 through stage 4 chronic kidney disease, or unspecified chronic kidney disease: Secondary | ICD-10-CM | POA: Diagnosis present

## 2015-05-15 DIAGNOSIS — Z86010 Personal history of colonic polyps: Secondary | ICD-10-CM

## 2015-05-15 DIAGNOSIS — Z79891 Long term (current) use of opiate analgesic: Secondary | ICD-10-CM

## 2015-05-15 DIAGNOSIS — J690 Pneumonitis due to inhalation of food and vomit: Secondary | ICD-10-CM | POA: Diagnosis present

## 2015-05-15 DIAGNOSIS — Z888 Allergy status to other drugs, medicaments and biological substances status: Secondary | ICD-10-CM

## 2015-05-15 DIAGNOSIS — Z87891 Personal history of nicotine dependence: Secondary | ICD-10-CM | POA: Diagnosis not present

## 2015-05-15 DIAGNOSIS — N400 Enlarged prostate without lower urinary tract symptoms: Secondary | ICD-10-CM | POA: Diagnosis present

## 2015-05-15 DIAGNOSIS — Z800 Family history of malignant neoplasm of digestive organs: Secondary | ICD-10-CM

## 2015-05-15 DIAGNOSIS — T451X5A Adverse effect of antineoplastic and immunosuppressive drugs, initial encounter: Secondary | ICD-10-CM | POA: Diagnosis present

## 2015-05-15 DIAGNOSIS — Z881 Allergy status to other antibiotic agents status: Secondary | ICD-10-CM

## 2015-05-15 DIAGNOSIS — I959 Hypotension, unspecified: Secondary | ICD-10-CM | POA: Diagnosis not present

## 2015-05-15 DIAGNOSIS — N179 Acute kidney failure, unspecified: Secondary | ICD-10-CM | POA: Diagnosis present

## 2015-05-15 DIAGNOSIS — K219 Gastro-esophageal reflux disease without esophagitis: Secondary | ICD-10-CM | POA: Diagnosis present

## 2015-05-15 DIAGNOSIS — Z9861 Coronary angioplasty status: Secondary | ICD-10-CM

## 2015-05-15 DIAGNOSIS — R627 Adult failure to thrive: Secondary | ICD-10-CM | POA: Diagnosis present

## 2015-05-15 DIAGNOSIS — D638 Anemia in other chronic diseases classified elsewhere: Secondary | ICD-10-CM | POA: Diagnosis not present

## 2015-05-15 DIAGNOSIS — I5032 Chronic diastolic (congestive) heart failure: Secondary | ICD-10-CM | POA: Diagnosis present

## 2015-05-15 DIAGNOSIS — K644 Residual hemorrhoidal skin tags: Secondary | ICD-10-CM | POA: Diagnosis present

## 2015-05-15 DIAGNOSIS — K649 Unspecified hemorrhoids: Secondary | ICD-10-CM

## 2015-05-15 DIAGNOSIS — M545 Low back pain: Secondary | ICD-10-CM | POA: Diagnosis present

## 2015-05-15 DIAGNOSIS — D693 Immune thrombocytopenic purpura: Secondary | ICD-10-CM | POA: Diagnosis present

## 2015-05-15 DIAGNOSIS — R591 Generalized enlarged lymph nodes: Secondary | ICD-10-CM | POA: Diagnosis present

## 2015-05-15 DIAGNOSIS — Z8249 Family history of ischemic heart disease and other diseases of the circulatory system: Secondary | ICD-10-CM

## 2015-05-15 LAB — URINALYSIS, ROUTINE W REFLEX MICROSCOPIC
BILIRUBIN URINE: NEGATIVE
Glucose, UA: NEGATIVE mg/dL
Ketones, ur: NEGATIVE mg/dL
LEUKOCYTES UA: NEGATIVE
Nitrite: NEGATIVE
PH: 6.5 (ref 5.0–8.0)
Protein, ur: NEGATIVE mg/dL
Specific Gravity, Urine: 1.007 (ref 1.005–1.030)
Urobilinogen, UA: 0.2 mg/dL (ref 0.0–1.0)

## 2015-05-15 LAB — BRAIN NATRIURETIC PEPTIDE: B Natriuretic Peptide: 157.2 pg/mL — ABNORMAL HIGH (ref 0.0–100.0)

## 2015-05-15 LAB — BLOOD GAS, ARTERIAL
Acid-base deficit: 6.8 mmol/L — ABNORMAL HIGH (ref 0.0–2.0)
BICARBONATE: 14.7 meq/L — AB (ref 20.0–24.0)
DRAWN BY: 307971
O2 Content: 2 L/min
O2 Saturation: 95.2 %
Patient temperature: 98.6
TCO2: 13.2 mmol/L (ref 0–100)
pCO2 arterial: 19.3 mmHg — CL (ref 35.0–45.0)
pH, Arterial: 7.494 — ABNORMAL HIGH (ref 7.350–7.450)
pO2, Arterial: 78.9 mmHg — ABNORMAL LOW (ref 80.0–100.0)

## 2015-05-15 LAB — I-STAT CG4 LACTIC ACID, ED
LACTIC ACID, VENOUS: 3.49 mmol/L — AB (ref 0.5–2.0)
Lactic Acid, Venous: 5.16 mmol/L (ref 0.5–2.0)

## 2015-05-15 LAB — COMPREHENSIVE METABOLIC PANEL
ALT: 57 U/L (ref 17–63)
ANION GAP: 17 — AB (ref 5–15)
AST: 44 U/L — AB (ref 15–41)
Albumin: 2.6 g/dL — ABNORMAL LOW (ref 3.5–5.0)
Alkaline Phosphatase: 284 U/L — ABNORMAL HIGH (ref 38–126)
BUN: 21 mg/dL — AB (ref 6–20)
CO2: 15 mmol/L — ABNORMAL LOW (ref 22–32)
Calcium: 9 mg/dL (ref 8.9–10.3)
Chloride: 100 mmol/L — ABNORMAL LOW (ref 101–111)
Creatinine, Ser: 1.89 mg/dL — ABNORMAL HIGH (ref 0.61–1.24)
GFR calc Af Amer: 36 mL/min — ABNORMAL LOW (ref >60)
GFR calc non Af Amer: 31 mL/min — ABNORMAL LOW (ref >60)
Glucose, Bld: 148 mg/dL — ABNORMAL HIGH (ref 65–99)
POTASSIUM: 4.4 mmol/L (ref 3.5–5.1)
SODIUM: 132 mmol/L — AB (ref 135–145)
Total Bilirubin: 1.1 mg/dL (ref 0.3–1.2)
Total Protein: 6.9 g/dL (ref 6.5–8.1)

## 2015-05-15 LAB — CBC WITH DIFFERENTIAL/PLATELET
BASOS ABS: 0 10*3/uL (ref 0.0–0.1)
Basophils Relative: 0 % (ref 0–1)
EOS ABS: 0 10*3/uL (ref 0.0–0.7)
Eosinophils Relative: 0 % (ref 0–5)
HEMATOCRIT: 36 % — AB (ref 39.0–52.0)
Hemoglobin: 11.4 g/dL — ABNORMAL LOW (ref 13.0–17.0)
Lymphocytes Relative: 2 % — ABNORMAL LOW (ref 12–46)
Lymphs Abs: 0.6 10*3/uL — ABNORMAL LOW (ref 0.7–4.0)
MCH: 27.9 pg (ref 26.0–34.0)
MCHC: 31.7 g/dL (ref 30.0–36.0)
MCV: 88.2 fL (ref 78.0–100.0)
Monocytes Absolute: 1.8 10*3/uL — ABNORMAL HIGH (ref 0.1–1.0)
Monocytes Relative: 6 % (ref 3–12)
Neutro Abs: 28.1 10*3/uL — ABNORMAL HIGH (ref 1.7–7.7)
Neutrophils Relative %: 92 % — ABNORMAL HIGH (ref 43–77)
PLATELETS: 438 10*3/uL — AB (ref 150–400)
RBC: 4.08 MIL/uL — ABNORMAL LOW (ref 4.22–5.81)
RDW: 18.9 % — AB (ref 11.5–15.5)
WBC: 30.5 10*3/uL — ABNORMAL HIGH (ref 4.0–10.5)

## 2015-05-15 LAB — URINE MICROSCOPIC-ADD ON

## 2015-05-15 LAB — TROPONIN I: Troponin I: 0.06 ng/mL — ABNORMAL HIGH (ref <0.031)

## 2015-05-15 MED ORDER — LIDOCAINE-PRILOCAINE 2.5-2.5 % EX CREA: 1.0000 "application " | TOPICAL_CREAM | CUTANEOUS | Status: DC | PRN

## 2015-05-15 MED ORDER — CEFEPIME HCL 2 G IJ SOLR
2.0000 g | INTRAMUSCULAR | Status: AC
  Administered 2015-05-15: 2 g via INTRAVENOUS
  Filled 2015-05-15: qty 2

## 2015-05-15 MED ORDER — SODIUM CHLORIDE 0.9 % IV SOLN
INTRAVENOUS | Status: DC
  Administered 2015-05-16 (×2): via INTRAVENOUS

## 2015-05-15 MED ORDER — DEXTROSE 5 % IV SOLN
2.0000 g | Freq: Two times a day (BID) | INTRAVENOUS | Status: DC
  Administered 2015-05-16 – 2015-05-18 (×5): 2 g via INTRAVENOUS
  Filled 2015-05-15 (×6): qty 2

## 2015-05-15 MED ORDER — VANCOMYCIN HCL 10 G IV SOLR
1250.0000 mg | INTRAVENOUS | Status: AC
  Administered 2015-05-15: 1250 mg via INTRAVENOUS
  Filled 2015-05-15: qty 1250

## 2015-05-15 MED ORDER — HEPARIN SODIUM (PORCINE) 5000 UNIT/ML IJ SOLN
5000.0000 [IU] | Freq: Three times a day (TID) | INTRAMUSCULAR | Status: DC
  Administered 2015-05-16 – 2015-05-18 (×8): 5000 [IU] via SUBCUTANEOUS
  Filled 2015-05-15 (×8): qty 1

## 2015-05-15 MED ORDER — LEVOTHYROXINE SODIUM 25 MCG PO TABS
25.0000 ug | ORAL_TABLET | Freq: Every day | ORAL | Status: DC
  Administered 2015-05-17 – 2015-05-18 (×2): 25 ug via ORAL
  Filled 2015-05-15 (×2): qty 1

## 2015-05-15 MED ORDER — SODIUM CHLORIDE 0.9 % IV SOLN
Freq: Once | INTRAVENOUS | Status: DC
  Administered 2015-05-15: 21:00:00 via INTRAVENOUS

## 2015-05-15 MED ORDER — HYDROCORTISONE NA SUCCINATE PF 100 MG IJ SOLR
50.0000 mg | Freq: Three times a day (TID) | INTRAMUSCULAR | Status: DC
  Administered 2015-05-16 – 2015-05-19 (×11): 50 mg via INTRAVENOUS
  Filled 2015-05-15 (×11): qty 2

## 2015-05-15 MED ORDER — VANCOMYCIN HCL 10 G IV SOLR
1250.0000 mg | INTRAVENOUS | Status: DC
  Administered 2015-05-16: 1250 mg via INTRAVENOUS
  Filled 2015-05-15 (×2): qty 1250

## 2015-05-15 MED ORDER — PANTOPRAZOLE SODIUM 40 MG IV SOLR
40.0000 mg | Freq: Two times a day (BID) | INTRAVENOUS | Status: DC
  Administered 2015-05-16 – 2015-05-20 (×10): 40 mg via INTRAVENOUS
  Filled 2015-05-15 (×10): qty 40

## 2015-05-15 MED ORDER — SODIUM CHLORIDE 0.9 % IV BOLUS (SEPSIS)
500.0000 mL | Freq: Once | INTRAVENOUS | Status: AC
  Administered 2015-05-15: 500 mL via INTRAVENOUS

## 2015-05-15 MED ORDER — HYDROCORTISONE 2.5 % RE CREA: TOPICAL_CREAM | Freq: Two times a day (BID) | RECTAL | Status: DC | PRN

## 2015-05-15 MED ORDER — SODIUM CHLORIDE 0.9 % IV BOLUS (SEPSIS)
1000.0000 mL | Freq: Once | INTRAVENOUS | Status: AC
  Administered 2015-05-15: 1000 mL via INTRAVENOUS

## 2015-05-15 NOTE — ED Notes (Signed)
Per ems  Pt from home , co emesis , denies weakness. Hx colon cancer. Also co dizziness. Pt is alert and oriented x 4.

## 2015-05-15 NOTE — H&P (Signed)
[facility] History and Physical  Patient: James [name]  MRN: [id number]  DOB: 1928-01-06  DOS: the patient was seen and examined on 05/15/2015 PCP: [name], MD  Referring physician: Serita Grit, MD Chief Complaint: Vomiting and shortness of breath  HPI: James [name] is a 81 y.o. male with Past medical history of duodenal carcinoma with liver metastasis and admitted to peritoneal lymph nodes status post 2 cycles of FOLFOX and palliative gastrojejunostomy, chronic diastolic heart failure, portal vein thrombosis, coronary artery disease, left leg DVT, not on any anticoagulation or any antiplatelets medications at present, transfusion dependent anemia secondary to chronic GI bleeding from hemorrhoids, hypothyroidism, neuropathy, ITP . The patient is presenting with complaints of vomiting followed by shortness of breath. The patient was at his baseline when he woke up at this morning and did not have any acute complaints until now. His pain is well controlled with current regimen and he does not take the pain medications that often. While he was sleeping today on the bed he was offered to drink a glass of water and while drinking he had a large episode of emesis one episode without any blood. After that he started having complaints of shortness of breath and was feeling weak and lethargic and therefore they called the EMS. EMS found the patient hypoxic and put him on oxygen approximately here. He is the patient started developing fever as well as chills. A she denies any complaints of abdominal pain, nausea, diarrhea, constipation, burning urination, chest pain, active bleeding, black color bowel movement. He does not have any rash anywhere. No swelling of the legs reported. Recently he was started on Synthroid. Rest of the medications are old. I do not find any evidence of Neupogen or Neulasta given to him. His third cycle of chemotherapy was On hold recently due to  neutropenia.  The patient is coming from home. And at his baseline independent for most of his ADL.  Review of Systems: as mentioned in the history of present illness.  A comprehensive review of the other systems is negative.  Past Medical History  Diagnosis Date  . Bradycardia     Hypertensive  . carotid bruit, right   . hypercholesterolemia     Trig 234/293;takes Simcor daily  . Obesity, morbid   . Vasculitis   . 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 Beryle Beams; Dr. Beryle Beams , normal platelets  . Dermatitis     legs  . Bleeding gums   . Chronic low back pain   . Myalgia and myositis   . Unspecified vitamin D deficiency   . Bladder diverticulum   . Primary osteoarthritis of both knees   . Nephrolithiasis   . History of elevated glucose   . Thrombophlebitis     right forearm  . History of BPH   . History of colonic polyps     [name]  . Epididymitis, left     S/P Epidimectomy  . Splenomegaly 05/15/09    abd U/S NML  . Hypertension, benign     takes Micardis daily  . Neuropathy     acute  . Bruises easily   . H/O hiatal hernia   . GERD (gastroesophageal reflux disease)     hx of but doesn't take any meds now  . Urinary frequency   . Urinary urgency   . Nocturia   . Urinary leakage   . Abnormality of gait 03/19/2013  . DVT of lower limb, acute  11/19/2013    Peroneal & distal tibial left 11/01/13  . Polio 1941    "mild case"  . CHF (congestive heart failure)     "low grade" (06/22/2014)  . DVT (deep venous thrombosis) 10/2013    LLE  . Pneumonia, bacterial     RML resolved, spirometry, normal  . Pneumonia 1933  . History of blood transfusion 05/2014  . Anemia 05/2014    "related to Xarelto"  . Iron deficiency anemia 05/2014    "got iron infusion"  . Arthritis     "all over"  . Gout   . Malignant melanoma of ear     right  . Thrombophlebitis of superficial veins of right lower extremity 06/23/2014    Incidental  finding doppler 05/30/14  . Shortness of breath   . Esophageal varices without mention of bleeding 08/08/2014  . Portal vein thrombosis   . Duodenal cancer    Past Surgical History  Procedure Laterality Date  . Cystoscopy w/ stone manipulation  1950's  . Fem cutaneous nerve entrapment  1977    due to surgery (mild lat anesth of bilat legs)  . Limited pelvis/hip bone scan  04/99    negative  . Bone scan  04/00  . Cataract extraction w/ intraocular lens  implant, bilateral Bilateral ~ 2004  . Melanoma excision Right 06/04    ear; Wide local excision,,with flap  . Stress cardiolite  05/11/04    mild inf. ischemia, EF 71%  . Shoulder surgery Left 06/17/2005    "tore it all to pieces when I fell; not fractured"  . Bronchoscopy  09/08    RML collapse with chronic pneumonia, bx neg (Dr. Joya Gaskins)  . Cyst excision Right 03/07/09    middle finger, DIP Joint Mucoid (Dr. Fredna Dow)  . Eyelid & eyebrow lift  1996  . Cyst excision Right 08/15/09    middle finger, DIP Joint Mucoid Cyst (Dr. Fredna Dow)  . Appendectomy  1950  . Excision of mucoid tumor    . Colonoscopy    . Total knee arthroplasty  01/10/2012    Procedure: TOTAL KNEE ARTHROPLASTY;  Surgeon: Augustin Schooling, MD;  Location: [place];  Service: Orthopedics;  Laterality: Left;  . Surgery scrotal / testicular Left ~ 2000    removal of mass, noncancerous  . Cardiac catheterization  05/23/04    mild plague-statin  . Coronary angioplasty with stent placement  06/22/2014    to proximal LAD  . Inguinal hernia repair Bilateral 1959  . Inguinal hernia repair Left 1970's  . Esophagogastroduodenoscopy N/A 08/08/2014    Procedure: ESOPHAGOGASTRODUODENOSCOPY (EGD);  Surgeon: Gatha Mayer, MD;  Location: [facility] ENDOSCOPY;  Service: Endoscopy;  Laterality: N/A;  . Colonoscopy N/A 08/08/2014    Procedure: COLONOSCOPY;  Surgeon: Gatha Mayer, MD;  Location: [place];  Service: Endoscopy;  Laterality: N/A;  . Flexible sigmoidoscopy N/A 09/21/2014     Procedure: FLEXIBLE SIGMOIDOSCOPY;  Surgeon: Jerene Bears, MD;  Location: [place];  Service: Gastroenterology;  Laterality: N/A;  . Givens capsule study N/A 09/21/2014    Procedure: GIVENS CAPSULE STUDY;  Surgeon: Jerene Bears, MD;  Location: [facility] ENDOSCOPY;  Service: Endoscopy;  Laterality: N/A;  . Left and right heart catheterization with coronary angiogram N/A 06/22/2014    Procedure: LEFT AND RIGHT HEART CATHETERIZATION WITH CORONARY ANGIOGRAM;  Surgeon: Larey Dresser, MD;  Location: [facility] CATH LAB;  Service: Cardiovascular;  Laterality: N/A;  . Percutaneous coronary stent intervention (pci-s)  06/22/2014    Procedure:  PERCUTANEOUS CORONARY STENT INTERVENTION (PCI-S);  Surgeon: Larey Dresser, MD;  Location: [facility] CATH LAB;  Service: Cardiovascular;;  . Colonoscopy N/A 01/25/2015    Procedure: COLONOSCOPY;  Surgeon: Gatha Mayer, MD;  Location: [facility] ENDOSCOPY;  Service: Endoscopy;  Laterality: N/A;  . Cystoscopy with biopsy Right 02/03/2015    Procedure: CYSTOSCOPY WITH COLD CUP BLADDER BIOPSY CAUTHERIZAITON OF RIGHT BLADDER CANCER AND SATILITE, TURBT RIGHT BLADDER BASE;  Surgeon: Ailene Rud, MD;  Location: [facility] ORS;  Service: Urology;  Laterality: Right;  . Esophagogastroduodenoscopy N/A 03/03/2015    Procedure: ESOPHAGOGASTRODUODENOSCOPY (EGD);  Surgeon: Jerene Bears, MD;  Location: [facility] ENDOSCOPY;  Service: Gastroenterology;  Laterality: N/A;  . Gastrojejunostomy N/A 03/09/2015    Procedure: LAPAROSCOPIC GASTROJEJUNOSTOMY;  Surgeon: Excell Seltzer, MD;  Location: [facility] ORS;  Service: General;  Laterality: N/A;  . Portacath placement Right 04/05/2015    Procedure: INSERTION PORT-A-CATH;  Surgeon: Excell Seltzer, MD;  Location: [place];  Service: General;  Laterality: Right;  . Esophagogastroduodenoscopy N/A 04/14/2015    Procedure: ESOPHAGOGASTRODUODENOSCOPY (EGD);  Surgeon: Jerene Bears, MD;  Location: [facility] ENDOSCOPY;  Service: Endoscopy;  Laterality: N/A;  bedside case    Social History:  reports that he quit smoking about 47 years ago. His smoking use included Cigarettes and Cigars. He has a 40 pack-year smoking history. He has never used smokeless tobacco. He reports that he does not drink alcohol or use illicit drugs.  Allergies  Allergen Reactions  . Sulfa Antibiotics Other (See Comments)    Caused patient have ITP  . Aleve [Naproxen Sodium] Other (See Comments)    Causes platlet drop  . Rofecoxib Other (See Comments)    Celebrex  - affected low platelets  . Sulfamethoxazole-Trimethoprim Other (See Comments)     Causes low platelets and bleeding  . Tape Other (See Comments)    Adhesive tape makes skin tear.  . Tramadol Hcl Other (See Comments)    dizziness, drowsiness  . Neomycin-Bacitracin Zn-Polymyx Rash  . Neosporin [Neomycin-Polymyxin-Gramicidin] Rash  . Penicillins Swelling and Rash    Took dose of amoxicillin 2000mg  02/02/15 at dentist office without any complications    Family History  Problem Relation Age of Onset  . Thyroid cancer Mother   . Diabetes Father   . Heart disease Father   . Stroke Brother     cerebral hemm  . Colon cancer Brother   . Anesthesia problems Neg Hx   . Hypotension Neg Hx   . Malignant hyperthermia Neg Hx   . Pseudochol deficiency Neg Hx   . Prostate cancer Neg Hx   . Heart attack Father     Prior to Admission medications   Medication Sig Start Date End Date Taking? Authorizing Provider  benzonatate (TESSALON) 200 MG capsule Take 1 capsule (200 mg total) by mouth 3 (three) times daily as needed. Patient taking differently: Take 200 mg by mouth 3 (three) times daily as needed for cough.  04/24/15  Yes Tonia Ghent, MD  Cholecalciferol (VITAMIN D3) 2000 UNITS capsule Take 2,000 Units by mouth every morning.    Yes Historical Provider, MD  dibucaine (NUPERCAINAL) 1 % ointment Apply topically 3 (three) times daily as needed for pain. 04/24/15  Yes Tonia Ghent, MD  feeding supplement, ENSURE ENLIVE,  (ENSURE ENLIVE) LIQD Take 237 mLs by mouth 3 (three) times daily between meals. 03/16/15  Yes Debbe Odea, MD  finasteride (PROSCAR) 5 MG tablet Take 5 mg by mouth daily.   Yes Historical  Provider, MD  furosemide (LASIX) 40 MG tablet Take 0.5 tablets (20 mg total) by mouth daily. 04/17/15  Yes Oswald Hillock, MD  hydrocortisone (ANUSOL-HC) 2.5 % rectal cream Apply topically 2 (two) times daily. Patient taking differently: Apply topically 2 (two) times daily as needed for hemorrhoids or itching.  04/17/15  Yes Oswald Hillock, MD  levothyroxine (LEVOTHROID) 25 MCG tablet Take 1 tablet (25 mcg total) by mouth daily before breakfast. 05/04/15  Yes Tonia Ghent, MD  lidocaine-prilocaine (EMLA) cream Apply 1 application topically as needed. 04/06/15  Yes Owens Shark, NP  LIVALO 4 MG TABS TAKE 1 TABLET AT BEDTIME 10/03/14  Yes Jolaine Artist, MD  loperamide (IMODIUM A-D) 2 MG tablet Take 1 tablet (2 mg total) by mouth 2 (two) times daily as needed for diarrhea or loose stools. 04/24/15  Yes Tonia Ghent, MD  Multiple Vitamin (MULTIVITAMIN WITH MINERALS) TABS tablet Take 1 tablet by mouth daily.   Yes Historical Provider, MD  OVER THE COUNTER MEDICATION Place 2 drops into both eyes 4 (four) times daily. Wash type eye drop-   Yes Historical Provider, MD  oxyCODONE-acetaminophen (ROXICET) 5-325 MG per tablet Take 1 tablet by mouth every 4 (four) hours as needed for severe pain. 03/21/15  Yes Ladell Pier, MD  pantoprazole (PROTONIX) 40 MG tablet TAKE 1 TABLET DAILY Patient taking differently: TAKE 1 TABLET TWICE DAILY 05/08/15  Yes Larey Dresser, MD  POLY-IRON 150 FORTE 244-01-0 MG-MCG-MG CAPS TAKE 1 CAPSULE TWICE A DAY 05/08/15  Yes Larey Dresser, MD  pramoxine (PROCTOFOAM) 1 % foam Place 1 application rectally 3 (three) times daily as needed for itching. 04/24/15  Yes Tonia Ghent, MD  predniSONE (DELTASONE) 5 MG tablet Take 1 tablet (5 mg total) by mouth daily with breakfast. 03/31/15  Yes Owens Shark, NP  Probiotic Product (PROBIOTIC & ACIDOPHILUS EX ST PO) Take 1 tablet by mouth every morning.    Yes Historical Provider, MD  prochlorperazine (COMPAZINE) 5 MG tablet Take 1 tablet (5 mg total) by mouth every 6 (six) hours as needed for nausea or vomiting. 03/21/15  Yes Ladell Pier, MD  Psyllium (METAMUCIL PO) Take 20 mLs by mouth daily as needed (constipation).    Yes Historical Provider, MD  spironolactone (ALDACTONE) 25 MG tablet Take 0.5 tablets (12.5 mg total) by mouth daily. 04/17/15  Yes Oswald Hillock, MD  KLOR-CON M20 20 MEQ tablet TAKE 1 TABLET DAILY Patient not taking: Reported on 05/15/2015 05/08/15   Larey Dresser, MD  nitroGLYCERIN (NITROSTAT) 0.4 MG SL tablet Place 1 tablet (0.4 mg total) under the tongue every 5 (five) minutes as needed. For chest pain Patient not taking: Reported on 05/15/2015 07/07/14   Larey Dresser, MD    Physical Exam: Filed Vitals:   05/15/15 2128 05/15/15 2129 05/15/15 2200 05/15/15 2300  BP: 112/48 112/48 95/46 107/47  Pulse: 64 64 61 59  Temp:      TempSrc:      Resp: 17 19 20 21   SpO2: 100% 99% 100% 100%    General: Alert, Awake and Oriented to Time, Place and Person. Appear in mild distress Eyes: PERRL ENT: Oral Mucosa clear moist Neck: no JVD Cardiovascular: S1 and S2 Present, no Murmur, Peripheral Pulses Present Respiratory: Bilateral Air entry equal and Decreased,  basal Crackles, no wheezes Abdomen: Bowel Sound present, Soft and non tender Skin: no Rash Extremities: no Pedal edema, no calf tenderness Neurologic: Grossly no focal  neuro deficit.  Labs on Admission:  CBC:  Recent Labs Lab 05/09/15 1004 05/15/15 1643  WBC 2.0* 30.5*  NEUTROABS 0.7* 28.1*  HGB 11.0* 11.4*  HCT 34.2* 36.0*  MCV 87.0 88.2  PLT 271 438*    CMP     Component Value Date/Time   NA 132* 05/15/2015 1643   NA 134* 05/09/2015 1004   K 4.4 05/15/2015 1643   K 4.6 05/09/2015 1004   CL 100* 05/15/2015 1643   CO2 15* 05/15/2015 1643   CO2  18* 05/09/2015 1004   GLUCOSE 148* 05/15/2015 1643   GLUCOSE 128 05/09/2015 1004   BUN 21* 05/15/2015 1643   BUN 23.4 05/09/2015 1004   CREATININE 1.89* 05/15/2015 1643   CREATININE 1.4* 05/09/2015 1004   CREATININE 1.12 02/13/2015 0925   CALCIUM 9.0 05/15/2015 1643   CALCIUM 8.7 05/09/2015 1004   PROT 6.9 05/15/2015 1643   PROT 6.3* 05/09/2015 1004   ALBUMIN 2.6* 05/15/2015 1643   ALBUMIN 2.5* 05/09/2015 1004   AST 44* 05/15/2015 1643   AST 32 05/09/2015 1004   ALT 57 05/15/2015 1643   ALT 74* 05/09/2015 1004   ALKPHOS 284* 05/15/2015 1643   ALKPHOS 273* 05/09/2015 1004   BILITOT 1.1 05/15/2015 1643   BILITOT 1.37* 05/09/2015 1004   GFRNONAA 31* 05/15/2015 1643   GFRNONAA 63 10/17/2014 0931   GFRAA 36* 05/15/2015 1643   GFRAA 73 10/17/2014 0931    No results for input(s): LIPASE, AMYLASE in the last 168 hours.   Recent Labs Lab 05/15/15 1643  TROPONINI 0.06*   BNP (last 3 results)  Recent Labs  05/15/15 1644  BNP 157.2*    ProBNP (last 3 results)  Recent Labs  06/06/14 1244 11/29/14 0944 03/22/15 1513  PROBNP 83.0 114.6 71.0     Radiological Exams on Admission: Dg Chest 2 View  05/15/2015   CLINICAL DATA:  Pt from home, complains of emesis and dizziness, denies weakness. Hx colon cancer.  EXAM: CHEST  2 VIEW  COMPARISON:  Cough 04/12/2015  FINDINGS: Cardiac silhouette mildly enlarged. No mediastinal or hilar masses or evidence of adenopathy.  Minor reticular scarring in the antral lateral lung bases. This is unchanged. No lung consolidation or edema. No pleural effusion or pneumothorax.  Right anterior chest wall Port-A-Cath is stable.  Bony thorax is intact.  IMPRESSION: No acute cardiopulmonary disease.   Electronically Signed   By: Lajean Manes M.D.   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 Aspiration pneumonia with sepsis The patient is presenting with complaints of shortness of breath and cough. Workup shows clear chest x-ray with the patient was significantly hyperventilating based on the ABG and he has significant leukocytosis along with evidence of dehydration as well as lactic acid elevation suggesting SIRS, possible aspiration pneumonia as an etiology. With this chemotherapy status I would treat him broadly as a healthcare associated pneumonia. Patient has already received vancomycin and cefepime and he has taken M oxacillin multiple times in the past without any side effect or reaction. Patient will be continued on cefepime as per my discussion with pharmacy along with vancomycin. This was informed to the family. Patient  will be closely monitored in stepdown unit. We will recheck lactic acid level again in the morning. Holding off on aggressive hydration in the setting of chronic diastolic heart failure. Due to his chronic use of prednisone I would put him on stress dose hydrocortisone. Patient remains nothing by mouth until speech evaluation in the morning.  2.Chronic diastolic heart failure. Holding Diurtics at present.  3. shortness of breath with hypoxia. Possibility of PE cannot be ruled out. We will get VQ scan in the morning. With patient's status of chronic pancytopenia, anemia as well as ITP and with findings of leukocytosis as well as fever with suspicion for aspiration pneumonia high; at present holding off on empiric treatment for DVT.  4. history of GI bleed and GERD. Continue Protonix IV.  5. acute kidney injury. Probably secondary to dehydration and sepsis. Gentle IV hydration and holding off on oral Diuretics  Advance goals of care discussion: Full code as per my discussion with  patient. Patient's daughter will be his power of attorney.   DVT Prophylaxis: subcutaneous Heparin  Nutrition: npo  Family Communication: family was present at bedside, opportunity was given to ask question and all questions were answered satisfactorily at the time of interview. Disposition: Admitted as inpatient, step-down unit.  Author: Berle Mull, MD [facility] Pager: [phone] 05/15/2015  If 7PM-7AM, please contact night-coverage [email or web address] Password [email or web address]

## 2015-05-15 NOTE — ED Notes (Signed)
Bed: WA01 Expected date:  Expected time:  Means of arrival:  Comments: EMS-vomitting

## 2015-05-15 NOTE — ED Notes (Signed)
Notified EDP [place] of lactic acid result

## 2015-05-15 NOTE — ED Notes (Signed)
Pt could not give urine at this time

## 2015-05-15 NOTE — Progress Notes (Signed)
EDCM spoke to patient and his daughter Jeannene Patella [phone].  Patient lives with his daughter Jeannene Patella.  Patient reports he also has six sisters who take turns taking care of him.  Patient confirms his pcp is Dr. Derrill Center.  Patient recently admitted and discharged from 04/20 to 04/25. Patient has had a visit with his pcp since his discharge from the hospital.  Patient reports he received discharge follow up call.  Patient does not have home health services at this time.  Patient has had [place] int he past but had it cancelled.  Patient's daughter reports she was doing everything [facility] was doing so they called Dr. Learta Codding and cancelled it.  Patient reports his home is handicapped compatible.  Patient has a walker and cane at home.  Patient noted to be wearing oxygen in the ED.  Patient does not wear oxygen at home.  Patient reports he requires assistance with ADL's.  No further EDCM needs at this time.

## 2015-05-15 NOTE — Progress Notes (Signed)
ANTIBIOTIC CONSULT NOTE - INITIAL  Pharmacy Consult for Vancomycin, Cefepime Indication: rule out sepsis  Allergies  Allergen Reactions  . Sulfa Antibiotics Other (See Comments)    Caused patient have ITP  . Aleve [Naproxen Sodium] Other (See Comments)    Causes platlet drop  . Rofecoxib Other (See Comments)    Celebrex  - affected low platelets  . Sulfamethoxazole-Trimethoprim Other (See Comments)     Causes low platelets and bleeding  . Tape Other (See Comments)    Adhesive tape makes skin tear.  . Tramadol Hcl Other (See Comments)    dizziness, drowsiness  . Neomycin-Bacitracin Zn-Polymyx Rash  . Neosporin [Neomycin-Polymyxin-Gramicidin] Rash  . Penicillins Swelling and Rash    Took dose of amoxicillin 2000mg  02/02/15 at dentist office without any complications    Patient Measurements:   Weight 89.8kg  Vital Signs: Temp: 97.7 F (36.5 C) (05/23 1651) Temp Source: Oral (05/23 1651) BP: 89/48 mmHg (05/23 1718) Pulse Rate: 93 (05/23 1718) Intake/Output from previous day:   Intake/Output from this shift:    Labs:  Recent Labs  05/15/15 1643  WBC 30.5*  HGB 11.4*  PLT 438*  CREATININE 1.89*   Estimated Creatinine Clearance: 31 mL/min (by C-G formula based on Cr of 1.89). No results for input(s): VANCOTROUGH, VANCOPEAK, VANCORANDOM, GENTTROUGH, GENTPEAK, GENTRANDOM, TOBRATROUGH, TOBRAPEAK, TOBRARND, AMIKACINPEAK, AMIKACINTROU, AMIKACIN in the last 72 hours.   Microbiology: Recent Results (from the past 720 hour(s))  TECHNOLOGIST REVIEW     Status: None   Collection Time: 05/09/15 10:04 AM  Result Value Ref Range Status   Technologist Review Variant lymphs, few giant plts present  Final    Medical History: Past Medical History  Diagnosis Date  . Bradycardia     Hypertensive  . carotid bruit, right   . hypercholesterolemia     Trig 234/293;takes Simcor daily  . Obesity, morbid   . Vasculitis   . Immune thrombocytopenic purpura 5/20-5/27/2010; 2011   Hosp ITP severe, Dr. Beryle Beams; Dr. Beryle Beams , normal platelets  . Dermatitis     legs  . Bleeding gums   . Chronic low back pain   . Myalgia and myositis   . Unspecified vitamin D deficiency   . Bladder diverticulum   . Primary osteoarthritis of both knees   . Nephrolithiasis   . History of elevated glucose   . Thrombophlebitis     right forearm  . History of BPH   . History of colonic polyps     [name]  . Epididymitis, left     S/P Epidimectomy  . Splenomegaly 05/15/09    abd U/S NML  . Hypertension, benign     takes Micardis daily  . Neuropathy     acute  . Bruises easily   . H/O hiatal hernia   . GERD (gastroesophageal reflux disease)     hx of but doesn't take any meds now  . Urinary frequency   . Urinary urgency   . Nocturia   . Urinary leakage   . Abnormality of gait 03/19/2013  . DVT of lower limb, acute 11/19/2013    Peroneal & distal tibial left 11/01/13  . Polio 1941    "mild case"  . CHF (congestive heart failure)     "low grade" (06/22/2014)  . DVT (deep venous thrombosis) 10/2013    LLE  . Pneumonia, bacterial     RML resolved, spirometry, normal  . Pneumonia 1933  . History of blood transfusion 05/2014  . Anemia 05/2014    "  related to Xarelto"  . Iron deficiency anemia 05/2014    "got iron infusion"  . Arthritis     "all over"  . Gout   . Malignant melanoma of ear     right  . Thrombophlebitis of superficial veins of right lower extremity 06/23/2014    Incidental finding doppler 05/30/14  . Shortness of breath   . Esophageal varices without mention of bleeding 08/08/2014  . Portal vein thrombosis   . Duodenal cancer     Medications:  Anti-infectives    None     Assessment: 57 yoM admitted 5/23 with vomiting and dizziness.  Significant PMH includes colon cancer with ongoing chemo (last FOLFOX given 5/2, next due 5/17 but delayed until 5/24).  Pharmacy is consulted to dose Vancomycin and Cefepime for suspected sepsis.  Penicillin allergy  (swelling, rash) is noted and discussed with EDP, Dr. Doy Mince, but pt has tolerated amoxicillin and cefotetan in the past.   5/23 >> Vanc >> 5/23 >> Cefepime >>    Today, 05/15/2015:  Afebrile  WBC: 30.5  Renal: SCr 1.89, CrCl ~ 31 ml/min CG  Blood cultures ordered   Goal of Therapy:  Vancomycin trough level 15-20 mcg/ml  Plan:   Cefepime 2g IV q12h  Vancomycin 1250 mg IV q24h.  Measure Vanc trough at steady state.  Follow up renal fxn, culture results, and clinical course.   Gretta Arab PharmD, BCPS Pager [phone] 05/15/2015 6:13 PM

## 2015-05-15 NOTE — ED Provider Notes (Signed)
CSN: [id number]     Arrival date & time 05/15/15  1602 History   First MD Initiated Contact with Patient 05/15/15 1605     Chief Complaint  Patient presents with  . Emesis     (Consider location/radiation/quality/duration/timing/severity/associated sxs/prior Treatment) Patient is a 81 y.o. male presenting with vomiting.  Emesis Severity:  Mild Timing: once. Quality:  Stomach contents Progression:  Resolved Chronicity:  New Context comment:  Drinking water while lying down Relieved by:  Nothing Worsened by:  Nothing tried Associated symptoms: no cough and no fever   Associated symptoms comment:  Dizziness upon standing. No syncope.   Past Medical History  Diagnosis Date  . Bradycardia     Hypertensive  . carotid bruit, right   . hypercholesterolemia     Trig 234/293;takes Simcor daily  . Obesity, morbid   . Vasculitis   . 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 Beryle Beams; Dr. Beryle Beams , normal platelets  . Dermatitis     legs  . Bleeding gums   . Chronic low back pain   . Myalgia and myositis   . Unspecified vitamin D deficiency   . Bladder diverticulum   . Primary osteoarthritis of both knees   . Nephrolithiasis   . History of elevated glucose   . Thrombophlebitis     right forearm  . History of BPH   . History of colonic polyps     [name]  . Epididymitis, left     S/P Epidimectomy  . Splenomegaly 05/15/09    abd U/S NML  . Hypertension, benign     takes Micardis daily  . Neuropathy     acute  . Bruises easily   . H/O hiatal hernia   . GERD (gastroesophageal reflux disease)     hx of but doesn't take any meds now  . Urinary frequency   . Urinary urgency   . Nocturia   . Urinary leakage   . Abnormality of gait 03/19/2013  . DVT of lower limb, acute 11/19/2013    Peroneal & distal tibial left 11/01/13  . Polio 1941    "mild case"  . CHF (congestive heart failure)     "low grade" (06/22/2014)  . DVT (deep  venous thrombosis) 10/2013    LLE  . Pneumonia, bacterial     RML resolved, spirometry, normal  . Pneumonia 1933  . History of blood transfusion 05/2014  . Anemia 05/2014    "related to Xarelto"  . Iron deficiency anemia 05/2014    "got iron infusion"  . Arthritis     "all over"  . Gout   . Malignant melanoma of ear     right  . Thrombophlebitis of superficial veins of right lower extremity 06/23/2014    Incidental finding doppler 05/30/14  . Shortness of breath   . Esophageal varices without mention of bleeding 08/08/2014  . Portal vein thrombosis   . Duodenal cancer    Past Surgical History  Procedure Laterality Date  . Cystoscopy w/ stone manipulation  1950's  . Fem cutaneous nerve entrapment  1977    due to surgery (mild lat anesth of bilat legs)  . Limited pelvis/hip bone scan  04/99    negative  . Bone scan  04/00  . Cataract extraction w/ intraocular lens  implant, bilateral Bilateral ~ 2004  . Melanoma excision Right 06/04    ear; Wide local excision,,with flap  . Stress cardiolite  05/11/04    mild  inf. ischemia, EF 71%  . Shoulder surgery Left 06/17/2005    "tore it all to pieces when I fell; not fractured"  . Bronchoscopy  09/08    RML collapse with chronic pneumonia, bx neg (Dr. Joya Gaskins)  . Cyst excision Right 03/07/09    middle finger, DIP Joint Mucoid (Dr. Fredna Dow)  . Eyelid & eyebrow lift  1996  . Cyst excision Right 08/15/09    middle finger, DIP Joint Mucoid Cyst (Dr. Fredna Dow)  . Appendectomy  1950  . Excision of mucoid tumor    . Colonoscopy    . Total knee arthroplasty  01/10/2012    Procedure: TOTAL KNEE ARTHROPLASTY;  Surgeon: Augustin Schooling, MD;  Location: [place];  Service: Orthopedics;  Laterality: Left;  . Surgery scrotal / testicular Left ~ 2000    removal of mass, noncancerous  . Cardiac catheterization  05/23/04    mild plague-statin  . Coronary angioplasty with stent placement  06/22/2014    to proximal LAD  . Inguinal hernia repair Bilateral 1959   . Inguinal hernia repair Left 1970's  . Esophagogastroduodenoscopy N/A 08/08/2014    Procedure: ESOPHAGOGASTRODUODENOSCOPY (EGD);  Surgeon: Gatha Mayer, MD;  Location: [facility] ENDOSCOPY;  Service: Endoscopy;  Laterality: N/A;  . Colonoscopy N/A 08/08/2014    Procedure: COLONOSCOPY;  Surgeon: Gatha Mayer, MD;  Location: [place];  Service: Endoscopy;  Laterality: N/A;  . Flexible sigmoidoscopy N/A 09/21/2014    Procedure: FLEXIBLE SIGMOIDOSCOPY;  Surgeon: Jerene Bears, MD;  Location: [place];  Service: Gastroenterology;  Laterality: N/A;  . Givens capsule study N/A 09/21/2014    Procedure: GIVENS CAPSULE STUDY;  Surgeon: Jerene Bears, MD;  Location: [facility] ENDOSCOPY;  Service: Endoscopy;  Laterality: N/A;  . Left and right heart catheterization with coronary angiogram N/A 06/22/2014    Procedure: LEFT AND RIGHT HEART CATHETERIZATION WITH CORONARY ANGIOGRAM;  Surgeon: Larey Dresser, MD;  Location: [facility] CATH LAB;  Service: Cardiovascular;  Laterality: N/A;  . Percutaneous coronary stent intervention (pci-s)  06/22/2014    Procedure: PERCUTANEOUS CORONARY STENT INTERVENTION (PCI-S);  Surgeon: Larey Dresser, MD;  Location: [facility] CATH LAB;  Service: Cardiovascular;;  . Colonoscopy N/A 01/25/2015    Procedure: COLONOSCOPY;  Surgeon: Gatha Mayer, MD;  Location: [facility] ENDOSCOPY;  Service: Endoscopy;  Laterality: N/A;  . Cystoscopy with biopsy Right 02/03/2015    Procedure: CYSTOSCOPY WITH COLD CUP BLADDER BIOPSY CAUTHERIZAITON OF RIGHT BLADDER CANCER AND SATILITE, TURBT RIGHT BLADDER BASE;  Surgeon: Ailene Rud, MD;  Location: [facility] ORS;  Service: Urology;  Laterality: Right;  . Esophagogastroduodenoscopy N/A 03/03/2015    Procedure: ESOPHAGOGASTRODUODENOSCOPY (EGD);  Surgeon: Jerene Bears, MD;  Location: [facility] ENDOSCOPY;  Service: Gastroenterology;  Laterality: N/A;  . Gastrojejunostomy N/A 03/09/2015    Procedure: LAPAROSCOPIC GASTROJEJUNOSTOMY;  Surgeon: Excell Seltzer, MD;  Location: [facility] ORS;  Service:  General;  Laterality: N/A;  . Portacath placement Right 04/05/2015    Procedure: INSERTION PORT-A-CATH;  Surgeon: Excell Seltzer, MD;  Location: [place];  Service: General;  Laterality: Right;  . Esophagogastroduodenoscopy N/A 04/14/2015    Procedure: ESOPHAGOGASTRODUODENOSCOPY (EGD);  Surgeon: Jerene Bears, MD;  Location: [facility] ENDOSCOPY;  Service: Endoscopy;  Laterality: N/A;  bedside case   Family History  Problem Relation Age of Onset  . Thyroid cancer Mother   . Diabetes Father   . Heart disease Father   . Stroke Brother     cerebral hemm  . Colon cancer Brother   . Anesthesia problems  Neg Hx   . Hypotension Neg Hx   . Malignant hyperthermia Neg Hx   . Pseudochol deficiency Neg Hx   . Prostate cancer Neg Hx   . Heart attack Father    History  Substance Use Topics  . Smoking status: Former Smoker -- 2.00 packs/day for 20 years    Types: Cigarettes, Cigars    Quit date: 09/27/1967  . Smokeless tobacco: Never Used  . Alcohol Use: No    Review of Systems  Gastrointestinal: Positive for vomiting.  All other systems reviewed and are negative.     Allergies  Sulfa antibiotics; Aleve; Rofecoxib; Sulfamethoxazole-trimethoprim; Tape; Tramadol hcl; Neomycin-bacitracin zn-polymyx; Neosporin; and Penicillins  Home Medications   Prior to Admission medications   Medication Sig Start Date End Date Taking? Authorizing Provider  benzonatate (TESSALON) 200 MG capsule Take 1 capsule (200 mg total) by mouth 3 (three) times daily as needed. Patient taking differently: Take 200 mg by mouth 3 (three) times daily as needed for cough.  04/24/15  Yes Tonia Ghent, MD  Cholecalciferol (VITAMIN D3) 2000 UNITS capsule Take 2,000 Units by mouth every morning.    Yes Historical Provider, MD  dibucaine (NUPERCAINAL) 1 % ointment Apply topically 3 (three) times daily as needed for pain. 04/24/15  Yes Tonia Ghent, MD  feeding supplement, ENSURE ENLIVE, (ENSURE ENLIVE) LIQD Take  237 mLs by mouth 3 (three) times daily between meals. 03/16/15  Yes Debbe Odea, MD  finasteride (PROSCAR) 5 MG tablet Take 5 mg by mouth daily.   Yes Historical Provider, MD  furosemide (LASIX) 40 MG tablet Take 0.5 tablets (20 mg total) by mouth daily. 04/17/15  Yes Oswald Hillock, MD  hydrocortisone (ANUSOL-HC) 2.5 % rectal cream Apply topically 2 (two) times daily. Patient taking differently: Apply topically 2 (two) times daily as needed for hemorrhoids or itching.  04/17/15  Yes Oswald Hillock, MD  levothyroxine (LEVOTHROID) 25 MCG tablet Take 1 tablet (25 mcg total) by mouth daily before breakfast. 05/04/15  Yes Tonia Ghent, MD  lidocaine-prilocaine (EMLA) cream Apply 1 application topically as needed. 04/06/15  Yes Owens Shark, NP  LIVALO 4 MG TABS TAKE 1 TABLET AT BEDTIME 10/03/14  Yes Jolaine Artist, MD  loperamide (IMODIUM A-D) 2 MG tablet Take 1 tablet (2 mg total) by mouth 2 (two) times daily as needed for diarrhea or loose stools. 04/24/15  Yes Tonia Ghent, MD  Multiple Vitamin (MULTIVITAMIN WITH MINERALS) TABS tablet Take 1 tablet by mouth daily.   Yes Historical Provider, MD  OVER THE COUNTER MEDICATION Place 2 drops into both eyes 4 (four) times daily. Wash type eye drop-   Yes Historical Provider, MD  oxyCODONE-acetaminophen (ROXICET) 5-325 MG per tablet Take 1 tablet by mouth every 4 (four) hours as needed for severe pain. 03/21/15  Yes Ladell Pier, MD  pantoprazole (PROTONIX) 40 MG tablet TAKE 1 TABLET DAILY Patient taking differently: TAKE 1 TABLET TWICE DAILY 05/08/15  Yes Larey Dresser, MD  POLY-IRON 150 FORTE 222-97-9 MG-MCG-MG CAPS TAKE 1 CAPSULE TWICE A DAY 05/08/15  Yes Larey Dresser, MD  pramoxine (PROCTOFOAM) 1 % foam Place 1 application rectally 3 (three) times daily as needed for itching. 04/24/15  Yes Tonia Ghent, MD  predniSONE (DELTASONE) 5 MG tablet Take 1 tablet (5 mg total) by mouth daily with breakfast. 03/31/15  Yes Owens Shark, NP  Probiotic Product  (PROBIOTIC & ACIDOPHILUS EX ST PO) Take 1 tablet by mouth every  morning.    Yes Historical Provider, MD  prochlorperazine (COMPAZINE) 5 MG tablet Take 1 tablet (5 mg total) by mouth every 6 (six) hours as needed for nausea or vomiting. 03/21/15  Yes Ladell Pier, MD  Psyllium (METAMUCIL PO) Take 20 mLs by mouth daily as needed (constipation).    Yes Historical Provider, MD  spironolactone (ALDACTONE) 25 MG tablet Take 0.5 tablets (12.5 mg total) by mouth daily. 04/17/15  Yes Oswald Hillock, MD  KLOR-CON M20 20 MEQ tablet TAKE 1 TABLET DAILY Patient not taking: Reported on 05/15/2015 05/08/15   Larey Dresser, MD  nitroGLYCERIN (NITROSTAT) 0.4 MG SL tablet Place 1 tablet (0.4 mg total) under the tongue every 5 (five) minutes as needed. For chest pain Patient not taking: Reported on 05/15/2015 07/07/14   Larey Dresser, MD   There were no vitals taken for this visit. Physical Exam  Constitutional: He is oriented to person, place, and time. He appears well-developed and well-nourished. No distress.  HENT:  Head: Normocephalic and atraumatic.  Mouth/Throat: Oropharynx is clear and moist. Mucous membranes are dry.  Eyes: Conjunctivae are normal. Pupils are equal, round, and reactive to light. No scleral icterus.  Neck: Neck supple.  Cardiovascular: Normal rate, regular rhythm and intact distal pulses.  Exam reveals distant heart sounds.   No murmur heard. Pulmonary/Chest: Effort normal. No stridor. No respiratory distress. He has no wheezes. He has rales (bibasilar).  Abdominal: Soft. He exhibits no distension. There is no tenderness.  Musculoskeletal: Normal range of motion. He exhibits no edema.  Neurological: He is alert and oriented to person, place, and time.  Skin: Skin is warm and dry. No rash noted.  Psychiatric: He has a normal mood and affect. His behavior is normal.  Nursing note and vitals reviewed.   ED Course  CRITICAL CARE Performed by: Serita Grit Authorized by: Serita Grit Total critical care time: 35 minutes Critical care time was exclusive of separately billable procedures and treating other patients. Critical care was necessary to treat or prevent imminent or life-threatening deterioration of the following conditions: sepsis. Critical care was time spent personally by me on the following activities: development of treatment plan with patient or surrogate, discussions with consultants, evaluation of patient's response to treatment, examination of patient, obtaining history from patient or surrogate, ordering and performing treatments and interventions, ordering and review of laboratory studies, ordering and review of radiographic studies, pulse oximetry, re-evaluation of patient's condition and review of old charts.   (including critical care time) Labs Review Labs Reviewed  CBC WITH DIFFERENTIAL/PLATELET - Abnormal; Notable for the following:    WBC 30.5 (*)    RBC 4.08 (*)    Hemoglobin 11.4 (*)    HCT 36.0 (*)    RDW 18.9 (*)    Platelets 438 (*)    Neutrophils Relative % 92 (*)    Lymphocytes Relative 2 (*)    Neutro Abs 28.1 (*)    Lymphs Abs 0.6 (*)    Monocytes Absolute 1.8 (*)    All other components within normal limits  COMPREHENSIVE METABOLIC PANEL - Abnormal; Notable for the following:    Sodium 132 (*)    Chloride 100 (*)    CO2 15 (*)    Glucose, Bld 148 (*)    BUN 21 (*)    Creatinine, Ser 1.89 (*)    Albumin 2.6 (*)    AST 44 (*)    Alkaline Phosphatase 284 (*)    GFR calc  non Af Amer 31 (*)    GFR calc Af Amer 36 (*)    Anion gap 17 (*)    All other components within normal limits  URINALYSIS, ROUTINE W REFLEX MICROSCOPIC - Abnormal; Notable for the following:    Hgb urine dipstick MODERATE (*)    All other components within normal limits  TROPONIN I - Abnormal; Notable for the following:    Troponin I 0.06 (*)    All other components within normal limits  BRAIN NATRIURETIC PEPTIDE - Abnormal; Notable for the following:     B Natriuretic Peptide 157.2 (*)    All other components within normal limits  BLOOD GAS, ARTERIAL - Abnormal; Notable for the following:    pH, Arterial 7.494 (*)    pCO2 arterial 19.3 (*)    pO2, Arterial 78.9 (*)    Bicarbonate 14.7 (*)    Acid-base deficit 6.8 (*)    All other components within normal limits  I-STAT CG4 LACTIC ACID, ED - Abnormal; Notable for the following:    Lactic Acid, Venous 5.16 (*)    All other components within normal limits  I-STAT CG4 LACTIC ACID, ED - Abnormal; Notable for the following:    Lactic Acid, Venous 3.49 (*)    All other components within normal limits  CULTURE, BLOOD (ROUTINE X 2)  CULTURE, BLOOD (ROUTINE X 2)  CULTURE, EXPECTORATED SPUTUM-ASSESSMENT  GRAM STAIN  MRSA PCR SCREENING  URINE MICROSCOPIC-ADD ON  LEGIONELLA ANTIGEN, URINE  STREP PNEUMONIAE URINARY ANTIGEN  CBC WITH DIFFERENTIAL/PLATELET  COMPREHENSIVE METABOLIC PANEL  LACTIC ACID, PLASMA  PROTIME-INR  CBC WITH DIFFERENTIAL/PLATELET  APTT  I-STAT CG4 LACTIC ACID, ED  I-STAT CG4 LACTIC ACID, ED    Imaging Review Dg Chest 2 View  05/15/2015   CLINICAL DATA:  Pt from home, complains of emesis and dizziness, denies weakness. Hx colon cancer.  EXAM: CHEST  2 VIEW  COMPARISON:  Cough 04/12/2015  FINDINGS: Cardiac silhouette mildly enlarged. No mediastinal or hilar masses or evidence of adenopathy.  Minor reticular scarring in the antral lateral lung bases. This is unchanged. No lung consolidation or edema. No pleural effusion or pneumothorax.  Right anterior chest wall Port-A-Cath is stable.  Bony thorax is intact.  IMPRESSION: No acute cardiopulmonary disease.   Electronically Signed   By: Lajean Manes M.D.   On: 05/15/2015 17:08     EKG Interpretation None      MDM   Final diagnoses:  Shortness of breath    Pt initially presenting with dizziness and one episode of vomiting.  He had no fevers, but was found to be hypoxic.  I initially suspected aspiration from his  vomiting as the cause of his hypoxia.  However, ED workup revealed leukocytosis and ultimately he became febrile.  It became more clear that he was septic and he was treated as such with empiric antibiotics and fluids.  Source has remained unclear, but hemodynamics and lactate improved with IV fluids.  Admitted to stepdown unit.      Serita Grit, MD 05/16/15 [phone]

## 2015-05-16 ENCOUNTER — Other Ambulatory Visit: Payer: Medicare Other

## 2015-05-16 ENCOUNTER — Ambulatory Visit: Payer: Medicare Other

## 2015-05-16 ENCOUNTER — Inpatient Hospital Stay (HOSPITAL_COMMUNITY): Payer: Medicare Other

## 2015-05-16 ENCOUNTER — Ambulatory Visit: Payer: Medicare Other | Admitting: Nurse Practitioner

## 2015-05-16 ENCOUNTER — Other Ambulatory Visit: Payer: Self-pay | Admitting: Nurse Practitioner

## 2015-05-16 DIAGNOSIS — A419 Sepsis, unspecified organism: Principal | ICD-10-CM

## 2015-05-16 DIAGNOSIS — C787 Secondary malignant neoplasm of liver and intrahepatic bile duct: Secondary | ICD-10-CM

## 2015-05-16 DIAGNOSIS — D6481 Anemia due to antineoplastic chemotherapy: Secondary | ICD-10-CM

## 2015-05-16 DIAGNOSIS — D693 Immune thrombocytopenic purpura: Secondary | ICD-10-CM

## 2015-05-16 DIAGNOSIS — J188 Other pneumonia, unspecified organism: Secondary | ICD-10-CM

## 2015-05-16 DIAGNOSIS — C170 Malignant neoplasm of duodenum: Secondary | ICD-10-CM

## 2015-05-16 DIAGNOSIS — N179 Acute kidney failure, unspecified: Secondary | ICD-10-CM

## 2015-05-16 DIAGNOSIS — R599 Enlarged lymph nodes, unspecified: Secondary | ICD-10-CM

## 2015-05-16 DIAGNOSIS — D72829 Elevated white blood cell count, unspecified: Secondary | ICD-10-CM

## 2015-05-16 DIAGNOSIS — R1110 Vomiting, unspecified: Secondary | ICD-10-CM

## 2015-05-16 DIAGNOSIS — I81 Portal vein thrombosis: Secondary | ICD-10-CM

## 2015-05-16 DIAGNOSIS — N183 Chronic kidney disease, stage 3 (moderate): Secondary | ICD-10-CM

## 2015-05-16 DIAGNOSIS — R410 Disorientation, unspecified: Secondary | ICD-10-CM

## 2015-05-16 DIAGNOSIS — J690 Pneumonitis due to inhalation of food and vomit: Secondary | ICD-10-CM

## 2015-05-16 LAB — CBC WITH DIFFERENTIAL/PLATELET
BASOS ABS: 0 10*3/uL (ref 0.0–0.1)
Basophils Relative: 0 % (ref 0–1)
Eosinophils Absolute: 0 10*3/uL (ref 0.0–0.7)
Eosinophils Relative: 0 % (ref 0–5)
HEMATOCRIT: 27.3 % — AB (ref 39.0–52.0)
HEMOGLOBIN: 8.6 g/dL — AB (ref 13.0–17.0)
Lymphocytes Relative: 6 % — ABNORMAL LOW (ref 12–46)
Lymphs Abs: 1.5 10*3/uL (ref 0.7–4.0)
MCH: 27.7 pg (ref 26.0–34.0)
MCHC: 31.5 g/dL (ref 30.0–36.0)
MCV: 88.1 fL (ref 78.0–100.0)
MONO ABS: 1.5 10*3/uL — AB (ref 0.1–1.0)
Monocytes Relative: 6 % (ref 3–12)
Neutro Abs: 21.7 10*3/uL — ABNORMAL HIGH (ref 1.7–7.7)
Neutrophils Relative %: 88 % — ABNORMAL HIGH (ref 43–77)
Platelets: 266 10*3/uL (ref 150–400)
RBC: 3.1 MIL/uL — ABNORMAL LOW (ref 4.22–5.81)
RDW: 19 % — ABNORMAL HIGH (ref 11.5–15.5)
WBC: 24.7 10*3/uL — ABNORMAL HIGH (ref 4.0–10.5)

## 2015-05-16 LAB — PROTIME-INR
INR: 1.93 — AB (ref 0.00–1.49)
Prothrombin Time: 22 seconds — ABNORMAL HIGH (ref 11.6–15.2)

## 2015-05-16 LAB — COMPREHENSIVE METABOLIC PANEL
ALK PHOS: 195 U/L — AB (ref 38–126)
ALT: 44 U/L (ref 17–63)
AST: 35 U/L (ref 15–41)
Albumin: 2 g/dL — ABNORMAL LOW (ref 3.5–5.0)
Anion gap: 12 (ref 5–15)
BILIRUBIN TOTAL: 0.7 mg/dL (ref 0.3–1.2)
BUN: 23 mg/dL — ABNORMAL HIGH (ref 6–20)
CO2: 18 mmol/L — ABNORMAL LOW (ref 22–32)
Calcium: 7.9 mg/dL — ABNORMAL LOW (ref 8.9–10.3)
Chloride: 104 mmol/L (ref 101–111)
Creatinine, Ser: 1.59 mg/dL — ABNORMAL HIGH (ref 0.61–1.24)
GFR calc Af Amer: 44 mL/min — ABNORMAL LOW (ref >60)
GFR calc non Af Amer: 38 mL/min — ABNORMAL LOW (ref >60)
Glucose, Bld: 119 mg/dL — ABNORMAL HIGH (ref 65–99)
Potassium: 4.6 mmol/L (ref 3.5–5.1)
SODIUM: 134 mmol/L — AB (ref 135–145)
TOTAL PROTEIN: 5.2 g/dL — AB (ref 6.5–8.1)

## 2015-05-16 LAB — EXPECTORATED SPUTUM ASSESSMENT W GRAM STAIN, RFLX TO RESP C

## 2015-05-16 LAB — MRSA PCR SCREENING: MRSA by PCR: NEGATIVE

## 2015-05-16 LAB — APTT: APTT: 46 s — AB (ref 24–37)

## 2015-05-16 LAB — EXPECTORATED SPUTUM ASSESSMENT W REFEX TO RESP CULTURE

## 2015-05-16 LAB — STREP PNEUMONIAE URINARY ANTIGEN: Strep Pneumo Urinary Antigen: POSITIVE — AB

## 2015-05-16 LAB — LACTIC ACID, PLASMA: LACTIC ACID, VENOUS: 1 mmol/L (ref 0.5–2.0)

## 2015-05-16 MED ORDER — ACETAMINOPHEN 325 MG PO TABS
650.0000 mg | ORAL_TABLET | Freq: Four times a day (QID) | ORAL | Status: DC | PRN
  Administered 2015-05-16: 650 mg via ORAL
  Filled 2015-05-16: qty 2

## 2015-05-16 MED ORDER — SODIUM CHLORIDE 0.9 % IV SOLN
INTRAVENOUS | Status: AC
  Administered 2015-05-16 – 2015-05-17 (×3): via INTRAVENOUS

## 2015-05-16 MED ORDER — HYDROCORTISONE 2.5 % RE CREA
TOPICAL_CREAM | Freq: Three times a day (TID) | RECTAL | Status: DC
  Administered 2015-05-16: 10:00:00 via RECTAL
  Administered 2015-05-16 (×2): 1 via RECTAL
  Administered 2015-05-17 – 2015-05-18 (×6): via RECTAL
  Administered 2015-05-19: 1 via RECTAL
  Administered 2015-05-19 (×2): via RECTAL
  Administered 2015-05-20: 1 via RECTAL
  Filled 2015-05-16 (×2): qty 28.35

## 2015-05-16 MED ORDER — SODIUM CHLORIDE 0.9 % IV BOLUS (SEPSIS)
500.0000 mL | Freq: Once | INTRAVENOUS | Status: AC
  Administered 2015-05-16: 500 mL via INTRAVENOUS

## 2015-05-16 MED ORDER — CETYLPYRIDINIUM CHLORIDE 0.05 % MT LIQD
7.0000 mL | Freq: Two times a day (BID) | OROMUCOSAL | Status: DC
  Administered 2015-05-16 – 2015-05-17 (×4): 7 mL via OROMUCOSAL

## 2015-05-16 MED ORDER — SODIUM CHLORIDE 0.9 % IV BOLUS (SEPSIS)
1000.0000 mL | Freq: Once | INTRAVENOUS | Status: AC
  Administered 2015-05-16: 1000 mL via INTRAVENOUS

## 2015-05-16 MED ORDER — CHLORHEXIDINE GLUCONATE 0.12 % MT SOLN
15.0000 mL | Freq: Two times a day (BID) | OROMUCOSAL | Status: DC
  Administered 2015-05-16 – 2015-05-17 (×4): 15 mL via OROMUCOSAL
  Filled 2015-05-16 (×3): qty 15

## 2015-05-16 NOTE — Evaluation (Signed)
Clinical/Bedside Swallow Evaluation Patient Details  Name: James [name] MRN: [id number] Date of Birth: [birth year]  Today's Date: 05/16/2015 Time: SLP Start Time (ACUTE ONLY): 0845 SLP Stop Time (ACUTE ONLY): 0920 SLP Time Calculation (min) (ACUTE ONLY): 35 min  Past Medical History:  Past Medical History  Diagnosis Date  . Bradycardia     Hypertensive  . carotid bruit, right   . hypercholesterolemia     Trig 234/293;takes Simcor daily  . Obesity, morbid   . Vasculitis   . 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 Beryle Beams; Dr. Beryle Beams , normal platelets  . Dermatitis     legs  . Bleeding gums   . Chronic low back pain   . Myalgia and myositis   . Unspecified vitamin D deficiency   . Bladder diverticulum   . Primary osteoarthritis of both knees   . Nephrolithiasis   . History of elevated glucose   . Thrombophlebitis     right forearm  . History of BPH   . History of colonic polyps     [name]  . Epididymitis, left     S/P Epidimectomy  . Splenomegaly 05/15/09    abd U/S NML  . Hypertension, benign     takes Micardis daily  . Neuropathy     acute  . Bruises easily   . H/O hiatal hernia   . GERD (gastroesophageal reflux disease)     hx of but doesn't take any meds now  . Urinary frequency   . Urinary urgency   . Nocturia   . Urinary leakage   . Abnormality of gait 03/19/2013  . DVT of lower limb, acute 11/19/2013    Peroneal & distal tibial left 11/01/13  . Polio 1941    "mild case"  . CHF (congestive heart failure)     "low grade" (06/22/2014)  . DVT (deep venous thrombosis) 10/2013    LLE  . Pneumonia, bacterial     RML resolved, spirometry, normal  . Pneumonia 1933  . History of blood transfusion 05/2014  . Anemia 05/2014    "related to Xarelto"  . Iron deficiency anemia 05/2014    "got iron infusion"  . Arthritis     "all over"  . Gout   . Malignant melanoma of ear     right  . Thrombophlebitis of  superficial veins of right lower extremity 06/23/2014    Incidental finding doppler 05/30/14  . Shortness of breath   . Esophageal varices without mention of bleeding 08/08/2014  . Portal vein thrombosis   . Duodenal cancer    Past Surgical History:  Past Surgical History  Procedure Laterality Date  . Cystoscopy w/ stone manipulation  1950's  . Fem cutaneous nerve entrapment  1977    due to surgery (mild lat anesth of bilat legs)  . Limited pelvis/hip bone scan  04/99    negative  . Bone scan  04/00  . Cataract extraction w/ intraocular lens  implant, bilateral Bilateral ~ 2004  . Melanoma excision Right 06/04    ear; Wide local excision,,with flap  . Stress cardiolite  05/11/04    mild inf. ischemia, EF 71%  . Shoulder surgery Left 06/17/2005    "tore it all to pieces when I fell; not fractured"  . Bronchoscopy  09/08    RML collapse with chronic pneumonia, bx neg (Dr. Joya Gaskins)  . Cyst excision Right 03/07/09    middle finger, DIP Joint Mucoid (Dr. Fredna Dow)  .  Eyelid & eyebrow lift  1996  . Cyst excision Right 08/15/09    middle finger, DIP Joint Mucoid Cyst (Dr. Fredna Dow)  . Appendectomy  1950  . Excision of mucoid tumor    . Colonoscopy    . Total knee arthroplasty  01/10/2012    Procedure: TOTAL KNEE ARTHROPLASTY;  Surgeon: Augustin Schooling, MD;  Location: [place];  Service: Orthopedics;  Laterality: Left;  . Surgery scrotal / testicular Left ~ 2000    removal of mass, noncancerous  . Cardiac catheterization  05/23/04    mild plague-statin  . Coronary angioplasty with stent placement  06/22/2014    to proximal LAD  . Inguinal hernia repair Bilateral 1959  . Inguinal hernia repair Left 1970's  . Esophagogastroduodenoscopy N/A 08/08/2014    Procedure: ESOPHAGOGASTRODUODENOSCOPY (EGD);  Surgeon: Gatha Mayer, MD;  Location[facility] ENDOSCOPY;  Service: Endoscopy;  Laterality: N/A;  . Colonoscopy N/A 08/08/2014    Procedure: COLONOSCOPY;  Surgeon: Gatha Mayer, MD;  Location: [place];   Service: Endoscopy;  Laterality: N/A;  . Flexible sigmoidoscopy N/A 09/21/2014    Procedure: FLEXIBLE SIGMOIDOSCOPY;  Surgeon: Jerene Bears, MD;  Location: [place];  Service: Gastroenterology;  Laterality: N/A;  . Givens capsule study N/A 09/21/2014    Procedure: GIVENS CAPSULE STUDY;  Surgeon: Jerene Bears, MD;  Location: [facility] ENDOSCOPY;  Service: Endoscopy;  Laterality: N/A;  . Left and right heart catheterization with coronary angiogram N/A 06/22/2014    Procedure: LEFT AND RIGHT HEART CATHETERIZATION WITH CORONARY ANGIOGRAM;  Surgeon: Larey Dresser, MD;  Location: [facility] CATH LAB;  Service: Cardiovascular;  Laterality: N/A;  . Percutaneous coronary stent intervention (pci-s)  06/22/2014    Procedure: PERCUTANEOUS CORONARY STENT INTERVENTION (PCI-S);  Surgeon: Larey Dresser, MD;  Location: [facility] CATH LAB;  Service: Cardiovascular;;  . Colonoscopy N/A 01/25/2015    Procedure: COLONOSCOPY;  Surgeon: Gatha Mayer, MD;  Location: [facility] ENDOSCOPY;  Service: Endoscopy;  Laterality: N/A;  . Cystoscopy with biopsy Right 02/03/2015    Procedure: CYSTOSCOPY WITH COLD CUP BLADDER BIOPSY CAUTHERIZAITON OF RIGHT BLADDER CANCER AND SATILITE, TURBT RIGHT BLADDER BASE;  Surgeon: Ailene Rud, MD;  Location: [facility] ORS;  Service: Urology;  Laterality: Right;  . Esophagogastroduodenoscopy N/A 03/03/2015    Procedure: ESOPHAGOGASTRODUODENOSCOPY (EGD);  Surgeon: Jerene Bears, MD;  Location: [facility] ENDOSCOPY;  Service: Gastroenterology;  Laterality: N/A;  . Gastrojejunostomy N/A 03/09/2015    Procedure: LAPAROSCOPIC GASTROJEJUNOSTOMY;  Surgeon: Excell Seltzer, MD;  Location: [facility] ORS;  Service: General;  Laterality: N/A;  . Portacath placement Right 04/05/2015    Procedure: INSERTION PORT-A-CATH;  Surgeon: Excell Seltzer, MD;  Location: [place];  Service: General;  Laterality: Right;  . Esophagogastroduodenoscopy N/A 04/14/2015    Procedure: ESOPHAGOGASTRODUODENOSCOPY (EGD);  Surgeon: Jerene Bears, MD;   Location: [facility] ENDOSCOPY;  Service: Endoscopy;  Laterality: N/A;  bedside case   HPI:  81 yo male h/o colon cancer with liver mets adm to [facility] 5/23 after nausea/vomiting episode with aspiration.  Pt undergoing chemo although he reports he was unable to have most recent chemo tx due to his WBC being too low.  Now he reports it is too high indicating infection.  Pt is also s/p palliative gastrojejunostomy.  PMH + GERD, hiatal hernia, polio (mild) 1941, pna, anemia, esophageal varices.  CXR 5/23 negative.  Swallow evaluation ordered.    Assessment / Plan / Recommendation Clinical Impression  Pt presents with functional oropharyngeal swallow ability based on clinical swallow  eval.  Negative CN exam noted.  Pt observed with gingerale, cracker and applesauce.  Swallow was timely with clear voice throughout indicating adequate oropharyngeal clearance.    Pt denied globus sensation or difficulties although frequent belching noted - which pt states is baseline prior to chemo starting. Pt reports main problem is "keeping food/drink down" and he coorelates this to his chemo tx.    Pt had consumed 4 crackers, 2 ounces gingerale and 1 ounce applesauce before he was finished.  He reported "more difficulty getting it down" but denied sensation of food/drink lodging nor nausea and did not elaborate furhter despite SLP questioning.    SLP educated pt to strategies to mitigate his symptoms.  Suspect primary aspiration risk is from pt's known issues with reflux, hiatal hernia and nausea from chemo tx.  SLP to sign off.      Aspiration Risk  Mild (due to GI issues)    Diet Recommendation Age appropriate regular solids;Thin   Medication Administration: Whole meds with liquid Compensations: Slow rate;Small sips/bites (rest if sense fullness, nausea or decreased clearance)    Other  Recommendations Oral Care Recommendations: Oral care BID   Follow Up Recommendations    n/a   Frequency and Duration   n/a      Pertinent Vitals/Pain Febrile, decreased     Swallow Study Prior Functional Status   see [place] Date of Onset: 05/15/15 Other Pertinent Information: 81 yo male h/o colon cancer with liver mets adm to [facility] 5/23 after nausea/vomiting episode with aspiration.  Pt undergoing chemo although he reports he was unable to have most recent chemo tx due to his WBC being too low.  Now he reports it is too high indicating infection.  Pt is also s/p palliative gastrojejunostomy.  PMH + GERD, hiatal hernia, polio (mild) 1941, pna, anemia, esophageal varices.  CXR 5/23 negative.  Swallow evaluation ordered.  Type of Study: Bedside swallow evaluation Diet Prior to this Study: NPO Temperature Spikes Noted: Yes Respiratory Status: Room air History of Recent Intubation: No Behavior/Cognition: Alert;Cooperative;Pleasant mood Oral Cavity - Dentition: Adequate natural dentition/normal for age Self-Feeding Abilities: Able to feed self Patient Positioning: Upright in bed Baseline Vocal Quality: Normal Volitional Cough: Strong Volitional Swallow: Able to elicit    Oral/Motor/Sensory Function Overall Oral Motor/Sensory Function: Appears within functional limits for tasks assessed (dark purple tinged round area noted on posterior pharynx (approx size of a dime) - upper right - viewed only with excessive phonation - pt denies pain in this area)   Ice Chips Ice chips: Not tested   Thin Liquid Thin Liquid: Within functional limits Presentation: Cup;Self Fed    Nectar Thick Nectar Thick Liquid: Not tested   Honey Thick Honey Thick Liquid: Not tested   Puree Puree: Within functional limits Presentation: Self Fed;Spoon   Solid   GO    Solid: Within functional limits Presentation: Self Fed;Spoon       Luanna Salk, [facility] SLP [phone]

## 2015-05-16 NOTE — Care Management Note (Signed)
Case Management Note  Patient Details  Name: James [name] MRN: [id number] Date of Birth: Oct 15, 1928  Subjective/Objective:                 Hypotensive    Action/Plan: home when stable   Expected Discharge Date:   (unknown)          [id number]     Expected Discharge Plan:  Home/Self Care  In-House Referral:  NA  Discharge planning Services  CM Consult  Post Acute Care Choice:  NA Choice offered to:  NA  DME Arranged:    DME Agency:     HH Arranged:    [place] Agency:     Status of Service:     Medicare Important Message Given:    Date Medicare IM Given:    Medicare IM give by:    Date Additional Medicare IM Given:    Additional Medicare Important Message give by:     If discussed at [place] of Stay Meetings, dates discussed:    Additional Comments:  Leeroy Cha, RN 05/16/2015, 12:09 PM

## 2015-05-16 NOTE — Progress Notes (Signed)
Date:  May 16, 2015 U.R. performed for needs and level of care. Will continue to follow for Case Management needs.  Velva Harman, RN, BSN, [place]   [phone]

## 2015-05-16 NOTE — Progress Notes (Signed)
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 He was hypoxic requiring O2 mask. Denies any chest pain or palpitations. Denies lower extremity swelling. Denies abdominal pain, constipation or diarrhea. Denies any dysuria. Denies abnormal skin rashes. Denies any bleeding issues such as epistaxis, hematemesis, hematuria or hematochezia. Chest x ray was negative for acute changes. CBC was remarkable for leukocytosis with a WBC 30.5 (no Neulasta received during last chemo). He was dehydrated and had elevated lactic acid elevation, suggesting SIRS, of possible aspiration pneumonia etiology. Cultures were drawn, and initiated on broad spectrum antibiotics, including Vanco and Cefepime, IV aggressive hydration. He was also placed on hydrocortisone (he is on chronic prednisone as outpatient.   This morning he reports feeling better, no further vomiting, no respiratory complaints. He is somewhat confused during visit, with delayed responses. No other issues are noted. His CBC shows a WBC improving to 24.7, Hb 8.6 and platelets 266k. We were kindly informed of the patient's admission.   Brief Oncological History: 1.Duodenal mass, abdominal/retroperitoneal lymphadenopathy, and liver metastases noted on a CT 03/01/2015  Upper endoscopy 03/03/2015 confirmed a duodenal mass, status post a biopsy with the final pathology confirming a  carcinoma, immunohistochemical stains negative for lymphoma, neuroendocrine carcinoma, and melanoma markers. Positive staining for CDX2, cytokeratin AE1/AE3, and CD10  PET scan 03/29/2015 showed a large intensely hypermetabolic mass circumferentially narrowing the second portion of the duodenum. Adjacent hypermetabolic lymph nodes medial to the duodenum mass. Irregular hypermetabolic nodal mass in the inferior aspect of the gastrohepatic ligament. Hypermetabolic nodal metastasis in the retroperitoneum left of the aorta. No abnormal metabolic activity within the enlarged inferior right lobe of the liver.  Cycle 1 FOLFOX 04/11/2015  Cycle 2 FOLFOX 04/25/2015 Cycle 3 held due to neutropenia on 5/17, decline in performance status.   2. Gastric outlet obstruction secondary to #1, status post a palliative gastrojejunostomy 03/09/2015  3. 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 Portal vein thrombosis diagnosed in September 2015  5. Left leg DVT December 2014  6. History of coronary artery disease, status post an LAD drug-eluting stent July 2015  7. Remote history of melanoma of the right ear  8. 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 History of drug related ITP  10. Abdominal pain secondary to the duodenal mass/liver metastases/adenopathy  11. Neuropathy  12. Altered mental status 04/12/2015-resolved  13. External hemorrhoid-he will continue hemorrhoid creams, to be referred to Dr. Excell [name]   Scheduled Meds: . antiseptic oral rinse  7 mL Mouth Rinse q12n4p  . ceFEPime (MAXIPIME) IV  2 g Intravenous Q12H  . chlorhexidine  15 mL Mouth Rinse BID  . heparin  5,000 Units Subcutaneous 3 times per day  .  hydrocortisone   Rectal TID  .  hydrocortisone sod succinate (SOLU-CORTEF) inj  50 mg Intravenous Q8H  . levothyroxine  25 mcg Oral QAC breakfast  . pantoprazole (PROTONIX) IV  40 mg Intravenous Q12H  . vancomycin  1,250 mg Intravenous Q24H   Continuous Infusions: . sodium chloride 75 mL/hr at 05/16/15 0034   PRN Meds:.lidocaine-prilocaine  Objective:  Filed Vitals:   05/16/15 0600  BP: 99/34  Pulse: 42  Temp:   Resp: 17      Intake/Output Summary (Last 24 hours) at 05/16/15 0743 Last data filed at 05/16/15 0600  Gross per 24 hour  Intake 1111.67 ml  Output      0 ml  Net 1111.67 ml      GENERAL:alert, no distress and comfortable SKIN: skin color, texture, turgor are normal, no rashes or significant lesions EYES: normal, conjunctiva are pink and non-injected, sclera clear OROPHARYNX:no exudate, no erythema and lips, buccal mucosa, and tongue normal  NECK: supple, thyroid normal size, non-tender, without nodularity LYMPH:  no palpable lymphadenopathy in the cervical, axillary or inguinal LUNGS: clear to auscultation and percussion with normal breathing effort HEART: regular rate & rhythm and no murmurs and no lower extremity edema. Right Port A Cath normal. ABDOMEN: soft, non-tender and normal bowel sounds. Right sided fullness Musculoskeletal:no cyanosis of digits and no clubbing  PSYCH: alert & oriented x 3 with fluent speech. Responses are delayed NEURO: no focal motor/sensory deficits    CBG (last 3)  No results for input(s): GLUCAP in the last 72 hours.   Labs:   Recent Labs Lab 05/09/15 1004 05/15/15 1643 05/16/15 0510  WBC 2.0* 30.5* 24.7*  HGB 11.0* 11.4* 8.6*  HCT 34.2* 36.0* 27.3*  PLT 271 438* 266  MCV 87.0 88.2 88.1  MCH 28.0 27.9 27.7  MCHC 32.2 31.7 31.5  RDW 17.9* 18.9* 19.0*  LYMPHSABS 0.6* 0.6* 1.5  MONOABS 0.7 1.8* 1.5*  EOSABS 0.0 0.0 0.0  BASOSABS 0.0 0.0 0.0     Chemistries:    Recent Labs Lab 05/09/15 1004 05/15/15 1643  05/16/15 0510  NA 134* 132* 134*  K 4.6 4.4 4.6  CL  --  100* 104  CO2 18* 15* 18*  GLUCOSE 128 148* 119*  BUN 23.4 21* 23*  CREATININE 1.4* 1.89* 1.59*  CALCIUM 8.7 9.0 7.9*  AST 32 44* 35  ALT 74* 57 44  ALKPHOS 273* 284* 195*  BILITOT 1.37* 1.1 0.7    GFR Estimated Creatinine Clearance: 38 mL/min (by C-G formula based on Cr of 1.59).  Liver Function Tests:  Recent Labs Lab 05/09/15 1004 05/15/15 1643 05/16/15 0510  AST 32 44* 35  ALT 74* 57 44  ALKPHOS 273* 284* 195*  BILITOT 1.37* 1.1 0.7  PROT 6.3* 6.9 5.2*  ALBUMIN 2.5* 2.6* 2.0*   No results for input(s): LIPASE, AMYLASE in the last 168 hours. No results for input(s): AMMONIA in the last 168 hours.  Urine Studies     Component Value Date/Time   COLORURINE YELLOW 05/15/2015 1742   APPEARANCEUR CLEAR 05/15/2015 1742   LABSPEC 1.007 05/15/2015 1742   PHURINE 6.5 05/15/2015 1742   GLUCOSEU NEGATIVE 05/15/2015 1742   HGBUR MODERATE* 05/15/2015 1742   BILIRUBINUR NEGATIVE 05/15/2015 1742   KETONESUR NEGATIVE 05/15/2015 1742   PROTEINUR NEGATIVE 05/15/2015 1742   UROBILINOGEN 0.2 05/15/2015 1742   NITRITE NEGATIVE 05/15/2015 1742   LEUKOCYTESUR NEGATIVE 05/15/2015 1742    Coagulation profile  Recent Labs Lab 05/16/15 0510  INR 1.93*    Cardiac Enzymes:  Recent Labs Lab  05/15/15 1643  TROPONINI 0.06*  Microbiology Cultures pending    Imaging Studies:  Dg Chest 2 View  05/15/2015   CLINICAL DATA:  Pt from home, complains of emesis and dizziness, denies weakness. Hx colon cancer.  EXAM: CHEST  2 VIEW  COMPARISON:  Cough 04/12/2015  FINDINGS: Cardiac silhouette mildly enlarged. No mediastinal or hilar masses or evidence of adenopathy.  Minor reticular scarring in the antral lateral lung bases. This is unchanged. No lung consolidation or edema. No pleural effusion or pneumothorax.  Right anterior chest wall Port-A-Cath is stable.  Bony thorax is intact.  IMPRESSION: No acute cardiopulmonary  disease.   Electronically Signed   By: Lajean Manes M.D.   On: 05/15/2015 17:08    Assessment/Plan: 81 y.o.  Aspiration Pneumonia with possible sepsis Shortness of breath with hypoxia He presented with shortness of breath and non productive cough, fever Chest X Ray was negative for acute changes He was treated empirically with Vanco and Cefepime in view of recent chemotherapy Symptoms improved. Cultures are pending   History of Portal Vein Thrombosis and Left Lower Extremity DVT History of ITP Emesis on 05/15/2015   Duodenal Carcinoma S/c Cycle 2 chemo FOLFOX on 04/25/15 Cycle 3 was held due to neutropenia and overall clinical decline He was due for C3 today, which will be placed on hold again due to decline in performance status At this point, discussions regarding further treatment will be addressed with his daughter. We may need Hospice involvement.  Leukocytosis Likely secondary to infection versus a leukemoid reaction related to cancer .  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 Cuffe was interviewed and examined. He was admitted yesterday after an episode of vomiting and confusion. He had a fever and leukocytosis. I suspect he may have had an early infection or all of his symptoms could be related to the metastatic carcinoma.  I discussed the situation at length with his daughter. The significance of the positive Streptococcus antigen in the urine is unclear.  His overall performance status has declined over the past several  weeks. I am concerned this will continue and we will need to consider Hospice care.  Recommendations: 1. I have a low clinical suspicion for a pulmonary embolism, I agree with canceling the ventilation/perfusion scan 2. Continue anti-biotics and follow-up cultures 3. Increase diet and ambulation as tolerated 4. I will continue following him with the medicine service

## 2015-05-16 NOTE — Progress Notes (Addendum)
PROGRESS NOTE    James [name] VKP:[id number] DOB: 06-01-1928 [name]: 05/15/2015 PCP: [name], MD  Primary Pulmonologist: Dr. Asencion Noble Primary Cardiologist: Dr. Loralie Champagne Primary Gastroenterologist: Dr. Silvano Rusk Primary oncologist: Dr. 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 On arrival to the ED he was found to be hypoxic and subsequently became febrile and noted to have leukocytosis. Evaluation in ED showed sodium 132, bicarbonate 15, creatinine 1.89, troponin 0.06, WBC 30.5, hemoglobin 11.4, BNP 157.2, elevated lactate, urine microscopy not impressive for UTI features, chest x-ray without acute cardiopulmonary disease. In the ED he had blood pressure 85/48, respiratory rate 27/m, temperature of 101F and was admitted to stepdown unit for presumed aspiration pneumonia/HCAP in the context of an immunocompromised host. Urinary streptococcal antigen positive. Oncology consulting.   Assessment/Plan:  Sepsis secondary to possible aspiration pneumonia/HCAP-may be strep pneumonia - Patient had large nonbloody emesis which started his symptoms.  - Met sepsis criteria on admission. - Initial chest x-ray was negative. Will repeat chest x-ray.  - Blood cultures 2: Negative to date.  - Urinary streptococcal antigen positive.  - Speech therapy has evaluated and recommend regular consistency diet and thin liquids but since patient has nausea, will start with  clear liquids and advance as tolerated.   - clinically improved (defervesced, blood pressures improved, lactate normalized, decreasing leukocytosis and clinically better). - Continue IV cefepime and vancomycin.     Acute on stage III chronic kidney disease - Baseline creatinine not entirely clear (? 1.4-1.7) - Acute renal failure secondary to dehydration and sepsis - Brief IV fluid hydration and follow BMP - Improving  Anion gap metabolic acidosis - Multifactorial secondary to acute on chronic kidney disease and lactic acidosis. AG on admission: 17 and bicarbonate was 15 - Improved. AG 12. Bicarbonate 18. Lactate has normalized.  Chronic anemia - Multifactorial: Chronic disease, chronic kidney disease, cancer chemotherapy and chronic GI blood loss - Baseline hemoglobin may be in the 8 g per DL range. - No overt bleeding reported. - Hemoglobin has dropped from 11.4 on admission >8.6-some of it may be dilutional - Follow CBC in a.m. and transfuse if hemoglobin less than 7 g per DL  Acute respiratory failure with hypoxia - Most likely related to aspiration following episode of large emesis - Low index of suspicion for pulmonary embolism even though it cannot be completely ruled out - Discussed at length with primary oncologist: Low index of suspicion for PE. Even if confirmed, we'll not be candidate for anticoagulation secondary to chronic GI bleed and transfusion-dependent anemia. Oncologist plans to discuss with patient and extended family regarding hospice and hence does not feel that IVC filter is appropriate either. Canceled VQ scan.  Chronic GI bleed/GERD - PPI  Chronic diastolic CHF - Clinically slightly dehydrated - Monitor closely while being gently hydrated area  Chronic prednisone use - Continue IV hydrocortisone for now  Marked leukocytosis - ? Secondary to sepsis and stress response. - Has not been on growth stimulating factors per oncology - Improving. Follow  CBCs  Hypothyroid - Continue Synthroid  CAD status post DES - Asymptomatic of chest pain.  History of left lower extremity DVT/portal vein thrombosis - Not on anticoagulants PTA secondary to chronic GI bleed/anemia  Duodenal cancer with liver metastatic disease -Status post 2 cycles of FOLFOX and was due for the third cycle today but overall performance status has significantly declined. Oncology input appreciated and may not be a candidate for further treatments. Oncologist plans to discuss with patient and family regarding hospice route of care.  Failure to thrive - Multifactorial  History of ITP - Platelet counts currently normal  Acute urinary retention - As per patient, initially had in and out but continued to have difficulty urinating and hence Foley catheter was placed.  Elevated troponin - No reported chest pain - Possibly from demand ischemia related to hypotension, sepsis, acute on chronic kidney disease   DVT prophylaxis: Heparin subcutaneously Code Status: Full Family Communication: None at bedside Disposition Plan: Continue Rx in SDU another 24 hours or until Oncology discusses with family and decides hospice direction of care.   Consultants:  Oncology  Procedures:  Foley catheter  Antibiotics:  IV cefepime 5/23 >  IV vancomycin 5/23 >  Subjective: Appears slightly confused. Cannot remember why he came to the hospital. Nausea but no vomiting or abdominal pain. Denies dyspnea, cough or chest pain.   Objective: Filed Vitals:   05/16/15 0400 05/16/15 0500 05/16/15 0600 05/16/15 0800  BP: 81/38 97/36 99/34  101/46  Pulse: 51 47 42 74  Temp: 97.7 F (36.5 C)   97.9 F (36.6 C)  TempSrc: Oral   Core (Comment)  Resp: 13 15 17 19   Height:      Weight:  90.2 kg (198 lb 13.7 oz)    SpO2: 96% 96% 98% 100%    Intake/Output Summary (Last 24 hours) at 05/16/15 1008 Last data filed at 05/16/15 0900  Gross per 24 hour  Intake 1386.67 ml  Output     540 ml  Net 846.67 ml   Filed Weights   05/15/15 2348 05/16/15 0500  Weight: 89.5 kg (197 lb 5 oz) 90.2 kg (198 lb 13.7 oz)     Exam:  General exam: Pleasant elderly male lying comfortably supine in bed. Respiratory system: Clear to auscultation . No increased work of breathing. Cardiovascular system: S1 & S2 heard, RRR. No JVD, murmurs, gallops, clicks or pedal edema. Telemetry: Sinus bradycardia in the 50s  Gastrointestinal system: Abdomen is nondistended, soft and nontender. Normal bowel sounds heard. Foley catheter: Mild hematuria  Central nervous system: Alert and oriented2 . No focal neurological deficits. Extremities: Symmetric 5 x 5 power.   Data Reviewed: Basic Metabolic Panel:  Recent Labs Lab 05/15/15 1643 05/16/15 0510  NA 132* 134*  K 4.4 4.6  CL 100* 104  CO2 15* 18*  GLUCOSE 148* 119*  BUN 21* 23*  CREATININE 1.89* 1.59*  CALCIUM 9.0 7.9*   Liver Function Tests:  Recent Labs Lab 05/15/15 1643 05/16/15 0510  AST 44* 35  ALT 57 44  ALKPHOS 284* 195*  BILITOT 1.1 0.7  PROT 6.9 5.2*  ALBUMIN 2.6* 2.0*   No results for input(s): LIPASE, AMYLASE in the last 168 hours. No results for input(s): AMMONIA in the last 168 hours. CBC:  Recent Labs Lab 05/15/15 1643 05/16/15 0510  WBC 30.5* 24.7*  NEUTROABS 28.1* 21.7*  HGB 11.4* 8.6*  HCT 36.0* 27.3*  MCV 88.2 88.1  PLT 438* 266   Cardiac Enzymes:  Recent Labs Lab 05/15/15 1643  TROPONINI 0.06*  BNP (last 3 results)  Recent Labs  06/06/14 1244 11/29/14 0944 03/22/15 1513  PROBNP 83.0 114.6 71.0   CBG: No results for input(s): GLUCAP in the last 168 hours.  Recent Results (from the past 240 hour(s))  TECHNOLOGIST REVIEW     Status: None   Collection Time: 05/09/15 10:04 AM  Result Value Ref Range Status   Technologist Review Variant lymphs, few giant plts present  Final  Blood culture (routine x 2)     Status: None (Preliminary result)   Collection Time: 05/15/15  6:41 PM   Result Value Ref Range Status   Specimen Description BLOOD RIGHT HAND  Final   Special Requests BOTTLES DRAWN AEROBIC AND ANAEROBIC 5 CCEACH  Final   Culture   Final           BLOOD CULTURE RECEIVED NO GROWTH TO DATE CULTURE WILL BE HELD FOR 5 DAYS BEFORE ISSUING A FINAL NEGATIVE REPORT Performed at [facility]    Report Status PENDING  Incomplete  Blood culture (routine x 2)     Status: None (Preliminary result)   Collection Time: 05/15/15  6:42 PM  Result Value Ref Range Status   Specimen Description BLOOD PORTA CATH CHEST  Final   Special Requests BOTTLES DRAWN AEROBIC AND ANAEROBIC 5CC EACH  Final   Culture   Final           BLOOD CULTURE RECEIVED NO GROWTH TO DATE CULTURE WILL BE HELD FOR 5 DAYS BEFORE ISSUING A FINAL NEGATIVE REPORT Performed at [facility]    Report Status PENDING  Incomplete  MRSA PCR Screening     Status: None   Collection Time: 05/15/15 11:48 PM  Result Value Ref Range Status   MRSA by PCR NEGATIVE NEGATIVE Final    Comment:        The GeneXpert MRSA Assay (FDA approved for NASAL specimens only), is one component of a comprehensive MRSA colonization surveillance program. It is not intended to diagnose MRSA infection nor to guide or monitor treatment for MRSA infections.           Studies: Dg Chest 2 View  05/15/2015   CLINICAL DATA:  Pt from home, complains of emesis and dizziness, denies weakness. Hx colon cancer.  EXAM: CHEST  2 VIEW  COMPARISON:  Cough 04/12/2015  FINDINGS: Cardiac silhouette mildly enlarged. No mediastinal or hilar masses or evidence of adenopathy.  Minor reticular scarring in the antral lateral lung bases. This is unchanged. No lung consolidation or edema. No pleural effusion or pneumothorax.  Right anterior chest wall Port-A-Cath is stable.  Bony thorax is intact.  IMPRESSION: No acute cardiopulmonary disease.   Electronically Signed   By: Lajean Manes M.D.   On: 05/15/2015 17:08        Scheduled  Meds: . antiseptic oral rinse  7 mL Mouth Rinse q12n4p  . ceFEPime (MAXIPIME) IV  2 g Intravenous Q12H  . chlorhexidine  15 mL Mouth Rinse BID  . heparin  5,000 Units Subcutaneous 3 times per day  . hydrocortisone   Rectal TID  . hydrocortisone sod succinate (SOLU-CORTEF) inj  50 mg Intravenous Q8H  . levothyroxine  25 mcg Oral QAC breakfast  . pantoprazole (PROTONIX) IV  40 mg Intravenous Q12H  . vancomycin  1,250 mg Intravenous Q24H   Continuous Infusions: . 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    Vernell Leep, MD, FACP, FHM. [facility] Pager [phone]  If 7PM-7AM, please contact night-coverage [email or web address] Password [email or web address] 05/16/2015, 10:08 AM    LOS: 1 day

## 2015-05-17 DIAGNOSIS — D500 Iron deficiency anemia secondary to blood loss (chronic): Secondary | ICD-10-CM

## 2015-05-17 DIAGNOSIS — D638 Anemia in other chronic diseases classified elsewhere: Secondary | ICD-10-CM

## 2015-05-17 DIAGNOSIS — I5032 Chronic diastolic (congestive) heart failure: Secondary | ICD-10-CM

## 2015-05-17 DIAGNOSIS — R001 Bradycardia, unspecified: Secondary | ICD-10-CM

## 2015-05-17 DIAGNOSIS — K766 Portal hypertension: Secondary | ICD-10-CM

## 2015-05-17 DIAGNOSIS — Z86718 Personal history of other venous thrombosis and embolism: Secondary | ICD-10-CM

## 2015-05-17 DIAGNOSIS — A419 Sepsis, unspecified organism: Secondary | ICD-10-CM | POA: Insufficient documentation

## 2015-05-17 DIAGNOSIS — R0602 Shortness of breath: Secondary | ICD-10-CM | POA: Insufficient documentation

## 2015-05-17 DIAGNOSIS — J189 Pneumonia, unspecified organism: Secondary | ICD-10-CM

## 2015-05-17 DIAGNOSIS — I10 Essential (primary) hypertension: Secondary | ICD-10-CM

## 2015-05-17 DIAGNOSIS — I959 Hypotension, unspecified: Secondary | ICD-10-CM

## 2015-05-17 LAB — CBC WITH DIFFERENTIAL/PLATELET
BASOS ABS: 0 10*3/uL (ref 0.0–0.1)
Basophils Relative: 0 % (ref 0–1)
Eosinophils Absolute: 0 10*3/uL (ref 0.0–0.7)
Eosinophils Relative: 0 % (ref 0–5)
HEMATOCRIT: 24.1 % — AB (ref 39.0–52.0)
Hemoglobin: 7.7 g/dL — ABNORMAL LOW (ref 13.0–17.0)
LYMPHS ABS: 0.9 10*3/uL (ref 0.7–4.0)
LYMPHS PCT: 5 % — AB (ref 12–46)
MCH: 28.1 pg (ref 26.0–34.0)
MCHC: 32 g/dL (ref 30.0–36.0)
MCV: 88 fL (ref 78.0–100.0)
Monocytes Absolute: 1.1 10*3/uL — ABNORMAL HIGH (ref 0.1–1.0)
Monocytes Relative: 6 % (ref 3–12)
Neutro Abs: 16.1 10*3/uL — ABNORMAL HIGH (ref 1.7–7.7)
Neutrophils Relative %: 89 % — ABNORMAL HIGH (ref 43–77)
PLATELETS: 214 10*3/uL (ref 150–400)
RBC: 2.74 MIL/uL — ABNORMAL LOW (ref 4.22–5.81)
RDW: 19.1 % — AB (ref 11.5–15.5)
WBC: 18.1 10*3/uL — ABNORMAL HIGH (ref 4.0–10.5)

## 2015-05-17 LAB — BASIC METABOLIC PANEL
Anion gap: 8 (ref 5–15)
BUN: 23 mg/dL — AB (ref 6–20)
CO2: 16 mmol/L — AB (ref 22–32)
CREATININE: 1.44 mg/dL — AB (ref 0.61–1.24)
Calcium: 7.4 mg/dL — ABNORMAL LOW (ref 8.9–10.3)
Chloride: 109 mmol/L (ref 101–111)
GFR, EST AFRICAN AMERICAN: 50 mL/min — AB (ref >60)
GFR, EST NON AFRICAN AMERICAN: 43 mL/min — AB (ref >60)
GLUCOSE: 124 mg/dL — AB (ref 65–99)
POTASSIUM: 4.5 mmol/L (ref 3.5–5.1)
Sodium: 133 mmol/L — ABNORMAL LOW (ref 135–145)

## 2015-05-17 LAB — LEGIONELLA ANTIGEN, URINE

## 2015-05-17 MED ORDER — SODIUM CHLORIDE 0.9 % IV BOLUS (SEPSIS)
500.0000 mL | Freq: Once | INTRAVENOUS | Status: AC
  Administered 2015-05-17: 500 mL via INTRAVENOUS

## 2015-05-17 MED ORDER — VANCOMYCIN HCL IN DEXTROSE 750-5 MG/150ML-% IV SOLN
750.0000 mg | Freq: Two times a day (BID) | INTRAVENOUS | Status: DC
  Administered 2015-05-17 – 2015-05-18 (×2): 750 mg via INTRAVENOUS
  Filled 2015-05-17 (×4): qty 150

## 2015-05-17 NOTE — Progress Notes (Signed)
ANTIBIOTIC CONSULT NOTE - Follow up  Pharmacy Consult for Vancomycin, Cefepime Indication: rule out sepsis  Allergies  Allergen Reactions  . Sulfa Antibiotics Other (See Comments)    Caused patient have ITP  . Aleve [Naproxen Sodium] Other (See Comments)    Causes platlet drop  . Rofecoxib Other (See Comments)    Celebrex  - affected low platelets  . Sulfamethoxazole-Trimethoprim Other (See Comments)     Causes low platelets and bleeding  . Tape Other (See Comments)    Adhesive tape makes skin tear.  . Tramadol Hcl Other (See Comments)    dizziness, drowsiness  . Neomycin-Bacitracin Zn-Polymyx Rash  . Neosporin [Neomycin-Polymyxin-Gramicidin] Rash  . Penicillins Swelling and Rash    Took dose of amoxicillin 2000mg  02/02/15 at dentist office without any complications    Patient Measurements: Height: 6' (182.9 cm) Weight: 210 lb 5.1 oz (95.4 kg) IBW/kg (Calculated) : 77.6  Vital Signs: Temp: 97.5 F (36.4 C) (05/25 1300) Temp Source: Core (Comment) (05/25 0800) BP: 108/40 mmHg (05/25 1300) Pulse Rate: 42 (05/25 0800) Intake/Output from previous day: 05/24 0701 - 05/25 0700 In: 3156.3 [P.O.:640; I.V.:1691.3; IV Piggyback:350] Out: 620 [Urine:620] Intake/Output from this shift: Total I/O In: 425 [I.V.:75; Other:300; IV Piggyback:50] Out: -   Labs:  Recent Labs  05/15/15 1643 05/16/15 0510 05/17/15 0345  WBC 30.5* 24.7* 18.1*  HGB 11.4* 8.6* 7.7*  PLT 438* 266 214  CREATININE 1.89* 1.59* 1.44*   Estimated Creatinine Clearance: 45.7 mL/min (by C-G formula based on Cr of 1.44). No results for input(s): VANCOTROUGH, VANCOPEAK, VANCORANDOM, GENTTROUGH, GENTPEAK, GENTRANDOM, TOBRATROUGH, TOBRAPEAK, TOBRARND, AMIKACINPEAK, AMIKACINTROU, AMIKACIN in the last 72 hours.    Assessment: 23 yoM admitted 5/23 with vomiting and dizziness.  Significant PMH includes colon cancer with ongoing chemo (last FOLFOX given 5/2, next due 5/17 but delayed until 5/24).  Pharmacy is  consulted to dose Vancomycin and Cefepime for suspected sepsis.  Penicillin allergy (swelling, rash) is noted and discussed with EDP, Dr. Doy Mince, but pt has tolerated amoxicillin and cefotetan in the past.   5/23 >> Vanc >> 5/23 >> Cefepime >>    Today, 05/17/2015:  Afebrile  WBC: elevated but improving, 18.1  Renal: SCr improved to 1.44, CrC ~ 45 ml/min CG  Note Strep pneumonia urinary Ag: POSITIVE  Goal of Therapy:  Vancomycin trough level 15-20 mcg/ml Appropriate abx dosing, eradication of infection.   Plan:   Continue Cefepime 2g IV q12h  Increase to Vancomycin 750 mg IV q12h.  Measure Vanc trough at steady state.  Follow up renal fxn, culture results, and clinical course.   Gretta Arab PharmD, BCPS Pager [phone] 05/17/2015 1:08 PM

## 2015-05-17 NOTE — Progress Notes (Signed)
Patient A/Ox3. He is on room air. Pt.had low blood pressure and also  sinus bradycardia during the shift. Lowest HR was 38 at rest. Pt.was asymptomatic. Paged on call Mid-level provider with [facility] throughout the shift to notify of blood pressures and heart rate. New orders were written. Pt.hemglobin this am was 7.7, paged Mid-level with [facility] to notify.

## 2015-05-17 NOTE — Progress Notes (Signed)
[facility] PROGRESS NOTE  AYOUB AREY BPP:[id number] DOB: 21-May-1928 [name]: 05/15/2015 PCP: [name], MD  Assessment/Plan: Sepsis secondary to possible aspiration pneumonia/HCAP-may be strep pneumonia - Patient reportedly had large nonbloody emesis prior to onset of symptoms.  - Serial CXR unremarkable.  - Blood cultures neg x 2 thus far - Urinary streptococcal antigen positive.  - Speech therapy has evaluated and recommend regular consistency diet. On heart healthy currently - clinically improved with improved leukocytosis - Pt is continued on IV cefepime and vancomycin.   Acute on stage III chronic kidney disease - Baseline creatinine not entirely clear (possibly 1.4-1.7) - Acute renal failure secondary to dehydration and sepsis - Improving  Anion gap metabolic acidosis - Multifactorial secondary to acute on chronic kidney disease and lactic acidosis. AG on admission: 17 and bicarbonate was 15 - Lactate has normalized.  Chronic anemia - Multifactorial: Chronic disease, chronic kidney disease, cancer chemotherapy and chronic GI blood loss - Baseline hemoglobin may be around 8 - No overt bleeding reported. - Hemoglobin has dropped from 11.4 on admission >7.7 - Cont to follow hgb and plan to transfuse if hemoglobin less than 7  Acute respiratory failure with hypoxia - Most likely related to aspiration following episode of large emesis - Low index of suspicion for pulmonary embolism even though it cannot be completely ruled out - Low index of suspicion for PE. Even if confirmed, pt is not candidate for anticoagulation secondary to chronic GI bleed and transfusion-dependent anemia. Oncologist to discuss with patient and extended family regarding hospice and hence does not feel that IVC filter is appropriate either.  Chronic GI bleed/GERD - PPI  Chronic diastolic CHF - Clinically slightly dehydrated - Monitor closely while being gently hydrated area  Chronic  prednisone use - Continue IV hydrocortisone for now  Marked leukocytosis - Suspect secondary to sepsis - Has not been on growth stimulating factors per oncology - Improving.  Hypothyroid - Continue Synthroid  CAD status post DES - Asymptomatic  History of left lower extremity DVT/portal vein thrombosis - Not on anticoagulants PTA secondary to chronic GI bleed/anemia  Duodenal cancer with liver metastatic disease -Status post 2 cycles of FOLFOX and was due for the third cycle 5/24 but overall performance status has significantly declined. Oncology input appreciated and may not be a candidate for further treatments. Oncologist to discuss with patient and family regarding possible hospice  Failure to thrive - Multifactorial  History of ITP - Platelet counts currently normal  Acute urinary retention - As per patient, initially had in and out but continued to have difficulty urinating and hence Foley catheter was placed.  Elevated troponin - No reported chest pain - Suspect from demand ischemia related to hypotension, sepsis, acute on chronic kidney disease  Hypotension - Noted overnight - Responds well to IVF hydration  Bradycardia - Asymptomatic - Not on beta blockers  Code Status: Full Family Communication: Pt in room, daughter at bedside Disposition Plan: Pending   Consultants:  Oncology  Procedures:    Antibiotics:  Cefepime 5/23>>>  Vancomycin 5/23>>>   HPI/Subjective: Eager to go home. Denies chest pain. Reports difficulty sleeping overnight  Objective: Filed Vitals:   05/17/15 1200 05/17/15 1300 05/17/15 1400 05/17/15 1500  BP: 89/34 108/40 101/50 115/50  Pulse:      Temp: 97.3 F (36.3 C) 97.5 F (36.4 C) 97.3 F (36.3 C) 97.5 F (36.4 C)  TempSrc:      Resp: 20 23 21 21   Height:  Weight:      SpO2: 98% 100% 98% 100%    Intake/Output Summary (Last 24 hours) at 05/17/15 1522 Last data filed at 05/17/15 1200  Gross per 24 hour   Intake 2527.5 ml  Output    250 ml  Net 2277.5 ml   Filed Weights   05/15/15 2348 05/16/15 0500 05/17/15 0430  Weight: 89.5 kg (197 lb 5 oz) 90.2 kg (198 lb 13.7 oz) 95.4 kg (210 lb 5.1 oz)    Exam:   General:  Awake, in nad  Cardiovascular: regular, s1, s2  Respiratory: normal resp effort, no wheezing  Abdomen: soft,nondistended  Musculoskeletal: perfused, no clubbing   Data Reviewed: Basic Metabolic Panel:  Recent Labs Lab 05/15/15 1643 05/16/15 0510 05/17/15 0345  NA 132* 134* 133*  K 4.4 4.6 4.5  CL 100* 104 109  CO2 15* 18* 16*  GLUCOSE 148* 119* 124*  BUN 21* 23* 23*  CREATININE 1.89* 1.59* 1.44*  CALCIUM 9.0 7.9* 7.4*   Liver Function Tests:  Recent Labs Lab 05/15/15 1643 05/16/15 0510  AST 44* 35  ALT 57 44  ALKPHOS 284* 195*  BILITOT 1.1 0.7  PROT 6.9 5.2*  ALBUMIN 2.6* 2.0*   No results for input(s): LIPASE, AMYLASE in the last 168 hours. No results for input(s): AMMONIA in the last 168 hours. CBC:  Recent Labs Lab 05/15/15 1643 05/16/15 0510 05/17/15 0345  WBC 30.5* 24.7* 18.1*  NEUTROABS 28.1* 21.7* 16.1*  HGB 11.4* 8.6* 7.7*  HCT 36.0* 27.3* 24.1*  MCV 88.2 88.1 88.0  PLT 438* 266 214   Cardiac Enzymes:  Recent Labs Lab 05/15/15 1643  TROPONINI 0.06*   BNP (last 3 results)  Recent Labs  05/15/15 1644  BNP 157.2*    ProBNP (last 3 results)  Recent Labs  06/06/14 1244 11/29/14 0944 03/22/15 1513  PROBNP 83.0 114.6 71.0    CBG: No results for input(s): GLUCAP in the last 168 hours.  Recent Results (from the past 240 hour(s))  TECHNOLOGIST REVIEW     Status: None   Collection Time: 05/09/15 10:04 AM  Result Value Ref Range Status   Technologist Review Variant lymphs, few giant plts present  Final  Blood culture (routine x 2)     Status: None (Preliminary result)   Collection Time: 05/15/15  6:41 PM  Result Value Ref Range Status   Specimen Description BLOOD RIGHT HAND  Final   Special Requests BOTTLES  DRAWN AEROBIC AND ANAEROBIC 5 CCEACH  Final   Culture   Final           BLOOD CULTURE RECEIVED NO GROWTH TO DATE CULTURE WILL BE HELD FOR 5 DAYS BEFORE ISSUING A FINAL NEGATIVE REPORT Performed at [facility]    Report Status PENDING  Incomplete  Blood culture (routine x 2)     Status: None (Preliminary result)   Collection Time: 05/15/15  6:42 PM  Result Value Ref Range Status   Specimen Description BLOOD PORTA CATH CHEST  Final   Special Requests BOTTLES DRAWN AEROBIC AND ANAEROBIC 5CC EACH  Final   Culture   Final           BLOOD CULTURE RECEIVED NO GROWTH TO DATE CULTURE WILL BE HELD FOR 5 DAYS BEFORE ISSUING A FINAL NEGATIVE REPORT Performed at [facility]    Report Status PENDING  Incomplete  MRSA PCR Screening     Status: None   Collection Time: 05/15/15 11:48 PM  Result Value Ref Range Status  MRSA by PCR NEGATIVE NEGATIVE Final    Comment:        The GeneXpert MRSA Assay (FDA approved for NASAL specimens only), is one component of a comprehensive MRSA colonization surveillance program. It is not intended to diagnose MRSA infection nor to guide or monitor treatment for MRSA infections.   Culture, sputum-assessment     Status: None   Collection Time: 05/16/15  9:24 AM  Result Value Ref Range Status   Specimen Description SPUTUM  Final   Special Requests NONE  Final   Sputum evaluation   Final    MICROSCOPIC FINDINGS SUGGEST THAT THIS SPECIMEN IS NOT REPRESENTATIVE OF LOWER RESPIRATORY SECRETIONS. PLEASE RECOLLECT. INFORMED SHEPHERD,G. RN @1015  ON 5.24.16 BY MCCOY,N.    Report Status 05/16/2015 FINAL  Final     Studies: Dg Chest 2 View  05/16/2015   CLINICAL DATA:  Shortness of breath with fever and chills. Metastatic duodenum cancer  EXAM: CHEST  2 VIEW  COMPARISON:  May 15, 2015  FINDINGS: Port-A-Cath tip is in the superior vena cava. No pneumothorax. There is no edema or consolidation. There is slight bibasilar lung scarring. Heart is upper  limits normal in size with pulmonary vascularity within normal limits. No adenopathy. No bone lesions.  IMPRESSION: Slight bibasilar lung scarring.  No edema or consolidation.   Electronically Signed   By: Lowella Grip III M.D.   On: 05/16/2015 11:46   Dg Chest 2 View  05/15/2015   CLINICAL DATA:  Pt from home, complains of emesis and dizziness, denies weakness. Hx colon cancer.  EXAM: CHEST  2 VIEW  COMPARISON:  Cough 04/12/2015  FINDINGS: Cardiac silhouette mildly enlarged. No mediastinal or hilar masses or evidence of adenopathy.  Minor reticular scarring in the antral lateral lung bases. This is unchanged. No lung consolidation or edema. No pleural effusion or pneumothorax.  Right anterior chest wall Port-A-Cath is stable.  Bony thorax is intact.  IMPRESSION: No acute cardiopulmonary disease.   Electronically Signed   By: Lajean Manes M.D.   On: 05/15/2015 17:08    Scheduled Meds: . antiseptic oral rinse  7 mL Mouth Rinse q12n4p  . ceFEPime (MAXIPIME) IV  2 g Intravenous Q12H  . chlorhexidine  15 mL Mouth Rinse BID  . heparin  5,000 Units Subcutaneous 3 times per day  . hydrocortisone   Rectal TID  . hydrocortisone sod succinate (SOLU-CORTEF) inj  50 mg Intravenous Q8H  . levothyroxine  25 mcg Oral QAC breakfast  . pantoprazole (PROTONIX) IV  40 mg Intravenous Q12H  . 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 If 7PM-7AM, please contact night-coverage at [email or web address], password [email or web address] 05/17/2015, 3:22 PM  LOS: 2 days

## 2015-05-17 NOTE — Progress Notes (Signed)
PT Cancellation Note  Patient Details Name: James [name] MRN: [id number] DOB: 25-Mar-1928   Cancelled Treatment:    Reason Eval/Treat Not Completed: Medical issues which prohibited therapy (RN reports HR 30s-40s)   Turkessa Ostrom,KATHrine E 05/17/2015, 9:37 AM Carmelia Bake, PT, DPT 05/17/2015 Pager: [phone]

## 2015-05-17 NOTE — Progress Notes (Addendum)
IP PROGRESS NOTE  Subjective:   He is alert. He reports having a rough night secondary to low blood pressure. He denies nausea, dyspnea, and pain.  Objective: Vital signs in last 24 hours: Blood pressure 108/40, pulse 42, temperature 97.5 F (36.4 C), temperature source Core (Comment), resp. rate 23, height 6' (1.829 m), weight 210 lb 5.1 oz (95.4 kg), SpO2 100 %.  Intake/Output from previous day: 05/24 0701 - 05/25 0700 In: 3156.3 [P.O.:640; I.V.:1691.3; IV Piggyback:350] Out: 620 [Urine:620]  Physical Exam:  HEENT: No thrush Lungs: Clear anteriorly Cardiac: Bradycardia, irregular Abdomen: The liver edge is palpable in the lateral right upper abdomen, nontender Extremities: The left leg appears slightly larger than the right leg without edema or erythema  Portacath/PICC-without erythema  Lab Results:  Recent Labs  05/16/15 0510 05/17/15 0345  WBC 24.7* 18.1*  HGB 8.6* 7.7*  HCT 27.3* 24.1*  PLT 266 214    BMET  Recent Labs  05/16/15 0510 05/17/15 0345  NA 134* 133*  K 4.6 4.5  CL 104 109  CO2 18* 16*  GLUCOSE 119* 124*  BUN 23* 23*  CREATININE 1.59* 1.44*  CALCIUM 7.9* 7.4*    Studies/Results: Dg Chest 2 View  05/16/2015   CLINICAL DATA:  Shortness of breath with fever and chills. Metastatic duodenum cancer  EXAM: CHEST  2 VIEW  COMPARISON:  May 15, 2015  FINDINGS: Port-A-Cath tip is in the superior vena cava. No pneumothorax. There is no edema or consolidation. There is slight bibasilar lung scarring. Heart is upper limits normal in size with pulmonary vascularity within normal limits. No adenopathy. No bone lesions.  IMPRESSION: Slight bibasilar lung scarring.  No edema or consolidation.   Electronically Signed   By: Lowella Grip III M.D.   On: 05/16/2015 11:46   Dg Chest 2 View  05/15/2015   CLINICAL DATA:  Pt from home, complains of emesis and dizziness, denies weakness. Hx colon cancer.  EXAM: CHEST  2 VIEW  COMPARISON:  Cough 04/12/2015   FINDINGS: Cardiac silhouette mildly enlarged. No mediastinal or hilar masses or evidence of adenopathy.  Minor reticular scarring in the antral lateral lung bases. This is unchanged. No lung consolidation or edema. No pleural effusion or pneumothorax.  Right anterior chest wall Port-A-Cath is stable.  Bony thorax is intact.  IMPRESSION: No acute cardiopulmonary disease.   Electronically Signed   By: Lajean Manes M.D.   On: 05/15/2015 17:08    Medications: I have reviewed the patient's current medications.  Assessment/Plan:  1. 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 Acute renal failure-likely related to dehydration, improved  3.  Anemia secondary to chemotherapy, chronic disease, and chronic GI blood loss, transfused for symptomatic anemia  4. Admission 05/15/2015 with a fever and altered mental status-potentially related to a systemic infection, cultures negative to date  5. History of a left leg DVT and portal vein thrombosis, off of anticoagulation therapy secondary to GI bleeding   His mental status appears at baseline today. He has hypotension and an bradycardia this morning.  Recommendations: 1. Continue antibodies, follow-up cultures 2. Management of hypotension/bradycardia per medical service 3. Systemic chemotherapy for treatment of the small bowel carcinoma remains on hold 4. Consider stopping the heparin prophylaxis and using SCDs with his history of GI bleeding  His daughter is not present this morning. I will check on him again 05/18/2015 to discuss the overall prognosis with his family.   LOS: 2 days   [place], [place]  05/17/2015, 2:58 PM

## 2015-05-18 ENCOUNTER — Telehealth (HOSPITAL_COMMUNITY): Payer: Self-pay | Admitting: *Deleted

## 2015-05-18 DIAGNOSIS — I2510 Atherosclerotic heart disease of native coronary artery without angina pectoris: Secondary | ICD-10-CM

## 2015-05-18 DIAGNOSIS — L8990 Pressure ulcer of unspecified site, unspecified stage: Secondary | ICD-10-CM | POA: Insufficient documentation

## 2015-05-18 DIAGNOSIS — Z9861 Coronary angioplasty status: Secondary | ICD-10-CM

## 2015-05-18 LAB — CBC
HCT: 26.3 % — ABNORMAL LOW (ref 39.0–52.0)
Hemoglobin: 8.2 g/dL — ABNORMAL LOW (ref 13.0–17.0)
MCH: 27.6 pg (ref 26.0–34.0)
MCHC: 31.2 g/dL (ref 30.0–36.0)
MCV: 88.6 fL (ref 78.0–100.0)
Platelets: 206 10*3/uL (ref 150–400)
RBC: 2.97 MIL/uL — ABNORMAL LOW (ref 4.22–5.81)
RDW: 19.4 % — AB (ref 11.5–15.5)
WBC: 15.2 10*3/uL — ABNORMAL HIGH (ref 4.0–10.5)

## 2015-05-18 LAB — BASIC METABOLIC PANEL
Anion gap: 9 (ref 5–15)
BUN: 21 mg/dL — ABNORMAL HIGH (ref 6–20)
CO2: 17 mmol/L — ABNORMAL LOW (ref 22–32)
Calcium: 8 mg/dL — ABNORMAL LOW (ref 8.9–10.3)
Chloride: 108 mmol/L (ref 101–111)
Creatinine, Ser: 1.25 mg/dL — ABNORMAL HIGH (ref 0.61–1.24)
GFR calc Af Amer: 59 mL/min — ABNORMAL LOW (ref >60)
GFR calc non Af Amer: 51 mL/min — ABNORMAL LOW (ref >60)
Glucose, Bld: 121 mg/dL — ABNORMAL HIGH (ref 65–99)
Potassium: 4.2 mmol/L (ref 3.5–5.1)
Sodium: 134 mmol/L — ABNORMAL LOW (ref 135–145)

## 2015-05-18 LAB — PREALBUMIN: Prealbumin: 15.6 mg/dL — ABNORMAL LOW (ref 18–38)

## 2015-05-18 LAB — T4, FREE: Free T4: 0.44 ng/dL — ABNORMAL LOW (ref 0.61–1.12)

## 2015-05-18 MED ORDER — LEVOFLOXACIN 750 MG PO TABS
750.0000 mg | ORAL_TABLET | Freq: Every day | ORAL | Status: DC
  Administered 2015-05-18 – 2015-05-20 (×3): 750 mg via ORAL
  Filled 2015-05-18 (×3): qty 1

## 2015-05-18 NOTE — Progress Notes (Signed)
IP PROGRESS NOTE  Subjective:   He is alert. He denies pain. He is eating breakfast.  Objective: Vital signs in last 24 hours: Blood pressure 124/54, pulse 42, temperature 98.2 F (36.8 C), temperature source Core (Comment), resp. rate 24, height 6' (1.829 m), weight 210 lb 5.1 oz (95.4 kg), SpO2 99 %.  Intake/Output from previous day: 05/25 0701 - 05/26 0700 In: 325 [I.V.:75; IV Piggyback:250] Out: 0321 [Urine:1275]  Physical Exam:  HEENT: No thrush Lungs: Clear anteriorly Cardiac: Bradycardia, irregular Abdomen: The liver edge is palpable in the lateral right upper abdomen, nontender Extremities: The left leg appears slightly larger than the right leg without edema . Support stockings in place.  Portacath/PICC-without erythema  Lab Results:  Recent Labs  05/17/15 0345 05/18/15 0550  WBC 18.1* 15.2*  HGB 7.7* 8.2*  HCT 24.1* 26.3*  PLT 214 206    BMET  Recent Labs  05/17/15 0345 05/18/15 0550  NA 133* 134*  K 4.5 4.2  CL 109 108  CO2 16* 17*  GLUCOSE 124* 121*  BUN 23* 21*  CREATININE 1.44* 1.25*  CALCIUM 7.4* 8.0*     Medications: I have reviewed the patient's current medications.  Assessment/Plan:  1. 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 Acute renal failure-likely related to dehydration, improved  3.  Anemia secondary to chemotherapy, chronic disease, and chronic GI blood loss, transfused for symptomatic anemia  4. Admission 05/15/2015 with a fever and altered mental status-potentially related to a systemic infection, one blood culture positive for  "gram variable rods "and urine streptococcus pneumoniae antigen positive  5. History of a left leg DVT and portal vein thrombosis, off of anticoagulation therapy secondary to GI bleeding  6. Bradycardia-asymptomatic  His mental status appears at baseline. It is unclear whether he will be a to receive additional treatment for the metastatic small bowel carcinoma.  I discussed the situation with his daughter.  Recommendations: 1. Continue anti-biotics 2. Increase ambulation as tolerated. 3. Systemic chemotherapy for treatment of the small bowel carcinoma remains on hold 4. Follow-up 05/23/2015 at the [facility] as scheduled   LOS: 3 days   [facility], [facility] Severin  05/18/2015, 2:15 PM

## 2015-05-18 NOTE — Evaluation (Signed)
Physical Therapy Evaluation Patient Details Name: James [name] MRN: [id number] DOB: 30-Nov-1928 Today's Date: 05/18/2015   History of Present Illness  Third admission this year for this 81 yr old maile.  Pt admitted with vomitting and dx with aspiration pna  Clinical Impression  Pt admitted as above and presenting with functional mobility limitations 2* generalized weakness, SOB with exertion and mild ambulatory instability.  Pt should progress to dc home with family assist and HHPT follow up dependent on acute stay progress.    Follow Up Recommendations Home health PT;No PT follow up (dependent on acute stay progress)    Equipment Recommendations  None recommended by PT    Recommendations for Other Services OT consult     Precautions / Restrictions Precautions Precautions: Fall Restrictions Weight Bearing Restrictions: No      Mobility  Bed Mobility Overal bed mobility: Needs Assistance Bed Mobility: Supine to Sit     Supine to sit: Min assist;Mod assist     General bed mobility comments: pt able to bring LEs over EOB but requiring assist to bring trunk to upright.  Pt ltd by scrotal discomfort  Transfers Overall transfer level: Needs assistance Equipment used: Rolling walker (2 wheeled) Transfers: Sit to/from Stand Sit to Stand: From elevated surface;Min assist         General transfer comment: cues for transition position and use of UEs to self assist  Ambulation/Gait Ambulation/Gait assistance: Min assist Ambulation Distance (Feet): 200 Feet Assistive device: Rolling walker (2 wheeled) Gait Pattern/deviations: Step-through pattern;Shuffle;Trunk flexed Gait velocity: cues to slow pace 2* SOB   General Gait Details: cues for posture, pace and position from RW.  Pt with notable SOB while ambulating  Stairs            Wheelchair Mobility    Modified Rankin (Stroke Patients Only)       Balance                                              Pertinent Vitals/Pain Pain Assessment: 0-10 Pain Score: 3  Pain Location: "backend" with sitting Pain Descriptors / Indicators: Tender Pain Intervention(s): Limited activity within patient's tolerance;Other (comment) (RN aware, pt to limit sitting based on discomfort)    Home Living Family/patient expects to be discharged to:: Private residence Living Arrangements: Other relatives Available Help at Discharge: Family Type of Home: House Home Access: Level entry     Home Layout: Able to live on main level with bedroom/bathroom;Two level Home Equipment: Cane - single point;Walker - 2 wheels Additional Comments: Pt is assisted by dtr and multiple sisters    Prior Function Level of Independence: Independent;Independent with assistive device(s)         Comments: Pt states used cane more than RW     Hand Dominance   Dominant Hand: Left    Extremity/Trunk Assessment   Upper Extremity Assessment: Generalized weakness           Lower Extremity Assessment: Generalized weakness (L LE weaker vs R LE)      Cervical / Trunk Assessment: Normal  Communication   Communication: No difficulties  Cognition Arousal/Alertness: Awake/alert Behavior During Therapy: WFL for tasks assessed/performed Overall Cognitive Status: Within Functional Limits for tasks assessed                      General Comments  Exercises General Exercises - Lower Extremity Ankle Circles/Pumps: AROM;Both;Supine;20 reps      Assessment/Plan    PT Assessment Patient needs continued PT services  PT Diagnosis Difficulty walking   PT Problem List Decreased strength;Decreased activity tolerance;Decreased balance;Decreased mobility;Decreased knowledge of use of DME;Pain  PT Treatment Interventions DME instruction;Gait training;Functional mobility training;Therapeutic activities;Therapeutic exercise;Patient/family education;Balance training   PT Goals (Current goals can be found  in the Care Plan section) Acute Rehab PT Goals Patient Stated Goal: Resume previous lifestyle and regain as much IND as possible PT Goal Formulation: With patient Time For Goal Achievement: 06/01/15 Potential to Achieve Goals: Good    Frequency Min 3X/week   Barriers to discharge        Co-evaluation               End of Session Equipment Utilized During Treatment: Gait belt Activity Tolerance: Patient tolerated treatment well Patient left: in chair;with call bell/phone within reach;with family/visitor present Nurse Communication: Mobility status         Time: 2725-3664 PT Time Calculation (min) (ACUTE ONLY): 31 min   Charges:   PT Evaluation $Initial PT Evaluation Tier I: 1 Procedure PT Treatments $Gait Training: 8-22 mins   PT G Codes:        Bryella Diviney 2015-06-15, 2:35 PM

## 2015-05-18 NOTE — Telephone Encounter (Signed)
Please call daughter back and let her know that we are not able to go over to [facility] but that I will have the cardiologist who is rounding at [facility] for the week see him.

## 2015-05-18 NOTE — Progress Notes (Signed)
ANTIBIOTIC CONSULT NOTE - Follow up  Pharmacy Consult for Levaquin Indication: HCAP  Allergies  Allergen Reactions  . Sulfa Antibiotics Other (See Comments)    Caused patient have ITP  . Aleve [Naproxen Sodium] Other (See Comments)    Causes platlet drop  . Rofecoxib Other (See Comments)    Celebrex  - affected low platelets  . Sulfamethoxazole-Trimethoprim Other (See Comments)     Causes low platelets and bleeding  . Tape Other (See Comments)    Adhesive tape makes skin tear.  . Tramadol Hcl Other (See Comments)    dizziness, drowsiness  . Neomycin-Bacitracin Zn-Polymyx Rash  . Neosporin [Neomycin-Polymyxin-Gramicidin] Rash  . Penicillins Swelling and Rash    Took dose of amoxicillin 2000mg  02/02/15 at dentist office without any complications    Patient Measurements: Height: 6' (182.9 cm) Weight: 210 lb 5.1 oz (95.4 kg) IBW/kg (Calculated) : 77.6  Vital Signs: Temp: 97.3 F (36.3 C) (05/26 0900) Temp Source: Core (Comment) (05/26 0800) BP: 129/48 mmHg (05/26 0900) Intake/Output from previous day: 05/25 0701 - 05/26 0700 In: 325 [I.V.:75; IV Piggyback:250] Out: 1275 [Urine:1275]  Labs:  Recent Labs  05/16/15 0510 05/17/15 0345 05/18/15 0550  WBC 24.7* 18.1* 15.2*  HGB 8.6* 7.7* 8.2*  PLT 266 214 206  CREATININE 1.59* 1.44* 1.25*   Estimated Creatinine Clearance: 52.7 mL/min (by C-G formula based on Cr of 1.25). No results for input(s): VANCOTROUGH, VANCOPEAK, VANCORANDOM, GENTTROUGH, GENTPEAK, GENTRANDOM, TOBRATROUGH, TOBRAPEAK, TOBRARND, AMIKACINPEAK, AMIKACINTROU, AMIKACIN in the last 72 hours.    Assessment: 42 yoM admitted 5/23 with vomiting and dizziness.  Significant PMH includes colon cancer with ongoing chemo (last FOLFOX given 5/2, next due 5/17 but delayed until 5/24).  Pharmacy was initially consulted to dose Vancomycin and Cefepime for suspected sepsis.  Penicillin allergy (swelling, rash) is noted and discussed with EDP, Dr. Doy Mince, but pt has  tolerated amoxicillin and cefotetan in the past.  Pharmacy is now consulted for Oral Levaquin dosing.  5/23 >> Vanc >> 5/26 5/23 >> Cefepime >> 5/26 5/26 >> Levaquin >>  Today, 05/18/2015:  Afebrile  WBC: elevated but improving, 15.2  Renal: SCr improved to 1.25, CrC ~ 52.7 ml/min CG  Note Strep pneumonia urinary Ag: POSITIVE  Goal of Therapy:  Vancomycin trough level 15-20 mcg/ml Appropriate abx dosing, eradication of infection.   Plan:   Levaquin 750mg  PO daily.  Follow up renal fxn, culture results, and clinical course.  Pharmacy to s/o further note writing, but will f/u peripherally as needed.   Gretta Arab PharmD, BCPS Pager [phone] 05/18/2015 9:38 AM

## 2015-05-18 NOTE — Telephone Encounter (Signed)
Spoke with pts daughter and let her know that the on call cardiologist would see pt at [facility] long.

## 2015-05-18 NOTE — Progress Notes (Addendum)
[facility] PROGRESS NOTE  James [name] EZM:[id number] DOB: 10/05/1928 [name]: 05/15/2015 PCP: [name], MD  Assessment/Plan: Sepsis secondary to possible aspiration pneumonia/HCAP-may be strep pneumonia - Patient reportedly had large nonbloody emesis prior to onset of symptoms.  - Serial CXR noted to be unremarkable.  - Blood cultures neg x 2 thus far - Urinary streptococcal antigen is positive.  - Speech therapy has evaluated and recommend regular consistency diet. On heart healthy diet currently - clinically improved with improved leukocytosis - Pt is continued on IV cefepime and vancomycin.   Acute on stage III chronic kidney disease - Baseline creatinine not entirely clear (possibly 1.4-1.7) - Acute renal failure secondary to dehydration and sepsis - Improving  Anion gap metabolic acidosis - Multifactorial secondary to acute on chronic kidney disease and lactic acidosis. AG on admission: 17 and bicarbonate was 15 - Lactate has since normalized.  Chronic anemia - Multifactorial: Chronic disease, chronic kidney disease, cancer chemotherapy and chronic GI blood loss - Baseline hemoglobin may be around 8 - No overt bleeding reported. - Hemoglobin currently stable at around 8 - Cont to follow hgb and plan to transfuse if hemoglobin less than 7  Acute respiratory failure with hypoxia - Most likely related to aspiration following episode of large emesis - Low index of suspicion for pulmonary embolism even though it cannot be completely ruled out - Low index of suspicion for PE. Even if confirmed, pt is not candidate for anticoagulation secondary to chronic GI bleed and transfusion-dependent anemia.  Chronic GI bleed/GERD - PPI  Chronic diastolic CHF - Clinically slightly dehydrated - Monitor closely while being gently hydrated area  Chronic prednisone use - Continue IV hydrocortisone for now  Marked leukocytosis - Suspect secondary to sepsis - Has not been  on growth stimulating factors per oncology - Improving.  Hypothyroid - Continue on Synthroid - Will follow up on free T4  CAD status post DES - Asymptomatic  History of left lower extremity DVT/portal vein thrombosis - Not on anticoagulants PTA secondary to chronic GI bleed/anemia  Duodenal cancer with liver metastatic disease -Status post 2 cycles of FOLFOX and was due for the third cycle 5/24 but overall performance status has significantly declined. Oncology input appreciated and may not be a candidate for further treatments. - Discussed case with Oncologist, Dr. Benay Spice, who continues to follow  Failure to thrive - Multifactorial  History of ITP - Platelet counts currently normal  Acute urinary retention - As per patient, initially had in and out but continued to have difficulty urinating and hence Foley catheter was placed.  Elevated troponin - No reported chest pain - Suspect from demand ischemia related to hypotension, sepsis, acute on chronic kidney disease  Hypotension - Noted overnight - Responds well to IVF hydration  Bradycardia - Asymptomatic - Not on beta blockers - Daughter called Cardiology office to attempt to obtain an in-house consult herself - Had already consulted Cardiology. Recs to f/u on T4, otherwise Cardiology since signed off  Code Status: Full Family Communication: Pt in room, daughter at bedside Disposition Plan: Pending   Consultants:  Oncology  Cardiology  Procedures:    Antibiotics:  Cefepime 5/23>>>  Vancomycin 5/23>>>   HPI/Subjective: No complaints. States feeling better today  Objective: Filed Vitals:   05/18/15 0900 05/18/15 1000 05/18/15 1100 05/18/15 1200  BP: 129/48 116/42 112/50 124/54  Pulse:      Temp: 97.3 F (36.3 C) 97.9 F (36.6 C) 98.1 F (36.7 C) 98.2 F (36.8 C)  TempSrc:      Resp: 29 23 16 24   Height:      Weight:      SpO2: 97% 98% 97% 99%    Intake/Output Summary (Last 24 hours) at  05/18/15 1434 Last data filed at 05/18/15 0800  Gross per 24 hour  Intake    400 ml  Output    975 ml  Net   -575 ml   Filed Weights   05/15/15 2348 05/16/15 0500 05/17/15 0430  Weight: 89.5 kg (197 lb 5 oz) 90.2 kg (198 lb 13.7 oz) 95.4 kg (210 lb 5.1 oz)    Exam:   General:  Awake, sitting in bed, in nad  Cardiovascular: regular, s1, s2  Respiratory: normal resp effort, no wheezing  Abdomen: soft,nondistended, obese  Musculoskeletal: perfused, no clubbing   Data Reviewed: Basic Metabolic Panel:  Recent Labs Lab 05/15/15 1643 05/16/15 0510 05/17/15 0345 05/18/15 0550  NA 132* 134* 133* 134*  K 4.4 4.6 4.5 4.2  CL 100* 104 109 108  CO2 15* 18* 16* 17*  GLUCOSE 148* 119* 124* 121*  BUN 21* 23* 23* 21*  CREATININE 1.89* 1.59* 1.44* 1.25*  CALCIUM 9.0 7.9* 7.4* 8.0*   Liver Function Tests:  Recent Labs Lab 05/15/15 1643 05/16/15 0510  AST 44* 35  ALT 57 44  ALKPHOS 284* 195*  BILITOT 1.1 0.7  PROT 6.9 5.2*  ALBUMIN 2.6* 2.0*   No results for input(s): LIPASE, AMYLASE in the last 168 hours. No results for input(s): AMMONIA in the last 168 hours. CBC:  Recent Labs Lab 05/15/15 1643 05/16/15 0510 05/17/15 0345 05/18/15 0550  WBC 30.5* 24.7* 18.1* 15.2*  NEUTROABS 28.1* 21.7* 16.1*  --   HGB 11.4* 8.6* 7.7* 8.2*  HCT 36.0* 27.3* 24.1* 26.3*  MCV 88.2 88.1 88.0 88.6  PLT 438* 266 214 206   Cardiac Enzymes:  Recent Labs Lab 05/15/15 1643  TROPONINI 0.06*   BNP (last 3 results)  Recent Labs  05/15/15 1644  BNP 157.2*    ProBNP (last 3 results)  Recent Labs  06/06/14 1244 11/29/14 0944 03/22/15 1513  PROBNP 83.0 114.6 71.0    CBG: No results for input(s): GLUCAP in the last 168 hours.  Recent Results (from the past 240 hour(s))  TECHNOLOGIST REVIEW     Status: None   Collection Time: 05/09/15 10:04 AM  Result Value Ref Range Status   Technologist Review Variant lymphs, few giant plts present  Final  Blood culture (routine  x 2)     Status: None (Preliminary result)   Collection Time: 05/15/15  6:41 PM  Result Value Ref Range Status   Specimen Description BLOOD RIGHT HAND  Final   Special Requests BOTTLES DRAWN AEROBIC AND ANAEROBIC 5 [place]  Final   Culture   Final    GRAM VARIABLE ROD Note: Gram Stain Report Called to,Read Back By and Verified With: MIKE MORGAN @ [id number] ON [id number] BY [facility] Performed at [facility]    Report Status PENDING  Incomplete  Blood culture (routine x 2)     Status: None (Preliminary result)   Collection Time: 05/15/15  6:42 PM  Result Value Ref Range Status   Specimen Description BLOOD PORTA CATH CHEST  Final   Special Requests BOTTLES DRAWN AEROBIC AND ANAEROBIC 5CC EACH  Final   Culture   Final           BLOOD CULTURE RECEIVED NO GROWTH TO DATE CULTURE WILL BE HELD FOR 5 DAYS BEFORE  ISSUING A FINAL NEGATIVE REPORT Performed at [facility]    Report Status PENDING  Incomplete  MRSA PCR Screening     Status: None   Collection Time: 05/15/15 11:48 PM  Result Value Ref Range Status   MRSA by PCR NEGATIVE NEGATIVE Final    Comment:        The GeneXpert MRSA Assay (FDA approved for NASAL specimens only), is one component of a comprehensive MRSA colonization surveillance program. It is not intended to diagnose MRSA infection nor to guide or monitor treatment for MRSA infections.   Culture, sputum-assessment     Status: None   Collection Time: 05/16/15  9:24 AM  Result Value Ref Range Status   Specimen Description SPUTUM  Final   Special Requests NONE  Final   Sputum evaluation   Final    MICROSCOPIC FINDINGS SUGGEST THAT THIS SPECIMEN IS NOT REPRESENTATIVE OF LOWER RESPIRATORY SECRETIONS. PLEASE RECOLLECT. INFORMED SHEPHERD,G. RN @1015  ON 5.24.16 BY MCCOY,N.    Report Status 05/16/2015 FINAL  Final     Studies: No results found.  Scheduled Meds: . hydrocortisone   Rectal TID  . hydrocortisone sod succinate (SOLU-CORTEF) inj  50 mg Intravenous Q8H   . levofloxacin  750 mg Oral Daily  . levothyroxine  25 mcg Oral QAC breakfast  . 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 If 7PM-7AM, please contact night-coverage at [email or web address], password [facility] 05/18/2015, 2:34 PM  LOS: 3 days

## 2015-05-18 NOTE — Telephone Encounter (Signed)
pts daughter requests a member of the heart failure team please see pt he is in icu at [facility] his heart rate is 45.

## 2015-05-18 NOTE — Consult Note (Signed)
Patient ID: James [name] MRN: [id number], DOB/AGE: 79-Sep-1929   Admit date: 05/15/2015   Primary Physician: [name], MD Primary Cardiologist: Dr. Angelena Form  Pt. 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 Cardiology consulted for bradycardia.   Problem List  Past Medical History  Diagnosis Date  . Bradycardia     Hypertensive  . carotid bruit, right   . hypercholesterolemia     Trig 234/293;takes Simcor daily  . Obesity, morbid   . Vasculitis   . 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 Beryle Beams; Dr. Beryle Beams , normal platelets  . Dermatitis     legs  . Bleeding gums   . Chronic low back pain   . Myalgia and myositis   . Unspecified vitamin D deficiency   . Bladder diverticulum   . Primary osteoarthritis of both knees   . Nephrolithiasis   . History of elevated glucose   . Thrombophlebitis     right forearm  . History of BPH   . History of colonic polyps     [name]  . Epididymitis, left     S/P Epidimectomy  . Splenomegaly 05/15/09    abd U/S NML  . Hypertension, benign     takes Micardis daily  . Neuropathy     acute  . Bruises easily   . H/O hiatal hernia   . GERD (gastroesophageal reflux disease)     hx of but doesn't take any meds now  . Urinary frequency   . Urinary urgency   . Nocturia   . Urinary leakage   . Abnormality of gait 03/19/2013  . DVT of lower limb, acute 11/19/2013    Peroneal & distal tibial left 11/01/13  . Polio 1941    "mild case"  . CHF (congestive heart failure)     "low grade" (06/22/2014)  . DVT (deep venous thrombosis) 10/2013    LLE  . Pneumonia, bacterial     RML resolved, spirometry, normal  . Pneumonia 1933  . History of blood transfusion 05/2014  . Anemia 05/2014    "related to Xarelto"  . Iron deficiency anemia 05/2014     "got iron infusion"  . Arthritis     "all over"  . Gout   . Malignant melanoma of ear     right  . Thrombophlebitis of superficial veins of right lower extremity 06/23/2014    Incidental finding doppler 05/30/14  . Shortness of breath   . Esophageal varices without mention of bleeding 08/08/2014  . Portal vein thrombosis   . Duodenal cancer     Past Surgical History  Procedure Laterality Date  . Cystoscopy w/ stone manipulation  1950's  . Fem cutaneous nerve entrapment  1977    due to surgery (mild lat anesth of bilat legs)  . Limited pelvis/hip bone scan  04/99    negative  . Bone scan  04/00  . Cataract extraction w/ intraocular lens  implant, bilateral Bilateral ~ 2004  . Melanoma excision Right 06/04    ear; Wide local excision,,with flap  . Stress cardiolite  05/11/04    mild inf. ischemia, EF 71%  . Shoulder surgery Left 06/17/2005    "tore it all to pieces when I fell; not fractured"  . Bronchoscopy  09/08    RML collapse with chronic pneumonia, bx neg (Dr. Joya Gaskins)  .  Cyst excision Right 03/07/09    middle finger, DIP Joint Mucoid (Dr. Fredna Dow)  . Eyelid & eyebrow lift  1996  . Cyst excision Right 08/15/09    middle finger, DIP Joint Mucoid Cyst (Dr. Fredna Dow)  . Appendectomy  1950  . Excision of mucoid tumor    . Colonoscopy    . Total knee arthroplasty  01/10/2012    Procedure: TOTAL KNEE ARTHROPLASTY;  Surgeon: Augustin Schooling, MD;  Location: [place];  Service: Orthopedics;  Laterality: Left;  . Surgery scrotal / testicular Left ~ 2000    removal of mass, noncancerous  . Cardiac catheterization  05/23/04    mild plague-statin  . Coronary angioplasty with stent placement  06/22/2014    to proximal LAD  . Inguinal hernia repair Bilateral 1959  . Inguinal hernia repair Left 1970's  . Esophagogastroduodenoscopy N/A 08/08/2014    Procedure: ESOPHAGOGASTRODUODENOSCOPY (EGD);  Surgeon: Gatha Mayer, MD;  Location: [facility] ENDOSCOPY;  Service: Endoscopy;  Laterality: N/A;  .  Colonoscopy N/A 08/08/2014    Procedure: COLONOSCOPY;  Surgeon: Gatha Mayer, MD;  Location: [place];  Service: Endoscopy;  Laterality: N/A;  . Flexible sigmoidoscopy N/A 09/21/2014    Procedure: FLEXIBLE SIGMOIDOSCOPY;  Surgeon: Jerene Bears, MD;  Location: [place];  Service: Gastroenterology;  Laterality: N/A;  . Givens capsule study N/A 09/21/2014    Procedure: GIVENS CAPSULE STUDY;  Surgeon: Jerene Bears, MD;  Location: [facility] ENDOSCOPY;  Service: Endoscopy;  Laterality: N/A;  . Left and right heart catheterization with coronary angiogram N/A 06/22/2014    Procedure: LEFT AND RIGHT HEART CATHETERIZATION WITH CORONARY ANGIOGRAM;  Surgeon: Larey Dresser, MD;  Location: [facility] CATH LAB;  Service: Cardiovascular;  Laterality: N/A;  . Percutaneous coronary stent intervention (pci-s)  06/22/2014    Procedure: PERCUTANEOUS CORONARY STENT INTERVENTION (PCI-S);  Surgeon: Larey Dresser, MD;  Location: [facility] CATH LAB;  Service: Cardiovascular;;  . Colonoscopy N/A 01/25/2015    Procedure: COLONOSCOPY;  Surgeon: Gatha Mayer, MD;  Location: [facility] ENDOSCOPY;  Service: Endoscopy;  Laterality: N/A;  . Cystoscopy with biopsy Right 02/03/2015    Procedure: CYSTOSCOPY WITH COLD CUP BLADDER BIOPSY CAUTHERIZAITON OF RIGHT BLADDER CANCER AND SATILITE, TURBT RIGHT BLADDER BASE;  Surgeon: Ailene Rud, MD;  Location: [facility] ORS;  Service: Urology;  Laterality: Right;  . Esophagogastroduodenoscopy N/A 03/03/2015    Procedure: ESOPHAGOGASTRODUODENOSCOPY (EGD);  Surgeon: Jerene Bears, MD;  Location: [facility];  Service: Gastroenterology;  Laterality: N/A;  . Gastrojejunostomy N/A 03/09/2015    Procedure: LAPAROSCOPIC GASTROJEJUNOSTOMY;  Surgeon: Excell Seltzer, MD;  Location: [facility] ORS;  Service: General;  Laterality: N/A;  . Portacath placement Right 04/05/2015    Procedure: INSERTION PORT-A-CATH;  Surgeon: Excell Seltzer, MD;  Location: [place];  Service: General;  Laterality: Right;  .  Esophagogastroduodenoscopy N/A 04/14/2015    Procedure: ESOPHAGOGASTRODUODENOSCOPY (EGD);  Surgeon: Jerene Bears, MD;  Location: [facility] ENDOSCOPY;  Service: Endoscopy;  Laterality: N/A;  bedside case     Allergies  Allergies  Allergen Reactions  . Sulfa Antibiotics Other (See Comments)    Caused patient have ITP  . Aleve [Naproxen Sodium] Other (See Comments)    Causes platlet drop  . Rofecoxib Other (See Comments)    Celebrex  - affected low platelets  . Sulfamethoxazole-Trimethoprim Other (See Comments)     Causes low platelets and bleeding  . Tape Other (See Comments)    Adhesive tape makes skin tear.  . Tramadol Hcl Other (See Comments)  dizziness, drowsiness  . Neomycin-Bacitracin Zn-Polymyx Rash  . Neosporin [Neomycin-Polymyxin-Gramicidin] Rash  . Penicillins Swelling and Rash    Took dose of amoxicillin 2098m 02/02/15 at dentist office without any complications    HPI  The patient is a 81y/o male, followed by Dr. MAundra Dubin His cardiac history is significant for CAD s/p LAD DES 06/2014. He has chronic diastolic HF. Most recent 2D echo 02/2015 demonstrated normal LVEF at 55-60%. He also has a prior h/o DVT, HTN and hypothyroidism on levothyroxin. In March of this year, he was treated for colon CA and underwent duodenal adenocarcinoma resection. He has known metastatic disease to his liver.   [facility] with complaints of vomiting and dyspnea. W/u revealed aspiration PNA vs HCAP with sepsis. He is being treated with antibiotics. Cardiology has been consulted for bradycardia captured on telemetry. Lowest HR has been in the 40s, sinus. No pauses or CHB. He is currently in sinus brady with resting HR in the mid 50s. BP has been soft with periods of hypotension with SBP in the 80s. Most recent SBP was in the 120s. He has remained asymptomatic with the exception of fatigue. No dyspnea, CP, dizziness, syncope/ near syncope. He is currently on no AVN blocking agents. K has  been normal. Most recent TSH was abnormal at 29. He is on 25 mcg of levothyroxine.     Home Medications  Prior to Admission medications   Medication Sig Start Date End Date Taking? Authorizing Provider  benzonatate (TESSALON) 200 MG capsule Take 1 capsule (200 mg total) by mouth 3 (three) times daily as needed. Patient taking differently: Take 200 mg by mouth 3 (three) times daily as needed for cough.  04/24/15  Yes GTonia Ghent MD  Cholecalciferol (VITAMIN D3) 2000 UNITS capsule Take 2,000 Units by mouth every morning.    Yes Historical Provider, MD  dibucaine (NUPERCAINAL) 1 % ointment Apply topically 3 (three) times daily as needed for pain. 04/24/15  Yes GTonia Ghent MD  feeding supplement, ENSURE ENLIVE, (ENSURE ENLIVE) LIQD Take 237 mLs by mouth 3 (three) times daily between meals. 03/16/15  Yes SDebbe Odea MD  finasteride (PROSCAR) 5 MG tablet Take 5 mg by mouth daily.   Yes Historical Provider, MD  furosemide (LASIX) 40 MG tablet Take 0.5 tablets (20 mg total) by mouth daily. 04/17/15  Yes GOswald Hillock MD  hydrocortisone (ANUSOL-HC) 2.5 % rectal cream Apply topically 2 (two) times daily. Patient taking differently: Apply topically 2 (two) times daily as needed for hemorrhoids or itching.  04/17/15  Yes GOswald Hillock MD  levothyroxine (LEVOTHROID) 25 MCG tablet Take 1 tablet (25 mcg total) by mouth daily before breakfast. 05/04/15  Yes GTonia Ghent MD  lidocaine-prilocaine (EMLA) cream Apply 1 application topically as needed. 04/06/15  Yes LOwens Shark NP  LIVALO 4 MG TABS TAKE 1 TABLET AT BEDTIME 10/03/14  Yes DJolaine Artist MD  loperamide (IMODIUM A-D) 2 MG tablet Take 1 tablet (2 mg total) by mouth 2 (two) times daily as needed for diarrhea or loose stools. 04/24/15  Yes GTonia Ghent MD  Multiple Vitamin (MULTIVITAMIN WITH MINERALS) TABS tablet Take 1 tablet by mouth daily.   Yes Historical Provider, MD  OVER THE COUNTER MEDICATION Place 2 drops into both eyes 4 (four)  times daily. Wash type eye drop-   Yes Historical Provider, MD  oxyCODONE-acetaminophen (ROXICET) 5-325 MG per tablet Take 1 tablet by mouth every 4 (four) hours  as needed for severe pain. 03/21/15  Yes Ladell Pier, MD  pantoprazole (PROTONIX) 40 MG tablet TAKE 1 TABLET DAILY Patient taking differently: TAKE 1 TABLET TWICE DAILY 05/08/15  Yes Larey Dresser, MD  POLY-IRON 150 FORTE 381-82-9 MG-MCG-MG CAPS TAKE 1 CAPSULE TWICE A DAY 05/08/15  Yes Larey Dresser, MD  pramoxine (PROCTOFOAM) 1 % foam Place 1 application rectally 3 (three) times daily as needed for itching. 04/24/15  Yes Tonia Ghent, MD  predniSONE (DELTASONE) 5 MG tablet Take 1 tablet (5 mg total) by mouth daily with breakfast. 03/31/15  Yes Owens Shark, NP  Probiotic Product (PROBIOTIC & ACIDOPHILUS EX ST PO) Take 1 tablet by mouth every morning.    Yes Historical Provider, MD  prochlorperazine (COMPAZINE) 5 MG tablet Take 1 tablet (5 mg total) by mouth every 6 (six) hours as needed for nausea or vomiting. 03/21/15  Yes Ladell Pier, MD  Psyllium (METAMUCIL PO) Take 20 mLs by mouth daily as needed (constipation).    Yes Historical Provider, MD  spironolactone (ALDACTONE) 25 MG tablet Take 0.5 tablets (12.5 mg total) by mouth daily. 04/17/15  Yes Oswald Hillock, MD  KLOR-CON M20 20 MEQ tablet TAKE 1 TABLET DAILY Patient not taking: Reported on 05/15/2015 05/08/15   Larey Dresser, MD  nitroGLYCERIN (NITROSTAT) 0.4 MG SL tablet Place 1 tablet (0.4 mg total) under the tongue every 5 (five) minutes as needed. For chest pain Patient not taking: Reported on 05/15/2015 07/07/14   Larey Dresser, MD    Family History  Family History  Problem Relation Age of Onset  . Thyroid cancer Mother   . Diabetes Father   . Heart disease Father   . Stroke Brother     cerebral hemm  . Colon cancer Brother   . Anesthesia problems Neg Hx   . Hypotension Neg Hx   . Malignant hyperthermia Neg Hx   . Pseudochol deficiency Neg Hx   . Prostate  cancer Neg Hx   . Heart attack Father     Social History  History   Social History  . Marital Status: Widowed    Spouse Name: N/A  . Number of Children: 2  . Years of Education: college   Occupational History  . Retired     1996 [employer]   Social History Main Topics  . Smoking status: Former Smoker -- 2.00 packs/day for 20 years    Types: Cigarettes, Cigars    Quit date: 09/27/1967  . Smokeless tobacco: Never Used  . Alcohol Use: No  . Drug Use: No  . Sexual Activity: Not on file   Other Topics Concern  . Not on file   Social History Narrative   Widowed 2013 after 63+ years of marriage, Wife had CHF and multiple myeloma   [name], daughter in Sports coach, lives with patient   Patient does not get regular exercise   No caffeine   Retired from [employer] ([employer]), retired from [place]:  No chills, fever, night sweats or weight changes.  Cardiovascular:  No chest pain, dyspnea on exertion, edema, orthopnea, palpitations, paroxysmal nocturnal dyspnea. Dermatological: No rash, lesions/masses Respiratory: No cough, dyspnea Urologic: No hematuria, dysuria Abdominal:   No nausea, vomiting, diarrhea, bright red blood per rectum, melena, or hematemesis Neurologic:  No visual changes, wkns, changes in mental status. All other systems reviewed and are otherwise negative except as noted  above.  Physical Exam  Blood pressure 129/48, pulse 42, temperature 97.3 F (36.3 C), temperature source Core (Comment), resp. rate 29, height 6' (1.829 m), weight 210 lb 5.1 oz (95.4 kg), SpO2 97 %.  General: Pleasant, NAD Psych: Normal affect. Neuro: Alert and oriented X 3. Moves all extremities spontaneously. HEENT: Normal  Neck: Supple without bruits or JVD. Lungs:  Resp regular and unlabored, CTA. Heart: Regular rhythm, brady rate no s3, s4, or murmurs. Abdomen: Soft, non-tender, non-distended, BS + x 4.  Extremities: No clubbing,  cyanosis or edema. DP/PT/Radials 2+ and equal bilaterally.  Labs  Troponin (Point of Care Test) No results for input(s): TROPIPOC in the last 72 hours.  Recent Labs  05/15/15 1643  TROPONINI 0.06*   Lab Results  Component Value Date   WBC 15.2* 05/18/2015   HGB 8.2* 05/18/2015   HCT 26.3* 05/18/2015   MCV 88.6 05/18/2015   PLT 206 05/18/2015    Recent Labs Lab 05/16/15 0510  05/18/15 0550  NA 134*  < > 134*  K 4.6  < > 4.2  CL 104  < > 108  CO2 18*  < > 17*  BUN 23*  < > 21*  CREATININE 1.59*  < > 1.25*  CALCIUM 7.9*  < > 8.0*  PROT 5.2*  --   --   BILITOT 0.7  --   --   ALKPHOS 195*  --   --   ALT 44  --   --   AST 35  --   --   GLUCOSE 119*  < > 121*  < > = values in this interval not displayed. Lab Results  Component Value Date   CHOL 144 10/24/2014   HDL 28.50* 10/24/2014   LDLCALC 54 10/22/2013   TRIG 206.0* 10/24/2014   Lab Results  Component Value Date   DDIMER 6.08* 09/05/2014     Radiology/Studies  Dg Chest 2 View  05/16/2015   CLINICAL DATA:  Shortness of breath with fever and chills. Metastatic duodenum cancer  EXAM: CHEST  2 VIEW  COMPARISON:  May 15, 2015  FINDINGS: Port-A-Cath tip is in the superior vena cava. No pneumothorax. There is no edema or consolidation. There is slight bibasilar lung scarring. Heart is upper limits normal in size with pulmonary vascularity within normal limits. No adenopathy. No bone lesions.  IMPRESSION: Slight bibasilar lung scarring.  No edema or consolidation.   Electronically Signed   By: Lowella Grip III M.D.   On: 05/16/2015 11:46   Dg Chest 2 View  05/15/2015   CLINICAL DATA:  Pt from home, complains of emesis and dizziness, denies weakness. Hx colon cancer.  EXAM: CHEST  2 VIEW  COMPARISON:  Cough 04/12/2015  FINDINGS: Cardiac silhouette mildly enlarged. No mediastinal or hilar masses or evidence of adenopathy.  Minor reticular scarring in the antral lateral lung bases. This is unchanged. No lung  consolidation or edema. No pleural effusion or pneumothorax.  Right anterior chest wall Port-A-Cath is stable.  Bony thorax is intact.  IMPRESSION: No acute cardiopulmonary disease.   Electronically Signed   By: Lajean Manes M.D.   On: 05/15/2015 17:08    ECG  Sinus brady. 55 bpm  ASSESSMENT AND PLAN Principal Problem:   Aspiration pneumonia Active Problems:   HYPERTENSION, BENIGN   Chronic diastolic CHF (congestive heart failure)   CAD S/P LAD DES after FFR 06/22/14   Portal hypertension   Duodenal adenocarcinoma s/p resection 03/09/2015   Hypothyroidism   History  of DVT of lower extremity   Hemorrhoids   Sepsis   Shortness of breath   SOB (shortness of breath)   Sepsis due to pneumonia   1. Asymptomatic Bradycardia: HR currently in the mid 76s. Occasional drops in the 40s. No pauses or blocks. Other than fatigue, he is asymptomatic. He has multiple commodities including metastatic colon cancer. Would not recommend PPM. Recommend avoidance of AVN blocking agents. He has hypothyroidism and is on levothyroxine. Most recent TSH was elevated at 29. Consider checking Free T4 and adjust thyroid replacement if necessary, as hypothyroidism can also be contributing to his bradycardia. Continue on telemetry.   Signed, Lyda Jester, PA-C 05/18/2015, 10:50 AM   I have personally seen and examined this patient with Lyda Jester, PA-C. I agree with the assessment and plan as outlined above. Elderly male with metastatic cancer with sinus bradycardia. No interventions are necessary. He is asymptomatic. Agree with further thyroid workup. We will sign off. Please call with questions.   MCALHANY,CHRISTOPHER 05/18/2015 11:29 AM

## 2015-05-19 LAB — CBC
HCT: 26.6 % — ABNORMAL LOW (ref 39.0–52.0)
Hemoglobin: 8.5 g/dL — ABNORMAL LOW (ref 13.0–17.0)
MCH: 28.1 pg (ref 26.0–34.0)
MCHC: 32 g/dL (ref 30.0–36.0)
MCV: 88.1 fL (ref 78.0–100.0)
Platelets: 195 10*3/uL (ref 150–400)
RBC: 3.02 MIL/uL — ABNORMAL LOW (ref 4.22–5.81)
RDW: 19.2 % — ABNORMAL HIGH (ref 11.5–15.5)
WBC: 17.3 10*3/uL — AB (ref 4.0–10.5)

## 2015-05-19 MED ORDER — MAGNESIUM CITRATE PO SOLN: 1.0000 | Freq: Once | ORAL | Status: DC

## 2015-05-19 MED ORDER — PREDNISONE 20 MG PO TABS
60.0000 mg | ORAL_TABLET | Freq: Every day | ORAL | Status: DC
  Administered 2015-05-20: 60 mg via ORAL
  Filled 2015-05-19: qty 3

## 2015-05-19 MED ORDER — PREDNISONE 5 MG PO TABS: 5.0000 mg | ORAL_TABLET | Freq: Every day | ORAL | Status: DC

## 2015-05-19 MED ORDER — LEVOTHYROXINE SODIUM 50 MCG PO TABS: 50.0000 ug | ORAL_TABLET | Freq: Every day | ORAL | Status: AC

## 2015-05-19 MED ORDER — HYDROCORTISONE SOD SUCCINATE 100 MG PF FOR IT USE: 50.0000 mg | Freq: Once | INTRAMUSCULAR | Status: DC

## 2015-05-19 MED ORDER — LEVOFLOXACIN 750 MG PO TABS: 750.0000 mg | ORAL_TABLET | Freq: Every day | ORAL | Status: DC

## 2015-05-19 MED ORDER — BISACODYL 10 MG RE SUPP
10.0000 mg | Freq: Once | RECTAL | Status: DC
Start: 2015-05-19 — End: 2015-05-20

## 2015-05-19 MED ORDER — SPIRONOLACTONE 25 MG PO TABS
12.5000 mg | ORAL_TABLET | Freq: Every day | ORAL | Status: DC
  Administered 2015-05-19 – 2015-05-20 (×2): 12.5 mg via ORAL
  Filled 2015-05-19 (×2): qty 1

## 2015-05-19 MED ORDER — SIMETHICONE 80 MG PO CHEW
80.0000 mg | CHEWABLE_TABLET | Freq: Four times a day (QID) | ORAL | Status: DC | PRN
  Administered 2015-05-19: 80 mg via ORAL
  Filled 2015-05-19 (×2): qty 1

## 2015-05-19 MED ORDER — HYDROCORTISONE NA SUCCINATE PF 100 MG IJ SOLR
50.0000 mg | Freq: Once | INTRAMUSCULAR | Status: AC
  Administered 2015-05-19: 50 mg via INTRAVENOUS
  Filled 2015-05-19: qty 2

## 2015-05-19 MED ORDER — FUROSEMIDE 20 MG PO TABS
20.0000 mg | ORAL_TABLET | Freq: Every day | ORAL | Status: DC
  Administered 2015-05-19 – 2015-05-20 (×2): 20 mg via ORAL
  Filled 2015-05-19 (×2): qty 1

## 2015-05-19 MED ORDER — LEVOTHYROXINE SODIUM 50 MCG PO TABS
50.0000 ug | ORAL_TABLET | Freq: Every day | ORAL | Status: DC
  Administered 2015-05-19 – 2015-05-20 (×2): 50 ug via ORAL
  Filled 2015-05-19 (×2): qty 1

## 2015-05-19 NOTE — Care Management Note (Signed)
Case Management Note  Patient Details  Name: James [name] MRN: [id number] Date of Birth: [birth year]  Subjective/Objective:       81 yo male admitted with aspiration pna             Action/Plan: Patient stated that he has had [place] services with [facility] in the past and that he would like to use [facility] services with [facility] again. Communicated referral to [facility] rep, Kristen. Patient lives with daughter [name] and family will transport patient home at time of discharge. Please contact CM with any other needs.  Expected Discharge Date:   (unknown)               Expected Discharge Plan:  [place]  In-House Referral:  NA  Discharge planning Services  CM Consult  Post Acute Care Choice:  Home Health Choice offered to:  Patient  DME Arranged:    DME Agency:     HH Arranged:  PT, Nurse's Aide [place] Agency:  [facility]  Status of Service:  Completed, signed off  Medicare Important Message Given:  Yes Date Medicare IM Given:  05/19/15 Medicare IM give by:  Leanne Chang Date Additional Medicare IM Given:    Additional Medicare Important Message give by:     If discussed at [place] of Stay Meetings, dates discussed:    Additional Comments:  Scot Dock, RN 05/19/2015, 11:58 AM

## 2015-05-19 NOTE — Progress Notes (Signed)
Alerted MD to " + streptococus species" in blood cultures--per [facility] labs.

## 2015-05-19 NOTE — Progress Notes (Signed)
Physical Therapy Treatment Patient Details Name: James [name] MRN: [id number] DOB: 26-Jun-1928 Today's Date: 05/19/2015    History of Present Illness Third admission this year for this 81 yr old maile.  Pt admitted with vomitting and dx with aspiration pna    PT Comments    Pt motivated and progressing well with mobility but ltd this date by onset bowel incontinence.  Follow Up Recommendations  Home health PT;No PT follow up     Equipment Recommendations  None recommended by PT    Recommendations for Other Services OT consult     Precautions / Restrictions Precautions Precautions: Fall Restrictions Weight Bearing Restrictions: No    Mobility  Bed Mobility Overal bed mobility: Needs Assistance Bed Mobility: Supine to Sit     Supine to sit: Min guard     General bed mobility comments: cues for sequence.  Pt utilized rail to self assist - has rail on bed at home  Transfers Overall transfer level: Needs assistance Equipment used: Rolling walker (2 wheeled) Transfers: Sit to/from Stand Sit to Stand: Min guard         General transfer comment: cues for transition position and use of UEs to self assist; Pt stood from EOB, BSC and chair  Ambulation/Gait Ambulation/Gait assistance: Min guard Ambulation Distance (Feet): 130 Feet (and 2x 20' to/from bathroom) Assistive device: Rolling walker (2 wheeled) Gait Pattern/deviations: Step-through pattern;Decreased step length - right;Decreased step length - left;Shuffle;Trunk flexed Gait velocity: cues to slow pace 2* SOB   General Gait Details: cues for posture, pace and position from RW.  Ltd by onset incontinence of bowel   Stairs            Wheelchair Mobility    Modified Rankin (Stroke Patients Only)       Balance                                    Cognition Arousal/Alertness: Awake/alert Behavior During Therapy: WFL for tasks assessed/performed Overall Cognitive Status: Within  Functional Limits for tasks assessed                      Exercises General Exercises - Lower Extremity Ankle Circles/Pumps: AROM;Both;Supine;20 reps    General Comments        Pertinent Vitals/Pain Pain Assessment: 0-10 Pain Score: 3  Pain Location: "backend" Pain Descriptors / Indicators: Tender Pain Intervention(s): Limited activity within patient's tolerance;Monitored during session    Home Living                      Prior Function            PT Goals (current goals can now be found in the care plan section) Acute Rehab PT Goals Patient Stated Goal: Resume previous lifestyle and regain as much IND as possible PT Goal Formulation: With patient Time For Goal Achievement: 06/01/15 Potential to Achieve Goals: Good    Frequency  Min 3X/week    PT Plan Current plan remains appropriate    Co-evaluation             End of Session Equipment Utilized During Treatment: Gait belt Activity Tolerance: Patient tolerated treatment well Patient left: in chair;with call bell/phone within reach;with family/visitor present     Time: 7829-5621 PT Time Calculation (min) (ACUTE ONLY): 34 min  Charges:  $Gait Training: 8-22 mins $Therapeutic Activity: 8-22 mins  G Codes:      Oaklyn Mans 06/12/15, 12:52 PM

## 2015-05-19 NOTE — Evaluation (Addendum)
Occupational Therapy Evaluation Patient Details Name: James [name] MRN: [id number] DOB: 1928/08/24 Today's Date: 05/19/2015    History of Present Illness Third admission this year for this 81 yr old maile.  Pt admitted with vomitting and dx with sepsis secondary to aspiration pna. Past medical history of duodenal carcinoma with liver metastasis and admitted to peritoneal lymph nodes status post 2 cycles of FOLFOX and palliative gastrojejunostomy, chronic diastolic heart failure, portal vein thrombosis, coronary artery disease, left leg DVT,    Clinical Impression   Pt admitted with above. He demonstrates the below listed deficits and will benefit from continued OT to maximize safety and independence with BADLs.  Pt presents to OT with generalized weakness.   He requires min - mod A for ADLs and multiple rest breaks due to fatigue.  Dyspne 3/4.  Pt with complaint of feeling dizzy and faint with standing.  BP seated 121/54.  Unable to get BP standing with 2 attempts as the dynamap cycled itself twice.  BP immediately upon sitting 137/61.  02 sat 95% on RA, HR 72 at rest, up to 94 with activity.  He will need 24 hour assist at discharge.       Follow Up Recommendations  Home health OT;Supervision/Assistance - 24 hour    Equipment Recommendations  3 in 1 bedside comode    Recommendations for Other Services       Precautions / Restrictions Precautions Precautions: Fall      Mobility Bed Mobility                  Transfers Overall transfer level: Needs assistance   Transfers: Sit to/from Stand;Stand Pivot Transfers Sit to Stand: Min guard;Min assist Stand pivot transfers: Min assist       General transfer comment: min cues for hand placement.  Min guard iniitally, min A as he fatigues     Balance Overall balance assessment: Needs assistance Sitting-balance support: Feet supported Sitting balance-Leahy Scale: Good     Standing balance support: Bilateral upper  extremity supported Standing balance-Leahy Scale: Poor Standing balance comment: requires UE support                             ADL Overall ADL's : Needs assistance/impaired Eating/Feeding: Independent   Grooming: Wash/dry hands;Wash/dry face;Oral care;Brushing hair;Set up;Sitting   Upper Body Bathing: Minimal assitance;Sitting   Lower Body Bathing: Minimal assistance;Sit to/from stand Lower Body Bathing Details (indicate cue type and reason): Pt able to don/doff socks and slippers but requires multiple rest breaks  Upper Body Dressing : Minimal assistance;Sitting   Lower Body Dressing: Minimal assistance;Moderate assistance Lower Body Dressing Details (indicate cue type and reason): Pt able to don/doff socks and slippers but requires multiple rest breaks.  Progresses to mod A as he fatigues  Toilet Transfer: Minimal assistance;Ambulation;Comfort height toilet;+2 for safety/equipment;RW;BSC Toilet Transfer Details (indicate cue type and reason): requires second person for safety.  Pt unable to tolerate ambulation back to room so recliner pulled just outside the door  Toileting- Clothing Manipulation and Hygiene: Maximal assistance;Sit to/from stand Toileting - Clothing Manipulation Details (indicate cue type and reason): due to fatigue      Functional mobility during ADLs: Minimal assistance;+2 for safety/equipment;Rolling walker General ADL Comments: Discussed placing chairs strategically in home so he has freqent opportunities to sit and rest      [place]  Pertinent Vitals/Pain Pain Assessment: No/denies pain Pain Score: 3  Pain Location: buttoks Pain Descriptors / Indicators: Burning;Discomfort;Grimacing;Guarding Pain Intervention(s): Monitored during session;Repositioned     Hand Dominance Left   Extremity/Trunk Assessment Upper Extremity Assessment Upper Extremity Assessment: Generalized weakness   Lower Extremity  Assessment Lower Extremity Assessment: Defer to PT evaluation   Cervical / Trunk Assessment Cervical / Trunk Assessment: Normal   Communication Communication Communication: No difficulties   Cognition Arousal/Alertness: Awake/alert Behavior During Therapy: Anxious Overall Cognitive Status: Within Functional Limits for tasks assessed                     General Comments       Exercises       Shoulder Instructions      Home Living Family/patient expects to be discharged to:: Private residence Living Arrangements: Other relatives Available Help at Discharge: Family Type of Home: House Home Access: Level entry     Home Layout: Able to live on main level with bedroom/bathroom;Two level     Bathroom Shower/Tub: Occupational psychologist: Handicapped height Bathroom Accessibility: Yes How Accessible: Accessible via wheelchair;Accessible via walker [place] - single point;Walker - 2 wheels;Shower seat - built in;Grab bars - toilet;Grab bars - tub/shower   Additional Comments: Pt is assisted by dtr and multiple sisters      Prior Functioning/Environment Level of Independence: Independent;Independent with assistive device(s)        Comments: Pt states used cane more than RW    OT Diagnosis: Generalized weakness;Acute pain   OT Problem List: Decreased strength;Decreased activity tolerance;Impaired balance (sitting and/or standing);Decreased safety awareness;Decreased knowledge of use of DME or AE;Pain   OT Treatment/Interventions: Self-care/ADL training;Therapeutic exercise;DME and/or AE instruction;Therapeutic activities;Patient/family education;Balance training;Energy conservation    OT Goals(Current goals can be found in the care plan section) Acute Rehab OT Goals Patient Stated Goal: to regain strength  OT Goal Formulation: With patient Time For Goal Achievement: 06/02/15 Potential to Achieve Goals: Good ADL Goals Pt Will Perform  Grooming: with min guard assist;standing Pt Will Perform Upper Body Bathing: with set-up;sitting Pt Will Perform Lower Body Bathing: with min guard assist;sit to/from stand Pt Will Perform Upper Body Dressing: with supervision;sitting Pt Will Perform Lower Body Dressing: with min guard assist;sit to/from stand Pt Will Transfer to Toilet: with min guard assist;ambulating;regular height toilet;bedside commode;grab bars Pt Will Perform Toileting - Clothing Manipulation and hygiene: with min guard assist;sit to/from stand Additional ADL Goal #1: Pt will be able to particiapte in 25 mins therapeutic activity with no more than 2 rest breaks   OT Frequency: Min 2X/week   Barriers to D/C:            Co-evaluation              End of Session Equipment Utilized During Treatment: Rolling walker Nurse Communication: Mobility status  Activity Tolerance: Patient limited by fatigue Patient left: in chair;with call bell/phone within reach;with nursing/sitter in room;with family/visitor present   Time: 0867-6195 OT Time Calculation (min): 73 min Charges:  OT General Charges $OT Visit: 1 Procedure OT Evaluation $Initial OT Evaluation Tier I: 1 Procedure OT Treatments $Self Care/Home Management : 53-67 mins G-Codes:    Storm Dulski M 01-Jun-2015, 4:59 PM

## 2015-05-19 NOTE — Progress Notes (Signed)
For discharge today after voids.  If not, then will be transferred to regular unit.  05/19/15 4:44pm [name], RN,BSN,CCDS

## 2015-05-19 NOTE — Progress Notes (Signed)
[facility] PROGRESS NOTE  James [name] BZJ:[id number] DOB: 1928-06-08 [name]: 05/15/2015 PCP: [name], MD  Assessment/Plan: Sepsis secondary to possible aspiration pneumonia/HCAP with strep pneumonia and strep bacteremia - Patient reportedly had large nonbloody emesis prior to onset of symptoms.  - Serial CXR noted to be unremarkable.  - Blood cultures now pos 1/2 for strep species - Urinary streptococcal antigen is positive.  - Speech therapy has evaluated and recommend regular consistency diet. On heart healthy diet currently - clinically improved with improved leukocytosis - Pt is continued on IV cefepime and vancomycin and had been transitioned to levaquin to complete course  Acute on stage III chronic kidney disease - Baseline creatinine not entirely clear (possibly 1.4-1.7) - Acute renal failure secondary to dehydration and sepsis - Improving  Anion gap metabolic acidosis - Multifactorial secondary to acute on chronic kidney disease and lactic acidosis. AG on admission: 17 and bicarbonate was 15 - Lactate has since normalized.  Chronic anemia - Multifactorial: Chronic disease, chronic kidney disease, cancer chemotherapy and chronic GI blood loss - Baseline hemoglobin may be around 8 - No overt bleeding reported. - Hemoglobin currently stable at around 8 - Cont to follow hgb and plan to transfuse if hemoglobin less than 7  Acute respiratory failure with hypoxia - Most likely related to aspiration following episode of large emesis - Low index of suspicion for pulmonary embolism even though it cannot be completely ruled out - Low index of suspicion for PE. Even if confirmed, pt is not candidate for anticoagulation secondary to chronic GI bleed and transfusion-dependent anemia.  Chronic GI bleed/GERD - PPI  Chronic diastolic CHF - Stable - Will resume diuretics  Chronic prednisone use - Continued IV hydrocortisone for now - Wean steroids as  tolerated  Marked leukocytosis - Suspect secondary to sepsis - Has not been on growth stimulating factors per oncology - Improving.  Hypothyroid - Continue on Synthroid - Free T4 is borderline low at 0.44 - Have increased dose of synthroid to 92mcg  CAD status post DES - Asymptomatic  History of left lower extremity DVT/portal vein thrombosis - Not on anticoagulants PTA secondary to chronic GI bleed/anemia  Duodenal cancer with liver metastatic disease -Status post 2 cycles of FOLFOX and was due for the third cycle 5/24 but overall performance status has significantly declined. Oncology input appreciated and may not be a candidate for further treatments. - Had previously discussed case with Oncologist, Dr. Benay Spice, who continues to follow - Pt has follow up appt on 5/31  Failure to thrive - Multifactorial  History of ITP - Platelet counts currently normal  Acute urinary retention - As per patient, initially had in and out but continued to have difficulty urinating and hence Foley catheter was placed.  Elevated troponin - No reported chest pain - Suspect from demand ischemia related to hypotension, sepsis, acute on chronic kidney disease  Hypotension - Noted overnight - Responds well to IVF hydration  Bradycardia - Asymptomatic - Not on beta blockers - Daughter had earlier called Cardiology office to attempt to obtain an in-house consult herself - Had consulted Cardiology. Recs to f/u on T4 as per above, otherwise Cardiology since signed off  Code Status: Full Family Communication: Pt in room, daughter at bedside Disposition Plan: Pending   Consultants:  Oncology  Cardiology  Procedures:    Antibiotics:  Cefepime 5/23>>>  Vancomycin 5/23>>>   HPI/Subjective: No complaints today  Objective: Filed Vitals:   05/19/15 0700 05/19/15 0900 05/19/15 1500 05/19/15 1612  BP: 119/56  113/67 121/54  Pulse:      Temp: 97.5 F (36.4 C)   98.1 F (36.7 C)   TempSrc:    Oral  Resp: 15  23 24   Height:      Weight:  97.4 kg (214 lb 11.7 oz)    SpO2: 96%  98% 98%    Intake/Output Summary (Last 24 hours) at 05/19/15 1811 Last data filed at 05/19/15 1100  Gross per 24 hour  Intake    180 ml  Output   1950 ml  Net  -1770 ml   Filed Weights   05/16/15 0500 05/17/15 0430 05/19/15 0900  Weight: 90.2 kg (198 lb 13.7 oz) 95.4 kg (210 lb 5.1 oz) 97.4 kg (214 lb 11.7 oz)    Exam:   General:  Ambulatory in hallway with PT, in nad  Cardiovascular: regular, s1, s2  Respiratory: normal resp effort, no wheezing  Abdomen: soft,nondistended, obese  Musculoskeletal: perfused, no clubbing   Data Reviewed: Basic Metabolic Panel:  Recent Labs Lab 05/15/15 1643 05/16/15 0510 05/17/15 0345 05/18/15 0550  NA 132* 134* 133* 134*  K 4.4 4.6 4.5 4.2  CL 100* 104 109 108  CO2 15* 18* 16* 17*  GLUCOSE 148* 119* 124* 121*  BUN 21* 23* 23* 21*  CREATININE 1.89* 1.59* 1.44* 1.25*  CALCIUM 9.0 7.9* 7.4* 8.0*   Liver Function Tests:  Recent Labs Lab 05/15/15 1643 05/16/15 0510  AST 44* 35  ALT 57 44  ALKPHOS 284* 195*  BILITOT 1.1 0.7  PROT 6.9 5.2*  ALBUMIN 2.6* 2.0*   No results for input(s): LIPASE, AMYLASE in the last 168 hours. No results for input(s): AMMONIA in the last 168 hours. CBC:  Recent Labs Lab 05/15/15 1643 05/16/15 0510 05/17/15 0345 05/18/15 0550 05/19/15 0547  WBC 30.5* 24.7* 18.1* 15.2* 17.3*  NEUTROABS 28.1* 21.7* 16.1*  --   --   HGB 11.4* 8.6* 7.7* 8.2* 8.5*  HCT 36.0* 27.3* 24.1* 26.3* 26.6*  MCV 88.2 88.1 88.0 88.6 88.1  PLT 438* 266 214 206 195   Cardiac Enzymes:  Recent Labs Lab 05/15/15 1643  TROPONINI 0.06*   BNP (last 3 results)  Recent Labs  05/15/15 1644  BNP 157.2*    ProBNP (last 3 results)  Recent Labs  06/06/14 1244 11/29/14 0944 03/22/15 1513  PROBNP 83.0 114.6 71.0    CBG: No results for input(s): GLUCAP in the last 168 hours.  Recent Results (from the past  240 hour(s))  Blood culture (routine x 2)     Status: None (Preliminary result)   Collection Time: 05/15/15  6:41 PM  Result Value Ref Range Status   Specimen Description BLOOD RIGHT HAND  Final   Special Requests BOTTLES DRAWN AEROBIC AND ANAEROBIC [address] [place]  Final   Culture   Final    STREPTOCOCCUS SPECIES Note: PREVIOUSLY REPORTED AS GRAM VARIABLE RODS CORRECTED RESULTS CALLED TO: [facility] WILLARD RN 05/19/15 AT 61 BY MORAC Gram Stain Report Called to,Read Back By and Verified With: [facility] MORGAN @ 2094 ON [id number] BY [facility] Performed at [employer]    Report Status PENDING  Incomplete  Blood culture (routine x 2)     Status: None (Preliminary result)   Collection Time: 05/15/15  6:42 PM  Result Value Ref Range Status   Specimen Description BLOOD PORTA CATH CHEST  Final   Special Requests BOTTLES DRAWN AEROBIC AND ANAEROBIC [facility] EACH  Final   Culture   Final  BLOOD CULTURE RECEIVED NO GROWTH TO DATE CULTURE WILL BE HELD FOR 5 DAYS BEFORE ISSUING A FINAL NEGATIVE REPORT Performed at [employer]    Report Status PENDING  Incomplete  MRSA PCR Screening     Status: None   Collection Time: 05/15/15 11:48 PM  Result Value Ref Range Status   MRSA by PCR NEGATIVE NEGATIVE Final    Comment:        The GeneXpert MRSA Assay (FDA approved for NASAL specimens only), is one component of a comprehensive MRSA colonization surveillance program. It is not intended to diagnose MRSA infection nor to guide or monitor treatment for MRSA infections.   Culture, sputum-assessment     Status: None   Collection Time: 05/16/15  9:24 AM  Result Value Ref Range Status   Specimen Description SPUTUM  Final   Special Requests NONE  Final   Sputum evaluation   Final    MICROSCOPIC FINDINGS SUGGEST THAT THIS SPECIMEN IS NOT REPRESENTATIVE OF LOWER RESPIRATORY SECRETIONS. PLEASE RECOLLECT. INFORMED SHEPHERD,G. RN @1015  ON 5.24.16 BY MCCOY,N.    Report Status 05/16/2015 FINAL  Final      Studies: No results found.  Scheduled Meds: . bisacodyl  10 mg Rectal Once  . furosemide  20 mg Oral Daily  . hydrocortisone   Rectal TID  . levofloxacin  750 mg Oral Daily  . levothyroxine  50 mcg Oral QAC breakfast  . magnesium citrate  1 Bottle Oral Once  . pantoprazole (PROTONIX) IV  40 mg Intravenous Q12H  . [START ON 05/20/2015] predniSONE  60 mg Oral Q breakfast  . spironolactone  12.5 mg Oral Daily   Continuous Infusions:   Principal Problem:   Aspiration pneumonia Active Problems:   HYPERTENSION, BENIGN   Chronic diastolic CHF (congestive heart failure)   CAD S/P LAD DES after FFR 06/22/14   Portal hypertension   Duodenal adenocarcinoma s/p resection 03/09/2015   Hypothyroidism   History of DVT of lower extremity   Hemorrhoids   Sepsis   Shortness of breath   SOB (shortness of breath)   Sepsis due to pneumonia   Pressure ulcer   Echo [place], [place] [facility] Pager [phone]. If 7PM-7AM, please contact night-coverage at [email or web address], password [facility] 05/19/2015, 6:11 PM  LOS: 4 days

## 2015-05-19 NOTE — Consult Note (Addendum)
   [facility] Inpatient Consult   05/19/2015  ELIM ECONOMOU 1928-07-03 [id number]   Patient evaluated for James [name] for 4 admissions in the past 6 months. Came by to see patient to discuss and offer James [name] with his daughter at bedside. He states he is not sure if [place] [facility] [name] are needed as he has [place] and he follows up closely with his Oncologist. James [name] states he does not want to have a duplication of [name]. Writer attempted to explain the difference between home health and [place] [facility] and explained that [facility] will not interfere or replace [name] provided by home health. Despite lengthy discussion, James [name] decided he would rather talk to Dr. Benay Spice about [place] [facility] before he consented to [name].[facility] Care Management brochure left at bedside. Will make inpatient RNCM aware of visit and that patient declined [name].  Marthenia Rolling, MSN-Ed, RN,BSN [facility] Liaison [phone]

## 2015-05-19 NOTE — Progress Notes (Signed)
Dr Earlie Counts called to update on pt unable to void post catheter removal. Pt bladder scanned for 50-60cc of urine. No new orders given.

## 2015-05-20 LAB — BASIC METABOLIC PANEL
ANION GAP: 8 (ref 5–15)
BUN: 26 mg/dL — ABNORMAL HIGH (ref 6–20)
CALCIUM: 7.9 mg/dL — AB (ref 8.9–10.3)
CO2: 18 mmol/L — ABNORMAL LOW (ref 22–32)
CREATININE: 1.19 mg/dL (ref 0.61–1.24)
Chloride: 105 mmol/L (ref 101–111)
GFR calc Af Amer: >60 mL/min (ref >60)
GFR calc non Af Amer: 54 mL/min — ABNORMAL LOW (ref >60)
Glucose, Bld: 111 mg/dL — ABNORMAL HIGH (ref 65–99)
POTASSIUM: 3.9 mmol/L (ref 3.5–5.1)
Sodium: 131 mmol/L — ABNORMAL LOW (ref 135–145)

## 2015-05-20 LAB — CULTURE, BLOOD (ROUTINE X 2)

## 2015-05-20 MED ORDER — HEPARIN SOD (PORK) LOCK FLUSH 100 UNIT/ML IV SOLN
500.0000 [IU] | INTRAVENOUS | Status: DC
  Filled 2015-05-20: qty 5

## 2015-05-20 MED ORDER — MAGIC MOUTHWASH: 10.0000 mL | Freq: Three times a day (TID) | ORAL | Status: DC

## 2015-05-20 MED ORDER — MAGIC MOUTHWASH
10.0000 mL | Freq: Three times a day (TID) | ORAL | Status: DC
  Administered 2015-05-20: 10 mL via ORAL
  Filled 2015-05-20: qty 10

## 2015-05-20 MED ORDER — HEPARIN SOD (PORK) LOCK FLUSH 100 UNIT/ML IV SOLN
500.0000 [IU] | INTRAVENOUS | Status: DC | PRN
  Administered 2015-05-20: 500 [IU]
  Filled 2015-05-20 (×2): qty 5

## 2015-05-20 MED ORDER — HYDROCORTISONE NA SUCCINATE PF 100 MG IJ SOLR
50.0000 mg | Freq: Once | INTRAMUSCULAR | Status: AC
  Administered 2015-05-20: 50 mg via INTRAVENOUS
  Filled 2015-05-20: qty 2

## 2015-05-20 MED ORDER — PREDNISONE 5 MG PO TABS: 5.0000 mg | ORAL_TABLET | Freq: Every day | ORAL | Status: DC

## 2015-05-20 NOTE — Progress Notes (Signed)
Notified [facility] of dc home today with HH. Jonnie Finner RN CCM Case Mgmt phone [phone]

## 2015-05-20 NOTE — Progress Notes (Signed)
Discharge instructions and prescriptions were given/discussed with patient and daughters. Port de-accessed by IV team. Patient dressed, cleaned and taken out via wheelchair by NT at 1250.

## 2015-05-20 NOTE — Discharge Summary (Signed)
Physician Discharge Summary  James [name] GLO:[id number] DOB: 1928/02/13 [name]: 05/15/2015  PCP: [name], MD  Admit date: 05/15/2015 Discharge date: 05/20/2015  Time spent: 20 minutes  Recommendations for Outpatient Follow-up:  1. Follow up with Oncology as scheduled 2. Follow up with Cardiology as scheduled 3. 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 Briefly, pt presented with sob with n/v Pt was found to be hypoxic and was ultimately admitted for suspected sepsis with pneumonia.  Hospital Course:  Sepsis secondary to possible aspiration pneumonia/HCAP with strep pneumonia and strep bacteremia - Patient reportedly had large nonbloody emesis prior to onset of symptoms.  - Serial CXR noted to be unremarkable.  - Blood cultures were pos 1/2 for strep veridians - Urinary streptococcal antigen was positive.  - Speech therapy has evaluated and recommend regular consistency diet. On heart healthy diet - clinically improved with improved leukocytosis - Pt was continued on IV cefepime and vancomycin and had been transitioned to levaquin to complete course on discharge  Acute on stage III chronic kidney disease - Baseline creatinine not entirely clear (possibly  1.4-1.7) - Acute renal failure secondary to dehydration and sepsis that fully resolved  Anion gap metabolic acidosis - Multifactorial secondary to acute on chronic kidney disease and lactic acidosis. AG on admission: 17 and bicarbonate was 15 - Lactate has since normalized.  Chronic anemia - Multifactorial: Chronic disease, chronic kidney disease, cancer chemotherapy and chronic GI blood loss - Baseline hemoglobin may be around 8 - No overt bleeding noted - Hemoglobin remained stable at around 8 - Cont to follow hgb and plan to transfuse if hemoglobin less than 7  Acute respiratory failure with hypoxia - Most likely related to aspiration following episode of large emesis - Low index of suspicion for pulmonary embolism even though it cannot be completely ruled out - Low index of suspicion for PE. Even if confirmed, pt is not candidate for anticoagulation secondary to chronic GI bleed and transfusion-dependent anemia.  Chronic GI bleed/GERD - PPI  Chronic diastolic CHF - Stable - Will resume diuretics  Chronic prednisone use - Continued IV hydrocortisone for now - Wean steroids as tolerated  Marked leukocytosis - Suspect secondary to sepsis - Has not been on growth stimulating factors per oncology - Had been improving with antimicrobials   Hypothyroid - Continue on Synthroid - Free T4 was borderline low at 0.44 - Have increased dose of synthroid to 54mcg  CAD status post DES - Asymptomatic  History of left lower extremity DVT/portal vein thrombosis - Not on anticoagulants PTA secondary to chronic GI bleed/anemia  Duodenal cancer with liver metastatic disease -Status post 2 cycles of FOLFOX and was due for the  third cycle 5/24 but overall performance status has significantly declined. Oncology input appreciated and may not be a candidate for further treatments. - Pt has follow up appt on 5/31  Failure to thrive - Multifactorial  History of ITP - Platelet counts  currently normal  Acute urinary retention - As per patient, initially had in and out but continued to have difficulty urinating and hence Foley catheter was placed. - Patient voided well without foley just prior to discharge   Elevated troponin - No reported chest pain - Suspect from demand ischemia related to hypotension, sepsis, acute on chronic kidney disease  Hypotension - suspect secondary to bradycardia from hypothyroid - Responded well to IVF hydration and improved with thyroid replacement and steroids - Will have patient complete PO steroid taper on discharge  Bradycardia - Asymptomatic - Not on beta blockers - Daughter had earlier called Cardiology office to attempt to obtain an in-house consult herself - Had consulted Cardiology. Pt noted to be hypothyroid and Cardiology had since signed off   Consultations:  Cardiology  Oncology  Discharge Exam: Filed Vitals:   05/20/15 0700 05/20/15 0800 05/20/15 0900 05/20/15 1000  BP: 100/52 100/52 115/52 102/52  Pulse: 59 61 63 60  Temp:  98 F (36.7 C)    TempSrc:  Oral    Resp: 14 23 17 18   Height:      Weight:      SpO2: 97% 97% 97% 98%    General: Awake, in nad Cardiovascular: regular, s1, s2 Respiratory: normal resp effort, no wheezing  Discharge Instructions     Medication List    TAKE these medications        benzonatate 200 MG capsule  Commonly known as:  TESSALON  Take 1 capsule (200 mg total) by mouth 3 (three) times daily as needed.     dibucaine 1 % ointment  Commonly known as:  NUPERCAINAL  Apply topically 3 (three) times daily as needed for pain.     feeding supplement (ENSURE ENLIVE) Liqd  Take 237 mLs by mouth 3 (three) times daily between meals.     finasteride 5 MG tablet  Commonly known as:  PROSCAR  Take 5 mg by mouth daily.     furosemide 40 MG tablet  Commonly known as:  LASIX  Take 0.5 tablets (20 mg total) by mouth daily.     hydrocortisone 2.5 % rectal cream  Commonly  known as:  ANUSOL-HC  Apply topically 2 (two) times daily.     KLOR-CON M20 20 MEQ tablet  Generic drug:  potassium chloride SA  TAKE 1 TABLET DAILY     levofloxacin 750 MG tablet  Commonly known as:  LEVAQUIN  Take 1 tablet (750 mg total) by mouth daily.     levothyroxine 50 MCG tablet  Commonly known as:  SYNTHROID, LEVOTHROID  Take 1 tablet (50 mcg total) by mouth daily before breakfast.     lidocaine-prilocaine cream  Commonly known as:  EMLA  Apply 1 application topically as needed.     LIVALO 4 MG Tabs  Generic drug:  Pitavastatin Calcium  TAKE 1 TABLET AT BEDTIME     loperamide 2 MG tablet  Commonly known as:  IMODIUM A-D  Take 1 tablet (2 mg total) by mouth 2 (two) times daily as needed for diarrhea or loose stools.     magic mouthwash Soln  Take 10 mLs by mouth 3 (three) times daily.     METAMUCIL PO  Take 20 mLs by  mouth daily as needed (constipation).     multivitamin with minerals Tabs tablet  Take 1 tablet by mouth daily.     nitroGLYCERIN 0.4 MG SL tablet  Commonly known as:  NITROSTAT  Place 1 tablet (0.4 mg total) under the tongue every 5 (five) minutes as needed. For chest pain     OVER THE COUNTER MEDICATION  Place 2 drops into both eyes 4 (four) times daily. Wash type eye drop-     oxyCODONE-acetaminophen 5-325 MG per tablet  Commonly known as:  ROXICET  Take 1 tablet by mouth every 4 (four) hours as needed for severe pain.     pantoprazole 40 MG tablet  Commonly known as:  PROTONIX  TAKE 1 TABLET DAILY     POLY-IRON 150 FORTE 150-0.025-1 MG Caps  Generic drug:  Iron Polysacch Cmplx-B12-FA  TAKE 1 CAPSULE TWICE A DAY     pramoxine 1 % foam  Commonly known as:  PROCTOFOAM  Place 1 application rectally 3 (three) times daily as needed for itching.     predniSONE 5 MG tablet  Commonly known as:  DELTASONE  Take 1 tablet (5 mg total) by mouth daily with breakfast.     predniSONE 5 MG tablet  Commonly known as:  DELTASONE  Take 1 tablet (5  mg total) by mouth daily with breakfast.  Start taking on:  05/21/2015     PROBIOTIC & ACIDOPHILUS EX ST PO  Take 1 tablet by mouth every morning.     prochlorperazine 5 MG tablet  Commonly known as:  COMPAZINE  Take 1 tablet (5 mg total) by mouth every 6 (six) hours as needed for nausea or vomiting.     spironolactone 25 MG tablet  Commonly known as:  ALDACTONE  Take 0.5 tablets (12.5 mg total) by mouth daily.     Vitamin D3 2000 UNITS capsule  Take 2,000 Units by mouth every morning.       Allergies  Allergen Reactions  . Sulfa Antibiotics Other (See Comments)    Caused patient have ITP  . Aleve [Naproxen Sodium] Other (See Comments)    Causes platlet drop  . Rofecoxib Other (See Comments)    Celebrex  - affected low platelets  . Sulfamethoxazole-Trimethoprim Other (See Comments)     Causes low platelets and bleeding  . Tape Other (See Comments)    Adhesive tape makes skin tear.  . Tramadol Hcl Other (See Comments)    dizziness, drowsiness  . Neomycin-Bacitracin Zn-Polymyx Rash  . Neosporin [Neomycin-Polymyxin-Gramicidin] Rash  . Penicillins Swelling and Rash    Took dose of amoxicillin 2000mg  02/02/15 at dentist office without any complications   Follow-up Information    Follow up with [name], MD. Schedule an appointment as soon as possible for a visit in 1 week.   Specialty:  Family Medicine   Why:  Hospital follow up   Contact information:   [place] [address] [phone]       Follow up with Betsy Coder, MD. Go on 05/23/2015.   Specialty:  Oncology   Why:  as scheduled   Contact information:   [place] [address] [phone]       Follow up with [facility].   Why:  Home Health Physical Therapy and aide   Contact information:   [address] [place] [address] [phone]        The results of significant diagnostics from this hospitalization (  including  imaging, microbiology, ancillary and laboratory) are listed below for reference.    Significant Diagnostic Studies: Dg Chest 2 View  05/16/2015   CLINICAL DATA:  Shortness of breath with fever and chills. Metastatic duodenum cancer  EXAM: CHEST  2 VIEW  COMPARISON:  May 15, 2015  FINDINGS: Port-A-Cath tip is in the superior vena cava. No pneumothorax. There is no edema or consolidation. There is slight bibasilar lung scarring. Heart is upper limits normal in size with pulmonary vascularity within normal limits. No adenopathy. No bone lesions.  IMPRESSION: Slight bibasilar lung scarring.  No edema or consolidation.   Electronically Signed   By: Lowella Grip III M.D.   On: 05/16/2015 11:46   Dg Chest 2 View  05/15/2015   CLINICAL DATA:  Pt from home, complains of emesis and dizziness, denies weakness. Hx colon cancer.  EXAM: CHEST  2 VIEW  COMPARISON:  Cough 04/12/2015  FINDINGS: Cardiac silhouette mildly enlarged. No mediastinal or hilar masses or evidence of adenopathy.  Minor reticular scarring in the antral lateral lung bases. This is unchanged. No lung consolidation or edema. No pleural effusion or pneumothorax.  Right anterior chest wall Port-A-Cath is stable.  Bony thorax is intact.  IMPRESSION: No acute cardiopulmonary disease.   Electronically Signed   By: Lajean Manes M.D.   On: 05/15/2015 17:08    Microbiology: Recent Results (from the past 240 hour(s))  Blood culture (routine x 2)     Status: None   Collection Time: 05/15/15  6:41 PM  Result Value Ref Range Status   Specimen Description BLOOD RIGHT HAND  Final   Special Requests BOTTLES DRAWN AEROBIC AND ANAEROBIC 5 CCEACH  Final   Culture   Final    STREPTOCOCCUS SPECIES Note: THE SIGNIFICANCE OF ISOLATING THIS ORGANISM FROM A SINGLE SET OF BLOOD CULTURES WHEN MULTIPLE SETS ARE DRAWN IS UNCERTAIN. PLEASE NOTIFY THE MICROBIOLOGY DEPARTMENT WITHIN ONE WEEK IF SPECIATION AND SENSITIVITIES ARE REQUIRED. VIRIDANS STREPTOCOCCUS Note:  PREVIOUSLY REPORTED AS GRAM VARIABLE RODS CORRECTED RESULTS CALLED TO: [facility] WILLARD RN 05/19/15 AT 70 BY MORAC Gram Stain Report Called to,Read Back By and Verified With: [facility] MORGAN @ [phone] ON [phone] BY [facility] Performed at [facility]    Report Status 05/20/2015 FINAL  Final  Blood culture (routine x 2)     Status: None (Preliminary result)   Collection Time: 05/15/15  6:42 PM  Result Value Ref Range Status   Specimen Description BLOOD PORTA CATH CHEST  Final   Special Requests BOTTLES DRAWN AEROBIC AND ANAEROBIC 5CC EACH  Final   Culture   Final           BLOOD CULTURE RECEIVED NO GROWTH TO DATE CULTURE WILL BE HELD FOR 5 DAYS BEFORE ISSUING A FINAL NEGATIVE REPORT Performed at [facility]    Report Status PENDING  Incomplete  MRSA PCR Screening     Status: None   Collection Time: 05/15/15 11:48 PM  Result Value Ref Range Status   MRSA by PCR NEGATIVE NEGATIVE Final    Comment:        The GeneXpert MRSA Assay (FDA approved for NASAL specimens only), is one component of a comprehensive MRSA colonization surveillance program. It is not intended to diagnose MRSA infection nor to guide or monitor treatment for MRSA infections.   Culture, sputum-assessment     Status: None   Collection Time: 05/16/15  9:24 AM  Result Value Ref Range Status   Specimen Description SPUTUM  Final  Special Requests NONE  Final   Sputum evaluation   Final    MICROSCOPIC FINDINGS SUGGEST THAT THIS SPECIMEN IS NOT REPRESENTATIVE OF LOWER RESPIRATORY SECRETIONS. PLEASE RECOLLECT. INFORMED SHEPHERD,G. RN @1015  ON 5.24.16 BY MCCOY,N.    Report Status 05/16/2015 FINAL  Final     Labs: Basic Metabolic Panel:  Recent Labs Lab 05/15/15 1643 05/16/15 0510 05/17/15 0345 05/18/15 0550 05/20/15 0530  NA 132* 134* 133* 134* 131*  K 4.4 4.6 4.5 4.2 3.9  CL 100* 104 109 108 105  CO2 15* 18* 16* 17* 18*  GLUCOSE 148* 119* 124* 121* 111*  BUN 21* 23* 23* 21* 26*  CREATININE 1.89*  1.59* 1.44* 1.25* 1.19  CALCIUM 9.0 7.9* 7.4* 8.0* 7.9*   Liver Function Tests:  Recent Labs Lab 05/15/15 1643 05/16/15 0510  AST 44* 35  ALT 57 44  ALKPHOS 284* 195*  BILITOT 1.1 0.7  PROT 6.9 5.2*  ALBUMIN 2.6* 2.0*   No results for input(s): LIPASE, AMYLASE in the last 168 hours. No results for input(s): AMMONIA in the last 168 hours. CBC:  Recent Labs Lab 05/15/15 1643 05/16/15 0510 05/17/15 0345 05/18/15 0550 05/19/15 0547  WBC 30.5* 24.7* 18.1* 15.2* 17.3*  NEUTROABS 28.1* 21.7* 16.1*  --   --   HGB 11.4* 8.6* 7.7* 8.2* 8.5*  HCT 36.0* 27.3* 24.1* 26.3* 26.6*  MCV 88.2 88.1 88.0 88.6 88.1  PLT 438* 266 214 206 195   Cardiac Enzymes:  Recent Labs Lab 05/15/15 1643  TROPONINI 0.06*   BNP: BNP (last 3 results)  Recent Labs  05/15/15 1644  BNP 157.2*    ProBNP (last 3 results)  Recent Labs  06/06/14 1244 11/29/14 0944 03/22/15 1513  PROBNP 83.0 114.6 71.0    CBG: No results for input(s): GLUCAP in the last 168 hours.   Signed:  CHIU, Orpah Melter  [facility] 05/20/2015, 4:01 PM

## 2015-05-21 LAB — CULTURE, BLOOD (ROUTINE X 2): Culture: NO GROWTH

## 2015-05-23 ENCOUNTER — Other Ambulatory Visit (HOSPITAL_BASED_OUTPATIENT_CLINIC_OR_DEPARTMENT_OTHER): Payer: Medicare Other

## 2015-05-23 ENCOUNTER — Telehealth: Payer: Self-pay | Admitting: Family Medicine

## 2015-05-23 ENCOUNTER — Ambulatory Visit: Payer: Medicare Other

## 2015-05-23 ENCOUNTER — Telehealth: Payer: Self-pay

## 2015-05-23 ENCOUNTER — Ambulatory Visit (HOSPITAL_BASED_OUTPATIENT_CLINIC_OR_DEPARTMENT_OTHER): Payer: Medicare Other | Admitting: Nurse Practitioner

## 2015-05-23 ENCOUNTER — Telehealth: Payer: Self-pay | Admitting: Oncology

## 2015-05-23 VITALS — BP 96/57 | HR 91 | Resp 18 | Ht 72.0 in | Wt 204.0 lb

## 2015-05-23 DIAGNOSIS — B370 Candidal stomatitis: Secondary | ICD-10-CM

## 2015-05-23 DIAGNOSIS — K644 Residual hemorrhoidal skin tags: Secondary | ICD-10-CM

## 2015-05-23 DIAGNOSIS — C787 Secondary malignant neoplasm of liver and intrahepatic bile duct: Secondary | ICD-10-CM | POA: Diagnosis not present

## 2015-05-23 DIAGNOSIS — D500 Iron deficiency anemia secondary to blood loss (chronic): Secondary | ICD-10-CM | POA: Diagnosis not present

## 2015-05-23 DIAGNOSIS — L89329 Pressure ulcer of left buttock, unspecified stage: Secondary | ICD-10-CM

## 2015-05-23 DIAGNOSIS — D6481 Anemia due to antineoplastic chemotherapy: Secondary | ICD-10-CM

## 2015-05-23 DIAGNOSIS — D638 Anemia in other chronic diseases classified elsewhere: Secondary | ICD-10-CM

## 2015-05-23 DIAGNOSIS — Z85820 Personal history of malignant melanoma of skin: Secondary | ICD-10-CM | POA: Diagnosis not present

## 2015-05-23 DIAGNOSIS — C170 Malignant neoplasm of duodenum: Secondary | ICD-10-CM

## 2015-05-23 DIAGNOSIS — G893 Neoplasm related pain (acute) (chronic): Secondary | ICD-10-CM | POA: Diagnosis not present

## 2015-05-23 DIAGNOSIS — B379 Candidiasis, unspecified: Secondary | ICD-10-CM | POA: Diagnosis not present

## 2015-05-23 LAB — CBC WITH DIFFERENTIAL/PLATELET
BASO%: 0.1 % (ref 0.0–2.0)
Basophils Absolute: 0 10*3/uL (ref 0.0–0.1)
EOS%: 0 % (ref 0.0–7.0)
Eosinophils Absolute: 0 10*3/uL (ref 0.0–0.5)
HEMATOCRIT: 26.8 % — AB (ref 38.4–49.9)
HEMOGLOBIN: 8.4 g/dL — AB (ref 13.0–17.1)
LYMPH%: 3.4 % — AB (ref 14.0–49.0)
MCH: 28 pg (ref 27.2–33.4)
MCHC: 31.3 g/dL — ABNORMAL LOW (ref 32.0–36.0)
MCV: 89.3 fL (ref 79.3–98.0)
MONO#: 1.5 10*3/uL — ABNORMAL HIGH (ref 0.1–0.9)
MONO%: 5 % (ref 0.0–14.0)
NEUT#: 27.1 10*3/uL — ABNORMAL HIGH (ref 1.5–6.5)
NEUT%: 91.5 % — AB (ref 39.0–75.0)
PLATELETS: 257 10*3/uL (ref 140–400)
RBC: 3 10*6/uL — ABNORMAL LOW (ref 4.20–5.82)
RDW: 21.5 % — ABNORMAL HIGH (ref 11.0–14.6)
WBC: 29.6 10*3/uL — ABNORMAL HIGH (ref 4.0–10.3)
lymph#: 1 10*3/uL (ref 0.9–3.3)
nRBC: 1 % — ABNORMAL HIGH (ref 0–0)

## 2015-05-23 LAB — COMPREHENSIVE METABOLIC PANEL (CC13)
ALT: 76 U/L — AB (ref 0–55)
ANION GAP: 13 meq/L — AB (ref 3–11)
AST: 33 U/L (ref 5–34)
Albumin: 2.3 g/dL — ABNORMAL LOW (ref 3.5–5.0)
Alkaline Phosphatase: 218 U/L — ABNORMAL HIGH (ref 40–150)
BILIRUBIN TOTAL: 0.83 mg/dL (ref 0.20–1.20)
BUN: 27.8 mg/dL — ABNORMAL HIGH (ref 7.0–26.0)
CO2: 17 mEq/L — ABNORMAL LOW (ref 22–29)
CREATININE: 1.1 mg/dL (ref 0.7–1.3)
Calcium: 8 mg/dL — ABNORMAL LOW (ref 8.4–10.4)
Chloride: 106 mEq/L (ref 98–109)
EGFR: 60 mL/min/{1.73_m2} — ABNORMAL LOW (ref >90)
Glucose: 161 mg/dl — ABNORMAL HIGH (ref 70–140)
Potassium: 3.9 mEq/L (ref 3.5–5.1)
Sodium: 137 mEq/L (ref 136–145)
Total Protein: 5.4 g/dL — ABNORMAL LOW (ref 6.4–8.3)

## 2015-05-23 LAB — HOLD TUBE, BLOOD BANK

## 2015-05-23 LAB — TECHNOLOGIST REVIEW

## 2015-05-23 MED ORDER — FLUCONAZOLE 100 MG PO TABS: 100.0000 mg | ORAL_TABLET | Freq: Every day | ORAL | Status: DC

## 2015-05-23 NOTE — Telephone Encounter (Signed)
I can't tell if this was addressed or not.  Please check and let me know if I need to do anything.  Thanks.

## 2015-05-23 NOTE — Telephone Encounter (Signed)
James [name] with [facility] left v/m as FYI to DR Damita Dunnings that James [name] is faxing paperwork to get pt a hospital bed. Pt seen 04/24/15.

## 2015-05-23 NOTE — Telephone Encounter (Signed)
Please give the order to eval and treat pressure ulcer.  Thanks.

## 2015-05-23 NOTE — Telephone Encounter (Signed)
James [name] from [facility] called and is asking for the wound care order.  She left a message over the weekend without a response.  Please call James [name] at [phone].

## 2015-05-23 NOTE — Progress Notes (Addendum)
James [name] OFFICE PROGRESS NOTE   Diagnosis:  Small bowel carcinoma  INTERVAL HISTORY:   James [name] returns as scheduled. He completed cycle 2 FOLFOX 04/25/2015. Cycle 3 was held due to poor performance status. He was subsequently hospitalized 05/15/2015 with a fever and altered mental status potentially related to a systemic infection. He was discharged home on 05/20/2015.  His daughter feels his strength has improved since discharge. He plans on beginning physical therapy this week. Appetite continues to be poor. No change in baseline dyspnea on exertion. Bowels are moving. He notes that his stools are black. His daughter saw a "blood clot" when he had a bowel movement last night. No fever. He had mild confusion initially upon returning home. His daughter reports this has improved. They report he has a sacral pressure ulcer that is painful.  Objective:  Vital signs in last 24 hours:  Blood pressure 96/57, pulse 91, resp. rate 18, height 6' (1.829 m), weight 204 lb (92.534 kg), SpO2 100 %. repeat heart rate approximately 60.   We were unable to maneuver him onto the exam table despite assistance from me and the nurse. HEENT: He has thrush. No ulcerations. Resp: Lungs are clear. Cardio: Distant heart sounds. Regular rate and rhythm. GI: Nontender. Vascular: Pitting edema at the lower legs bilaterally. Neuro: Alert and oriented. Follows commands. Moves all extremities.  Skin: Ecchymoses at the bilateral hip regions. Sacral erythema. Small approximate 1-1/2 cm pressure ulcer with superficial ulceration at the left medial buttock. Port-A-Cath without erythema.  Lab Results:  Lab Results  Component Value Date   WBC 29.6* 05/23/2015   HGB 8.4* 05/23/2015   HCT 26.8* 05/23/2015   MCV 89.3 05/23/2015   PLT 257 05/23/2015   NEUTROABS 27.1* 05/23/2015    Imaging:  No results found.  Medications: I have reviewed the patient's current  medications.  Assessment/Plan: 1.Duodenal mass, abdominal/retroperitoneal lymphadenopathy, and liver metastases noted on a CT 03/01/2015  Upper endoscopy 03/03/2015 confirmed a duodenal mass, status post a biopsy with the final pathology confirming a carcinoma, immunohistochemical stains negative for lymphoma, neuroendocrine carcinoma, and melanoma markers. Positive staining for CDX2, cytokeratin AE1/AE3, and CD10  PET scan 03/29/2015 showed a large intensely hypermetabolic mass circumferentially narrowing the second portion of the duodenum. Adjacent hypermetabolic lymph nodes medial to the duodenum mass. Irregular hypermetabolic nodal mass in the inferior aspect of the gastrohepatic ligament. Hypermetabolic nodal metastasis in the retroperitoneum left of the aorta. No abnormal metabolic activity within the enlarged inferior right lobe of the liver.  Cycle 1 FOLFOX 04/11/2015  Cycle 2 FOLFOX 04/25/2015  2. Gastric outlet obstruction secondary to #1, status post a palliative gastrojejunostomy 03/09/2015  3. 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 He is transfused for symptoms.  4. Portal vein thrombosis diagnosed in September 2015. Off of anticoagulation due to GI bleeding.  5. Left leg DVT December 2014. Off of anticoagulation due to GI bleeding.  6. History of coronary artery disease, status post an LAD drug-eluting stent July 2015  7. Remote history of melanoma of the right ear  8. 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 History of drug related ITP  10. Abdominal pain secondary to the duodenal mass/liver  metastases/adenopathy  11. Neuropathy  12. Altered mental status 04/12/2015-resolved  13. External hemorrhoid-referred to Dr. Excell Seltzer  14. Admission 05/15/2015 with a fever and altered mental status-potentially related to a systemic infection, one blood culture positive for "gram variable rods "and urine streptococcus pneumoniae antigen positive. Discharged 05/20/2015.  15. Oral candidiasis. He will complete a course of Diflucan.  16. Small pressure ulcer left medial buttock. Referral made for a home wound care nurse.   Disposition: James [name] continues to have a poor performance status. In his current condition he is not a candidate for chemotherapy. This was reviewed with James [name] and his daughter. They are in agreement. Dr. Benay Spice recommends reevaluation in 2 weeks. If his performance status improves we can consider resuming chemotherapy. If the performance status has not improved we will focus on supportive/comfort care. He will work on increasing his activity level and oral intake over the next 2 weeks.  For the oral candidiasis he will complete a 5 day course of Diflucan.  We made a referral for a home wound care nurse to evaluate the small pressure ulcer at the left medial buttock.  Patient seen with Dr. Benay Spice. 25 minutes were spent face-to-face at today's visit with the majority of that time involved in counseling/coordination of care.  Ned Card ANP/GNP-BC   05/23/2015  11:10 AM  This was a shared visit with Ned Card. James [name] continues to have a poor performance status and is not a candidate for chemotherapy. I discussed the situation with James [name] and his daughter. He will return for reevaluation in 2 weeks. We will decide on continuing chemotherapy versus hospice. He is unable to care for himself and needs constant supervision at home.  Julieanne Manson, M.D.

## 2015-05-23 NOTE — Telephone Encounter (Signed)
PLEASE NOTE: All timestamps contained within this report are represented as [place] Standard Time. CONFIDENTIALTY NOTICE: This fax transmission is intended only for the addressee. It contains information that is legally privileged, confidential or otherwise protected from use or disclosure. If you are not the intended recipient, you are strictly prohibited from reviewing, disclosing, copying using or disseminating any of this information or taking any action in reliance on or regarding this information. If you have received this fax in error, please notify us immediately by telephone so that we can arrange for its return to [place]. Phone: [phone], Toll-Free: [phone], Fax: [phone] Page: 1 of 1 Call Id: [id number] Riddle Patient Name: James [name] Gender: Male DOB: May 11, 1928 Age: 81 Y 70 M 16 D Return Phone Number: Address: City/State/Zip: [place] Client [place] Night - Client Client Site [place] Physician Renford Dills Contact Type Call Call Type Page Only Caller Name Claire Shown Relationship To Patient Provider Is this call to report lab results? No Return Phone Number Please choose phone number Initial Comment Caller States Claire Shown from [facility] at ph [phone] needs orders to care for Pressure Ulcer on Pt Nurse Assessment Guidelines Guideline Title Affirmed Question Affirmed Notes Nurse Date/Time (Kay Time) Disp. Time Eilene Ghazi Time) Disposition Final User 05/22/2015 10:51:11 AM Send to [facility] Paging Queue Almon Register 05/22/2015 10:57:38 AM Paged On Call to Other Provider Dalia Heading 05/22/2015 10:57:55 AM Page Completed Yes Dalia Heading After Care Instructions Given Call Event Type User Date / Time Description Paging DoctorName Phone DateTime Result/Outcome Message Type Notes Gwendolyn Grant [phone] 05/22/2015 10:57:38 AM Paged On Call to Other Provider Doctor Paged [facility]: Please call Janelle at [phone] regarding [name]. Gwendolyn Grant 05/22/2015 10:57:42 AM Paged On Call to Another Provider Message Result

## 2015-05-23 NOTE — Telephone Encounter (Signed)
Noted, will await paperwork.

## 2015-05-23 NOTE — Telephone Encounter (Signed)
Janel advised.

## 2015-05-23 NOTE — Telephone Encounter (Signed)
Pt confirmed labs/ov per 05/31 POF, gave pt AVS and Calendar..... KJ, s/w MD schedule 06/16 8:30 pt needed morning appt.

## 2015-05-24 ENCOUNTER — Ambulatory Visit (HOSPITAL_COMMUNITY)
Admission: RE | Admit: 2015-05-24 | Discharge: 2015-05-24 | Disposition: A | Discharge status: Home / Self Care | Payer: Medicare Other | Source: Ambulatory Visit | Attending: Cardiology | Admitting: Cardiology

## 2015-05-24 VITALS — BP 100/52 | Wt 206.5 lb

## 2015-05-24 DIAGNOSIS — Z79899 Other long term (current) drug therapy: Secondary | ICD-10-CM | POA: Diagnosis not present

## 2015-05-24 DIAGNOSIS — R0602 Shortness of breath: Secondary | ICD-10-CM

## 2015-05-24 DIAGNOSIS — I2510 Atherosclerotic heart disease of native coronary artery without angina pectoris: Secondary | ICD-10-CM | POA: Diagnosis not present

## 2015-05-24 DIAGNOSIS — Z8249 Family history of ischemic heart disease and other diseases of the circulatory system: Secondary | ICD-10-CM | POA: Diagnosis not present

## 2015-05-24 DIAGNOSIS — Z85820 Personal history of malignant melanoma of skin: Secondary | ICD-10-CM | POA: Diagnosis not present

## 2015-05-24 DIAGNOSIS — E785 Hyperlipidemia, unspecified: Secondary | ICD-10-CM | POA: Diagnosis not present

## 2015-05-24 DIAGNOSIS — I5032 Chronic diastolic (congestive) heart failure: Secondary | ICD-10-CM | POA: Insufficient documentation

## 2015-05-24 DIAGNOSIS — G4733 Obstructive sleep apnea (adult) (pediatric): Secondary | ICD-10-CM | POA: Diagnosis not present

## 2015-05-24 DIAGNOSIS — Z86718 Personal history of other venous thrombosis and embolism: Secondary | ICD-10-CM | POA: Diagnosis not present

## 2015-05-24 DIAGNOSIS — R0600 Dyspnea, unspecified: Secondary | ICD-10-CM | POA: Diagnosis not present

## 2015-05-24 DIAGNOSIS — Z9221 Personal history of antineoplastic chemotherapy: Secondary | ICD-10-CM | POA: Insufficient documentation

## 2015-05-24 DIAGNOSIS — Z87891 Personal history of nicotine dependence: Secondary | ICD-10-CM | POA: Insufficient documentation

## 2015-05-24 DIAGNOSIS — Z955 Presence of coronary angioplasty implant and graft: Secondary | ICD-10-CM | POA: Diagnosis not present

## 2015-05-24 DIAGNOSIS — E669 Obesity, unspecified: Secondary | ICD-10-CM | POA: Insufficient documentation

## 2015-05-24 DIAGNOSIS — Z9861 Coronary angioplasty status: Secondary | ICD-10-CM

## 2015-05-24 DIAGNOSIS — K219 Gastro-esophageal reflux disease without esophagitis: Secondary | ICD-10-CM | POA: Diagnosis not present

## 2015-05-24 DIAGNOSIS — Z8550 Personal history of malignant neoplasm of unspecified urinary tract organ: Secondary | ICD-10-CM | POA: Diagnosis not present

## 2015-05-24 DIAGNOSIS — D649 Anemia, unspecified: Secondary | ICD-10-CM | POA: Diagnosis not present

## 2015-05-24 DIAGNOSIS — I8001 Phlebitis and thrombophlebitis of superficial vessels of right lower extremity: Secondary | ICD-10-CM | POA: Diagnosis not present

## 2015-05-24 DIAGNOSIS — J449 Chronic obstructive pulmonary disease, unspecified: Secondary | ICD-10-CM | POA: Insufficient documentation

## 2015-05-24 DIAGNOSIS — C170 Malignant neoplasm of duodenum: Secondary | ICD-10-CM

## 2015-05-24 DIAGNOSIS — K921 Melena: Secondary | ICD-10-CM

## 2015-05-24 MED ORDER — NITROGLYCERIN 0.4 MG SL SUBL: 0.4000 mg | SUBLINGUAL_TABLET | SUBLINGUAL | Status: AC | PRN

## 2015-05-24 MED ORDER — NITROGLYCERIN 0.4 MG SL SUBL: 0.4000 mg | SUBLINGUAL_TABLET | SUBLINGUAL | Status: DC | PRN

## 2015-05-24 NOTE — Patient Instructions (Addendum)
Refill for nitroglycerin sent in to [facility] in [place].  Dr. Aundra Dubin will call you to address aspirin after speaking with GI specialist Dr. Cecilie Kicks.  Follow up 3 months.  Do the following things EVERYDAY: 1) Weigh yourself in the morning before breakfast. Write it down and keep it in a log. 2) Take your medicines as prescribed 3) Eat low salt foods-Limit salt (sodium) to 2000 mg per day.  4) Stay as active as you can everyday 5) Limit all fluids for the day to less than 2 liters

## 2015-05-24 NOTE — Progress Notes (Signed)
Patient ID: TAIJUAN SERVISS, male   DOB: 09-May-1928, 81 y.o.   MRN: [id number] PCP: Dr. Damita Dunnings Pulmonologist: Dr. Joya Gaskins Oncology: Dr. Benay Spice  81 yo with history of nonobstructive CAD and chronic diastolic CHF presents for cardiology followup.   In 11/14, he had a left leg DVT.  Only trigger seems to have been that he sat for 2 days in a row in a theater taking a test for the [facility] prior to the DVT.  He was on Xarelto for 6 months.     Patient has been on Lasix 80 mg daily for several years now.  He has chronic exertional dyspnea that waxes and wanes. He had an echo in 12/14 with EF 60-65% and aortic sclerosis without significant stenosis.  He was admitted in 6/15 with dyspnea. CTA chest was negative for PE.  Lower extremity dopplers showed a superficial thrombus but no DVT. He was found to be anemic with hemoglobin 7.9.  He got 1 unit PRBCs and IV iron.  He also was diuresed with IV Lasix.  He says that he really did not feel much better.  He did not have overt GI bleeding.  Lexiscan Cardiolite was done in 6/15, showing basal to mid inferior mild ischemia (overall low risk study).   Given ongoing significant exertional symptoms, I took him for [facility] in 7/15.  RHC showed normal filling pressures and cardiac output.  LHC showed a borderline LAD stenosis.  FFR was done, suggesting that the stenosis was hemodynamically significant.  Patient had DES to LAD.  He saw Dr. Beryle Beams shortly thereafter, and it was decided that he was low risk for future DVT so Xarelto was not started.   After the PCI, patient did not note any significant change to his chronic exertional dyspnea. He was diagnosed with portal vein thrombosis in 9/15 and started on coumadin.  He then had hematochezia and was admitted to [facility] later in 9/15.  Possible small bowel AVMs.  Capsule endoscopy was negative.  CT abdomen in 10/15 showed small ascites and subtotal portal vein occlusion.  He did not have cirrhosis.   He has  continued to have suspected slow GI blood loss possibly from small bowel AVMs.  EGD in 2/16 showed no bleeding site and capsule endoscopy in 2/16 showed a site of bleeding in the terminal ileum.  Around Thanksgiving, he developed gross hematuria.  He saw urology and was found to have a bladder tumor.  He had transurethral resection of the tumor in 2/16 with pathology showing a noninvasive low grade papillary urothelial malignancy.  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  He had palliative gastrojejunostomy in 3/16.  Due to ongoing GI bleeding, he has been off ASA and Plavix.  He is s/p 2 cycles of FOLFOX chemo but due to poor functional status, has not had further chemo.    He was admitted in 5/16 with fever, altered mental status, and probable PNA.  He was seen by cardiology during this hospitalization for asymptomatic bradycardia.   He is now back home with his daughter. He can walk around his house with a walker without dyspnea.  He is short of breath if he has to walk longer distances.  No chest pain.  No orthopnea/PND.  No lightheadedness.  Weight is down considerably due to poor appetite.    Labs (10/14): LDL 54, HDL 59 Labs (12/14): K 3.8, creatinine 0.9, HCT 40.3 Labs (6/15): K 3.7, creatinine 0.9,  hemoglobin 7.9 => 9.4, BNP 83 Labs (7/15): K 4.2, creatinine 0.92, HCT 32.9 Labs (11/15): K 4.3, creatinine 1.3 => 1.23, LDL 69, HDL 28, hgb 9.2 => 8.6 Labs (3/16): K 4.1, creatinine 1.1, HCT 27.2 Labs (5/16): K 3.9, creatinine 1.1, ALT 76, AST 32, HCT 26.8  PMH: 1. Carotid stenosis: Carotid dopplers (12/13) with 0-39% stenosis. 2/15 carotids with minimal stenosis.  2. Hyperlipidemia 3. Gout 4. ITP: 5/10.  Thought to be due to Septra.  5. Obesity 6. OA 7. BPH 8. Nephrolithiasis 9. OSA 10. GERD 11. H/o melanoma 12. CAD: LHC (6/05) with EF 60%, 50% mLCx stenosis, 40% pLAD stenosis.  No intervention.  Lexiscan Cardiolite (6/15) with EF 74%,  basal to mid inferior mild ischemia.   LHC (7/15) with 70% proximal LAD stenosis after D1, significant by FFR.  Patient had DES to proximal LAD.  13. DVT (11/14): Spontaneous.  APLAS serologies negative.  6/15 [place] for DVT showed no DVT but did show a superficial thrombus right lesser saphenous vein. LE doppler (06/17/14): No evidence of lower extremity DVT or incompetence, bilaterally. Acute, partially occlusive superficial thrombus in the right lesser saphenous vein, similar to that described in the report from 05/30/14. Resolved left calf DVT. Chronic non-occlusive thrombus in the left lesser saphenous vein 14. Diastolic CHF: Echo (66/59) with EF 60-65%, aortic sclerosis without significant stenosis.  RHC (7/15) with mean RA 2, PA 22/8, mean PCWP 7, CI 2.9.  Echo (3/16): EF 55-60%, mild focal basal septal hypertrophy, mild-moderate MR, PA systolic pressure 41 mmHg.  15. GI bleeding: Symptomatic anemia 6/15, ?GI bleeding but nothing overt.  Lower GI bleed in 9/15, possible small bowel AVMs.  Capsule endoscopy was negative.  EGD 2/16 showed no bleeding site.  Capsule endoscopy 2/16 showed a site of bleeding in the terminal ileum.  16. Portal vein thrombosis: Diagnosed in 9/15.  No cirrhosis noted on abdominal CT.  17. COPD: See appt with Dr Joya Gaskins 7/15.  18. Hematuria: Patient was found to have a low grade papillary urothelial malignancy and is status post TUR in 2/16.   19. Duodenal carcinoma: Liver mets, retroperitoneal and intra-abdominal lymphadenopathy.  S/p 2 cycles of FOLFOX chemo.  - Gastric outlet obstruction with gastrojejunostomy in 3/16.  20. Sacral decubitus  SH: Married, lives in [place], retired, 2 kids, quit smoking in 1968.   FH: h/o CVA and CAD  ROS: All systems reviewed and negative except as per HPI.   Current Outpatient Prescriptions  Medication Sig Dispense Refill  . benzonatate (TESSALON) 200 MG capsule Take 1 capsule (200 mg total) by mouth 3 (three) times daily as needed.  30 capsule 1  . Cholecalciferol (VITAMIN D3) 2000 UNITS capsule Take 2,000 Units by mouth every morning.     . dibucaine (NUPERCAINAL) 1 % ointment Apply topically 3 (three) times daily as needed for pain. 30 g 1  . feeding supplement, ENSURE ENLIVE, (ENSURE ENLIVE) LIQD Take 237 mLs by mouth 3 (three) times daily between meals. 237 mL 12  . finasteride (PROSCAR) 5 MG tablet Take 5 mg by mouth daily.    . fluconazole (DIFLUCAN) 100 MG tablet Take 1 tablet (100 mg total) by mouth daily. 5 tablet 0  . furosemide (LASIX) 40 MG tablet Take 0.5 tablets (20 mg total) by mouth daily. 30 tablet 3  . hydrocortisone (ANUSOL-HC) 2.5 % rectal cream Apply topically 2 (two) times daily. (Patient taking differently: Apply topically 2 (two) times daily as needed for hemorrhoids or itching. ) 30 g 0  .  levothyroxine (SYNTHROID, LEVOTHROID) 50 MCG tablet Take 1 tablet (50 mcg total) by mouth daily before breakfast. 30 tablet 0  . lidocaine-prilocaine (EMLA) cream Apply 1 application topically as needed. 30 g 0  . LIVALO 4 MG TABS TAKE 1 TABLET AT BEDTIME 90 tablet 3  . loperamide (IMODIUM A-D) 2 MG tablet Take 1 tablet (2 mg total) by mouth 2 (two) times daily as needed for diarrhea or loose stools.    . Multiple Vitamin (MULTIVITAMIN WITH MINERALS) TABS tablet Take 1 tablet by mouth daily.    . nitroGLYCERIN (NITROSTAT) 0.4 MG SL tablet Place 1 tablet (0.4 mg total) under the tongue every 5 (five) minutes as needed. For chest pain 25 tablet 1  . OVER THE COUNTER MEDICATION Place 2 drops into both eyes 4 (four) times daily. Wash type eye drop-    . oxyCODONE-acetaminophen (ROXICET) 5-325 MG per tablet Take 1 tablet by mouth every 4 (four) hours as needed for severe pain. 30 tablet 0  . pantoprazole (PROTONIX) 40 MG tablet TAKE 1 TABLET DAILY (Patient taking differently: TAKE 1 TABLET TWICE DAILY) 90 tablet 2  . POLY-IRON 150 FORTE 150-25-1 MG-MCG-MG CAPS TAKE 1 CAPSULE TWICE A DAY 60 each 2  . pramoxine (PROCTOFOAM)  1 % foam Place 1 application rectally 3 (three) times daily as needed for itching. 15 g 1  . pramoxine-hydrocortisone (PROCTOCREAM-HC) 1-1 % rectal cream Place 1 application rectally 2 (two) times daily.    . predniSONE (DELTASONE) 5 MG tablet Take 1 tablet (5 mg total) by mouth daily with breakfast. 30 tablet 1  . Probiotic Product (PROBIOTIC & ACIDOPHILUS EX ST PO) Take 1 tablet by mouth every morning.     . prochlorperazine (COMPAZINE) 5 MG tablet Take 1 tablet (5 mg total) by mouth every 6 (six) hours as needed for nausea or vomiting. 30 tablet 0  . Psyllium (METAMUCIL PO) Take 20 mLs by mouth daily as needed (constipation).     Marland Kitchen spironolactone (ALDACTONE) 25 MG tablet Take 0.5 tablets (12.5 mg total) by mouth daily. 60 tablet 2   No current facility-administered medications for this encounter.    BP 100/52 mmHg  Wt 206 lb 8 oz (93.668 kg) General: NAD, obese Neck: Thick, no JVD, no thyromegaly or thyroid nodule.  Lungs: Slight crackles at bases CV: Nondisplaced PMI.  Heart regular S1/S2, no S3/S4, no murmur. 1+ edema 1/2 to knees bilaterally.  No carotid bruit.  Normal pedal pulses.  Abdomen: Soft, mild tenderness right flank, no hepatosplenomegaly, no distention.  Skin: Intact without lesions or rashes.  Neurologic: Alert and oriented x 3.  Psych: Normal affect. Extremities: No clubbing or cyanosis.   Assessment/Plan: 1. Dyspnea: This has been chronic. No PE on CTA chest 6/15.  Lasix was increased but did not seem to help his symptoms much. He quit smoking years ago. LHC/RHC in 7/15 showed normal right and left heart filling pressures and a 70-80% proximal LAD stenosis that was hemodynamically significant by FFR.  Patient had DES to proximal LAD.  This did not help his dyspnea. Seen by pulmonary, suspect some COPD based on chest CT with emphysema and obstruction on PFTs.  However, weight and deconditioning as well as anemia likely play a large role.  He is now very limited s/p  chemotherapy for his duodenal carcinoma, poor functional status.  - Would continue Lasix 20 mg daily.  2. Hyperlipidemia: Good LDL in 11/15, continue [place].  3. DVT: Occurred in 11/14.  Serologies for APLAS were negative.  CTA chest in 6/15 was negative for PE but he had a superficial thrombus on lower extremity dopplers.  Repeat dopplers also showed the presence of superficial thrombus (no DVT extension).  He is off coumadin due to GI bleeding.  Suspect duodenal cancer predisposed to VTE.  4. Chronic diastolic CHF: NYHA class III symptoms, stable.  No JVD.  Would wear compression stockings for lower extremity edema.  Can continue Lasix 20 mg daily.  5. CAD: s/p DES to LAD in 7/15.  This did not help exertional dyspnea.  He is off Plavix and aspirin with recurrent GI bleeding.  I will ask Dr Carlean Purl if he thinks that Mr Urieta would be able to go back on low dose ASA.  6. Portal vein thrombosis: Suspect duodenal cancer was the predisposing factor.  Liver not cirrhotic on imaging.  Coumadin stopped due to GI bleeding.  7. GI bleeding: Chronic.  He is now off coumadin, ASA, and Plavix.  Will talk to Dr Carlean Purl about going back on low dose ASA.   8. Hematuria: Low grade papillary urothelial malignancy. 9. Duodenal carcinoma: S/p 2 cycles of FOLFOX.  Poor functional status.  To see Dr Benay Spice soon to discuss whether or not to continue with chemotherapy.   Followup in 3 months.   Loralie Champagne 05/24/2015

## 2015-05-25 ENCOUNTER — Encounter: Payer: Self-pay | Admitting: Primary Care

## 2015-05-25 ENCOUNTER — Ambulatory Visit (INDEPENDENT_AMBULATORY_CARE_PROVIDER_SITE_OTHER): Payer: Medicare Other | Admitting: Primary Care

## 2015-05-25 ENCOUNTER — Telehealth: Payer: Self-pay | Admitting: *Deleted

## 2015-05-25 ENCOUNTER — Telehealth: Payer: Self-pay

## 2015-05-25 ENCOUNTER — Telehealth: Payer: Self-pay | Admitting: Neurology

## 2015-05-25 VITALS — BP 104/60 | HR 61 | Temp 97.5°F | Ht 72.0 in | Wt 204.0 lb

## 2015-05-25 DIAGNOSIS — G629 Polyneuropathy, unspecified: Secondary | ICD-10-CM

## 2015-05-25 DIAGNOSIS — M7989 Other specified soft tissue disorders: Secondary | ICD-10-CM

## 2015-05-25 DIAGNOSIS — I2510 Atherosclerotic heart disease of native coronary artery without angina pectoris: Secondary | ICD-10-CM | POA: Diagnosis not present

## 2015-05-25 DIAGNOSIS — Z9861 Coronary angioplasty status: Secondary | ICD-10-CM

## 2015-05-25 NOTE — Telephone Encounter (Signed)
V/M left that [place] was in the home today and [facility] nurse advised pt to contact PCP and ask Dr Josefine Class office to contact [facility] about pts hospital bed. Pt needs hospital bed ASAP. Ms Wendling request cb.

## 2015-05-25 NOTE — Progress Notes (Signed)
Pre visit review using our clinic review tool, if applicable. No additional management support is needed unless otherwise documented below in the visit note. 

## 2015-05-25 NOTE — Patient Instructions (Addendum)
Stop by the front desk and speak with Ebony Hail regarding your leg scan and appointment with Dr. Jannifer Franklin. It was nice meeting you! I wish you the best!

## 2015-05-25 NOTE — Telephone Encounter (Signed)
I called James [name]. She stated the patient was having quite of a bit of pain with his neuropathy and his calves were swelling. Their office felt like he should be seen in our office within about a week. I called the patient and scheduled an appointment for 6/9 at 8 AM. I promised him if anything became available before that time, I would give him a call.

## 2015-05-25 NOTE — Progress Notes (Addendum)
Subjective:    Patient ID: James [name], male    DOB: 08-27-1928, 81 y.o.   MRN: [id number]  HPI  James [name] is an 81 year old male who presents today for hospital follow up.  James [name] presented to [facility] Emergency Department on 05/15/15 with a chief complaint of dizziness and vomiting. He was noted to by hypoxic, febrile, and have leukocytosis in the emergency department. He was admitted to the stepdown unit with a diagnosis of aspiration pneumonia with sepsis. He was treated with IV fluids and antibiotics. He was evaluated by speech therapy and noted to have normal swallowing function. He was released on 5/28 with instructions to follow up with Oncology and Cardiology.  Overall he's feeling much better since discharge except for ankle edema. He followed up with his cardiologist yesterday for bilateral lower ankle edema. His cardiologist told him that he would not recommend an increase in his Lasix at this time. He reports increased swelling to his ankles with tenderness since discharged from the hospital last Saturday. He's been elevating his legs at home which helps decrease the pain in his ankles. He's concerned he may have a blood clot. He denies shortness of breath, cough, and chest pain.  He also reports neuropathy to his fingers for the past several months but worsened tremendously while he was hospitalized. He has a history of neuropathy to his feet, but they are not bothersome at this time. He has a follow up appointment with Dr. Jannifer Franklin with Neurology in December 2016 but would like to be evaluated sooner.  He's currently being evaluated for Duodenal cancer with Liver metastatic disease and has an appointment on 06/08/15 with oncology.   Review of Systems  Constitutional: Negative for fever and chills.  Respiratory: Negative for shortness of breath.   Cardiovascular: Positive for leg swelling. Negative for chest pain.  Skin: Negative for color change.  Neurological: Positive for  numbness. Negative for dizziness and weakness.       Past Medical History  Diagnosis Date  . Bradycardia     Hypertensive  . carotid bruit, right   . hypercholesterolemia     Trig 234/293;takes Simcor daily  . Obesity, morbid   . Vasculitis   . 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 Beryle Beams; Dr. Beryle Beams , normal platelets  . Dermatitis     legs  . Bleeding gums   . Chronic low back pain   . Myalgia and myositis   . Unspecified vitamin D deficiency   . Bladder diverticulum   . Primary osteoarthritis of both knees   . Nephrolithiasis   . History of elevated glucose   . Thrombophlebitis     right forearm  . History of BPH   . History of colonic polyps     [name]  . Epididymitis, left     S/P Epidimectomy  . Splenomegaly 05/15/09    abd U/S NML  . Hypertension, benign     takes Micardis daily  . Neuropathy     acute  . Bruises easily   . H/O hiatal hernia   . GERD (gastroesophageal reflux disease)     hx of but doesn't take any meds now  . Urinary frequency   . Urinary urgency   . Nocturia   . Urinary leakage   . Abnormality of gait 03/19/2013  . DVT of lower limb, acute 11/19/2013    Peroneal & distal tibial left 11/01/13  . Polio 1941    "  mild case"  . CHF (congestive heart failure)     "low grade" (06/22/2014)  . DVT (deep venous thrombosis) 10/2013    LLE  . Pneumonia, bacterial     RML resolved, spirometry, normal  . Pneumonia 1933  . History of blood transfusion 05/2014  . Anemia 05/2014    "related to Xarelto"  . Iron deficiency anemia 05/2014    "got iron infusion"  . Arthritis     "all over"  . Gout   . Malignant melanoma of ear     right  . Thrombophlebitis of superficial veins of right lower extremity 06/23/2014    Incidental finding doppler 05/30/14  . Shortness of breath   . Esophageal varices without mention of bleeding 08/08/2014  . Portal vein thrombosis   . Duodenal cancer     History    Social History  . Marital Status: Widowed    Spouse Name: N/A  . Number of Children: 2  . Years of Education: college   Occupational History  . Retired     1996 [employer]   Social History Main Topics  . Smoking status: Former Smoker -- 2.00 packs/day for 20 years    Types: Cigarettes, Cigars    Quit date: 09/27/1967  . Smokeless tobacco: Never Used  . Alcohol Use: No  . Drug Use: No  . Sexual Activity: Not on file   Other Topics Concern  . Not on file   Social History Narrative   Widowed 2013 after 63+ years of marriage, Wife had CHF and multiple myeloma   [name], daughter in Sports coach, lives with patient   Patient does not get regular exercise   No caffeine   Retired from [employer] ([employer]), retired from [employer]     Past Surgical History  Procedure Laterality Date  . Cystoscopy w/ stone manipulation  1950's  . Fem cutaneous nerve entrapment  1977    due to surgery (mild lat anesth of bilat legs)  . Limited pelvis/hip bone scan  04/99    negative  . Bone scan  04/00  . Cataract extraction w/ intraocular lens  implant, bilateral Bilateral ~ 2004  . Melanoma excision Right 06/04    ear; Wide local excision,,with flap  . Stress cardiolite  05/11/04    mild inf. ischemia, EF 71%  . Shoulder surgery Left 06/17/2005    "tore it all to pieces when I fell; not fractured"  . Bronchoscopy  09/08    RML collapse with chronic pneumonia, bx neg (Dr. Joya Gaskins)  . Cyst excision Right 03/07/09    middle finger, DIP Joint Mucoid (Dr. Fredna Dow)  . Eyelid & eyebrow lift  1996  . Cyst excision Right 08/15/09    middle finger, DIP Joint Mucoid Cyst (Dr. Fredna Dow)  . Appendectomy  1950  . Excision of mucoid tumor    . Colonoscopy    . Total knee arthroplasty  01/10/2012    Procedure: TOTAL KNEE ARTHROPLASTY;  Surgeon: Augustin Schooling, MD;  Location: [place];  Service: Orthopedics;  Laterality: Left;  . Surgery scrotal / testicular Left ~ 2000    removal of mass,  noncancerous  . Cardiac catheterization  05/23/04    mild plague-statin  . Coronary angioplasty with stent placement  06/22/2014    to proximal LAD  . Inguinal hernia repair Bilateral 1959  . Inguinal hernia repair Left 1970's  . Esophagogastroduodenoscopy N/A 08/08/2014    Procedure: ESOPHAGOGASTRODUODENOSCOPY (EGD);  Surgeon: Ofilia Neas  Carlean Purl, MD;  Location: [place];  Service: Endoscopy;  Laterality: N/A;  . Colonoscopy N/A 08/08/2014    Procedure: COLONOSCOPY;  Surgeon: Gatha Mayer, MD;  Location: [place];  Service: Endoscopy;  Laterality: N/A;  . Flexible sigmoidoscopy N/A 09/21/2014    Procedure: FLEXIBLE SIGMOIDOSCOPY;  Surgeon: Jerene Bears, MD;  Location: [place];  Service: Gastroenterology;  Laterality: N/A;  . Givens capsule study N/A 09/21/2014    Procedure: GIVENS CAPSULE STUDY;  Surgeon: Jerene Bears, MD;  Location[facility] ENDOSCOPY;  Service: Endoscopy;  Laterality: N/A;  . Left and right heart catheterization with coronary angiogram N/A 06/22/2014    Procedure: LEFT AND RIGHT HEART CATHETERIZATION WITH CORONARY ANGIOGRAM;  Surgeon: Larey Dresser, MD;  Location: [facility] CATH LAB;  Service: Cardiovascular;  Laterality: N/A;  . Percutaneous coronary stent intervention (pci-s)  06/22/2014    Procedure: PERCUTANEOUS CORONARY STENT INTERVENTION (PCI-S);  Surgeon: Larey Dresser, MD;  Location: [facility] CATH LAB;  Service: Cardiovascular;;  . Colonoscopy N/A 01/25/2015    Procedure: COLONOSCOPY;  Surgeon: Gatha Mayer, MD;  Location: [facility] ENDOSCOPY;  Service: Endoscopy;  Laterality: N/A;  . Cystoscopy with biopsy Right 02/03/2015    Procedure: CYSTOSCOPY WITH COLD CUP BLADDER BIOPSY CAUTHERIZAITON OF RIGHT BLADDER CANCER AND SATILITE, TURBT RIGHT BLADDER BASE;  Surgeon: Ailene Rud, MD;  Location: [facility] ORS;  Service: Urology;  Laterality: Right;  . Esophagogastroduodenoscopy N/A 03/03/2015    Procedure: ESOPHAGOGASTRODUODENOSCOPY (EGD);  Surgeon: Jerene Bears, MD;  Location: [facility]  ENDOSCOPY;  Service: Gastroenterology;  Laterality: N/A;  . Gastrojejunostomy N/A 03/09/2015    Procedure: LAPAROSCOPIC GASTROJEJUNOSTOMY;  Surgeon: Excell Seltzer, MD;  Location: [facility] ORS;  Service: General;  Laterality: N/A;  . Portacath placement Right 04/05/2015    Procedure: INSERTION PORT-A-CATH;  Surgeon: Excell Seltzer, MD;  Location: [place];  Service: General;  Laterality: Right;  . Esophagogastroduodenoscopy N/A 04/14/2015    Procedure: ESOPHAGOGASTRODUODENOSCOPY (EGD);  Surgeon: Jerene Bears, MD;  Location: [facility];  Service: Endoscopy;  Laterality: N/A;  bedside case    Family History  Problem Relation Age of Onset  . Thyroid cancer Mother   . Diabetes Father   . Heart disease Father   . Stroke Brother     cerebral hemm  . Colon cancer Brother   . Anesthesia problems Neg Hx   . Hypotension Neg Hx   . Malignant hyperthermia Neg Hx   . Pseudochol deficiency Neg Hx   . Prostate cancer Neg Hx   . Heart attack Father     Allergies  Allergen Reactions  . Sulfa Antibiotics Other (See Comments)    Caused patient have ITP  . Aleve [Naproxen Sodium] Other (See Comments)    Causes platlet drop  . Rofecoxib Other (See Comments)    Celebrex  - affected low platelets  . Sulfamethoxazole-Trimethoprim Other (See Comments)     Causes low platelets and bleeding  . Tape Other (See Comments)    Adhesive tape makes skin tear.  . Tramadol Hcl Other (See Comments)    dizziness, drowsiness  . Neomycin-Bacitracin Zn-Polymyx Rash  . Neosporin [Neomycin-Polymyxin-Gramicidin] Rash  . Penicillins Swelling and Rash    Took dose of amoxicillin 2074m 02/02/15 at dentist office without any complications    Current Outpatient Prescriptions on File Prior to Visit  Medication Sig Dispense Refill  . benzonatate (TESSALON) 200 MG capsule Take 1 capsule (200 mg total) by mouth 3 (three) times daily as needed. 30 capsule 1  .  Cholecalciferol (VITAMIN D3) 2000 UNITS  capsule Take 2,000 Units by mouth every morning.     . dibucaine (NUPERCAINAL) 1 % ointment Apply topically 3 (three) times daily as needed for pain. 30 g 1  . feeding supplement, ENSURE ENLIVE, (ENSURE ENLIVE) LIQD Take 237 mLs by mouth 3 (three) times daily between meals. 237 mL 12  . finasteride (PROSCAR) 5 MG tablet Take 5 mg by mouth daily.    . fluconazole (DIFLUCAN) 100 MG tablet Take 1 tablet (100 mg total) by mouth daily. 5 tablet 0  . furosemide (LASIX) 40 MG tablet Take 0.5 tablets (20 mg total) by mouth daily. 30 tablet 3  . hydrocortisone (ANUSOL-HC) 2.5 % rectal cream Apply topically 2 (two) times daily. (Patient taking differently: Apply topically 2 (two) times daily as needed for hemorrhoids or itching. ) 30 g 0  . levothyroxine (SYNTHROID, LEVOTHROID) 50 MCG tablet Take 1 tablet (50 mcg total) by mouth daily before breakfast. 30 tablet 0  . lidocaine-prilocaine (EMLA) cream Apply 1 application topically as needed. 30 g 0  . LIVALO 4 MG TABS TAKE 1 TABLET AT BEDTIME 90 tablet 3  . loperamide (IMODIUM A-D) 2 MG tablet Take 1 tablet (2 mg total) by mouth 2 (two) times daily as needed for diarrhea or loose stools.    . Multiple Vitamin (MULTIVITAMIN WITH MINERALS) TABS tablet Take 1 tablet by mouth daily.    . nitroGLYCERIN (NITROSTAT) 0.4 MG SL tablet Place 1 tablet (0.4 mg total) under the tongue every 5 (five) minutes as needed. For chest pain 25 tablet 1  . OVER THE COUNTER MEDICATION Place 2 drops into both eyes 4 (four) times daily. Wash type eye drop-    . oxyCODONE-acetaminophen (ROXICET) 5-325 MG per tablet Take 1 tablet by mouth every 4 (four) hours as needed for severe pain. 30 tablet 0  . pantoprazole (PROTONIX) 40 MG tablet TAKE 1 TABLET DAILY (Patient taking differently: TAKE 1 TABLET TWICE DAILY) 90 tablet 2  . POLY-IRON 150 FORTE 150-25-1 MG-MCG-MG CAPS TAKE 1 CAPSULE TWICE A DAY 60 each 2  . pramoxine (PROCTOFOAM) 1 % foam Place 1 application rectally 3 (three) times  daily as needed for itching. 15 g 1  . pramoxine-hydrocortisone (PROCTOCREAM-HC) 1-1 % rectal cream Place 1 application rectally 2 (two) times daily.    . predniSONE (DELTASONE) 5 MG tablet Take 1 tablet (5 mg total) by mouth daily with breakfast. 30 tablet 1  . Probiotic Product (PROBIOTIC & ACIDOPHILUS EX ST PO) Take 1 tablet by mouth every morning.     . prochlorperazine (COMPAZINE) 5 MG tablet Take 1 tablet (5 mg total) by mouth every 6 (six) hours as needed for nausea or vomiting. 30 tablet 0  . Psyllium (METAMUCIL PO) Take 20 mLs by mouth daily as needed (constipation).     Marland Kitchen spironolactone (ALDACTONE) 25 MG tablet Take 0.5 tablets (12.5 mg total) by mouth daily. 60 tablet 2   No current facility-administered medications on file prior to visit.    BP 104/60 mmHg  Pulse 61  Temp(Src) 97.5 F (36.4 C) (Oral)  Ht 6' (1.829 m)  Wt 204 lb (92.534 kg)  BMI 27.66 kg/m2  SpO2 98%    Objective:   Physical Exam  Constitutional: He is oriented to person, place, and time. He does not appear ill.  Neck: Neck supple.  Cardiovascular: Normal rate and regular rhythm.   Pulses:      Dorsalis pedis pulses are 2+ on the right side, and  2+ on the left side.       Posterior tibial pulses are 2+ on the right side, and 2+ on the left side.  Bilateral ankle edema present with moderate tenderness to left ankle. Also left calf swelling with tenderness noted.  Pulmonary/Chest: Effort normal and breath sounds normal. He has no wheezes. He has no rales.  Lymphadenopathy:    He has no cervical adenopathy.  Neurological: He is alert and oriented to person, place, and time.  Skin: Skin is warm and dry.          Assessment & Plan:  Ankle edema:  Present bilaterally. Moderate tenderness to left ankle. Compression hose in place. PT and DP pulses present. Moderate swelling to left calf with some tenderness. Right calf WNL. Will order lower extremity doppler to left to rule out DVT.  Referral made  to his neurologist for sooner appointment for further evaluation of neuropathy to fingers.  Hospital Follow up: Aspiration pneumonia: Improved and overall feels well. Lungs clear. No s/s of systemic infection present. Continue physical therapy. Follow up with Oncology as scheduled.  Need for hospital bed: This patient requires an in home hospital bed due to difficulty repositioning himself, the need for continual elevation of lower extremities during sleep, prevention of bed sores though ability to reposition levels of head and feet. He also requires the gel overlay due to limited mobility, poor circulation, and impaired nutritional status.  I agree.  I saw patient at the [facility].   Need for hospital bed: This patient requires an in home hospital bed due to difficulty repositioning himself, the need for continual elevation of lower extremities during sleep, prevention of bed sores though ability to reposition levels of head and feet. He also requires the gel overlay due to limited mobility, poor circulation, and impaired nutritional status. ---GSD.

## 2015-05-25 NOTE — Telephone Encounter (Signed)
Message from Gaastra with [facility] to follow up on order for hospital bed and trapeze that was faxed. Returned call, informed him order request was received, signed and sent to HIM for faxing and scanning. Requested HIM expedite fax to [facility].

## 2015-05-25 NOTE — Telephone Encounter (Signed)
James [name] called on behalf of the patient. He is expriencing some new issues and they are requesting that Dr. Jannifer Franklin see him within the next week if possible. Please call and advise.

## 2015-05-26 NOTE — Telephone Encounter (Signed)
Form done, please send in.  Thanks.

## 2015-05-26 NOTE — Telephone Encounter (Signed)
Faxed form and recent face to face OV note. [phone] and [phone].

## 2015-05-29 ENCOUNTER — Telehealth: Payer: Self-pay

## 2015-05-29 ENCOUNTER — Ambulatory Visit (INDEPENDENT_AMBULATORY_CARE_PROVIDER_SITE_OTHER): Payer: Medicare Other | Admitting: Neurology

## 2015-05-29 ENCOUNTER — Encounter: Payer: Self-pay | Admitting: Neurology

## 2015-05-29 VITALS — BP 119/57 | HR 61 | Ht 72.0 in | Wt 204.0 lb

## 2015-05-29 DIAGNOSIS — R269 Unspecified abnormalities of gait and mobility: Secondary | ICD-10-CM | POA: Diagnosis not present

## 2015-05-29 DIAGNOSIS — Z9861 Coronary angioplasty status: Secondary | ICD-10-CM | POA: Diagnosis not present

## 2015-05-29 DIAGNOSIS — I2510 Atherosclerotic heart disease of native coronary artery without angina pectoris: Secondary | ICD-10-CM | POA: Diagnosis not present

## 2015-05-29 DIAGNOSIS — E538 Deficiency of other specified B group vitamins: Secondary | ICD-10-CM | POA: Diagnosis not present

## 2015-05-29 DIAGNOSIS — G629 Polyneuropathy, unspecified: Secondary | ICD-10-CM | POA: Diagnosis not present

## 2015-05-29 MED ORDER — DULOXETINE HCL 30 MG PO CPEP: ORAL_CAPSULE | ORAL | Status: AC

## 2015-05-29 NOTE — Progress Notes (Signed)
Reason for visit: Peripheral neuropathy  James [name] is an 81 y.o. male  History of present illness:  [name] is an 81 year old left-handed white male with a history of a peripheral neuropathy. The patient recently has been diagnosed with cancer affecting the duodenum. The patient has metastatic disease to the liver. He has been on chemotherapy that includes leucovorin, 5-FU, and oxaliplatin. Over the last several weeks, he has had significant worsening of his neuropathy discomfort. The patient has a cold sensation and some burning and stinging in the hands. The feet remain numb and tender. He has had increased swelling in both legs, left greater than right. He has a history of a DVT previously in the left leg, and he has had malnutrition with his albumen levels dropping in the 2.0 range. The patient is on oxycodone, but this is not completely improving his pain. He remains ambulatory, walking with a walker. He recently was in the hospital, discharged in late May 2016. The patient has had some problems with urinary incontinence and some diarrhea issues. He comes to the office today for an evaluation.  Past Medical History  Diagnosis Date  . Bradycardia     Hypertensive  . carotid bruit, right   . hypercholesterolemia     Trig 234/293;takes Simcor daily  . Obesity, morbid   . Vasculitis   . 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 Beryle Beams; Dr. Beryle Beams , normal platelets  . Dermatitis     legs  . Bleeding gums   . Chronic low back pain   . Myalgia and myositis   . Unspecified vitamin D deficiency   . Bladder diverticulum   . Primary osteoarthritis of both knees   . Nephrolithiasis   . History of elevated glucose   . Thrombophlebitis     right forearm  . History of BPH   . History of colonic polyps     James [name]  . Epididymitis, left     S/P Epidimectomy  . Splenomegaly 05/15/09    abd U/S NML  . Hypertension, benign     takes  Micardis daily  . Neuropathy     acute  . Bruises easily   . H/O hiatal hernia   . GERD (gastroesophageal reflux disease)     hx of but doesn't take any meds now  . Urinary frequency   . Urinary urgency   . Nocturia   . Urinary leakage   . Abnormality of gait 03/19/2013  . DVT of lower limb, acute 11/19/2013    Peroneal & distal tibial left 11/01/13  . Polio 1941    "mild case"  . CHF (congestive heart failure)     "low grade" (06/22/2014)  . DVT (deep venous thrombosis) 10/2013    LLE  . Pneumonia, bacterial     RML resolved, spirometry, normal  . Pneumonia 1933  . History of blood transfusion 05/2014  . Anemia 05/2014    "related to Xarelto"  . Iron deficiency anemia 05/2014    "got iron infusion"  . Arthritis     "all over"  . Gout   . Malignant melanoma of ear     right  . Thrombophlebitis of superficial veins of right lower extremity 06/23/2014    Incidental finding doppler 05/30/14  . Shortness of breath   . Esophageal varices without mention of bleeding 08/08/2014  . Portal vein thrombosis   . Duodenal cancer     Past Surgical History  Procedure Laterality Date  . Cystoscopy w/ stone manipulation  1950's  . Fem cutaneous nerve entrapment  1977    due to surgery (mild lat anesth of bilat legs)  . Limited pelvis/hip bone scan  04/99    negative  . Bone scan  04/00  . Cataract extraction w/ intraocular lens  implant, bilateral Bilateral ~ 2004  . Melanoma excision Right 06/04    ear; Wide local excision,,with flap  . Stress cardiolite  05/11/04    mild inf. ischemia, EF 71%  . Shoulder surgery Left 06/17/2005    "tore it all to pieces when I fell; not fractured"  . Bronchoscopy  09/08    RML collapse with chronic pneumonia, bx neg (Dr. Joya Gaskins)  . Cyst excision Right 03/07/09    middle finger, DIP Joint Mucoid (Dr. Fredna Dow)  . Eyelid & eyebrow lift  1996  . Cyst excision Right 08/15/09    middle finger, DIP Joint Mucoid Cyst (Dr. Fredna Dow)  . Appendectomy  1950  .  Excision of mucoid tumor    . Colonoscopy    . Total knee arthroplasty  01/10/2012    Procedure: TOTAL KNEE ARTHROPLASTY;  Surgeon: Augustin Schooling, MD;  Location: [place];  Service: Orthopedics;  Laterality: Left;  . Surgery scrotal / testicular Left ~ 2000    removal of mass, noncancerous  . Cardiac catheterization  05/23/04    mild plague-statin  . Coronary angioplasty with stent placement  06/22/2014    to proximal LAD  . Inguinal hernia repair Bilateral 1959  . Inguinal hernia repair Left 1970's  . Esophagogastroduodenoscopy N/A 08/08/2014    Procedure: ESOPHAGOGASTRODUODENOSCOPY (EGD);  Surgeon: Gatha Mayer, MD;  Location: [facility] ENDOSCOPY;  Service: Endoscopy;  Laterality: N/A;  . Colonoscopy N/A 08/08/2014    Procedure: COLONOSCOPY;  Surgeon: Gatha Mayer, MD;  Location: [place];  Service: Endoscopy;  Laterality: N/A;  . Flexible sigmoidoscopy N/A 09/21/2014    Procedure: FLEXIBLE SIGMOIDOSCOPY;  Surgeon: Jerene Bears, MD;  Location: [place];  Service: Gastroenterology;  Laterality: N/A;  . Givens capsule study N/A 09/21/2014    Procedure: GIVENS CAPSULE STUDY;  Surgeon: Jerene Bears, MD;  Location: [facility] ENDOSCOPY;  Service: Endoscopy;  Laterality: N/A;  . Left and right heart catheterization with coronary angiogram N/A 06/22/2014    Procedure: LEFT AND RIGHT HEART CATHETERIZATION WITH CORONARY ANGIOGRAM;  Surgeon: Larey Dresser, MD;  Location: [facility] CATH LAB;  Service: Cardiovascular;  Laterality: N/A;  . Percutaneous coronary stent intervention (pci-s)  06/22/2014    Procedure: PERCUTANEOUS CORONARY STENT INTERVENTION (PCI-S);  Surgeon: Larey Dresser, MD;  Location: [facility] CATH LAB;  Service: Cardiovascular;;  . Colonoscopy N/A 01/25/2015    Procedure: COLONOSCOPY;  Surgeon: Gatha Mayer, MD;  Location: [facility] ENDOSCOPY;  Service: Endoscopy;  Laterality: N/A;  . Cystoscopy with biopsy Right 02/03/2015    Procedure: CYSTOSCOPY WITH COLD CUP BLADDER BIOPSY CAUTHERIZAITON OF RIGHT BLADDER  CANCER AND SATILITE, TURBT RIGHT BLADDER BASE;  Surgeon: Ailene Rud, MD;  Location: [facility] ORS;  Service: Urology;  Laterality: Right;  . Esophagogastroduodenoscopy N/A 03/03/2015    Procedure: ESOPHAGOGASTRODUODENOSCOPY (EGD);  Surgeon: Jerene Bears, MD;  Location: [facility] ENDOSCOPY;  Service: Gastroenterology;  Laterality: N/A;  . Gastrojejunostomy N/A 03/09/2015    Procedure: LAPAROSCOPIC GASTROJEJUNOSTOMY;  Surgeon: Excell Seltzer, MD;  Location: [facility] ORS;  Service: General;  Laterality: N/A;  . Portacath placement Right 04/05/2015    Procedure: INSERTION PORT-A-CATH;  Surgeon: Excell Seltzer, MD;  Location: [facility];  Service: General;  Laterality: Right;  . Esophagogastroduodenoscopy N/A 04/14/2015    Procedure: ESOPHAGOGASTRODUODENOSCOPY (EGD);  Surgeon: Jerene Bears, MD;  Location: [facility] ENDOSCOPY;  Service: Endoscopy;  Laterality: N/A;  bedside case    Family History  Problem Relation Age of Onset  . Thyroid cancer Mother   . Diabetes Father   . Heart disease Father   . Stroke Brother     cerebral hemm  . Colon cancer Brother   . Anesthesia problems Neg Hx   . Hypotension Neg Hx   . Malignant hyperthermia Neg Hx   . Pseudochol deficiency Neg Hx   . Prostate cancer Neg Hx   . Heart attack Father     Social history:  reports that he quit smoking about 47 years ago. His smoking use included Cigarettes and Cigars. He has a 40 pack-year smoking history. He has never used smokeless tobacco. He reports that he does not drink alcohol or use illicit drugs.    Allergies  Allergen Reactions  . Sulfa Antibiotics Other (See Comments)    Caused patient have ITP  . Aleve [Naproxen Sodium] Other (See Comments)    Causes platlet drop  . Rofecoxib Other (See Comments)    Celebrex  - affected low platelets  . Sulfamethoxazole-Trimethoprim Other (See Comments)     Causes low platelets and bleeding  . Tape Other (See Comments)    Adhesive tape makes skin tear.  . Tramadol  Hcl Other (See Comments)    dizziness, drowsiness  . Neomycin-Bacitracin Zn-Polymyx Rash  . Neosporin [Neomycin-Polymyxin-Gramicidin] Rash  . Penicillins Swelling and Rash    Took dose of amoxicillin 2000mg  02/02/15 at dentist office without any complications    Medications:  Prior to Admission medications   Medication Sig Start Date End Date Taking? Authorizing Provider  benzonatate (TESSALON) 200 MG capsule Take 1 capsule (200 mg total) by mouth 3 (three) times daily as needed. 04/24/15  Yes Tonia Ghent, MD  Cholecalciferol (VITAMIN D3) 2000 UNITS capsule Take 2,000 Units by mouth every morning.    Yes Historical Provider, MD  dibucaine (NUPERCAINAL) 1 % ointment Apply topically 3 (three) times daily as needed for pain. 04/24/15  Yes Tonia Ghent, MD  feeding supplement, ENSURE ENLIVE, (ENSURE ENLIVE) LIQD Take 237 mLs by mouth 3 (three) times daily between meals. 03/16/15  Yes Debbe Odea, MD  finasteride (PROSCAR) 5 MG tablet Take 5 mg by mouth daily.   Yes Historical Provider, MD  fluconazole (DIFLUCAN) 100 MG tablet Take 1 tablet (100 mg total) by mouth daily. 05/23/15  Yes Owens Shark, NP  furosemide (LASIX) 40 MG tablet Take 0.5 tablets (20 mg total) by mouth daily. 04/17/15  Yes Oswald Hillock, MD  hydrocortisone (ANUSOL-HC) 2.5 % rectal cream Apply topically 2 (two) times daily. Patient taking differently: Apply topically 2 (two) times daily as needed for hemorrhoids or itching.  04/17/15  Yes Oswald Hillock, MD  levothyroxine (SYNTHROID, LEVOTHROID) 50 MCG tablet Take 1 tablet (50 mcg total) by mouth daily before breakfast. 05/19/15  Yes Donne Hazel, MD  lidocaine-prilocaine (EMLA) cream Apply 1 application topically as needed. 04/06/15  Yes Owens Shark, NP  LIVALO 4 MG TABS TAKE 1 TABLET AT BEDTIME 10/03/14  Yes Jolaine Artist, MD  loperamide (IMODIUM A-D) 2 MG tablet Take 1 tablet (2 mg total) by mouth 2 (two) times daily as needed for diarrhea or loose stools. 04/24/15  Yes  Tonia Ghent, MD  Multiple Vitamin (MULTIVITAMIN WITH MINERALS) TABS tablet Take 1 tablet by mouth daily.   Yes Historical Provider, MD  nitroGLYCERIN (NITROSTAT) 0.4 MG SL tablet Place 1 tablet (0.4 mg total) under the tongue every 5 (five) minutes as needed. For chest pain 05/24/15  Yes Larey Dresser, MD  OVER THE COUNTER MEDICATION Place 2 drops into both eyes 4 (four) times daily. Wash type eye drop-   Yes Historical Provider, MD  oxyCODONE-acetaminophen (ROXICET) 5-325 MG per tablet Take 1 tablet by mouth every 4 (four) hours as needed for severe pain. 03/21/15  Yes Ladell Pier, MD  pantoprazole (PROTONIX) 40 MG tablet TAKE 1 TABLET DAILY Patient taking differently: TAKE 1 TABLET TWICE DAILY 05/08/15  Yes Larey Dresser, MD  POLY-IRON 150 FORTE 850-27-7 MG-MCG-MG CAPS TAKE 1 CAPSULE TWICE A DAY 05/08/15  Yes Larey Dresser, MD  pramoxine (PROCTOFOAM) 1 % foam Place 1 application rectally 3 (three) times daily as needed for itching. 04/24/15  Yes Tonia Ghent, MD  pramoxine-hydrocortisone (PROCTOCREAM-HC) 1-1 % rectal cream Place 1 application rectally 2 (two) times daily.   Yes Historical Provider, MD  predniSONE (DELTASONE) 5 MG tablet Take 1 tablet (5 mg total) by mouth daily with breakfast. 03/31/15  Yes Owens Shark, NP  Probiotic Product (PROBIOTIC & ACIDOPHILUS EX ST PO) Take 1 tablet by mouth every morning.    Yes Historical Provider, MD  prochlorperazine (COMPAZINE) 5 MG tablet Take 1 tablet (5 mg total) by mouth every 6 (six) hours as needed for nausea or vomiting. 03/21/15  Yes Ladell Pier, MD  Psyllium (METAMUCIL PO) Take 20 mLs by mouth daily as needed (constipation).    Yes Historical Provider, MD  spironolactone (ALDACTONE) 25 MG tablet Take 0.5 tablets (12.5 mg total) by mouth daily. 04/17/15  Yes Oswald Hillock, MD  DULoxetine (CYMBALTA) 30 MG capsule One capsule daily for one week, then take one capsule twice a day 05/29/15   Kathrynn Ducking, MD    ROS:  Out of a  complete 14 system review of symptoms, the patient complains only of the following symptoms, and all other reviewed systems are negative.  Decreased activity, decreased appetite, chills, fatigue, decreased weight Difficulty swallowing Blurred vision, loss of vision Shortness of breath Leg swelling Cold intolerance Swollen abdomen, black stools, rectal pain Restless legs, daytime sleepiness, snoring Joint pain, joint swelling, walking difficulty Swollen lymph nodes, bruising easily, anemia Numbness, weakness  Blood pressure 119/57, pulse 61, height 6' (1.829 m), weight 204 lb (92.534 kg).  Physical Exam  General: The patient is alert and cooperative at the time of the examination. The patient is moderately obese.  Skin: 2+ edema is present below the knee on the right, 3+ below the knee on the left.   Neurologic Exam  Mental status: The patient is alert and oriented x 3 at the time of the examination. The patient has apparent normal recent and remote memory, with an apparently normal attention span and concentration ability.   Cranial nerves: Facial symmetry is present. Speech is normal, no aphasia or dysarthria is noted. Extraocular movements are full. Visual fields are full.  Motor: The patient has good strength in all 4 extremities.  Sensory examination: Soft touch sensation is symmetric on the face, arms, and legs. The patient has a stocking pattern pinprick sensory deficit across the knees bilaterally and in the distal third of the forearms bilaterally.  Coordination: The patient has good finger-nose-finger and heel-to-shin bilaterally.  Gait and station:  The patient has a wide-based gait, able to walk with assistance. Romberg is negative. The patient usually uses a walker for ambulation.  Reflexes: Deep tendon reflexes are symmetric, but are depressed.   Assessment/Plan:  1. Peripheral neuropathy  2. Gait disturbance  3. Metastatic cancer  The patient has received  chemotherapy that may have exacerbated his peripheral neuropathy. The patient also has malnutrition, with a low albumen level that may have worsened the peripheral edema. The patient will be sent for blood work today, he will be placed on Cymbalta, and the dose will be increased according to his needs. The patient will contact me if he is not doing well. Otherwise, he will follow-up in 4 months.  Jill Alexanders MD 05/29/2015 6:54 PM  [facility] [address] [place], [place] [address]  Phone [phone] Fax [phone]

## 2015-05-29 NOTE — Telephone Encounter (Signed)
Debbie nurse with [facility] left v/m requesting verbal order for zinc oxide . Pt has reddened areas on buttocks and some areas has first layer of skin broken. Wound care nurse advised to request order for zinc oxide.Please advise.

## 2015-05-29 NOTE — Telephone Encounter (Signed)
Ok to give verbal ok for this. 

## 2015-05-29 NOTE — Patient Instructions (Signed)

## 2015-05-29 NOTE — Telephone Encounter (Signed)
Debbie notified. 

## 2015-05-30 ENCOUNTER — Other Ambulatory Visit: Payer: Self-pay | Admitting: Cardiology

## 2015-05-31 LAB — VITAMIN B12: VITAMIN B 12: 1179 pg/mL — AB (ref 211–946)

## 2015-05-31 LAB — VITAMIN B1: Thiamine: 132.8 nmol/L (ref 66.5–200.0)

## 2015-06-01 ENCOUNTER — Encounter (HOSPITAL_COMMUNITY): Payer: Self-pay | Admitting: Emergency Medicine

## 2015-06-01 ENCOUNTER — Inpatient Hospital Stay (HOSPITAL_COMMUNITY)
Admission: EM | Admit: 2015-06-01 | Discharge: 2015-06-08 | DRG: 356 | Disposition: A | Discharge status: Home Health | Payer: Medicare Other | Attending: Internal Medicine | Admitting: Internal Medicine

## 2015-06-01 ENCOUNTER — Telehealth: Payer: Self-pay | Admitting: *Deleted

## 2015-06-01 ENCOUNTER — Ambulatory Visit: Payer: Self-pay | Admitting: Neurology

## 2015-06-01 DIAGNOSIS — Z87442 Personal history of urinary calculi: Secondary | ICD-10-CM | POA: Diagnosis not present

## 2015-06-01 DIAGNOSIS — M609 Myositis, unspecified: Secondary | ICD-10-CM | POA: Diagnosis present

## 2015-06-01 DIAGNOSIS — C787 Secondary malignant neoplasm of liver and intrahepatic bile duct: Secondary | ICD-10-CM | POA: Diagnosis not present

## 2015-06-01 DIAGNOSIS — I82403 Acute embolism and thrombosis of unspecified deep veins of lower extremity, bilateral: Secondary | ICD-10-CM | POA: Diagnosis present

## 2015-06-01 DIAGNOSIS — Z833 Family history of diabetes mellitus: Secondary | ICD-10-CM | POA: Diagnosis not present

## 2015-06-01 DIAGNOSIS — D630 Anemia in neoplastic disease: Secondary | ICD-10-CM | POA: Diagnosis not present

## 2015-06-01 DIAGNOSIS — Z86010 Personal history of colonic polyps: Secondary | ICD-10-CM | POA: Diagnosis not present

## 2015-06-01 DIAGNOSIS — R17 Unspecified jaundice: Secondary | ICD-10-CM

## 2015-06-01 DIAGNOSIS — T451X5A Adverse effect of antineoplastic and immunosuppressive drugs, initial encounter: Secondary | ICD-10-CM | POA: Diagnosis present

## 2015-06-01 DIAGNOSIS — I8500 Esophageal varices without bleeding: Secondary | ICD-10-CM | POA: Diagnosis present

## 2015-06-01 DIAGNOSIS — N400 Enlarged prostate without lower urinary tract symptoms: Secondary | ICD-10-CM | POA: Diagnosis present

## 2015-06-01 DIAGNOSIS — Z8672 Personal history of thrombophlebitis: Secondary | ICD-10-CM

## 2015-06-01 DIAGNOSIS — E780 Pure hypercholesterolemia: Secondary | ICD-10-CM | POA: Diagnosis present

## 2015-06-01 DIAGNOSIS — Z66 Do not resuscitate: Secondary | ICD-10-CM | POA: Diagnosis not present

## 2015-06-01 DIAGNOSIS — Z955 Presence of coronary angioplasty implant and graft: Secondary | ICD-10-CM

## 2015-06-01 DIAGNOSIS — Z823 Family history of stroke: Secondary | ICD-10-CM | POA: Diagnosis not present

## 2015-06-01 DIAGNOSIS — K7290 Hepatic failure, unspecified without coma: Secondary | ICD-10-CM | POA: Diagnosis present

## 2015-06-01 DIAGNOSIS — E871 Hypo-osmolality and hyponatremia: Secondary | ICD-10-CM | POA: Diagnosis not present

## 2015-06-01 DIAGNOSIS — Z79891 Long term (current) use of opiate analgesic: Secondary | ICD-10-CM

## 2015-06-01 DIAGNOSIS — Z8701 Personal history of pneumonia (recurrent): Secondary | ICD-10-CM | POA: Diagnosis not present

## 2015-06-01 DIAGNOSIS — K922 Gastrointestinal hemorrhage, unspecified: Secondary | ICD-10-CM | POA: Diagnosis not present

## 2015-06-01 DIAGNOSIS — Z961 Presence of intraocular lens: Secondary | ICD-10-CM | POA: Diagnosis present

## 2015-06-01 DIAGNOSIS — Z808 Family history of malignant neoplasm of other organs or systems: Secondary | ICD-10-CM | POA: Diagnosis not present

## 2015-06-01 DIAGNOSIS — M170 Bilateral primary osteoarthritis of knee: Secondary | ICD-10-CM | POA: Diagnosis present

## 2015-06-01 DIAGNOSIS — I81 Portal vein thrombosis: Secondary | ICD-10-CM | POA: Diagnosis present

## 2015-06-01 DIAGNOSIS — I10 Essential (primary) hypertension: Secondary | ICD-10-CM | POA: Diagnosis present

## 2015-06-01 DIAGNOSIS — M545 Low back pain: Secondary | ICD-10-CM | POA: Diagnosis present

## 2015-06-01 DIAGNOSIS — Z79899 Other long term (current) drug therapy: Secondary | ICD-10-CM | POA: Diagnosis not present

## 2015-06-01 DIAGNOSIS — Z85820 Personal history of malignant melanoma of skin: Secondary | ICD-10-CM | POA: Diagnosis not present

## 2015-06-01 DIAGNOSIS — Z800 Family history of malignant neoplasm of digestive organs: Secondary | ICD-10-CM | POA: Diagnosis not present

## 2015-06-01 DIAGNOSIS — Z9109 Other allergy status, other than to drugs and biological substances: Secondary | ICD-10-CM

## 2015-06-01 DIAGNOSIS — I2699 Other pulmonary embolism without acute cor pulmonale: Secondary | ICD-10-CM | POA: Diagnosis present

## 2015-06-01 DIAGNOSIS — K920 Hematemesis: Secondary | ICD-10-CM | POA: Diagnosis present

## 2015-06-01 DIAGNOSIS — Z6827 Body mass index (BMI) 27.0-27.9, adult: Secondary | ICD-10-CM

## 2015-06-01 DIAGNOSIS — I2510 Atherosclerotic heart disease of native coronary artery without angina pectoris: Secondary | ICD-10-CM | POA: Diagnosis present

## 2015-06-01 DIAGNOSIS — Z9842 Cataract extraction status, left eye: Secondary | ICD-10-CM | POA: Diagnosis not present

## 2015-06-01 DIAGNOSIS — Z7952 Long term (current) use of systemic steroids: Secondary | ICD-10-CM | POA: Diagnosis not present

## 2015-06-01 DIAGNOSIS — K921 Melena: Secondary | ICD-10-CM | POA: Diagnosis present

## 2015-06-01 DIAGNOSIS — K311 Adult hypertrophic pyloric stenosis: Secondary | ICD-10-CM | POA: Diagnosis present

## 2015-06-01 DIAGNOSIS — E785 Hyperlipidemia, unspecified: Secondary | ICD-10-CM | POA: Diagnosis present

## 2015-06-01 DIAGNOSIS — D693 Immune thrombocytopenic purpura: Secondary | ICD-10-CM | POA: Diagnosis present

## 2015-06-01 DIAGNOSIS — Z880 Allergy status to penicillin: Secondary | ICD-10-CM

## 2015-06-01 DIAGNOSIS — Z87891 Personal history of nicotine dependence: Secondary | ICD-10-CM

## 2015-06-01 DIAGNOSIS — G8929 Other chronic pain: Secondary | ICD-10-CM | POA: Diagnosis present

## 2015-06-01 DIAGNOSIS — D62 Acute posthemorrhagic anemia: Secondary | ICD-10-CM | POA: Diagnosis present

## 2015-06-01 DIAGNOSIS — Z886 Allergy status to analgesic agent status: Secondary | ICD-10-CM

## 2015-06-01 DIAGNOSIS — R945 Abnormal results of liver function studies: Secondary | ICD-10-CM

## 2015-06-01 DIAGNOSIS — R188 Other ascites: Secondary | ICD-10-CM | POA: Diagnosis present

## 2015-06-01 DIAGNOSIS — K644 Residual hemorrhoidal skin tags: Secondary | ICD-10-CM | POA: Diagnosis present

## 2015-06-01 DIAGNOSIS — Z882 Allergy status to sulfonamides status: Secondary | ICD-10-CM

## 2015-06-01 DIAGNOSIS — O2230 Deep phlebothrombosis in pregnancy, unspecified trimester: Secondary | ICD-10-CM

## 2015-06-01 DIAGNOSIS — K315 Obstruction of duodenum: Secondary | ICD-10-CM | POA: Diagnosis present

## 2015-06-01 DIAGNOSIS — R609 Edema, unspecified: Secondary | ICD-10-CM

## 2015-06-01 DIAGNOSIS — Z86718 Personal history of other venous thrombosis and embolism: Secondary | ICD-10-CM

## 2015-06-01 DIAGNOSIS — R4182 Altered mental status, unspecified: Secondary | ICD-10-CM | POA: Diagnosis present

## 2015-06-01 DIAGNOSIS — I5032 Chronic diastolic (congestive) heart failure: Secondary | ICD-10-CM | POA: Diagnosis present

## 2015-06-01 DIAGNOSIS — Z515 Encounter for palliative care: Secondary | ICD-10-CM

## 2015-06-01 DIAGNOSIS — I82402 Acute embolism and thrombosis of unspecified deep veins of left lower extremity: Secondary | ICD-10-CM | POA: Diagnosis not present

## 2015-06-01 DIAGNOSIS — Z9841 Cataract extraction status, right eye: Secondary | ICD-10-CM | POA: Diagnosis not present

## 2015-06-01 DIAGNOSIS — E559 Vitamin D deficiency, unspecified: Secondary | ICD-10-CM | POA: Diagnosis present

## 2015-06-01 DIAGNOSIS — R531 Weakness: Secondary | ICD-10-CM

## 2015-06-01 DIAGNOSIS — Z6828 Body mass index (BMI) 28.0-28.9, adult: Secondary | ICD-10-CM | POA: Diagnosis not present

## 2015-06-01 DIAGNOSIS — K831 Obstruction of bile duct: Secondary | ICD-10-CM | POA: Diagnosis present

## 2015-06-01 DIAGNOSIS — Z888 Allergy status to other drugs, medicaments and biological substances status: Secondary | ICD-10-CM | POA: Diagnosis not present

## 2015-06-01 DIAGNOSIS — R7989 Other specified abnormal findings of blood chemistry: Secondary | ICD-10-CM

## 2015-06-01 DIAGNOSIS — D509 Iron deficiency anemia, unspecified: Secondary | ICD-10-CM | POA: Diagnosis present

## 2015-06-01 DIAGNOSIS — Z85068 Personal history of other malignant neoplasm of small intestine: Secondary | ICD-10-CM

## 2015-06-01 DIAGNOSIS — M109 Gout, unspecified: Secondary | ICD-10-CM | POA: Diagnosis present

## 2015-06-01 DIAGNOSIS — Z8249 Family history of ischemic heart disease and other diseases of the circulatory system: Secondary | ICD-10-CM

## 2015-06-01 DIAGNOSIS — K219 Gastro-esophageal reflux disease without esophagitis: Secondary | ICD-10-CM | POA: Diagnosis present

## 2015-06-01 DIAGNOSIS — D6481 Anemia due to antineoplastic chemotherapy: Secondary | ICD-10-CM | POA: Diagnosis present

## 2015-06-01 DIAGNOSIS — G629 Polyneuropathy, unspecified: Secondary | ICD-10-CM | POA: Diagnosis present

## 2015-06-01 DIAGNOSIS — D649 Anemia, unspecified: Secondary | ICD-10-CM

## 2015-06-01 DIAGNOSIS — R591 Generalized enlarged lymph nodes: Secondary | ICD-10-CM | POA: Diagnosis present

## 2015-06-01 DIAGNOSIS — K274 Chronic or unspecified peptic ulcer, site unspecified, with hemorrhage: Secondary | ICD-10-CM | POA: Diagnosis not present

## 2015-06-01 DIAGNOSIS — D638 Anemia in other chronic diseases classified elsewhere: Secondary | ICD-10-CM | POA: Diagnosis present

## 2015-06-01 DIAGNOSIS — E039 Hypothyroidism, unspecified: Secondary | ICD-10-CM | POA: Diagnosis present

## 2015-06-01 DIAGNOSIS — Z885 Allergy status to narcotic agent status: Secondary | ICD-10-CM | POA: Diagnosis not present

## 2015-06-01 DIAGNOSIS — C170 Malignant neoplasm of duodenum: Principal | ICD-10-CM

## 2015-06-01 LAB — COMPREHENSIVE METABOLIC PANEL
ALBUMIN: 2.5 g/dL — AB (ref 3.5–5.0)
ALK PHOS: 380 U/L — AB (ref 38–126)
ALT: 70 U/L — AB (ref 17–63)
ANION GAP: 10 (ref 5–15)
AST: 61 U/L — ABNORMAL HIGH (ref 15–41)
BUN: 29 mg/dL — AB (ref 6–20)
CALCIUM: 8 mg/dL — AB (ref 8.9–10.3)
CO2: 20 mmol/L — ABNORMAL LOW (ref 22–32)
Chloride: 102 mmol/L (ref 101–111)
Creatinine, Ser: 0.89 mg/dL (ref 0.61–1.24)
GFR calc Af Amer: >60 mL/min (ref >60)
GFR calc non Af Amer: >60 mL/min (ref >60)
Glucose, Bld: 155 mg/dL — ABNORMAL HIGH (ref 65–99)
POTASSIUM: 4.8 mmol/L (ref 3.5–5.1)
Sodium: 132 mmol/L — ABNORMAL LOW (ref 135–145)
Total Bilirubin: 1.9 mg/dL — ABNORMAL HIGH (ref 0.3–1.2)
Total Protein: 5.2 g/dL — ABNORMAL LOW (ref 6.5–8.1)

## 2015-06-01 LAB — PREPARE RBC (CROSSMATCH)

## 2015-06-01 LAB — GLUCOSE, CAPILLARY: Glucose-Capillary: 169 mg/dL — ABNORMAL HIGH (ref 65–99)

## 2015-06-01 LAB — CBC
HCT: 21.3 % — ABNORMAL LOW (ref 39.0–52.0)
Hemoglobin: 6.6 g/dL — CL (ref 13.0–17.0)
MCH: 29.2 pg (ref 26.0–34.0)
MCHC: 31 g/dL (ref 30.0–36.0)
MCV: 94.2 fL (ref 78.0–100.0)
PLATELETS: 132 10*3/uL — AB (ref 150–400)
RBC: 2.26 MIL/uL — ABNORMAL LOW (ref 4.22–5.81)
RDW: 24.2 % — ABNORMAL HIGH (ref 11.5–15.5)
WBC: 12.1 10*3/uL — ABNORMAL HIGH (ref 4.0–10.5)

## 2015-06-01 LAB — POC OCCULT BLOOD, ED: Fecal Occult Bld: POSITIVE — AB

## 2015-06-01 MED ORDER — SODIUM CHLORIDE 0.9 % IV SOLN
50.0000 ug/h | INTRAVENOUS | Status: DC
  Administered 2015-06-01 – 2015-06-02 (×2): 50 ug/h via INTRAVENOUS
  Filled 2015-06-01 (×4): qty 1

## 2015-06-01 MED ORDER — ONDANSETRON HCL 4 MG PO TABS: 4.0000 mg | ORAL_TABLET | Freq: Four times a day (QID) | ORAL | Status: DC | PRN

## 2015-06-01 MED ORDER — PRAVASTATIN SODIUM 80 MG PO TABS
80.0000 mg | ORAL_TABLET | Freq: Every day | ORAL | Status: DC
  Administered 2015-06-01 – 2015-06-05 (×5): 80 mg via ORAL
  Filled 2015-06-01: qty 1
  Filled 2015-06-01 (×2): qty 4
  Filled 2015-06-01 (×3): qty 1
  Filled 2015-06-01: qty 4

## 2015-06-01 MED ORDER — VITAMIN B-1 100 MG PO TABS
100.0000 mg | ORAL_TABLET | Freq: Every day | ORAL | Status: DC
  Administered 2015-06-01 – 2015-06-06 (×6): 100 mg via ORAL
  Filled 2015-06-01 (×6): qty 1

## 2015-06-01 MED ORDER — SODIUM CHLORIDE 0.9 % IV SOLN
Freq: Once | INTRAVENOUS | Status: AC
  Administered 2015-06-01: 21:00:00 via INTRAVENOUS

## 2015-06-01 MED ORDER — DULOXETINE HCL 30 MG PO CPEP
30.0000 mg | ORAL_CAPSULE | Freq: Two times a day (BID) | ORAL | Status: DC
  Administered 2015-06-01 – 2015-06-08 (×13): 30 mg via ORAL
  Filled 2015-06-01 (×14): qty 1

## 2015-06-01 MED ORDER — ONDANSETRON HCL 4 MG/2ML IJ SOLN: 4.0000 mg | Freq: Four times a day (QID) | INTRAMUSCULAR | Status: DC | PRN

## 2015-06-01 MED ORDER — SACCHAROMYCES BOULARDII 250 MG PO CAPS
250.0000 mg | ORAL_CAPSULE | Freq: Every day | ORAL | Status: DC
  Administered 2015-06-02 – 2015-06-08 (×7): 250 mg via ORAL
  Filled 2015-06-01 (×8): qty 1

## 2015-06-01 MED ORDER — IRON POLYSACCH CMPLX-B12-FA 150-0.025-1 MG PO CAPS: 1.0000 | ORAL_CAPSULE | Freq: Two times a day (BID) | ORAL | Status: DC

## 2015-06-01 MED ORDER — SODIUM CHLORIDE 0.9 % IV SOLN: Freq: Once | INTRAVENOUS | Status: DC

## 2015-06-01 MED ORDER — DIBUCAINE 1 % EX OINT: TOPICAL_OINTMENT | Freq: Three times a day (TID) | CUTANEOUS | Status: DC | PRN

## 2015-06-01 MED ORDER — SODIUM CHLORIDE 0.9 % IV SOLN
INTRAVENOUS | Status: DC
Start: 2015-06-01 — End: 2015-06-08
  Administered 2015-06-01 – 2015-06-07 (×6): via INTRAVENOUS

## 2015-06-01 MED ORDER — PREDNISONE 10 MG PO TABS: 10.0000 mg | ORAL_TABLET | Freq: Every day | ORAL | Status: DC

## 2015-06-01 MED ORDER — PREDNISONE 20 MG PO TABS
20.0000 mg | ORAL_TABLET | Freq: Every day | ORAL | Status: AC
  Administered 2015-06-02: 20 mg via ORAL
  Filled 2015-06-01: qty 1

## 2015-06-01 MED ORDER — CHLORHEXIDINE GLUCONATE 0.12 % MT SOLN
15.0000 mL | Freq: Two times a day (BID) | OROMUCOSAL | Status: DC
  Administered 2015-06-02 – 2015-06-08 (×13): 15 mL via OROMUCOSAL
  Filled 2015-06-01 (×13): qty 15

## 2015-06-01 MED ORDER — PROBIOTIC & ACIDOPHILUS EX ST PO CAPS: ORAL_CAPSULE | ORAL | Status: DC

## 2015-06-01 MED ORDER — FUROSEMIDE 10 MG/ML IJ SOLN
20.0000 mg | Freq: Once | INTRAMUSCULAR | Status: AC
  Administered 2015-06-01: 20 mg via INTRAVENOUS
  Filled 2015-06-01: qty 2

## 2015-06-01 MED ORDER — FOLIC ACID 1 MG PO TABS
1.0000 mg | ORAL_TABLET | Freq: Every day | ORAL | Status: DC
  Administered 2015-06-01 – 2015-06-06 (×6): 1 mg via ORAL
  Filled 2015-06-01 (×6): qty 1

## 2015-06-01 MED ORDER — ADULT MULTIVITAMIN W/MINERALS CH
1.0000 | ORAL_TABLET | Freq: Every day | ORAL | Status: DC
  Administered 2015-06-01 – 2015-06-06 (×6): 1 via ORAL
  Filled 2015-06-01 (×7): qty 1

## 2015-06-01 MED ORDER — SODIUM CHLORIDE 0.9 % IV SOLN
8.0000 mg/h | INTRAVENOUS | Status: DC
  Administered 2015-06-01 – 2015-06-02 (×2): 8 mg/h via INTRAVENOUS
  Filled 2015-06-01 (×4): qty 80

## 2015-06-01 MED ORDER — OXYCODONE-ACETAMINOPHEN 5-325 MG PO TABS: 1.0000 | ORAL_TABLET | ORAL | Status: DC | PRN

## 2015-06-01 MED ORDER — HYDROCORTISONE ACE-PRAMOXINE 1-1 % RE FOAM
1.0000 | Freq: Two times a day (BID) | RECTAL | Status: DC
  Administered 2015-06-01 – 2015-06-07 (×9): 1 via RECTAL
  Filled 2015-06-01: qty 10

## 2015-06-01 MED ORDER — PREDNISONE 10 MG PO TABS: 5.0000 mg | ORAL_TABLET | Freq: Every day | ORAL | Status: DC

## 2015-06-01 MED ORDER — FLUCONAZOLE 100 MG PO TABS: 100.0000 mg | ORAL_TABLET | Freq: Every day | ORAL | Status: DC

## 2015-06-01 MED ORDER — FINASTERIDE 5 MG PO TABS
5.0000 mg | ORAL_TABLET | Freq: Every day | ORAL | Status: DC
  Administered 2015-06-02 – 2015-06-08 (×7): 5 mg via ORAL
  Filled 2015-06-01 (×7): qty 1

## 2015-06-01 MED ORDER — OCTREOTIDE LOAD VIA INFUSION
50.0000 ug | Freq: Once | INTRAVENOUS | Status: DC
  Filled 2015-06-01: qty 25

## 2015-06-01 MED ORDER — ENSURE ENLIVE PO LIQD
237.0000 mL | Freq: Three times a day (TID) | ORAL | Status: DC
  Administered 2015-06-02 – 2015-06-08 (×9): 237 mL via ORAL

## 2015-06-01 MED ORDER — DIBUCAINE 1 % RE OINT
TOPICAL_OINTMENT | RECTAL | Status: DC | PRN
  Administered 2015-06-03 (×2): 1 via RECTAL
  Filled 2015-06-01: qty 28

## 2015-06-01 MED ORDER — SPIRONOLACTONE 12.5 MG HALF TABLET
12.5000 mg | ORAL_TABLET | Freq: Every day | ORAL | Status: DC
  Administered 2015-06-02 – 2015-06-06 (×5): 12.5 mg via ORAL
  Filled 2015-06-01 (×5): qty 1

## 2015-06-01 MED ORDER — SODIUM CHLORIDE 0.9 % IJ SOLN
3.0000 mL | Freq: Two times a day (BID) | INTRAMUSCULAR | Status: DC
  Administered 2015-06-01 – 2015-06-03 (×5): 3 mL via INTRAVENOUS

## 2015-06-01 MED ORDER — VITAMIN D 1000 UNITS PO TABS
2000.0000 [IU] | ORAL_TABLET | Freq: Every day | ORAL | Status: DC
  Administered 2015-06-02 – 2015-06-06 (×5): 2000 [IU] via ORAL
  Filled 2015-06-01 (×5): qty 2

## 2015-06-01 MED ORDER — LEVOTHYROXINE SODIUM 50 MCG PO TABS
50.0000 ug | ORAL_TABLET | Freq: Every day | ORAL | Status: DC
  Administered 2015-06-02 – 2015-06-06 (×5): 50 ug via ORAL
  Filled 2015-06-01 (×5): qty 1

## 2015-06-01 MED ORDER — NITROGLYCERIN 0.4 MG SL SUBL: 0.4000 mg | SUBLINGUAL_TABLET | SUBLINGUAL | Status: DC | PRN

## 2015-06-01 MED ORDER — PANTOPRAZOLE SODIUM 40 MG IV SOLR
40.0000 mg | Freq: Two times a day (BID) | INTRAVENOUS | Status: DC
  Administered 2015-06-04 – 2015-06-07 (×6): 40 mg via INTRAVENOUS
  Filled 2015-06-01 (×6): qty 40

## 2015-06-01 MED ORDER — POLYSACCHARIDE IRON COMPLEX 150 MG PO CAPS
150.0000 mg | ORAL_CAPSULE | Freq: Two times a day (BID) | ORAL | Status: DC
  Administered 2015-06-01 – 2015-06-06 (×10): 150 mg via ORAL
  Filled 2015-06-01 (×10): qty 1

## 2015-06-01 MED ORDER — FUROSEMIDE 20 MG PO TABS
20.0000 mg | ORAL_TABLET | Freq: Every day | ORAL | Status: DC
Start: 2015-06-02 — End: 2015-06-06
  Administered 2015-06-02 – 2015-06-06 (×5): 20 mg via ORAL
  Filled 2015-06-01 (×5): qty 1

## 2015-06-01 MED ORDER — SODIUM CHLORIDE 0.9 % IV SOLN
80.0000 mg | Freq: Once | INTRAVENOUS | Status: AC
  Administered 2015-06-01: 80 mg via INTRAVENOUS
  Filled 2015-06-01: qty 80

## 2015-06-01 MED ORDER — BENZONATATE 100 MG PO CAPS: 200.0000 mg | ORAL_CAPSULE | ORAL | Status: DC | PRN

## 2015-06-01 MED ORDER — CETYLPYRIDINIUM CHLORIDE 0.05 % MT LIQD
7.0000 mL | Freq: Two times a day (BID) | OROMUCOSAL | Status: DC
  Administered 2015-06-02 – 2015-06-07 (×8): 7 mL via OROMUCOSAL

## 2015-06-01 MED ORDER — INSULIN ASPART 100 UNIT/ML ~~LOC~~ SOLN
0.0000 [IU] | Freq: Three times a day (TID) | SUBCUTANEOUS | Status: DC
  Administered 2015-06-02: 3 [IU] via SUBCUTANEOUS
  Administered 2015-06-04 – 2015-06-06 (×2): 2 [IU] via SUBCUTANEOUS

## 2015-06-01 MED ORDER — HYDROCORTISONE ACE-PRAMOXINE 1-1 % RE CREA
1.0000 "application " | TOPICAL_CREAM | Freq: Two times a day (BID) | RECTAL | Status: DC
  Filled 2015-06-01: qty 30

## 2015-06-01 NOTE — ED Notes (Signed)
Nurse currently accessing port 

## 2015-06-01 NOTE — Telephone Encounter (Signed)
PT. IS GETTING WEAK EACH DAY. VERBAL ORDER AND READ BACK TO LISA THOMAS,NP- PT. NEEDS TO GO TO THE EMERGENCY DEPARTMENT FOR EVALUATION. NOTIFIED PT. HE VOICES UNDERSTANDING.

## 2015-06-01 NOTE — ED Notes (Signed)
Pt has been c/o weakness, having black tarry stool started last night, had an episode today. [facility] wanted pt to have hgb checked, has not had blood transfusion in a while. Pt also started on Cymbalta due to neuropathy. Denies pain.

## 2015-06-01 NOTE — H&P (Signed)
[facility] History and Physical  James [name] ZOX:[id number] DOB: Aug 18, 1928 [name]: 06/01/2015  Referring physician: Noemi Chapel, MD PCP: [name], MD   Chief Complaint: GI Bleed  HPI: James [name] is a 81 y.o. male with history of hypertension hyperlipidemia chronic anemia small bowel cancer esophageal varices presents with GI bleed. He had a duodenal tumor which is being followed by oncology. Patient also has a history of portal vein clot possibly. He had a PET scan done which shows multiple nodal involvement and also with second portion of the duodenum involved suggestive of adenocarcinoma. Patient has had prior need for transfusion and his last Hgb was in the 8.4 range. Patient started on cymbalta recently and yesterday noted to have black tarry looking stools. The daughter thought perhaps it was the cymbalta. Patient called the oncology clinic and was told to come to the ED. Here in the ED his Hgb was noted to be 6.6 gm. He has no SOB noted he has no chest pain but he has noted that he has gotten weaker over the last 2 days. In addition he was not able to tolerate physical therapy. He has had no dizziness but he does feel light headed when he stands.   Review of Systems:  Constitutional:  +weight loss, night sweats, +chills, +fatigue.  HEENT:  No headaches, itching, ear ache, nasal congestion, post nasal drip,  Cardio-vascular:  No chest pain, +swelling in lower extremities +dizziness  GI:  No heartburn, indigestion, abdominal pain, nausea, vomiting  Resp:  No shortness of breath No coughing up of blood.  Skin:  no rash or lesions.  GU:  no dysuria, change in color of urine Musculoskeletal:  No joint pain or swelling. No decreased range of motion Psych:  No change in mood or affect. No depression or anxiety. No memory loss.   Past Medical History  Diagnosis Date  . Bradycardia     Hypertensive  . carotid bruit, right   . hypercholesterolemia     Trig  234/293;takes Simcor daily  . Obesity, morbid   . Vasculitis   . 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 Beryle Beams; Dr. Beryle Beams , normal platelets  . Dermatitis     legs  . Bleeding gums   . Chronic low back pain   . Myalgia and myositis   . Unspecified vitamin D deficiency   . Bladder diverticulum   . Primary osteoarthritis of both knees   . Nephrolithiasis   . History of elevated glucose   . Thrombophlebitis     right forearm  . History of BPH   . History of colonic polyps     [name]  . Epididymitis, left     S/P Epidimectomy  . Splenomegaly 05/15/09    abd U/S NML  . Hypertension, benign     takes Micardis daily  . Neuropathy     acute  . Bruises easily   . H/O hiatal hernia   . GERD (gastroesophageal reflux disease)     hx of but doesn't take any meds now  . Urinary frequency   . Urinary urgency   . Nocturia   . Urinary leakage   . Abnormality of gait 03/19/2013  . DVT of lower limb, acute 11/19/2013    Peroneal & distal tibial left 11/01/13  . Polio 1941    "mild case"  . CHF (congestive heart failure)     "low grade" (06/22/2014)  . DVT (deep venous thrombosis) 10/2013  LLE  . Pneumonia, bacterial     RML resolved, spirometry, normal  . Pneumonia 1933  . History of blood transfusion 05/2014  . Anemia 05/2014    "related to Xarelto"  . Iron deficiency anemia 05/2014    "got iron infusion"  . Arthritis     "all over"  . Gout   . Malignant melanoma of ear     right  . Thrombophlebitis of superficial veins of right lower extremity 06/23/2014    Incidental finding doppler 05/30/14  . Shortness of breath   . Esophageal varices without mention of bleeding 08/08/2014  . Portal vein thrombosis   . Duodenal cancer    Past Surgical History  Procedure Laterality Date  . Cystoscopy w/ stone manipulation  1950's  . Fem cutaneous nerve entrapment  1977    due to surgery (mild lat anesth of bilat legs)  . Limited  pelvis/hip bone scan  04/99    negative  . Bone scan  04/00  . Cataract extraction w/ intraocular lens  implant, bilateral Bilateral ~ 2004  . Melanoma excision Right 06/04    ear; Wide local excision,,with flap  . Stress cardiolite  05/11/04    mild inf. ischemia, EF 71%  . Shoulder surgery Left 06/17/2005    "tore it all to pieces when I fell; not fractured"  . Bronchoscopy  09/08    RML collapse with chronic pneumonia, bx neg (Dr. Joya Gaskins)  . Cyst excision Right 03/07/09    middle finger, DIP Joint Mucoid (Dr. Fredna Dow)  . Eyelid & eyebrow lift  1996  . Cyst excision Right 08/15/09    middle finger, DIP Joint Mucoid Cyst (Dr. Fredna Dow)  . Appendectomy  1950  . Excision of mucoid tumor    . Colonoscopy    . Total knee arthroplasty  01/10/2012    Procedure: TOTAL KNEE ARTHROPLASTY;  Surgeon: Augustin Schooling, MD;  Location: [place];  Service: Orthopedics;  Laterality: Left;  . Surgery scrotal / testicular Left ~ 2000    removal of mass, noncancerous  . Cardiac catheterization  05/23/04    mild plague-statin  . Coronary angioplasty with stent placement  06/22/2014    to proximal LAD  . Inguinal hernia repair Bilateral 1959  . Inguinal hernia repair Left 1970's  . Esophagogastroduodenoscopy N/A 08/08/2014    Procedure: ESOPHAGOGASTRODUODENOSCOPY (EGD);  Surgeon: Gatha Mayer, MD;  Location: [facility] ENDOSCOPY;  Service: Endoscopy;  Laterality: N/A;  . Colonoscopy N/A 08/08/2014    Procedure: COLONOSCOPY;  Surgeon: Gatha Mayer, MD;  Location: [place];  Service: Endoscopy;  Laterality: N/A;  . Flexible sigmoidoscopy N/A 09/21/2014    Procedure: FLEXIBLE SIGMOIDOSCOPY;  Surgeon: Jerene Bears, MD;  Location: [place];  Service: Gastroenterology;  Laterality: N/A;  . Givens capsule study N/A 09/21/2014    Procedure: GIVENS CAPSULE STUDY;  Surgeon: Jerene Bears, MD;  Location: [facility] ENDOSCOPY;  Service: Endoscopy;  Laterality: N/A;  . Left and right heart catheterization with coronary angiogram  N/A 06/22/2014    Procedure: LEFT AND RIGHT HEART CATHETERIZATION WITH CORONARY ANGIOGRAM;  Surgeon: Larey Dresser, MD;  Location: [facility] CATH LAB;  Service: Cardiovascular;  Laterality: N/A;  . Percutaneous coronary stent intervention (pci-s)  06/22/2014    Procedure: PERCUTANEOUS CORONARY STENT INTERVENTION (PCI-S);  Surgeon: Larey Dresser, MD;  Location: [facility] CATH LAB;  Service: Cardiovascular;;  . Colonoscopy N/A 01/25/2015    Procedure: COLONOSCOPY;  Surgeon: Gatha Mayer, MD;  Location: [facility] ENDOSCOPY;  Service: Endoscopy;  Laterality: N/A;  . Cystoscopy with biopsy Right 02/03/2015    Procedure: CYSTOSCOPY WITH COLD CUP BLADDER BIOPSY CAUTHERIZAITON OF RIGHT BLADDER CANCER AND SATILITE, TURBT RIGHT BLADDER BASE;  Surgeon: Ailene Rud, MD;  Location: [facility] ORS;  Service: Urology;  Laterality: Right;  . Esophagogastroduodenoscopy N/A 03/03/2015    Procedure: ESOPHAGOGASTRODUODENOSCOPY (EGD);  Surgeon: Jerene Bears, MD;  Location: [facility] ENDOS[facility];  Service: Gastroenterology;  Laterality: N/A;  . Gastrojejunostomy N/A 03/09/2015    Procedure: LAPAROSCOPIC GASTROJEJUNOSTOMY;  Surgeon: Excell Seltzer, MD;  Location: [facility];  Service: General;  Laterality: N/A;  . Portacath placement Right 04/05/2015    Procedure: INSERTION PORT-A-CATH;  Surgeon: Excell Seltzer, MD;  Location: [place];  Service: General;  Laterality: Right;  . Esophagogastroduodenoscopy N/A 04/14/2015    Procedure: ESOPHAGOGASTRODUODENOSCOPY (EGD);  Surgeon: Jerene Bears, MD;  Location: [facility] ENDOSCOPY;  Service: Endoscopy;  Laterality: N/A;  bedside case   Social History:  reports that he quit smoking about 47 years ago. His smoking use included Cigarettes and Cigars. He has a 40 pack-year smoking history. He has never used smokeless tobacco. He reports that he does not drink alcohol or use illicit drugs.  Allergies  Allergen Reactions  . Sulfa Antibiotics Other (See Comments)    Caused patient have ITP  . Aleve  [Naproxen Sodium] Other (See Comments)    Causes platlet drop  . Rofecoxib Other (See Comments)    Celebrex  - affected low platelets  . Sulfamethoxazole-Trimethoprim Other (See Comments)     Causes low platelets and bleeding  . Tape Other (See Comments)    Adhesive tape makes skin tear.  . Tramadol Hcl Other (See Comments)    dizziness, drowsiness  . Neomycin-Bacitracin Zn-Polymyx Rash  . Neosporin [Neomycin-Polymyxin-Gramicidin] Rash  . Penicillins Swelling and Rash    Took dose of amoxicillin 2000mg  02/02/15 at dentist office without any complications    Family History  Problem Relation Age of Onset  . Thyroid cancer Mother   . Diabetes Father   . Heart disease Father   . Stroke Brother     cerebral hemm  . Colon cancer Brother   . Anesthesia problems Neg Hx   . Hypotension Neg Hx   . Malignant hyperthermia Neg Hx   . Pseudochol deficiency Neg Hx   . Prostate cancer Neg Hx   . Heart attack Father      Prior to Admission medications   Medication Sig Start Date End Date Taking? Authorizing Provider  benzonatate (TESSALON) 200 MG capsule Take 1 capsule (200 mg total) by mouth 3 (three) times daily as needed. 04/24/15   Tonia Ghent, MD  Cholecalciferol (VITAMIN D3) 2000 UNITS capsule Take 2,000 Units by mouth every morning.     Historical Provider, MD  dibucaine (NUPERCAINAL) 1 % ointment Apply topically 3 (three) times daily as needed for pain. 04/24/15   Tonia Ghent, MD  DULoxetine (CYMBALTA) 30 MG capsule One capsule daily for one week, then take one capsule twice a day 05/29/15   Kathrynn Ducking, MD  feeding supplement, ENSURE ENLIVE, (ENSURE ENLIVE) LIQD Take 237 mLs by mouth 3 (three) times daily between meals. 03/16/15   Debbe Odea, MD  finasteride (PROSCAR) 5 MG tablet Take 5 mg by mouth daily.    Historical Provider, MD  fluconazole (DIFLUCAN) 100 MG tablet Take 1 tablet (100 mg total) by mouth daily. 05/23/15   Owens Shark, NP  furosemide (LASIX) 40 MG tablet  Take 0.5 tablets (  20 mg total) by mouth daily. 04/17/15   Oswald Hillock, MD  hydrocortisone (ANUSOL-HC) 2.5 % rectal cream Apply topically 2 (two) times daily. Patient taking differently: Apply topically 2 (two) times daily as needed for hemorrhoids or itching.  04/17/15   Oswald Hillock, MD  levothyroxine (SYNTHROID, LEVOTHROID) 50 MCG tablet Take 1 tablet (50 mcg total) by mouth daily before breakfast. 05/19/15   Donne Hazel, MD  lidocaine-prilocaine (EMLA) cream Apply 1 application topically as needed. 04/06/15   Owens Shark, NP  LIVALO 4 MG TABS TAKE 1 TABLET AT BEDTIME 10/03/14   Jolaine Artist, MD  loperamide (IMODIUM A-D) 2 MG tablet Take 1 tablet (2 mg total) by mouth 2 (two) times daily as needed for diarrhea or loose stools. 04/24/15   Tonia Ghent, MD  Multiple Vitamin (MULTIVITAMIN WITH MINERALS) TABS tablet Take 1 tablet by mouth daily.    Historical Provider, MD  nitroGLYCERIN (NITROSTAT) 0.4 MG SL tablet Place 1 tablet (0.4 mg total) under the tongue every 5 (five) minutes as needed. For chest pain 05/24/15   Larey Dresser, MD  OVER THE COUNTER MEDICATION Place 2 drops into both eyes 4 (four) times daily. Wash type eye drop-    Historical Provider, MD  oxyCODONE-acetaminophen (ROXICET) 5-325 MG per tablet Take 1 tablet by mouth every 4 (four) hours as needed for severe pain. 03/21/15   Ladell Pier, MD  pantoprazole (PROTONIX) 40 MG tablet TAKE 1 TABLET DAILY Patient taking differently: TAKE 1 TABLET TWICE DAILY 05/08/15   Larey Dresser, MD  POLY-IRON 150 FORTE 867-61-9 MG-MCG-MG CAPS TAKE 1 CAPSULE TWICE A DAY 05/08/15   Larey Dresser, MD  pramoxine (PROCTOFOAM) 1 % foam Place 1 application rectally 3 (three) times daily as needed for itching. 04/24/15   Tonia Ghent, MD  pramoxine-hydrocortisone (PROCTOCREAM-HC) 1-1 % rectal cream Place 1 application rectally 2 (two) times daily.    Historical Provider, MD  predniSONE (DELTASONE) 5 MG tablet Take 1 tablet (5 mg total) by  mouth daily with breakfast. 03/31/15   Owens Shark, NP  Probiotic Product (PROBIOTIC & ACIDOPHILUS EX ST PO) Take 1 tablet by mouth every morning.     Historical Provider, MD  prochlorperazine (COMPAZINE) 5 MG tablet Take 1 tablet (5 mg total) by mouth every 6 (six) hours as needed for nausea or vomiting. 03/21/15   Ladell Pier, MD  Psyllium (METAMUCIL PO) Take 20 mLs by mouth daily as needed (constipation).     Historical Provider, MD  spironolactone (ALDACTONE) 25 MG tablet Take 0.5 tablets (12.5 mg total) by mouth daily. 04/17/15   Oswald Hillock, MD   Physical Exam: Filed Vitals:   06/01/15 1719  BP: 129/56  Pulse: 60  Temp: 97.8 F (36.6 C)  TempSrc: Oral  Resp: 20  SpO2: 100%    Wt Readings from Last 3 Encounters:  05/29/15 92.534 kg (204 lb)  05/25/15 92.534 kg (204 lb)  05/24/15 93.668 kg (206 lb 8 oz)    General:  Appears calm and comfortable Eyes: PERRL, normal lids, irises & conjunctiva ENT: grossly normal hearing, lips & tongue Neck: no LAD, masses or thyromegaly Cardiovascular: RRR, no m/r/g. +LE edema. Respiratory: CTA bilaterally, no w/r/r. Normal respiratory effort. Abdomen: soft, ntnd Skin: no rash or induration seen on limited exam Musculoskeletal: grossly normal tone BUE/BLE Psychiatric: grossly normal mood and affect Neurologic: grossly non-focal.          Labs on Admission:  Basic Metabolic Panel:  Recent Labs Lab 06/01/15 1755  NA 132*  K 4.8  CL 102  CO2 20*  GLUCOSE 155*  BUN 29*  CREATININE 0.89  CALCIUM 8.0*   Liver Function Tests:  Recent Labs Lab 06/01/15 1755  AST 61*  ALT 70*  ALKPHOS 380*  BILITOT 1.9*  PROT 5.2*  ALBUMIN 2.5*   No results for input(s): LIPASE, AMYLASE in the last 168 hours. No results for input(s): AMMONIA in the last 168 hours. CBC:  Recent Labs Lab 06/01/15 1755  WBC 12.1*  HGB 6.6*  HCT 21.3*  MCV 94.2  PLT 132*   Cardiac Enzymes: No results for input(s): CKTOTAL, CKMB, CKMBINDEX,  TROPONINI in the last 168 hours.  BNP (last 3 results)  Recent Labs  05/15/15 1644  BNP 157.2*    ProBNP (last 3 results)  Recent Labs  06/06/14 1244 11/29/14 0944 03/22/15 1513  PROBNP 83.0 114.6 71.0    CBG: No results for input(s): GLUCAP in the last 168 hours.  Radiological Exams on Admission: No results found.    Assessment/Plan Principal Problem:   Symptomatic anemia Active Problems:   OBESITY, MORBID   HYPERTENSION, BENIGN   HLD (hyperlipidemia)   Chronic diastolic CHF (congestive heart failure)   Gastrointestinal hemorrhage with melena   1. Anemia secondary to blood loss -will arrange for transusion 2 units PRBC -will start on octreotide drip protonix drip as per GI request -GI will see patient in the morning  2. GI bleed with Melena -he has a history of variceal bleeds and therefore will need further GI evalaution -will start drips as suggested by GI above -monitor Hgb over night and transfuse tonight -will keep NPO after midnight  3. HTN -will monitor blood pressures -currently pressures are under control  4. Hyperlipidemia -on statin  5. Chronic diastolic heart failure -will continue with diuretics -last EF noted to be 55-60%  6. h/o Duodenal Adenocarcinoma -patient is followed by oncology as an outpatient   7. H/o Esophageal Varices -start on octreotide and protonix drip  8. LE edema Swelling -will get LE doppler that was already ordered for outpatient -SCD after the doppler is done   Code Status: Full Code (must indicate code status--if unknown or must be presumed, indicate so) DVT Prophylaxis:SCD Family Communication: None (indicate person spoken with, if applicable, with phone number if by telephone) Disposition Plan: Home (indicate anticipated LOS)  Time spent: 55min  Divine Imber A [facility] Pager [phone]

## 2015-06-01 NOTE — ED Provider Notes (Signed)
CSN: [id number]     Arrival date & time 06/01/15  1705 History   First MD Initiated Contact with Patient 06/01/15 1716     Chief Complaint  Patient presents with  . Weakness  . 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  He was told to come to the ED for evaluation b/c of complaint of dark black stools yesterday X several, and to get hgb check.   He denies SOB, abd pain, light headedness, swelling oro there c/o.  Sx are mild, persistent, nothing makes better or worse, does not take blood thinner.  He does take Iron supplements.  Patient is a 82 y.o. male presenting with weakness. The history is provided by the patient.  Weakness    Past Medical History  Diagnosis Date  . Bradycardia     Hypertensive  . carotid bruit, right   . hypercholesterolemia     Trig 234/293;takes Simcor daily  . Obesity, morbid   . Vasculitis   . 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 Beryle Beams; Dr. Beryle Beams , normal platelets  . Dermatitis     legs  . Bleeding gums   . Chronic low back pain   . Myalgia and myositis   . Unspecified vitamin D deficiency   . Bladder diverticulum   . Primary osteoarthritis of both knees   . Nephrolithiasis   . History of elevated glucose   . Thrombophlebitis     right forearm  . History of BPH   . History of colonic polyps     [name]  . Epididymitis, left     S/P Epidimectomy  . Splenomegaly 05/15/09    abd U/S NML  . Hypertension, benign     takes Micardis daily  . Neuropathy     acute  . Bruises easily   . H/O hiatal  hernia   . GERD (gastroesophageal reflux disease)     hx of but doesn't take any meds now  . Urinary frequency   . Urinary urgency   . Nocturia   . Urinary leakage   . Abnormality of gait 03/19/2013  . DVT of lower limb, acute 11/19/2013    Peroneal & distal tibial left 11/01/13  . Polio 1941    "mild case"  . CHF (congestive heart failure)     "low grade" (06/22/2014)  . DVT (deep venous thrombosis) 10/2013    LLE  . Pneumonia, bacterial     RML resolved, spirometry, normal  . Pneumonia 1933  . History of blood transfusion 05/2014  . Anemia 05/2014    "related to Xarelto"  . Iron deficiency anemia 05/2014    "got iron infusion"  . Arthritis     "all over"  . Gout   . Malignant melanoma of ear     right  . Thrombophlebitis of superficial veins of right lower extremity 06/23/2014    Incidental finding doppler 05/30/14  . Shortness of breath   . Esophageal varices without mention of bleeding 08/08/2014  . Portal vein thrombosis   . Duodenal cancer    Past Surgical History  Procedure Laterality  Date  . Cystoscopy w/ stone manipulation  1950's  . Fem cutaneous nerve entrapment  1977    due to surgery (mild lat anesth of bilat legs)  . Limited pelvis/hip bone scan  04/99    negative  . Bone scan  04/00  . Cataract extraction w/ intraocular lens  implant, bilateral Bilateral ~ 2004  . Melanoma excision Right 06/04    ear; Wide local excision,,with flap  . Stress cardiolite  05/11/04    mild inf. ischemia, EF 71%  . Shoulder surgery Left 06/17/2005    "tore it all to pieces when I fell; not fractured"  . Bronchoscopy  09/08    RML collapse with chronic pneumonia, bx neg (Dr. Joya Gaskins)  . Cyst excision Right 03/07/09    middle finger, DIP Joint Mucoid (Dr. Fredna Dow)  . Eyelid & eyebrow lift  1996  . Cyst excision Right 08/15/09    middle finger, DIP Joint Mucoid Cyst (Dr. Fredna Dow)  . Appendectomy  1950  . Excision of mucoid tumor    . Colonoscopy    . Total knee arthroplasty   01/10/2012    Procedure: TOTAL KNEE ARTHROPLASTY;  Surgeon: Augustin Schooling, MD;  Location: [facility];  Service: Orthopedics;  Laterality: Left;  . Surgery scrotal / testicular Left ~ 2000    removal of mass, noncancerous  . Cardiac catheterization  05/23/04    mild plague-statin  . Coronary angioplasty with stent placement  06/22/2014    to proximal LAD  . Inguinal hernia repair Bilateral 1959  . Inguinal hernia repair Left 1970's  . Esophagogastroduodenoscopy N/A 08/08/2014    Procedure: ESOPHAGOGASTRODUODENOSCOPY (EGD);  Surgeon: Gatha Mayer, MD;  Location: [facility] ENDOSCOPY;  Service: Endoscopy;  Laterality: N/A;  . Colonoscopy N/A 08/08/2014    Procedure: COLONOSCOPY;  Surgeon: Gatha Mayer, MD;  Location: [facility];  Service: Endoscopy;  Laterality: N/A;  . Flexible sigmoidoscopy N/A 09/21/2014    Procedure: FLEXIBLE SIGMOIDOSCOPY;  Surgeon: Jerene Bears, MD;  Location: [facility];  Service: Gastroenterology;  Laterality: N/A;  . Givens capsule study N/A 09/21/2014    Procedure: GIVENS CAPSULE STUDY;  Surgeon: Jerene Bears, MD;  Location: [facility] ENDOSCOPY;  Service: Endoscopy;  Laterality: N/A;  . Left and right heart catheterization with coronary angiogram N/A 06/22/2014    Procedure: LEFT AND RIGHT HEART CATHETERIZATION WITH CORONARY ANGIOGRAM;  Surgeon: Larey Dresser, MD;  Location: [facility] CATH LAB;  Service: Cardiovascular;  Laterality: N/A;  . Percutaneous coronary stent intervention (pci-s)  06/22/2014    Procedure: PERCUTANEOUS CORONARY STENT INTERVENTION (PCI-S);  Surgeon: Larey Dresser, MD;  Location: [facility] CATH LAB;  Service: Cardiovascular;;  . Colonoscopy N/A 01/25/2015    Procedure: COLONOSCOPY;  Surgeon: Gatha Mayer, MD;  Location: [facility] ENDOSCOPY;  Service: Endoscopy;  Laterality: N/A;  . Cystoscopy with biopsy Right 02/03/2015    Procedure: CYSTOSCOPY WITH COLD CUP BLADDER BIOPSY CAUTHERIZAITON OF RIGHT BLADDER CANCER AND SATILITE, TURBT RIGHT BLADDER BASE;  Surgeon: Ailene Rud,  MD;  Location: [facility] ORS;  Service: Urology;  Laterality: Right;  . Esophagogastroduodenoscopy N/A 03/03/2015    Procedure: ESOPHAGOGASTRODUODENOSCOPY (EGD);  Surgeon: Jerene Bears, MD;  Location: [facility] ENDOSCOPY;  Service: Gastroenterology;  Laterality: N/A;  . Gastrojejunostomy N/A 03/09/2015    Procedure: LAPAROSCOPIC GASTROJEJUNOSTOMY;  Surgeon: Excell Seltzer, MD;  Location: [facility] ORS;  Service: General;  Laterality: N/A;  . Portacath placement Right 04/05/2015    Procedure: INSERTION PORT-A-CATH;  Surgeon: Excell Seltzer, MD;  Location: [facility];  Service: General;  Laterality: Right;  . Esophagogastroduodenoscopy N/A 04/14/2015    Procedure: ESOPHAGOGASTRODUODENOSCOPY (EGD);  Surgeon: Jerene Bears, MD;  Location: [facility] ENDOSCOPY;  Service: Endoscopy;  Laterality: N/A;  bedside case   Family History  Problem Relation Age of Onset  . Thyroid cancer Mother   . Diabetes Father   . Heart disease Father   . Stroke Brother     cerebral hemm  . Colon cancer Brother   . Anesthesia problems Neg Hx   . Hypotension Neg Hx   . Malignant hyperthermia Neg Hx   . Pseudochol deficiency Neg Hx   . Prostate cancer Neg Hx   . Heart attack Father    History  Substance Use Topics  . Smoking status: Former Smoker -- 2.00 packs/day for 20 years    Types: Cigarettes, Cigars    Quit date: 09/27/1967  . Smokeless tobacco: Never Used  . Alcohol Use: No    Review of Systems  Neurological: Positive for weakness.  All other systems reviewed and are negative.     Allergies  Sulfa antibiotics; Aleve; Rofecoxib; Sulfamethoxazole-trimethoprim; Tape; Tramadol hcl; Neomycin-bacitracin zn-polymyx; Neosporin; and Penicillins  Home Medications   Prior to Admission medications   Medication Sig Start Date End Date Taking? Authorizing Provider  benzonatate (TESSALON) 200 MG capsule Take 1 capsule (200 mg total) by mouth 3 (three) times daily as needed. 04/24/15   Tonia Ghent, MD   Cholecalciferol (VITAMIN D3) 2000 UNITS capsule Take 2,000 Units by mouth every morning.     Historical Provider, MD  dibucaine (NUPERCAINAL) 1 % ointment Apply topically 3 (three) times daily as needed for pain. 04/24/15   Tonia Ghent, MD  DULoxetine (CYMBALTA) 30 MG capsule One capsule daily for one week, then take one capsule twice a day 05/29/15   Kathrynn Ducking, MD  feeding supplement, ENSURE ENLIVE, (ENSURE ENLIVE) LIQD Take 237 mLs by mouth 3 (three) times daily between meals. 03/16/15   Debbe Odea, MD  finasteride (PROSCAR) 5 MG tablet Take 5 mg by mouth daily.    Historical Provider, MD  fluconazole (DIFLUCAN) 100 MG tablet Take 1 tablet (100 mg total) by mouth daily. 05/23/15   Owens Shark, NP  furosemide (LASIX) 40 MG tablet Take 0.5 tablets (20 mg total) by mouth daily. 04/17/15   Oswald Hillock, MD  hydrocortisone (ANUSOL-HC) 2.5 % rectal cream Apply topically 2 (two) times daily. Patient taking differently: Apply topically 2 (two) times daily as needed for hemorrhoids or itching.  04/17/15   Oswald Hillock, MD  levothyroxine (SYNTHROID, LEVOTHROID) 50 MCG tablet Take 1 tablet (50 mcg total) by mouth daily before breakfast. 05/19/15   Donne Hazel, MD  lidocaine-prilocaine (EMLA) cream Apply 1 application topically as needed. 04/06/15   Owens Shark, NP  LIVALO 4 MG TABS TAKE 1 TABLET AT BEDTIME 10/03/14   Jolaine Artist, MD  loperamide (IMODIUM A-D) 2 MG tablet Take 1 tablet (2 mg total) by mouth 2 (two) times daily as needed for diarrhea or loose stools. 04/24/15   Tonia Ghent, MD  Multiple Vitamin (MULTIVITAMIN WITH MINERALS) TABS tablet Take 1 tablet by mouth daily.    Historical Provider, MD  nitroGLYCERIN (NITROSTAT) 0.4 MG SL tablet Place 1 tablet (0.4 mg total) under the tongue every 5 (five) minutes as needed. For chest pain 05/24/15   Larey Dresser, MD  OVER THE COUNTER MEDICATION Place 2 drops into both eyes 4 (four) times daily.  [place] type eye drop-    Historical  Provider, MD  oxyCODONE-acetaminophen (ROXICET) 5-325 MG per tablet Take 1 tablet by mouth every 4 (four) hours as needed for severe pain. 03/21/15   Ladell Pier, MD  pantoprazole (PROTONIX) 40 MG tablet TAKE 1 TABLET DAILY Patient taking differently: TAKE 1 TABLET TWICE DAILY 05/08/15   Larey Dresser, MD  POLY-IRON 150 FORTE 086-57-8 MG-MCG-MG CAPS TAKE 1 CAPSULE TWICE A DAY 05/08/15   Larey Dresser, MD  pramoxine (PROCTOFOAM) 1 % foam Place 1 application rectally 3 (three) times daily as needed for itching. 04/24/15   Tonia Ghent, MD  pramoxine-hydrocortisone (PROCTOCREAM-HC) 1-1 % rectal cream Place 1 application rectally 2 (two) times daily.    Historical Provider, MD  predniSONE (DELTASONE) 5 MG tablet Take 1 tablet (5 mg total) by mouth daily with breakfast. 03/31/15   Owens Shark, NP  Probiotic Product (PROBIOTIC & ACIDOPHILUS EX ST PO) Take 1 tablet by mouth every morning.     Historical Provider, MD  prochlorperazine (COMPAZINE) 5 MG tablet Take 1 tablet (5 mg total) by mouth every 6 (six) hours as needed for nausea or vomiting. 03/21/15   Ladell Pier, MD  Psyllium (METAMUCIL PO) Take 20 mLs by mouth daily as needed (constipation).     Historical Provider, MD  spironolactone (ALDACTONE) 25 MG tablet Take 0.5 tablets (12.5 mg total) by mouth daily. 04/17/15   Oswald Hillock, MD   BP 129/56 mmHg  Pulse 60  Temp(Src) 97.8 F (36.6 C) (Oral)  Resp 20  SpO2 100% Physical Exam  Constitutional: He appears well-developed and well-nourished. No distress.  HENT:  Head: Normocephalic and atraumatic.  Mouth/Throat: Oropharynx is clear and moist. No oropharyngeal exudate.  Eyes: Conjunctivae and EOM are normal. Pupils are equal, round, and reactive to light. Right eye exhibits no discharge. Left eye exhibits no discharge. No scleral icterus.  Neck: Normal range of motion. Neck supple. No JVD present. No thyromegaly present.  Cardiovascular: Normal rate, regular rhythm, normal heart  sounds and intact distal pulses.  Exam reveals no gallop and no friction rub.   No murmur heard. Pulmonary/Chest: Effort normal and breath sounds normal. No respiratory distress. He has no wheezes. He has no rales.  Abdominal: Soft. Bowel sounds are normal. He exhibits no distension and no mass. There is no tenderness.  Genitourinary:  Has melanotic stool in rectal vault, no masses, no gross blood, no fissure.  Musculoskeletal: Normal range of motion. He exhibits no edema or tenderness.  Lymphadenopathy:    He has no cervical adenopathy.  Neurological: He is alert. Coordination normal.  Skin: Skin is warm and dry. No rash noted. No erythema.  Psychiatric: He has a normal mood and affect. His behavior is normal.  Nursing note and vitals reviewed.   ED Course  Procedures (including critical care time) Labs Review Labs Reviewed  CBC - Abnormal; Notable for the following:    WBC 12.1 (*)    RBC 2.26 (*)    Hemoglobin 6.6 (*)    HCT 21.3 (*)    RDW 24.2 (*)    Platelets 132 (*)    All other components within normal limits  COMPREHENSIVE METABOLIC PANEL - Abnormal; Notable for the following:    Sodium 132 (*)    CO2 20 (*)    Glucose, Bld 155 (*)    BUN 29 (*)    Calcium 8.0 (*)    Total Protein 5.2 (*)    Albumin  2.5 (*)    AST 61 (*)    ALT 70 (*)    Alkaline Phosphatase 380 (*)    Total Bilirubin 1.9 (*)    All other components within normal limits  POC OCCULT BLOOD, ED - Abnormal; Notable for the following:    Fecal Occult Bld POSITIVE (*)    All other components within normal limits  POC OCCULT BLOOD, ED  TYPE AND SCREEN  PREPARE RBC (CROSSMATCH)    Imaging Review No results found.   MDM   Final diagnoses:  Upper GI bleed  Severe anemia    Pt has recurrent GI bleed Reassuring vital signs however with Hemoccult-positive stool and a history of GI bleeding with esophageal varices there is concern for recurrent GI bleed. He will need to be admitted to the  hospital, clubbing check, possible transfusion  Filed Vitals:   06/01/15 1719  BP: 129/56  Pulse: 60  Temp: 97.8 F (36.6 C)  TempSrc: Oral  Resp: 20  SpO2: 100%   Discussed with Dr. Deatra Ina of the gastroenterology service at 6:40 PM, he recommended starting octreotide drip, proton pump inhibitor drip, keep the patient nothing by mouth him a IV fluids, type and screen and he will prepare to perform an endoscopy. Otherwise the hospitalist to see the patient for admission, they will come to the emergency department to see the patient. The patient is critically ill with ongoing GI bleeding requiring octreotide drip, he does have esophageal varices which makes him high risk for rapid decompensation.  Transfusion orders placed to be started in the ED.  D/w DR. Humphrey Rolls who will admit   Meds given in ED:  Medications  octreotide (SANDOSTATIN) 2 mcg/mL load via infusion 50 mcg (not administered)    And  octreotide (SANDOSTATIN) 500 mcg in sodium chloride 0.9 % 250 mL (2 mcg/mL) infusion (not administered)  pantoprazole (PROTONIX) 80 mg in sodium chloride 0.9 % 100 mL IVPB (not administered)  pantoprazole (PROTONIX) 80 mg in sodium chloride 0.9 % 250 mL (0.32 mg/mL) infusion (not administered)  pantoprazole (PROTONIX) injection 40 mg (not administered)  0.9 %  sodium chloride infusion (not administered)     CRITICAL CARE Performed by: Noemi Chapel D Total critical care time: 35 Critical care time was exclusive of separately billable procedures and treating other patients. Critical care was necessary to treat or prevent imminent or life-threatening deterioration. Critical care was time spent personally by me on the following activities: development of treatment plan with patient and/or surrogate as well as nursing, discussions with consultants, evaluation of patient's response to treatment, examination of patient, obtaining history from patient or surrogate, ordering and performing treatments  and interventions, ordering and review of laboratory studies, ordering and review of radiographic studies, pulse oximetry and re-evaluation of patient's condition.   Noemi Chapel, MD 06/01/15 [phone]

## 2015-06-02 ENCOUNTER — Inpatient Hospital Stay (HOSPITAL_COMMUNITY): Payer: Medicare Other

## 2015-06-02 ENCOUNTER — Inpatient Hospital Stay: Payer: Medicare Other

## 2015-06-02 DIAGNOSIS — I82402 Acute embolism and thrombosis of unspecified deep veins of left lower extremity: Secondary | ICD-10-CM

## 2015-06-02 DIAGNOSIS — R609 Edema, unspecified: Secondary | ICD-10-CM

## 2015-06-02 DIAGNOSIS — C170 Malignant neoplasm of duodenum: Principal | ICD-10-CM

## 2015-06-02 DIAGNOSIS — K274 Chronic or unspecified peptic ulcer, site unspecified, with hemorrhage: Secondary | ICD-10-CM

## 2015-06-02 DIAGNOSIS — I5032 Chronic diastolic (congestive) heart failure: Secondary | ICD-10-CM

## 2015-06-02 DIAGNOSIS — K921 Melena: Secondary | ICD-10-CM

## 2015-06-02 DIAGNOSIS — D649 Anemia, unspecified: Secondary | ICD-10-CM

## 2015-06-02 LAB — COMPREHENSIVE METABOLIC PANEL
ALBUMIN: 2.4 g/dL — AB (ref 3.5–5.0)
ALT: 69 U/L — ABNORMAL HIGH (ref 17–63)
AST: 45 U/L — AB (ref 15–41)
Alkaline Phosphatase: 376 U/L — ABNORMAL HIGH (ref 38–126)
Anion gap: 9 (ref 5–15)
BUN: 26 mg/dL — ABNORMAL HIGH (ref 6–20)
CALCIUM: 8 mg/dL — AB (ref 8.9–10.3)
CO2: 23 mmol/L (ref 22–32)
Chloride: 101 mmol/L (ref 101–111)
Creatinine, Ser: 0.86 mg/dL (ref 0.61–1.24)
GLUCOSE: 141 mg/dL — AB (ref 65–99)
Potassium: 4.1 mmol/L (ref 3.5–5.1)
SODIUM: 133 mmol/L — AB (ref 135–145)
Total Bilirubin: 2.9 mg/dL — ABNORMAL HIGH (ref 0.3–1.2)
Total Protein: 5.1 g/dL — ABNORMAL LOW (ref 6.5–8.1)

## 2015-06-02 LAB — CBC
HCT: 24.9 % — ABNORMAL LOW (ref 39.0–52.0)
Hemoglobin: 8.1 g/dL — ABNORMAL LOW (ref 13.0–17.0)
MCH: 30.2 pg (ref 26.0–34.0)
MCHC: 32.5 g/dL (ref 30.0–36.0)
MCV: 92.9 fL (ref 78.0–100.0)
Platelets: 94 10*3/uL — ABNORMAL LOW (ref 150–400)
RBC: 2.68 MIL/uL — ABNORMAL LOW (ref 4.22–5.81)
RDW: 21.2 % — AB (ref 11.5–15.5)
WBC: 11 10*3/uL — AB (ref 4.0–10.5)

## 2015-06-02 LAB — GLUCOSE, CAPILLARY
GLUCOSE-CAPILLARY: 163 mg/dL — AB (ref 65–99)
Glucose-Capillary: 104 mg/dL — ABNORMAL HIGH (ref 65–99)
Glucose-Capillary: 125 mg/dL — ABNORMAL HIGH (ref 65–99)
Glucose-Capillary: 187 mg/dL — ABNORMAL HIGH (ref 65–99)

## 2015-06-02 LAB — PROTIME-INR
INR: 1.35 (ref 0.00–1.49)
Prothrombin Time: 16.8 seconds — ABNORMAL HIGH (ref 11.6–15.2)

## 2015-06-02 LAB — HEMOGLOBIN AND HEMATOCRIT, BLOOD
HCT: 27 % — ABNORMAL LOW (ref 39.0–52.0)
Hemoglobin: 8.8 g/dL — ABNORMAL LOW (ref 13.0–17.0)

## 2015-06-02 LAB — APTT: APTT: 26 s (ref 24–37)

## 2015-06-02 LAB — PREPARE RBC (CROSSMATCH)

## 2015-06-02 MED ORDER — FENTANYL CITRATE (PF) 100 MCG/2ML IJ SOLN
INTRAMUSCULAR | Status: AC | PRN
  Administered 2015-06-02: 25 ug via INTRAVENOUS

## 2015-06-02 MED ORDER — IOHEXOL 300 MG/ML  SOLN
25.0000 mL | Freq: Once | INTRAMUSCULAR | Status: AC | PRN
  Administered 2015-06-02: 25 mL via INTRAVENOUS

## 2015-06-02 MED ORDER — MIDAZOLAM HCL 2 MG/2ML IJ SOLN
INTRAMUSCULAR | Status: AC
  Filled 2015-06-02: qty 2

## 2015-06-02 MED ORDER — SODIUM CHLORIDE 0.9 % IV SOLN
Freq: Once | INTRAVENOUS | Status: AC
  Administered 2015-06-02: 12:00:00 via INTRAVENOUS

## 2015-06-02 MED ORDER — LORAZEPAM 0.5 MG PO TABS
0.5000 mg | ORAL_TABLET | Freq: Four times a day (QID) | ORAL | Status: DC | PRN
  Administered 2015-06-03 – 2015-06-04 (×3): 0.5 mg via ORAL
  Filled 2015-06-02 (×3): qty 1

## 2015-06-02 MED ORDER — MIDAZOLAM HCL 2 MG/2ML IJ SOLN
INTRAMUSCULAR | Status: AC | PRN
  Administered 2015-06-02: 1 mg via INTRAVENOUS

## 2015-06-02 MED ORDER — LIDOCAINE HCL 1 % IJ SOLN
INTRAMUSCULAR | Status: AC
  Filled 2015-06-02: qty 20

## 2015-06-02 MED ORDER — FENTANYL CITRATE (PF) 100 MCG/2ML IJ SOLN
INTRAMUSCULAR | Status: AC
  Filled 2015-06-02: qty 2

## 2015-06-02 MED ORDER — IOHEXOL 300 MG/ML  SOLN
25.0000 mL | Freq: Once | INTRAMUSCULAR | Status: AC | PRN
  Administered 2015-06-02: 10 mL via INTRAVENOUS

## 2015-06-02 NOTE — Consult Note (Signed)
Consultation  Referring Provider: [facility] Primary Care Physician:  [name], MD Primary Gastroenterologist:  Dr. Carlean Purl  Reason for Consultation:  GI Bleeding  HPI: James [name] is a 81 y.o. male who was admitted through the emergency room last evening with weakness and 48 hour history of black stools. 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 He underwent a palliative gastrojejunostomy in March 2016. Appendectomy is being followed by Dr. Benay Spice for oncology and was undergoing treatment with FOLFOX area that is on hold at present due to his poor performance status and a recent pneumonia. He last had EGD in April 2016 for evaluation of hematemesis. He was found to have small to medium esophageal varices and a very large circumferential ulcerated mass in the duodenal bulb. This is not amenable to endoscopic therapy. Patient has had ongoing slow GI blood loss since then and has been receiving transfusions every couple of weeks as an outpatient. His daughter reports his last transfusion was about 3 weeks ago. Hemoglobin on 05/23/2015 was 8.4 hemoglobin 6.6 last evening on admission and after 2 unit transfusion is up to 8.1 today. Patient has not had any nausea, vomiting, abdominal pain or hematemesis. He has had no further melena since admission. He has not been on any aspirin or NSAIDs. His daughter is concerned because he had just started on Cymbalta for neuropathy symptoms and wondered if that aggravated some of the bleeding. Unfortunately is also had swelling bilaterally in his lower extremities and preliminary reading from Doppler this morning shows DVTs. His prior history of DVT, but obviously has not been on anticoagulation due to his metastatic cancer.   Past Medical History  Diagnosis Date  . Bradycardia     Hypertensive  . carotid bruit, right   . hypercholesterolemia     Trig  234/293;takes Simcor daily  . Obesity, morbid   . Vasculitis   . 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 Beryle Beams; Dr. Beryle Beams , normal platelets  . Dermatitis     legs  . Bleeding gums   . Chronic low back pain   . Myalgia and myositis   . Unspecified vitamin D deficiency   . Bladder diverticulum   . Primary osteoarthritis of both knees   . Nephrolithiasis   . History of elevated glucose   . Thrombophlebitis     right forearm  . History of BPH   . History of colonic polyps     [name]  . Epididymitis, left     S/P Epidimectomy  . Splenomegaly 05/15/09    abd U/S NML  . Hypertension, benign     takes Micardis daily  . Neuropathy     acute  . Bruises easily   . H/O hiatal hernia   . GERD (gastroesophageal reflux disease)     hx of but doesn't take any meds now  . Urinary frequency   . Urinary urgency   . Nocturia   . Urinary leakage   . Abnormality of gait 03/19/2013  . DVT of lower limb, acute 11/19/2013    Peroneal & distal tibial left 11/01/13  . Polio 1941    "mild case"  . CHF (congestive heart failure)     "low grade" (06/22/2014)  . DVT (deep venous thrombosis) 10/2013    LLE  . Pneumonia, bacterial     RML resolved, spirometry, normal  . Pneumonia 1933  .  History of blood transfusion 05/2014  . Anemia 05/2014    "related to Xarelto"  . Iron deficiency anemia 05/2014    "got iron infusion"  . Arthritis     "all over"  . Gout   . Malignant melanoma of ear     right  . Thrombophlebitis of superficial veins of right lower extremity 06/23/2014    Incidental finding doppler 05/30/14  . Shortness of breath   . Esophageal varices without mention of bleeding 08/08/2014  . Portal vein thrombosis   . Duodenal cancer     Past Surgical History  Procedure Laterality Date  . Cystoscopy w/ stone manipulation  1950's  . Fem cutaneous nerve entrapment  1977    due to surgery (mild lat anesth of bilat legs)  . Limited  pelvis/hip bone scan  04/99    negative  . Bone scan  04/00  . Cataract extraction w/ intraocular lens  implant, bilateral Bilateral ~ 2004  . Melanoma excision Right 06/04    ear; Wide local excision,,with flap  . Stress cardiolite  05/11/04    mild inf. ischemia, EF 71%  . Shoulder surgery Left 06/17/2005    "tore it all to pieces when I fell; not fractured"  . Bronchoscopy  09/08    RML collapse with chronic pneumonia, bx neg (Dr. Joya Gaskins)  . Cyst excision Right 03/07/09    middle finger, DIP Joint Mucoid (Dr. Fredna Dow)  . Eyelid & eyebrow lift  1996  . Cyst excision Right 08/15/09    middle finger, DIP Joint Mucoid Cyst (Dr. Fredna Dow)  . Appendectomy  1950  . Excision of mucoid tumor    . Colonoscopy    . Total knee arthroplasty  01/10/2012    Procedure: TOTAL KNEE ARTHROPLASTY;  Surgeon: Augustin Schooling, MD;  Location: [place];  Service: Orthopedics;  Laterality: Left;  . Surgery scrotal / testicular Left ~ 2000    removal of mass, noncancerous  . Cardiac catheterization  05/23/04    mild plague-statin  . Coronary angioplasty with stent placement  06/22/2014    to proximal LAD  . Inguinal hernia repair Bilateral 1959  . Inguinal hernia repair Left 1970's  . Esophagogastroduodenoscopy N/A 08/08/2014    Procedure: ESOPHAGOGASTRODUODENOSCOPY (EGD);  Surgeon: Gatha Mayer, MD;  Location: [facility] ENDOSCOPY;  Service: Endoscopy;  Laterality: N/A;  . Colonoscopy N/A 08/08/2014    Procedure: COLONOSCOPY;  Surgeon: Gatha Mayer, MD;  Location: [place];  Service: Endoscopy;  Laterality: N/A;  . Flexible sigmoidoscopy N/A 09/21/2014    Procedure: FLEXIBLE SIGMOIDOSCOPY;  Surgeon: Jerene Bears, MD;  Location: [place];  Service: Gastroenterology;  Laterality: N/A;  . Givens capsule study N/A 09/21/2014    Procedure: GIVENS CAPSULE STUDY;  Surgeon: Jerene Bears, MD;  Location: [facility] ENDOSCOPY;  Service: Endoscopy;  Laterality: N/A;  . Left and right heart catheterization with coronary angiogram  N/A 06/22/2014    Procedure: LEFT AND RIGHT HEART CATHETERIZATION WITH CORONARY ANGIOGRAM;  Surgeon: Larey Dresser, MD;  Location: [facility] CATH LAB;  Service: Cardiovascular;  Laterality: N/A;  . Percutaneous coronary stent intervention (pci-s)  06/22/2014    Procedure: PERCUTANEOUS CORONARY STENT INTERVENTION (PCI-S);  Surgeon: Larey Dresser, MD;  Location: [facility] CATH LAB;  Service: Cardiovascular;;  . Colonoscopy N/A 01/25/2015    Procedure: COLONOSCOPY;  Surgeon: Gatha Mayer, MD;  Location: [facility] ENDOSCOPY;  Service: Endoscopy;  Laterality: N/A;  . Cystoscopy with biopsy Right 02/03/2015    Procedure: CYSTOSCOPY WITH COLD CUP  BLADDER BIOPSY CAUTHERIZAITON OF RIGHT BLADDER CANCER AND SATILITE, TURBT RIGHT BLADDER BASE;  Surgeon: Ailene Rud, MD;  Location: [facility] ORS;  Service: Urology;  Laterality: Right;  . Esophagogastroduodenoscopy N/A 03/03/2015    Procedure: ESOPHAGOGASTRODUODENOSCOPY (EGD);  Surgeon: Jerene Bears, MD;  Location: [facility];  Service: Gastroenterology;  Laterality: N/A;  . Gastrojejunostomy N/A 03/09/2015    Procedure: LAPAROSCOPIC GASTROJEJUNOSTOMY;  Surgeon: Excell Seltzer, MD;  Location: [facility] ORS;  Service: General;  Laterality: N/A;  . Portacath placement Right 04/05/2015    Procedure: INSERTION PORT-A-CATH;  Surgeon: Excell Seltzer, MD;  Location: [place];  Service: General;  Laterality: Right;  . Esophagogastroduodenoscopy N/A 04/14/2015    Procedure: ESOPHAGOGASTRODUODENOSCOPY (EGD);  Surgeon: Jerene Bears, MD;  Location: [facility] ENDOSCOPY;  Service: Endoscopy;  Laterality: N/A;  bedside case    Prior to Admission medications   Medication Sig Start Date End Date Taking? Authorizing Provider  benzonatate (TESSALON) 200 MG capsule Take 1 capsule (200 mg total) by mouth 3 (three) times daily as needed. 04/24/15  Yes Tonia Ghent, MD  Cholecalciferol (VITAMIN D3) 2000 UNITS capsule Take 2,000 Units by mouth every morning.    Yes Historical Provider, MD    dibucaine (NUPERCAINAL) 1 % ointment Apply topically 3 (three) times daily as needed for pain. 04/24/15  Yes Tonia Ghent, MD  DULoxetine (CYMBALTA) 30 MG capsule One capsule daily for one week, then take one capsule twice a day 05/29/15  Yes Kathrynn Ducking, MD  feeding supplement, ENSURE ENLIVE, (ENSURE ENLIVE) LIQD Take 237 mLs by mouth 3 (three) times daily between meals. 03/16/15  Yes Debbe Odea, MD  finasteride (PROSCAR) 5 MG tablet Take 5 mg by mouth daily.   Yes Historical Provider, MD  furosemide (LASIX) 40 MG tablet Take 0.5 tablets (20 mg total) by mouth daily. 04/17/15  Yes Oswald Hillock, MD  hydrocortisone (ANUSOL-HC) 2.5 % rectal cream Apply topically 2 (two) times daily. Patient taking differently: Apply topically 2 (two) times daily as needed for hemorrhoids or itching.  04/17/15  Yes Oswald Hillock, MD  levothyroxine (SYNTHROID, LEVOTHROID) 50 MCG tablet Take 1 tablet (50 mcg total) by mouth daily before breakfast. 05/19/15  Yes Donne Hazel, MD  lidocaine-prilocaine (EMLA) cream Apply 1 application topically as needed. Patient taking differently: Apply 1 application topically as needed (to port site.).  04/06/15  Yes Owens Shark, NP  LIVALO 4 MG TABS TAKE 1 TABLET AT BEDTIME 10/03/14  Yes Jolaine Artist, MD  loperamide (IMODIUM A-D) 2 MG tablet Take 1 tablet (2 mg total) by mouth 2 (two) times daily as needed for diarrhea or loose stools. 04/24/15  Yes Tonia Ghent, MD  Multiple Vitamin (MULTIVITAMIN WITH MINERALS) TABS tablet Take 1 tablet by mouth daily.   Yes Historical Provider, MD  nitroGLYCERIN (NITROSTAT) 0.4 MG SL tablet Place 1 tablet (0.4 mg total) under the tongue every 5 (five) minutes as needed. For chest pain 05/24/15  Yes Larey Dresser, MD  OVER THE COUNTER MEDICATION Place 2 drops into both eyes 4 (four) times daily. Wash type eye drop-   Yes Historical Provider, MD  oxyCODONE-acetaminophen (ROXICET) 5-325 MG per tablet Take 1 tablet by mouth every 4 (four) hours  as needed for severe pain. 03/21/15  Yes Ladell Pier, MD  pantoprazole (PROTONIX) 40 MG tablet TAKE 1 TABLET DAILY Patient taking differently: TAKE 1 TABLET TWICE DAILY 05/08/15  Yes Larey Dresser, MD  POLY-IRON 150 FORTE [id number]  MG-MCG-MG CAPS TAKE 1 CAPSULE TWICE A DAY 05/08/15  Yes Larey Dresser, MD  pramoxine-hydrocortisone (PROCTOCREAM-HC) 1-1 % rectal cream Place 1 application rectally 2 (two) times daily.   Yes Historical Provider, MD  predniSONE (DELTASONE) 5 MG tablet Take 5-60 mg by mouth as directed. Starting 05/22/15:  Take 12 tabs daily x 4 days, take 8 tabs daily x 4 days, take 4 tabs daily x 4 days, take 2 tabs daily x 4 days, then resume 5 mg daily.   Yes Historical Provider, MD  [place]; [place].   Yes Historical Provider, MD  Probiotic Product (PROBIOTIC & ACIDOPHILUS EX ST PO) Take 1 tablet by mouth every morning.    Yes Historical Provider, MD  prochlorperazine (COMPAZINE) 5 MG tablet Take 1 tablet (5 mg total) by mouth every 6 (six) hours as needed for nausea or vomiting. 03/21/15  Yes Ladell Pier, MD  Psyllium (METAMUCIL PO) Take 20 mLs by mouth daily as needed (constipation).    Yes Historical Provider, MD  spironolactone (ALDACTONE) 25 MG tablet Take 0.5 tablets (12.5 mg total) by mouth daily. 04/17/15  Yes Oswald Hillock, MD  fluconazole (DIFLUCAN) 100 MG tablet Take 1 tablet (100 mg total) by mouth daily. Patient not taking: Reported on 06/01/2015 05/23/15   Owens Shark, NP  pramoxine (PROCTOFOAM) 1 % foam Place 1 application rectally 3 (three) times daily as needed for itching. Patient not taking: Reported on 06/01/2015 04/24/15   Tonia Ghent, MD  predniSONE (DELTASONE) 5 MG tablet Take 1 tablet (5 mg total) by mouth daily with breakfast. 03/31/15   Owens Shark, NP    Current Facility-Administered Medications  Medication Dose Route Frequency Provider Last Rate Last Dose  . 0.9 %  sodium chloride infusion   Intravenous Continuous Allyne Gee, MD  50 mL/hr at 06/02/15 0600    . antiseptic oral rinse (CPC / CETYLPYRIDINIUM CHLORIDE 0.05%) solution 7 mL  7 mL Mouth Rinse q12n4p Allyne Gee, MD      . benzonatate (TESSALON) capsule 200 mg  200 mg Oral Q4H PRN Allyne Gee, MD      . chlorhexidine (PERIDEX) 0.12 % solution 15 mL  15 mL Mouth Rinse BID Allyne Gee, MD   15 mL at 06/02/15 0815  . cholecalciferol (VITAMIN D) tablet 2,000 Units  2,000 Units Oral Daily Allyne Gee, MD      . dibucaine (NUPERCAINAL) 1 % rectal ointment   Rectal PRN Allyne Gee, MD      . DULoxetine (CYMBALTA) DR capsule 30 mg  30 mg Oral BID Allyne Gee, MD   30 mg at 06/01/15 2133  . feeding supplement (ENSURE ENLIVE) (ENSURE ENLIVE) liquid 237 mL  237 mL Oral TID BM Allyne Gee, MD   237 mL at 06/02/15 1000  . finasteride (PROSCAR) tablet 5 mg  5 mg Oral Daily Allyne Gee, MD      . folic acid (FOLVITE) tablet 1 mg  1 mg Oral Daily Allyne Gee, MD   1 mg at 06/01/15 2132  . furosemide (LASIX) tablet 20 mg  20 mg Oral Daily Allyne Gee, MD      . hydrocortisone-pramoxine [facility]) rectal foam 1 applicator  1 applicator Rectal BID Allyne Gee, MD   1 applicator at 28/78/67 6720  . insulin aspart (novoLOG) injection 0-15 Units  0-15 Units Subcutaneous TID WC Allyne Gee, MD   0 Units at 06/02/15  0800  . iron polysaccharides (NIFEREX) capsule 150 mg  150 mg Oral BID Allyne Gee, MD   150 mg at 06/01/15 2221  . levothyroxine (SYNTHROID, LEVOTHROID) tablet 50 mcg  50 mcg Oral QAC breakfast Allyne Gee, MD   50 mcg at 06/02/15 0813  . multivitamin with minerals tablet 1 tablet  1 tablet Oral Daily Allyne Gee, MD   1 tablet at 06/01/15 2133  . nitroGLYCERIN (NITROSTAT) SL tablet 0.4 mg  0.4 mg Sublingual Q5 min PRN Allyne Gee, MD      . octreotide (SANDOSTATIN) 2 mcg/mL load via infusion 50 mcg  50 mcg Intravenous Once Noemi Chapel, MD       And  . octreotide (SANDOSTATIN) 500 mcg in sodium chloride 0.9 % 250 mL (2 mcg/mL) infusion   50 mcg/hr Intravenous Continuous Noemi Chapel, MD 25 mL/hr at 06/02/15 0600 50 mcg/hr at 06/02/15 0600  . ondansetron (ZOFRAN) tablet 4 mg  4 mg Oral Q6H PRN Allyne Gee, MD       Or  . ondansetron [facility]) injection 4 mg  4 mg Intravenous Q6H PRN Allyne Gee, MD      . oxyCODONE-acetaminophen (PERCOCET/ROXICET) 5-325 MG per tablet 1 tablet  1 tablet Oral Q4H PRN Allyne Gee, MD      . pantoprazole (PROTONIX) 80 mg in sodium chloride 0.9 % 250 mL (0.32 mg/mL) infusion  8 mg/hr Intravenous Continuous Noemi Chapel, MD 25 mL/hr at 06/02/15 0600 8 mg/hr at 06/02/15 0600  . [START ON 06/05/2015] pantoprazole (PROTONIX) injection 40 mg  40 mg Intravenous Q12H Noemi Chapel, MD      . pravastatin (PRAVACHOL) tablet 80 mg  80 mg Oral q1800 Allyne Gee, MD   80 mg at 06/01/15 2133  . [START ON 06/03/2015] predniSONE (DELTASONE) tablet 10 mg  10 mg Oral Q breakfast Allyne Gee, MD       Followed by  . [START ON 06/07/2015] predniSONE (DELTASONE) tablet 5 mg  5 mg Oral Q breakfast Allyne Gee, MD      . saccharomyces boulardii (FLORASTOR) capsule 250 mg  250 mg Oral Daily Allyne Gee, MD      . sodium chloride 0.9 % injection 3 mL  3 mL Intravenous Q12H Allyne Gee, MD   3 mL at 06/01/15 2133  . spironolactone (ALDACTONE) tablet 12.5 mg  12.5 mg Oral Daily Allyne Gee, MD      . thiamine (VITAMIN B-1) tablet 100 mg  100 mg Oral Daily Allyne Gee, MD   100 mg at 06/01/15 2133    Allergies as of 06/01/2015 - Review Complete 06/01/2015  Allergen Reaction Noted  . Sulfa antibiotics Other (See Comments) 06/16/2014  . Aleve [naproxen sodium] Other (See Comments) 07/29/2014  . Rofecoxib Other (See Comments)   . Sulfamethoxazole-trimethoprim Other (See Comments) 06/09/2009  . Tape Other (See Comments) 02/03/2015  . Tramadol hcl Other (See Comments) 12/07/2008  . Neomycin-bacitracin zn-polymyx Rash 06/16/2014  . Neosporin [neomycin-polymyxin-gramicidin] Rash 12/31/2011  . Penicillins Swelling  and Rash     Family History  Problem Relation Age of Onset  . Thyroid cancer Mother   . Diabetes Father   . Heart disease Father   . Stroke Brother     cerebral hemm  . Colon cancer Brother   . Anesthesia problems Neg Hx   . Hypotension Neg Hx   . Malignant hyperthermia Neg Hx   . Pseudochol deficiency Neg Hx   .  Prostate cancer Neg Hx   . Heart attack Father     History   Social History  . Marital Status: Widowed    Spouse Name: N/A  . Number of Children: 2  . Years of Education: college   Occupational History  . Retired     1996 [employer]   Social History Main Topics  . Smoking status: Former Smoker -- 2.00 packs/day for 20 years    Types: Cigarettes, Cigars    Quit date: 09/27/1967  . Smokeless tobacco: Never Used  . Alcohol Use: No  . Drug Use: No  . Sexual Activity: Not on file   Other Topics Concern  . Not on file   Social History Narrative   Widowed 2013 after 63+ years of marriage, Wife had CHF and multiple myeloma   [name], daughter in Sports coach, lives with patient   Patient does not get regular exercise   No caffeine   Retired from [employer] ([employer]), retired from [employer]    Patient is left handed.    Review of Systems: Pertinent positive and negative review of systems were noted in the above HPI section.  All other review of systems was otherwise negative.Marland Kitchen  Physical Exam: Vital signs in last 24 hours: Temp:  [97.3 F (36.3 C)-98.4 F (36.9 C)] 98.4 F (36.9 C) (06/10 0800) Pulse Rate:  [55-64] 59 (06/10 0800) Resp:  [13-24] 16 (06/10 0800) BP: (100-141)/(44-71) 117/44 mmHg (06/10 0800) SpO2:  [90 %-100 %] 96 % (06/10 0800) Weight:  [205 lb 4 oz (93.1 kg)] 205 lb 4 oz (93.1 kg) (06/09 2055) Last BM Date: 06/01/15 General:   Alert,  Well-developed, well-nourished,elderly  pleasant and cooperative in NAD Head:  Normocephalic and atraumatic. Eyes:  Sclera clear, no icterus.   Conjunctiva pale. Ears:  Normal auditory  acuity. Nose:  No deformity, discharge,  or lesions. Mouth:  No deformity or lesions.   Neck:  Supple; no masses or thyromegaly. Lungs:  Clear throughout to auscultation.   No wheezes, crackles, or rhonchi. Heart:  Regular rate and rhythm; no murmurs, clicks, rubs,  or gallops. Abdomen:  Soft,nontender, fullness in upper abdomen, BS+ .   Rectal:  Deferred  Msk:  Symmetrical without gross deformities. . Pulses:  Normal pulses noted. Extremities: bilat edema Neurologic:  Alert and  oriented x4;  grossly normal neurologically. Skin:  Intact without significant lesions or rashes.. Psych:  Alert and cooperative. Normal mood and affect.  Intake/Output from previous day: 06/09 0701 - 06/10 0700 In: 1847.1 [I.V.:1037.1; Blood:810] Out: 1125 [Urine:1125] Intake/Output this shift: Total I/O In: 200 [I.V.:200] Out: -   Lab Results:  Recent Labs  06/01/15 1755 06/02/15 0500  WBC 12.1* 11.0*  HGB 6.6* 8.1*  HCT 21.3* 24.9*  PLT 132* 94*   BMET  Recent Labs  06/01/15 1755 06/02/15 0500  NA 132* 133*  K 4.8 4.1  CL 102 101  CO2 20* 23  GLUCOSE 155* 141*  BUN 29* 26*  CREATININE 0.89 0.86  CALCIUM 8.0* 8.0*   LFT  Recent Labs  06/02/15 0500  PROT 5.1*  ALBUMIN 2.4*  AST 45*  ALT 69*  ALKPHOS 376*  BILITOT 2.9*   PT/INR  Recent Labs  06/02/15 0500  LABPROT 16.8*  INR 1.35    Studies/Results:none   IMPRESSION:  #56 81 year old white male with duodenal adenocarcinoma metastatic to the liver with chronic ongoing GI blood loss secondary to duodenal tumor who presented with melena 48 hours  and increased weakness. He may have had more active bleeding which has now subsided. Large obstructing duodenal cancer is not amenable to endoscopic therapy and decision had been made in April 2016 not to pursue further endoscopies. He has small varices but this is not the source of his GI bleeding. 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 Patient requests something to help him rest at nighttime and during the day we'll order a very low dose Ativan when necessary. We'll defer to oncology regarding pursuing hospice at this time.   Amy Esterwood  06/02/2015, 9:53 AM     Attending physician's note   I have taken a history, examined the patient and reviewed the chart. I agree with the Advanced Practitioner's note, impression and recommendations. Duodenal adenocarcinoma metastatic to the liver with ongoing, chronic GI blood loss managed by intermittent transfusion. Bleeding malignant GI tumors are not amenable to endoscopic therapy. Varices are not the source of his ongoing chronic blood loss. Continue transfusions to Hb > 9 for now. No plans for EGD. Unfortunately he now has BLE DVTs. Long term plans per Oncology. GI signing off.   Ladene Artist, MD Mar[facility] Regal

## 2015-06-02 NOTE — Care Management Note (Signed)
Case Management Note  Patient Details  Name: James [name] MRN: [id number] Date of Birth: Apr 27, 1928  Subjective/Objective:              GI bleed with hgb of 6.6 requiring egd and transfusion.  Patient with hx of gastric ca      Action/Plan:  home   Expected Discharge Date:   (unknown)              [id number] Expected Discharge Plan:  Home/Self Care  In-House Referral:  NA  Discharge planning Services  CM Consult  Post Acute Care Choice:  NA Choice offered to:  NA  DME Arranged:  N/A DME Agency:  NA  HH Arranged:  NA HH Agency:  NA  Status of Service:  In process, will continue to follow  Medicare Important Message Given:    Date Medicare IM Given:    Medicare IM give by:    Date Additional Medicare IM Given:    Additional Medicare Important Message give by:     If discussed at [place] of Stay Meetings, dates discussed:    Additional Comments:  Leeroy Cha, RN 06/02/2015, 10:16 AM

## 2015-06-02 NOTE — Progress Notes (Addendum)
*  PRELIMINARY RESULTS* Vascular Ultrasound Lower extremity venous duplex has been completed.  Preliminary findings: DVT noted in the Right distal femoral vein, Right gastroc veins, Right peroneal veins, Right soleal vein, Left femoral vein, Left popliteal vein, Left gastroc veins, Left proximal peroneal veins, and Left mid posterior tibial veins. Superficial thrombosis of the left lesser saphenous vein.  Landry Mellow, RDMS, RVT  06/02/2015, 9:46 AM

## 2015-06-02 NOTE — Progress Notes (Signed)
Patient Demographics  James [name], is a 81 y.o. male, DOB - November 05, 1928, TMA:[id number]  Admit date - 06/01/2015   Admitting Physician Allyne Gee, MD  Outpatient Primary MD for the patient is [name], MD  LOS - 1   Chief Complaint  Patient presents with  . Weakness  . 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 He was found to have small to medium esophageal varices and a very large circumferential ulcerated mass in the duodenal bulb. This is not amenable to endoscopic therapy. Presents with melena, found to have hemoglobin of 6.6, transfuse 2 units, unfortunately his venous Doppler is positive for DVT.  Subjective:   James [name] today has, No headache, No chest pain, No abdominal pain - No Nausea, reports generalized weakness , but he feels better than yesterday . Assessment & Plan    Principal Problem:   Symptomatic anemia Active Problems:   OBESITY, MORBID   HYPERTENSION, BENIGN   HLD (hyperlipidemia)   Chronic diastolic CHF (congestive heart failure)   GI bleed   Gastrointestinal hemorrhage with melena  Symptomatic anemia secondary to GI bleed : - Transfused 2 units PRBC overnight , transfuse another unit , goal is to keep hemoglobin > 9 .  Upper GI bleed  - Patient had slow ongoing chronic GI bleed secondary to metastatic duodenal adenocarcinoma bleed , this is unlikely related to [facility] bleed , octreotide stopped by GI , no indication for EGD at this point . - Monitor hemoglobin closely and keep > 9   Acute DVT  - At this point patient is contraindicated for anticoagulation , IR consulted for IVC filter .  Chronic diastolic CHF - Last EF 25-63% -  Appears compensated, continue with Lasix and Aldactone given multiple transfusions.  Duodenal Adenocarcinoma with metastasis to liver - Oncology consulted       Code Status:  full  Family Communication: none at bedside  Disposition Plan: Pending further workup   Procedures  2units PRBC transfusion 6/9   Consults   GI  IR   Medications  Scheduled Meds: . sodium chloride   Intravenous Once  . antiseptic oral rinse  7 mL Mouth Rinse q12n4p  . chlorhexidine  15 mL Mouth Rinse BID  . cholecalciferol  2,000 Units Oral Daily  . DULoxetine  30 mg Oral BID  . feeding supplement (ENSURE ENLIVE)  237 mL Oral TID BM  . finasteride  5 mg Oral Daily  . folic acid  1 mg Oral Daily  . furosemide  20 mg Oral Daily  . hydrocortisone-pramoxine  1 applicator Rectal BID  . insulin aspart  0-15 Units Subcutaneous TID WC  . iron polysaccharides  150 mg Oral BID  . levothyroxine  50 mcg Oral QAC breakfast  . multivitamin with minerals  1 tablet Oral Daily  . octreotide  50 mcg Intravenous Once  . [START ON 06/05/2015] pantoprazole (PROTONIX) IV  40 mg Intravenous Q12H  . pravastatin  80 mg Oral q1800  . [START ON 06/03/2015] predniSONE  10 mg  Oral Q breakfast   Followed by  . [START ON 06/07/2015] predniSONE  5 mg Oral Q breakfast  . saccharomyces boulardii  250 mg Oral Daily  . sodium chloride  3 mL Intravenous Q12H  . spironolactone  12.5 mg Oral Daily  . thiamine  100 mg Oral Daily   Continuous Infusions: . sodium chloride 50 mL/hr at 06/02/15 0600   PRN Meds:.benzonatate, dibucaine, LORazepam, nitroGLYCERIN, ondansetron **OR** ondansetron (ZOFRAN) IV, oxyCODONE-acetaminophen  DVT Prophylaxis  No chemical anticoagulation due to GI bleed, no SCD due to acute DVT.  Lab Results  Component Value Date   PLT 94* 06/02/2015    Antibiotics    Anti-infectives    Start     Dose/Rate Route Frequency Ordered Stop   06/01/15 2100  fluconazole (DIFLUCAN) tablet 100 mg  Status:   Discontinued     100 mg Oral Daily 06/01/15 2050 06/01/15 2108          Objective:   Filed Vitals:   06/02/15 0056 06/02/15 0250 06/02/15 0400 06/02/15 0800  BP: 130/56 110/51 119/47 117/44  Pulse: 59 58 56 59  Temp: 97.3 F (36.3 C) 97.3 F (36.3 C) 98.4 F (36.9 C) 98.4 F (36.9 C)  TempSrc: Oral Oral Oral Oral  Resp: 16 14 13 16   Height:      Weight:      SpO2: 97% 96% 96% 96%    Wt Readings from Last 3 Encounters:  06/01/15 93.1 kg (205 lb 4 oz)  05/29/15 92.534 kg (204 lb)  05/25/15 92.534 kg (204 lb)     Intake/Output Summary (Last 24 hours) at 06/02/15 1131 Last data filed at 06/02/15 1100  Gross per 24 hour  Intake 2247.08 ml  Output   1725 ml  Net 522.08 ml     Physical Exam  Awake Alert, Oriented X 3, No new F.N deficits, Normal affect [place].AT,PERRAL Supple Neck,No JVD, No cervical lymphadenopathy appriciated.  Symmetrical Chest wall movement, Good air movement bilaterally,  RRR,No Gallops,Rubs or new Murmurs, No Parasternal Heave +ve B.Sounds, Abd Soft, No tenderness, No organomegaly appriciated, No rebound - guarding or rigidity. No Cyanosis, B/L  edema, No new Rash or bruise    Data Review   Micro Results No results found for this or any previous visit (from the past 240 hour(s)).  Radiology Reports Dg Chest 2 View  05/16/2015   CLINICAL DATA:  Shortness of breath with fever and chills. Metastatic duodenum cancer  EXAM: CHEST  2 VIEW  COMPARISON:  May 15, 2015  FINDINGS: Port-A-Cath tip is in the superior vena cava. No pneumothorax. There is no edema or consolidation. There is slight bibasilar lung scarring. Heart is upper limits normal in size with pulmonary vascularity within normal limits. No adenopathy. No bone lesions.  IMPRESSION: Slight bibasilar lung scarring.  No edema or consolidation.   Electronically Signed   By: Lowella Grip III M.D.   On: 05/16/2015 11:46   Dg Chest 2 View  05/15/2015   CLINICAL DATA:  Pt from home,  complains of emesis and dizziness, denies weakness. Hx colon cancer.  EXAM: CHEST  2 VIEW  COMPARISON:  Cough 04/12/2015  FINDINGS: Cardiac silhouette mildly enlarged. No mediastinal or hilar masses or evidence of adenopathy.  Minor reticular scarring in the antral lateral lung bases. This is unchanged. No lung consolidation or edema. No pleural effusion or pneumothorax.  Right anterior chest wall Port-A-Cath is stable.  Bony thorax is intact.  IMPRESSION: No acute cardiopulmonary disease.  Electronically Signed   By: Lajean Manes M.D.   On: 05/15/2015 17:08     CBC  Recent Labs Lab 06/01/15 1755 06/02/15 0500  WBC 12.1* 11.0*  HGB 6.6* 8.1*  HCT 21.3* 24.9*  PLT 132* 94*  MCV 94.2 92.9  MCH 29.2 30.2  MCHC 31.0 32.5  RDW 24.2* 21.2*    Chemistries   Recent Labs Lab 06/01/15 1755 06/02/15 0500  NA 132* 133*  K 4.8 4.1  CL 102 101  CO2 20* 23  GLUCOSE 155* 141*  BUN 29* 26*  CREATININE 0.89 0.86  CALCIUM 8.0* 8.0*  AST 61* 45*  ALT 70* 69*  ALKPHOS 380* 376*  BILITOT 1.9* 2.9*   ------------------------------------------------------------------------------------------------------------------ estimated creatinine clearance is 70.2 mL/min (by C-G formula based on Cr of 0.86). ------------------------------------------------------------------------------------------------------------------ No results for input(s): HGBA1C in the last 72 hours. ------------------------------------------------------------------------------------------------------------------ No results for input(s): CHOL, HDL, LDLCALC, TRIG, CHOLHDL, LDLDIRECT in the last 72 hours. ------------------------------------------------------------------------------------------------------------------ No results for input(s): TSH, T4TOTAL, T3FREE, THYROIDAB in the last 72 hours.  Invalid input(s):  FREET3 ------------------------------------------------------------------------------------------------------------------ No results for input(s): VITAMINB12, FOLATE, FERRITIN, TIBC, IRON, RETICCTPCT in the last 72 hours.  Coagulation profile  Recent Labs Lab 06/02/15 0500  INR 1.35    No results for input(s): DDIMER in the last 72 hours.  Cardiac Enzymes No results for input(s): CKMB, TROPONINI, MYOGLOBIN in the last 168 hours.  Invalid input(s): CK ------------------------------------------------------------------------------------------------------------------ Invalid input(s): POCBNP     Time Spent in minutes   35 minutes   Meoshia Billing M.D on 06/02/2015 at 11:31 AM  Between 7am to 7pm - Pager - [phone]  After 7pm go to [email or web address] - password [facility]  [facility]   Office  [phone]

## 2015-06-02 NOTE — Progress Notes (Signed)
James [name]   DOB:[birth year]   LK#:[id number]   TDS#:[id number]  Patient Care Team: Tonia Ghent, MD as PCP - General [name], MD as Consulting Physician (Pulmonary Disease) Larey Dresser, MD as Consulting Physician (Cardiology) Annia Belt, MD as Consulting Physician (Oncology) Gatha Mayer, MD as Consulting Physician (Gastroenterology)  Subjective: 81 year old man with a history of metastatic duodenal carcinoma as listed below, admitted on 6/9 with 2 day history of black tools following a recent hospital visit for pneumonia. He was receiving FOLFOX until 5/3, with last cycle placed on hold due to poor performance status, anemia requiring transfusions due to GI bleed. Denies fevers, chills, night sweats, vision changes, or mucositis. Denies any respiratory complaints. Denies any chest pain or palpitations.Denies nausea, heartburn . No further bleeding since admission. Denies abnormal skin rashes, or neuropathy. He has significant bruising Denies any bleeding issues such as epistaxis, hematemesis, hematuria. During exam he was noted to have bilateral lower extremity swelling, with dopplers positive for DVT, recurrent. He was on no anticoagulation on admission due to GIB. He is to undergo IVC filter placement by IR later today   Summary of Oncological History 1.Duodenal mass, abdominal/retroperitoneal lymphadenopathy, and liver metastases noted on a CT 03/01/2015  Upper endoscopy 03/03/2015 confirmed a duodenal mass, status post a biopsy with the final pathology confirming a carcinoma, immunohistochemical stains negative for lymphoma, neuroendocrine carcinoma, and melanoma markers. Positive staining for CDX2, cytokeratin AE1/AE3, and CD10  PET scan 03/29/2015 showed a large intensely hypermetabolic mass circumferentially narrowing the second portion of the duodenum. Adjacent hypermetabolic lymph nodes medial to the duodenum mass. Irregular hypermetabolic nodal mass in the  inferior aspect of the gastrohepatic ligament. Hypermetabolic nodal metastasis in the retroperitoneum left of the aorta. No abnormal metabolic activity within the enlarged inferior right lobe of the liver.  Cycle 1 FOLFOX 04/11/2015  Cycle 2 FOLFOX 04/25/2015  2. Gastric outlet obstruction secondary to #1, status post a palliative gastrojejunostomy 03/09/2015  3. 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 Portal vein thrombosis diagnosed in September 2015  5. Left leg DVT December 2014  6. History of coronary artery disease, status post an LAD drug-eluting stent July 2015  7. Remote history of melanoma of the right ear  8. 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 History of drug related ITP  10. Abdominal pain secondary to the duodenal mass/liver metastases/adenopathy  11. Neuropathy  12. Altered mental status 04/12/2015-resolved  13. External hemorrhoid-he will continue hemorrhoid creams, I will refer him to Dr. Excell Seltzer   Scheduled Meds: . sodium chloride   Intravenous Once  . antiseptic oral rinse  7 mL Mouth Rinse q12n4p  . chlorhexidine  15 mL Mouth Rinse BID  . cholecalciferol  2,000 Units Oral Daily  . DULoxetine  30 mg Oral BID  . feeding supplement (ENSURE ENLIVE)  237 mL Oral TID BM  . finasteride  5 mg Oral Daily  . folic acid  1 mg Oral Daily  . furosemide  20 mg Oral Daily  . hydrocortisone-pramoxine  1 applicator Rectal BID  . insulin aspart  0-15 Units Subcutaneous TID WC  . iron polysaccharides  150 mg Oral BID  . levothyroxine  50 mcg Oral QAC breakfast  . multivitamin with minerals  1  tablet Oral Daily  . octreotide  50 mcg Intravenous Once  . [START ON 06/05/2015] pantoprazole  (PROTONIX) IV  40 mg Intravenous Q12H  . pravastatin  80 mg Oral q1800  . saccharomyces boulardii  250 mg Oral Daily  . sodium chloride  3 mL Intravenous Q12H  . spironolactone  12.5 mg Oral Daily  . thiamine  100 mg Oral Daily   Continuous Infusions: . sodium chloride 50 mL/hr at 06/02/15 0600   PRN Meds:.benzonatate, dibucaine, LORazepam, nitroGLYCERIN, ondansetron **OR** ondansetron (ZOFRAN) IV, oxyCODONE-acetaminophen  Objective:  Filed Vitals:   06/02/15 0800  BP: 117/44  Pulse: 59  Temp: 98.4 F (36.9 C)  Resp: 16      Intake/Output Summary (Last 24 hours) at 06/02/15 1203 Last data filed at 06/02/15 1100  Gross per 24 hour  Intake 2247.08 ml  Output   1725 ml  Net 522.08 ml      GENERAL:alert, no distress and comfortable SKIN: multiple areas of ecchymoses in his arms EYES: normal, conjunctiva are pink and non-injected, sclera clear OROPHARYNX:no exudate, no erythema and lips, buccal mucosa, and tongue normal  NECK: supple, thyroid normal size, non-tender, without nodularity LYMPH:  no palpable lymphadenopathy in the cervical, axillary or inguinal LUNGS: clear to auscultation and percussion with normal breathing effort HEART: regular rate & rhythm and no murmurs and tense bilateral lower extremity edema ABDOMEN: soft, non-tender and normal bowel sounds Musculoskeletal:no cyanosis of digits and no clubbing  PSYCH: alert & oriented x 3 with fluent speech NEURO: no focal motor/sensory deficits    CBG (last 3)   Recent Labs  06/01/15 2225 06/02/15 0801 06/02/15 1143  GLUCAP 169* 104* 125*     Labs:   Recent Labs Lab 06/01/15 1755 06/02/15 0500  WBC 12.1* 11.0*  HGB 6.6* 8.1*  HCT 21.3* 24.9*  PLT 132* 94*  MCV 94.2 92.9  MCH 29.2 30.2  MCHC 31.0 32.5  RDW 24.2* 21.2*     Chemistries:    Recent Labs Lab 06/01/15 1755 06/02/15 0500  NA 132* 133*  K 4.8 4.1  CL 102 101  CO2 20* 23  GLUCOSE 155* 141*  BUN 29* 26*  CREATININE 0.89  0.86  CALCIUM 8.0* 8.0*  AST 61* 45*  ALT 70* 69*  ALKPHOS 380* 376*  BILITOT 1.9* 2.9*    GFR Estimated Creatinine Clearance: 70.2 mL/min (by C-G formula based on Cr of 0.86).  Liver Function Tests:  Recent Labs Lab 06/01/15 1755 06/02/15 0500  AST 61* 45*  ALT 70* 69*  ALKPHOS 380* 376*  BILITOT 1.9* 2.9*  PROT 5.2* 5.1*  ALBUMIN 2.5* 2.4*   Urine Studies     Component Value Date/Time   COLORURINE YELLOW 05/15/2015 1742   APPEARANCEUR CLEAR 05/15/2015 1742   LABSPEC 1.007 05/15/2015 1742   PHURINE 6.5 05/15/2015 1742   GLUCOSEU NEGATIVE 05/15/2015 1742   HGBUR MODERATE* 05/15/2015 1742   BILIRUBINUR NEGATIVE 05/15/2015 1742   KETONESUR NEGATIVE 05/15/2015 1742   PROTEINUR NEGATIVE 05/15/2015 1742   UROBILINOGEN 0.2 05/15/2015 1742   NITRITE NEGATIVE 05/15/2015 1742   LEUKOCYTESUR NEGATIVE 05/15/2015 1742    Coagulation profile  Recent Labs Lab 06/02/15 0500  INR 1.35    CBG:  Recent Labs Lab 06/01/15 2225 06/02/15 0801 06/02/15 1143  GLUCAP 169* 104* 125*      Imaging Studies:  No results found.  Assessment/Plan: 81 y.o.  Metastatic duodenal carcinoma S/p Cycle 2 FOLFOX 04/25/2015, cycle 3 on hold due to GIB and  poor  performance status  History of GI bleed He presented with 2 day history of black stools Supportive therapy was provided bi GI team, with PPI, full liquid diet, octreotide dc's No plans for EGD May need radiation oncology evaluation  Lower Extremity DVT History of Portal vein thrombosis diagnosed in September 2015 History of  Left leg DVT December 2014 IR to proceed with IVC filter placement today   History of coronary artery disease, status post an LAD drug-eluting stent July 2015   Remote history of melanoma of the right ear   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  Abdominal pain secondary to the duodenal mass/liver metastases/adenopathy  Full Code  Other medical issues as per admitting team     [place], PA-C 06/02/2015  12:03 PM    ADDENDUM:  I saw and examined the patient. He had a filter placed because of the left lower extremity DVT. He is not a candidate for anticoagulation.  The main issue right now is the GI bleeding. He is on octreotide.  He was recently hospitalized back in late May. His certainly appears that GI bleeding will be his biggest issue. He has variceal bleeding.  We will help as much as we can right now. He has relatively decent coagulation parameters. His plate account is mildly suppressed but I don't think this really would be a factor with bleeding. He has decent renal function.  He is not keen treated for his metastatic bowel cancer.  Probably would check an ammonia level on him.  For now, there is not much that we have to do. He does have metastatic disease but his problem this admission is the GI bleeding which I don't think is from his underlying malignancy.  I will let Dr. Benay Spice know of his admission for when he returns on Monday.  Thank so much for the great care that he is getting in the ICU.  Pete E.

## 2015-06-02 NOTE — Procedures (Signed)
Interventional Radiology Procedure Note  Procedure:  IVC Filter Placement  Complications: None  Estimated Blood Loss: <10 mL  Findings:  IVC normal in caliber and widely patent.  Bard [place] IVC filter placed in infrarenal IVC.  Recommendations:  This will likely be a permanent filter given history of malignancy, poor performance status and patient's age.  [name]. Kathlene Cote, M.D Pager:  [phone]

## 2015-06-02 NOTE — Progress Notes (Signed)
Pt has pt and rn from [place].

## 2015-06-02 NOTE — Progress Notes (Signed)
Initial Nutrition Assessment  INTERVENTION: Once diet advanced add:  Ensure Enlive po TID, each supplement provides 350 kcal and 20 grams of protein  Magic cup BID between meals, each supplement provides 290 kcal and 9 grams of protein  RD will continue to monitor  NUTRITION DIAGNOSIS:  Inadequate oral intake related to inability to eat as evidenced by NPO status.  GOAL:  Patient will meet greater than or equal to 90% of their needs  MONITOR:  Diet advancement, Labs, Weight trends  REASON FOR ASSESSMENT:  Malnutrition Screening Tool    ASSESSMENT: 81 y.o. male with history of hypertension hyperlipidemia chronic anemia small bowel cancer esophageal varices presents with GI bleed. He had a duodenal tumor which is being followed by oncology. Patient also has a history of portal vein clot possibly. He had a PET scan done which shows multiple nodal involvement and also with second portion of the duodenum involved suggestive of adenocarcinoma.  - Pt asleep during RD visit. Daughter requested that he not be woken up.  - Nutritional history obtained from daughter, medical chart, and RN.  - Pt lost 85-90 lbs within the past year, but has started to gain weight back over the past couple of weeks due to increased po intake. Pt has been drinking Ensure Enlive at home three times daily. Daughter requested Ensure Jeanne Ivan and [employer] during hospital stay.  - Pt also with history of oral thrush which has since cleared up.  - Nutrition-focused physical exam not completed at this time per family request.  - RD to order nutritional supplements.  - labs and medications reviewed  Na 132  BUN 29  Albumin 2.5   Height:  Ht Readings from Last 1 Encounters:  06/01/15 6' (1.829 m)    Weight:  Wt Readings from Last 1 Encounters:  06/01/15 205 lb 4 oz (93.1 kg)    Ideal Body Weight:  80.9 kg  Wt Readings from Last 10 Encounters:  06/01/15 205 lb 4 oz (93.1 kg)  05/29/15 204 lb (92.534  kg)  05/25/15 204 lb (92.534 kg)  05/24/15 206 lb 8 oz (93.668 kg)  05/23/15 204 lb (92.534 kg)  05/19/15 214 lb 11.7 oz (97.4 kg)  05/09/15 198 lb (89.812 kg)  04/24/15 218 lb 12 oz (99.224 kg)  04/17/15 209 lb (94.802 kg)  04/05/15 207 lb (93.895 kg)    BMI:  Body mass index is 27.83 kg/(m^2).  Estimated Nutritional Needs:  Kcal:  2200-2400  Protein:  120-130 g  Fluid:  2.3 L/day  Skin:  Wound (see comment) (deep tissue injury on sacrum)  Diet Order:  Diet NPO time specified  EDUCATION NEEDS:  Education needs addressed   Intake/Output Summary (Last 24 hours) at 06/02/15 1323 Last data filed at 06/02/15 1100  Gross per 24 hour  Intake 2247.08 ml  Output   1725 ml  Net 522.08 ml    Last BM:  6/9  Laurette Schimke [place], [place], [place]

## 2015-06-02 NOTE — Consult Note (Signed)
Reason for consult: IVC filter placement  Referring Physician(s): TRH  History of Present Illness: James [name] is a 81 y.o. 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 He was recently admitted with weakness and black stools of approximately 2 days duration. Patient has known history of esophageal varices with an ulcerated duodenal bulb mass as well as ongoing slow GI blood loss requiring periodic transfusions. He has now been diagnosed with bilateral lower extremity DVTs and request has now been received for IVC filter placement. He currently denies headache, chest pain, abdominal pain, or nausea.  Past Medical History  Diagnosis Date  . Bradycardia     Hypertensive  . carotid bruit, right   . hypercholesterolemia     Trig 234/293;takes Simcor daily  . Obesity, morbid   . Vasculitis   . 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 Beryle Beams; Dr. Beryle Beams , normal platelets  . Dermatitis     legs  . Bleeding gums   . Chronic low back pain   . Myalgia and myositis   . Unspecified vitamin D deficiency   . Bladder diverticulum   . Primary osteoarthritis of both knees   . Nephrolithiasis   . History of elevated glucose   . Thrombophlebitis     right forearm  . History of BPH   . History of colonic polyps     [name]  . Epididymitis, left     S/P Epidimectomy  . Splenomegaly 05/15/09    abd U/S NML  . Hypertension, benign     takes Micardis daily  . Neuropathy     acute  . Bruises easily   . H/O hiatal hernia   . GERD (gastroesophageal reflux disease)     hx of but doesn't take any meds now  . Urinary frequency   . Urinary urgency   . Nocturia   . Urinary leakage   . Abnormality of gait 03/19/2013  . DVT of lower limb, acute 11/19/2013    Peroneal & distal tibial left 11/01/13  . Polio 1941    "mild case"  . CHF (congestive heart failure)     "low  grade" (06/22/2014)  . DVT (deep venous thrombosis) 10/2013    LLE  . Pneumonia, bacterial     RML resolved, spirometry, normal  . Pneumonia 1933  . History of blood transfusion 05/2014  . Anemia 05/2014    "related to Xarelto"  . Iron deficiency anemia 05/2014    "got iron infusion"  . Arthritis     "all over"  . Gout   . Malignant melanoma of ear     right  . Thrombophlebitis of superficial veins of right lower extremity 06/23/2014    Incidental finding doppler 05/30/14  . Shortness of breath   . Esophageal varices without mention of bleeding 08/08/2014  . Portal vein thrombosis   . Duodenal cancer     Past Surgical History  Procedure Laterality Date  . Cystoscopy w/ stone manipulation  1950's  . Fem cutaneous nerve entrapment  1977    due to surgery (mild lat anesth of bilat legs)  . Limited pelvis/hip bone scan  04/99    negative  . Bone scan  04/00  . Cataract extraction w/ intraocular lens  implant, bilateral Bilateral ~ 2004  . Melanoma excision Right 06/04    ear; Wide local excision,,with flap  . Stress cardiolite  05/11/04  mild inf. ischemia, EF 71%  . Shoulder surgery Left 06/17/2005    "tore it all to pieces when I fell; not fractured"  . Bronchoscopy  09/08    RML collapse with chronic pneumonia, bx neg (Dr. Joya Gaskins)  . Cyst excision Right 03/07/09    middle finger, DIP Joint Mucoid (Dr. Fredna Dow)  . Eyelid & eyebrow lift  1996  . Cyst excision Right 08/15/09    middle finger, DIP Joint Mucoid Cyst (Dr. Fredna Dow)  . Appendectomy  1950  . Excision of mucoid tumor    . Colonoscopy    . Total knee arthroplasty  01/10/2012    Procedure: TOTAL KNEE ARTHROPLASTY;  Surgeon: Augustin Schooling, MD;  Location: [place];  Service: Orthopedics;  Laterality: Left;  . Surgery scrotal / testicular Left ~ 2000    removal of mass, noncancerous  . Cardiac catheterization  05/23/04    mild plague-statin  . Coronary angioplasty with stent placement  06/22/2014    to proximal LAD  .  Inguinal hernia repair Bilateral 1959  . Inguinal hernia repair Left 1970's  . Esophagogastroduodenoscopy N/A 08/08/2014    Procedure: ESOPHAGOGASTRODUODENOSCOPY (EGD);  Surgeon: Gatha Mayer, MD;  Location: [facility] ENDOSCOPY;  Service: Endoscopy;  Laterality: N/A;  . Colonoscopy N/A 08/08/2014    Procedure: COLONOSCOPY;  Surgeon: Gatha Mayer, MD;  Location: [place];  Service: Endoscopy;  Laterality: N/A;  . Flexible sigmoidoscopy N/A 09/21/2014    Procedure: FLEXIBLE SIGMOIDOSCOPY;  Surgeon: Jerene Bears, MD;  Location: [place];  Service: Gastroenterology;  Laterality: N/A;  . Givens capsule study N/A 09/21/2014    Procedure: GIVENS CAPSULE STUDY;  Surgeon: Jerene Bears, MD;  Location: [facility] ENDOSCOPY;  Service: Endoscopy;  Laterality: N/A;  . Left and right heart catheterization with coronary angiogram N/A 06/22/2014    Procedure: LEFT AND RIGHT HEART CATHETERIZATION WITH CORONARY ANGIOGRAM;  Surgeon: Larey Dresser, MD;  Location: [facility] CATH LAB;  Service: Cardiovascular;  Laterality: N/A;  . Percutaneous coronary stent intervention (pci-s)  06/22/2014    Procedure: PERCUTANEOUS CORONARY STENT INTERVENTION (PCI-S);  Surgeon: Larey Dresser, MD;  Location: [facility] CATH LAB;  Service: Cardiovascular;;  . Colonoscopy N/A 01/25/2015    Procedure: COLONOSCOPY;  Surgeon: Gatha Mayer, MD;  Location: [facility] ENDOSCOPY;  Service: Endoscopy;  Laterality: N/A;  . Cystoscopy with biopsy Right 02/03/2015    Procedure: CYSTOSCOPY WITH COLD CUP BLADDER BIOPSY CAUTHERIZAITON OF RIGHT BLADDER CANCER AND SATILITE, TURBT RIGHT BLADDER BASE;  Surgeon: Ailene Rud, MD;  Location: [facility] ORS;  Service: Urology;  Laterality: Right;  . Esophagogastroduodenoscopy N/A 03/03/2015    Procedure: ESOPHAGOGASTRODUODENOSCOPY (EGD);  Surgeon: Jerene Bears, MD;  Location: [facility] ENDOSCOPY;  Service: Gastroenterology;  Laterality: N/A;  . Gastrojejunostomy N/A 03/09/2015    Procedure: LAPAROSCOPIC GASTROJEJUNOSTOMY;  Surgeon: Excell Seltzer, MD;  Location: [facility] ORS;  Service: General;  Laterality: N/A;  . Portacath placement Right 04/05/2015    Procedure: INSERTION PORT-A-CATH;  Surgeon: Excell Seltzer, MD;  Location: [place];  Service: General;  Laterality: Right;  . Esophagogastroduodenoscopy N/A 04/14/2015    Procedure: ESOPHAGOGASTRODUODENOSCOPY (EGD);  Surgeon: Jerene Bears, MD;  Location: [facility] ENDOSCOPY;  Service: Endoscopy;  Laterality: N/A;  bedside case    Allergies: Sulfa antibiotics; Aleve; Rofecoxib; Sulfamethoxazole-trimethoprim; Tape; Tramadol hcl; Neomycin-bacitracin zn-polymyx; Neosporin; and Penicillins  Medications: Prior to Admission medications   Medication Sig Start Date End Date Taking? Authorizing Provider  benzonatate (TESSALON) 200 MG capsule Take 1 capsule (200 mg total)  by mouth 3 (three) times daily as needed. 04/24/15  Yes Tonia Ghent, MD  Cholecalciferol (VITAMIN D3) 2000 UNITS capsule Take 2,000 Units by mouth every morning.    Yes Historical Provider, MD  dibucaine (NUPERCAINAL) 1 % ointment Apply topically 3 (three) times daily as needed for pain. 04/24/15  Yes Tonia Ghent, MD  DULoxetine (CYMBALTA) 30 MG capsule One capsule daily for one week, then take one capsule twice a day 05/29/15  Yes Kathrynn Ducking, MD  feeding supplement, ENSURE ENLIVE, (ENSURE ENLIVE) LIQD Take 237 mLs by mouth 3 (three) times daily between meals. 03/16/15  Yes Debbe Odea, MD  finasteride (PROSCAR) 5 MG tablet Take 5 mg by mouth daily.   Yes Historical Provider, MD  furosemide (LASIX) 40 MG tablet Take 0.5 tablets (20 mg total) by mouth daily. 04/17/15  Yes Oswald Hillock, MD  hydrocortisone (ANUSOL-HC) 2.5 % rectal cream Apply topically 2 (two) times daily. Patient taking differently: Apply topically 2 (two) times daily as needed for hemorrhoids or itching.  04/17/15  Yes Oswald Hillock, MD  levothyroxine (SYNTHROID, LEVOTHROID) 50 MCG tablet Take 1 tablet (50 mcg total) by mouth daily before  breakfast. 05/19/15  Yes Donne Hazel, MD  lidocaine-prilocaine (EMLA) cream Apply 1 application topically as needed. Patient taking differently: Apply 1 application topically as needed (to port site.).  04/06/15  Yes Owens Shark, NP  LIVALO 4 MG TABS TAKE 1 TABLET AT BEDTIME 10/03/14  Yes Jolaine Artist, MD  loperamide (IMODIUM A-D) 2 MG tablet Take 1 tablet (2 mg total) by mouth 2 (two) times daily as needed for diarrhea or loose stools. 04/24/15  Yes Tonia Ghent, MD  Multiple Vitamin (MULTIVITAMIN WITH MINERALS) TABS tablet Take 1 tablet by mouth daily.   Yes Historical Provider, MD  nitroGLYCERIN (NITROSTAT) 0.4 MG SL tablet Place 1 tablet (0.4 mg total) under the tongue every 5 (five) minutes as needed. For chest pain 05/24/15  Yes Larey Dresser, MD  OVER THE COUNTER MEDICATION Place 2 drops into both eyes 4 (four) times daily. Wash type eye drop-   Yes Historical Provider, MD  oxyCODONE-acetaminophen (ROXICET) 5-325 MG per tablet Take 1 tablet by mouth every 4 (four) hours as needed for severe pain. 03/21/15  Yes Ladell Pier, MD  pantoprazole (PROTONIX) 40 MG tablet TAKE 1 TABLET DAILY Patient taking differently: TAKE 1 TABLET TWICE DAILY 05/08/15  Yes Larey Dresser, MD  POLY-IRON 150 FORTE 121-97-5 MG-MCG-MG CAPS TAKE 1 CAPSULE TWICE A DAY 05/08/15  Yes Larey Dresser, MD  pramoxine-hydrocortisone (PROCTOCREAM-HC) 1-1 % rectal cream Place 1 application rectally 2 (two) times daily.   Yes Historical Provider, MD  predniSONE (DELTASONE) 5 MG tablet Take 5-60 mg by mouth as directed. Starting 05/22/15:  Take 12 tabs daily x 4 days, take 8 tabs daily x 4 days, take 4 tabs daily x 4 days, take 2 tabs daily x 4 days, then resume 5 mg daily.   Yes Historical Provider, MD  [place]; [place].   Yes Historical Provider, MD  Probiotic Product (PROBIOTIC & ACIDOPHILUS EX ST PO) Take 1 tablet by mouth every morning.    Yes Historical Provider, MD  prochlorperazine (COMPAZINE) 5  MG tablet Take 1 tablet (5 mg total) by mouth every 6 (six) hours as needed for nausea or vomiting. 03/21/15  Yes Ladell Pier, MD  Psyllium (METAMUCIL PO) Take 20 mLs by mouth daily as needed (constipation).  Yes Historical Provider, MD  spironolactone (ALDACTONE) 25 MG tablet Take 0.5 tablets (12.5 mg total) by mouth daily. 04/17/15  Yes Oswald Hillock, MD  fluconazole (DIFLUCAN) 100 MG tablet Take 1 tablet (100 mg total) by mouth daily. Patient not taking: Reported on 06/01/2015 05/23/15   Owens Shark, NP  pramoxine (PROCTOFOAM) 1 % foam Place 1 application rectally 3 (three) times daily as needed for itching. Patient not taking: Reported on 06/01/2015 04/24/15   Tonia Ghent, MD  predniSONE (DELTASONE) 5 MG tablet Take 1 tablet (5 mg total) by mouth daily with breakfast. 03/31/15   Owens Shark, NP     Family History  Problem Relation Age of Onset  . Thyroid cancer Mother   . Diabetes Father   . Heart disease Father   . Stroke Brother     cerebral hemm  . Colon cancer Brother   . Anesthesia problems Neg Hx   . Hypotension Neg Hx   . Malignant hyperthermia Neg Hx   . Pseudochol deficiency Neg Hx   . Prostate cancer Neg Hx   . Heart attack Father     History   Social History  . Marital Status: Widowed    Spouse Name: N/A  . Number of Children: 2  . Years of Education: college   Occupational History  . Retired     1996 [employer]   Social History Main Topics  . Smoking status: Former Smoker -- 2.00 packs/day for 20 years    Types: Cigarettes, Cigars    Quit date: 09/27/1967  . Smokeless tobacco: Never Used  . Alcohol Use: No  . Drug Use: No  . Sexual Activity: Not on file   Other Topics Concern  . None   Social History Narrative   Widowed 2013 after 63+ years of marriage, Wife had CHF and multiple myeloma   [name], daughter in Sports coach, lives with patient   Patient does not get regular exercise   No caffeine   Retired from [employer] ([employer]), retired from [employer]    Patient is left handed.      Review of Systems see above  Vital Signs: BP 127/48 mmHg  Pulse 57  Temp(Src) 98.4 F (36.9 C) (Oral)  Resp 14  Ht 6' (1.829 m)  Wt 205 lb 4 oz (93.1 kg)  BMI 27.83 kg/m2  SpO2 97%  Physical Exam patient awake, alert. Chest  clear to auscultation bilaterally; clean, intact right chest wall Port-A-Cath ;heart with regular rate and rhythm; abdomen obese, soft, positive bowel sounds, nontender; extremities with 1-2+ edema, greater on left  Mallampati Score:     Imaging: Dg Chest 2 View  05/16/2015   CLINICAL DATA:  Shortness of breath with fever and chills. Metastatic duodenum cancer  EXAM: CHEST  2 VIEW  COMPARISON:  May 15, 2015  FINDINGS: Port-A-Cath tip is in the superior vena cava. No pneumothorax. There is no edema or consolidation. There is slight bibasilar lung scarring. Heart is upper limits normal in size with pulmonary vascularity within normal limits. No adenopathy. No bone lesions.  IMPRESSION: Slight bibasilar lung scarring.  No edema or consolidation.   Electronically Signed   By: Lowella Grip III M.D.   On: 05/16/2015 11:46   Dg Chest 2 View  05/15/2015   CLINICAL DATA:  Pt from home, complains of emesis and dizziness, denies weakness. Hx colon cancer.  EXAM: CHEST  2 VIEW  COMPARISON:  Cough 04/12/2015  FINDINGS: Cardiac silhouette mildly enlarged. No mediastinal or hilar masses or evidence of adenopathy.  Minor reticular scarring in the antral lateral lung bases. This is unchanged. No lung consolidation or edema. No pleural effusion or pneumothorax.  Right anterior chest wall Port-A-Cath is stable.  Bony thorax is intact.  IMPRESSION: No acute cardiopulmonary disease.   Electronically Signed   By: Lajean Manes M.D.   On: 05/15/2015 17:08    Labs:  CBC:  Recent Labs  05/19/15 0547 05/23/15 1005 06/01/15 1755 06/02/15 0500  WBC 17.3* 29.6* 12.1* 11.0*  HGB 8.5* 8.4* 6.6* 8.1*  HCT 26.6* 26.8* 21.3*  24.9*  PLT 195 257 132* 94*    COAGS:  Recent Labs  01/26/15 0708  03/03/15 0535 03/08/15 0510 05/16/15 0510 06/02/15 0500  INR 3.30*  < > 1.15 1.29 1.93* 1.35  APTT 63*  --   --  39* 46* 26  < > = values in this interval not displayed.  BMP:  Recent Labs  05/18/15 0550 05/20/15 0530 05/23/15 1005 06/01/15 1755 06/02/15 0500  NA 134* 131* 137 132* 133*  K 4.2 3.9 3.9 4.8 4.1  CL 108 105  --  102 101  CO2 17* 18* 17* 20* 23  GLUCOSE 121* 111* 161* 155* 141*  BUN 21* 26* 27.8* 29* 26*  CALCIUM 8.0* 7.9* 8.0* 8.0* 8.0*  CREATININE 1.25* 1.19 1.1 0.89 0.86  GFRNONAA 51* 54*  --  >60 >60  GFRAA 59* >60  --  >60 >60    LIVER FUNCTION TESTS:  Recent Labs  05/16/15 0510 05/23/15 1005 06/01/15 1755 06/02/15 0500  BILITOT 0.7 0.83 1.9* 2.9*  AST 35 33 61* 45*  ALT 44 76* 70* 69*  ALKPHOS 195* 218* 380* 376*  PROT 5.2* 5.4* 5.2* 5.1*  ALBUMIN 2.0* 2.3* 2.5* 2.4*    TUMOR MARKERS:  Recent Labs  03/01/15 2219 04/11/15 1249  CEA 1.4 1.2  CA199 2  --     Assessment and Plan: James [name] is a 81 y.o. 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 He was recently admitted with weakness and black stools of approximately 2 days duration. Patient has known history of esophageal varices with an ulcerated duodenal bulb mass as well as ongoing slow GI blood loss requiring periodic transfusions. He has now been diagnosed with bilateral lower extremity DVTs and request has now been received for IVC filter placement. He currently denies headache, chest pain, abdominal pain, or nausea. Imaging studies have been reviewed by Dr. Kathlene Cote.Risks and benefits discussed with the patient/family including, but not limited to bleeding, infection, contrast induced renal failure, filter fracture or migration which can lead to emergency surgery or even death, strut penetration with damage or irritation to adjacent  structures and caval thrombosis.All of the patient's questions were answered, patient is agreeable to proceed. Consent signed and in chart. Procedure tent  scheduled for later today.      Signed: D. Rowe Robert 06/02/2015, 1:14 PM   I spent a total of 20 minutes  in face to face in clinical consultation, greater than 50% of which was counseling/coordinating care for IVC filter placement.

## 2015-06-03 ENCOUNTER — Other Ambulatory Visit: Payer: Self-pay | Admitting: Hematology

## 2015-06-03 DIAGNOSIS — D509 Iron deficiency anemia, unspecified: Secondary | ICD-10-CM

## 2015-06-03 DIAGNOSIS — I82411 Acute embolism and thrombosis of right femoral vein: Secondary | ICD-10-CM

## 2015-06-03 DIAGNOSIS — C787 Secondary malignant neoplasm of liver and intrahepatic bile duct: Secondary | ICD-10-CM

## 2015-06-03 DIAGNOSIS — D638 Anemia in other chronic diseases classified elsewhere: Secondary | ICD-10-CM

## 2015-06-03 LAB — BASIC METABOLIC PANEL WITH GFR
Anion gap: 9 (ref 5–15)
BUN: 25 mg/dL — ABNORMAL HIGH (ref 6–20)
CO2: 25 mmol/L (ref 22–32)
Calcium: 7.9 mg/dL — ABNORMAL LOW (ref 8.9–10.3)
Chloride: 101 mmol/L (ref 101–111)
Creatinine, Ser: 0.77 mg/dL (ref 0.61–1.24)
GFR calc Af Amer: >60 mL/min
GFR calc non Af Amer: >60 mL/min
Glucose, Bld: 107 mg/dL — ABNORMAL HIGH (ref 65–99)
Potassium: 4 mmol/L (ref 3.5–5.1)
Sodium: 135 mmol/L (ref 135–145)

## 2015-06-03 LAB — GLUCOSE, CAPILLARY
GLUCOSE-CAPILLARY: 117 mg/dL — AB (ref 65–99)
Glucose-Capillary: 118 mg/dL — ABNORMAL HIGH (ref 65–99)
Glucose-Capillary: 111 mg/dL — ABNORMAL HIGH (ref 65–99)
Glucose-Capillary: 147 mg/dL — ABNORMAL HIGH (ref 65–99)

## 2015-06-03 LAB — CBC
HCT: 25.6 % — ABNORMAL LOW (ref 39.0–52.0)
HCT: 29.5 % — ABNORMAL LOW (ref 39.0–52.0)
HEMOGLOBIN: 9.6 g/dL — AB (ref 13.0–17.0)
Hemoglobin: 8.3 g/dL — ABNORMAL LOW (ref 13.0–17.0)
MCH: 30.2 pg (ref 26.0–34.0)
MCH: 29.9 pg (ref 26.0–34.0)
MCHC: 32.4 g/dL (ref 30.0–36.0)
MCHC: 32.5 g/dL (ref 30.0–36.0)
MCV: 93.1 fL (ref 78.0–100.0)
MCV: 91.9 fL (ref 78.0–100.0)
PLATELETS: 98 10*3/uL — AB (ref 150–400)
Platelets: 97 10*3/uL — ABNORMAL LOW (ref 150–400)
RBC: 2.75 MIL/uL — ABNORMAL LOW (ref 4.22–5.81)
RBC: 3.21 MIL/uL — AB (ref 4.22–5.81)
RDW: 20.8 % — ABNORMAL HIGH (ref 11.5–15.5)
RDW: 20.5 % — ABNORMAL HIGH (ref 11.5–15.5)
WBC: 8.8 10*3/uL (ref 4.0–10.5)
WBC: 9 10*3/uL (ref 4.0–10.5)

## 2015-06-03 LAB — PREPARE RBC (CROSSMATCH)

## 2015-06-03 LAB — HEMOGLOBIN A1C
Hgb A1c MFr Bld: 6.1 % — ABNORMAL HIGH (ref 4.8–5.6)
MEAN PLASMA GLUCOSE: 128 mg/dL

## 2015-06-03 MED ORDER — SODIUM CHLORIDE 0.9 % IJ SOLN
10.0000 mL | INTRAMUSCULAR | Status: DC | PRN
  Administered 2015-06-03 (×2): 10 mL
  Filled 2015-06-03 (×2): qty 40

## 2015-06-03 MED ORDER — SODIUM CHLORIDE 0.9 % IV SOLN
Freq: Once | INTRAVENOUS | Status: AC
  Administered 2015-06-03: 09:00:00 via INTRAVENOUS

## 2015-06-03 NOTE — Progress Notes (Signed)
Patient Demographics  James [name], is a 81 y.o. male, DOB - [birth year], HAF:[id number]  Admit date - 06/01/2015   Admitting Physician Allyne Gee, MD  Outpatient Primary MD for the patient is James Stain, MD  LOS - 2   Chief Complaint  Patient presents with  . Weakness  . 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 He was found to have small to medium esophageal varices and a very large circumferential ulcerated mass in the duodenal bulb. This is not amenable to endoscopic therapy. Presents with melena, found to have hemoglobin of 6.6, transfuse 2 units, unfortunately his venous Doppler is positive for DVT. IVC filter inserted 06/02/15  Subjective:   James [name] today has, No headache, No chest pain, No abdominal pain - No Nausea, reports generalized weakness , no coffee-ground emesis, no bowel movement overnight. Assessment & Plan    Principal Problem:   Symptomatic anemia Active Problems:   OBESITY, MORBID   HYPERTENSION, BENIGN   HLD (hyperlipidemia)   Chronic diastolic CHF (congestive heart failure)   GI bleed   Gastrointestinal hemorrhage with melena  Symptomatic anemia secondary to GI bleed : -Transfused 3 units PRBC over the last 24 hours , transfuse another unit today given hemoglobin is 8.3 as  goal is to keep hemoglobin > 9 .  Upper GI bleed  - Patient had slow ongoing chronic GI bleed secondary to metastatic duodenal adenocarcinoma bleed , this is unlikely related to varices bleed , octreotide stopped by GI , no indication for EGD at this point . - Monitor hemoglobin closely and keep > 9   Acute DVT  - At this point patient is contraindicated for  anticoagIVC filter placed by IR 03/20/18  Chronic diastolic CHF - Last EF 16-60% - Appears compensated, continue with Lasix and Aldactone given multiple transfusions.  Duodenal Adenocarcinoma with metastasis to liver - Oncology consult appreciated       Code Status:  full  Family Communication:Discussed with daughter at bedside 6/10 despostion Plan: Pending further workup   Procedures  3units PRBC transfusion 6/9   Consults   GI  IR   Medications  Scheduled Meds: . antiseptic oral rinse  7 mL Mouth Rinse q12n4p  . chlorhexidine  15 mL Mouth Rinse BID  . cholecalciferol  2,000 Units Oral Daily  . DULoxetine  30 mg Oral BID  . feeding supplement (ENSURE ENLIVE)  237 mL Oral TID BM  . finasteride  5 mg Oral Daily  . folic acid  1 mg Oral Daily  . furosemide  20 mg Oral Daily  . hydrocortisone-pramoxine  1 applicator Rectal BID  . insulin aspart  0-15 Units Subcutaneous TID WC  . iron polysaccharides  150 mg Oral BID  . levothyroxine  50 mcg Oral QAC breakfast  . multivitamin with minerals  1 tablet Oral Daily  . octreotide  50 mcg Intravenous Once  . [START ON 06/05/2015] pantoprazole (PROTONIX) IV  40 mg Intravenous Q12H  . pravastatin  80 mg Oral q1800  . saccharomyces boulardii  250 mg Oral Daily  . sodium chloride  3 mL Intravenous Q12H  . spironolactone  12.5 mg Oral Daily  . thiamine  100 mg Oral Daily   Continuous Infusions: . sodium chloride 50 mL/hr at 06/02/15 1814   PRN Meds:.benzonatate, dibucaine, LORazepam, nitroGLYCERIN, ondansetron **OR** ondansetron (ZOFRAN) IV, oxyCODONE-acetaminophen  DVT Prophylaxis  No chemical anticoagulation due to GI bleed, no SCD due to acute DVT.  Lab Results  Component Value Date   PLT 97* 06/03/2015    Antibiotics    Anti-infectives    Start     Dose/Rate Route Frequency Ordered Stop   06/01/15 2100  fluconazole (DIFLUCAN) tablet 100 mg  Status:  Discontinued     100 mg Oral Daily 06/01/15 2050 06/01/15 2108           Objective:   Filed Vitals:   06/03/15 0400 06/03/15 0800 06/03/15 1200 06/03/15 1204  BP: 107/57  119/51   Pulse: 54  61 63  Temp:  98.4 F (36.9 C) 98.5 F (36.9 C) 98.2 F (36.8 C)  TempSrc:  Oral Oral   Resp: 12  20 18   Height:      Weight:      SpO2: 93%  95% 96%    Wt Readings from Last 3 Encounters:  06/03/15 92 kg (202 lb 13.2 oz)  05/29/15 92.534 kg (204 lb)  05/25/15 92.534 kg (204 lb)     Intake/Output Summary (Last 24 hours) at 06/03/15 1239 Last data filed at 06/03/15 1204  Gross per 24 hour  Intake   1255 ml  Output   1610 ml  Net   -355 ml     Physical Exam  Awake Alert, Oriented X 3, No new F.N deficits, Normal affect [place].AT,PERRAL Supple Neck,No JVD, No cervical lymphadenopathy appriciated.  Symmetrical Chest wall movement, Good air movement bilaterally,  RRR,No Gallops,Rubs or new Murmurs, No Parasternal Heave +ve B.Sounds, Abd Soft, No tenderness, No organomegaly appriciated, No rebound - guarding or rigidity. No Cyanosis, B/L  edema, No new Rash or bruise    Data Review   Micro Results No results found for this or any previous visit (from the past 240 hour(s)).  Radiology Reports Dg Chest 2 View  05/16/2015   CLINICAL DATA:  Shortness of breath with fever and chills. Metastatic duodenum cancer  EXAM: CHEST  2 VIEW  COMPARISON:  May 15, 2015  FINDINGS: Port-A-Cath tip is in the superior vena cava. No pneumothorax. There is no edema or consolidation. There is slight bibasilar lung scarring. Heart is upper limits normal in size with pulmonary vascularity within normal limits. No adenopathy. No bone lesions.  IMPRESSION: Slight bibasilar lung scarring.  No edema or consolidation.   Electronically Signed   By: Lowella Grip III M.D.   On: 05/16/2015 11:46   Dg Chest 2 View  05/15/2015   CLINICAL DATA:  Pt from home, complains of emesis and dizziness, denies weakness. Hx colon cancer.  EXAM: CHEST  2 VIEW  COMPARISON:  Cough  04/12/2015  FINDINGS: Cardiac silhouette mildly enlarged. No mediastinal or hilar masses or evidence of adenopathy.  Minor reticular scarring in the antral lateral lung bases. This is unchanged. No lung consolidation or edema. No pleural effusion or pneumothorax.  Right anterior chest wall Port-A-Cath is stable.  Bony thorax is intact.  IMPRESSION: No acute cardiopulmonary disease.   Electronically Signed   By: Lajean Manes M.D.   On: 05/15/2015 17:08   Ir Ivc Filter Plmt / S&i Burke Keels  Guid/mod Sed  06/02/2015   CLINICAL DATA:  Bilateral lower extremity DVT, duodenum carcinoma and GI bleed. The patient requires placement of an IVC filter for protection from thromboembolic disease.  EXAM: 1. ULTRASOUND GUIDANCE FOR VASCULAR ACCESS OF THE RIGHT COMMON FEMORAL VEIN. 2. IVC VENOGRAM. 3. PERCUTANEOUS IVC FILTER PLACEMENT.  ANESTHESIA/SEDATION: 1.0 mg IV Versed; 25 mcg IV Fentanyl.  Total Moderate Sedation Time  42minutes.  CONTRAST:  69mL OMNIPAQUE IOHEXOL 300 MG/ML SOLN, 72mL OMNIPAQUE IOHEXOL 300 MG/ML SOLN  FLUOROSCOPY TIME:  1 minutes and 18 seconds.  PROCEDURE: The procedure, risks, benefits, and alternatives were explained to the patient. Questions regarding the procedure were encouraged and answered. The patient understands and consents to the procedure.  A time-out was performed prior to the procedure. The right groin was prepped with Betadine in a sterile fashion, and a sterile drape was applied covering the operative field. A sterile gown and sterile gloves were used for the procedure. Local anesthesia was provided with 1% Lidocaine.  Patency of the right common femoral vein was confirmed by ultrasound. Under direct ultrasound guidance, a 21 gauge needle was advanced into the right common femoral vein with ultrasound image documentation performed. After securing access with a micropuncture dilator, a guidewire was advanced into the inferior vena cava. A deployment sheath was advanced over the guidewire. This  was utilized to perform IVC venography.  The deployment sheath was further positioned in an appropriate location for filter deployment. A Bard Denali IVC filter was then advanced in the sheath. This was then fully deployed in the infrarenal IVC. Final filter position was confirmed with a fluoroscopic spot image. Contrast injection was also performed through the sheath under fluoroscopy to confirm patency of the IVC at the level of the filter. After the procedure the sheath was removed and hemostasis obtained with manual compression.  COMPLICATIONS: None.  FINDINGS: IVC venography demonstrates a normal caliber IVC with no evidence of thrombus. Renal veins are identified bilaterally. The IVC filter was successfully positioned below the level of the renal veins and is appropriately oriented. This IVC filter has both permanent and retrievable indications.  IMPRESSION: Placement of percutaneous IVC filter in infrarenal IVC. IVC venogram shows no evidence of IVC thrombus and normal caliber of the inferior vena cava. This filter does have both permanent and retrievable indications. Given the patient's history of metastatic malignancy and poor performance status, the filter will be assumed to be a permanent filter.   Electronically Signed   By: Aletta Edouard M.D.   On: 06/02/2015 17:14     CBC  Recent Labs Lab 06/01/15 1755 06/02/15 0500 06/02/15 1800 06/03/15 0512  WBC 12.1* 11.0*  --  8.8  HGB 6.6* 8.1* 8.8* 8.3*  HCT 21.3* 24.9* 27.0* 25.6*  PLT 132* 94*  --  97*  MCV 94.2 92.9  --  93.1  MCH 29.2 30.2  --  30.2  MCHC 31.0 32.5  --  32.4  RDW 24.2* 21.2*  --  20.8*    Chemistries   Recent Labs Lab 06/01/15 1755 06/02/15 0500 06/03/15 0512  NA 132* 133* 135  K 4.8 4.1 4.0  CL 102 101 101  CO2 20* 23 25  GLUCOSE 155* 141* 107*  BUN 29* 26* 25*  CREATININE 0.89 0.86 0.77  CALCIUM 8.0* 8.0* 7.9*  AST 61* 45*  --   ALT 70* 69*  --   ALKPHOS 380* 376*  --   BILITOT 1.9* 2.9*  --     ------------------------------------------------------------------------------------------------------------------ estimated  creatinine clearance is 75.4 mL/min (by C-G formula based on Cr of 0.77). ------------------------------------------------------------------------------------------------------------------  Recent Labs  06/02/15 0045  HGBA1C 6.1*   ------------------------------------------------------------------------------------------------------------------ No results for input(s): CHOL, HDL, LDLCALC, TRIG, CHOLHDL, LDLDIRECT in the last 72 hours. ------------------------------------------------------------------------------------------------------------------ No results for input(s): TSH, T4TOTAL, T3FREE, THYROIDAB in the last 72 hours.  Invalid input(s): FREET3 ------------------------------------------------------------------------------------------------------------------ No results for input(s): VITAMINB12, FOLATE, FERRITIN, TIBC, IRON, RETICCTPCT in the last 72 hours.  Coagulation profile  Recent Labs Lab 06/02/15 0500  INR 1.35    No results for input(s): DDIMER in the last 72 hours.  Cardiac Enzymes No results for input(s): CKMB, TROPONINI, MYOGLOBIN in the last 168 hours.  Invalid input(s): CK ------------------------------------------------------------------------------------------------------------------ Invalid input(s): POCBNP     Time Spent in minutes   35 minutes   Karmin Kasprzak M.D on 06/03/2015 at 12:39 PM  Between 7am to 7pm - Pager - [phone]  After 7pm go to [email or web address] - password [facility]  [facility]   Office  [phone]

## 2015-06-03 NOTE — Progress Notes (Signed)
James [name]   DOB:[birth year]   HC#:[id number]   DVV#:[id number]  Subjective: I'm covering Pt's oncologist Dr. Learta Codding this weekend and saw pt. He had IVC filter placed. No bowel movement overnight. He denies pain, feels relatively well.    Objective:  Filed Vitals:   06/03/15 1204  BP:   Pulse: 63  Temp: 98.2 F (36.8 C)  Resp: 18    Body mass index is 27.5 kg/(m^2).  Intake/Output Summary (Last 24 hours) at 06/03/15 1414 Last data filed at 06/03/15 1204  Gross per 24 hour  Intake   1205 ml  Output   1160 ml  Net     45 ml     Sclerae unicteric  Oropharynx clear  No peripheral adenopathy  Lungs clear -- no rales or rhonchi  Heart regular rate and rhythm  Abdomen benign  MSK no focal spinal tenderness, no peripheral edema  Neuro nonfocal   CBG (last 3)   Recent Labs  06/02/15 2138 06/03/15 0815 06/03/15 1217  GLUCAP 163* 118* 117*     Labs:  Lab Results  Component Value Date   WBC 8.8 06/03/2015   HGB 8.3* 06/03/2015   HCT 25.6* 06/03/2015   MCV 93.1 06/03/2015   PLT 97* 06/03/2015   NEUTROABS 27.1* 05/23/2015    @LASTCHEMISTRY @  Urine Studies No results for input(s): UHGB, CRYS in the last 72 hours.  Invalid input(s): UACOL, UAPR, USPG, UPH, UTP, UGL, UKET, UBIL, UNIT, UROB, ULEU, UEPI, UWBC, URBC, UBAC, CAST, [place], [place]  Basic Metabolic Panel:  Recent Labs Lab 06/01/15 1755 06/02/15 0500 06/03/15 0512  NA 132* 133* 135  K 4.8 4.1 4.0  CL 102 101 101  CO2 20* 23 25  GLUCOSE 155* 141* 107*  BUN 29* 26* 25*  CREATININE 0.89 0.86 0.77  CALCIUM 8.0* 8.0* 7.9*   GFR Estimated Creatinine Clearance: 75.4 mL/min (by C-G formula based on Cr of 0.77). Liver Function Tests:  Recent Labs Lab 06/01/15 1755 06/02/15 0500  AST 61* 45*  ALT 70* 69*  ALKPHOS 380* 376*  BILITOT 1.9* 2.9*  PROT 5.2* 5.1*  ALBUMIN 2.5* 2.4*   No results for input(s): LIPASE, AMYLASE in the last 168 hours. No results for input(s): AMMONIA in the last 168  hours. Coagulation profile  Recent Labs Lab 06/02/15 0500  INR 1.35    CBC:  Recent Labs Lab 06/01/15 1755 06/02/15 0500 06/02/15 1800 06/03/15 0512  WBC 12.1* 11.0*  --  8.8  HGB 6.6* 8.1* 8.8* 8.3*  HCT 21.3* 24.9* 27.0* 25.6*  MCV 94.2 92.9  --  93.1  PLT 132* 94*  --  97*   Cardiac Enzymes: No results for input(s): CKTOTAL, CKMB, CKMBINDEX, TROPONINI in the last 168 hours. BNP: Invalid input(s): POCBNP CBG:  Recent Labs Lab 06/02/15 1143 06/02/15 1704 06/02/15 2138 06/03/15 0815 06/03/15 1217  GLUCAP 125* 187* 163* 118* 117*   D-Dimer No results for input(s): DDIMER in the last 72 hours. Hgb A1c  Recent Labs  06/02/15 0045  HGBA1C 6.1*   Lipid Profile No results for input(s): CHOL, HDL, LDLCALC, TRIG, CHOLHDL, LDLDIRECT in the last 72 hours. Thyroid function studies No results for input(s): TSH, T4TOTAL, T3FREE, THYROIDAB in the last 72 hours.  Invalid input(s): FREET3 Anemia work up No results for input(s): VITAMINB12, FOLATE, FERRITIN, TIBC, IRON, RETICCTPCT in the last 72 hours. Microbiology No results found for this or any previous visit (from the past 240 hour(s)).    Studies:  Ir Ivc Filter Plmt /  S&i /img Guid/mod Sed  06/02/2015   CLINICAL DATA:  Bilateral lower extremity DVT, duodenum carcinoma and GI bleed. The patient requires placement of an IVC filter for protection from thromboembolic disease.  EXAM: 1. ULTRASOUND GUIDANCE FOR VASCULAR ACCESS OF THE RIGHT COMMON FEMORAL VEIN. 2. IVC VENOGRAM. 3. PERCUTANEOUS IVC FILTER PLACEMENT.  ANESTHESIA/SEDATION: 1.0 mg IV Versed; 25 mcg IV Fentanyl.  Total Moderate Sedation Time  73minutes.  CONTRAST:  58mL OMNIPAQUE IOHEXOL 300 MG/ML SOLN, 39mL OMNIPAQUE IOHEXOL 300 MG/ML SOLN  FLUOROSCOPY TIME:  1 minutes and 18 seconds.  PROCEDURE: The procedure, risks, benefits, and alternatives were explained to the patient. Questions regarding the procedure were encouraged and answered. The patient  understands and consents to the procedure.  A time-out was performed prior to the procedure. The right groin was prepped with Betadine in a sterile fashion, and a sterile drape was applied covering the operative field. A sterile gown and sterile gloves were used for the procedure. Local anesthesia was provided with 1% Lidocaine.  Patency of the right common femoral vein was confirmed by ultrasound. Under direct ultrasound guidance, a 21 gauge needle was advanced into the right common femoral vein with ultrasound image documentation performed. After securing access with a micropuncture dilator, a guidewire was advanced into the inferior vena cava. A deployment sheath was advanced over the guidewire. This was utilized to perform IVC venography.  The deployment sheath was further positioned in an appropriate location for filter deployment. A Bard Denali IVC filter was then advanced in the sheath. This was then fully deployed in the infrarenal IVC. Final filter position was confirmed with a fluoroscopic spot image. Contrast injection was also performed through the sheath under fluoroscopy to confirm patency of the IVC at the level of the filter. After the procedure the sheath was removed and hemostasis obtained with manual compression.  COMPLICATIONS: None.  FINDINGS: IVC venography demonstrates a normal caliber IVC with no evidence of thrombus. Renal veins are identified bilaterally. The IVC filter was successfully positioned below the level of the renal veins and is appropriately oriented. This IVC filter has both permanent and retrievable indications.  IMPRESSION: Placement of percutaneous IVC filter in infrarenal IVC. IVC venogram shows no evidence of IVC thrombus and normal caliber of the inferior vena cava. This filter does have both permanent and retrievable indications. Given the patient's history of metastatic malignancy and poor performance status, the filter will be assumed to be a permanent filter.    Electronically Signed   By: Aletta Edouard M.D.   On: 06/02/2015 17:14    Assessment: 81 y.o.  1. Metastatic duodenum adenocarcinoma 2. Anemia secondary to GI bleeding and iron deficiency 3. DVT, status post IVC filter placement 4. CAD, Chronic diastolic CHF, stable  Plan: -His GI bleeding is likely secondary to his duodenum adenocarcinoma, he also has history of varices -Agree with blood transfusion to keep his hemoglobin above 8.5, giving his underlying coronary artery disease -He received IV Feraheme in March. Repeat ferritin in May was normal. Consider repeat ferritin and iron study, if low, consider IV Feraheme -We discussed the role of palliative radiation to his duodenum tumor to slow down or stop the bleeding. Potential side effects were discussed with patient, he is a little reluctant. Giving his advanced age, potential side effects from radiation, will not call consult for now. He will further discuss with Dr. Learta Codding next week -If he has significant worsening bleeding, please consult IR to consider embolization  I'll follow him if needed  tomorrow. Dr. Learta Codding will be back on Monday.  Truitt Merle, MD 06/03/2015  2:14 PM

## 2015-06-04 LAB — GLUCOSE, CAPILLARY
GLUCOSE-CAPILLARY: 116 mg/dL — AB (ref 65–99)
GLUCOSE-CAPILLARY: 101 mg/dL — AB (ref 65–99)
Glucose-Capillary: 127 mg/dL — ABNORMAL HIGH (ref 65–99)
Glucose-Capillary: 145 mg/dL — ABNORMAL HIGH (ref 65–99)

## 2015-06-04 LAB — BASIC METABOLIC PANEL
ANION GAP: 11 (ref 5–15)
BUN: 24 mg/dL — ABNORMAL HIGH (ref 6–20)
CO2: 24 mmol/L (ref 22–32)
CREATININE: 0.69 mg/dL (ref 0.61–1.24)
Calcium: 8.1 mg/dL — ABNORMAL LOW (ref 8.9–10.3)
Chloride: 99 mmol/L — ABNORMAL LOW (ref 101–111)
GFR calc non Af Amer: >60 mL/min (ref >60)
Glucose, Bld: 98 mg/dL (ref 65–99)
Potassium: 4.2 mmol/L (ref 3.5–5.1)
Sodium: 134 mmol/L — ABNORMAL LOW (ref 135–145)

## 2015-06-04 LAB — CBC
HEMATOCRIT: 29.3 % — AB (ref 39.0–52.0)
Hemoglobin: 9.6 g/dL — ABNORMAL LOW (ref 13.0–17.0)
MCH: 30.1 pg (ref 26.0–34.0)
MCHC: 32.8 g/dL (ref 30.0–36.0)
MCV: 91.8 fL (ref 78.0–100.0)
Platelets: 93 10*3/uL — ABNORMAL LOW (ref 150–400)
RBC: 3.19 MIL/uL — AB (ref 4.22–5.81)
RDW: 20.6 % — AB (ref 11.5–15.5)
WBC: 8.8 10*3/uL (ref 4.0–10.5)

## 2015-06-04 LAB — IRON AND TIBC
IRON: 11 ug/dL — AB (ref 45–182)
Saturation Ratios: 5 % — ABNORMAL LOW (ref 17.9–39.5)
TIBC: 202 ug/dL — AB (ref 250–450)
UIBC: 191 ug/dL

## 2015-06-04 LAB — FERRITIN: Ferritin: 103 ng/mL (ref 24–336)

## 2015-06-04 MED ORDER — SODIUM CHLORIDE 0.9 % IJ SOLN
10.0000 mL | INTRAMUSCULAR | Status: DC | PRN
Start: 2015-06-04 — End: 2015-06-08
  Administered 2015-06-05 – 2015-06-08 (×3): 10 mL
  Administered 2015-06-08: 30 mL
  Filled 2015-06-04 (×4): qty 40

## 2015-06-04 MED ORDER — SODIUM CHLORIDE 0.9 % IJ SOLN: 10.0000 mL | Freq: Two times a day (BID) | INTRAMUSCULAR | Status: DC

## 2015-06-04 MED ORDER — POLYETHYLENE GLYCOL 3350 17 G PO PACK
17.0000 g | PACK | Freq: Two times a day (BID) | ORAL | Status: AC
  Administered 2015-06-04 – 2015-06-05 (×2): 17 g via ORAL
  Filled 2015-06-04 (×3): qty 1

## 2015-06-04 MED ORDER — BISACODYL 5 MG PO TBEC
10.0000 mg | DELAYED_RELEASE_TABLET | Freq: Once | ORAL | Status: AC
  Administered 2015-06-04: 10 mg via ORAL
  Filled 2015-06-04: qty 2

## 2015-06-04 NOTE — Progress Notes (Addendum)
Patient had what appeared to be a vagal episode while using the bedside commode to have a bowel movement.  Staff and family were present, pt began complaining of feeling dizzy, shortly after he slouched on the commode and began snoring heavily.  Staff had to place pt back to bed and once back in bed he began responding to [place].  Denies any pain, vitals stable, will continue to monitor patient.

## 2015-06-04 NOTE — Progress Notes (Addendum)
Patient Demographics  James [name], is a 81 y.o. male, DOB - [birth year], ERX:[id number]  Admit date - 06/01/2015   Admitting Physician Allyne Gee, MD  Outpatient Primary MD for the patient is [name], MD  LOS - 3   Chief Complaint  Patient presents with  . Weakness  . 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 He was found to have small to medium esophageal varices and a very large circumferential ulcerated mass in the duodenal bulb. This is not amenable to endoscopic therapy. Presents with melena, found to have hemoglobin of 6.6,unfortunately his venous Doppler is positive for DVT. IVC filter inserted 06/02/15  Subjective:   Janalyn [name] today has, No headache, No chest pain, No abdominal pain - No Nausea, reports generalized weakness , no coffee-ground emesis, still no bowel movement overnight. Assessment & Plan    Principal Problem:   Symptomatic anemia Active Problems:   OBESITY, MORBID   HYPERTENSION, BENIGN   HLD (hyperlipidemia)   Chronic diastolic CHF (congestive heart failure)   GI bleed   Gastrointestinal hemorrhage with melena  Symptomatic anemia secondary to GI bleed : -Transfused 3 units PRBC 06/02/15, and 1 unit on 06/03/15,   goal is to keep hemoglobin > 9 . Hemoglobin is 9.6 today. - We'll check ferritin and iron study and give IV Feraheme if level is low.  Upper GI bleed  - Patient had slow ongoing chronic GI bleed secondary to metastatic duodenal adenocarcinoma bleed , this is unlikely related to varices bleed , octreotide stopped by GI , no indication for EGD at this point . Will advance to soft diet as hemoglobin been stable. - Monitor  hemoglobin closely and keep > 9  - Followed by oncology to see if appropriate for radiation therapy  Acute DVT  - At this point patient is contraindicated for anticoagulation , IVC filter placed by IR 9/50/93  Chronic diastolic CHF - Last EF 26-71% - Appears compensated, continue with Lasix and Aldactone given multiple transfusions.  Duodenal Adenocarcinoma with metastasis to liver - Oncology consult appreciated       Code Status:  full  Family Communication:Discussed with daughter at bedside 6/10 despostion Plan: Pending further workup   Procedures  3units PRBC transfusion 6/10 1 unit PRBC transfusion 6/11   Consults   GI  IR Oncology  Medications  Scheduled Meds: . antiseptic oral rinse  7 mL Mouth Rinse q12n4p  . bisacodyl  10 mg Oral Once  . chlorhexidine  15 mL Mouth Rinse BID  . cholecalciferol  2,000 Units Oral Daily  . DULoxetine  30 mg Oral BID  . feeding supplement (ENSURE ENLIVE)  237 mL Oral TID BM  . finasteride  5 mg Oral Daily  . folic acid  1 mg Oral Daily  . furosemide  20 mg Oral Daily  . hydrocortisone-pramoxine  1 applicator Rectal BID  . insulin aspart  0-15 Units Subcutaneous TID WC  . iron polysaccharides  150 mg Oral BID  . levothyroxine  50 mcg Oral QAC breakfast  .  multivitamin with minerals  1 tablet Oral Daily  . octreotide  50 mcg Intravenous Once  . [START ON 06/05/2015] pantoprazole (PROTONIX) IV  40 mg Intravenous Q12H  . polyethylene glycol  17 g Oral BID  . pravastatin  80 mg Oral q1800  . saccharomyces boulardii  250 mg Oral Daily  . sodium chloride  3 mL Intravenous Q12H  . spironolactone  12.5 mg Oral Daily  . thiamine  100 mg Oral Daily   Continuous Infusions: . sodium chloride 50 mL/hr at 06/04/15 0700   PRN Meds:.benzonatate, dibucaine, LORazepam, nitroGLYCERIN, ondansetron **OR** ondansetron (ZOFRAN) IV, oxyCODONE-acetaminophen, sodium chloride  DVT Prophylaxis  No chemical anticoagulation due to GI bleed, no SCD  due to acute DVT. Has IVC filter.  Lab Results  Component Value Date   PLT 93* 06/04/2015    Antibiotics    Anti-infectives    Start     Dose/Rate Route Frequency Ordered Stop   06/01/15 2100  fluconazole (DIFLUCAN) tablet 100 mg  Status:  Discontinued     100 mg Oral Daily 06/01/15 2050 06/01/15 2108          Objective:   Filed Vitals:   06/03/15 2000 06/04/15 0000 06/04/15 0400 06/04/15 0800  BP: 121/50 119/54 126/51 113/57  Pulse: 66 66 66 73  Temp: 98.5 F (36.9 C) 98.4 F (36.9 C) 98.6 F (37 C) 97.3 F (36.3 C)  TempSrc: Oral Oral  Oral  Resp: 16 16 15 18   Height:      Weight:      SpO2: 94% 94% 93% 95%    Wt Readings from Last 3 Encounters:  06/03/15 92 kg (202 lb 13.2 oz)  05/29/15 92.534 kg (204 lb)  05/25/15 92.534 kg (204 lb)     Intake/Output Summary (Last 24 hours) at 06/04/15 1000 Last data filed at 06/04/15 0900  Gross per 24 hour  Intake   1485 ml  Output   2500 ml  Net  -1015 ml     Physical Exam  Awake Alert, Oriented X 3, No new F.N deficits, Normal affect Bode.AT,PERRAL Supple Neck,No JVD, No cervical lymphadenopathy appriciated.  Symmetrical Chest wall movement, Good air movement bilaterally,  RRR,No Gallops,Rubs or new Murmurs, No Parasternal Heave +ve B.Sounds, Abd Soft, No tenderness, No organomegaly appriciated, No rebound - guarding or rigidity. No Cyanosis, B/L  edema, No new Rash or bruise    Data Review   Micro Results No results found for this or any previous visit (from the past 240 hour(s)).  Radiology Reports Dg Chest 2 View  05/16/2015   CLINICAL DATA:  Shortness of breath with fever and chills. Metastatic duodenum cancer  EXAM: CHEST  2 VIEW  COMPARISON:  May 15, 2015  FINDINGS: Port-A-Cath tip is in the superior vena cava. No pneumothorax. There is no edema or consolidation. There is slight bibasilar lung scarring. Heart is upper limits normal in size with pulmonary vascularity within normal limits. No  adenopathy. No bone lesions.  IMPRESSION: Slight bibasilar lung scarring.  No edema or consolidation.   Electronically Signed   By: Lowella Grip III M.D.   On: 05/16/2015 11:46   Dg Chest 2 View  05/15/2015   CLINICAL DATA:  Pt from home, complains of emesis and dizziness, denies weakness. Hx colon cancer.  EXAM: CHEST  2 VIEW  COMPARISON:  Cough 04/12/2015  FINDINGS: Cardiac silhouette mildly enlarged. No mediastinal or hilar masses or evidence of adenopathy.  Minor reticular scarring in the antral lateral lung  bases. This is unchanged. No lung consolidation or edema. No pleural effusion or pneumothorax.  Right anterior chest wall Port-A-Cath is stable.  Bony thorax is intact.  IMPRESSION: No acute cardiopulmonary disease.   Electronically Signed   By: Lajean Manes M.D.   On: 05/15/2015 17:08   Ir Ivc Filter Plmt / S&i /img Guid/mod Sed  06/02/2015   CLINICAL DATA:  Bilateral lower extremity DVT, duodenum carcinoma and GI bleed. The patient requires placement of an IVC filter for protection from thromboembolic disease.  EXAM: 1. ULTRASOUND GUIDANCE FOR VASCULAR ACCESS OF THE RIGHT COMMON FEMORAL VEIN. 2. IVC VENOGRAM. 3. PERCUTANEOUS IVC FILTER PLACEMENT.  ANESTHESIA/SEDATION: 1.0 mg IV Versed; 25 mcg IV Fentanyl.  Total Moderate Sedation Time  69minutes.  CONTRAST:  26mL OMNIPAQUE IOHEXOL 300 MG/ML SOLN, 64mL OMNIPAQUE IOHEXOL 300 MG/ML SOLN  FLUOROSCOPY TIME:  1 minutes and 18 seconds.  PROCEDURE: The procedure, risks, benefits, and alternatives were explained to the patient. Questions regarding the procedure were encouraged and answered. The patient understands and consents to the procedure.  A time-out was performed prior to the procedure. The right groin was prepped with Betadine in a sterile fashion, and a sterile drape was applied covering the operative field. A sterile gown and sterile gloves were used for the procedure. Local anesthesia was provided with 1% Lidocaine.  Patency of the right  common femoral vein was confirmed by ultrasound. Under direct ultrasound guidance, a 21 gauge needle was advanced into the right common femoral vein with ultrasound image documentation performed. After securing access with a micropuncture dilator, a guidewire was advanced into the inferior vena cava. A deployment sheath was advanced over the guidewire. This was utilized to perform IVC venography.  The deployment sheath was further positioned in an appropriate location for filter deployment. A Bard Denali IVC filter was then advanced in the sheath. This was then fully deployed in the infrarenal IVC. Final filter position was confirmed with a fluoroscopic spot image. Contrast injection was also performed through the sheath under fluoroscopy to confirm patency of the IVC at the level of the filter. After the procedure the sheath was removed and hemostasis obtained with manual compression.  COMPLICATIONS: None.  FINDINGS: IVC venography demonstrates a normal caliber IVC with no evidence of thrombus. Renal veins are identified bilaterally. The IVC filter was successfully positioned below the level of the renal veins and is appropriately oriented. This IVC filter has both permanent and retrievable indications.  IMPRESSION: Placement of percutaneous IVC filter in infrarenal IVC. IVC venogram shows no evidence of IVC thrombus and normal caliber of the inferior vena cava. This filter does have both permanent and retrievable indications. Given the patient's history of metastatic malignancy and poor performance status, the filter will be assumed to be a permanent filter.   Electronically Signed   By: Aletta Edouard M.D.   On: 06/02/2015 17:14     CBC  Recent Labs Lab 06/01/15 1755 06/02/15 0500 06/02/15 1800 06/03/15 0512 06/03/15 2121 06/04/15 0510  WBC 12.1* 11.0*  --  8.8 9.0 8.8  HGB 6.6* 8.1* 8.8* 8.3* 9.6* 9.6*  HCT 21.3* 24.9* 27.0* 25.6* 29.5* 29.3*  PLT 132* 94*  --  97* 98* 93*  MCV 94.2 92.9  --   93.1 91.9 91.8  MCH 29.2 30.2  --  30.2 29.9 30.1  MCHC 31.0 32.5  --  32.4 32.5 32.8  RDW 24.2* 21.2*  --  20.8* 20.5* 20.6*    Chemistries   Recent Labs Lab  06/01/15 1755 06/02/15 0500 06/03/15 0512 06/04/15 0510  NA 132* 133* 135 134*  K 4.8 4.1 4.0 4.2  CL 102 101 101 99*  CO2 20* 23 25 24   GLUCOSE 155* 141* 107* 98  BUN 29* 26* 25* 24*  CREATININE 0.89 0.86 0.77 0.69  CALCIUM 8.0* 8.0* 7.9* 8.1*  AST 61* 45*  --   --   ALT 70* 69*  --   --   ALKPHOS 380* 376*  --   --   BILITOT 1.9* 2.9*  --   --    ------------------------------------------------------------------------------------------------------------------ estimated creatinine clearance is 75.4 mL/min (by C-G formula based on Cr of 0.69). ------------------------------------------------------------------------------------------------------------------  Recent Labs  06/02/15 0045  HGBA1C 6.1*   ------------------------------------------------------------------------------------------------------------------ No results for input(s): CHOL, HDL, LDLCALC, TRIG, CHOLHDL, LDLDIRECT in the last 72 hours. ------------------------------------------------------------------------------------------------------------------ No results for input(s): TSH, T4TOTAL, T3FREE, THYROIDAB in the last 72 hours.  Invalid input(s): FREET3 ------------------------------------------------------------------------------------------------------------------ No results for input(s): VITAMINB12, FOLATE, FERRITIN, TIBC, IRON, RETICCTPCT in the last 72 hours.  Coagulation profile  Recent Labs Lab 06/02/15 0500  INR 1.35    No results for input(s): DDIMER in the last 72 hours.  Cardiac Enzymes No results for input(s): CKMB, TROPONINI, MYOGLOBIN in the last 168 hours.  Invalid input(s): CK ------------------------------------------------------------------------------------------------------------------ Invalid input(s):  POCBNP     Time Spent in minutes   30 minutes   Klye Besecker M.D on 06/04/2015 at 10:00 AM  Between 7am to 7pm - Pager - [phone]  After 7pm go to [email or web address] - password [facility]  [facility]   Office  [phone]

## 2015-06-05 ENCOUNTER — Inpatient Hospital Stay (HOSPITAL_COMMUNITY): Payer: Medicare Other

## 2015-06-05 ENCOUNTER — Telehealth: Payer: Self-pay | Admitting: Family Medicine

## 2015-06-05 DIAGNOSIS — G893 Neoplasm related pain (acute) (chronic): Secondary | ICD-10-CM

## 2015-06-05 DIAGNOSIS — K831 Obstruction of bile duct: Secondary | ICD-10-CM

## 2015-06-05 DIAGNOSIS — R4182 Altered mental status, unspecified: Secondary | ICD-10-CM

## 2015-06-05 DIAGNOSIS — Z85820 Personal history of malignant melanoma of skin: Secondary | ICD-10-CM

## 2015-06-05 DIAGNOSIS — G629 Polyneuropathy, unspecified: Secondary | ICD-10-CM

## 2015-06-05 DIAGNOSIS — D6481 Anemia due to antineoplastic chemotherapy: Secondary | ICD-10-CM

## 2015-06-05 DIAGNOSIS — I82403 Acute embolism and thrombosis of unspecified deep veins of lower extremity, bilateral: Secondary | ICD-10-CM

## 2015-06-05 DIAGNOSIS — K644 Residual hemorrhoidal skin tags: Secondary | ICD-10-CM

## 2015-06-05 DIAGNOSIS — Z86718 Personal history of other venous thrombosis and embolism: Secondary | ICD-10-CM

## 2015-06-05 DIAGNOSIS — D500 Iron deficiency anemia secondary to blood loss (chronic): Secondary | ICD-10-CM

## 2015-06-05 DIAGNOSIS — D630 Anemia in neoplastic disease: Secondary | ICD-10-CM

## 2015-06-05 LAB — HEPATIC FUNCTION PANEL
ALBUMIN: 2.2 g/dL — AB (ref 3.5–5.0)
ALK PHOS: 929 U/L — AB (ref 38–126)
ALT: 111 U/L — AB (ref 17–63)
AST: 86 U/L — AB (ref 15–41)
Bilirubin, Direct: 7.2 mg/dL — ABNORMAL HIGH (ref 0.1–0.5)
Indirect Bilirubin: 4 mg/dL — ABNORMAL HIGH (ref 0.3–0.9)
TOTAL PROTEIN: 4.9 g/dL — AB (ref 6.5–8.1)
Total Bilirubin: 11.2 mg/dL — ABNORMAL HIGH (ref 0.3–1.2)

## 2015-06-05 LAB — CBC
HCT: 30.1 % — ABNORMAL LOW (ref 39.0–52.0)
HEMOGLOBIN: 9.8 g/dL — AB (ref 13.0–17.0)
MCH: 29.8 pg (ref 26.0–34.0)
MCHC: 32.6 g/dL (ref 30.0–36.0)
MCV: 91.5 fL (ref 78.0–100.0)
PLATELETS: 111 10*3/uL — AB (ref 150–400)
RBC: 3.29 MIL/uL — ABNORMAL LOW (ref 4.22–5.81)
RDW: 20.1 % — ABNORMAL HIGH (ref 11.5–15.5)
WBC: 10 10*3/uL (ref 4.0–10.5)

## 2015-06-05 LAB — TYPE AND SCREEN
ABO/RH(D): A NEG
ANTIBODY SCREEN: NEGATIVE
UNIT DIVISION: 0
Unit division: 0
Unit division: 0
Unit division: 0

## 2015-06-05 LAB — GLUCOSE, CAPILLARY
GLUCOSE-CAPILLARY: 100 mg/dL — AB (ref 65–99)
Glucose-Capillary: 122 mg/dL — ABNORMAL HIGH (ref 65–99)
Glucose-Capillary: 107 mg/dL — ABNORMAL HIGH (ref 65–99)

## 2015-06-05 LAB — AMMONIA: Ammonia: 149 umol/L — ABNORMAL HIGH (ref 9–35)

## 2015-06-05 MED ORDER — LACTULOSE 10 GM/15ML PO SOLN
30.0000 g | Freq: Three times a day (TID) | ORAL | Status: DC
  Administered 2015-06-05 (×3): 30 g via ORAL
  Filled 2015-06-05 (×5): qty 45

## 2015-06-05 MED ORDER — IOHEXOL 300 MG/ML  SOLN
100.0000 mL | Freq: Once | INTRAMUSCULAR | Status: AC | PRN
  Administered 2015-06-05: 100 mL via INTRAVENOUS

## 2015-06-05 MED ORDER — IOHEXOL 300 MG/ML  SOLN
25.0000 mL | INTRAMUSCULAR | Status: AC
  Administered 2015-06-05 (×2): 25 mL via ORAL

## 2015-06-05 NOTE — Progress Notes (Signed)
[facility] Progress Note  Subjective: James [name] is an 81 year old male known to Dr. Carlean Purl. He was admitted to the emergency room on June 9 with weakness and melena. 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 He underwent palliative gastrojejunostomy in March 2016. He is followed by Dr. Malachy Mood oncology and was undergoing treatment with FOLFOX that is on hold due to his poor performance status and recent pneumonia. He had an EGD in April for evaluation of hematemesis and was found to have small to medium esophageal varices and a very large circumferential ulcerated mass in the duodenal bulb which was not amenable to endoscopic therapy. He has had ongoing slow GI blood loss since then and has been receiving transfusions every couple of weeks as an outpatient. He had had a transfusion about 3 weeks prior to admission. Hemoglobin on 05/23/2015 was 8.4 and hemoglobin on June 9 with 6.6. He has not had any nausea, vomiting, abdominal pain or hematemesis he had no melena since admission he had not been on aspirin or NSAIDs. He was also found to have swelling bilaterally in his lower extremities and Doppler revealed bilateral DVTs. He underwent an IVC filter placement on 06/02/2015. Yesterday it was noted that the patient was becoming jaundiced. Hepatic function was obtained today and revealed an alkaline phosphatase 929, total bili 11.2, direct bili 7.2, indirect bili 4, ALT 1, AST 86. White blood count this morning is 10. Hemoglobin 9.8.       Objective:  Vital signs in last 24 hours: Temp:  [97.5 F (36.4 C)-98.6 F (37 C)] 97.6 F (36.4 C) (06/13 0459) Pulse Rate:  [68-73] 68 (06/13 0459) Resp:  [18-24] 18 (06/13 0459) BP: (108-126)/(57-66) 126/66 mmHg (06/13 0459) SpO2:  [97 %-100 %] 98 % (06/13 0459) Last BM Date: 06/04/15 General:   Lethargic but arousable and will insulin questions, juandiced Heart:  Regular rate and rhythm; no  murmurs Pulm;lungs clear anteriorly Abdomen:  Soft, mild tenderness RUQ, nondistended. Normal bowel sounds, without guarding, and without rebound.   Extremities:  bilat LE edema Neurologic:Lethargic, arousable, answers questions slowly   Intake/Output from previous day: 06/12 0701 - 06/13 0700 In: 1760 [P.O.:560; I.V.:1200] Out: 350 [Urine:350] Intake/Output this shift:    Lab Results:  Recent Labs  06/03/15 2121 06/04/15 0510 06/05/15 0345  WBC 9.0 8.8 10.0  HGB 9.6* 9.6* 9.8*  HCT 29.5* 29.3* 30.1*  PLT 98* 93* 111*   BMET  Recent Labs  06/03/15 0512 06/04/15 0510  NA 135 134*  K 4.0 4.2  CL 101 99*  CO2 25 24  GLUCOSE 107* 98  BUN 25* 24*  CREATININE 0.77 0.69  CALCIUM 7.9* 8.1*   LFT  Recent Labs  06/05/15 0959  PROT 4.9*  ALBUMIN 2.2*  AST 86*  ALT 111*  ALKPHOS 929*  BILITOT 11.2*  BILIDIR 7.2*  IBILI 4.0*      ASSESSMENT/PLAN:  #43. 81 year old male with duodenal adenocarcinoma metastatic to liver with chronic ongoing blood loss secondary to duodenal tumor who presented with melena and weakness. Large obstructing duodenal cancer nonamenable to endoscopic therapy and decision had been made in April 2016 not to pursue further endoscopies. Monitor hemoglobin and keep above 9.  #2. Abnormal LFTs and jaundice. Patient likely has biliary obstruction from his mass. Right upper quadrant ultrasound has been ordered and if demonstrates dilated ducts this would likely be from obstruction from the tumor. He would likely require permanent drain placement with  interventional radiology if he, family wishes  #3. Acute DVT. IVC filter placed by IR on 06/02/2015. Not a candidate for anticoagulation  #4. Altered mental status. Patient did receive Ativan yesterday but ammonia is elevated at 149. Patient has been started on lactulose    Principal Problem:   Symptomatic anemia Active Problems:   OBESITY, MORBID   HYPERTENSION, BENIGN   HLD  (hyperlipidemia)   Chronic diastolic CHF (congestive heart failure)   GI bleed   Gastrointestinal hemorrhage with melena     LOS: 4 days   Hvozdovic, Vita Barley PA-C 06/05/2015, Pager [phone]   ________________________________________________________________________  Velora Heckler GI MD note:  I personally examined the patient, reviewed the data and agree with the assessment and plan described above. I recommend abdominal US to try to confirm biliary obstruction.  Cymbalta recently started, can cause hepatotoxicity although I have never seen that.  If dilated bilary tree then a decision will need to be made about treatment.  He has duodenal obstruction, ERCP is not possible, and so PTC, drain placement would be probably the only option if he/family decides to treat. I explained that a drain such as this may be temporary if IR is able to advance a stent through the site of obstruction. They understand that may not be possible and that drain would be permanent.   Owens Loffler, MD [facility] Pager [phone]

## 2015-06-05 NOTE — Care Management Note (Signed)
Case Management Note  Patient Details  Name: James [name] MRN: [id number] Date of Birth: 28-Jan-1928  Subjective/Objective:    Spoke to dtr-James [name] about d/c plans-she wants to see tomorrow after biliary drain placed, then she can make a better decision about d/c plans.Home vs SNF.CSW notified.                Action/Plan:Monitor progress for d/c needs.   Expected Discharge Date:   (unknown)               Expected Discharge Plan:  [place]  In-House Referral:  Clinical Social Work  Discharge planning Services  CM Consult  Post Acute Care Choice:  Home Health (Active w/[facility] HHRN/PT/OT/aide/SW) Choice offered to:     DME Arranged:  N/A DME Agency:  NA  HH Arranged:  NA HH Agency:  NA  Status of Service:  In process, will continue to follow  Medicare Important Message Given:  Yes Date Medicare IM Given:  06/05/15 Medicare IM give by:  Dessa Phi Date Additional Medicare IM Given:    Additional Medicare Important Message give by:     If discussed at [place] of Stay Meetings, dates discussed:    Additional Comments:  Dessa Phi, RN 06/05/2015, 3:31 PM

## 2015-06-05 NOTE — Progress Notes (Signed)
[facility]  Patient Status: Active (receiving services up to time of hospitalization)  [facility] is providing the following services: RN, PT, OT, MSW and HHA  If patient discharges after hours, please call [phone].   James [name] 06/05/2015, 9:28 AM

## 2015-06-05 NOTE — Progress Notes (Signed)
IP PROGRESS NOTE  Subjective:   He was admitted 06/01/2015 with melanoma and severe anemia. He was transfused with packed red blood cells. A bilateral lower extremity Doppler on 06/02/2015 noted bilateral DVTs. He was referred to interventional radiology and underwent placement of IVC filter on 06/02/2015.  Mr. Glosser is lethargic this morning after receiving lorazepam yesterday. His daughter reports his performance status was much improved over the week prior to hospital admission.  Objective: Vital signs in last 24 hours: Blood pressure 126/66, pulse 68, temperature 97.6 F (36.4 C), temperature source Axillary, resp. rate 18, height 6' (1.829 m), weight 202 lb 13.2 oz (92 kg), SpO2 98 %.  Intake/Output from previous day: 06/12 0701 - 06/13 0700 In: 1760 [P.O.:560; I.V.:1200] Out: 350 [Urine:350]  Physical Exam:  HEENT: Scleral icterus Lungs: Clear anteriorly Cardiac: Regular rate and rhythm Abdomen: Tender in the right abdomen, no mass Extremities: Trace edema at the left ankle Skin: Jaundiced, ecchymosis at the right upper abdomen Neurologic: Lethargic, arousable, follows simple commands  Portacath/PICC-without erythema  Lab Results:  Recent Labs  06/04/15 0510 06/05/15 0345  WBC 8.8 10.0  HGB 9.6* 9.8*  HCT 29.3* 30.1*  PLT 93* 111*    BMET  Recent Labs  06/03/15 0512 06/04/15 0510  NA 135 134*  K 4.0 4.2  CL 101 99*  CO2 25 24  GLUCOSE 107* 98  BUN 25* 24*  CREATININE 0.77 0.69  CALCIUM 7.9* 8.1*    Studies/Results: No results found.  Medications: I have reviewed the patient's current medications.  Assessment/Plan: 1. Duodenal mass, abdominal/retroperitoneal lymphadenopathy, and liver metastases noted on a CT 03/01/2015  Upper endoscopy 03/03/2015 confirmed a duodenal mass, status post a biopsy with the final pathology confirming a carcinoma, immunohistochemical stains negative for lymphoma, neuroendocrine carcinoma, and melanoma markers.  Positive staining for CDX2, cytokeratin AE1/AE3, and CD10  PET scan 03/29/2015 showed a large intensely hypermetabolic mass circumferentially narrowing the second portion of the duodenum. Adjacent hypermetabolic lymph nodes medial to the duodenum mass. Irregular hypermetabolic nodal mass in the inferior aspect of the gastrohepatic ligament. Hypermetabolic nodal metastasis in the retroperitoneum left of the aorta. No abnormal metabolic activity within the enlarged inferior right lobe of the liver.  Cycle 1 FOLFOX 04/11/2015  Cycle 2 FOLFOX 04/25/2015  2. Gastric outlet obstruction secondary to #1, status post a palliative gastrojejunostomy 03/09/2015  3. 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 Portal vein thrombosis diagnosed in September 2015. Off of anticoagulation due to GI bleeding.  5. Left leg DVT December 2014. Off of anticoagulation due to GI bleeding.  Bilateral lower extremity DVTs confirmed on a Doppler 06/02/2015, status post IVC filter placement  6. History of coronary artery disease, status post an LAD drug-eluting stent July 2015  7. Remote history of melanoma of the right ear  8. 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 History of drug related ITP  10. Abdominal pain secondary to the duodenal mass/liver metastases/adenopathy  11. Neuropathy  12. Altered mental status-likely secondary to lorazepam versus hepatic encephalopathy  13. External hemorrhoid-referred to Dr. Excell Seltzer  14. Admission 05/15/2015 with a fever and altered mental status-potentially related to a systemic infection, one blood culture positive for  "gram variable rods "and  urine streptococcus pneumoniae antigen positive. Discharged 05/20/2015.   Mr. Stthomas was admitted with melanoma and severe anemia. The GI bleeding is likely secondary to the duodenal tumor. We will consult radiation oncology to consider palliative radiation. Mr. Bradway is not a candidate for additional systemic therapy unless he has a significant improvement in his performance status. Mr. Balke and his family have been reluctant to discuss Hospice care, but if he does not improve over the next 24-48 hours I will pursue this discussion.  Recommendations: 1. Discontinue lorazepam 2. Radiation oncology consult to consider palliative radiation to the duodenal mass  3. Check liver panel and ammonia level     LOS: 4 days   Jane Birkel  06/05/2015, 8:31 AM

## 2015-06-05 NOTE — Telephone Encounter (Signed)
Wayne advised.

## 2015-06-05 NOTE — Care Management Note (Signed)
Case Management Note  Patient Details  Name: James [name] MRN: [id number] Date of Birth: 05/15/1928  Subjective/Objective: Elevated ammonia, elevated lft's.GI following.for biliary drain.NPO. PT-SNF.Left vm w/dtr [name] to discuss d/c plan & recommendations.Await response. CSW also following.                 Action/Plan:Monitor progress for d/c plans.   Expected Discharge Date:   (unknown)               Expected Discharge Plan:  [place]  In-House Referral:  Clinical Social Work  Discharge planning Services  CM Consult  Post Acute Care Choice:  Home Health (Active w/[facility] HHRN/PT/OT/aide/SW) Choice offered to:     DME Arranged:  N/A DME Agency:  NA  HH Arranged:  NA HH Agency:  NA  Status of Service:  In process, will continue to follow  Medicare Important Message Given:  Yes Date Medicare IM Given:  06/05/15 Medicare IM give by:  Dessa Phi Date Additional Medicare IM Given:    Additional Medicare Important Message give by:     If discussed at [place] of Stay Meetings, dates discussed:    Additional Comments:  Dessa Phi, RN 06/05/2015, 3:16 PM

## 2015-06-05 NOTE — Telephone Encounter (Signed)
James [name] called wanting a verbal order for physical therapy to make up for a visit that was missed from last week.  Please call back at [phone]. Thanks.

## 2015-06-05 NOTE — Evaluation (Signed)
Physical Therapy Evaluation Patient Details Name: James [name] MRN: [id number] DOB: 1928-05-20 Today's Date: 06/05/2015   History of Present Illness  81 yo male admitted with GI bleed, anemia, IVC filter placed 6/10. Hx of duodenal carcinoma with liver mets, HTN, chronic anemia, bradycardia, morbid obesity, L TKA, DVT, CHF.   Clinical Impression  On eval, pt required Mod assist for mobility-only able to sit at EOB due to lethargy. Deferred OOB for safety reasons. At this time, recommend SNF unless mobility/alertness improve and/or family feel they can manage with pt at home.    Follow Up Recommendations SNF;Supervision/Assistance - 24 hour (unless alertness and mobility improve significantly OR family feels they can manage with pt at home)    Equipment Recommendations  None recommended by PT    Recommendations for Other Services OT consult     Precautions / Restrictions Precautions Precautions: Fall Restrictions Weight Bearing Restrictions: No      Mobility  Bed Mobility Overal bed mobility: Needs Assistance Bed Mobility: Supine to Sit;Sit to Supine     Supine to sit: Mod assist Sit to supine: Mod assist   General bed mobility comments: Assist for trunk and bil LEs. Sat EOB ~5 mintues-pt had difficulty keeping eyes open and maintaining sitting balance.   Transfers                 General transfer comment: NT-unable to attempt on today. Pt too lethargic  Ambulation/Gait                Stairs            Wheelchair Mobility    Modified Rankin (Stroke Patients Only)       Balance   Sitting-balance support: Feet supported Sitting balance-Leahy Scale: Poor                                       Pertinent Vitals/Pain Pain Assessment: No/denies pain    Home Living Family/patient expects to be discharged to:: Private residence Living Arrangements: Other (Comment) (lives with daughter) Available Help at Discharge: Family Type  of Home: House Home Access: Level entry     Home Layout: Able to live on main level with bedroom/bathroom;Two level Home Equipment: Cane - single point;Walker - 2 wheels;Shower seat - built in;Grab bars - toilet;Grab bars - tub/shower Additional Comments: Pt is assisted by dtr and multiple sisters    Prior Function Level of Independence: Independent with assistive device(s)               Hand Dominance        Extremity/Trunk Assessment   Upper Extremity Assessment: Generalized weakness           Lower Extremity Assessment: Generalized weakness      Cervical / Trunk Assessment: Normal  Communication   Communication: No difficulties  Cognition Arousal/Alertness: Lethargic Behavior During Therapy: Flat affect Overall Cognitive Status: Within Functional Limits for tasks assessed                      General Comments      Exercises        Assessment/Plan    PT Assessment Patient needs continued PT services  PT Diagnosis Difficulty walking;Generalized weakness   PT Problem List Decreased strength;Decreased activity tolerance;Decreased balance;Decreased mobility;Decreased cognition  PT Treatment Interventions DME instruction;Gait training;Functional mobility training;Therapeutic activities;Therapeutic exercise;Patient/family education;Balance training   PT Goals (  Current goals can be found in the Care Plan section) Acute Rehab PT Goals Patient Stated Goal: none stated PT Goal Formulation: With family Time For Goal Achievement: 06/19/15 Potential to Achieve Goals: Good    Frequency Min 3X/week   Barriers to discharge        Co-evaluation               End of Session   Activity Tolerance: Patient limited by fatigue (Limited by lethargy) Patient left: in bed;with call bell/phone within reach;with family/visitor present           Time: 1232-1249 PT Time Calculation (min) (ACUTE ONLY): 17 min   Charges:   PT Evaluation $Initial  PT Evaluation Tier I: 1 Procedure     PT G Codes:        Weston Anna, MPT Pager: [phone]

## 2015-06-05 NOTE — Progress Notes (Signed)
Patient Demographics  James [name], is a 81 y.o. male, DOB - 13-Oct-1928, CEY:[id number]  Admit date - 06/01/2015   Admitting Physician Allyne Gee, MD  Outpatient Primary MD for the patient is [name], MD  LOS - 4   Chief Complaint  Patient presents with  . Weakness  . 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 He was found to have small to medium esophageal varices and a very large circumferential ulcerated mass in the duodenal bulb. This is not amenable to endoscopic therapy. Presents with melena, found to have hemoglobin of 6.6,unfortunately his venous Doppler is positive for DVT. IVC filter inserted 06/02/15  Subjective:   James [name] today has, No headache, No chest pain, No abdominal pain - No Nausea, reports generalized weakness , no coffee-ground emesis, large bowel movement overnight, dark in color, but no evidence of blood. Assessment & Plan    Principal Problem:   Symptomatic anemia Active Problems:   OBESITY, MORBID   HYPERTENSION, BENIGN   HLD (hyperlipidemia)   Chronic diastolic CHF (congestive heart failure)   GI bleed   Gastrointestinal hemorrhage with melena  Symptomatic anemia secondary to GI bleed : -Transfused 3 units PRBC 06/02/15, and 1 unit on 06/03/15,   goal is to keep hemoglobin > 9 . Hemoglobin is 9.6 today. - Ferritin within normal range, but low TIBC and iron, will discuss with hematology if this indication for IV Feraheme  Upper GI bleed  - Patient had slow ongoing chronic GI bleed secondary to metastatic duodenal adenocarcinoma bleed , this is unlikely related to varices bleed , octreotide stopped by GI , no indication for EGD at this  point . Will advance to soft diet as hemoglobin been stable. - Monitor hemoglobin closely and keep > 9  -oncology will consult radiation oncology.  Jaundiced/LFT - Patient has significantly elevated LFTs, mainly alkaline phosphatase, and direct bilirubin, will obtain right upper quadrant ultrasound to see if patient has obstructive jaundice.  Altered mental status - Patient appears to be more lethargic today, received Ativan yesterday, but ammonia level is significantly elevated at 149, will start on lactulose.  Acute DVT  - At this point patient is contraindicated for anticoagulation , IVC filter placed by IR 4/97/53  Chronic diastolic CHF - Last EF 00-51% - Appears compensated, continue with Lasix and Aldactone given multiple transfusions.  Duodenal Adenocarcinoma with metastasis to liver - Oncology consult appreciated       Code Status:  full  Family Communication:Discussed with daughter at bedside 6/13 despostion Plan: Pending further workup   Procedures  3units PRBC transfusion 6/10 1 unit PRBC transfusion 6/11   Consults   GI  IR Oncology Radiation oncology  Medications  Scheduled Meds: . antiseptic oral rinse  7 mL Mouth Rinse q12n4p  . chlorhexidine  15 mL Mouth Rinse BID  . cholecalciferol  2,000 Units Oral Daily  . DULoxetine  30 mg Oral BID  . feeding supplement (ENSURE ENLIVE)  237 mL Oral TID BM  . finasteride  5 mg Oral  Daily  . folic acid  1 mg Oral Daily  . furosemide  20 mg Oral Daily  . hydrocortisone-pramoxine  1 applicator Rectal BID  . insulin aspart  0-15 Units Subcutaneous TID WC  . iron polysaccharides  150 mg Oral BID  . lactulose  30 g Oral TID  . levothyroxine  50 mcg Oral QAC breakfast  . multivitamin with minerals  1 tablet Oral Daily  . pantoprazole (PROTONIX) IV  40 mg Intravenous Q12H  . polyethylene glycol  17 g Oral BID  . pravastatin  80 mg Oral q1800  . saccharomyces boulardii  250 mg Oral Daily  . sodium chloride   10-40 mL Intracatheter Q12H  . sodium chloride  3 mL Intravenous Q12H  . spironolactone  12.5 mg Oral Daily  . thiamine  100 mg Oral Daily   Continuous Infusions: . sodium chloride 50 mL/hr at 06/04/15 2344   PRN Meds:.benzonatate, dibucaine, nitroGLYCERIN, ondansetron **OR** ondansetron (ZOFRAN) IV, oxyCODONE-acetaminophen, sodium chloride, sodium chloride  DVT Prophylaxis  No chemical anticoagulation due to GI bleed, no SCD due to acute DVT. Has IVC filter.  Lab Results  Component Value Date   PLT 111* 06/05/2015    Antibiotics    Anti-infectives    Start     Dose/Rate Route Frequency Ordered Stop   06/01/15 2100  fluconazole (DIFLUCAN) tablet 100 mg  Status:  Discontinued     100 mg Oral Daily 06/01/15 2050 06/01/15 2108          Objective:   Filed Vitals:   06/04/15 1446 06/04/15 1956 06/04/15 2242 06/05/15 0459  BP: 118/57 108/60 123/64 126/66  Pulse: 69 73 73 68  Temp: 97.5 F (36.4 C) 98.6 F (37 C) 97.8 F (36.6 C) 97.6 F (36.4 C)  TempSrc: Oral Oral Axillary Axillary  Resp: 18 24 20 18   Height:      Weight:      SpO2: 97% 100% 99% 98%    Wt Readings from Last 3 Encounters:  06/03/15 92 kg (202 lb 13.2 oz)  05/29/15 92.534 kg (204 lb)  05/25/15 92.534 kg (204 lb)     Intake/Output Summary (Last 24 hours) at 06/05/15 1118 Last data filed at 06/05/15 0700  Gross per 24 hour  Intake   1170 ml  Output    350 ml  Net    820 ml     Physical Exam  Awake Alert, Oriented , sleepy, jaundiced, No new F.N deficits, Normal affect [place].AT,PERRAL Supple Neck,No JVD, No cervical lymphadenopathy appriciated.  Symmetrical Chest wall movement, Good air movement bilaterally,  RRR,No Gallops,Rubs or new Murmurs, No Parasternal Heave +ve B.Sounds, Abd Soft, No tenderness, No organomegaly appriciated, No rebound - guarding or rigidity. No Cyanosis, B/L  edema, No new Rash or bruise    Data Review   Micro Results No results found for this or any previous  visit (from the past 240 hour(s)).  Radiology Reports Dg Chest 2 View  05/16/2015   CLINICAL DATA:  Shortness of breath with fever and chills. Metastatic duodenum cancer  EXAM: CHEST  2 VIEW  COMPARISON:  May 15, 2015  FINDINGS: Port-A-Cath tip is in the superior vena cava. No pneumothorax. There is no edema or consolidation. There is slight bibasilar lung scarring. Heart is upper limits normal in size with pulmonary vascularity within normal limits. No adenopathy. No bone lesions.  IMPRESSION: Slight bibasilar lung scarring.  No edema or consolidation.   Electronically Signed   By: Lowella Grip III  M.D.   On: 05/16/2015 11:46   Dg Chest 2 View  05/15/2015   CLINICAL DATA:  Pt from home, complains of emesis and dizziness, denies weakness. Hx colon cancer.  EXAM: CHEST  2 VIEW  COMPARISON:  Cough 04/12/2015  FINDINGS: Cardiac silhouette mildly enlarged. No mediastinal or hilar masses or evidence of adenopathy.  Minor reticular scarring in the antral lateral lung bases. This is unchanged. No lung consolidation or edema. No pleural effusion or pneumothorax.  Right anterior chest wall Port-A-Cath is stable.  Bony thorax is intact.  IMPRESSION: No acute cardiopulmonary disease.   Electronically Signed   By: Lajean Manes M.D.   On: 05/15/2015 17:08   Ir Ivc Filter Plmt / S&i /img Guid/mod Sed  06/02/2015   CLINICAL DATA:  Bilateral lower extremity DVT, duodenum carcinoma and GI bleed. The patient requires placement of an IVC filter for protection from thromboembolic disease.  EXAM: 1. ULTRASOUND GUIDANCE FOR VASCULAR ACCESS OF THE RIGHT COMMON FEMORAL VEIN. 2. IVC VENOGRAM. 3. PERCUTANEOUS IVC FILTER PLACEMENT.  ANESTHESIA/SEDATION: 1.0 mg IV Versed; 25 mcg IV Fentanyl.  Total Moderate Sedation Time  30minutes.  CONTRAST:  37mL OMNIPAQUE IOHEXOL 300 MG/ML SOLN, 66mL OMNIPAQUE IOHEXOL 300 MG/ML SOLN  FLUOROSCOPY TIME:  1 minutes and 18 seconds.  PROCEDURE: The procedure, risks, benefits, and alternatives  were explained to the patient. Questions regarding the procedure were encouraged and answered. The patient understands and consents to the procedure.  A time-out was performed prior to the procedure. The right groin was prepped with Betadine in a sterile fashion, and a sterile drape was applied covering the operative field. A sterile gown and sterile gloves were used for the procedure. Local anesthesia was provided with 1% Lidocaine.  Patency of the right common femoral vein was confirmed by ultrasound. Under direct ultrasound guidance, a 21 gauge needle was advanced into the right common femoral vein with ultrasound image documentation performed. After securing access with a micropuncture dilator, a guidewire was advanced into the inferior vena cava. A deployment sheath was advanced over the guidewire. This was utilized to perform IVC venography.  The deployment sheath was further positioned in an appropriate location for filter deployment. A Bard Denali IVC filter was then advanced in the sheath. This was then fully deployed in the infrarenal IVC. Final filter position was confirmed with a fluoroscopic spot image. Contrast injection was also performed through the sheath under fluoroscopy to confirm patency of the IVC at the level of the filter. After the procedure the sheath was removed and hemostasis obtained with manual compression.  COMPLICATIONS: None.  FINDINGS: IVC venography demonstrates a normal caliber IVC with no evidence of thrombus. Renal veins are identified bilaterally. The IVC filter was successfully positioned below the level of the renal veins and is appropriately oriented. This IVC filter has both permanent and retrievable indications.  IMPRESSION: Placement of percutaneous IVC filter in infrarenal IVC. IVC venogram shows no evidence of IVC thrombus and normal caliber of the inferior vena cava. This filter does have both permanent and retrievable indications. Given the patient's history of  metastatic malignancy and poor performance status, the filter will be assumed to be a permanent filter.   Electronically Signed   By: Aletta Edouard M.D.   On: 06/02/2015 17:14     CBC  Recent Labs Lab 06/02/15 0500 06/02/15 1800 06/03/15 0512 06/03/15 2121 06/04/15 0510 06/05/15 0345  WBC 11.0*  --  8.8 9.0 8.8 10.0  HGB 8.1* 8.8* 8.3* 9.6* 9.6* 9.8*  HCT 24.9* 27.0* 25.6* 29.5* 29.3* 30.1*  PLT 94*  --  97* 98* 93* 111*  MCV 92.9  --  93.1 91.9 91.8 91.5  MCH 30.2  --  30.2 29.9 30.1 29.8  MCHC 32.5  --  32.4 32.5 32.8 32.6  RDW 21.2*  --  20.8* 20.5* 20.6* 20.1*    Chemistries   Recent Labs Lab 06/01/15 1755 06/02/15 0500 06/03/15 0512 06/04/15 0510 06/05/15 0959  NA 132* 133* 135 134*  --   K 4.8 4.1 4.0 4.2  --   CL 102 101 101 99*  --   CO2 20* 23 25 24   --   GLUCOSE 155* 141* 107* 98  --   BUN 29* 26* 25* 24*  --   CREATININE 0.89 0.86 0.77 0.69  --   CALCIUM 8.0* 8.0* 7.9* 8.1*  --   AST 61* 45*  --   --  86*  ALT 70* 69*  --   --  111*  ALKPHOS 380* 376*  --   --  929*  BILITOT 1.9* 2.9*  --   --  11.2*   ------------------------------------------------------------------------------------------------------------------ estimated creatinine clearance is 75.4 mL/min (by C-G formula based on Cr of 0.69). ------------------------------------------------------------------------------------------------------------------ No results for input(s): HGBA1C in the last 72 hours. ------------------------------------------------------------------------------------------------------------------ No results for input(s): CHOL, HDL, LDLCALC, TRIG, CHOLHDL, LDLDIRECT in the last 72 hours. ------------------------------------------------------------------------------------------------------------------ No results for input(s): TSH, T4TOTAL, T3FREE, THYROIDAB in the last 72 hours.  Invalid input(s):  FREET3 ------------------------------------------------------------------------------------------------------------------  Recent Labs  06/04/15 1030  FERRITIN 103  TIBC 202*  IRON 11*    Coagulation profile  Recent Labs Lab 06/02/15 0500  INR 1.35    No results for input(s): DDIMER in the last 72 hours.  Cardiac Enzymes No results for input(s): CKMB, TROPONINI, MYOGLOBIN in the last 168 hours.  Invalid input(s): CK ------------------------------------------------------------------------------------------------------------------ Invalid input(s): POCBNP     Time Spent in minutes   30 minutes   Sun James [name] M.D on 06/05/2015 at 11:18 AM  Between 7am to 7pm - Pager - [phone]  After 7pm go to [email or web address] - password [facility]  [facility]   Office  [phone]

## 2015-06-05 NOTE — Telephone Encounter (Signed)
Please give the order.  Thanks.   

## 2015-06-05 NOTE — Progress Notes (Signed)
PT Cancellation Note  Patient Details Name: James [name] MRN: [id number] DOB: 02-03-1928   Cancelled Treatment:    Reason Eval/Treat Not Completed: Other (comment) (pt currently eating lunch. Will check back at another time. thanks. )   Weston Anna, MPT Pager: [phone]

## 2015-06-06 ENCOUNTER — Ambulatory Visit: Payer: Medicare Other | Admitting: Podiatry

## 2015-06-06 ENCOUNTER — Encounter: Payer: Self-pay | Admitting: Oncology

## 2015-06-06 ENCOUNTER — Encounter: Payer: Self-pay | Admitting: Cardiology

## 2015-06-06 ENCOUNTER — Encounter: Payer: Self-pay | Admitting: Family Medicine

## 2015-06-06 DIAGNOSIS — J690 Pneumonitis due to inhalation of food and vomit: Secondary | ICD-10-CM

## 2015-06-06 DIAGNOSIS — K831 Obstruction of bile duct: Secondary | ICD-10-CM | POA: Insufficient documentation

## 2015-06-06 DIAGNOSIS — N183 Chronic kidney disease, stage 3 (moderate): Secondary | ICD-10-CM

## 2015-06-06 DIAGNOSIS — K7290 Hepatic failure, unspecified without coma: Secondary | ICD-10-CM

## 2015-06-06 DIAGNOSIS — R188 Other ascites: Secondary | ICD-10-CM

## 2015-06-06 DIAGNOSIS — I129 Hypertensive chronic kidney disease with stage 1 through stage 4 chronic kidney disease, or unspecified chronic kidney disease: Secondary | ICD-10-CM

## 2015-06-06 DIAGNOSIS — I2699 Other pulmonary embolism without acute cor pulmonale: Secondary | ICD-10-CM

## 2015-06-06 DIAGNOSIS — Q441 Other congenital malformations of gallbladder: Secondary | ICD-10-CM

## 2015-06-06 DIAGNOSIS — I5030 Unspecified diastolic (congestive) heart failure: Secondary | ICD-10-CM

## 2015-06-06 LAB — HEPATIC FUNCTION PANEL
ALT: 118 U/L — AB (ref 17–63)
AST: 87 U/L — AB (ref 15–41)
Albumin: 2.2 g/dL — ABNORMAL LOW (ref 3.5–5.0)
Alkaline Phosphatase: 981 U/L — ABNORMAL HIGH (ref 38–126)
BILIRUBIN DIRECT: 7.9 mg/dL — AB (ref 0.1–0.5)
BILIRUBIN INDIRECT: 4.1 mg/dL — AB (ref 0.3–0.9)
Total Bilirubin: 12 mg/dL — ABNORMAL HIGH (ref 0.3–1.2)
Total Protein: 5 g/dL — ABNORMAL LOW (ref 6.5–8.1)

## 2015-06-06 LAB — CBC
HEMATOCRIT: 28.9 % — AB (ref 39.0–52.0)
Hemoglobin: 9.3 g/dL — ABNORMAL LOW (ref 13.0–17.0)
MCH: 29.4 pg (ref 26.0–34.0)
MCHC: 32.2 g/dL (ref 30.0–36.0)
MCV: 91.5 fL (ref 78.0–100.0)
Platelets: 119 10*3/uL — ABNORMAL LOW (ref 150–400)
RBC: 3.16 MIL/uL — ABNORMAL LOW (ref 4.22–5.81)
RDW: 20 % — AB (ref 11.5–15.5)
WBC: 7.5 10*3/uL (ref 4.0–10.5)

## 2015-06-06 LAB — BASIC METABOLIC PANEL
Anion gap: 10 (ref 5–15)
BUN: 20 mg/dL (ref 6–20)
CALCIUM: 8 mg/dL — AB (ref 8.9–10.3)
CO2: 20 mmol/L — ABNORMAL LOW (ref 22–32)
Chloride: 99 mmol/L — ABNORMAL LOW (ref 101–111)
Creatinine, Ser: 0.51 mg/dL — ABNORMAL LOW (ref 0.61–1.24)
GFR calc Af Amer: >60 mL/min (ref >60)
GLUCOSE: 113 mg/dL — AB (ref 65–99)
Potassium: 4.1 mmol/L (ref 3.5–5.1)
Sodium: 129 mmol/L — ABNORMAL LOW (ref 135–145)

## 2015-06-06 LAB — GLUCOSE, CAPILLARY
Glucose-Capillary: 108 mg/dL — ABNORMAL HIGH (ref 65–99)
Glucose-Capillary: 92 mg/dL (ref 65–99)
Glucose-Capillary: 113 mg/dL — ABNORMAL HIGH (ref 65–99)
Glucose-Capillary: 128 mg/dL — ABNORMAL HIGH (ref 65–99)
Glucose-Capillary: 141 mg/dL — ABNORMAL HIGH (ref 65–99)

## 2015-06-06 LAB — AMMONIA: AMMONIA: 170 umol/L — AB (ref 9–35)

## 2015-06-06 LAB — PROTIME-INR
INR: 1.4 (ref 0.00–1.49)
PROTHROMBIN TIME: 17.2 s — AB (ref 11.6–15.2)

## 2015-06-06 MED ORDER — LACTULOSE 10 GM/15ML PO SOLN
45.0000 g | Freq: Four times a day (QID) | ORAL | Status: DC
  Administered 2015-06-06 (×4): 45 g via ORAL
  Filled 2015-06-06 (×7): qty 90

## 2015-06-06 NOTE — Care Management Note (Signed)
Case Management Note  Patient Details  Name: James [name] MRN: [id number] Date of Birth: 12/16/1928  Subjective/Objective: Palliative cons.                   Action/Plan:Monitor progress for d/c needs.   Expected Discharge Date:   (unknown)               Expected Discharge Plan:  [place]  In-House Referral:  Clinical Social Work  Discharge planning Services  CM Consult  Post Acute Care Choice:  Home Health (Active w/[facility] HHRN/PT/OT/aide/SW) Choice offered to:     DME Arranged:  N/A DME Agency:  NA  HH Arranged:  NA HH Agency:  NA  Status of Service:  In process, will continue to follow  Medicare Important Message Given:  Yes Date Medicare IM Given:  06/05/15 Medicare IM give by:  Dessa Phi Date Additional Medicare IM Given:    Additional Medicare Important Message give by:     If discussed at [place] of Stay Meetings, dates discussed:  06/06/15  Additional Comments:  Dessa Phi, RN 06/06/2015, 11:48 AM

## 2015-06-06 NOTE — H&P (Signed)
Reason for Consult: Obstructive jaundice   Chief Complaint: Chief Complaint  Patient presents with  . Weakness  . Melena   Referring Physician(s): TR[facility]  History of Present Illness: James [name] is a 81 y.o. male with metastatic duodenal adenocarcinoma and biliary obstruction. IR received request for consult to discuss the risks and benefits regarding percutaneous biliary catheter placement. History is obtained by family and chart review as patient is lethargic. Per family patient denies any pain, itching or abdominal bloating. The family states just last week the patient was shoe shopping with them and alert and oriented within the last 2 days he has become very lethargic and increased yellowing of his skin.   Past Medical History  Diagnosis Date  . Bradycardia     Hypertensive  . carotid bruit, right   . hypercholesterolemia     Trig 234/293;takes Simcor daily  . Obesity, morbid   . Vasculitis   . 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 Beryle Beams; Dr. Beryle Beams , normal platelets  . Dermatitis     legs  . Bleeding gums   . Chronic low back pain   . Myalgia and myositis   . Unspecified vitamin D deficiency   . Bladder diverticulum   . Primary osteoarthritis of both knees   . Nephrolithiasis   . History of elevated glucose   . Thrombophlebitis     right forearm  . History of BPH   . History of colonic polyps     [name]  . Epididymitis, left     S/P Epidimectomy  . Splenomegaly 05/15/09    abd U/S NML  . Hypertension, benign     takes Micardis daily  . Neuropathy     acute  . Bruises easily   . H/O hiatal hernia   . GERD (gastroesophageal reflux disease)     hx of but doesn't take any meds now  . Urinary frequency   . Urinary urgency   . Nocturia   . Urinary leakage   . Abnormality of gait 03/19/2013  . DVT of lower limb, acute 11/19/2013    Peroneal & distal tibial left 11/01/13  . Polio 1941    "mild case"  .  CHF (congestive heart failure)     "low grade" (06/22/2014)  . DVT (deep venous thrombosis) 10/2013    LLE  . Pneumonia, bacterial     RML resolved, spirometry, normal  . Pneumonia 1933  . History of blood transfusion 05/2014  . Anemia 05/2014    "related to Xarelto"  . Iron deficiency anemia 05/2014    "got iron infusion"  . Arthritis     "all over"  . Gout   . Malignant melanoma of ear     right  . Thrombophlebitis of superficial veins of right lower extremity 06/23/2014    Incidental finding doppler 05/30/14  . Shortness of breath   . Esophageal varices without mention of bleeding 08/08/2014  . Portal vein thrombosis   . Duodenal cancer     Past Surgical History  Procedure Laterality Date  . Cystoscopy w/ stone manipulation  1950's  . Fem cutaneous nerve entrapment  1977    due to surgery (mild lat anesth of bilat legs)  . Limited pelvis/hip bone scan  04/99    negative  . Bone scan  04/00  . Cataract extraction w/ intraocular lens  implant, bilateral Bilateral ~ 2004  . Melanoma excision Right 06/04    ear; Wide  local excision,,with flap  . Stress cardiolite  05/11/04    mild inf. ischemia, EF 71%  . Shoulder surgery Left 06/17/2005    "tore it all to pieces when I fell; not fractured"  . Bronchoscopy  09/08    RML collapse with chronic pneumonia, bx neg (Dr. Joya Gaskins)  . Cyst excision Right 03/07/09    middle finger, DIP Joint Mucoid (Dr. Fredna Dow)  . Eyelid & eyebrow lift  1996  . Cyst excision Right 08/15/09    middle finger, DIP Joint Mucoid Cyst (Dr. Fredna Dow)  . Appendectomy  1950  . Excision of mucoid tumor    . Colonoscopy    . Total knee arthroplasty  01/10/2012    Procedure: TOTAL KNEE ARTHROPLASTY;  Surgeon: Augustin Schooling, MD;  Location: [place];  Service: Orthopedics;  Laterality: Left;  . Surgery scrotal / testicular Left ~ 2000    removal of mass, noncancerous  . Cardiac catheterization  05/23/04    mild plague-statin  . Coronary angioplasty with stent placement   06/22/2014    to proximal LAD  . Inguinal hernia repair Bilateral 1959  . Inguinal hernia repair Left 1970's  . Esophagogastroduodenoscopy N/A 08/08/2014    Procedure: ESOPHAGOGASTRODUODENOSCOPY (EGD);  Surgeon: Gatha Mayer, MD;  Location: [facility] ENDOSCOPY;  Service: Endoscopy;  Laterality: N/A;  . Colonoscopy N/A 08/08/2014    Procedure: COLONOSCOPY;  Surgeon: Gatha Mayer, MD;  Location: [place];  Service: Endoscopy;  Laterality: N/A;  . Flexible sigmoidoscopy N/A 09/21/2014    Procedure: FLEXIBLE SIGMOIDOSCOPY;  Surgeon: Jerene Bears, MD;  Location: [place];  Service: Gastroenterology;  Laterality: N/A;  . Givens capsule study N/A 09/21/2014    Procedure: GIVENS CAPSULE STUDY;  Surgeon: Jerene Bears, MD;  Location: [facility] ENDOSCOPY;  Service: Endoscopy;  Laterality: N/A;  . Left and right heart catheterization with coronary angiogram N/A 06/22/2014    Procedure: LEFT AND RIGHT HEART CATHETERIZATION WITH CORONARY ANGIOGRAM;  Surgeon: Larey Dresser, MD;  Location: [facility] CATH LAB;  Service: Cardiovascular;  Laterality: N/A;  . Percutaneous coronary stent intervention (pci-s)  06/22/2014    Procedure: PERCUTANEOUS CORONARY STENT INTERVENTION (PCI-S);  Surgeon: Larey Dresser, MD;  Location: [facility] CATH LAB;  Service: Cardiovascular;;  . Colonoscopy N/A 01/25/2015    Procedure: COLONOSCOPY;  Surgeon: Gatha Mayer, MD;  Location: [facility] ENDOSCOPY;  Service: Endoscopy;  Laterality: N/A;  . Cystoscopy with biopsy Right 02/03/2015    Procedure: CYSTOSCOPY WITH COLD CUP BLADDER BIOPSY CAUTHERIZAITON OF RIGHT BLADDER CANCER AND SATILITE, TURBT RIGHT BLADDER BASE;  Surgeon: Ailene Rud, MD;  Location: [facility] ORS;  Service: Urology;  Laterality: Right;  . Esophagogastroduodenoscopy N/A 03/03/2015    Procedure: ESOPHAGOGASTRODUODENOSCOPY (EGD);  Surgeon: Jerene Bears, MD;  Location: [facility] ENDOSCOPY;  Service: Gastroenterology;  Laterality: N/A;  . Gastrojejunostomy N/A 03/09/2015    Procedure: LAPAROSCOPIC  GASTROJEJUNOSTOMY;  Surgeon: Excell Seltzer, MD;  Location: [facility] ORS;  Service: General;  Laterality: N/A;  . Portacath placement Right 04/05/2015    Procedure: INSERTION PORT-A-CATH;  Surgeon: Excell Seltzer, MD;  Location: [place];  Service: General;  Laterality: Right;  . Esophagogastroduodenoscopy N/A 04/14/2015    Procedure: ESOPHAGOGASTRODUODENOSCOPY (EGD);  Surgeon: Jerene Bears, MD;  Location: [facility] ENDOSCOPY;  Service: Endoscopy;  Laterality: N/A;  bedside case    Allergies: Sulfa antibiotics; Aleve; Rofecoxib; Sulfamethoxazole-trimethoprim; Tape; Tramadol hcl; Neomycin-bacitracin zn-polymyx; Neosporin; and Penicillins  Medications: Prior to Admission medications   Medication Sig Start Date End Date Taking? Authorizing Provider  benzonatate (TESSALON) 200 MG capsule Take 1 capsule (200 mg total) by mouth 3 (three) times daily as needed. 04/24/15  Yes Tonia Ghent, MD  Cholecalciferol (VITAMIN D3) 2000 UNITS capsule Take 2,000 Units by mouth every morning.    Yes Historical Provider, MD  dibucaine (NUPERCAINAL) 1 % ointment Apply topically 3 (three) times daily as needed for pain. 04/24/15  Yes Tonia Ghent, MD  DULoxetine (CYMBALTA) 30 MG capsule One capsule daily for one week, then take one capsule twice a day 05/29/15  Yes Kathrynn Ducking, MD  feeding supplement, ENSURE ENLIVE, (ENSURE ENLIVE) LIQD Take 237 mLs by mouth 3 (three) times daily between meals. 03/16/15  Yes Debbe Odea, MD  finasteride (PROSCAR) 5 MG tablet Take 5 mg by mouth daily.   Yes Historical Provider, MD  furosemide (LASIX) 40 MG tablet Take 0.5 tablets (20 mg total) by mouth daily. 04/17/15  Yes Oswald Hillock, MD  hydrocortisone (ANUSOL-HC) 2.5 % rectal cream Apply topically 2 (two) times daily. Patient taking differently: Apply topically 2 (two) times daily as needed for hemorrhoids or itching.  04/17/15  Yes Oswald Hillock, MD  levothyroxine (SYNTHROID, LEVOTHROID) 50 MCG tablet Take 1 tablet (50  mcg total) by mouth daily before breakfast. 05/19/15  Yes Donne Hazel, MD  lidocaine-prilocaine (EMLA) cream Apply 1 application topically as needed. Patient taking differently: Apply 1 application topically as needed (to port site.).  04/06/15  Yes Owens Shark, NP  LIVALO 4 MG TABS TAKE 1 TABLET AT BEDTIME 10/03/14  Yes Jolaine Artist, MD  loperamide (IMODIUM A-D) 2 MG tablet Take 1 tablet (2 mg total) by mouth 2 (two) times daily as needed for diarrhea or loose stools. 04/24/15  Yes Tonia Ghent, MD  Multiple Vitamin (MULTIVITAMIN WITH MINERALS) TABS tablet Take 1 tablet by mouth daily.   Yes Historical Provider, MD  nitroGLYCERIN (NITROSTAT) 0.4 MG SL tablet Place 1 tablet (0.4 mg total) under the tongue every 5 (five) minutes as needed. For chest pain 05/24/15  Yes Larey Dresser, MD  OVER THE COUNTER MEDICATION Place 2 drops into both eyes 4 (four) times daily. Wash type eye drop-   Yes Historical Provider, MD  oxyCODONE-acetaminophen (ROXICET) 5-325 MG per tablet Take 1 tablet by mouth every 4 (four) hours as needed for severe pain. 03/21/15  Yes Ladell Pier, MD  pantoprazole (PROTONIX) 40 MG tablet TAKE 1 TABLET DAILY Patient taking differently: TAKE 1 TABLET TWICE DAILY 05/08/15  Yes Larey Dresser, MD  POLY-IRON 150 FORTE 315-94-5 MG-MCG-MG CAPS TAKE 1 CAPSULE TWICE A DAY 05/08/15  Yes Larey Dresser, MD  pramoxine-hydrocortisone (PROCTOCREAM-HC) 1-1 % rectal cream Place 1 application rectally 2 (two) times daily.   Yes Historical Provider, MD  predniSONE (DELTASONE) 5 MG tablet Take 5-60 mg by mouth as directed. Starting 05/22/15:  Take 12 tabs daily x 4 days, take 8 tabs daily x 4 days, take 4 tabs daily x 4 days, take 2 tabs daily x 4 days, then resume 5 mg daily.   Yes Historical Provider, MD  [place]; [place].   Yes Historical Provider, MD  Probiotic Product (PROBIOTIC & ACIDOPHILUS EX ST PO) Take 1 tablet by mouth every morning.    Yes Historical Provider, MD   prochlorperazine (COMPAZINE) 5 MG tablet Take 1 tablet (5 mg total) by mouth every 6 (six) hours as needed for nausea or vomiting. 03/21/15  Yes Ladell Pier, MD  Psyllium (METAMUCIL PO) Take  20 mLs by mouth daily as needed (constipation).    Yes Historical Provider, MD  spironolactone (ALDACTONE) 25 MG tablet Take 0.5 tablets (12.5 mg total) by mouth daily. 04/17/15  Yes Oswald Hillock, MD  fluconazole (DIFLUCAN) 100 MG tablet Take 1 tablet (100 mg total) by mouth daily. Patient not taking: Reported on 06/01/2015 05/23/15   Owens Shark, NP  pramoxine (PROCTOFOAM) 1 % foam Place 1 application rectally 3 (three) times daily as needed for itching. Patient not taking: Reported on 06/01/2015 04/24/15   Tonia Ghent, MD  predniSONE (DELTASONE) 5 MG tablet Take 1 tablet (5 mg total) by mouth daily with breakfast. 03/31/15   Owens Shark, NP     Family History  Problem Relation Age of Onset  . Thyroid cancer Mother   . Diabetes Father   . Heart disease Father   . Stroke Brother     cerebral hemm  . Colon cancer Brother   . Anesthesia problems Neg Hx   . Hypotension Neg Hx   . Malignant hyperthermia Neg Hx   . Pseudochol deficiency Neg Hx   . Prostate cancer Neg Hx   . Heart attack Father     History   Social History  . Marital Status: Widowed    Spouse Name: N/A  . Number of Children: 2  . Years of Education: college   Occupational History  . Retired     1996 [employer]   Social History Main Topics  . Smoking status: Former Smoker -- 2.00 packs/day for 20 years    Types: Cigarettes, Cigars    Quit date: 09/27/1967  . Smokeless tobacco: Never Used  . Alcohol Use: No  . Drug Use: No  . Sexual Activity: Not on file   Other Topics Concern  . None   Social History Narrative   Widowed 2013 after 63+ years of marriage, Wife had CHF and multiple myeloma   James [name], daughter in Sports coach, lives with patient   Patient does not get regular exercise   No caffeine   Retired from  [employer] ([employer]), retired from [employer]    Patient is left handed.   Review of Systems: A 12 point ROS discussed and pertinent positives are indicated in the HPI above.  All other systems are negative.  Review of Systems  Vital Signs: BP 129/61 mmHg  Pulse 66  Temp(Src) 97.5 F (36.4 C) (Oral)  Resp 20  Ht 6' (1.829 m)  Wt 206 lb 2.1 oz (93.5 kg)  BMI 27.95 kg/m2  SpO2 99%  Physical Exam General: Lethargic, arousable  Jaundice  Abd: Distended   Mallampati Score:  MD Evaluation Airway: WNL Heart: WNL Abdomen: WNL Chest/ Lungs: WNL ASA  Classification: 3 Mallampati/Airway Score: Two  Imaging: Dg Chest 2 View  05/16/2015   CLINICAL DATA:  Shortness of breath with fever and chills. Metastatic duodenum cancer  EXAM: CHEST  2 VIEW  COMPARISON:  May 15, 2015  FINDINGS: Port-A-Cath tip is in the superior vena cava. No pneumothorax. There is no edema or consolidation. There is slight bibasilar lung scarring. Heart is upper limits normal in size with pulmonary vascularity within normal limits. No adenopathy. No bone lesions.  IMPRESSION: Slight bibasilar lung scarring.  No edema or consolidation.   Electronically Signed   By: Lowella Grip III M.D.   On: 05/16/2015 11:46   Dg Chest 2 View  05/15/2015   CLINICAL DATA:  Pt from home, complains  of emesis and dizziness, denies weakness. Hx colon cancer.  EXAM: CHEST  2 VIEW  COMPARISON:  Cough 04/12/2015  FINDINGS: Cardiac silhouette mildly enlarged. No mediastinal or hilar masses or evidence of adenopathy.  Minor reticular scarring in the antral lateral lung bases. This is unchanged. No lung consolidation or edema. No pleural effusion or pneumothorax.  Right anterior chest wall Port-A-Cath is stable.  Bony thorax is intact.  IMPRESSION: No acute cardiopulmonary disease.   Electronically Signed   By: Lajean Manes M.D.   On: 05/15/2015 17:08   Ct Abdomen W Contrast  06/05/2015   CLINICAL DATA:  Biliary obstruction.  EXAM:  CT ABDOMEN WITH CONTRAST  TECHNIQUE: Multidetector CT imaging of the abdomen was performed using the standard protocol following bolus administration of intravenous contrast.  CONTRAST:  111m OMNIPAQUE IOHEXOL 300 MG/ML  SOLN  COMPARISON:  Multiple exams, including 03/29/2015 and 03/01/2015  FINDINGS: Lower chest: Today' s exam was not a CT angiogram of the chest. Nevertheless we demonstrate a large amount of clot in the left lower lobe pulmonary arterial tree, inclined also in the right middle lobe and right lower lobe pulmonary arterial tree. Coronary artery atherosclerosis is present. contrast medium is present in the esophagus. Thoracic aortic atherosclerotic calcification is observed. The patient does have a known IVC filter.  Hepatobiliary: Moderate intrahepatic biliary dilatation diffusely due to likely obstruction of the common bile duct by the large duodenal mass. New metastatic lesion in segment 6 of the liver, 15.7 by 8.6 cm. Gallbladder poorly seen. Poor definition of the left hepatic vein. 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  Pancreas: Unremarkable  Spleen: Unremarkable  Adrenals/Urinary Tract: 3.9 by 3.3 cm right adrenal myelolipoma.  Stomach/Bowel: The descending duodenum mass is much larger, currently 8.9 by 9.3 cm, formerly 5.8 by 6.9 cm on the prior PET-CT. Adjacent pathologic adenopathy along the porta hepatis.  Wall thickening in the ascending colon.  Vascular/Lymphatic: Duodenal mass causes extrinsic compression of the left portal vein and main portal vein proximally. Adenopathy along the porta hepatis including a node measuring 2.2 cm in short axis on image 37 series 2. IVC filter observed, there is thought to be thrombus in the right common iliac vein. Aortoiliac atherosclerotic vascular disease. Scattered mildly enlarged mesenteric lymph nodes are present.  Other: Considerable upper abdominal ascites.  Musculoskeletal: Upper abdominal  ascites.  IMPRESSION: 1. Bilateral lower lobe and right middle lobe pulmonary embolus. 2. Considerable enlargement of the circumferential duodenal mass. Dramatically enlarged right hepatic lobe metastatic lesion. 3. 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 4. There is thrombus visible in the right common iliac vein. The patient has a known IVC filter. 5. Poor definition of the left hepatic vein, significance uncertain. 6. Ascites. Wall thickening in the ascending colon, significance uncertain, possibly due to passive congestion. 7. Contrast medium in the distal esophagus, possibly from dysmotility or reflux. 8. Coronary atherosclerosis. 9. 3.9 by 3.3 cm right adrenal myelolipoma. 10. Adenopathy along the porta hepatis and adjacent mesentery. These results will be called to the ordering clinician or representative by the Radiologist Assistant, and communication documented in the PACS or zVision Dashboard.   Electronically Signed   By: WVan ClinesM.D.   On: 06/05/2015 20:54   Ir Ivc Filter Plmt / S&i /img Guid/mod Sed  06/02/2015   CLINICAL DATA:  Bilateral lower extremity DVT, duodenum carcinoma and GI bleed. The patient requires  placement of an IVC filter for protection from thromboembolic disease.  EXAM: 1. ULTRASOUND GUIDANCE FOR VASCULAR ACCESS OF THE RIGHT COMMON FEMORAL VEIN. 2. IVC VENOGRAM. 3. PERCUTANEOUS IVC FILTER PLACEMENT.  ANESTHESIA/SEDATION: 1.0 mg IV Versed; 25 mcg IV Fentanyl.  Total Moderate Sedation Time  14mnutes.  CONTRAST:  122mOMNIPAQUE IOHEXOL 300 MG/ML SOLN, 2515mMNIPAQUE IOHEXOL 300 MG/ML SOLN  FLUOROSCOPY TIME:  1 minutes and 18 seconds.  PROCEDURE: The procedure, risks, benefits, and alternatives were explained to the patient. Questions regarding the procedure were encouraged and answered. The patient understands and consents to the procedure.  A time-out was performed prior to the procedure. The right groin was  prepped with Betadine in a sterile fashion, and a sterile drape was applied covering the operative field. A sterile gown and sterile gloves were used for the procedure. Local anesthesia was provided with 1% Lidocaine.  Patency of the right common femoral vein was confirmed by ultrasound. Under direct ultrasound guidance, a 21 gauge needle was advanced into the right common femoral vein with ultrasound image documentation performed. After securing access with a micropuncture dilator, a guidewire was advanced into the inferior vena cava. A deployment sheath was advanced over the guidewire. This was utilized to perform IVC venography.  The deployment sheath was further positioned in an appropriate location for filter deployment. A Bard Denali IVC filter was then advanced in the sheath. This was then fully deployed in the infrarenal IVC. Final filter position was confirmed with a fluoroscopic spot image. Contrast injection was also performed through the sheath under fluoroscopy to confirm patency of the IVC at the level of the filter. After the procedure the sheath was removed and hemostasis obtained with manual compression.  COMPLICATIONS: None.  FINDINGS: IVC venography demonstrates a normal caliber IVC with no evidence of thrombus. Renal veins are identified bilaterally. The IVC filter was successfully positioned below the level of the renal veins and is appropriately oriented. This IVC filter has both permanent and retrievable indications.  IMPRESSION: Placement of percutaneous IVC filter in infrarenal IVC. IVC venogram shows no evidence of IVC thrombus and normal caliber of the inferior vena cava. This filter does have both permanent and retrievable indications. Given the patient's history of metastatic malignancy and poor performance status, the filter will be assumed to be a permanent filter.   Electronically Signed   By: GleAletta EdouardD.   On: 06/02/2015 17:14   Us [place] Limited Ruq  06/05/2015    CLINICAL DATA:  History of duodenum carcinoma. Elevated alkaline phosphatase and total bilirubin.  EXAM: US [place] LIMITED - RIGHT UPPER QUADRANT  COMPARISON:  CT abdomen and pelvis 03/01/2015. PET CT scan 03/29/2015.  FINDINGS: Gallbladder:  Not visualized.  Common bile duct:  Diameter: 0.8 cm.  Liver:  The left lobe of the liver is not seen due to overlying bowel gas. Moderate appearing intrahepatic biliary ductal dilatation is seen in the right lobe. A mass lesion in the right lobe measures 8.7 x 8.6 x 8.3 cm and likely represents increase in the size of lesions seen on the prior CT abdomen and pelvis which had measured 2.5 cm. A small volume of perihepatic ascites is new since the prior examinations. No flow is demonstrated within the portal vein consistent with portal vein thrombosis as seen on prior examinations. A large heterogeneous mass lesion anterior to the right hepatic lobe measures 9.6 x 9.2 x 9.0 cm and likely represents increase in the size the patient's known duodenal carcinoma which measured  5.8 cm transverse on the prior PET CT scan.  IMPRESSION: This study is positive for moderate appearing intrahepatic biliary ductal dilatation and common bile duct dilatation at 0.8 cm.  Mass lesion anterior to the right hepatic lobe likely represents the patient's duodenal carcinoma which has increased in size.  Increase in the size of an intrahepatic mass lesion measuring 8.7 cm in diameter.  New moderate volume of perihepatic ascites.   Electronically Signed   By: Inge Rise M.D.   On: 06/05/2015 15:18    Labs:  CBC:  Recent Labs  06/03/15 2121 06/04/15 0510 06/05/15 0345 06/06/15 0350  WBC 9.0 8.8 10.0 7.5  HGB 9.6* 9.6* 9.8* 9.3*  HCT 29.5* 29.3* 30.1* 28.9*  PLT 98* 93* 111* 119*    COAGS:  Recent Labs  01/26/15 0708  03/08/15 0510 05/16/15 0510 06/02/15 0500 06/06/15 0350  INR 3.30*  < > 1.29 1.93* 1.35 1.40  APTT 63*  --  39* 46* 26  --   < > = values in this interval  not displayed.  BMP:  Recent Labs  06/02/15 0500 06/03/15 0512 06/04/15 0510 06/06/15 0350  NA 133* 135 134* 129*  K 4.1 4.0 4.2 4.1  CL 101 101 99* 99*  CO2 23 25 24  20*  GLUCOSE 141* 107* 98 113*  BUN 26* 25* 24* 20  CALCIUM 8.0* 7.9* 8.1* 8.0*  CREATININE 0.86 0.77 0.69 0.51*  GFRNONAA >60 >60 >60 >60  GFRAA >60 >60 >60 >60    LIVER FUNCTION TESTS:  Recent Labs  06/01/15 1755 06/02/15 0500 06/05/15 0959 06/06/15 0350  BILITOT 1.9* 2.9* 11.2* 12.0*  AST 61* 45* 86* 87*  ALT 70* 69* 111* 118*  ALKPHOS 380* 376* 929* 981*  PROT 5.2* 5.1* 4.9* 5.0*  ALBUMIN 2.5* 2.4* 2.2* 2.2*    TUMOR MARKERS:  Recent Labs  03/01/15 2219 04/11/15 1249  CEA 1.4 1.2  CA199 2  --     Assessment and Plan: Advanced metastatic Duodenal adenocarcinoma  Hepatic metastatic disease  GI bleeding secondary to tumor Portal vein thrombosis Obstructive jaundice, T. Bili 12 Ascites Hepatic encephalopathy, Ammonia 170 on Lactulose   DVT s/p IVC filter PE unable to be anticoagulated Request for IR consult to discuss possible perc biliary drain placement  Risks and Benefits discussed with the patient and his family today including, technical difficulty secondary to hepatic tumor burden, bleeding, increased risk of infection/peritonitis with ascites which may lead to sepsis or even death, damage to adjacent structures, need for lifelong external drain and pain secondary to that. All questions were answered. Given the rapid decline in patient's status with the increased risk of the procedure with unclear benefit post procedure, IR will hold off on the percutaneous biliary catheter placement. Family agrees with the above plan Plans per Oncology/GI/TRH   Thank you for this interesting consult.  I greatly enjoyed meeting [employer] and look forward to participating in their care.  SignedHedy Jacob 06/06/2015, 12:35 PM   I spent a total of 20 Minutes in face to face in clinical  consultation, greater than 50% of which was counseling/coordinating care for biliary obstruction.

## 2015-06-06 NOTE — Progress Notes (Signed)
Resumed care of patient. Agree with assessment. Will continue to monitor.

## 2015-06-06 NOTE — Progress Notes (Signed)
IP PROGRESS NOTE  Subjective:   He remains lethargic, but is more alert than yesterday. No complaint.  Objective: Vital signs in last 24 hours: Blood pressure 129/61, pulse 66, temperature 97.5 F (36.4 C), temperature source Oral, resp. rate 20, height 6' (1.829 m), weight 206 lb 2.1 oz (93.5 kg), SpO2 99 %.  Intake/Output from previous day: 06/13 0701 - 06/14 0700 In: 1440 [P.O.:240; I.V.:1200] Out: 400 [Urine:400]  Physical Exam:  HEENT: Scleral icterus  Abdomen: Distended  Extremities: Trace edema at the left ankle Skin: Jaundiced, ecchymosis at the right upper abdomen Neurologic: Lethargic, arousable, follows simple commands, converses  Portacath/PICC-without erythema  Lab Results:  Recent Labs  06/05/15 0345 06/06/15 0350  WBC 10.0 7.5  HGB 9.8* 9.3*  HCT 30.1* 28.9*  PLT 111* 119*    BMET  Recent Labs  06/04/15 0510 06/06/15 0350  NA 134* 129*  K 4.2 4.1  CL 99* 99*  CO2 24 20*  GLUCOSE 98 113*  BUN 24* 20  CREATININE 0.69 0.51*  CALCIUM 8.1* 8.0*    Studies/Results: Ct Abdomen W Contrast  06/05/2015   CLINICAL DATA:  Biliary obstruction.  EXAM: CT ABDOMEN WITH CONTRAST  TECHNIQUE: Multidetector CT imaging of the abdomen was performed using the standard protocol following bolus administration of intravenous contrast.  CONTRAST:  168mL OMNIPAQUE IOHEXOL 300 MG/ML  SOLN  COMPARISON:  Multiple exams, including 03/29/2015 and 03/01/2015  FINDINGS: Lower chest: Today' s exam was not a CT angiogram of the chest. Nevertheless we demonstrate a large amount of clot in the left lower lobe pulmonary arterial tree, inclined also in the right middle lobe and right lower lobe pulmonary arterial tree. Coronary artery atherosclerosis is present. contrast medium is present in the esophagus. Thoracic aortic atherosclerotic calcification is observed. The patient does have a known IVC filter.  Hepatobiliary: Moderate intrahepatic biliary dilatation diffusely due to likely  obstruction of the common bile duct by the large duodenal mass. New metastatic lesion in segment 6 of the liver, 15.7 by 8.6 cm. Gallbladder poorly seen. Poor definition of the left hepatic vein. 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  Pancreas: Unremarkable  Spleen: Unremarkable  Adrenals/Urinary Tract: 3.9 by 3.3 cm right adrenal myelolipoma.  Stomach/Bowel: The descending duodenum mass is much larger, currently 8.9 by 9.3 cm, formerly 5.8 by 6.9 cm on the prior PET-CT. Adjacent pathologic adenopathy along the porta hepatis.  Wall thickening in the ascending colon.  Vascular/Lymphatic: Duodenal mass causes extrinsic compression of the left portal vein and main portal vein proximally. Adenopathy along the porta hepatis including a node measuring 2.2 cm in short axis on image 37 series 2. IVC filter observed, there is thought to be thrombus in the right common iliac vein. Aortoiliac atherosclerotic vascular disease. Scattered mildly enlarged mesenteric lymph nodes are present.  Other: Considerable upper abdominal ascites.  Musculoskeletal: Upper abdominal ascites.  IMPRESSION: 1. Bilateral lower lobe and right middle lobe pulmonary embolus. 2. Considerable enlargement of the circumferential duodenal mass. Dramatically enlarged right hepatic lobe metastatic lesion. 3. 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 4. There is thrombus visible in the right common iliac vein. The patient has a known IVC filter. 5. Poor definition of the left hepatic vein, significance uncertain. 6. Ascites. Wall thickening in the ascending colon, significance uncertain, possibly due to passive congestion. 7. Contrast medium in the distal esophagus, possibly from dysmotility or reflux. 8. Coronary  atherosclerosis. 9. 3.9 by 3.3 cm right adrenal myelolipoma. 10. Adenopathy along the porta hepatis and adjacent mesentery. These results  will be called to the ordering clinician or representative by the Radiologist Assistant, and communication documented in the PACS or zVision Dashboard.   Electronically Signed   By: Van Clines M.D.   On: 06/05/2015 20:54   US Abdomen Limited Ruq  06/05/2015   CLINICAL DATA:  History of duodenum carcinoma. Elevated alkaline phosphatase and total bilirubin.  EXAM: US ABDOMEN LIMITED - RIGHT UPPER QUADRANT  COMPARISON:  CT abdomen and pelvis 03/01/2015. PET CT scan 03/29/2015.  FINDINGS: Gallbladder:  Not visualized.  Common bile duct:  Diameter: 0.8 cm.  Liver:  The left lobe of the liver is not seen due to overlying bowel gas. Moderate appearing intrahepatic biliary ductal dilatation is seen in the right lobe. A mass lesion in the right lobe measures 8.7 x 8.6 x 8.3 cm and likely represents increase in the size of lesions seen on the prior CT abdomen and pelvis which had measured 2.5 cm. A small volume of perihepatic ascites is new since the prior examinations. No flow is demonstrated within the portal vein consistent with portal vein thrombosis as seen on prior examinations. A large heterogeneous mass lesion anterior to the right hepatic lobe measures 9.6 x 9.2 x 9.0 cm and likely represents increase in the size the patient's known duodenal carcinoma which measured 5.8 cm transverse on the prior PET CT scan.  IMPRESSION: This study is positive for moderate appearing intrahepatic biliary ductal dilatation and common bile duct dilatation at 0.8 cm.  Mass lesion anterior to the right hepatic lobe likely represents the patient's duodenal carcinoma which has increased in size.  Increase in the size of an intrahepatic mass lesion measuring 8.7 cm in diameter.  New moderate volume of perihepatic ascites.   Electronically Signed   By: Inge Rise M.D.   On: 06/05/2015 15:18    Medications: I have reviewed the patient's current medications.  Assessment/Plan: 1. Duodenal mass, abdominal/retroperitoneal  lymphadenopathy, and liver metastases noted on a CT 03/01/2015  Upper endoscopy 03/03/2015 confirmed a duodenal mass, status post a biopsy with the final pathology confirming a carcinoma, immunohistochemical stains negative for lymphoma, neuroendocrine carcinoma, and melanoma markers. Positive staining for CDX2, cytokeratin AE1/AE3, and CD10  PET scan 03/29/2015 showed a large intensely hypermetabolic mass circumferentially narrowing the second portion of the duodenum. Adjacent hypermetabolic lymph nodes medial to the duodenum mass. Irregular hypermetabolic nodal mass in the inferior aspect of the gastrohepatic ligament. Hypermetabolic nodal metastasis in the retroperitoneum left of the aorta. No abnormal metabolic activity within the enlarged inferior right lobe of the liver.  Cycle 1 FOLFOX 04/11/2015  Cycle 2 FOLFOX 04/25/2015  2. Gastric outlet obstruction secondary to #1, status post a palliative gastrojejunostomy 03/09/2015  3. 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 Portal vein thrombosis diagnosed in September 2015. Off of anticoagulation due to GI bleeding.  5. Left leg DVT December 2014. Off of anticoagulation due to GI bleeding.  Bilateral lower extremity DVTs confirmed on a Doppler 06/02/2015, status post IVC filter placement  6. History of coronary artery disease, status post an LAD drug-eluting stent July 2015  7. Remote history of  melanoma of the right ear  8. 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 History of drug related ITP  10. Abdominal pain secondary to the duodenal mass/liver metastases/adenopathy  11.  Neuropathy  12. Altered mental status-likely secondary to lorazepam versus hepatic encephalopathy  13. External hemorrhoid-referred to Dr. Excell Seltzer  14. Admission 05/15/2015 with a fever and altered mental status-potentially related to a systemic infection, one blood culture positive for "gram variable rods "and urine streptococcus pneumoniae antigen positive. Discharged 05/20/2015.  The bilirubin is markedly elevated. A CT 06/05/2015 confirmed progression of the duodenal mass/liver metastases, pulmonary emboli, and ascites. There is intrahepatic and common bile duct dilatation.  I discussed the CT findings with Mr. Alen and his daughter. 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 He has multiple areas of venous thrombosis and pulmonary emboli in the setting of recent tumor related GI bleeding. He is not a candidate for anticoagulation therapy. I do not recommend palliative radiation.  Mr. Huhn has an altered mental status secondary to hepatic encephalopathy.  He has a poor prognosis for survival beyond weeks. I recommend Hospice care. He appears to be a candidate for [facility]. We discussed CPR and ACLS issues. I recommend a no CODE BLUE status, but he remains on a full CODE STATUS at present.  I discussed the case with Dr. Ardis Hughs and Dr. Anselm Pancoast. It would be difficult to relieve the biliary obstruction by the percutaneous route. An attempt is not recommended.   Recommendations: 1. [facility] referral to consider transfer to [facility] 2. Continue discussions regarding CODE STATUS with patient and family  3. Trial of lactulose for the hepatic encephalopathy     LOS: 5 days   Evie Croston  06/06/2015, 10:46 AM

## 2015-06-06 NOTE — Progress Notes (Addendum)
Patient Demographics  James [name], is a 81 y.o. male, DOB - 1928/05/05, DVV:[id number]  Admit date - 06/01/2015   Admitting Physician Allyne Gee, MD  Outpatient Primary MD for the patient is [name], MD  LOS - 5   Chief Complaint  Patient presents with  . Weakness  . 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 He was found to have small to medium esophageal varices and a very large circumferential ulcerated mass in the duodenal bulb. This is not amenable to endoscopic therapy. Presents with melena, found to have hemoglobin of 6.6,unfortunately his venous Doppler is positive for DVT. IVC filter inserted 06/02/15, patient developed jaundice, with significantly elevated LFTs, ET showing significant interval growth in the primary tumor and in metastatic liver disease with intrahepatic biliary dilation, as well evidence of bilateral .PE  Subjective:   James [name] today has, No headache, No chest pain, No abdominal pain - No Nausea, reports generalized weakness , no coffee-ground emesis, had bowel movement this a.m., but no evidence of blood. Assessment & Plan    Principal Problem:   Symptomatic anemia Active Problems:   OBESITY, MORBID   HYPERTENSION, BENIGN   HLD (hyperlipidemia)   Chronic diastolic CHF (congestive heart failure)   GI bleed   Gastrointestinal hemorrhage with melena  Symptomatic anemia secondary to GI bleed : -Transfused 3 units PRBC 06/02/15, and 1 unit on 06/03/15,   goal is to keep hemoglobin > 9 . Hemoglobin is 9.6 today. - Ferritin within normal range, but low TIBC and iron, will discuss with hematology if this indication for IV Feraheme - Please see  further discussion in  palliative care section.  Upper GI bleed  - Patient had slow ongoing chronic GI bleed secondary to metastatic duodenal adenocarcinoma bleed , this is unlikely related to varices bleed , octreotide stopped by GI , no indication for EGD at this point . Tolerating soft diet - Monitor hemoglobin closely and keep > 9  -Please see further discussion and palliative care  Jaundiced/LFT - This is secondary to biliary obstruction from tumor, and significant involvement of liver by metastatic disease, as per GI 1/4-1/3 of liver is too more at this point. - IR consulted regarding biliary drain, please see further discussion and palliative care section  Hepatic encephalopathy/hyperammonemia - Continue with lactulose  Acute DVT /bilateral PE - At this point patient is contraindicated for anticoagulation , IVC filter placed by IR 06/17/93  Chronic diastolic CHF - Last EF 85-46% - Appears compensated, continue with Lasix and Aldactone given multiple transfusions.  Duodenal Adenocarcinoma with metastasis to liver - Oncology consult appreciated   Palliative care discussion - Patient with significant deterioration in medical condition secondary to rapidly aggressive growing duodenal adenocarcinoma, with obstructive jaundice, significant metastatic liver disease, as well has PE/acute DVT, with contraindication for anticoagulation given his presentation with GI bleed, had lengthy discussion with daughter about goals of care, at this point comfort/hospice would be best course, she is considering hospice, palliative care were consulted, and they will discuss CODE  STATUS with her as well.    Code Status:  full  Family Communication:Discussed with daughter at bedside 6/14 despostion Plan: Pending palliative care meeting, daughter mentioned [facility]othe[facility]eman place as a possibility.   Procedures  3units PRBC transfusion 6/10 1 unit PRBC transfusion 6/11   Consults   GI   IR Oncology Radiation oncology Palliative care Medications  Scheduled Meds: . antiseptic oral rinse  7 mL Mouth Rinse q12n4p  . chlorhexidine  15 mL Mouth Rinse BID  . cholecalciferol  2,000 Units Oral Daily  . DULoxetine  30 mg Oral BID  . feeding supplement (ENSURE ENLIVE)  237 mL Oral TID BM  . finasteride  5 mg Oral Daily  . folic acid  1 mg Oral Daily  . furosemide  20 mg Oral Daily  . hydrocortisone-pramoxine  1 applicator Rectal BID  . insulin aspart  0-15 Units Subcutaneous TID WC  . iron polysaccharides  150 mg Oral BID  . lactulose  45 g Oral QID  . levothyroxine  50 mcg Oral QAC breakfast  . multivitamin with minerals  1 tablet Oral Daily  . pantoprazole (PROTONIX) IV  40 mg Intravenous Q12H  . pravastatin  80 mg Oral q1800  . saccharomyces boulardii  250 mg Oral Daily  . sodium chloride  10-40 mL Intracatheter Q12H  . sodium chloride  3 mL Intravenous Q12H  . spironolactone  12.5 mg Oral Daily  . thiamine  100 mg Oral Daily   Continuous Infusions: . sodium chloride 50 mL/hr at 06/05/15 1937   PRN Meds:.benzonatate, dibucaine, nitroGLYCERIN, ondansetron **OR** ondansetron (ZOFRAN) IV, oxyCODONE-acetaminophen, sodium chloride, sodium chloride  DVT Prophylaxis  No chemical anticoagulation due to GI bleed, no SCD due to acute DVT. Has IVC filter.  Lab Results  Component Value Date   PLT 119* 06/06/2015    Antibiotics    Anti-infectives    Start     Dose/Rate Route Frequency Ordered Stop   06/01/15 2100  fluconazole (DIFLUCAN) tablet 100 mg  Status:  Discontinued     100 mg Oral Daily 06/01/15 2050 06/01/15 2108          Objective:   Filed Vitals:   06/05/15 0459 06/05/15 1347 06/05/15 2119 06/06/15 0635  BP: 126/66 134/69 118/63 129/61  Pulse: 68 74 68 66  Temp: 97.6 F (36.4 C) 97.6 F (36.4 C) 97.7 F (36.5 C) 97.5 F (36.4 C)  TempSrc: Axillary Oral Oral Oral  Resp: 18 18 18 20   Height:      Weight:    93.5 kg (206 lb 2.1 oz)  SpO2: 98%  100% 99% 99%    Wt Readings from Last 3 Encounters:  06/06/15 93.5 kg (206 lb 2.1 oz)  05/29/15 92.534 kg (204 lb)  05/25/15 92.534 kg (204 lb)     Intake/Output Summary (Last 24 hours) at 06/06/15 1231 Last data filed at 06/06/15 0700  Gross per 24 hour  Intake   1440 ml  Output    400 ml  Net   1040 ml     Physical Exam  Awake Alert, Oriented , sleepy, jaundiced, No new F.N deficits, Normal affect [place].AT,PERRAL Supple Neck,No JVD, No cervical lymphadenopathy appriciated.  Symmetrical Chest wall movement, Good air movement bilaterally,  RRR,No Gallops,Rubs or new Murmurs, No Parasternal Heave +ve B.Sounds, Abd Soft, minimal tenderness in epigastric area, No organomegaly appriciated, No rebound - guarding or rigidity. No Cyanosis, B/L  edema, No new Rash or bruise    Data Review   Micro  Results No results found for this or any previous visit (from the past 240 hour(s)).  Radiology Reports Dg Chest 2 View  05/16/2015   CLINICAL DATA:  Shortness of breath with fever and chills. Metastatic duodenum cancer  EXAM: CHEST  2 VIEW  COMPARISON:  May 15, 2015  FINDINGS: Port-A-Cath tip is in the superior vena cava. No pneumothorax. There is no edema or consolidation. There is slight bibasilar lung scarring. Heart is upper limits normal in size with pulmonary vascularity within normal limits. No adenopathy. No bone lesions.  IMPRESSION: Slight bibasilar lung scarring.  No edema or consolidation.   Electronically Signed   By: Lowella Grip III M.D.   On: 05/16/2015 11:46   Dg Chest 2 View  05/15/2015   CLINICAL DATA:  Pt from home, complains of emesis and dizziness, denies weakness. Hx colon cancer.  EXAM: CHEST  2 VIEW  COMPARISON:  Cough 04/12/2015  FINDINGS: Cardiac silhouette mildly enlarged. No mediastinal or hilar masses or evidence of adenopathy.  Minor reticular scarring in the antral lateral lung bases. This is unchanged. No lung consolidation or edema. No pleural effusion or  pneumothorax.  Right anterior chest wall Port-A-Cath is stable.  Bony thorax is intact.  IMPRESSION: No acute cardiopulmonary disease.   Electronically Signed   By: Lajean Manes M.D.   On: 05/15/2015 17:08   Ct Abdomen W Contrast  06/05/2015   CLINICAL DATA:  Biliary obstruction.  EXAM: CT ABDOMEN WITH CONTRAST  TECHNIQUE: Multidetector CT imaging of the abdomen was performed using the standard protocol following bolus administration of intravenous contrast.  CONTRAST:  142mL OMNIPAQUE IOHEXOL 300 MG/ML  SOLN  COMPARISON:  Multiple exams, including 03/29/2015 and 03/01/2015  FINDINGS: Lower chest: Today' s exam was not a CT angiogram of the chest. Nevertheless we demonstrate a large amount of clot in the left lower lobe pulmonary arterial tree, inclined also in the right middle lobe and right lower lobe pulmonary arterial tree. Coronary artery atherosclerosis is present. contrast medium is present in the esophagus. Thoracic aortic atherosclerotic calcification is observed. The patient does have a known IVC filter.  Hepatobiliary: Moderate intrahepatic biliary dilatation diffusely due to likely obstruction of the common bile duct by the large duodenal mass. New metastatic lesion in segment 6 of the liver, 15.7 by 8.6 cm. Gallbladder poorly seen. Poor definition of the left hepatic vein. 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  Pancreas: Unremarkable  Spleen: Unremarkable  Adrenals/Urinary Tract: 3.9 by 3.3 cm right adrenal myelolipoma.  Stomach/Bowel: The descending duodenum mass is much larger, currently 8.9 by 9.3 cm, formerly 5.8 by 6.9 cm on the prior PET-CT. Adjacent pathologic adenopathy along the porta hepatis.  Wall thickening in the ascending colon.  Vascular/Lymphatic: Duodenal mass causes extrinsic compression of the left portal vein and main portal vein proximally. Adenopathy along the porta hepatis including a node measuring 2.2 cm in short axis on  image 37 series 2. IVC filter observed, there is thought to be thrombus in the right common iliac vein. Aortoiliac atherosclerotic vascular disease. Scattered mildly enlarged mesenteric lymph nodes are present.  Other: Considerable upper abdominal ascites.  Musculoskeletal: Upper abdominal ascites.  IMPRESSION: 1. Bilateral lower lobe and right middle lobe pulmonary embolus. 2. Considerable enlargement of the circumferential duodenal mass. Dramatically enlarged right hepatic lobe metastatic lesion. 3. 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 4. There is thrombus visible in the  right common iliac vein. The patient has a known IVC filter. 5. Poor definition of the left hepatic vein, significance uncertain. 6. Ascites. Wall thickening in the ascending colon, significance uncertain, possibly due to passive congestion. 7. Contrast medium in the distal esophagus, possibly from dysmotility or reflux. 8. Coronary atherosclerosis. 9. 3.9 by 3.3 cm right adrenal myelolipoma. 10. Adenopathy along the porta hepatis and adjacent mesentery. These results will be called to the ordering clinician or representative by the Radiologist Assistant, and communication documented in the PACS or zVision Dashboard.   Electronically Signed   By: Van Clines M.D.   On: 06/05/2015 20:54   Ir Ivc Filter Plmt / S&i /img Guid/mod Sed  06/02/2015   CLINICAL DATA:  Bilateral lower extremity DVT, duodenum carcinoma and GI bleed. The patient requires placement of an IVC filter for protection from thromboembolic disease.  EXAM: 1. ULTRASOUND GUIDANCE FOR VASCULAR ACCESS OF THE RIGHT COMMON FEMORAL VEIN. 2. IVC VENOGRAM. 3. PERCUTANEOUS IVC FILTER PLACEMENT.  ANESTHESIA/SEDATION: 1.0 mg IV Versed; 25 mcg IV Fentanyl.  Total Moderate Sedation Time  33minutes.  CONTRAST:  47mL OMNIPAQUE IOHEXOL 300 MG/ML SOLN, 86mL OMNIPAQUE IOHEXOL 300 MG/ML SOLN  FLUOROSCOPY TIME:  1 minutes and 18  seconds.  PROCEDURE: The procedure, risks, benefits, and alternatives were explained to the patient. Questions regarding the procedure were encouraged and answered. The patient understands and consents to the procedure.  A time-out was performed prior to the procedure. The right groin was prepped with Betadine in a sterile fashion, and a sterile drape was applied covering the operative field. A sterile gown and sterile gloves were used for the procedure. Local anesthesia was provided with 1% Lidocaine.  Patency of the right common femoral vein was confirmed by ultrasound. Under direct ultrasound guidance, a 21 gauge needle was advanced into the right common femoral vein with ultrasound image documentation performed. After securing access with a micropuncture dilator, a guidewire was advanced into the inferior vena cava. A deployment sheath was advanced over the guidewire. This was utilized to perform IVC venography.  The deployment sheath was further positioned in an appropriate location for filter deployment. A Bard Denali IVC filter was then advanced in the sheath. This was then fully deployed in the infrarenal IVC. Final filter position was confirmed with a fluoroscopic spot image. Contrast injection was also performed through the sheath under fluoroscopy to confirm patency of the IVC at the level of the filter. After the procedure the sheath was removed and hemostasis obtained with manual compression.  COMPLICATIONS: None.  FINDINGS: IVC venography demonstrates a normal caliber IVC with no evidence of thrombus. Renal veins are identified bilaterally. The IVC filter was successfully positioned below the level of the renal veins and is appropriately oriented. This IVC filter has both permanent and retrievable indications.  IMPRESSION: Placement of percutaneous IVC filter in infrarenal IVC. IVC venogram shows no evidence of IVC thrombus and normal caliber of the inferior vena cava. This filter does have both  permanent and retrievable indications. Given the patient's history of metastatic malignancy and poor performance status, the filter will be assumed to be a permanent filter.   Electronically Signed   By: Aletta Edouard M.D.   On: 06/02/2015 17:14   US Abdomen Limited Ruq  06/05/2015   CLINICAL DATA:  History of duodenum carcinoma. Elevated alkaline phosphatase and total bilirubin.  EXAM: US ABDOMEN LIMITED - RIGHT UPPER QUADRANT  COMPARISON:  CT abdomen and pelvis 03/01/2015. PET CT scan 03/29/2015.  FINDINGS: Gallbladder:  Not visualized.  Common bile duct:  Diameter: 0.8 cm.  Liver:  The left lobe of the liver is not seen due to overlying bowel gas. Moderate appearing intrahepatic biliary ductal dilatation is seen in the right lobe. A mass lesion in the right lobe measures 8.7 x 8.6 x 8.3 cm and likely represents increase in the size of lesions seen on the prior CT abdomen and pelvis which had measured 2.5 cm. A small volume of perihepatic ascites is new since the prior examinations. No flow is demonstrated within the portal vein consistent with portal vein thrombosis as seen on prior examinations. A large heterogeneous mass lesion anterior to the right hepatic lobe measures 9.6 x 9.2 x 9.0 cm and likely represents increase in the size the patient's known duodenal carcinoma which measured 5.8 cm transverse on the prior PET CT scan.  IMPRESSION: This study is positive for moderate appearing intrahepatic biliary ductal dilatation and common bile duct dilatation at 0.8 cm.  Mass lesion anterior to the right hepatic lobe likely represents the patient's duodenal carcinoma which has increased in size.  Increase in the size of an intrahepatic mass lesion measuring 8.7 cm in diameter.  New moderate volume of perihepatic ascites.   Electronically Signed   By: Inge Rise M.D.   On: 06/05/2015 15:18     CBC  Recent Labs Lab 06/03/15 0512 06/03/15 2121 06/04/15 0510 06/05/15 0345 06/06/15 0350  WBC 8.8  9.0 8.8 10.0 7.5  HGB 8.3* 9.6* 9.6* 9.8* 9.3*  HCT 25.6* 29.5* 29.3* 30.1* 28.9*  PLT 97* 98* 93* 111* 119*  MCV 93.1 91.9 91.8 91.5 91.5  MCH 30.2 29.9 30.1 29.8 29.4  MCHC 32.4 32.5 32.8 32.6 32.2  RDW 20.8* 20.5* 20.6* 20.1* 20.0*    Chemistries   Recent Labs Lab 06/01/15 1755 06/02/15 0500 06/03/15 0512 06/04/15 0510 06/05/15 0959 06/06/15 0350  NA 132* 133* 135 134*  --  129*  K 4.8 4.1 4.0 4.2  --  4.1  CL 102 101 101 99*  --  99*  CO2 20* 23 25 24   --  20*  GLUCOSE 155* 141* 107* 98  --  113*  BUN 29* 26* 25* 24*  --  20  CREATININE 0.89 0.86 0.77 0.69  --  0.51*  CALCIUM 8.0* 8.0* 7.9* 8.1*  --  8.0*  AST 61* 45*  --   --  86* 87*  ALT 70* 69*  --   --  111* 118*  ALKPHOS 380* 376*  --   --  929* 981*  BILITOT 1.9* 2.9*  --   --  11.2* 12.0*   ------------------------------------------------------------------------------------------------------------------ estimated creatinine clearance is 81.7 mL/min (by C-G formula based on Cr of 0.51). ------------------------------------------------------------------------------------------------------------------ No results for input(s): HGBA1C in the last 72 hours. ------------------------------------------------------------------------------------------------------------------ No results for input(s): CHOL, HDL, LDLCALC, TRIG, CHOLHDL, LDLDIRECT in the last 72 hours. ------------------------------------------------------------------------------------------------------------------ No results for input(s): TSH, T4TOTAL, T3FREE, THYROIDAB in the last 72 hours.  Invalid input(s): FREET3 ------------------------------------------------------------------------------------------------------------------  Recent Labs  06/04/15 1030  FERRITIN 103  TIBC 202*  IRON 11*    Coagulation profile  Recent Labs Lab 06/02/15 0500 06/06/15 0350  INR 1.35 1.40    No results for input(s): DDIMER in the last 72 hours.  Cardiac  Enzymes No results for input(s): CKMB, TROPONINI, MYOGLOBIN in the last 168 hours.  Invalid input(s): CK ------------------------------------------------------------------------------------------------------------------ Invalid input(s): POCBNP     Time Spent in minutes   30 minutes   Kinser Fellman M.D on  06/06/2015 at 12:31 PM  Between 7am to 7pm - Pager - [phone]  After 7pm go to [email or web address] - password [facility]  [facility]   Office  [phone]

## 2015-06-06 NOTE — Progress Notes (Signed)
Patient's family will only be available after 3.30pm on Wednesday June 15th. [name] Furukawa can be reached at [phone]

## 2015-06-06 NOTE — Progress Notes (Signed)
[facility] Progress Note    Since last GI note: [place] and CT scan yesterday.  I discussed the findings with his daughter and him in room this morning.  Objective: Vital signs in last 24 hours: Temp:  [97.5 F (36.4 C)-97.7 F (36.5 C)] 97.5 F (36.4 C) (06/14 0635) Pulse Rate:  [66-74] 66 (06/14 0635) Resp:  [18-20] 20 (06/14 0635) BP: (118-134)/(61-69) 129/61 mmHg (06/14 0635) SpO2:  [99 %-100 %] 99 % (06/14 0635) Weight:  [206 lb 2.1 oz (93.5 kg)] 206 lb 2.1 oz (93.5 kg) (06/14 0635) Last BM Date: 06/05/15 General: alert and oriented times 3, sleepy; + scleral icterus Heart: regular rate and rythm Abdomen: soft, non-tender, non-distended, normal bowel sounds   Lab Results:  Recent Labs  06/04/15 0510 06/05/15 0345 06/06/15 0350  WBC 8.8 10.0 7.5  HGB 9.6* 9.8* 9.3*  PLT 93* 111* 119*  MCV 91.8 91.5 91.5    Recent Labs  06/04/15 0510 06/06/15 0350  NA 134* 129*  K 4.2 4.1  CL 99* 99*  CO2 24 20*  GLUCOSE 98 113*  BUN 24* 20  CREATININE 0.69 0.51*  CALCIUM 8.1* 8.0*    Recent Labs  06/05/15 0959 06/06/15 0350  PROT 4.9* 5.0*  ALBUMIN 2.2* 2.2*  AST 86* 87*  ALT 111* 118*  ALKPHOS 929* 981*  BILITOT 11.2* 12.0*  BILIDIR 7.2* 7.9*  IBILI 4.0* 4.1*    Recent Labs  06/06/15 0350  INR 1.40     Studies/Results: Ct Abdomen W Contrast  06/05/2015   CLINICAL DATA:  Biliary obstruction.  EXAM: CT ABDOMEN WITH CONTRAST  TECHNIQUE: Multidetector CT imaging of the abdomen was performed using the standard protocol following bolus administration of intravenous contrast.  CONTRAST:  168m OMNIPAQUE IOHEXOL 300 MG/ML  SOLN  COMPARISON:  Multiple exams, including 03/29/2015 and 03/01/2015  FINDINGS: Lower chest: Today' s exam was not a CT angiogram of the chest. Nevertheless we demonstrate a large amount of clot in the left lower lobe pulmonary arterial tree, inclined also in the right middle lobe and right lower lobe pulmonary arterial tree.  Coronary artery atherosclerosis is present. contrast medium is present in the esophagus. Thoracic aortic atherosclerotic calcification is observed. The patient does have a known IVC filter.  Hepatobiliary: Moderate intrahepatic biliary dilatation diffusely due to likely obstruction of the common bile duct by the large duodenal mass. New metastatic lesion in segment 6 of the liver, 15.7 by 8.6 cm. Gallbladder poorly seen. Poor definition of the left hepatic vein. 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  Pancreas: Unremarkable  Spleen: Unremarkable  Adrenals/Urinary Tract: 3.9 by 3.3 cm right adrenal myelolipoma.  Stomach/Bowel: The descending duodenum mass is much larger, currently 8.9 by 9.3 cm, formerly 5.8 by 6.9 cm on the prior PET-CT. Adjacent pathologic adenopathy along the porta hepatis.  Wall thickening in the ascending colon.  Vascular/Lymphatic: Duodenal mass causes extrinsic compression of the left portal vein and main portal vein proximally. Adenopathy along the porta hepatis including a node measuring 2.2 cm in short axis on image 37 series 2. IVC filter observed, there is thought to be thrombus in the right common iliac vein. Aortoiliac atherosclerotic vascular disease. Scattered mildly enlarged mesenteric lymph nodes are present.  Other: Considerable upper abdominal ascites.  Musculoskeletal: Upper abdominal ascites.  IMPRESSION: 1. Bilateral lower lobe and right middle lobe pulmonary embolus. 2. Considerable enlargement of the circumferential duodenal mass. Dramatically enlarged right hepatic lobe metastatic lesion. 3.  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 4. There is thrombus visible in the right common iliac vein. The patient has a known IVC filter. 5. Poor definition of the left hepatic vein, significance uncertain. 6. Ascites. Wall thickening in the ascending colon, significance uncertain,  possibly due to passive congestion. 7. Contrast medium in the distal esophagus, possibly from dysmotility or reflux. 8. Coronary atherosclerosis. 9. 3.9 by 3.3 cm right adrenal myelolipoma. 10. Adenopathy along the porta hepatis and adjacent mesentery. These results will be called to the ordering clinician or representative by the Radiologist Assistant, and communication documented in the PACS or zVision Dashboard.   Electronically Signed   By: Van Clines M.D.   On: 06/05/2015 20:54   US Abdomen Limited Ruq  06/05/2015   CLINICAL DATA:  History of duodenum carcinoma. Elevated alkaline phosphatase and total bilirubin.  EXAM: US ABDOMEN LIMITED - RIGHT UPPER QUADRANT  COMPARISON:  CT abdomen and pelvis 03/01/2015. PET CT scan 03/29/2015.  FINDINGS: Gallbladder:  Not visualized.  Common bile duct:  Diameter: 0.8 cm.  Liver:  The left lobe of the liver is not seen due to overlying bowel gas. Moderate appearing intrahepatic biliary ductal dilatation is seen in the right lobe. A mass lesion in the right lobe measures 8.7 x 8.6 x 8.3 cm and likely represents increase in the size of lesions seen on the prior CT abdomen and pelvis which had measured 2.5 cm. A small volume of perihepatic ascites is new since the prior examinations. No flow is demonstrated within the portal vein consistent with portal vein thrombosis as seen on prior examinations. A large heterogeneous mass lesion anterior to the right hepatic lobe measures 9.6 x 9.2 x 9.0 cm and likely represents increase in the size the patient's known duodenal carcinoma which measured 5.8 cm transverse on the prior PET CT scan.  IMPRESSION: This study is positive for moderate appearing intrahepatic biliary ductal dilatation and common bile duct dilatation at 0.8 cm.  Mass lesion anterior to the right hepatic lobe likely represents the patient's duodenal carcinoma which has increased in size.  Increase in the size of an intrahepatic mass lesion measuring 8.7 cm in  diameter.  New moderate volume of perihepatic ascites.   Electronically Signed   By: Inge Rise M.D.   On: 06/05/2015 15:18     Medications: Scheduled Meds: . antiseptic oral rinse  7 mL Mouth Rinse q12n4p  . chlorhexidine  15 mL Mouth Rinse BID  . cholecalciferol  2,000 Units Oral Daily  . DULoxetine  30 mg Oral BID  . feeding supplement (ENSURE ENLIVE)  237 mL Oral TID BM  . finasteride  5 mg Oral Daily  . folic acid  1 mg Oral Daily  . furosemide  20 mg Oral Daily  . hydrocortisone-pramoxine  1 applicator Rectal BID  . insulin aspart  0-15 Units Subcutaneous TID WC  . iron polysaccharides  150 mg Oral BID  . lactulose  45 g Oral QID  . levothyroxine  50 mcg Oral QAC breakfast  . multivitamin with minerals  1 tablet Oral Daily  . pantoprazole (PROTONIX) IV  40 mg Intravenous Q12H  . polyethylene glycol  17 g Oral BID  . pravastatin  80 mg Oral q1800  . saccharomyces boulardii  250 mg Oral Daily  . sodium chloride  10-40 mL Intracatheter Q12H  . sodium chloride  3 mL Intravenous Q12H  . spironolactone  12.5 mg Oral  Daily  . thiamine  100 mg Oral Daily   Continuous Infusions: . sodium chloride 50 mL/hr at 06/05/15 1937   PRN Meds:.benzonatate, dibucaine, nitroGLYCERIN, ondansetron **OR** ondansetron (ZOFRAN) IV, oxyCODONE-acetaminophen, sodium chloride, sodium chloride    Assessment/Plan: 81 y.o. male with locally advanced and metastatic duodenal cancer, GI bleeding (from tumor) and also DVTs  The CT showed significant interval growth in primary tumor and in metastatic liver lesion. I estimate 1/4 to 1/3 of the liver is now tumor.  This can certainly contribute to his jaundice. There is intrahepatic biliary dilation and I suspect he has biliary obstruction near level of CBD/CHD.  ERCP is not feasible given duodenal obstruction from tumor.  PTC (if technically possible) is an option however I recommended against it, I am just not sure that it will offer improvement in his  quality of life that remains.  I recommended they discuss comfort measures with palliative care.     Milus Banister, MD  06/06/2015, 7:51 AM [facility] Pager [phone]

## 2015-06-06 NOTE — Progress Notes (Signed)
Lawernce Pitts (daughter)  wants to discuss with MD why there was no discussion with patient or patient's daughter or patient's cardiologist before patient was taken off the following medications: Synthroid,Lasix,Aldactone and Pravastatin because the patient has CHF? Please call Lawernce Pitts [phone] Thank you

## 2015-06-07 DIAGNOSIS — C801 Malignant (primary) neoplasm, unspecified: Secondary | ICD-10-CM

## 2015-06-07 LAB — GLUCOSE, CAPILLARY: GLUCOSE-CAPILLARY: 122 mg/dL — AB (ref 65–99)

## 2015-06-07 MED ORDER — PANTOPRAZOLE SODIUM 40 MG PO TBEC
40.0000 mg | DELAYED_RELEASE_TABLET | Freq: Two times a day (BID) | ORAL | Status: DC
Start: 2015-06-07 — End: 2015-06-08
  Administered 2015-06-07 – 2015-06-08 (×2): 40 mg via ORAL
  Filled 2015-06-07 (×3): qty 1

## 2015-06-07 MED ORDER — LEVOTHYROXINE SODIUM 50 MCG PO TABS
50.0000 ug | ORAL_TABLET | Freq: Every day | ORAL | Status: DC
  Administered 2015-06-07 – 2015-06-08 (×2): 50 ug via ORAL
  Filled 2015-06-07 (×2): qty 1

## 2015-06-07 MED ORDER — LACTULOSE 10 GM/15ML PO SOLN
30.0000 g | Freq: Four times a day (QID) | ORAL | Status: DC
  Filled 2015-06-07 (×2): qty 45

## 2015-06-07 MED ORDER — LACTULOSE 10 GM/15ML PO SOLN
30.0000 g | Freq: Every day | ORAL | Status: DC
  Administered 2015-06-07 – 2015-06-08 (×2): 30 g via ORAL
  Filled 2015-06-07 (×2): qty 45

## 2015-06-07 MED ORDER — FUROSEMIDE 20 MG PO TABS
20.0000 mg | ORAL_TABLET | Freq: Every day | ORAL | Status: DC
  Administered 2015-06-07 – 2015-06-08 (×2): 20 mg via ORAL
  Filled 2015-06-07 (×2): qty 1

## 2015-06-07 MED ORDER — SPIRONOLACTONE 12.5 MG HALF TABLET
12.5000 mg | ORAL_TABLET | Freq: Every day | ORAL | Status: DC
  Administered 2015-06-07 – 2015-06-08 (×2): 12.5 mg via ORAL
  Filled 2015-06-07 (×2): qty 1

## 2015-06-07 NOTE — Progress Notes (Signed)
PT Cancellation Note  Patient Details Name: SHUAIB CORSINO MRN: [id number] DOB: 1928-05-29   Cancelled Treatment:    Reason Eval/Treat Not Completed: Attempted PT tx session. Spoke with RN who stated now was not a good time to get pt up. Will check back if schedule permits.  Otherwise, will have to see another day. Thanks.    Weston Anna, MPT Pager: [phone]

## 2015-06-07 NOTE — Progress Notes (Signed)
IP PROGRESS NOTE  Subjective:   He is alert. He had multiple bowel movements last night. His family is concerned that several of his medications were discontinued.  Objective: Vital signs in last 24 hours: Blood pressure 129/61, pulse 83, temperature 98 F (36.7 C), temperature source Oral, resp. rate 20, height 6' (1.829 m), weight 203 lb 7.8 oz (92.3 kg), SpO2 99 %.  Intake/Output from previous day: 06/14 0701 - 06/15 0700 In: 1200 [P.O.:600; I.V.:600] Out: 550 [Urine:550]  Physical Exam:  HEENT: Scleral icterus Lungs: Clear anteriorly Cardiac: Regular rate and rhythm Abdomen: Distended  Extremities: Trace edema at the left ankle Skin: Jaundiced, ecchymosis at the right upper abdomen Neurologic: Alert, follows commands  Portacath/PICC-without erythema  Lab Results:  Recent Labs  06/05/15 0345 06/06/15 0350  WBC 10.0 7.5  HGB 9.8* 9.3*  HCT 30.1* 28.9*  PLT 111* 119*    BMET  Recent Labs  06/06/15 0350  NA 129*  K 4.1  CL 99*  CO2 20*  GLUCOSE 113*  BUN 20  CREATININE 0.51*  CALCIUM 8.0*    Studies/Results: Ct Abdomen W Contrast  06/05/2015   CLINICAL DATA:  Biliary obstruction.  EXAM: CT ABDOMEN WITH CONTRAST  TECHNIQUE: Multidetector CT imaging of the abdomen was performed using the standard protocol following bolus administration of intravenous contrast.  CONTRAST:  165mL OMNIPAQUE IOHEXOL 300 MG/ML  SOLN  COMPARISON:  Multiple exams, including 03/29/2015 and 03/01/2015  FINDINGS: Lower chest: Today' s exam was not a CT angiogram of the chest. Nevertheless we demonstrate a large amount of clot in the left lower lobe pulmonary arterial tree, inclined also in the right middle lobe and right lower lobe pulmonary arterial tree. Coronary artery atherosclerosis is present. contrast medium is present in the esophagus. Thoracic aortic atherosclerotic calcification is observed. The patient does have a known IVC filter.  Hepatobiliary: Moderate intrahepatic biliary  dilatation diffusely due to likely obstruction of the common bile duct by the large duodenal mass. New metastatic lesion in segment 6 of the liver, 15.7 by 8.6 cm. Gallbladder poorly seen. Poor definition of the left hepatic vein. 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  Pancreas: Unremarkable  Spleen: Unremarkable  Adrenals/Urinary Tract: 3.9 by 3.3 cm right adrenal myelolipoma.  Stomach/Bowel: The descending duodenum mass is much larger, currently 8.9 by 9.3 cm, formerly 5.8 by 6.9 cm on the prior PET-CT. Adjacent pathologic adenopathy along the porta hepatis.  Wall thickening in the ascending colon.  Vascular/Lymphatic: Duodenal mass causes extrinsic compression of the left portal vein and main portal vein proximally. Adenopathy along the porta hepatis including a node measuring 2.2 cm in short axis on image 37 series 2. IVC filter observed, there is thought to be thrombus in the right common iliac vein. Aortoiliac atherosclerotic vascular disease. Scattered mildly enlarged mesenteric lymph nodes are present.  Other: Considerable upper abdominal ascites.  Musculoskeletal: Upper abdominal ascites.  IMPRESSION: 1. Bilateral lower lobe and right middle lobe pulmonary embolus. 2. Considerable enlargement of the circumferential duodenal mass. Dramatically enlarged right hepatic lobe metastatic lesion. 3. 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 4. There is thrombus visible in the right common iliac vein. The patient has a known IVC filter. 5. Poor definition of the left hepatic vein, significance uncertain. 6. Ascites. Wall thickening in the ascending colon, significance uncertain, possibly due to passive congestion. 7. Contrast medium in the distal esophagus, possibly from dysmotility  or reflux. 8. Coronary atherosclerosis. 9. 3.9 by 3.3 cm right adrenal myelolipoma. 10. Adenopathy along the porta hepatis and  adjacent mesentery. These results will be called to the ordering clinician or representative by the Radiologist Assistant, and communication documented in the PACS or zVision Dashboard.   Electronically Signed   By: Van Clines M.D.   On: 06/05/2015 20:54   US Abdomen Limited Ruq  06/05/2015   CLINICAL DATA:  History of duodenum carcinoma. Elevated alkaline phosphatase and total bilirubin.  EXAM: US ABDOMEN LIMITED - RIGHT UPPER QUADRANT  COMPARISON:  CT abdomen and pelvis 03/01/2015. PET CT scan 03/29/2015.  FINDINGS: Gallbladder:  Not visualized.  Common bile duct:  Diameter: 0.8 cm.  Liver:  The left lobe of the liver is not seen due to overlying bowel gas. Moderate appearing intrahepatic biliary ductal dilatation is seen in the right lobe. A mass lesion in the right lobe measures 8.7 x 8.6 x 8.3 cm and likely represents increase in the size of lesions seen on the prior CT abdomen and pelvis which had measured 2.5 cm. A small volume of perihepatic ascites is new since the prior examinations. No flow is demonstrated within the portal vein consistent with portal vein thrombosis as seen on prior examinations. A large heterogeneous mass lesion anterior to the right hepatic lobe measures 9.6 x 9.2 x 9.0 cm and likely represents increase in the size the patient's known duodenal carcinoma which measured 5.8 cm transverse on the prior PET CT scan.  IMPRESSION: This study is positive for moderate appearing intrahepatic biliary ductal dilatation and common bile duct dilatation at 0.8 cm.  Mass lesion anterior to the right hepatic lobe likely represents the patient's duodenal carcinoma which has increased in size.  Increase in the size of an intrahepatic mass lesion measuring 8.7 cm in diameter.  New moderate volume of perihepatic ascites.   Electronically Signed   By: Inge Rise M.D.   On: 06/05/2015 15:18    Medications: I have reviewed the patient's current medications.  Assessment/Plan: 1. Duodenal  mass, abdominal/retroperitoneal lymphadenopathy, and liver metastases noted on a CT 03/01/2015  Upper endoscopy 03/03/2015 confirmed a duodenal mass, status post a biopsy with the final pathology confirming a carcinoma, immunohistochemical stains negative for lymphoma, neuroendocrine carcinoma, and melanoma markers. Positive staining for CDX2, cytokeratin AE1/AE3, and CD10  PET scan 03/29/2015 showed a large intensely hypermetabolic mass circumferentially narrowing the second portion of the duodenum. Adjacent hypermetabolic lymph nodes medial to the duodenum mass. Irregular hypermetabolic nodal mass in the inferior aspect of the gastrohepatic ligament. Hypermetabolic nodal metastasis in the retroperitoneum left of the aorta. No abnormal metabolic activity within the enlarged inferior right lobe of the liver.  Cycle 1 FOLFOX 04/11/2015  Cycle 2 FOLFOX 04/25/2015  2. Gastric outlet obstruction secondary to #1, status post a palliative gastrojejunostomy 03/09/2015  3. 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 Portal vein thrombosis diagnosed in September 2015. Off of anticoagulation due to GI bleeding.  5. Left leg DVT December 2014. Off of anticoagulation due to GI bleeding.  Bilateral lower extremity DVTs confirmed on a Doppler 06/02/2015, status post IVC filter placement  6. History of coronary artery disease, status post an LAD drug-eluting stent July 2015  7. Remote history of melanoma of the right ear  8. 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 History of drug related ITP  10. Abdominal pain secondary to the duodenal mass/liver  metastases/adenopathy  11. Neuropathy  12. Altered mental status-likely secondary to lorazepam versus hepatic encephalopathy  13. External hemorrhoid-referred to Dr. Excell Seltzer  14. Admission 05/15/2015 with a fever and altered mental status-potentially related to a systemic infection, one blood culture positive for "gram variable rods "and urine streptococcus pneumoniae antigen positive. Discharged 05/20/2015.  He appears unchanged. I discussed disposition plans with Mr. Goto and his daughters. He has a poor prognosis for survival beyond weeks. I recommend a [facility] referral to consider home hospice care versus transfer to [facility] place.  I explained the rationale for consolidating his medical regimen for comfort. His daughter would like to discuss the indication for continuing diaphoretic's with his cardiologist.   Recommendations: 1. [place] hospice referral to consider transfer to [facility] place 2. Continue discussions regarding CODE STATUS with patient and family  3. Decrease lactulose dose 4. KVO IV fluids 5. Switch medicines to by mouth and discontinue medications not related to comfort     LOS: 6 days   James [name]  06/07/2015, 12:13 PM

## 2015-06-07 NOTE — Progress Notes (Signed)
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 Patient was transfused PRBCs and hemoglobin has stabilized. GI consulted and indicated that his GI bleed is related to duodenal adenocarcinoma and not variceal bleeding and did not recommend EGD. He has bilateral acute lower extremity DVTs and is status post IVC filter-not candidate for anticoagulation secondary to cancer related bleeding. He subsequently developed jaundice and hepatic encephalopathy related to liver metastases-not candidate for ERCP or PTC. Oncology, GI, IR have consulted and recommend comfort oriented care. Discussed with oncology 6/15 and indicate that his prognosis is very poor.   Assessment/Plan:  Metastatic duodenal adenocarcinoma to liver - As per oncology and as discussed with Dr. Benay Spice, extremely poor prognosis and recommends hospice care. Dr. Benay Spice discussing with patient and family. - Palliative care team supposed to meet with patient and family on 6/16 at noon.  Chronic upper GI bleed -Secondary to duodenal adenocarcinoma. As per oncology, no XRT. Await palliative care input regarding goals of care.  Symptomatic acute blood loss anemia - Related to upper GI bleed - Status post 3 units PRBCs on 06/02/15 and 1 unit on 06/03/15. Hemoglobin stable >9 g per DL.   Jaundice/hepatic metastasis/hepatic encephalopathy - Secondary to biliary obstruction from tumor. GI and IR input appreciated-not candidate for ERCP or PTC. - Adjusted/reduced lactulose dose secondary to multiple BMs. Target 3-4 BMs per 24 hours. - Mental status improving.  Acute bilateral  lower extremity DVT/PE - Anticoagulation contraindicated. Status post IVC filter placement 4/50/38  Chronic diastolic CHF - LVEF 88-28 percent - Lasix and Aldactone had been held for unclear reasons. Patient's daughter was concerned. Resumed Lasix and Aldactone on 6/15.  Hypothyroid - Continue Synthroid  Dyslipidemia - As discussed with patient's daughter, will continue to hold statin secondary to hepatic metastases and abnormal LFTs.  Thrombocytopenia - stable  Hyponatremia - likely d/t liver disease   DVT prophylaxis: No heparin products secondary to GI bleed. No SCD secondary to bilateral lower extremity acute DVTs. Patient has IVC filter.  Code Status: Full code  Family Communication: Discussed with patient's sister and friend at bedside. Discussed at length with patient's daughter via phone on 6/15.  Disposition Plan: To be determined pending palliative care input.  Consultants:  [place] [facility]   Interventional radiology   Palliative Care Consult.  Procedures:  Status post IVC filter placement   Antibiotics:  None   Subjective: Patient denies complaints. As per nursing, mental status has significantly improved. Had 3 BMs overnight and 4 during the day yesterday.   Objective: Filed Vitals:   06/06/15 1500 06/07/15 0330 06/07/15 0544 06/07/15 1430  BP: 130/65  129/61 133/68  Pulse: 70  83 77  Temp: 97.6 F (36.4 C)  98 F (36.7 C) 97.7 F (36.5 C)  TempSrc: Oral  Oral Oral  Resp: 18  20 19   Height:      Weight:  92.3 kg (203 lb 7.8 oz)    SpO2: 99%  99% 98%    Intake/Output Summary (Last 24 hours) at 06/07/15 1525 Last data filed at 06/07/15 1430  Gross per 24 hour  Intake    920 ml  Output    550 ml  Net  370 ml   Filed Weights   06/03/15 0334 06/06/15 0635 06/07/15 0330  Weight: 92 kg (202 lb 13.2 oz) 93.5 kg (206 lb 2.1 oz) 92.3 kg (203 lb 7.8 oz)     Exam:  General exam: Pleasant elderly male sitting propped up in bed  comfortably. Scleral and skin icterus.  Respiratory system: Diminished breath sounds in the bases but otherwise clear to auscultation. No increased work of breathing. Cardiovascular system: S1 & S2 heard, RRR. No JVD, murmurs, gallops, clicks. Trace bilateral ankle edema. Telemetry: Sinus rhythm.  Gastrointestinal system: Abdomen is nondistended, soft and nontender. Normal bowel sounds heard. Central nervous system: Alert and oriented. No focal neurological deficits. Extremities: Symmetric 5 x 5 power.   Data Reviewed: Basic Metabolic Panel:  Recent Labs Lab 06/01/15 1755 06/02/15 0500 06/03/15 0512 06/04/15 0510 06/06/15 0350  NA 132* 133* 135 134* 129*  K 4.8 4.1 4.0 4.2 4.1  CL 102 101 101 99* 99*  CO2 20* 23 25 24  20*  GLUCOSE 155* 141* 107* 98 113*  BUN 29* 26* 25* 24* 20  CREATININE 0.89 0.86 0.77 0.69 0.51*  CALCIUM 8.0* 8.0* 7.9* 8.1* 8.0*   Liver Function Tests:  Recent Labs Lab 06/01/15 1755 06/02/15 0500 06/05/15 0959 06/06/15 0350  AST 61* 45* 86* 87*  ALT 70* 69* 111* 118*  ALKPHOS 380* 376* 929* 981*  BILITOT 1.9* 2.9* 11.2* 12.0*  PROT 5.2* 5.1* 4.9* 5.0*  ALBUMIN 2.5* 2.4* 2.2* 2.2*   No results for input(s): LIPASE, AMYLASE in the last 168 hours.  Recent Labs Lab 06/05/15 0959 06/06/15 0350  AMMONIA 149* 170*   CBC:  Recent Labs Lab 06/03/15 0512 06/03/15 2121 06/04/15 0510 06/05/15 0345 06/06/15 0350  WBC 8.8 9.0 8.8 10.0 7.5  HGB 8.3* 9.6* 9.6* 9.8* 9.3*  HCT 25.6* 29.5* 29.3* 30.1* 28.9*  MCV 93.1 91.9 91.8 91.5 91.5  PLT 97* 98* 93* 111* 119*   Cardiac Enzymes: No results for input(s): CKTOTAL, CKMB, CKMBINDEX, TROPONINI in the last 168 hours. BNP (last 3 results)  Recent Labs  11/29/14 0944 03/22/15 1513  PROBNP 114.6 71.0   CBG:  Recent Labs Lab 06/06/15 0850 06/06/15 1214 06/06/15 1631 06/06/15 2115 06/07/15 0744  GLUCAP 92 113* 128* 141* 122*    No results found for this or any previous visit (from the  past 240 hour(s)).       Studies: Ct Abdomen W Contrast  06/05/2015   CLINICAL DATA:  Biliary obstruction.  EXAM: CT ABDOMEN WITH CONTRAST  TECHNIQUE: Multidetector CT imaging of the abdomen was performed using the standard protocol following bolus administration of intravenous contrast.  CONTRAST:  130m OMNIPAQUE IOHEXOL 300 MG/ML  SOLN  COMPARISON:  Multiple exams, including 03/29/2015 and 03/01/2015  FINDINGS: Lower chest: Today' s exam was not a CT angiogram of the chest. Nevertheless we demonstrate a large amount of clot in the left lower lobe pulmonary arterial tree, inclined also in the right middle lobe and right lower lobe pulmonary arterial tree. Coronary artery atherosclerosis is present. contrast medium is present in the esophagus. Thoracic aortic atherosclerotic calcification is observed. The patient does have a known IVC filter.  Hepatobiliary: Moderate intrahepatic biliary dilatation diffusely due to likely obstruction of the common bile duct by the large duodenal mass. New metastatic lesion in segment 6 of the liver, 15.7 by 8.6 cm. Gallbladder poorly seen. Poor definition of the left hepatic vein. 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  Pancreas: Unremarkable  Spleen: Unremarkable  Adrenals/Urinary Tract: 3.9 by 3.3 cm right adrenal myelolipoma.  Stomach/Bowel: The descending duodenum mass is much larger, currently 8.9 by 9.3 cm, formerly 5.8 by 6.9 cm on the prior PET-CT. Adjacent pathologic adenopathy along the porta hepatis.  Wall thickening in the ascending colon.  Vascular/Lymphatic: Duodenal mass causes extrinsic compression of the left portal vein and main portal vein proximally. Adenopathy along the porta hepatis including a node measuring 2.2 cm in short axis on image 37 series 2. IVC filter observed, there is thought to be thrombus in the right common iliac vein. Aortoiliac atherosclerotic vascular disease. Scattered mildly enlarged  mesenteric lymph nodes are present.  Other: Considerable upper abdominal ascites.  Musculoskeletal: Upper abdominal ascites.  IMPRESSION: 1. Bilateral lower lobe and right middle lobe pulmonary embolus. 2. Considerable enlargement of the circumferential duodenal mass. Dramatically enlarged right hepatic lobe metastatic lesion. 3. 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 4. There is thrombus visible in the right common iliac vein. The patient has a known IVC filter. 5. Poor definition of the left hepatic vein, significance uncertain. 6. Ascites. Wall thickening in the ascending colon, significance uncertain, possibly due to passive congestion. 7. Contrast medium in the distal esophagus, possibly from dysmotility or reflux. 8. Coronary atherosclerosis. 9. 3.9 by 3.3 cm right adrenal myelolipoma. 10. Adenopathy along the porta hepatis and adjacent mesentery. These results will be called to the ordering clinician or representative by the Radiologist Assistant, and communication documented in the PACS or zVision Dashboard.   Electronically Signed   By: Van Clines M.D.   On: 06/05/2015 20:54        Scheduled Meds: . antiseptic oral rinse  7 mL Mouth Rinse q12n4p  . chlorhexidine  15 mL Mouth Rinse BID  . DULoxetine  30 mg Oral BID  . feeding supplement (ENSURE ENLIVE)  237 mL Oral TID BM  . finasteride  5 mg Oral Daily  . furosemide  20 mg Oral Daily  . hydrocortisone-pramoxine  1 applicator Rectal BID  . lactulose  30 g Oral Daily  . levothyroxine  50 mcg Oral QAC breakfast  . pantoprazole  40 mg Oral BID WC  . saccharomyces boulardii  250 mg Oral Daily  . sodium chloride  10-40 mL Intracatheter Q12H  . sodium chloride  3 mL Intravenous Q12H  . spironolactone  12.5 mg Oral Daily   Continuous Infusions: . sodium chloride 10 mL/hr at 06/07/15 1219    Principal Problem:   Symptomatic anemia Active Problems:   OBESITY, MORBID    HYPERTENSION, BENIGN   HLD (hyperlipidemia)   Chronic diastolic CHF (congestive heart failure)   GI bleed   Gastrointestinal hemorrhage with melena   Biliary obstruction    Time spent: 30 minutes.    James Leep, MD, FACP, FHM. [facility] Pager [phone]  If 7PM-7AM, please contact night-coverage [email or web address] Password [email or web address] 06/07/2015, 3:25 PM    LOS: 6 days

## 2015-06-07 NOTE — Clinical Documentation Improvement (Signed)
Supporting Information: Patient with AMS, ammonia level 149, started on lactulose per 6/13 progress notes.  Labs: ammonia: 6/14: 170. 6/13: 149.   Possible Clinical Condition: . Document the etiology of the altered mental status as: --Coma --Confusion/delirium (including drug-induced) --Drowsiness/somnolence --Stupor/semi-coma --Transient alteration of awareness --Encephalopathy Alcoholic Anoxic/hypoxic Drug-induced/toxic (specify drug) Hepatic Hypertensive Hypoglycemic Metabolic/septic Traumatic/post-concussion Wernicke Other (specify) . Document any associated diagnoses/conditions     Thank Sherian Maroon Documentation Specialist [phone] Kacie [email or web address]

## 2015-06-07 NOTE — Plan of Care (Signed)
Problem: Phase II Progression Outcomes Goal: Progress activity as tolerated unless otherwise ordered Outcome: Not Progressing Patient to have a PT/OT consult

## 2015-06-07 NOTE — Progress Notes (Signed)
Patient has been more alert today than Monday and Tuesday.  He was able to stand and pivot to the chair using a walker.  He set up in chair for almost 3 hours and was then able to transfer back to bed with moderate assist and walker.  He tolerated activity well.  Family has requested for PT to come back and reevaluate him now that he is more alert than previous evaluation.  PT reported they will try and come in the morning.    [name] (daughter) was at bedside during transfer and still hopes to take him back home with 24hr care at discharge.

## 2015-06-07 NOTE — Care Management Note (Signed)
Case Management Note  Patient Details  Name: ULRIC SALZMAN MRN: [id number] Date of Birth: 11/29/1928  Subjective/Objective: Palliative care team following-meeting tomorrow @ 12p.Oncology following.Already active w/[facility].Will await recommendations.                   Action/Plan:Monitor progress for d/c needs.   Expected Discharge Date:   (unknown)               Expected Discharge Plan:  [place]  In-House Referral:  Clinical Social Work  Discharge planning Services  CM Consult  Post Acute Care Choice:  Home Health (Active w/[facility] HHRN/PT/OT/aide/SW) Choice offered to:     DME Arranged:  N/A DME Agency:  NA  HH Arranged:  NA HH Agency:  NA  Status of Service:  In process, will continue to follow  Medicare Important Message Given:  Yes Date Medicare IM Given:  06/05/15 Medicare IM give by:  Dessa Phi Date Additional Medicare IM Given:    Additional Medicare Important Message give by:     If discussed at [place] of Stay Meetings, dates discussed:    Additional Comments:  Dessa Phi, RN 06/07/2015, 10:08 AM

## 2015-06-08 ENCOUNTER — Telehealth: Payer: Self-pay | Admitting: *Deleted

## 2015-06-08 ENCOUNTER — Ambulatory Visit: Payer: Medicare Other | Admitting: Oncology

## 2015-06-08 ENCOUNTER — Other Ambulatory Visit: Payer: Medicare Other

## 2015-06-08 ENCOUNTER — Other Ambulatory Visit: Payer: Self-pay | Admitting: *Deleted

## 2015-06-08 DIAGNOSIS — Z66 Do not resuscitate: Secondary | ICD-10-CM

## 2015-06-08 DIAGNOSIS — D62 Acute posthemorrhagic anemia: Secondary | ICD-10-CM

## 2015-06-08 DIAGNOSIS — K922 Gastrointestinal hemorrhage, unspecified: Secondary | ICD-10-CM

## 2015-06-08 DIAGNOSIS — C170 Malignant neoplasm of duodenum: Secondary | ICD-10-CM

## 2015-06-08 DIAGNOSIS — Z515 Encounter for palliative care: Secondary | ICD-10-CM | POA: Insufficient documentation

## 2015-06-08 DIAGNOSIS — R531 Weakness: Secondary | ICD-10-CM

## 2015-06-08 LAB — COMPREHENSIVE METABOLIC PANEL
ALBUMIN: 2 g/dL — AB (ref 3.5–5.0)
ALT: 118 U/L — ABNORMAL HIGH (ref 17–63)
ANION GAP: 8 (ref 5–15)
AST: 95 U/L — ABNORMAL HIGH (ref 15–41)
Alkaline Phosphatase: 1035 U/L — ABNORMAL HIGH (ref 38–126)
BUN: 15 mg/dL (ref 6–20)
CO2: 19 mmol/L — ABNORMAL LOW (ref 22–32)
CREATININE: 0.52 mg/dL — AB (ref 0.61–1.24)
Calcium: 7.7 mg/dL — ABNORMAL LOW (ref 8.9–10.3)
Chloride: 105 mmol/L (ref 101–111)
GFR calc Af Amer: >60 mL/min (ref >60)
GFR calc non Af Amer: >60 mL/min (ref >60)
Glucose, Bld: 93 mg/dL (ref 65–99)
POTASSIUM: 3.7 mmol/L (ref 3.5–5.1)
Sodium: 132 mmol/L — ABNORMAL LOW (ref 135–145)
Total Bilirubin: 13.4 mg/dL — ABNORMAL HIGH (ref 0.3–1.2)
Total Protein: 4.6 g/dL — ABNORMAL LOW (ref 6.5–8.1)

## 2015-06-08 LAB — CBC
HCT: 25.3 % — ABNORMAL LOW (ref 39.0–52.0)
Hemoglobin: 7.9 g/dL — ABNORMAL LOW (ref 13.0–17.0)
MCH: 28.4 pg (ref 26.0–34.0)
MCHC: 31.2 g/dL (ref 30.0–36.0)
MCV: 91 fL (ref 78.0–100.0)
PLATELETS: 122 10*3/uL — AB (ref 150–400)
RBC: 2.78 MIL/uL — ABNORMAL LOW (ref 4.22–5.81)
RDW: 19.8 % — AB (ref 11.5–15.5)
WBC: 7.3 10*3/uL (ref 4.0–10.5)

## 2015-06-08 MED ORDER — HEPARIN SOD (PORK) LOCK FLUSH 100 UNIT/ML IV SOLN
500.0000 [IU] | INTRAVENOUS | Status: AC | PRN
  Administered 2015-06-08: 500 [IU]

## 2015-06-08 MED ORDER — LACTULOSE 10 GM/15ML PO SOLN: 30.0000 g | Freq: Every day | ORAL | Status: AC

## 2015-06-08 MED ORDER — HYDROCORTISONE ACE-PRAMOXINE 1-1 % RE FOAM: 1.0000 | Freq: Two times a day (BID) | RECTAL | Status: AC

## 2015-06-08 MED ORDER — PANTOPRAZOLE SODIUM 40 MG PO TBEC: 40.0000 mg | DELAYED_RELEASE_TABLET | Freq: Two times a day (BID) | ORAL | Status: AC

## 2015-06-08 MED ORDER — DIBUCAINE 1 % RE OINT: 1.0000 "application " | TOPICAL_OINTMENT | RECTAL | Status: AC | PRN

## 2015-06-08 NOTE — Discharge Summary (Addendum)
Physician Discharge Summary  Rhyatt Muska Bouknight PYK:[id number] DOB: 05-16-1928 [name]: 06/01/2015  PCP: [name], MD  Admit date: 06/01/2015 Discharge date: 06/08/2015  Time spent: Greater than 30 minutes  Recommendations for Outpatient Follow-up:  1. Dr. Ladell Pier, Oncology in 5 days with repeat labs (CBC & CMP) 2. Dr. [name], PCP 3. Patient will either go home with [facility] home health services or home hospice-decision pending.  Discharge Diagnoses:  Principal Problem:   Symptomatic anemia Active Problems:   OBESITY, MORBID   HYPERTENSION, BENIGN   HLD (hyperlipidemia)   Chronic diastolic CHF (congestive heart failure)   GI bleed   Gastrointestinal hemorrhage with melena   Biliary obstruction   DNR (do not resuscitate)   Weakness generalized   Palliative care encounter   Discharge Condition: Improved & Stable  Diet recommendation: Heart healthy diet.  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 Patient was transfused PRBCs and hemoglobin has stabilized. GI consulted and indicated that his GI bleed is related to duodenal adenocarcinoma and not variceal bleeding and did not recommend EGD. He has bilateral acute lower extremity DVTs and is status post IVC filter-not candidate for anticoagulation secondary to cancer related bleeding. He subsequently developed jaundice and hepatic encephalopathy related to liver metastases-not candidate for ERCP or PTC. Oncology, GI, IR have consulted and recommend comfort oriented care. Discussed with oncology 6/15 and indicate  that his prognosis is very poor.  Hospital Course:   Metastatic duodenal adenocarcinoma to liver - As per oncology and as discussed with Dr. Benay Spice, extremely poor prognosis and recommends hospice care. Dr. Benay Spice discussing with patient and family. - Discussed with palliative care who met with patient short while ago: Have changed CODE STATUS to DO NOT RESUSCITATE. They expect lab draws to monitor hemoglobin, outpatient blood transfusions and IV hydration if indicated, hopeful for as much prolonged quality of life as possible. They're hopeful for hospice services but if they are not deemed eligible, they will return home with home health services. Family understand the overall poor prognosis. - Discussed with primary oncologist will arrange for outpatient follow-up next week with lab draws.  Chronic upper GI bleed -Secondary to duodenal adenocarcinoma. As per oncology, no XRT. Palliative care input appreciated.  Symptomatic acute blood loss anemia - Related to upper GI bleed - Status post 3 units PRBCs on 06/02/15 and 1 unit on 06/03/15.  - Hemoglobin has dropped from 9.3 > 7.9 but no overt bleeding reported. - Outpatient follow-up with repeat CBC in 5 days-discussed with oncologist who agrees no transfusion at this time and he will arrange for follow-up with labs.  Jaundice/hepatic metastasis/hepatic encephalopathy - Secondary to biliary obstruction from tumor. GI and IR input appreciated-not candidate for ERCP or PTC. - Adjusted/reduced lactulose dose secondary to multiple BMs. Target 3-4 BMs per 24 hours. - Mental status improving.  Acute bilateral lower extremity DVT/PE - Anticoagulation contraindicated. Status post IVC filter placement 5/39/76  Chronic diastolic CHF - LVEF 73-41 percent - Lasix and Aldactone had been held for unclear reasons. Patient's daughter was concerned. Resumed Lasix and Aldactone on 6/15.  Hypothyroid - Continue Synthroid  Dyslipidemia - As discussed  with patient's daughter, will continue to hold statin secondary to hepatic metastases and abnormal LFTs.  Thrombocytopenia - stable  Hyponatremia - likely d/t liver disease  DO NOT RESUSCITATE   Consultants:  [facility]   Interventional radiology   Palliative Care team  Procedures:  Status post IVC filter placement   Discharge Exam:  Complaints:  Patient complained of some weakness but otherwise denied any other complaints. Denied dizziness, lightheadedness, dyspnea or chest pain.  Filed Vitals:   06/07/15 0544 06/07/15 1430 06/07/15 2114 06/08/15 0505  BP: 129/61 133/68 123/59 128/63  Pulse: 83 77 80 71  Temp: 98 F (36.7 C) 97.7 F (36.5 C) 97.6 F (36.4 C) 97.7 F (36.5 C)  TempSrc: Oral Oral Oral Oral  Resp: 20 19 18 18   Height:      Weight:    93.7 kg (206 lb 9.1 oz)  SpO2: 99% 98% 99% 99%    General exam: Pleasant elderly male sitting up at edge of bed with PT-pleasant smiling and in no obvious distress. Scleral and skin icterus.  Respiratory system: Diminished breath sounds in the bases but otherwise clear to auscultation. No increased work of breathing. Cardiovascular system: S1 & S2 heard, RRR. No JVD, murmurs, gallops, clicks. Trace bilateral ankle edema. Gastrointestinal system: Abdomen is nondistended, soft and nontender. Normal bowel sounds heard. Central nervous system: Alert and oriented. No focal neurological deficits. Minimal asterixis. Extremities: Symmetric 5 x 5 power.  Discharge Instructions      Discharge Instructions    (HEART FAILURE PATIENTS) Call MD:  Anytime you have any of the following symptoms: 1) 3 pound weight gain in 24 hours or 5 pounds in 1 week 2) shortness of breath, with or without a dry hacking cough 3) swelling in the hands, feet or stomach 4) if you have to sleep on extra pillows at night in order to breathe.    Complete by:  As directed      Call MD for:  difficulty breathing, headache or visual  disturbances    Complete by:  As directed      Call MD for:  extreme fatigue    Complete by:  As directed      Call MD for:  persistant dizziness or light-headedness    Complete by:  As directed      Call MD for:  persistant nausea and vomiting    Complete by:  As directed      Call MD for:  severe uncontrolled pain    Complete by:  As directed      Call MD for:  temperature >100.4    Complete by:  As directed      Call MD for:    Complete by:  As directed   Worsening confusion.     Diet - low sodium heart healthy    Complete by:  As directed      Increase activity slowly    Complete by:  As directed             Medication List    STOP taking these medications        fluconazole 100 MG tablet  Commonly known as:  DIFLUCAN     hydrocortisone 2.5 % rectal cream  Commonly known as:  ANUSOL-HC     LIVALO 4 MG Tabs  Generic drug:  Pitavastatin Calcium     loperamide 2 MG tablet  Commonly known as:  IMODIUM A-D     METAMUCIL PO     pramoxine 1 % foam  Commonly known as:  PROCTOFOAM     pramoxine-hydrocortisone 1-1 % rectal cream  Commonly known as:  PROCTOCREAM-HC  Replaced by:  hydrocortisone-pramoxine rectal foam     predniSONE 5 MG tablet  Commonly known as:  DELTASONE      TAKE these medications        benzonatate 200 MG capsule  Commonly known as:  TESSALON  Take 1 capsule (200 mg total) by mouth 3 (three) times daily as needed.     dibucaine 1 % Oint  Commonly known as:  NUPERCAINAL  Place 1 application rectally as needed for pain or hemorrhoids.     DULoxetine 30 MG capsule  Commonly known as:  CYMBALTA  One capsule daily for one week, then take one capsule twice a day     feeding supplement (ENSURE ENLIVE) Liqd  Take 237 mLs by mouth 3 (three) times daily between meals.     finasteride 5 MG tablet  Commonly known as:  PROSCAR  Take 5 mg by mouth daily.     furosemide 40 MG tablet  Commonly known as:  LASIX  Take 0.5 tablets (20 mg total) by  mouth daily.     hydrocortisone-pramoxine rectal foam  Commonly known as:  PROCTOFOAM-HC  Place 1 applicator rectally 2 (two) times daily.     lactulose 10 GM/15ML solution  Commonly known as:  CHRONULAC  Take 45 mLs (30 g total) by mouth daily.     levothyroxine 50 MCG tablet  Commonly known as:  SYNTHROID, LEVOTHROID  Take 1 tablet (50 mcg total) by mouth daily before breakfast.     lidocaine-prilocaine cream  Commonly known as:  EMLA  Apply 1 application topically as needed.     multivitamin with minerals Tabs tablet  Take 1 tablet by mouth daily.     nitroGLYCERIN 0.4 MG SL tablet  Commonly known as:  NITROSTAT  Place 1 tablet (0.4 mg total) under the tongue every 5 (five) minutes as needed. For chest pain     OVER THE COUNTER MEDICATION  Place 2 drops into both eyes 4 (four) times daily. Wash type eye drop-     oxyCODONE-acetaminophen 5-325 MG per tablet  Commonly known as:  ROXICET  Take 1 tablet by mouth every 4 (four) hours as needed for severe pain.     pantoprazole 40 MG tablet  Commonly known as:  PROTONIX  Take 1 tablet (40 mg total) by mouth 2 (two) times daily.     POLY-IRON 150 FORTE 150-0.025-1 MG Caps  Generic drug:  Iron Polysacch [id number]  TAKE 1 CAPSULE TWICE A DAY     PRESCRIPTION MEDICATION  Chemo; [facility].     PROBIOTIC & ACIDOPHILUS EX ST PO  Take 1 tablet by mouth every morning.     prochlorperazine 5 MG tablet  Commonly known as:  COMPAZINE  Take 1 tablet (5 mg total) by mouth every 6 (six) hours as needed for nausea or vomiting.     spironolactone 25 MG tablet  Commonly known as:  ALDACTONE  Take 0.5 tablets (12.5 mg total) by mouth daily.     Vitamin D3 2000 UNITS capsule  Take 2,000 Units by mouth every morning.       Follow-up Information    Follow up with [name], MD. Schedule an appointment as soon as possible for a visit in 3 days.   Specialty:  Family  Medicine   Why:  to be seen with repeat labs (CBC & CMP)    Contact information:   [place] [address] [phone]       Schedule an appointment as soon as possible for a visit with Betsy Coder, MD.   Specialty:  Oncology   Contact information:   [place] [address] [phone]        The results of significant diagnostics from this hospitalization (including imaging, microbiology, ancillary and laboratory) are listed below for reference.    Significant Diagnostic Studies: Dg Chest 2 View  05/16/2015   CLINICAL DATA:  Shortness of breath with fever and chills. Metastatic duodenum cancer  EXAM: CHEST  2 VIEW  COMPARISON:  May 15, 2015  FINDINGS: Port-A-Cath tip is in the superior vena cava. No pneumothorax. There is no edema or consolidation. There is slight bibasilar lung scarring. Heart is upper limits normal in size with pulmonary vascularity within normal limits. No adenopathy. No bone lesions.  IMPRESSION: Slight bibasilar lung scarring.  No edema or consolidation.   Electronically Signed   By: Lowella Grip III M.D.   On: 05/16/2015 11:46   Dg Chest 2 View  05/15/2015   CLINICAL DATA:  Pt from home, complains of emesis and dizziness, denies weakness. Hx colon cancer.  EXAM: CHEST  2 VIEW  COMPARISON:  Cough 04/12/2015  FINDINGS: Cardiac silhouette mildly enlarged. No mediastinal or hilar masses or evidence of adenopathy.  Minor reticular scarring in the antral lateral lung bases. This is unchanged. No lung consolidation or edema. No pleural effusion or pneumothorax.  Right anterior chest wall Port-A-Cath is stable.  Bony thorax is intact.  IMPRESSION: No acute cardiopulmonary disease.   Electronically Signed   By: Lajean Manes M.D.   On: 05/15/2015 17:08   Ct Abdomen W Contrast  06/05/2015   CLINICAL DATA:  Biliary obstruction.  EXAM: CT ABDOMEN WITH CONTRAST  TECHNIQUE: Multidetector CT imaging of the abdomen was performed using the standard protocol following bolus administration of  intravenous contrast.  CONTRAST:  169m OMNIPAQUE IOHEXOL 300 MG/ML  SOLN  COMPARISON:  Multiple exams, including 03/29/2015 and 03/01/2015  FINDINGS: Lower chest: Today' s exam was not a CT angiogram of the chest. Nevertheless we demonstrate a large amount of clot in the left lower lobe pulmonary arterial tree, inclined also in the right middle lobe and right lower lobe pulmonary arterial tree. Coronary artery atherosclerosis is present. contrast medium is present in the esophagus. Thoracic aortic atherosclerotic calcification is observed. The patient does have a known IVC filter.  Hepatobiliary: Moderate intrahepatic biliary dilatation diffusely due to likely obstruction of the common bile duct by the large duodenal mass. New metastatic lesion in segment 6 of the liver, 15.7 by 8.6 cm. Gallbladder poorly seen. Poor definition of the left hepatic vein. 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  Pancreas: Unremarkable  Spleen: Unremarkable  Adrenals/Urinary Tract: 3.9 by 3.3 cm right adrenal myelolipoma.  Stomach/Bowel: The descending duodenum mass is much larger, currently 8.9 by 9.3 cm, formerly 5.8 by 6.9 cm on the prior PET-CT. Adjacent pathologic adenopathy along the porta hepatis.  Wall thickening in the ascending colon.  Vascular/Lymphatic: Duodenal mass causes extrinsic compression of the left portal vein and main portal vein proximally. Adenopathy along the porta hepatis including a node measuring 2.2 cm in short axis on image 37 series 2. IVC filter observed, there is thought to  be thrombus in the right common iliac vein. Aortoiliac atherosclerotic vascular disease. Scattered mildly enlarged mesenteric lymph nodes are present.  Other: Considerable upper abdominal ascites.  Musculoskeletal: Upper abdominal ascites.  IMPRESSION: 1. Bilateral lower lobe and right middle lobe pulmonary embolus. 2. Considerable enlargement of the circumferential duodenal mass.  Dramatically enlarged right hepatic lobe metastatic lesion. 3. 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 4. There is thrombus visible in the right common iliac vein. The patient has a known IVC filter. 5. Poor definition of the left hepatic vein, significance uncertain. 6. Ascites. Wall thickening in the ascending colon, significance uncertain, possibly due to passive congestion. 7. Contrast medium in the distal esophagus, possibly from dysmotility or reflux. 8. Coronary atherosclerosis. 9. 3.9 by 3.3 cm right adrenal myelolipoma. 10. Adenopathy along the porta hepatis and adjacent mesentery. These results will be called to the ordering clinician or representative by the Radiologist Assistant, and communication documented in the PACS or zVision Dashboard.   Electronically Signed   By: Van Clines M.D.   On: 06/05/2015 20:54   Ir Ivc Filter Plmt / S&i /img Guid/mod Sed  06/02/2015   CLINICAL DATA:  Bilateral lower extremity DVT, duodenum carcinoma and GI bleed. The patient requires placement of an IVC filter for protection from thromboembolic disease.  EXAM: 1. ULTRASOUND GUIDANCE FOR VASCULAR ACCESS OF THE RIGHT COMMON FEMORAL VEIN. 2. IVC VENOGRAM. 3. PERCUTANEOUS IVC FILTER PLACEMENT.  ANESTHESIA/SEDATION: 1.0 mg IV Versed; 25 mcg IV Fentanyl.  Total Moderate Sedation Time  23mnutes.  CONTRAST:  151mOMNIPAQUE IOHEXOL 300 MG/ML SOLN, 2577mMNIPAQUE IOHEXOL 300 MG/ML SOLN  FLUOROSCOPY TIME:  1 minutes and 18 seconds.  PROCEDURE: The procedure, risks, benefits, and alternatives were explained to the patient. Questions regarding the procedure were encouraged and answered. The patient understands and consents to the procedure.  A time-out was performed prior to the procedure. The right groin was prepped with Betadine in a sterile fashion, and a sterile drape was applied covering the operative field. A sterile gown and sterile gloves were used for the  procedure. Local anesthesia was provided with 1% Lidocaine.  Patency of the right common femoral vein was confirmed by ultrasound. Under direct ultrasound guidance, a 21 gauge needle was advanced into the right common femoral vein with ultrasound image documentation performed. After securing access with a micropuncture dilator, a guidewire was advanced into the inferior vena cava. A deployment sheath was advanced over the guidewire. This was utilized to perform IVC venography.  The deployment sheath was further positioned in an appropriate location for filter deployment. A Bard Denali IVC filter was then advanced in the sheath. This was then fully deployed in the infrarenal IVC. Final filter position was confirmed with a fluoroscopic spot image. Contrast injection was also performed through the sheath under fluoroscopy to confirm patency of the IVC at the level of the filter. After the procedure the sheath was removed and hemostasis obtained with manual compression.  COMPLICATIONS: None.  FINDINGS: IVC venography demonstrates a normal caliber IVC with no evidence of thrombus. Renal veins are identified bilaterally. The IVC filter was successfully positioned below the level of the renal veins and is appropriately oriented. This IVC filter has both permanent and retrievable indications.  IMPRESSION: Placement of percutaneous IVC filter in infrarenal IVC. IVC venogram shows no evidence of IVC thrombus and normal caliber of the inferior vena cava. This filter does have both permanent and retrievable indications. Given the  patient's history of metastatic malignancy and poor performance status, the filter will be assumed to be a permanent filter.   Electronically Signed   By: Aletta Edouard M.D.   On: 06/02/2015 17:14   US Abdomen Limited Ruq  06/05/2015   CLINICAL DATA:  History of duodenum carcinoma. Elevated alkaline phosphatase and total bilirubin.  EXAM: US ABDOMEN LIMITED - RIGHT UPPER QUADRANT  COMPARISON:  CT  abdomen and pelvis 03/01/2015. PET CT scan 03/29/2015.  FINDINGS: Gallbladder:  Not visualized.  Common bile duct:  Diameter: 0.8 cm.  Liver:  The left lobe of the liver is not seen due to overlying bowel gas. Moderate appearing intrahepatic biliary ductal dilatation is seen in the right lobe. A mass lesion in the right lobe measures 8.7 x 8.6 x 8.3 cm and likely represents increase in the size of lesions seen on the prior CT abdomen and pelvis which had measured 2.5 cm. A small volume of perihepatic ascites is new since the prior examinations. No flow is demonstrated within the portal vein consistent with portal vein thrombosis as seen on prior examinations. A large heterogeneous mass lesion anterior to the right hepatic lobe measures 9.6 x 9.2 x 9.0 cm and likely represents increase in the size the patient's known duodenal carcinoma which measured 5.8 cm transverse on the prior PET CT scan.  IMPRESSION: This study is positive for moderate appearing intrahepatic biliary ductal dilatation and common bile duct dilatation at 0.8 cm.  Mass lesion anterior to the right hepatic lobe likely represents the patient's duodenal carcinoma which has increased in size.  Increase in the size of an intrahepatic mass lesion measuring 8.7 cm in diameter.  New moderate volume of perihepatic ascites.   Electronically Signed   By: Inge Rise M.D.   On: 06/05/2015 15:18    Microbiology: No results found for this or any previous visit (from the past 240 hour(s)).   Labs: Basic Metabolic Panel:  Recent Labs Lab 06/02/15 0500 06/03/15 0512 06/04/15 0510 06/06/15 0350 06/08/15 0420  NA 133* 135 134* 129* 132*  K 4.1 4.0 4.2 4.1 3.7  CL 101 101 99* 99* 105  CO2 23 25 24  20* 19*  GLUCOSE 141* 107* 98 113* 93  BUN 26* 25* 24* 20 15  CREATININE 0.86 0.77 0.69 0.51* 0.52*  CALCIUM 8.0* 7.9* 8.1* 8.0* 7.7*   Liver Function Tests:  Recent Labs Lab 06/01/15 1755 06/02/15 0500 06/05/15 0959 06/06/15 0350  06/08/15 0420  AST 61* 45* 86* 87* 95*  ALT 70* 69* 111* 118* 118*  ALKPHOS 380* 376* 929* 981* 1035*  BILITOT 1.9* 2.9* 11.2* 12.0* 13.4*  PROT 5.2* 5.1* 4.9* 5.0* 4.6*  ALBUMIN 2.5* 2.4* 2.2* 2.2* 2.0*   No results for input(s): LIPASE, AMYLASE in the last 168 hours.  Recent Labs Lab 06/05/15 0959 06/06/15 0350  AMMONIA 149* 170*   CBC:  Recent Labs Lab 06/03/15 2121 06/04/15 0510 06/05/15 0345 06/06/15 0350 06/08/15 0420  WBC 9.0 8.8 10.0 7.5 7.3  HGB 9.6* 9.6* 9.8* 9.3* 7.9*  HCT 29.5* 29.3* 30.1* 28.9* 25.3*  MCV 91.9 91.8 91.5 91.5 91.0  PLT 98* 93* 111* 119* 122*   Cardiac Enzymes: No results for input(s): CKTOTAL, CKMB, CKMBINDEX, TROPONINI in the last 168 hours. BNP: BNP (last 3 results)  Recent Labs  05/15/15 1644  BNP 157.2*    ProBNP (last 3 results)  Recent Labs  11/29/14 0944 03/22/15 1513  PROBNP 114.6 71.0    CBG:  Recent Labs Lab  06/06/15 0850 06/06/15 1214 06/06/15 1631 06/06/15 2115 06/07/15 0744  GLUCAP 92 113* 128* 141* 122*   Discussed extensively and on multiple occasions with Dr. Benay Spice, Ms. Davis Gourd and family- Ms. Thomes Cake, another daughter and sister at bedside.   Signed:  Vernell Leep, MD, FACP, FHM. [facility] Pager [phone]  If 7PM-7AM, please contact night-coverage [email or web address] Password [email or web address] 06/08/2015, 1:58 PM

## 2015-06-08 NOTE — Progress Notes (Signed)
IP PROGRESS NOTE  Subjective:   James [name] is alert. He is eating breakfast in conversing with his family this morning.  Objective: Vital signs in last 24 hours: Blood pressure 128/63, pulse 71, temperature 97.7 F (36.5 C), temperature source Oral, resp. rate 18, height 6' (1.829 m), weight 206 lb 9.1 oz (93.7 kg), SpO2 99 %.  Intake/Output from previous day: 06/15 0701 - 06/16 0700 In: 837 [P.O.:717; I.V.:120] Out: 1100 [Urine:1100]  Physical Exam:  HEENT: Scleral icterus Neurologic: Alert and oriented  Portacath/PICC-without erythema  Lab Results:  Recent Labs  06/06/15 0350 06/08/15 0420  WBC 7.5 7.3  HGB 9.3* 7.9*  HCT 28.9* 25.3*  PLT 119* 122*    BMET  Recent Labs  06/06/15 0350 06/08/15 0420  NA 129* 132*  K 4.1 3.7  CL 99* 105  CO2 20* 19*  GLUCOSE 113* 93  BUN 20 15  CREATININE 0.51* 0.52*  CALCIUM 8.0* 7.7*    Studies/Results: No results found.  Medications: I have reviewed the patient's current medications.  Assessment/Plan: 1. Duodenal mass, abdominal/retroperitoneal lymphadenopathy, and liver metastases noted on a CT 03/01/2015  Upper endoscopy 03/03/2015 confirmed a duodenal mass, status post a biopsy with the final pathology confirming a carcinoma, immunohistochemical stains negative for lymphoma, neuroendocrine carcinoma, and melanoma markers. Positive staining for CDX2, cytokeratin AE1/AE3, and CD10  PET scan 03/29/2015 showed a large intensely hypermetabolic mass circumferentially narrowing the second portion of the duodenum. Adjacent hypermetabolic lymph nodes medial to the duodenum mass. Irregular hypermetabolic nodal mass in the inferior aspect of the gastrohepatic ligament. Hypermetabolic nodal metastasis in the retroperitoneum left of the aorta. No abnormal metabolic activity within the enlarged inferior right lobe of the liver.  Cycle 1 FOLFOX 04/11/2015  Cycle 2 FOLFOX 04/25/2015  2. Gastric outlet obstruction secondary to #1,  status post a palliative gastrojejunostomy 03/09/2015  3. 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 Portal vein thrombosis diagnosed in September 2015. Off of anticoagulation due to GI bleeding.  5. Left leg DVT December 2014. Off of anticoagulation due to GI bleeding.  Bilateral lower extremity DVTs confirmed on a Doppler 06/02/2015, status post IVC filter placement  6. History of coronary artery disease, status post an LAD drug-eluting stent July 2015  7. Remote history of melanoma of the right ear  8. 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 History of drug related ITP  10. Abdominal pain secondary to the duodenal mass/liver metastases/adenopathy  11. Neuropathy  12. Altered mental status-likely secondary to lorazepam versus hepatic encephalopathy  13. External hemorrhoid-referred to Dr. Excell Seltzer  14. Admission 05/15/2015 with a fever and altered mental status-potentially related to a systemic infection, one blood culture positive for "gram variable rods "and urine streptococcus pneumoniae antigen positive. Discharged 05/20/2015.  He is more alert, but remains in a weakened condition secondary to the advanced metastatic carcinoma. His prognosis is poor. I discussed the situation with James [name] and his daughters. They agree to Hospice care and desire to take him home with Hospice. They understand he will need home care in addition to services provided by hospice.  Recommendations: 1. [facility] hospice referral , discuss potential for home hospice care with the [facility] 2. Continue discussions regarding CODE  STATUS with  patient and family   Home with hospice as soon as home care arrangements can be made.     LOS: 7 days   Daryus Sowash  06/08/2015, 9:01 AM

## 2015-06-08 NOTE — Progress Notes (Signed)
Physical Therapy Treatment Patient Details Name: James [name] MRN: [id number] DOB: 01/02/1928 Today's Date: 06/08/2015    History of Present Illness 81 yo male admitted with GI bleed, anemia, IVC filter placed 6/10. Hx of duodenal carcinoma with liver mets, HTN, chronic anemia, bradycardia, morbid obesity, L TKA, DVT, CHF.     PT Comments    Pt alert and feeling better.  Multiple family members in room as we assisted OOB to amb a short distance in hallway.  Activity limited by 2 episodes of loose stools.  Increased session time needed.  Positioned in recliner to comfort and adjustment due to buttock pain.  Follow Up Recommendations  Home health PT (family has decided to take pt home with Hospice srevices.  Will update LPT)     Equipment Recommendations       Recommendations for Other Services       Precautions / Restrictions Precautions Precautions: Fall Restrictions Weight Bearing Restrictions: No    Mobility  Bed Mobility Overal bed mobility: Needs Assistance Bed Mobility: Supine to Sit     Supine to sit: Min assist;Mod assist     General bed mobility comments: mod assist for upper body and increased time for scooting due to buttocks pain  Transfers Overall transfer level: Needs assistance Equipment used: Rolling walker (2 wheeled) Transfers: Sit to/from Stand Sit to Stand: Min assist         General transfer comment: 25% VC's on proper tech and hand placement  Ambulation/Gait Ambulation/Gait assistance: +2 safety/equipment;Min assist Ambulation Distance (Feet): 28 Feet Assistive device: Rolling walker (2 wheeled) Gait Pattern/deviations: Step-through pattern;Decreased stride length;Trunk flexed Gait velocity: decreased   General Gait Details: cues for posture, pace and position from RW.  Ltd by onset incontinence of bowel   Stairs            Wheelchair Mobility    Modified Rankin (Stroke Patients Only)       Balance                                     Cognition Arousal/Alertness: Awake/alert Behavior During Therapy: WFL for tasks assessed/performed Overall Cognitive Status: Within Functional Limits for tasks assessed                      Exercises      General Comments        Pertinent Vitals/Pain Pain Assessment: Faces Faces Pain Scale: Hurts little more Pain Location: buttocks Pain Descriptors / Indicators: Grimacing Pain Intervention(s): Monitored during session;Repositioned    Home Living                      Prior Function            PT Goals (current goals can now be found in the care plan section) Progress towards PT goals: Progressing toward goals    Frequency  Min 3X/week    PT Plan      Co-evaluation             End of Session Equipment Utilized During Treatment: Gait belt Activity Tolerance: Patient limited by fatigue Patient left: in chair;with call bell/phone within reach;with family/visitor present     Time: 0905-0950 PT Time Calculation (min) (ACUTE ONLY): 45 min  Charges:  $Gait Training: 8-22 mins $Therapeutic Activity: 23-37 mins  G Codes:      Rica Koyanagi  PTA [facility]  Acute  Rehab Pager      [phone]

## 2015-06-08 NOTE — Progress Notes (Signed)
Resumed care of patient. Agree with previous assessment. Will continue to monitor.  James [name]

## 2015-06-08 NOTE — Telephone Encounter (Signed)
Message from Harris with hospice, they received order for admission. Need to know if Dr. Benay Spice will be attending and would he like symptom management from hospice MD. Returned call, verbal order for both given to [facility].

## 2015-06-08 NOTE — Consult Note (Signed)
Consultation Note Date: 06/08/2015   Patient Name: James [name]  DOB: 1928/12/23  MRN: [id number]  Age / Sex: 81 y.o., male   PCP: Tonia Ghent, MD Referring Physician: Modena Jansky, MD  Reason for Consultation: Disposition, Establishing goals of care, Non pain symptom management, Pain control and Psychosocial/spiritual support  Palliative Care Assessment and Plan Summary of Established Goals of Care and Medical Treatment Preferences    Palliative Care Discussion Held Today:    This NP Wadie Lessen reviewed medical records, received report from team, assessed the patient and then meet at the patient's bedside along with daughter Lawernce Pitts Argentina Donovan, daughter Jackelyn Poling and Modena Slater  to discuss diagnosis prognosis, [place], EOL wishes disposition and options.  A detailed discussion was had today regarding advanced directives.  Concepts specific to code status, artifical feeding and hydration, continued IV antibiotics and rehospitalization was had.  The difference between a aggressive medical intervention path  and a palliative comfort care path for this patient at this time was had.  Values and goals of care important to patient and family were attempted to be elicited.  Family are still hopeful for as much prolonged quality of life as possible, they are open to OP blood transfusions and IV hydration if indicate.  Their expectation is lab draws to monitor hemoglobin.  They are hopeful for hospice services but if they are not deemed eligible they will retrun home with Home Health services.   Will write for hospice choice.  Family understand the overall poor prognosis but they also  strongly verbalize that "knowing" [name] and what he values that it is important to attempt prolonging life as long as he can remain at current baseline.  Patient is in agreement.  Concepts of Hospice and Palliative Care were discussed.  Natural trajectory and expectations at EOL were discussed.  Questions and  concerns addressed.  Family encouraged to call with questions or concerns.  PMT will continue to support holistically.  MOST form introduced   Contacts/Participants in Discussion:  Primary Decision Maker:  [name] Scheck/HPOA  Goals of Care/Code Status/Advance Care Planning:   Code Status: DNR-documented today  Artificial hydration: yes  Antibiotics:yes  Blood transfusion: yes  Diagnostics: yes  Rehospitalization: avoid rehospitalizations    Psycho-social/Spiritual:   Support System: Family  Desire for further Chaplaincy support:no--strong community church support  Prognosis: < 6 months  Discharge Planning:  Home with Hospice vs home with home health       Chief Complaint:  weakness  History of Present Illness:  81 year old male with 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 Patient was transfused PRBCs and hemoglobin has stabilized. GI consulted and indicated that his GI bleed is related to duodenal adenocarcinoma and not variceal bleeding and did not recommend EGD. He has bilateral acute lower extremity DVTs and is status post IVC filter-not candidate for anticoagulation secondary to cancer related bleeding. He subsequently developed jaundice and hepatic encephalopathy related to liver metastases-not candidate for ERCP or PTC. Oncology, GI, IR have consulted and recommend comfort oriented care. Discussed with oncology 6/15 and indicate that his prognosis is very poor.  Patient and family faced with advanced directive decisions and anticipatory care needs.  Primary Diagnoses  Present on Admission:  . Chronic diastolic CHF (congestive heart failure) . HYPERTENSION, BENIGN . HLD (hyperlipidemia) .  OBESITY, MORBID . GI bleed  Palliative Review of  Systems:    -weakness, fatigue   I have reviewed the medical record, interviewed the patient and family, and examined the patient. The following aspects are pertinent.  Past Medical History  Diagnosis Date  . Bradycardia     Hypertensive  . carotid bruit, right   . hypercholesterolemia     Trig 234/293;takes Simcor daily  . Obesity, morbid   . Vasculitis   . 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 Beryle Beams; Dr. Beryle Beams , normal platelets  . Dermatitis     legs  . Bleeding gums   . Chronic low back pain   . Myalgia and myositis   . Unspecified vitamin D deficiency   . Bladder diverticulum   . Primary osteoarthritis of both knees   . Nephrolithiasis   . History of elevated glucose   . Thrombophlebitis     right forearm  . History of BPH   . History of colonic polyps     [name]  . Epididymitis, left     S/P Epidimectomy  . Splenomegaly 05/15/09    abd U/S NML  . Hypertension, benign     takes Micardis daily  . Neuropathy     acute  . Bruises easily   . H/O hiatal hernia   . GERD (gastroesophageal reflux disease)     hx of but doesn't take any meds now  . Urinary frequency   . Urinary urgency   . Nocturia   . Urinary leakage   . Abnormality of gait 03/19/2013  . DVT of lower limb, acute 11/19/2013    Peroneal & distal tibial left 11/01/13  . Polio 1941    "mild case"  . CHF (congestive heart failure)     "low grade" (06/22/2014)  . DVT (deep venous thrombosis) 10/2013    LLE  . Pneumonia, bacterial     RML resolved, spirometry, normal  . Pneumonia 1933  . History of blood transfusion 05/2014  . Anemia 05/2014    "related to Xarelto"  . Iron deficiency anemia 05/2014    "got iron infusion"  . Arthritis     "all over"  . Gout   . Malignant melanoma of ear     right  . Thrombophlebitis of superficial veins of right lower extremity 06/23/2014    Incidental finding doppler 05/30/14  . Shortness of breath   .  Esophageal varices without mention of bleeding 08/08/2014  . Portal vein thrombosis   . Duodenal cancer    History   Social History  . Marital Status: Widowed    Spouse Name: N/A  . Number of Children: 2  . Years of Education: college   Occupational History  . Retired     1996 [employer]   Social History Main Topics  . Smoking status: Former Smoker -- 2.00 packs/day for 20 years    Types: Cigarettes, Cigars    Quit date: 09/27/1967  . Smokeless tobacco: Never Used  . Alcohol Use: No  . Drug Use: No  . Sexual Activity: Not on file   Other Topics Concern  . None   Social History Narrative   Widowed 2013 after 63+ years of marriage, Wife had CHF and multiple myeloma   [name], daughter in Sports coach, lives with patient   Patient does not get regular exercise   No caffeine   Retired from [employer] ([employer]), retired from [employer]    Patient is left handed.   Family History  Problem Relation Age of Onset  . Thyroid cancer Mother   . Diabetes Father   . Heart disease Father   . Stroke Brother     cerebral hemm  . Colon cancer Brother   . Anesthesia problems Neg Hx   . Hypotension Neg Hx   . Malignant hyperthermia Neg Hx   . Pseudochol deficiency Neg Hx   . Prostate cancer Neg Hx   . Heart attack Father    Scheduled Meds: . antiseptic oral rinse  7 mL Mouth Rinse q12n4p  . chlorhexidine  15 mL Mouth Rinse BID  . DULoxetine  30 mg Oral BID  . feeding supplement (ENSURE ENLIVE)  237 mL Oral TID BM  . finasteride  5 mg Oral Daily  . furosemide  20 mg Oral Daily  . hydrocortisone-pramoxine  1 applicator Rectal BID  . lactulose  30 g Oral Daily  . levothyroxine  50 mcg Oral QAC breakfast  . pantoprazole  40 mg Oral BID WC  . saccharomyces boulardii  250 mg Oral Daily  . sodium chloride  10-40 mL Intracatheter Q12H  . sodium chloride  3 mL Intravenous Q12H  . spironolactone  12.5 mg Oral Daily   Continuous Infusions: . sodium chloride 10 mL/hr at  06/07/15 1219   PRN Meds:.benzonatate, dibucaine, nitroGLYCERIN, ondansetron **OR** ondansetron (ZOFRAN) IV, oxyCODONE-acetaminophen, sodium chloride, sodium chloride Medications Prior to Admission:  Prior to Admission medications   Medication Sig Start Date End Date Taking? Authorizing Provider  benzonatate (TESSALON) 200 MG capsule Take 1 capsule (200 mg total) by mouth 3 (three) times daily as needed. 04/24/15  Yes Tonia Ghent, MD  Cholecalciferol (VITAMIN D3) 2000 UNITS capsule Take 2,000 Units by mouth every morning.    Yes Historical Provider, MD  dibucaine (NUPERCAINAL) 1 % ointment Apply topically 3 (three) times daily as needed for pain. 04/24/15  Yes Tonia Ghent, MD  DULoxetine (CYMBALTA) 30 MG capsule One capsule daily for one week, then take one capsule twice a day 05/29/15  Yes Kathrynn Ducking, MD  feeding supplement, ENSURE ENLIVE, (ENSURE ENLIVE) LIQD Take 237 mLs by mouth 3 (three) times daily between meals. 03/16/15  Yes Debbe Odea, MD  finasteride (PROSCAR) 5 MG tablet Take 5 mg by mouth daily.   Yes Historical Provider, MD  furosemide (LASIX) 40 MG tablet Take 0.5 tablets (20 mg total) by mouth daily. 04/17/15  Yes Oswald Hillock, MD  hydrocortisone (ANUSOL-HC) 2.5 % rectal cream Apply topically 2 (two) times daily. Patient taking differently: Apply topically 2 (two) times daily as needed for hemorrhoids or itching.  04/17/15  Yes Oswald Hillock, MD  levothyroxine (SYNTHROID, LEVOTHROID) 50 MCG tablet Take 1 tablet (50 mcg total) by mouth daily before breakfast. 05/19/15  Yes Donne Hazel, MD  lidocaine-prilocaine (EMLA) cream Apply 1 application topically as needed. Patient taking differently: Apply 1 application topically as needed (to port site.).  04/06/15  Yes Owens Shark, NP  LIVALO 4 MG TABS TAKE 1 TABLET AT BEDTIME 10/03/14  Yes Jolaine Artist, MD  loperamide (IMODIUM A-D) 2 MG tablet Take 1 tablet (2 mg total) by mouth 2 (two) times daily as needed for diarrhea or  loose stools. 04/24/15  Yes Tonia Ghent, MD  Multiple Vitamin (MULTIVITAMIN WITH MINERALS) TABS tablet Take 1 tablet by mouth daily.   Yes Historical Provider, MD  nitroGLYCERIN (NITROSTAT) 0.4 MG SL tablet Place  1 tablet (0.4 mg total) under the tongue every 5 (five) minutes as needed. For chest pain 05/24/15  Yes Larey Dresser, MD  OVER THE COUNTER MEDICATION Place 2 drops into both eyes 4 (four) times daily. Wash type eye drop-   Yes Historical Provider, MD  oxyCODONE-acetaminophen (ROXICET) 5-325 MG per tablet Take 1 tablet by mouth every 4 (four) hours as needed for severe pain. 03/21/15  Yes Ladell Pier, MD  pantoprazole (PROTONIX) 40 MG tablet TAKE 1 TABLET DAILY Patient taking differently: TAKE 1 TABLET TWICE DAILY 05/08/15  Yes Larey Dresser, MD  POLY-IRON 150 FORTE 834-19-6 MG-MCG-MG CAPS TAKE 1 CAPSULE TWICE A DAY 05/08/15  Yes Larey Dresser, MD  pramoxine-hydrocortisone (PROCTOCREAM-HC) 1-1 % rectal cream Place 1 application rectally 2 (two) times daily.   Yes Historical Provider, MD  predniSONE (DELTASONE) 5 MG tablet Take 5-60 mg by mouth as directed. Starting 05/22/15:  Take 12 tabs daily x 4 days, take 8 tabs daily x 4 days, take 4 tabs daily x 4 days, take 2 tabs daily x 4 days, then resume 5 mg daily.   Yes Historical Provider, MD  [place]; [place].   Yes Historical Provider, MD  Probiotic Product (PROBIOTIC & ACIDOPHILUS EX ST PO) Take 1 tablet by mouth every morning.    Yes Historical Provider, MD  prochlorperazine (COMPAZINE) 5 MG tablet Take 1 tablet (5 mg total) by mouth every 6 (six) hours as needed for nausea or vomiting. 03/21/15  Yes Ladell Pier, MD  Psyllium (METAMUCIL PO) Take 20 mLs by mouth daily as needed (constipation).    Yes Historical Provider, MD  spironolactone (ALDACTONE) 25 MG tablet Take 0.5 tablets (12.5 mg total) by mouth daily. 04/17/15  Yes Oswald Hillock, MD  fluconazole (DIFLUCAN) 100 MG tablet Take 1 tablet (100 mg total) by  mouth daily. Patient not taking: Reported on 06/01/2015 05/23/15   Owens Shark, NP  pramoxine (PROCTOFOAM) 1 % foam Place 1 application rectally 3 (three) times daily as needed for itching. Patient not taking: Reported on 06/01/2015 04/24/15   Tonia Ghent, MD  predniSONE (DELTASONE) 5 MG tablet Take 1 tablet (5 mg total) by mouth daily with breakfast. 03/31/15   Owens Shark, NP   Allergies  Allergen Reactions  . Sulfa Antibiotics Other (See Comments)    Caused patient have ITP  . Aleve [Naproxen Sodium] Other (See Comments)    Causes platlet drop  . Rofecoxib Other (See Comments)    Celebrex  - affected low platelets  . Sulfamethoxazole-Trimethoprim Other (See Comments)     Causes low platelets and bleeding  . Tape Other (See Comments)    Adhesive tape makes skin tear. Paper tape only.    . Tramadol Hcl Other (See Comments)    dizziness, drowsiness  . Neomycin-Bacitracin Zn-Polymyx Rash  . Neosporin [Neomycin-Polymyxin-Gramicidin] Rash  . Penicillins Swelling and Rash    Took dose of amoxicillin 2035m 02/02/15 at dentist office without any complications   CBC:    Component Value Date/Time   WBC 7.3 06/08/2015 0420   WBC 29.6* 05/23/2015 1005   HGB 7.9* 06/08/2015 0420   HGB 8.4* 05/23/2015 1005   HCT 25.3* 06/08/2015 0420   HCT 26.8* 05/23/2015 1005   PLT 122* 06/08/2015 0420   PLT 257 05/23/2015 1005   MCV 91.0 06/08/2015 0420   MCV 89.3 05/23/2015 1005   NEUTROABS 27.1* 05/23/2015 1005   NEUTROABS 16.1* 05/17/2015 0345   LYMPHSABS 1.0  05/23/2015 1005   LYMPHSABS 0.9 05/17/2015 0345   MONOABS 1.5* 05/23/2015 1005   MONOABS 1.1* 05/17/2015 0345   EOSABS 0.0 05/23/2015 1005   EOSABS 0.0 05/17/2015 0345   BASOSABS 0.0 05/23/2015 1005   BASOSABS 0.0 05/17/2015 0345   Comprehensive Metabolic Panel:    Component Value Date/Time   NA 132* 06/08/2015 0420   NA 137 05/23/2015 1005   K 3.7 06/08/2015 0420   K 3.9 05/23/2015 1005   CL 105 06/08/2015 0420   CO2 19*  06/08/2015 0420   CO2 17* 05/23/2015 1005   BUN 15 06/08/2015 0420   BUN 27.8* 05/23/2015 1005   CREATININE 0.52* 06/08/2015 0420   CREATININE 1.1 05/23/2015 1005   CREATININE 1.12 02/13/2015 0925   GLUCOSE 93 06/08/2015 0420   GLUCOSE 161* 05/23/2015 1005   CALCIUM 7.7* 06/08/2015 0420   CALCIUM 8.0* 05/23/2015 1005   AST 95* 06/08/2015 0420   AST 33 05/23/2015 1005   ALT 118* 06/08/2015 0420   ALT 76* 05/23/2015 1005   ALKPHOS 1035* 06/08/2015 0420   ALKPHOS 218* 05/23/2015 1005   BILITOT 13.4* 06/08/2015 0420   BILITOT 0.83 05/23/2015 1005   PROT 4.6* 06/08/2015 0420   PROT 5.4* 05/23/2015 1005   ALBUMIN 2.0* 06/08/2015 0420   ALBUMIN 2.3* 05/23/2015 1005    Physical Exam:  Vital Signs: BP 128/63 mmHg  Pulse 71  Temp(Src) 97.7 F (36.5 C) (Oral)  Resp 18  Ht 6' (1.829 m)  Wt 93.7 kg (206 lb 9.1 oz)  BMI 28.01 kg/m2  SpO2 99% SpO2: SpO2: 99 % O2 Device: O2 Device: Not Delivered O2 Flow Rate: O2 Flow Rate (L/min): 2 L/min Intake/output summary:  Intake/Output Summary (Last 24 hours) at 06/08/15 1051 Last data filed at 06/08/15 0700  Gross per 24 hour  Intake    597 ml  Output   1100 ml  Net   -503 ml   LBM: Last BM Date: 06/08/15 Baseline Weight: Weight: 93.1 kg (205 lb 4 oz) Most recent weight: Weight: 93.7 kg (206 lb 9.1 oz)  Exam Findings:   General: ill appearing, weak CVS: RRR Resp: Decreased in bases Skin: jaundiced Neuro: oriented to person and place           Palliative Performance Scale: 30 %                 Additional Data Reviewed: Recent Labs     06/06/15  0350  06/08/15  0420  WBC  7.5  7.3  HGB  9.3*  7.9*  PLT  119*  122*  NA  129*  132*  BUN  20  15  CREATININE  0.51*  0.52*     Time In: 1145 Time Out: 1345 Time Total: 120 min  Greater than 50%  of this time was spent counseling and coordinating care related to the above assessment and plan.  Discussed with  Dr  Exie Parody  Signed by: Wadie Lessen, NP  Knox Royalty, NP  06/08/2015, 10:51 AM  Please contact Palliative Medicine Team phone at [phone] for questions and concerns.   See AMION for contact information

## 2015-06-08 NOTE — Progress Notes (Signed)
Notified by Earney Hamburg of pt./family request for Hospice and [place] services at home after discharge. Chart and patient information reviewed with Dr. Brigid Re [facility] Medical Director.  Writer spoke with patient, his sister and his daughter at the bedside to initiate education related to hospice philosophy, services and team approach to care. Per discussion plan is for discharge to home by personal vehicle today.  Please send signed and completed DNR form home with pt. Pt. Will need prescriptions for discharge comfort medications.  DME needs discussed and family requesting an over the bed table. Family reports they have equipment at home including an electric hospital bed.  [facility] equipment manager Jewel Ysidro Evert notified and will contact [facility] to arrange delivery to the home tomorrow. The home address has been verified and [name] is the contact person to arrange time of delivery.  [facility] aware of above. [facility] information and contact numvers given to pt's dtr. during visit. Above information shared with Juliann Pulse, [facility]. Please call with any questions.  [facility] Liaison [phone]

## 2015-06-08 NOTE — Progress Notes (Signed)
Nutrition Follow-up  DOCUMENTATION CODES:  Not applicable  INTERVENTION: - Continue Ensure Enlive and [employer], both BID - RD will continue to monitor for needs  NUTRITION DIAGNOSIS:  Inadequate oral intake related to inability to eat as evidenced by NPO status. -resolving with 20-100% intakes  GOAL:  Patient will meet greater than or equal to 90% of their needs -unmet  MONITOR:  Diet advancement, Labs, Weight trends  ASSESSMENT: 81 y.o. male with history of hypertension hyperlipidemia chronic anemia small bowel cancer esophageal varices presents with GI bleed. He had a duodenal tumor which is being followed by oncology. Patient also has a history of portal vein clot possibly. He had a PET scan done which shows multiple nodal involvement and also with second portion of the duodenum involved suggestive of adenocarcinoma.  6/16: - PT working with pt x2 attempted visit and pt being cleaned and moved to chair during subsequent attempt - Continue to be unable to perform physical assessment - Pt NPO during last assessment with diet advanced to Regular 6/14 and pt eating 20-100% since that time - Unsure of supplement use - Likely not consistently meeting needs - Medications reviewed  Labs: CBGs: 92-141 mg/dL, Na: 132 mmol/L, Ca: 7.7 mg/dL, LFTs elevated   6/10: - Pt asleep during RD visit. Daughter requested that he not be woken up.  - Nutritional history obtained from daughter, medical chart, and RN.  - Pt lost 85-90 lbs within the past year, but has started to gain weight back over the past couple of weeks due to increased po intake. Pt has been drinking Ensure Enlive at home three times daily. Daughter requested Ensure Jeanne Ivan and [employer] during hospital stay.  - Pt also with history of oral thrush which has since cleared up.  - Nutrition-focused physical exam not completed at this time per family request.  - RD to order nutritional supplements.   Height:  Ht  Readings from Last 1 Encounters:  06/01/15 6' (1.829 m)    Weight:  Wt Readings from Last 1 Encounters:  06/08/15 206 lb 9.1 oz (93.7 kg)    Ideal Body Weight:  80.9 kg  Wt Readings from Last 10 Encounters:  06/08/15 206 lb 9.1 oz (93.7 kg)  05/29/15 204 lb (92.534 kg)  05/25/15 204 lb (92.534 kg)  05/24/15 206 lb 8 oz (93.668 kg)  05/23/15 204 lb (92.534 kg)  05/19/15 214 lb 11.7 oz (97.4 kg)  05/09/15 198 lb (89.812 kg)  04/24/15 218 lb 12 oz (99.224 kg)  04/17/15 209 lb (94.802 kg)  04/05/15 207 lb (93.895 kg)    BMI:  Body mass index is 28.01 kg/(m^2).  Estimated Nutritional Needs:  Kcal:  2200-2400  Protein:  120-130 g  Fluid:  2.3 L/day  Skin:  Wound (see comment) (deep tissue injury on sacrum)  Diet Order:  Diet regular Room service appropriate?: Yes; Fluid consistency:: Thin  EDUCATION NEEDS:  Education needs addressed   Intake/Output Summary (Last 24 hours) at 06/08/15 0942 Last data filed at 06/08/15 0700  Gross per 24 hour  Intake    597 ml  Output   1100 ml  Net   -503 ml    Last BM:  6/15   Jarome Matin, RD, LDN Inpatient Clinical Dietitian Pager # [phone] After hours/weekend pager # [phone]

## 2015-06-08 NOTE — Progress Notes (Signed)
Completed D/C teaching with patient and family. . Pt will be discharged home with hospice. Hospice will consult with the family on 6/17. Answered all questions. Pt will be transported home via family.  James [name]

## 2015-06-09 ENCOUNTER — Telehealth: Payer: Self-pay | Admitting: Nurse Practitioner

## 2015-06-09 NOTE — Telephone Encounter (Signed)
Spoke with pam and she is aware of the appointments

## 2015-06-12 IMAGING — CR DG CHEST 1V PORT
1 series · 1 of 1 positions shown · non-contrast
Comparison: 03/01/2015

CLINICAL DATA: Postop from Port-A-Cath placement. Duodenal
adenocarcinoma.

EXAM:
PORTABLE CHEST - 1 VIEW

[AP]
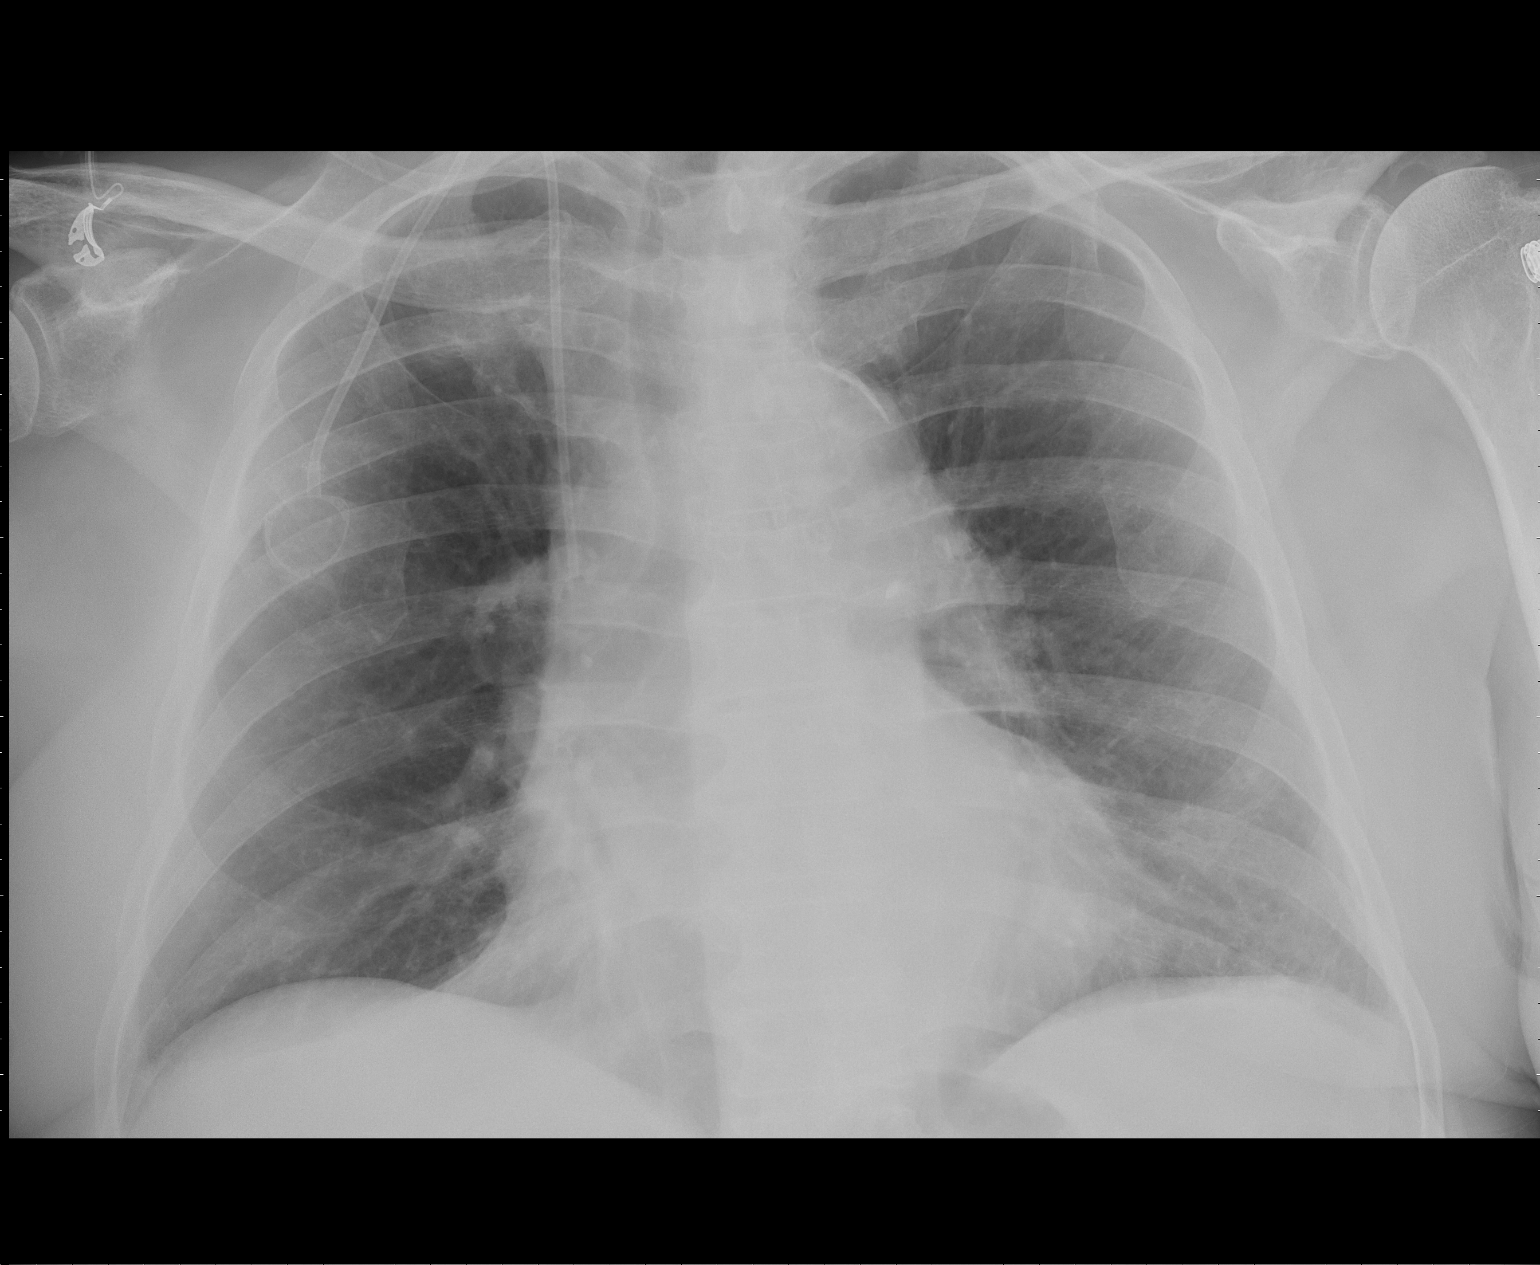

[1 of 1 positions shown; findings below may reference images not displayed]

FINDINGS: A right-sided Port-A-Cath is seen with tip overlying the distal SVC.
No pneumothorax identified.

Both lungs are clear. Heart size is within normal limits. Mild
tortuosity and atherosclerotic calcification of thoracic aorta
appears stable.
IMPRESSION: Right-sided Port-A-Cath tip in distal SVC. No evidence of
pneumothorax or other acute findings.

## 2015-06-13 ENCOUNTER — Ambulatory Visit (HOSPITAL_BASED_OUTPATIENT_CLINIC_OR_DEPARTMENT_OTHER): Admitting: Nurse Practitioner

## 2015-06-13 ENCOUNTER — Other Ambulatory Visit (HOSPITAL_BASED_OUTPATIENT_CLINIC_OR_DEPARTMENT_OTHER)

## 2015-06-13 ENCOUNTER — Telehealth: Payer: Self-pay | Admitting: Oncology

## 2015-06-13 VITALS — BP 102/55 | HR 89 | Temp 98.4°F | Resp 18 | Ht 72.0 in

## 2015-06-13 DIAGNOSIS — D638 Anemia in other chronic diseases classified elsewhere: Secondary | ICD-10-CM

## 2015-06-13 DIAGNOSIS — K831 Obstruction of bile duct: Secondary | ICD-10-CM

## 2015-06-13 DIAGNOSIS — I82403 Acute embolism and thrombosis of unspecified deep veins of lower extremity, bilateral: Secondary | ICD-10-CM

## 2015-06-13 DIAGNOSIS — C787 Secondary malignant neoplasm of liver and intrahepatic bile duct: Secondary | ICD-10-CM

## 2015-06-13 DIAGNOSIS — C170 Malignant neoplasm of duodenum: Secondary | ICD-10-CM

## 2015-06-13 DIAGNOSIS — G893 Neoplasm related pain (acute) (chronic): Secondary | ICD-10-CM

## 2015-06-13 DIAGNOSIS — D500 Iron deficiency anemia secondary to blood loss (chronic): Secondary | ICD-10-CM

## 2015-06-13 DIAGNOSIS — D6481 Anemia due to antineoplastic chemotherapy: Secondary | ICD-10-CM | POA: Diagnosis not present

## 2015-06-13 DIAGNOSIS — R17 Unspecified jaundice: Secondary | ICD-10-CM

## 2015-06-13 LAB — CBC WITH DIFFERENTIAL/PLATELET
BASO%: 0.2 % (ref 0.0–2.0)
Basophils Absolute: 0 10*3/uL (ref 0.0–0.1)
EOS ABS: 0 10*3/uL (ref 0.0–0.5)
EOS%: 0.4 % (ref 0.0–7.0)
HCT: 29.2 % — ABNORMAL LOW (ref 38.4–49.9)
HGB: 9.3 g/dL — ABNORMAL LOW (ref 13.0–17.1)
LYMPH#: 0.7 10*3/uL — AB (ref 0.9–3.3)
LYMPH%: 7.1 % — ABNORMAL LOW (ref 14.0–49.0)
MCH: 29.4 pg (ref 27.2–33.4)
MCHC: 31.8 g/dL — ABNORMAL LOW (ref 32.0–36.0)
MCV: 92.4 fL (ref 79.3–98.0)
MONO#: 0.8 10*3/uL (ref 0.1–0.9)
MONO%: 8.5 % (ref 0.0–14.0)
NEUT#: 7.7 10*3/uL — ABNORMAL HIGH (ref 1.5–6.5)
NEUT%: 83.8 % — ABNORMAL HIGH (ref 39.0–75.0)
Platelets: 308 10*3/uL (ref 140–400)
RBC: 3.16 10*6/uL — AB (ref 4.20–5.82)
RDW: 19.4 % — AB (ref 11.0–14.6)
WBC: 9.2 10*3/uL (ref 4.0–10.3)
nRBC: 0 % (ref 0–0)

## 2015-06-13 LAB — COMPREHENSIVE METABOLIC PANEL (CC13)
ALBUMIN: 1.8 g/dL — AB (ref 3.5–5.0)
ALT: 168 U/L — ABNORMAL HIGH (ref 0–55)
AST: 144 U/L — ABNORMAL HIGH (ref 5–34)
Alkaline Phosphatase: 1842 U/L — ABNORMAL HIGH (ref 40–150)
Anion Gap: 10 mEq/L (ref 3–11)
BUN: 19 mg/dL (ref 7.0–26.0)
CALCIUM: 8.3 mg/dL — AB (ref 8.4–10.4)
CHLORIDE: 98 meq/L (ref 98–109)
CO2: 20 mEq/L — ABNORMAL LOW (ref 22–29)
CREATININE: 1 mg/dL (ref 0.7–1.3)
EGFR: 70 mL/min/{1.73_m2} — AB (ref >90)
Glucose: 141 mg/dl — ABNORMAL HIGH (ref 70–140)
Potassium: 4.5 mEq/L (ref 3.5–5.1)
Sodium: 128 mEq/L — ABNORMAL LOW (ref 136–145)
Total Bilirubin: 21.51 mg/dL (ref 0.20–1.20)
Total Protein: 5.1 g/dL — ABNORMAL LOW (ref 6.4–8.3)

## 2015-06-13 LAB — TECHNOLOGIST REVIEW

## 2015-06-13 LAB — HOLD TUBE, BLOOD BANK

## 2015-06-13 NOTE — Progress Notes (Addendum)
James OFFICE PROGRESS NOTE   Diagnosis:  Small bowel [name]  INTERVAL HISTORY:   James [name] returns for follow-up since his recent discharge from the hospital. He is enrolled in the [facility] hospice program. He is weak. Someone is with him at all times. He denies pain. No nausea or vomiting. Bowels are moving. Stools are loose but he is not having significant diarrhea. Appetite is poor. His daughter reports adequate fluid intake. No known bleeding.  Objective:  Vital signs in last 24 hours:  Blood pressure 102/55, pulse 89, temperature 98.4 F (36.9 C), temperature source Oral, resp. rate 18, height 6' (1.829 m), SpO2 99 %.   Markedly jaundiced, chronically ill elderly man. HEENT: No thrush or ulcers. Resp: Lungs clear. Cardio: Distant heart sounds. GI: Abdomen is distended. Nontender. Vascular: Pitting edema at the lower legs bilaterally left greater than right. Neuro: He is alert. Follows commands.  Skin: Jaundice. 2 areas of superficial ulceration at the mid to upper gluteal fold. Port-A-Cath without erythema.  Lab Results:  Lab Results  Component Value Date   WBC 9.2 06/13/2015   HGB 9.3* 06/13/2015   HCT 29.2* 06/13/2015   MCV 92.4 06/13/2015   PLT 308 06/13/2015   NEUTROABS 7.7* 06/13/2015    Imaging:  No results found.  Medications: I have reviewed the patient's current medications.  Assessment/Plan: 1. Duodenal mass, abdominal/retroperitoneal lymphadenopathy, and liver metastases noted on a CT 03/01/2015  Upper endoscopy 03/03/2015 confirmed a duodenal mass, status post a biopsy with the final pathology confirming a [name], immunohistochemical stains negative for lymphoma, neuroendocrine [name], and melanoma markers. Positive staining for CDX2, cytokeratin AE1/AE3, and CD10  PET scan 03/29/2015 showed a large intensely hypermetabolic mass circumferentially narrowing the second portion of the duodenum. Adjacent hypermetabolic  lymph nodes medial to the duodenum mass. Irregular hypermetabolic nodal mass in the inferior aspect of the gastrohepatic ligament. Hypermetabolic nodal metastasis in the retroperitoneum left of the aorta. No abnormal metabolic activity within the enlarged inferior right lobe of the liver.  Cycle 1 FOLFOX 04/11/2015  Cycle 2 FOLFOX 04/25/2015  2. Gastric outlet obstruction secondary to #1, status post a palliative gastrojejunostomy 03/09/2015  3. 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 Portal vein thrombosis diagnosed in September 2015. Off of anticoagulation due to GI bleeding.  5. Left leg DVT December 2014. Off of anticoagulation due to GI bleeding.  Bilateral lower extremity DVTs confirmed on a Doppler 06/02/2015, status post IVC filter placement  6. History of coronary artery disease, status post an LAD drug-eluting stent July 2015  7. Remote history of melanoma of the right ear  8. 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 History of drug related ITP  10. Abdominal pain secondary to the duodenal mass/liver metastases/adenopathy  11. Neuropathy  12. History of Altered mental status- secondary to lorazepam versus hepatic encephalopathy  13. External hemorrhoid-referred to Dr. Excell Seltzer  14. Admission 05/15/2015 with a fever and altered mental status-potentially related to a systemic infection, one blood culture positive for "gram variable rods "and urine streptococcus pneumoniae antigen positive. Discharged 05/20/2015.  15. Admission 06/01/2015 with symptomatic anemia, melena. Seen by GI. Bleeding due to GI tumor not amenable to  endoscopic therapy. EGD not  recommended. Found to have bilateral lower extremity DVTs. IVC filter placed. Not a candidate for anticoagulation. Discharged home with hospice 06/08/2015.  Disposition: James [name] performance status continues to decline. He is now enrolled in the [facility] hospice program. The plan is to continue supportive/comfort care. His daughter requests to check a CBC in approximately 10 days. She will asked the hospice nurse to obtain with results to be forwarded to our office. He will return for a follow-up visit in approximately 3 weeks. They will contact the office prior to that visit with any problems.  Patient seen with Dr. Benay Spice. 25 minutes were spent face-to-face at today's visit with the majority of that time involved in counseling/coordination of care.  Ned Card ANP/GNP-BC   06/13/2015  3:26 PM  This was a shared visit with Ned Card. James [name] was interviewed and examined. We discussed his current status with his daughter. The plan is to continue comfort care measures via the [facility] program. He has progressive jaundice secondary to intrahepatic and extrahepatic biliary obstruction by tumor.  Julieanne Manson, M.D.

## 2015-06-13 NOTE — Telephone Encounter (Signed)
Gave and printed appt sched nd avs for pt for July

## 2015-06-14 ENCOUNTER — Telehealth: Payer: Self-pay | Admitting: *Deleted

## 2015-06-14 NOTE — Telephone Encounter (Signed)
Received phone call from [facility] Nurse stating she examined patient's bed sore and it is improving with cream prescribed.  Also, hospice nurse stated that labs ordered will be drawn 6/29

## 2015-06-14 NOTE — Telephone Encounter (Signed)
Left message with Hospice nurse to examine patient's bed sore.  Per Elby Showers.Marcello Moores, NP.

## 2015-06-15 ENCOUNTER — Telehealth: Payer: Self-pay | Admitting: *Deleted

## 2015-06-15 NOTE — Telephone Encounter (Signed)
Left message with Hospice nurse of [place] Judeen Hammans) OK per Dr. Benay Spice to draw labs on 6/29

## 2015-06-19 ENCOUNTER — Other Ambulatory Visit: Payer: Self-pay | Admitting: *Deleted

## 2015-06-19 ENCOUNTER — Ambulatory Visit (HOSPITAL_BASED_OUTPATIENT_CLINIC_OR_DEPARTMENT_OTHER)

## 2015-06-19 ENCOUNTER — Telehealth: Payer: Self-pay | Admitting: *Deleted

## 2015-06-19 DIAGNOSIS — C787 Secondary malignant neoplasm of liver and intrahepatic bile duct: Secondary | ICD-10-CM | POA: Diagnosis not present

## 2015-06-19 DIAGNOSIS — C170 Malignant neoplasm of duodenum: Secondary | ICD-10-CM

## 2015-06-19 LAB — CBC WITH DIFFERENTIAL/PLATELET
BASO%: 0.1 % (ref 0.0–2.0)
Basophils Absolute: 0 10*3/uL (ref 0.0–0.1)
EOS%: 0 % (ref 0.0–7.0)
Eosinophils Absolute: 0 10*3/uL (ref 0.0–0.5)
HCT: 25.5 % — ABNORMAL LOW (ref 38.4–49.9)
HGB: 7.8 g/dL — ABNORMAL LOW (ref 13.0–17.1)
LYMPH%: 5.7 % — AB (ref 14.0–49.0)
MCH: 28.7 pg (ref 27.2–33.4)
MCHC: 30.6 g/dL — AB (ref 32.0–36.0)
MCV: 93.8 fL (ref 79.3–98.0)
MONO#: 1.2 10*3/uL — ABNORMAL HIGH (ref 0.1–0.9)
MONO%: 6.9 % (ref 0.0–14.0)
NEUT#: 15 10*3/uL — ABNORMAL HIGH (ref 1.5–6.5)
NEUT%: 87.3 % — AB (ref 39.0–75.0)
Platelets: 376 10*3/uL (ref 140–400)
RBC: 2.72 10*6/uL — ABNORMAL LOW (ref 4.20–5.82)
RDW: 19.3 % — ABNORMAL HIGH (ref 11.0–14.6)
WBC: 17.2 10*3/uL — ABNORMAL HIGH (ref 4.0–10.3)
lymph#: 1 10*3/uL (ref 0.9–3.3)
nRBC: 0 % (ref 0–0)

## 2015-06-19 LAB — TECHNOLOGIST REVIEW

## 2015-06-19 IMAGING — CT CT HEAD W/O CM
2 series · 16 of 30 positions shown, 19 images · non-contrast
Comparison: Prior PET-CT 03/29/2015; prior brain MRI 01/06/2013;
prior head CT 01/16/2011

CLINICAL DATA: 86-year-old male with altered mental status. Medical
history includes gastric cancer metastatic to the liver and ongoing
chemotherapy.

EXAM:
CT HEAD WITHOUT CONTRAST
TECHNIQUE: Contiguous axial images were obtained from the base of the skull
through the vertex without intravenous contrast.

[Series 2: head w/o · axial · non-contrast · 0.42mm/px · z∈[-164,-39]mm · 9 of 33 slices shown, 12 images]
[im 4/33  brain]
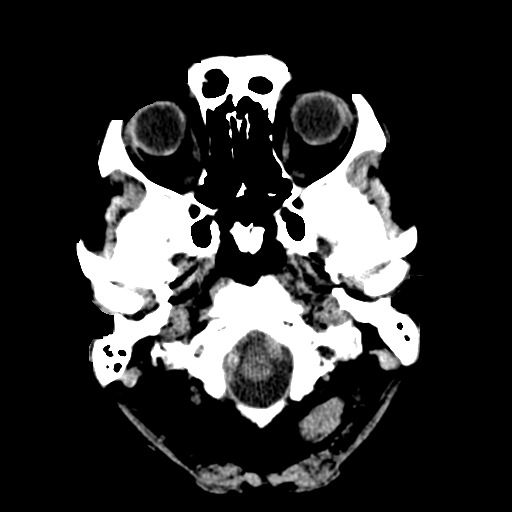
[im 4/33  bone]
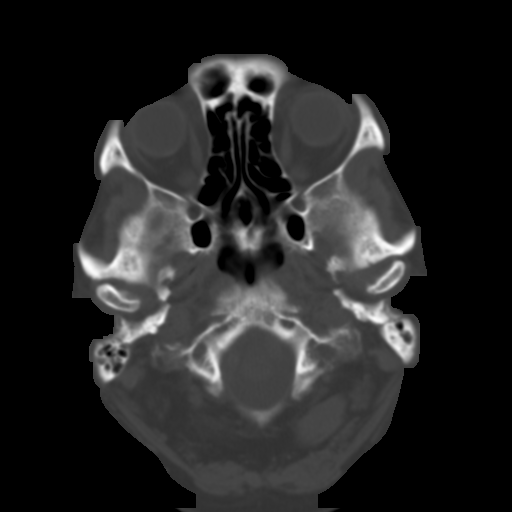
[im 7/33  brain]
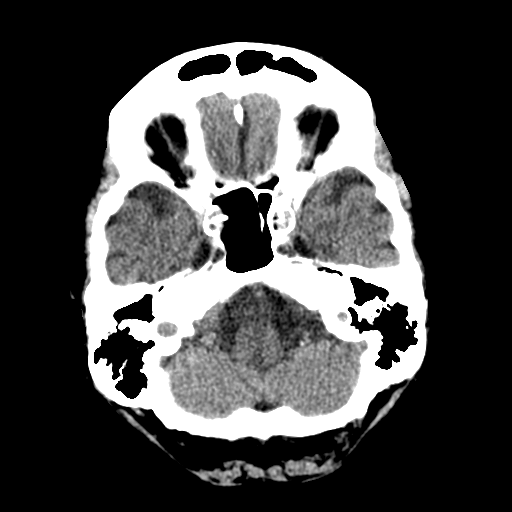
[im 10/33  brain]
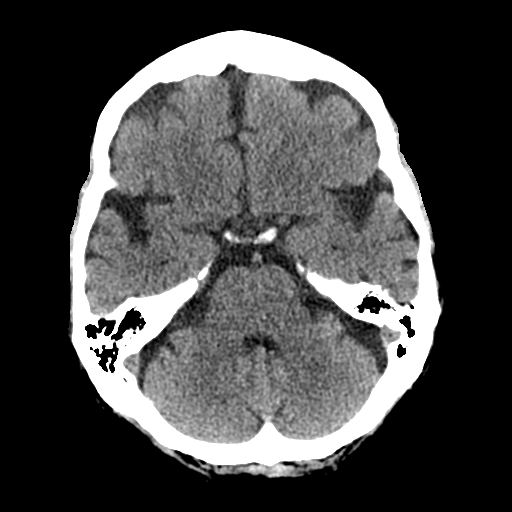
[im 13/33  brain]
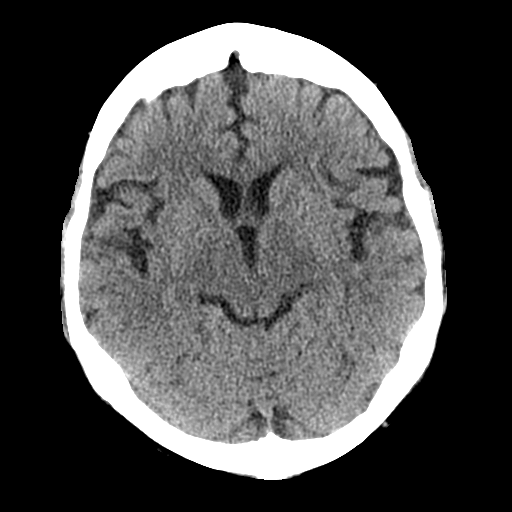
[im 17/33  brain]
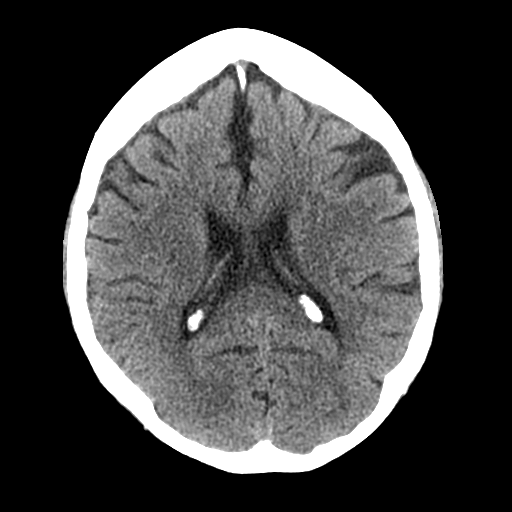
[im 17/33  bone]
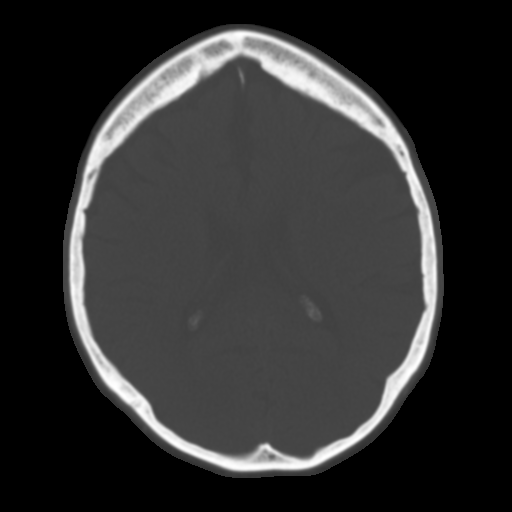
[im 20/33  brain]
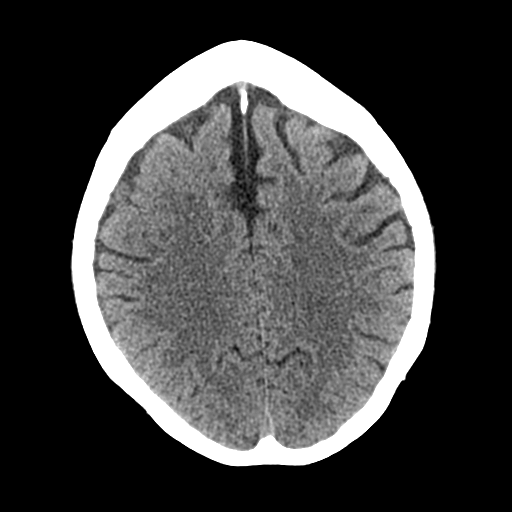
[im 23/33  brain]
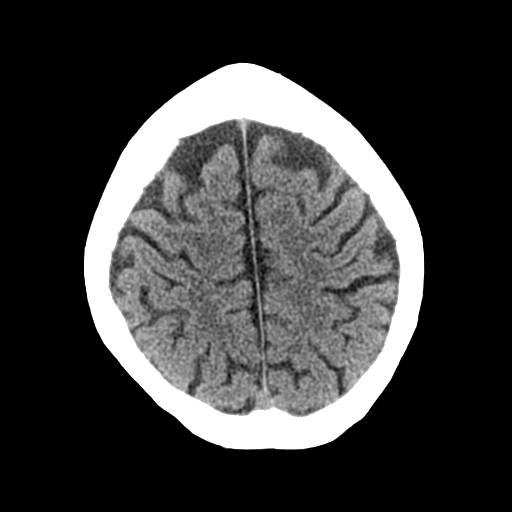
[im 26/33  brain]
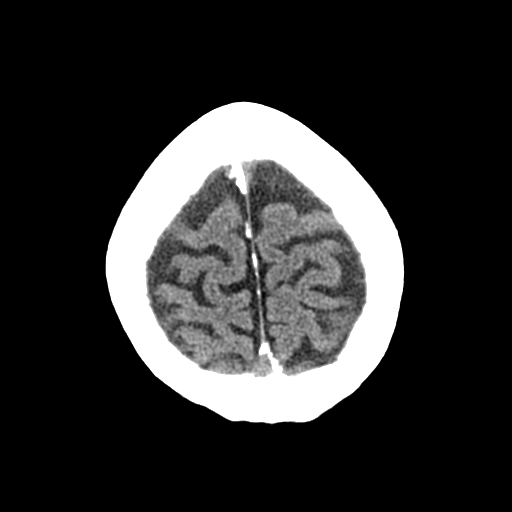
[im 29/33  brain]
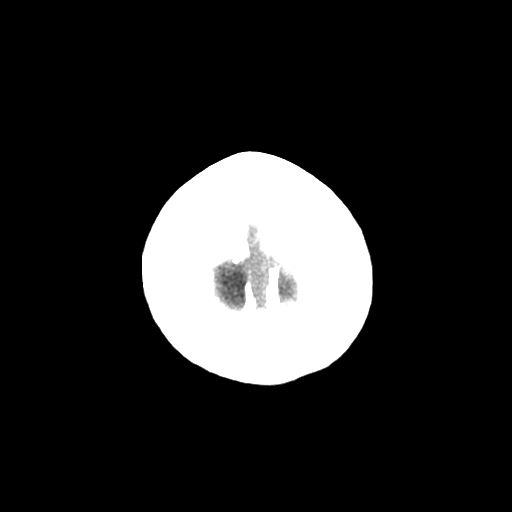
[im 29/33  bone]
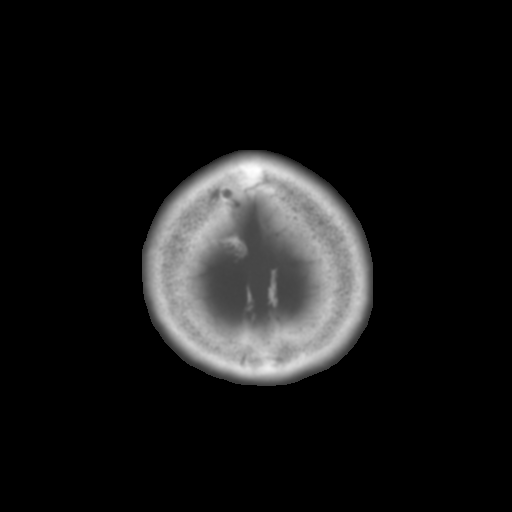

[Series 3: bone windows · axial · 0.42mm/px · z∈[-164,-56]mm · 7 of 54 slices shown]
[im 6/54  bone]
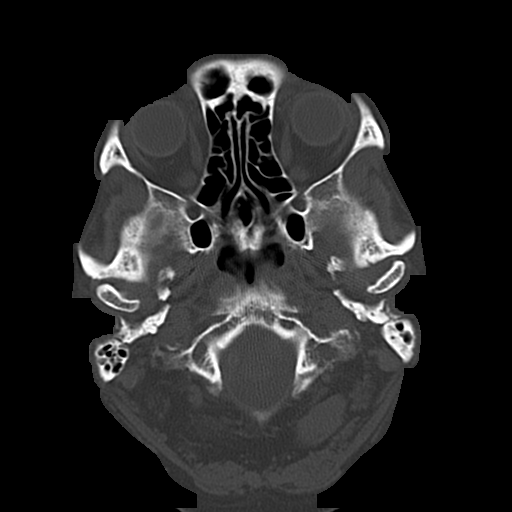
[im 12/54  bone]
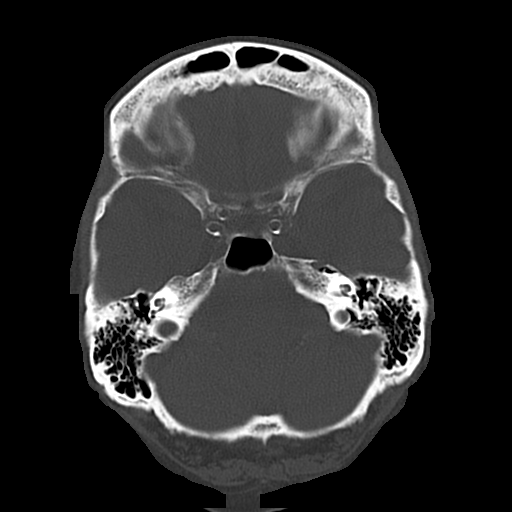
[im 18/54  bone]
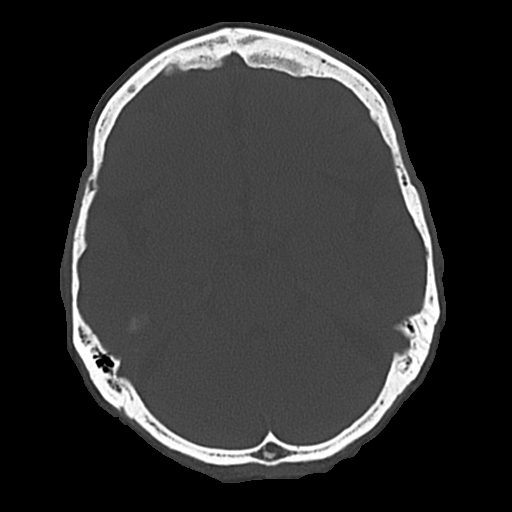
[im 24/54  bone]
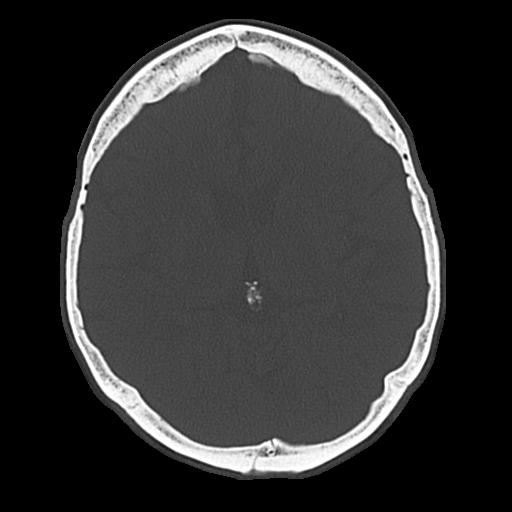
[im 30/54  bone]
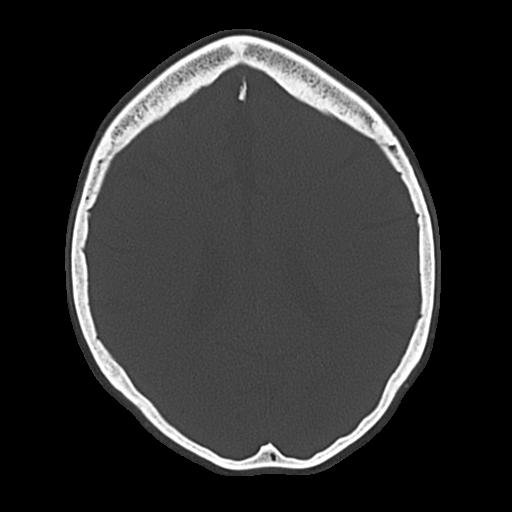
[im 36/54  bone]
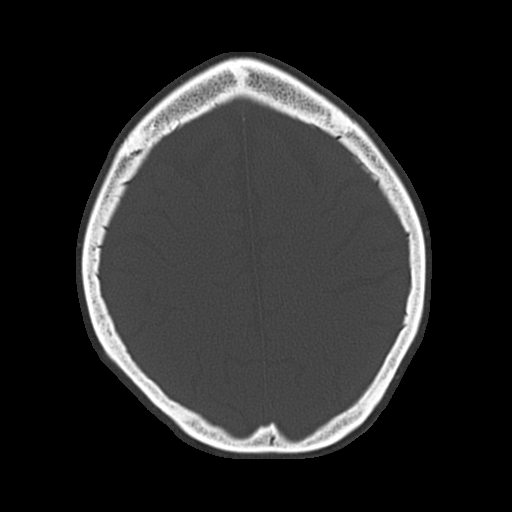
[im 42/54  bone]
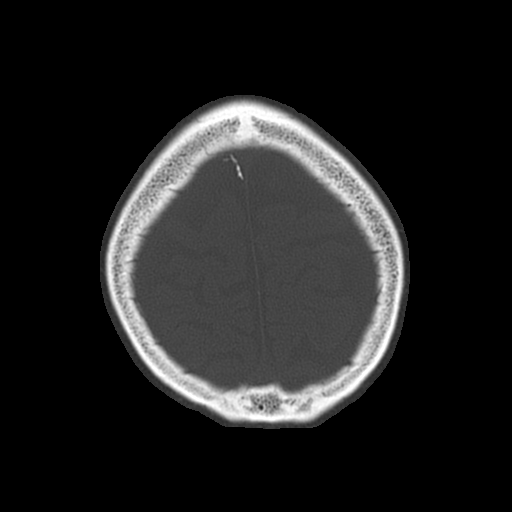

[16 of 30 positions shown; findings below may reference images not displayed]

FINDINGS: Negative for acute intracranial hemorrhage, acute infarction, mass,
mass effect, hydrocephalus or midline shift. Gray-white
differentiation is preserved throughout. Cerebral and cerebellar
cortical volume loss. Mild chronic microvascular ischemic white
matter disease. No focal soft tissue or calvarial abnormality.
Symmetric globes bilaterally. Normal aeration of the mastoid air
cells paranasal sinuses. Mild hyperostosis frontalis interna.
Intracranial atherosclerosis.
IMPRESSION: No acute intracranial abnormality.

Stable atrophy and mild chronic microvascular ischemic white matter
disease.

## 2015-06-19 NOTE — Telephone Encounter (Signed)
Sherri RN with Hospice called "Patient's daughter insisted labs be drawn today and they were.  Asks if Dr. Gearldine Shown decided to do IVF or any new orders.  Patient is obtunded, sleepy and weak Daughter thinks he's dehydrated and needs IVF.  Port-a-cath accessed for lab draw and remains accessed."  Dr. Benay Spice notified at this time of lab draw and symptoms.  No new orders.  Sherri transferred to Dr. Benay Spice.

## 2015-06-20 ENCOUNTER — Telehealth: Payer: Self-pay | Admitting: *Deleted

## 2015-06-20 NOTE — Telephone Encounter (Signed)
Received VM from daughter, Jeannene Patella, stating she spoke with pt and they collectively decided that the pt needed IVFs. Per Dr. Benay Spice, okay to give NS infusion, 500 mL daily x 3 days via PAC, rate to be as tolerated by patient. Spoke with Mariel Kansky, nurse manager at [facility] and gave orders. Order faxed to [facility] per Jan's request. Returned call back to [facility] with plan; she appreciated call and knows to call us with any other questions or concerns.

## 2015-06-20 NOTE — Telephone Encounter (Signed)
VM message from patient's hospice nurse, Butch Penny. She states she has started the IVF's on [name]. During the time she was there, he vomited a small amount of bright red blood, he is also very jaundiced, lethargic but wakes to verbal stimuli. He has periods of restlessness but family has refused any ativan etc as he has some family coming in this afternoon and [name] ,his daughter, wants him to be as alert as possible.  Butch Penny, the Hospice nurse, believes him to be close to actively dying and talked to the family at length about what to potentially expect.  Butch Penny states family understands the terminal nature of his illness.No other needs identified  At this time.

## 2015-06-21 ENCOUNTER — Telehealth: Payer: Self-pay | Admitting: Family Medicine

## 2015-06-21 ENCOUNTER — Telehealth: Payer: Self-pay | Admitting: Oncology

## 2015-06-21 ENCOUNTER — Telehealth: Payer: Self-pay | Admitting: *Deleted

## 2015-06-23 NOTE — Telephone Encounter (Signed)
Received a call from [facility] to inform me that the pt was deceased and asked me to cancel his future appts. i let Charisse Klinefelter RN know

## 2015-06-23 NOTE — Telephone Encounter (Signed)
Called to patient's house to check on patient.  He had died this AM.  I didn't know that before I called.  His sister answered, thanked me for the call.   Please update chart.  I was always glad to see this kind gentleman.

## 2015-06-23 NOTE — Telephone Encounter (Signed)
Recd call from [facility] advising pt passed away this morning, stated if he had any appointments to please cancel. S/w NP/LT concerning death of patient and also sent msg to MD concerning matter. KJ

## 2015-06-23 DEATH — deceased

## 2015-06-24 IMAGING — US US ABDOMEN LIMITED
1 series · 4 of 4 positions shown · non-contrast
Comparison: None.

CLINICAL DATA: Duodenal adenocarcinoma.  Ascites.

EXAM:
LIMITED ABDOMEN ULTRASOUND FOR ASCITES
TECHNIQUE: Limited ultrasound survey for ascites was performed in all four
abdominal quadrants.

[Series 1: us abdomen limited · 0.27mm/px · 4 of 4 slices shown]
[im 1/4]
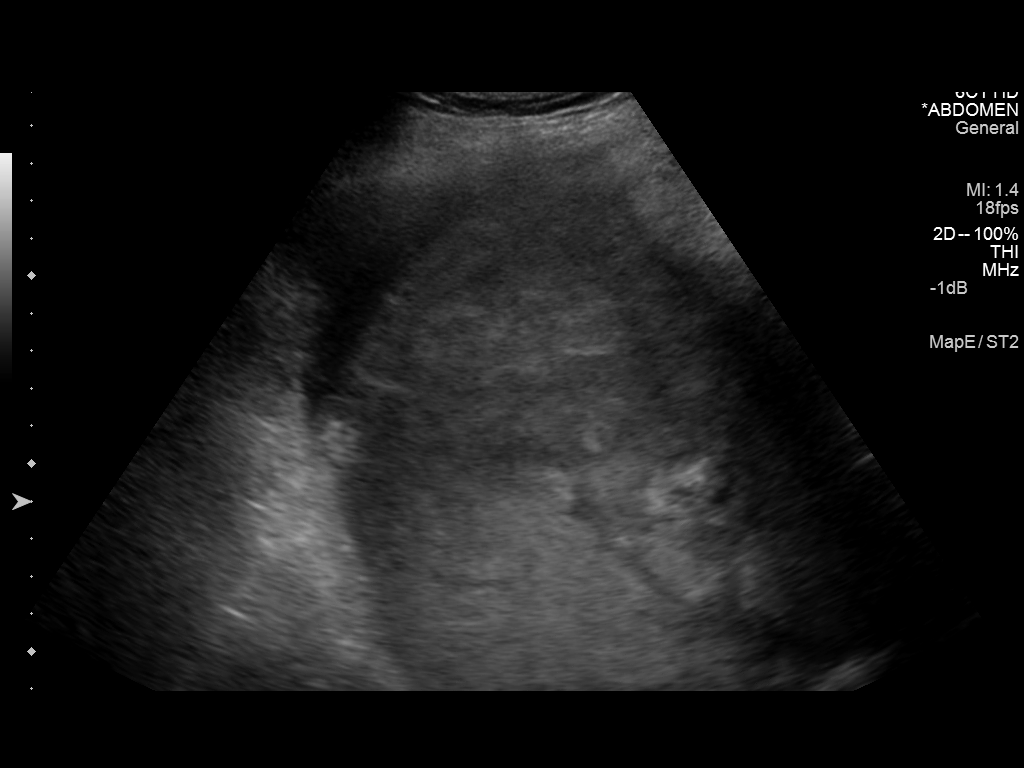
[im 2/4]
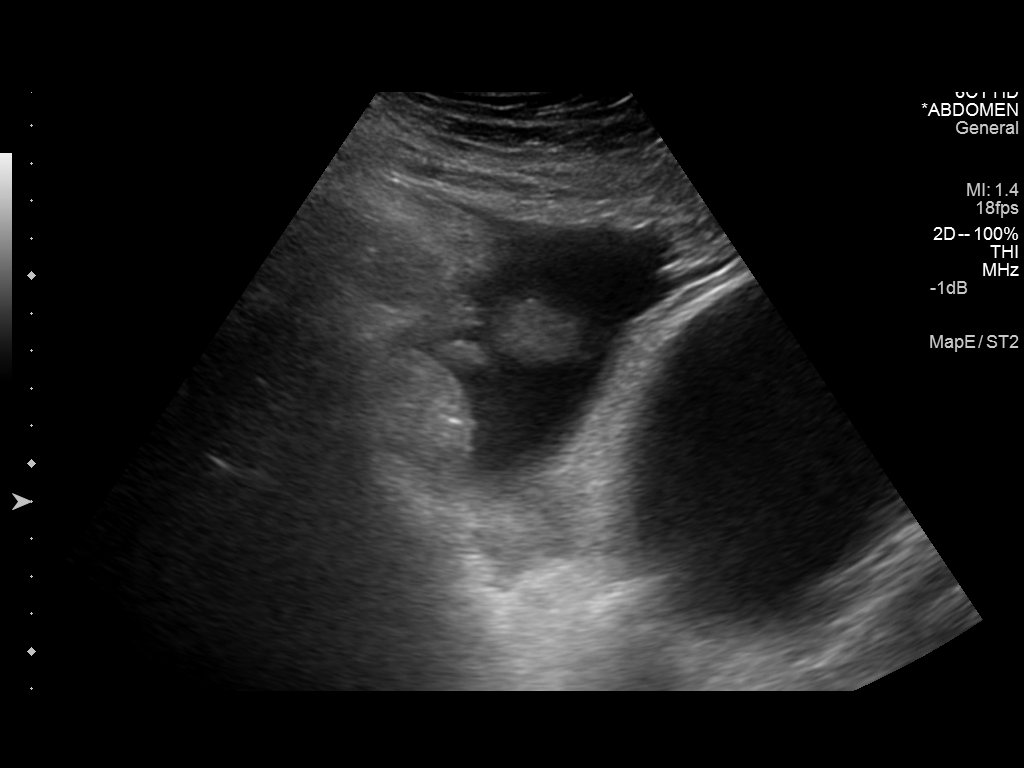
[im 3/4]
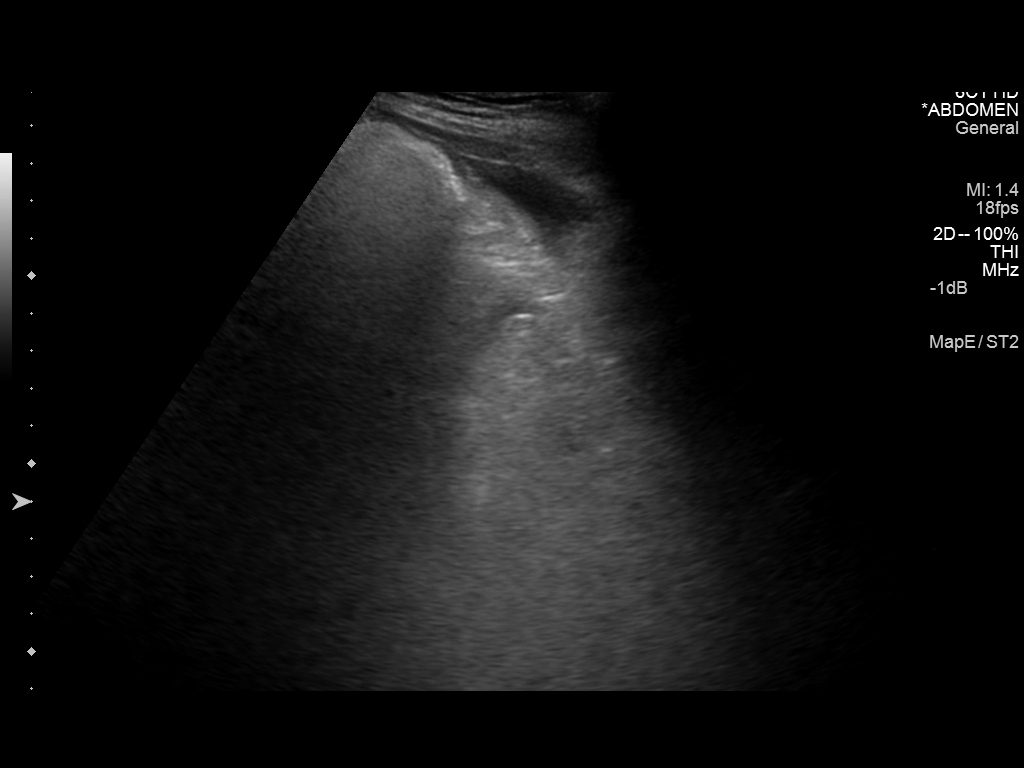
[im 4/4]
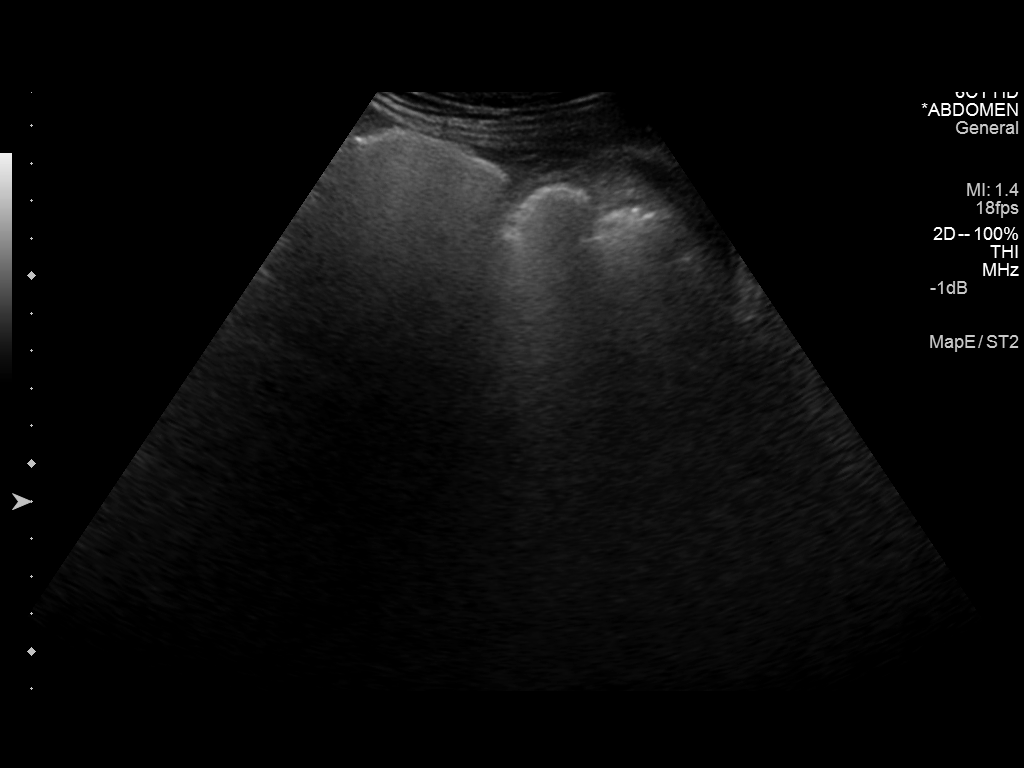

[4 of 4 positions shown; findings below may reference images not displayed]

FINDINGS: A very small amount of ascites is seen in both lower quadrants in
the right upper quadrant. This is felt to be insufficient amount for
paracentesis.
IMPRESSION: Small amount of ascites seen in 3 of 4 abdominal quadrants.

## 2015-06-27 ENCOUNTER — Telehealth: Payer: Self-pay | Admitting: Oncology

## 2015-06-27 NOTE — Telephone Encounter (Signed)
Received death certificate 06/27/15 °

## 2015-06-30 ENCOUNTER — Other Ambulatory Visit: Payer: TRICARE For Life (TFL)

## 2015-06-30 ENCOUNTER — Ambulatory Visit: Admitting: Nurse Practitioner

## 2015-06-30 ENCOUNTER — Other Ambulatory Visit

## 2015-07-24 DEATH — deceased

## 2015-08-21 ENCOUNTER — Other Ambulatory Visit: Payer: Self-pay | Admitting: Internal Medicine

## 2015-08-28 ENCOUNTER — Other Ambulatory Visit (HOSPITAL_COMMUNITY): Payer: Self-pay | Admitting: Internal Medicine

## 2015-09-05 ENCOUNTER — Other Ambulatory Visit: Payer: Self-pay | Admitting: Family Medicine

## 2015-10-03 ENCOUNTER — Ambulatory Visit: Payer: Medicare Other | Admitting: Neurology

## 2015-12-07 ENCOUNTER — Ambulatory Visit: Payer: Medicare Other | Admitting: Adult Health

## 2016-06-07 ENCOUNTER — Other Ambulatory Visit: Payer: Self-pay | Admitting: Nurse Practitioner

## 2016-06-10 ENCOUNTER — Other Ambulatory Visit: Payer: Self-pay | Admitting: Nurse Practitioner
